# Patient Record
Sex: Female | Born: 1935 | Race: White | Hispanic: No | Smoking: Never smoker
Health system: Southern US, Community
[De-identification: ages and names within clinical notes are randomized; demographics above are authoritative.]

## PROBLEM LIST (undated history)

## (undated) DIAGNOSIS — F0390 Unspecified dementia without behavioral disturbance: Secondary | ICD-10-CM

## (undated) DIAGNOSIS — H353231 Exudative age-related macular degeneration, bilateral, with active choroidal neovascularization: Secondary | ICD-10-CM

## (undated) DIAGNOSIS — I471 Supraventricular tachycardia, unspecified: Secondary | ICD-10-CM

## (undated) DIAGNOSIS — U071 COVID-19: Secondary | ICD-10-CM

## (undated) DIAGNOSIS — I639 Cerebral infarction, unspecified: Secondary | ICD-10-CM

## (undated) DIAGNOSIS — I10 Essential (primary) hypertension: Secondary | ICD-10-CM

## (undated) DIAGNOSIS — I5189 Other ill-defined heart diseases: Secondary | ICD-10-CM

## (undated) DIAGNOSIS — E785 Hyperlipidemia, unspecified: Secondary | ICD-10-CM

## (undated) DIAGNOSIS — I4892 Unspecified atrial flutter: Secondary | ICD-10-CM

## (undated) HISTORY — DX: Unspecified atrial flutter: I48.92

## (undated) HISTORY — DX: Other ill-defined heart diseases: I51.89

## (undated) HISTORY — DX: Essential (primary) hypertension: I10

## (undated) HISTORY — DX: Hyperlipidemia, unspecified: E78.5

## (undated) HISTORY — DX: Unspecified dementia, unspecified severity, without behavioral disturbance, psychotic disturbance, mood disturbance, and anxiety: F03.90

## (undated) HISTORY — DX: Cerebral infarction, unspecified: I63.9

## (undated) HISTORY — DX: Supraventricular tachycardia: I47.1

## (undated) HISTORY — DX: Supraventricular tachycardia, unspecified: I47.10

---

## 1998-07-13 ENCOUNTER — Other Ambulatory Visit: Admission: RE | Admit: 1998-07-13 | Discharge: 1998-07-13 | Payer: Self-pay | Admitting: Obstetrics and Gynecology

## 1998-09-27 ENCOUNTER — Encounter (INDEPENDENT_AMBULATORY_CARE_PROVIDER_SITE_OTHER): Payer: Self-pay | Admitting: Specialist

## 1998-09-27 ENCOUNTER — Ambulatory Visit (HOSPITAL_COMMUNITY): Admission: RE | Admit: 1998-09-27 | Discharge: 1998-09-27 | Payer: Self-pay | Admitting: Internal Medicine

## 1999-07-25 ENCOUNTER — Encounter: Payer: Self-pay | Admitting: Obstetrics and Gynecology

## 1999-07-25 ENCOUNTER — Encounter: Admission: RE | Admit: 1999-07-25 | Discharge: 1999-07-25 | Payer: Self-pay | Admitting: Obstetrics and Gynecology

## 1999-08-14 ENCOUNTER — Other Ambulatory Visit: Admission: RE | Admit: 1999-08-14 | Discharge: 1999-08-14 | Payer: Self-pay | Admitting: Obstetrics and Gynecology

## 2000-06-13 ENCOUNTER — Ambulatory Visit (HOSPITAL_COMMUNITY): Admission: RE | Admit: 2000-06-13 | Discharge: 2000-06-13 | Payer: Self-pay | Admitting: *Deleted

## 2000-08-09 ENCOUNTER — Encounter: Payer: Self-pay | Admitting: Obstetrics and Gynecology

## 2000-08-09 ENCOUNTER — Encounter: Admission: RE | Admit: 2000-08-09 | Discharge: 2000-08-09 | Payer: Self-pay | Admitting: Obstetrics and Gynecology

## 2000-08-16 ENCOUNTER — Encounter: Admission: RE | Admit: 2000-08-16 | Discharge: 2000-08-16 | Payer: Self-pay | Admitting: Obstetrics and Gynecology

## 2000-08-16 ENCOUNTER — Encounter: Payer: Self-pay | Admitting: Obstetrics and Gynecology

## 2000-09-20 ENCOUNTER — Other Ambulatory Visit: Admission: RE | Admit: 2000-09-20 | Discharge: 2000-09-20 | Payer: Self-pay | Admitting: Obstetrics and Gynecology

## 2001-02-11 ENCOUNTER — Encounter (INDEPENDENT_AMBULATORY_CARE_PROVIDER_SITE_OTHER): Payer: Self-pay | Admitting: Specialist

## 2001-02-11 ENCOUNTER — Ambulatory Visit (HOSPITAL_COMMUNITY): Admission: RE | Admit: 2001-02-11 | Discharge: 2001-02-11 | Payer: Self-pay | Admitting: Obstetrics and Gynecology

## 2001-09-08 ENCOUNTER — Encounter: Admission: RE | Admit: 2001-09-08 | Discharge: 2001-09-08 | Payer: Self-pay | Admitting: Gynecology

## 2001-09-08 ENCOUNTER — Encounter: Payer: Self-pay | Admitting: Gynecology

## 2001-10-13 ENCOUNTER — Other Ambulatory Visit: Admission: RE | Admit: 2001-10-13 | Discharge: 2001-10-13 | Payer: Self-pay | Admitting: Gynecology

## 2002-04-17 ENCOUNTER — Encounter (INDEPENDENT_AMBULATORY_CARE_PROVIDER_SITE_OTHER): Payer: Self-pay | Admitting: Specialist

## 2002-04-17 ENCOUNTER — Ambulatory Visit (HOSPITAL_COMMUNITY): Admission: RE | Admit: 2002-04-17 | Discharge: 2002-04-17 | Payer: Self-pay | Admitting: *Deleted

## 2002-09-28 ENCOUNTER — Encounter: Payer: Self-pay | Admitting: Gynecology

## 2002-09-28 ENCOUNTER — Encounter: Admission: RE | Admit: 2002-09-28 | Discharge: 2002-09-28 | Payer: Self-pay | Admitting: Gynecology

## 2002-10-13 ENCOUNTER — Encounter: Admission: RE | Admit: 2002-10-13 | Discharge: 2002-10-13 | Payer: Self-pay | Admitting: Gynecology

## 2002-10-13 ENCOUNTER — Encounter: Payer: Self-pay | Admitting: Gynecology

## 2002-11-12 ENCOUNTER — Encounter: Admission: RE | Admit: 2002-11-12 | Discharge: 2002-12-03 | Payer: Self-pay | Admitting: Internal Medicine

## 2003-11-12 ENCOUNTER — Encounter: Admission: RE | Admit: 2003-11-12 | Discharge: 2003-11-12 | Payer: Self-pay | Admitting: Gynecology

## 2003-11-29 ENCOUNTER — Other Ambulatory Visit: Admission: RE | Admit: 2003-11-29 | Discharge: 2003-11-29 | Payer: Self-pay | Admitting: Gynecology

## 2005-01-03 ENCOUNTER — Encounter: Admission: RE | Admit: 2005-01-03 | Discharge: 2005-01-03 | Payer: Self-pay | Admitting: Internal Medicine

## 2005-01-16 ENCOUNTER — Encounter: Admission: RE | Admit: 2005-01-16 | Discharge: 2005-01-16 | Payer: Self-pay | Admitting: Internal Medicine

## 2005-08-07 ENCOUNTER — Encounter: Admission: RE | Admit: 2005-08-07 | Discharge: 2005-08-07 | Payer: Self-pay | Admitting: Internal Medicine

## 2007-08-27 ENCOUNTER — Encounter: Admission: RE | Admit: 2007-08-27 | Discharge: 2007-10-15 | Payer: Self-pay | Admitting: Internal Medicine

## 2009-09-18 ENCOUNTER — Emergency Department (HOSPITAL_COMMUNITY): Admission: EM | Admit: 2009-09-18 | Discharge: 2009-09-18 | Payer: Self-pay | Admitting: Emergency Medicine

## 2010-04-29 ENCOUNTER — Encounter: Payer: Self-pay | Admitting: Internal Medicine

## 2010-06-26 LAB — CBC
HCT: 45.3 % (ref 36.0–46.0)
Hemoglobin: 15.4 g/dL — ABNORMAL HIGH (ref 12.0–15.0)
RBC: 4.66 MIL/uL (ref 3.87–5.11)
RDW: 13.1 % (ref 11.5–15.5)
WBC: 6.8 10*3/uL (ref 4.0–10.5)

## 2010-06-26 LAB — BASIC METABOLIC PANEL
CO2: 26 mEq/L (ref 19–32)
Calcium: 8.6 mg/dL (ref 8.4–10.5)
Chloride: 106 mEq/L (ref 96–112)
GFR calc non Af Amer: >60 mL/min (ref >60)
Sodium: 139 mEq/L (ref 135–145)

## 2010-06-26 LAB — POCT CARDIAC MARKERS
CKMB, poc: 1.5 ng/mL (ref 1.0–8.0)
CKMB, poc: 1.6 ng/mL (ref 1.0–8.0)
Troponin i, poc: <0.05 ng/mL (ref 0.00–0.09)

## 2010-06-26 LAB — URINALYSIS, ROUTINE W REFLEX MICROSCOPIC
Bilirubin Urine: NEGATIVE
Glucose, UA: NEGATIVE mg/dL
Ketones, ur: 40 mg/dL — AB
Urobilinogen, UA: 0.2 mg/dL (ref 0.0–1.0)

## 2010-06-26 LAB — DIFFERENTIAL
Eosinophils Relative: 0 % (ref 0–5)
Monocytes Relative: 3 % (ref 3–12)
Neutro Abs: 5.4 10*3/uL (ref 1.7–7.7)
Neutrophils Relative %: 79 % — ABNORMAL HIGH (ref 43–77)

## 2010-08-25 NOTE — H&P (Signed)
[facility]  Patient:    Bianca [name], Bianca [name] Visit Number: [id number] MRN: [id number]          Service Type: Attending:  Harrold Donath, M.D. Dictated by:   Harrold Donath, M.D.                           History and Physical  HISTORY OF PRESENT ILLNESS:   This is a 77 year old female, para 0, who is admitted to the hospital for hysteroscopy and D&C for continuous spotting on hormones.  The patient was seen here September 20, 2000, at which time she said she had some spotting after she took Provera which had been irregular.  She had an ultrasound done on January 07, 2001 when she continued to spot.  Uterus is 5.7 x 4.3 x 5.3 and endometrial stripe was 3 mm.  She has an anterior fibroid which is calcified which measured 2.9 x 2.5.  Her right ovary is not visualized; the left ovary is not visualized.  She had had bleeding on October 02, 2000 through October 07, 2000.  She had spotting on October 13, 2000 through October 14, 2000.  She had spotting on October 16, 2000.  She had spotting on October 18, 2000 through October 25, 2000; spotting October 31, 2000 to November 02, 2000; spotting on November 21, 2000 to November 28, 2000; spotting on December 21, 2000 through December 27, 2000.  She is on Provera 1 through 10 and she was taking a half a tablet of estropipate.  She is now taking estropipate and Prometrium every day.  We attempted to sample the endometrium in the office but we were unable to pass a sound and to get a Pipelle in the endometrial cavity.  She take Tums EX and she takes vitamin E and Centrum Silver.  MEDICATIONS:                  Her drug history is as above.  SOCIAL HISTORY:               She does not smoke, she rarely uses alcohol and nutrition is good.  PHYSICAL EXAMINATION:  VITAL SIGNS:                  Her blood pressure is 130/73.  Her weight is 146 pounds.  Her height is 5 foot 1-/2 inch.  GENERAL:                      She is well-developed,  well-nourished, oriented and alert.  NECK:                         Thyroid is not palpable.  LUNGS:                        Clear to percussion and auscultation.  HEART:                        Normal sinus rhythm.  BREASTS:                      Type 4 without masses.  ABDOMEN:                      The liver is not palpated.  Spleen is not palpated.  PELVIC:  External genitalia within normal limits. Vagina:  She has an estrogen effect.  The cervix is epithelialized.  The uterus is felt to be slightly enlarged and no masses are felt in the adnexa.  IMPRESSION:                   1. Irregular postmenstrual spotting.                               2. Hormone replacement therapy.                               3. Fibroid uterus.                               4. Slight osteopenia.  DISPOSITION:                  Admit for hysteroscopy and D&C. Dictated by:   Harrold Donath, M.D. Attending:  Harrold Donath, M.D. DD:  02/10/01 TD:  02/10/01 Job: [id number]

## 2010-08-25 NOTE — Procedures (Signed)
[facility]  Patient:    Bianca [name], Bianca [name]                     MRN: [id number] Proc. Date: 06/13/00 Adm. Date:  [id number] Attending:  Jim Desanctis                           Procedure Report  PROCEDURE:  Colonoscopy.  GASTROENTEROLOGIST:  Jim Desanctis, M.D.  INDICATIONS:  Hemoccult positivity.  ANESTHESIA:  Demerol 60 mg, Versed 6 mg.  PROCEDURE IN DETAIL:  With the patient mildly sedated and in the left lateral decubitus position, the Olympus pediatric video colonoscope was inserted in the rectum and then passed under direct vision to the cecum.  The cecum was identified by ileocecal valve and appendiceal orifice, both of which were photographed.  From this point, the colonoscope was slowly withdrawn, taking circumferential views of the entire colonic mucosa, stopping first in the cecal area where a small polyp was seen at the appendiceal orifice.  It was photographed as mentioned above, and it was removed using hot biopsy forceps technique.  We eradicated all of the tissue that appeared to be adenomatous. We next stopped in the rectum where, on retroflexed view, we saw a small polyp just above the anal verge.  It, too, was photographed, and it, too, was removed using hot biopsy forceps technique at setting of 20/20 blended current.  A third and last polyp was seen in the rectum on direct view, and it, too, was removed using hot biopsy forceps technique at setting 20/20 blended current.  The endoscope was withdrawn.  The patients vital signs and pulse oximetry remained stable.  The patient tolerated the procedure well with no apparent complications.  FINDINGS: Polyps scattered throughout the colon, one in the cecum, two in the rectum.  PLAN:  Await biopsy report.  The patient will call me for results and follow up with me as an outpatient. DD:  06/13/00 TD:  06/13/00 Job: [id number]

## 2010-08-25 NOTE — Op Note (Signed)
   NAME:  Bianca [name], Bianca [name]                        ACCOUNT NO.:  [id number]   MEDICAL RECORD NO.:  [id number]                   PATIENT TYPE:  AMB   LOCATION:  ENDO                                 FACILITY:  [facility]   PHYSICIAN:  Waverly Ferrari, M.D.                 DATE OF BIRTH:  December 06, 1922   DATE OF PROCEDURE:  04/17/2002  DATE OF DISCHARGE:                                 OPERATIVE REPORT   PROCEDURE:  Upper endoscopy with biopsy.   INDICATIONS:  Hemoccult positivity.   ANESTHESIA:  Demerol 50, Versed 5 mg.   DESCRIPTION OF PROCEDURE:  With the patient mildly sedated in the left  lateral decubitus position, the Olympus videoscopic endoscope was inserted  in the mouth and passed under direct vision through the esophagus, which  appeared normal, into the stomach through a hiatal hernia sac.  Fundus, body  appeared normal.  Antrum showed changes of erosions which were photographed  and biopsied.  Second portion of the duodenum was visualized after passing  through the duodenal bulb, both of which appeared normal.  From this point,  the endoscope was slowly withdrawn, taking circumferential views of the  entire duodenal mucosa until the endoscope then pulled back into the  stomach, placed in retroflexion to view the stomach from below, and once  again the hiatal hernia was seen.  The endoscope was straightened and  withdrawn, taking circumferential views of the remaining gastric and  esophageal mucosa.  The patient's vital signs and pulse oximeter remained  stable.  The patient tolerated the procedure well without apparent  complications.   FINDINGS:  Hiatal hernia with erosions in the antrum.  Await biopsy report.  The patient will call me for results and follow up with me as an outpatient,  and this may be the cause of the patient's blood loss.                                               Waverly Ferrari, M.D.    GMO/MEDQ  D:  04/17/2002  T:  04/17/2002  Job:  [id number]

## 2010-08-25 NOTE — Op Note (Signed)
[facility]  Patient:    Bianca [name], Bianca [name] Visit Number: [id number] MRN: [id number]          Service Type: DSU Location: [facility] Attending Physician:  Perrin Smack Dictated by:   Harrold Donath, M.D. Proc. Date: 02/11/01 Admit Date:  02/11/2001                             Operative Report  SURGEON:                      Harrold Donath, M.D.  ANESTHESIA:                   General.  PACKS:                        None.  CATHETERS:                    None.  MEDIUM:                       Sorbitol.  DEFICIT:                      50 cc.  FINDINGS:                     She had am irregular endometrium.  The uterus sounded to 8 cm.  Both tubal ostia were seen.  No polyps or fibroids were seen.  It should be noted that we had a new camera, and the visualization was outstanding.  DESCRIPTION OF PROCEDURE:     The patient was carried to the operating room. After satisfactory anesthesia, she was prepped and draped as a sterile field. The bladder was emptied with catheterization.  The buttock sheet was in place and she was prepped and draped as a sterile field.  The legs were elevated and we put the plastic bag over the perineum. After things were in order, a weighted speculum was placed in the posterior vagina.  The cervix was grasped with a tenaculum and pulled forward.  The os was opened with hemostats.  The uterus then sounded easily to 8 cm.  We then dilated her to a #25 and inserted the scope.  After cleaning the uterus of blood, we were able to visualize it.  She had a lot of irregular tissue.  It was not thick and no fibroids were seen.  We did not see any polyps.  We gently curetted the endometrium and we still had some.  We looked again and no abnormalities.  The uterus was then curetted with a sharp and a serrated curet and the tissue was sent to pathology.  We looked again and still no abnormalities.  The procedure was  terminated. She was carried to the recovery room in good condition.  She will continue taking hormones, as they make her feel better, just decrease the dose. Dictated by:   Harrold Donath, M.D. Attending Physician:  Perrin Smack DD:  02/11/01 TD:  02/12/01 Job: [id number]

## 2010-12-27 ENCOUNTER — Encounter (INDEPENDENT_AMBULATORY_CARE_PROVIDER_SITE_OTHER): Payer: Medicare Other | Admitting: Ophthalmology

## 2010-12-27 DIAGNOSIS — H3530 Unspecified macular degeneration: Secondary | ICD-10-CM

## 2010-12-27 DIAGNOSIS — H353290 Exudative age-related macular degeneration, unspecified eye, stage unspecified: Secondary | ICD-10-CM

## 2010-12-27 DIAGNOSIS — H43819 Vitreous degeneration, unspecified eye: Secondary | ICD-10-CM

## 2011-02-21 ENCOUNTER — Encounter (INDEPENDENT_AMBULATORY_CARE_PROVIDER_SITE_OTHER): Payer: Medicare Other | Admitting: Ophthalmology

## 2011-02-21 DIAGNOSIS — H353290 Exudative age-related macular degeneration, unspecified eye, stage unspecified: Secondary | ICD-10-CM

## 2011-02-21 DIAGNOSIS — H43819 Vitreous degeneration, unspecified eye: Secondary | ICD-10-CM

## 2011-02-21 DIAGNOSIS — H3530 Unspecified macular degeneration: Secondary | ICD-10-CM

## 2011-04-25 ENCOUNTER — Encounter (INDEPENDENT_AMBULATORY_CARE_PROVIDER_SITE_OTHER): Payer: Medicare Other | Admitting: Ophthalmology

## 2011-04-25 DIAGNOSIS — H43819 Vitreous degeneration, unspecified eye: Secondary | ICD-10-CM | POA: Diagnosis not present

## 2011-04-25 DIAGNOSIS — H3530 Unspecified macular degeneration: Secondary | ICD-10-CM

## 2011-04-25 DIAGNOSIS — H353290 Exudative age-related macular degeneration, unspecified eye, stage unspecified: Secondary | ICD-10-CM | POA: Diagnosis not present

## 2011-06-27 ENCOUNTER — Encounter (INDEPENDENT_AMBULATORY_CARE_PROVIDER_SITE_OTHER): Payer: Medicare Other | Admitting: Ophthalmology

## 2011-06-27 DIAGNOSIS — H353290 Exudative age-related macular degeneration, unspecified eye, stage unspecified: Secondary | ICD-10-CM | POA: Diagnosis not present

## 2011-06-27 DIAGNOSIS — H43819 Vitreous degeneration, unspecified eye: Secondary | ICD-10-CM | POA: Diagnosis not present

## 2011-06-27 DIAGNOSIS — H3530 Unspecified macular degeneration: Secondary | ICD-10-CM

## 2011-06-28 DIAGNOSIS — H409 Unspecified glaucoma: Secondary | ICD-10-CM | POA: Diagnosis not present

## 2011-06-28 DIAGNOSIS — H43819 Vitreous degeneration, unspecified eye: Secondary | ICD-10-CM | POA: Diagnosis not present

## 2011-06-28 DIAGNOSIS — H26499 Other secondary cataract, unspecified eye: Secondary | ICD-10-CM | POA: Diagnosis not present

## 2011-06-28 DIAGNOSIS — H4011X0 Primary open-angle glaucoma, stage unspecified: Secondary | ICD-10-CM | POA: Diagnosis not present

## 2011-08-29 ENCOUNTER — Encounter (INDEPENDENT_AMBULATORY_CARE_PROVIDER_SITE_OTHER): Payer: Medicare Other | Admitting: Ophthalmology

## 2011-08-29 DIAGNOSIS — H43819 Vitreous degeneration, unspecified eye: Secondary | ICD-10-CM | POA: Diagnosis not present

## 2011-08-29 DIAGNOSIS — H353290 Exudative age-related macular degeneration, unspecified eye, stage unspecified: Secondary | ICD-10-CM | POA: Diagnosis not present

## 2011-08-29 DIAGNOSIS — H3530 Unspecified macular degeneration: Secondary | ICD-10-CM | POA: Diagnosis not present

## 2011-09-06 DIAGNOSIS — R5381 Other malaise: Secondary | ICD-10-CM | POA: Diagnosis not present

## 2011-09-06 DIAGNOSIS — R5383 Other fatigue: Secondary | ICD-10-CM | POA: Diagnosis not present

## 2011-09-06 DIAGNOSIS — G603 Idiopathic progressive neuropathy: Secondary | ICD-10-CM | POA: Diagnosis not present

## 2011-09-06 DIAGNOSIS — E7800 Pure hypercholesterolemia, unspecified: Secondary | ICD-10-CM | POA: Diagnosis not present

## 2011-09-06 DIAGNOSIS — M899 Disorder of bone, unspecified: Secondary | ICD-10-CM | POA: Diagnosis not present

## 2011-09-20 DIAGNOSIS — E7800 Pure hypercholesterolemia, unspecified: Secondary | ICD-10-CM | POA: Diagnosis not present

## 2011-09-20 DIAGNOSIS — R5383 Other fatigue: Secondary | ICD-10-CM | POA: Diagnosis not present

## 2011-09-20 DIAGNOSIS — M899 Disorder of bone, unspecified: Secondary | ICD-10-CM | POA: Diagnosis not present

## 2011-09-20 DIAGNOSIS — R5381 Other malaise: Secondary | ICD-10-CM | POA: Diagnosis not present

## 2011-09-20 DIAGNOSIS — M949 Disorder of cartilage, unspecified: Secondary | ICD-10-CM | POA: Diagnosis not present

## 2011-11-07 ENCOUNTER — Encounter (INDEPENDENT_AMBULATORY_CARE_PROVIDER_SITE_OTHER): Payer: Medicare Other | Admitting: Ophthalmology

## 2011-11-07 DIAGNOSIS — H3530 Unspecified macular degeneration: Secondary | ICD-10-CM

## 2011-11-07 DIAGNOSIS — H43819 Vitreous degeneration, unspecified eye: Secondary | ICD-10-CM | POA: Diagnosis not present

## 2011-11-07 DIAGNOSIS — H353290 Exudative age-related macular degeneration, unspecified eye, stage unspecified: Secondary | ICD-10-CM

## 2011-12-26 DIAGNOSIS — Z23 Encounter for immunization: Secondary | ICD-10-CM | POA: Diagnosis not present

## 2012-01-14 DIAGNOSIS — H43819 Vitreous degeneration, unspecified eye: Secondary | ICD-10-CM | POA: Diagnosis not present

## 2012-01-14 DIAGNOSIS — H4010X0 Unspecified open-angle glaucoma, stage unspecified: Secondary | ICD-10-CM | POA: Diagnosis not present

## 2012-01-14 DIAGNOSIS — H3530 Unspecified macular degeneration: Secondary | ICD-10-CM | POA: Diagnosis not present

## 2012-01-16 ENCOUNTER — Encounter (INDEPENDENT_AMBULATORY_CARE_PROVIDER_SITE_OTHER): Payer: Medicare Other | Admitting: Ophthalmology

## 2012-01-16 DIAGNOSIS — H353290 Exudative age-related macular degeneration, unspecified eye, stage unspecified: Secondary | ICD-10-CM

## 2012-01-16 DIAGNOSIS — H3530 Unspecified macular degeneration: Secondary | ICD-10-CM

## 2012-01-16 DIAGNOSIS — H43819 Vitreous degeneration, unspecified eye: Secondary | ICD-10-CM

## 2012-03-26 ENCOUNTER — Encounter (INDEPENDENT_AMBULATORY_CARE_PROVIDER_SITE_OTHER): Payer: Medicare Other | Admitting: Ophthalmology

## 2012-03-26 DIAGNOSIS — H3530 Unspecified macular degeneration: Secondary | ICD-10-CM | POA: Diagnosis not present

## 2012-03-26 DIAGNOSIS — H43819 Vitreous degeneration, unspecified eye: Secondary | ICD-10-CM

## 2012-03-26 DIAGNOSIS — H353290 Exudative age-related macular degeneration, unspecified eye, stage unspecified: Secondary | ICD-10-CM

## 2012-06-18 ENCOUNTER — Encounter (INDEPENDENT_AMBULATORY_CARE_PROVIDER_SITE_OTHER): Payer: Medicare Other | Admitting: Ophthalmology

## 2012-06-18 DIAGNOSIS — H3530 Unspecified macular degeneration: Secondary | ICD-10-CM

## 2012-06-18 DIAGNOSIS — H43819 Vitreous degeneration, unspecified eye: Secondary | ICD-10-CM

## 2012-07-09 DIAGNOSIS — H4010X0 Unspecified open-angle glaucoma, stage unspecified: Secondary | ICD-10-CM | POA: Diagnosis not present

## 2012-07-09 DIAGNOSIS — H4011X0 Primary open-angle glaucoma, stage unspecified: Secondary | ICD-10-CM | POA: Diagnosis not present

## 2012-07-09 DIAGNOSIS — H43819 Vitreous degeneration, unspecified eye: Secondary | ICD-10-CM | POA: Diagnosis not present

## 2012-07-09 DIAGNOSIS — H3530 Unspecified macular degeneration: Secondary | ICD-10-CM | POA: Diagnosis not present

## 2012-08-13 ENCOUNTER — Encounter (INDEPENDENT_AMBULATORY_CARE_PROVIDER_SITE_OTHER): Payer: Medicare Other | Admitting: Ophthalmology

## 2012-08-13 DIAGNOSIS — H3530 Unspecified macular degeneration: Secondary | ICD-10-CM

## 2012-08-13 DIAGNOSIS — H43819 Vitreous degeneration, unspecified eye: Secondary | ICD-10-CM

## 2012-09-08 DIAGNOSIS — M899 Disorder of bone, unspecified: Secondary | ICD-10-CM | POA: Diagnosis not present

## 2012-09-08 DIAGNOSIS — R5383 Other fatigue: Secondary | ICD-10-CM | POA: Diagnosis not present

## 2012-09-08 DIAGNOSIS — E7800 Pure hypercholesterolemia, unspecified: Secondary | ICD-10-CM | POA: Diagnosis not present

## 2012-09-08 DIAGNOSIS — R5381 Other malaise: Secondary | ICD-10-CM | POA: Diagnosis not present

## 2012-09-08 DIAGNOSIS — M949 Disorder of cartilage, unspecified: Secondary | ICD-10-CM | POA: Diagnosis not present

## 2012-09-11 DIAGNOSIS — Z0000 Encounter for general adult medical examination without abnormal findings: Secondary | ICD-10-CM | POA: Diagnosis not present

## 2012-10-22 ENCOUNTER — Encounter (INDEPENDENT_AMBULATORY_CARE_PROVIDER_SITE_OTHER): Payer: Medicare Other | Admitting: Ophthalmology

## 2012-10-22 DIAGNOSIS — H43819 Vitreous degeneration, unspecified eye: Secondary | ICD-10-CM

## 2012-10-22 DIAGNOSIS — H3530 Unspecified macular degeneration: Secondary | ICD-10-CM | POA: Diagnosis not present

## 2012-12-04 DIAGNOSIS — R197 Diarrhea, unspecified: Secondary | ICD-10-CM | POA: Diagnosis not present

## 2012-12-04 DIAGNOSIS — N390 Urinary tract infection, site not specified: Secondary | ICD-10-CM | POA: Diagnosis not present

## 2012-12-09 DIAGNOSIS — R197 Diarrhea, unspecified: Secondary | ICD-10-CM | POA: Diagnosis not present

## 2012-12-16 DIAGNOSIS — Z23 Encounter for immunization: Secondary | ICD-10-CM | POA: Diagnosis not present

## 2012-12-31 ENCOUNTER — Encounter (INDEPENDENT_AMBULATORY_CARE_PROVIDER_SITE_OTHER): Payer: Medicare Other | Admitting: Ophthalmology

## 2012-12-31 DIAGNOSIS — H3530 Unspecified macular degeneration: Secondary | ICD-10-CM

## 2012-12-31 DIAGNOSIS — H43819 Vitreous degeneration, unspecified eye: Secondary | ICD-10-CM

## 2013-01-08 DIAGNOSIS — H26499 Other secondary cataract, unspecified eye: Secondary | ICD-10-CM | POA: Diagnosis not present

## 2013-01-08 DIAGNOSIS — H4011X0 Primary open-angle glaucoma, stage unspecified: Secondary | ICD-10-CM | POA: Diagnosis not present

## 2013-01-08 DIAGNOSIS — H43399 Other vitreous opacities, unspecified eye: Secondary | ICD-10-CM | POA: Diagnosis not present

## 2013-01-08 DIAGNOSIS — H3530 Unspecified macular degeneration: Secondary | ICD-10-CM | POA: Diagnosis not present

## 2013-03-25 ENCOUNTER — Encounter (INDEPENDENT_AMBULATORY_CARE_PROVIDER_SITE_OTHER): Payer: Medicare Other | Admitting: Ophthalmology

## 2013-03-25 DIAGNOSIS — H3530 Unspecified macular degeneration: Secondary | ICD-10-CM

## 2013-03-25 DIAGNOSIS — H43819 Vitreous degeneration, unspecified eye: Secondary | ICD-10-CM

## 2013-07-14 DIAGNOSIS — H4011X0 Primary open-angle glaucoma, stage unspecified: Secondary | ICD-10-CM | POA: Diagnosis not present

## 2013-07-14 DIAGNOSIS — H26499 Other secondary cataract, unspecified eye: Secondary | ICD-10-CM | POA: Diagnosis not present

## 2013-07-14 DIAGNOSIS — H3530 Unspecified macular degeneration: Secondary | ICD-10-CM | POA: Diagnosis not present

## 2013-07-14 DIAGNOSIS — H43399 Other vitreous opacities, unspecified eye: Secondary | ICD-10-CM | POA: Diagnosis not present

## 2013-07-22 ENCOUNTER — Encounter (INDEPENDENT_AMBULATORY_CARE_PROVIDER_SITE_OTHER): Payer: Medicare Other | Admitting: Ophthalmology

## 2013-07-22 DIAGNOSIS — H3530 Unspecified macular degeneration: Secondary | ICD-10-CM | POA: Diagnosis not present

## 2013-07-22 DIAGNOSIS — H26499 Other secondary cataract, unspecified eye: Secondary | ICD-10-CM | POA: Diagnosis not present

## 2013-07-22 DIAGNOSIS — H43819 Vitreous degeneration, unspecified eye: Secondary | ICD-10-CM

## 2013-09-14 DIAGNOSIS — E7800 Pure hypercholesterolemia, unspecified: Secondary | ICD-10-CM | POA: Diagnosis not present

## 2013-09-14 DIAGNOSIS — G603 Idiopathic progressive neuropathy: Secondary | ICD-10-CM | POA: Diagnosis not present

## 2013-09-14 DIAGNOSIS — Z0000 Encounter for general adult medical examination without abnormal findings: Secondary | ICD-10-CM | POA: Diagnosis not present

## 2013-09-14 DIAGNOSIS — E559 Vitamin D deficiency, unspecified: Secondary | ICD-10-CM | POA: Diagnosis not present

## 2013-09-17 DIAGNOSIS — R197 Diarrhea, unspecified: Secondary | ICD-10-CM | POA: Diagnosis not present

## 2013-10-15 DIAGNOSIS — Z1331 Encounter for screening for depression: Secondary | ICD-10-CM | POA: Diagnosis not present

## 2013-10-15 DIAGNOSIS — Z0000 Encounter for general adult medical examination without abnormal findings: Secondary | ICD-10-CM | POA: Diagnosis not present

## 2013-10-15 DIAGNOSIS — E7800 Pure hypercholesterolemia, unspecified: Secondary | ICD-10-CM | POA: Diagnosis not present

## 2013-10-15 DIAGNOSIS — R197 Diarrhea, unspecified: Secondary | ICD-10-CM | POA: Diagnosis not present

## 2013-12-03 DIAGNOSIS — Z23 Encounter for immunization: Secondary | ICD-10-CM | POA: Diagnosis not present

## 2013-12-09 ENCOUNTER — Encounter (INDEPENDENT_AMBULATORY_CARE_PROVIDER_SITE_OTHER): Payer: Medicare Other | Admitting: Ophthalmology

## 2013-12-09 DIAGNOSIS — H43819 Vitreous degeneration, unspecified eye: Secondary | ICD-10-CM

## 2013-12-09 DIAGNOSIS — H3530 Unspecified macular degeneration: Secondary | ICD-10-CM

## 2014-01-06 DIAGNOSIS — R197 Diarrhea, unspecified: Secondary | ICD-10-CM | POA: Diagnosis not present

## 2014-01-06 DIAGNOSIS — Z1331 Encounter for screening for depression: Secondary | ICD-10-CM | POA: Diagnosis not present

## 2014-01-06 DIAGNOSIS — Z0000 Encounter for general adult medical examination without abnormal findings: Secondary | ICD-10-CM | POA: Diagnosis not present

## 2014-01-13 DIAGNOSIS — H4011X2 Primary open-angle glaucoma, moderate stage: Secondary | ICD-10-CM | POA: Diagnosis not present

## 2014-04-19 DIAGNOSIS — E780 Pure hypercholesterolemia: Secondary | ICD-10-CM | POA: Diagnosis not present

## 2014-04-19 DIAGNOSIS — R197 Diarrhea, unspecified: Secondary | ICD-10-CM | POA: Diagnosis not present

## 2014-04-22 DIAGNOSIS — M949 Disorder of cartilage, unspecified: Secondary | ICD-10-CM | POA: Diagnosis not present

## 2014-04-22 DIAGNOSIS — K529 Noninfective gastroenteritis and colitis, unspecified: Secondary | ICD-10-CM | POA: Diagnosis not present

## 2014-04-22 DIAGNOSIS — Z0001 Encounter for general adult medical examination with abnormal findings: Secondary | ICD-10-CM | POA: Diagnosis not present

## 2014-04-22 DIAGNOSIS — E780 Pure hypercholesterolemia: Secondary | ICD-10-CM | POA: Diagnosis not present

## 2014-05-12 ENCOUNTER — Encounter (INDEPENDENT_AMBULATORY_CARE_PROVIDER_SITE_OTHER): Payer: Medicare Other | Admitting: Ophthalmology

## 2014-05-12 DIAGNOSIS — H43813 Vitreous degeneration, bilateral: Secondary | ICD-10-CM | POA: Diagnosis not present

## 2014-05-12 DIAGNOSIS — H3532 Exudative age-related macular degeneration: Secondary | ICD-10-CM

## 2014-07-14 DIAGNOSIS — H4011X2 Primary open-angle glaucoma, moderate stage: Secondary | ICD-10-CM | POA: Diagnosis not present

## 2014-08-19 DIAGNOSIS — K529 Noninfective gastroenteritis and colitis, unspecified: Secondary | ICD-10-CM | POA: Diagnosis not present

## 2014-08-19 DIAGNOSIS — Z9181 History of falling: Secondary | ICD-10-CM | POA: Diagnosis not present

## 2014-08-19 DIAGNOSIS — Z1389 Encounter for screening for other disorder: Secondary | ICD-10-CM | POA: Diagnosis not present

## 2014-08-26 DIAGNOSIS — R2681 Unsteadiness on feet: Secondary | ICD-10-CM | POA: Diagnosis not present

## 2014-08-26 DIAGNOSIS — W010XXD Fall on same level from slipping, tripping and stumbling without subsequent striking against object, subsequent encounter: Secondary | ICD-10-CM | POA: Diagnosis not present

## 2014-08-26 DIAGNOSIS — M6281 Muscle weakness (generalized): Secondary | ICD-10-CM | POA: Diagnosis not present

## 2014-08-30 DIAGNOSIS — R2681 Unsteadiness on feet: Secondary | ICD-10-CM | POA: Diagnosis not present

## 2014-08-30 DIAGNOSIS — W010XXD Fall on same level from slipping, tripping and stumbling without subsequent striking against object, subsequent encounter: Secondary | ICD-10-CM | POA: Diagnosis not present

## 2014-08-30 DIAGNOSIS — M6281 Muscle weakness (generalized): Secondary | ICD-10-CM | POA: Diagnosis not present

## 2014-09-01 DIAGNOSIS — M6281 Muscle weakness (generalized): Secondary | ICD-10-CM | POA: Diagnosis not present

## 2014-09-01 DIAGNOSIS — W010XXD Fall on same level from slipping, tripping and stumbling without subsequent striking against object, subsequent encounter: Secondary | ICD-10-CM | POA: Diagnosis not present

## 2014-09-01 DIAGNOSIS — R2681 Unsteadiness on feet: Secondary | ICD-10-CM | POA: Diagnosis not present

## 2014-09-03 DIAGNOSIS — W010XXD Fall on same level from slipping, tripping and stumbling without subsequent striking against object, subsequent encounter: Secondary | ICD-10-CM | POA: Diagnosis not present

## 2014-09-03 DIAGNOSIS — M6281 Muscle weakness (generalized): Secondary | ICD-10-CM | POA: Diagnosis not present

## 2014-09-03 DIAGNOSIS — R2681 Unsteadiness on feet: Secondary | ICD-10-CM | POA: Diagnosis not present

## 2014-09-06 DIAGNOSIS — R2681 Unsteadiness on feet: Secondary | ICD-10-CM | POA: Diagnosis not present

## 2014-09-06 DIAGNOSIS — W010XXD Fall on same level from slipping, tripping and stumbling without subsequent striking against object, subsequent encounter: Secondary | ICD-10-CM | POA: Diagnosis not present

## 2014-09-06 DIAGNOSIS — M6281 Muscle weakness (generalized): Secondary | ICD-10-CM | POA: Diagnosis not present

## 2014-09-08 DIAGNOSIS — W010XXD Fall on same level from slipping, tripping and stumbling without subsequent striking against object, subsequent encounter: Secondary | ICD-10-CM | POA: Diagnosis not present

## 2014-09-08 DIAGNOSIS — M6281 Muscle weakness (generalized): Secondary | ICD-10-CM | POA: Diagnosis not present

## 2014-09-08 DIAGNOSIS — R2681 Unsteadiness on feet: Secondary | ICD-10-CM | POA: Diagnosis not present

## 2014-09-10 DIAGNOSIS — M6281 Muscle weakness (generalized): Secondary | ICD-10-CM | POA: Diagnosis not present

## 2014-09-10 DIAGNOSIS — R2681 Unsteadiness on feet: Secondary | ICD-10-CM | POA: Diagnosis not present

## 2014-09-10 DIAGNOSIS — W010XXD Fall on same level from slipping, tripping and stumbling without subsequent striking against object, subsequent encounter: Secondary | ICD-10-CM | POA: Diagnosis not present

## 2014-09-13 DIAGNOSIS — M6281 Muscle weakness (generalized): Secondary | ICD-10-CM | POA: Diagnosis not present

## 2014-09-13 DIAGNOSIS — R2681 Unsteadiness on feet: Secondary | ICD-10-CM | POA: Diagnosis not present

## 2014-09-13 DIAGNOSIS — W010XXD Fall on same level from slipping, tripping and stumbling without subsequent striking against object, subsequent encounter: Secondary | ICD-10-CM | POA: Diagnosis not present

## 2014-09-15 DIAGNOSIS — R2681 Unsteadiness on feet: Secondary | ICD-10-CM | POA: Diagnosis not present

## 2014-09-15 DIAGNOSIS — W010XXD Fall on same level from slipping, tripping and stumbling without subsequent striking against object, subsequent encounter: Secondary | ICD-10-CM | POA: Diagnosis not present

## 2014-09-15 DIAGNOSIS — M6281 Muscle weakness (generalized): Secondary | ICD-10-CM | POA: Diagnosis not present

## 2014-09-17 DIAGNOSIS — M6281 Muscle weakness (generalized): Secondary | ICD-10-CM | POA: Diagnosis not present

## 2014-09-17 DIAGNOSIS — W010XXD Fall on same level from slipping, tripping and stumbling without subsequent striking against object, subsequent encounter: Secondary | ICD-10-CM | POA: Diagnosis not present

## 2014-09-17 DIAGNOSIS — R2681 Unsteadiness on feet: Secondary | ICD-10-CM | POA: Diagnosis not present

## 2014-09-20 DIAGNOSIS — M6281 Muscle weakness (generalized): Secondary | ICD-10-CM | POA: Diagnosis not present

## 2014-09-20 DIAGNOSIS — W010XXD Fall on same level from slipping, tripping and stumbling without subsequent striking against object, subsequent encounter: Secondary | ICD-10-CM | POA: Diagnosis not present

## 2014-09-20 DIAGNOSIS — R2681 Unsteadiness on feet: Secondary | ICD-10-CM | POA: Diagnosis not present

## 2014-09-22 DIAGNOSIS — M6281 Muscle weakness (generalized): Secondary | ICD-10-CM | POA: Diagnosis not present

## 2014-09-22 DIAGNOSIS — R2681 Unsteadiness on feet: Secondary | ICD-10-CM | POA: Diagnosis not present

## 2014-09-22 DIAGNOSIS — W010XXD Fall on same level from slipping, tripping and stumbling without subsequent striking against object, subsequent encounter: Secondary | ICD-10-CM | POA: Diagnosis not present

## 2014-09-24 DIAGNOSIS — W010XXD Fall on same level from slipping, tripping and stumbling without subsequent striking against object, subsequent encounter: Secondary | ICD-10-CM | POA: Diagnosis not present

## 2014-09-24 DIAGNOSIS — M6281 Muscle weakness (generalized): Secondary | ICD-10-CM | POA: Diagnosis not present

## 2014-09-24 DIAGNOSIS — R2681 Unsteadiness on feet: Secondary | ICD-10-CM | POA: Diagnosis not present

## 2014-09-27 DIAGNOSIS — R2681 Unsteadiness on feet: Secondary | ICD-10-CM | POA: Diagnosis not present

## 2014-09-27 DIAGNOSIS — M6281 Muscle weakness (generalized): Secondary | ICD-10-CM | POA: Diagnosis not present

## 2014-09-27 DIAGNOSIS — W010XXD Fall on same level from slipping, tripping and stumbling without subsequent striking against object, subsequent encounter: Secondary | ICD-10-CM | POA: Diagnosis not present

## 2014-09-29 DIAGNOSIS — M6281 Muscle weakness (generalized): Secondary | ICD-10-CM | POA: Diagnosis not present

## 2014-09-29 DIAGNOSIS — R2681 Unsteadiness on feet: Secondary | ICD-10-CM | POA: Diagnosis not present

## 2014-09-29 DIAGNOSIS — W010XXD Fall on same level from slipping, tripping and stumbling without subsequent striking against object, subsequent encounter: Secondary | ICD-10-CM | POA: Diagnosis not present

## 2014-10-01 DIAGNOSIS — R2681 Unsteadiness on feet: Secondary | ICD-10-CM | POA: Diagnosis not present

## 2014-10-01 DIAGNOSIS — W010XXD Fall on same level from slipping, tripping and stumbling without subsequent striking against object, subsequent encounter: Secondary | ICD-10-CM | POA: Diagnosis not present

## 2014-10-01 DIAGNOSIS — M6281 Muscle weakness (generalized): Secondary | ICD-10-CM | POA: Diagnosis not present

## 2014-10-04 DIAGNOSIS — R2681 Unsteadiness on feet: Secondary | ICD-10-CM | POA: Diagnosis not present

## 2014-10-04 DIAGNOSIS — M6281 Muscle weakness (generalized): Secondary | ICD-10-CM | POA: Diagnosis not present

## 2014-10-04 DIAGNOSIS — W010XXD Fall on same level from slipping, tripping and stumbling without subsequent striking against object, subsequent encounter: Secondary | ICD-10-CM | POA: Diagnosis not present

## 2014-10-06 DIAGNOSIS — W010XXD Fall on same level from slipping, tripping and stumbling without subsequent striking against object, subsequent encounter: Secondary | ICD-10-CM | POA: Diagnosis not present

## 2014-10-06 DIAGNOSIS — R2681 Unsteadiness on feet: Secondary | ICD-10-CM | POA: Diagnosis not present

## 2014-10-06 DIAGNOSIS — M6281 Muscle weakness (generalized): Secondary | ICD-10-CM | POA: Diagnosis not present

## 2014-10-07 DIAGNOSIS — R2681 Unsteadiness on feet: Secondary | ICD-10-CM | POA: Diagnosis not present

## 2014-10-07 DIAGNOSIS — M6281 Muscle weakness (generalized): Secondary | ICD-10-CM | POA: Diagnosis not present

## 2014-10-07 DIAGNOSIS — W010XXD Fall on same level from slipping, tripping and stumbling without subsequent striking against object, subsequent encounter: Secondary | ICD-10-CM | POA: Diagnosis not present

## 2014-10-11 DIAGNOSIS — R2681 Unsteadiness on feet: Secondary | ICD-10-CM | POA: Diagnosis not present

## 2014-10-11 DIAGNOSIS — W010XXD Fall on same level from slipping, tripping and stumbling without subsequent striking against object, subsequent encounter: Secondary | ICD-10-CM | POA: Diagnosis not present

## 2014-10-11 DIAGNOSIS — M6281 Muscle weakness (generalized): Secondary | ICD-10-CM | POA: Diagnosis not present

## 2014-10-20 DIAGNOSIS — R2681 Unsteadiness on feet: Secondary | ICD-10-CM | POA: Diagnosis not present

## 2014-10-20 DIAGNOSIS — M6281 Muscle weakness (generalized): Secondary | ICD-10-CM | POA: Diagnosis not present

## 2014-10-20 DIAGNOSIS — W010XXD Fall on same level from slipping, tripping and stumbling without subsequent striking against object, subsequent encounter: Secondary | ICD-10-CM | POA: Diagnosis not present

## 2014-10-27 DIAGNOSIS — R2681 Unsteadiness on feet: Secondary | ICD-10-CM | POA: Diagnosis not present

## 2014-10-27 DIAGNOSIS — W010XXD Fall on same level from slipping, tripping and stumbling without subsequent striking against object, subsequent encounter: Secondary | ICD-10-CM | POA: Diagnosis not present

## 2014-10-27 DIAGNOSIS — M6281 Muscle weakness (generalized): Secondary | ICD-10-CM | POA: Diagnosis not present

## 2014-11-03 DIAGNOSIS — R2681 Unsteadiness on feet: Secondary | ICD-10-CM | POA: Diagnosis not present

## 2014-11-03 DIAGNOSIS — M6281 Muscle weakness (generalized): Secondary | ICD-10-CM | POA: Diagnosis not present

## 2014-11-03 DIAGNOSIS — W010XXD Fall on same level from slipping, tripping and stumbling without subsequent striking against object, subsequent encounter: Secondary | ICD-10-CM | POA: Diagnosis not present

## 2014-11-24 ENCOUNTER — Encounter (INDEPENDENT_AMBULATORY_CARE_PROVIDER_SITE_OTHER): Payer: Medicare Other | Admitting: Ophthalmology

## 2014-11-24 DIAGNOSIS — H3531 Nonexudative age-related macular degeneration: Secondary | ICD-10-CM

## 2014-11-24 DIAGNOSIS — H43813 Vitreous degeneration, bilateral: Secondary | ICD-10-CM | POA: Diagnosis not present

## 2014-11-24 DIAGNOSIS — H26491 Other secondary cataract, right eye: Secondary | ICD-10-CM | POA: Diagnosis not present

## 2015-01-04 DIAGNOSIS — Z23 Encounter for immunization: Secondary | ICD-10-CM | POA: Diagnosis not present

## 2015-01-12 DIAGNOSIS — H401132 Primary open-angle glaucoma, bilateral, moderate stage: Secondary | ICD-10-CM | POA: Diagnosis not present

## 2015-02-16 DIAGNOSIS — M8580 Other specified disorders of bone density and structure, unspecified site: Secondary | ICD-10-CM | POA: Diagnosis not present

## 2015-02-16 DIAGNOSIS — E7800 Pure hypercholesterolemia, unspecified: Secondary | ICD-10-CM | POA: Diagnosis not present

## 2015-02-16 DIAGNOSIS — Z0000 Encounter for general adult medical examination without abnormal findings: Secondary | ICD-10-CM | POA: Diagnosis not present

## 2015-02-16 DIAGNOSIS — Z79899 Other long term (current) drug therapy: Secondary | ICD-10-CM | POA: Diagnosis not present

## 2015-02-16 DIAGNOSIS — E559 Vitamin D deficiency, unspecified: Secondary | ICD-10-CM | POA: Diagnosis not present

## 2015-02-16 DIAGNOSIS — G603 Idiopathic progressive neuropathy: Secondary | ICD-10-CM | POA: Diagnosis not present

## 2015-02-16 DIAGNOSIS — R197 Diarrhea, unspecified: Secondary | ICD-10-CM | POA: Diagnosis not present

## 2015-02-16 DIAGNOSIS — R2681 Unsteadiness on feet: Secondary | ICD-10-CM | POA: Diagnosis not present

## 2015-02-21 DIAGNOSIS — R197 Diarrhea, unspecified: Secondary | ICD-10-CM | POA: Diagnosis not present

## 2015-03-07 DIAGNOSIS — R197 Diarrhea, unspecified: Secondary | ICD-10-CM | POA: Diagnosis not present

## 2015-03-30 DIAGNOSIS — E7800 Pure hypercholesterolemia, unspecified: Secondary | ICD-10-CM | POA: Diagnosis not present

## 2015-03-30 DIAGNOSIS — M8580 Other specified disorders of bone density and structure, unspecified site: Secondary | ICD-10-CM | POA: Diagnosis not present

## 2015-03-30 DIAGNOSIS — R197 Diarrhea, unspecified: Secondary | ICD-10-CM | POA: Diagnosis not present

## 2015-03-30 DIAGNOSIS — Z0000 Encounter for general adult medical examination without abnormal findings: Secondary | ICD-10-CM | POA: Diagnosis not present

## 2015-06-01 ENCOUNTER — Ambulatory Visit (INDEPENDENT_AMBULATORY_CARE_PROVIDER_SITE_OTHER): Payer: Medicare Other | Admitting: Ophthalmology

## 2015-06-01 DIAGNOSIS — H353221 Exudative age-related macular degeneration, left eye, with active choroidal neovascularization: Secondary | ICD-10-CM | POA: Diagnosis not present

## 2015-06-01 DIAGNOSIS — H26491 Other secondary cataract, right eye: Secondary | ICD-10-CM | POA: Diagnosis not present

## 2015-06-01 DIAGNOSIS — H43813 Vitreous degeneration, bilateral: Secondary | ICD-10-CM

## 2015-06-01 DIAGNOSIS — H353112 Nonexudative age-related macular degeneration, right eye, intermediate dry stage: Secondary | ICD-10-CM

## 2015-06-22 ENCOUNTER — Ambulatory Visit (INDEPENDENT_AMBULATORY_CARE_PROVIDER_SITE_OTHER): Payer: Medicare Other | Admitting: Ophthalmology

## 2015-06-22 DIAGNOSIS — H2701 Aphakia, right eye: Secondary | ICD-10-CM

## 2015-07-14 DIAGNOSIS — H401132 Primary open-angle glaucoma, bilateral, moderate stage: Secondary | ICD-10-CM | POA: Diagnosis not present

## 2015-10-05 DIAGNOSIS — R197 Diarrhea, unspecified: Secondary | ICD-10-CM | POA: Diagnosis not present

## 2015-10-05 DIAGNOSIS — M8580 Other specified disorders of bone density and structure, unspecified site: Secondary | ICD-10-CM | POA: Diagnosis not present

## 2015-10-05 DIAGNOSIS — Z0000 Encounter for general adult medical examination without abnormal findings: Secondary | ICD-10-CM | POA: Diagnosis not present

## 2015-10-05 DIAGNOSIS — E7800 Pure hypercholesterolemia, unspecified: Secondary | ICD-10-CM | POA: Diagnosis not present

## 2015-10-05 DIAGNOSIS — L570 Actinic keratosis: Secondary | ICD-10-CM | POA: Diagnosis not present

## 2016-01-04 ENCOUNTER — Ambulatory Visit (INDEPENDENT_AMBULATORY_CARE_PROVIDER_SITE_OTHER): Payer: Medicare Other | Admitting: Ophthalmology

## 2016-01-04 DIAGNOSIS — H353231 Exudative age-related macular degeneration, bilateral, with active choroidal neovascularization: Secondary | ICD-10-CM

## 2016-01-04 DIAGNOSIS — H43813 Vitreous degeneration, bilateral: Secondary | ICD-10-CM | POA: Diagnosis not present

## 2016-01-09 DIAGNOSIS — Z23 Encounter for immunization: Secondary | ICD-10-CM | POA: Diagnosis not present

## 2016-01-19 DIAGNOSIS — H401131 Primary open-angle glaucoma, bilateral, mild stage: Secondary | ICD-10-CM | POA: Diagnosis not present

## 2016-02-01 ENCOUNTER — Encounter (INDEPENDENT_AMBULATORY_CARE_PROVIDER_SITE_OTHER): Payer: Medicare Other | Admitting: Ophthalmology

## 2016-02-01 DIAGNOSIS — H353231 Exudative age-related macular degeneration, bilateral, with active choroidal neovascularization: Secondary | ICD-10-CM

## 2016-02-01 DIAGNOSIS — H43813 Vitreous degeneration, bilateral: Secondary | ICD-10-CM | POA: Diagnosis not present

## 2016-03-05 ENCOUNTER — Encounter (INDEPENDENT_AMBULATORY_CARE_PROVIDER_SITE_OTHER): Payer: Medicare Other | Admitting: Ophthalmology

## 2016-03-05 DIAGNOSIS — H353231 Exudative age-related macular degeneration, bilateral, with active choroidal neovascularization: Secondary | ICD-10-CM | POA: Diagnosis not present

## 2016-03-05 DIAGNOSIS — H43813 Vitreous degeneration, bilateral: Secondary | ICD-10-CM

## 2016-03-29 ENCOUNTER — Encounter (INDEPENDENT_AMBULATORY_CARE_PROVIDER_SITE_OTHER): Payer: Medicare Other | Admitting: Ophthalmology

## 2016-03-29 DIAGNOSIS — H353231 Exudative age-related macular degeneration, bilateral, with active choroidal neovascularization: Secondary | ICD-10-CM | POA: Diagnosis not present

## 2016-03-29 DIAGNOSIS — H43813 Vitreous degeneration, bilateral: Secondary | ICD-10-CM

## 2016-04-23 ENCOUNTER — Encounter (INDEPENDENT_AMBULATORY_CARE_PROVIDER_SITE_OTHER): Payer: Medicare Other | Admitting: Ophthalmology

## 2016-04-27 ENCOUNTER — Encounter (INDEPENDENT_AMBULATORY_CARE_PROVIDER_SITE_OTHER): Payer: Medicare Other | Admitting: Ophthalmology

## 2016-05-16 ENCOUNTER — Encounter (INDEPENDENT_AMBULATORY_CARE_PROVIDER_SITE_OTHER): Payer: Medicare Other | Admitting: Ophthalmology

## 2016-05-16 DIAGNOSIS — H353231 Exudative age-related macular degeneration, bilateral, with active choroidal neovascularization: Secondary | ICD-10-CM | POA: Diagnosis not present

## 2016-05-16 DIAGNOSIS — H43813 Vitreous degeneration, bilateral: Secondary | ICD-10-CM

## 2016-06-11 ENCOUNTER — Encounter (INDEPENDENT_AMBULATORY_CARE_PROVIDER_SITE_OTHER): Payer: Medicare Other | Admitting: Ophthalmology

## 2016-06-11 DIAGNOSIS — H353231 Exudative age-related macular degeneration, bilateral, with active choroidal neovascularization: Secondary | ICD-10-CM

## 2016-06-11 DIAGNOSIS — H43813 Vitreous degeneration, bilateral: Secondary | ICD-10-CM | POA: Diagnosis not present

## 2016-07-06 ENCOUNTER — Encounter (INDEPENDENT_AMBULATORY_CARE_PROVIDER_SITE_OTHER): Payer: Medicare Other | Admitting: Ophthalmology

## 2016-07-06 DIAGNOSIS — H43813 Vitreous degeneration, bilateral: Secondary | ICD-10-CM | POA: Diagnosis not present

## 2016-07-06 DIAGNOSIS — H353211 Exudative age-related macular degeneration, right eye, with active choroidal neovascularization: Secondary | ICD-10-CM | POA: Diagnosis not present

## 2016-07-06 DIAGNOSIS — H353122 Nonexudative age-related macular degeneration, left eye, intermediate dry stage: Secondary | ICD-10-CM | POA: Diagnosis not present

## 2016-07-19 DIAGNOSIS — H401131 Primary open-angle glaucoma, bilateral, mild stage: Secondary | ICD-10-CM | POA: Diagnosis not present

## 2016-07-19 DIAGNOSIS — H353230 Exudative age-related macular degeneration, bilateral, stage unspecified: Secondary | ICD-10-CM | POA: Diagnosis not present

## 2016-08-02 ENCOUNTER — Encounter (INDEPENDENT_AMBULATORY_CARE_PROVIDER_SITE_OTHER): Payer: Medicare Other | Admitting: Ophthalmology

## 2016-08-02 DIAGNOSIS — H43813 Vitreous degeneration, bilateral: Secondary | ICD-10-CM

## 2016-08-02 DIAGNOSIS — H353211 Exudative age-related macular degeneration, right eye, with active choroidal neovascularization: Secondary | ICD-10-CM | POA: Diagnosis not present

## 2016-08-02 DIAGNOSIS — H353122 Nonexudative age-related macular degeneration, left eye, intermediate dry stage: Secondary | ICD-10-CM | POA: Diagnosis not present

## 2016-08-30 ENCOUNTER — Encounter (INDEPENDENT_AMBULATORY_CARE_PROVIDER_SITE_OTHER): Payer: Medicare Other | Admitting: Ophthalmology

## 2016-08-30 DIAGNOSIS — H43813 Vitreous degeneration, bilateral: Secondary | ICD-10-CM | POA: Diagnosis not present

## 2016-08-30 DIAGNOSIS — H353122 Nonexudative age-related macular degeneration, left eye, intermediate dry stage: Secondary | ICD-10-CM

## 2016-08-30 DIAGNOSIS — H353211 Exudative age-related macular degeneration, right eye, with active choroidal neovascularization: Secondary | ICD-10-CM | POA: Diagnosis not present

## 2016-09-27 ENCOUNTER — Encounter (INDEPENDENT_AMBULATORY_CARE_PROVIDER_SITE_OTHER): Payer: Medicare Other | Admitting: Ophthalmology

## 2016-09-27 DIAGNOSIS — H43813 Vitreous degeneration, bilateral: Secondary | ICD-10-CM

## 2016-09-27 DIAGNOSIS — H353211 Exudative age-related macular degeneration, right eye, with active choroidal neovascularization: Secondary | ICD-10-CM

## 2016-09-27 DIAGNOSIS — H353122 Nonexudative age-related macular degeneration, left eye, intermediate dry stage: Secondary | ICD-10-CM | POA: Diagnosis not present

## 2016-10-05 DIAGNOSIS — Z0000 Encounter for general adult medical examination without abnormal findings: Secondary | ICD-10-CM | POA: Diagnosis not present

## 2016-10-05 DIAGNOSIS — R197 Diarrhea, unspecified: Secondary | ICD-10-CM | POA: Diagnosis not present

## 2016-10-05 DIAGNOSIS — E7800 Pure hypercholesterolemia, unspecified: Secondary | ICD-10-CM | POA: Diagnosis not present

## 2016-10-25 ENCOUNTER — Encounter (INDEPENDENT_AMBULATORY_CARE_PROVIDER_SITE_OTHER): Payer: Medicare Other | Admitting: Ophthalmology

## 2016-10-25 DIAGNOSIS — H353122 Nonexudative age-related macular degeneration, left eye, intermediate dry stage: Secondary | ICD-10-CM | POA: Diagnosis not present

## 2016-10-25 DIAGNOSIS — H353211 Exudative age-related macular degeneration, right eye, with active choroidal neovascularization: Secondary | ICD-10-CM

## 2016-10-25 DIAGNOSIS — H43813 Vitreous degeneration, bilateral: Secondary | ICD-10-CM | POA: Diagnosis not present

## 2016-11-22 ENCOUNTER — Encounter (INDEPENDENT_AMBULATORY_CARE_PROVIDER_SITE_OTHER): Payer: Medicare Other | Admitting: Ophthalmology

## 2016-11-22 DIAGNOSIS — H353231 Exudative age-related macular degeneration, bilateral, with active choroidal neovascularization: Secondary | ICD-10-CM

## 2016-11-22 DIAGNOSIS — H43813 Vitreous degeneration, bilateral: Secondary | ICD-10-CM | POA: Diagnosis not present

## 2016-12-20 ENCOUNTER — Encounter (INDEPENDENT_AMBULATORY_CARE_PROVIDER_SITE_OTHER): Payer: Medicare Other | Admitting: Ophthalmology

## 2016-12-20 DIAGNOSIS — H43813 Vitreous degeneration, bilateral: Secondary | ICD-10-CM

## 2016-12-20 DIAGNOSIS — H353211 Exudative age-related macular degeneration, right eye, with active choroidal neovascularization: Secondary | ICD-10-CM | POA: Diagnosis not present

## 2016-12-20 DIAGNOSIS — H353122 Nonexudative age-related macular degeneration, left eye, intermediate dry stage: Secondary | ICD-10-CM | POA: Diagnosis not present

## 2017-01-11 DIAGNOSIS — Z23 Encounter for immunization: Secondary | ICD-10-CM | POA: Diagnosis not present

## 2017-01-17 ENCOUNTER — Encounter (INDEPENDENT_AMBULATORY_CARE_PROVIDER_SITE_OTHER): Payer: Medicare Other | Admitting: Ophthalmology

## 2017-01-17 DIAGNOSIS — H353122 Nonexudative age-related macular degeneration, left eye, intermediate dry stage: Secondary | ICD-10-CM | POA: Diagnosis not present

## 2017-01-17 DIAGNOSIS — H353211 Exudative age-related macular degeneration, right eye, with active choroidal neovascularization: Secondary | ICD-10-CM

## 2017-01-17 DIAGNOSIS — H43813 Vitreous degeneration, bilateral: Secondary | ICD-10-CM | POA: Diagnosis not present

## 2017-02-08 ENCOUNTER — Encounter (INDEPENDENT_AMBULATORY_CARE_PROVIDER_SITE_OTHER): Payer: Medicare Other | Admitting: Ophthalmology

## 2017-02-08 DIAGNOSIS — H43813 Vitreous degeneration, bilateral: Secondary | ICD-10-CM

## 2017-02-08 DIAGNOSIS — H353231 Exudative age-related macular degeneration, bilateral, with active choroidal neovascularization: Secondary | ICD-10-CM | POA: Diagnosis not present

## 2017-02-14 DIAGNOSIS — H401131 Primary open-angle glaucoma, bilateral, mild stage: Secondary | ICD-10-CM | POA: Diagnosis not present

## 2017-02-14 DIAGNOSIS — H353230 Exudative age-related macular degeneration, bilateral, stage unspecified: Secondary | ICD-10-CM | POA: Diagnosis not present

## 2017-03-07 ENCOUNTER — Encounter (INDEPENDENT_AMBULATORY_CARE_PROVIDER_SITE_OTHER): Payer: Medicare Other | Admitting: Ophthalmology

## 2017-03-07 DIAGNOSIS — H353231 Exudative age-related macular degeneration, bilateral, with active choroidal neovascularization: Secondary | ICD-10-CM

## 2017-03-07 DIAGNOSIS — H43813 Vitreous degeneration, bilateral: Secondary | ICD-10-CM | POA: Diagnosis not present

## 2017-04-11 ENCOUNTER — Encounter (INDEPENDENT_AMBULATORY_CARE_PROVIDER_SITE_OTHER): Payer: Medicare Other | Admitting: Ophthalmology

## 2017-04-11 DIAGNOSIS — H43813 Vitreous degeneration, bilateral: Secondary | ICD-10-CM

## 2017-04-11 DIAGNOSIS — H353231 Exudative age-related macular degeneration, bilateral, with active choroidal neovascularization: Secondary | ICD-10-CM | POA: Diagnosis not present

## 2017-05-09 ENCOUNTER — Encounter (INDEPENDENT_AMBULATORY_CARE_PROVIDER_SITE_OTHER): Payer: Medicare Other | Admitting: Ophthalmology

## 2017-05-09 DIAGNOSIS — H43813 Vitreous degeneration, bilateral: Secondary | ICD-10-CM | POA: Diagnosis not present

## 2017-05-09 DIAGNOSIS — H353231 Exudative age-related macular degeneration, bilateral, with active choroidal neovascularization: Secondary | ICD-10-CM | POA: Diagnosis not present

## 2017-06-06 ENCOUNTER — Encounter (INDEPENDENT_AMBULATORY_CARE_PROVIDER_SITE_OTHER): Payer: Medicare Other | Admitting: Ophthalmology

## 2017-06-06 DIAGNOSIS — H4313 Vitreous hemorrhage, bilateral: Secondary | ICD-10-CM | POA: Diagnosis not present

## 2017-06-06 DIAGNOSIS — H353231 Exudative age-related macular degeneration, bilateral, with active choroidal neovascularization: Secondary | ICD-10-CM

## 2017-07-11 ENCOUNTER — Encounter (INDEPENDENT_AMBULATORY_CARE_PROVIDER_SITE_OTHER): Payer: Medicare Other | Admitting: Ophthalmology

## 2017-07-11 DIAGNOSIS — H43813 Vitreous degeneration, bilateral: Secondary | ICD-10-CM | POA: Diagnosis not present

## 2017-07-11 DIAGNOSIS — H353231 Exudative age-related macular degeneration, bilateral, with active choroidal neovascularization: Secondary | ICD-10-CM | POA: Diagnosis not present

## 2017-08-15 DIAGNOSIS — H353230 Exudative age-related macular degeneration, bilateral, stage unspecified: Secondary | ICD-10-CM | POA: Diagnosis not present

## 2017-08-15 DIAGNOSIS — H401131 Primary open-angle glaucoma, bilateral, mild stage: Secondary | ICD-10-CM | POA: Diagnosis not present

## 2017-08-16 ENCOUNTER — Encounter (INDEPENDENT_AMBULATORY_CARE_PROVIDER_SITE_OTHER): Payer: Medicare Other | Admitting: Ophthalmology

## 2017-08-16 DIAGNOSIS — H43813 Vitreous degeneration, bilateral: Secondary | ICD-10-CM

## 2017-08-16 DIAGNOSIS — H353231 Exudative age-related macular degeneration, bilateral, with active choroidal neovascularization: Secondary | ICD-10-CM

## 2017-09-19 ENCOUNTER — Encounter (INDEPENDENT_AMBULATORY_CARE_PROVIDER_SITE_OTHER): Payer: Medicare Other | Admitting: Ophthalmology

## 2017-09-19 DIAGNOSIS — H353231 Exudative age-related macular degeneration, bilateral, with active choroidal neovascularization: Secondary | ICD-10-CM

## 2017-09-19 DIAGNOSIS — H43813 Vitreous degeneration, bilateral: Secondary | ICD-10-CM | POA: Diagnosis not present

## 2017-10-07 DIAGNOSIS — Z0000 Encounter for general adult medical examination without abnormal findings: Secondary | ICD-10-CM | POA: Diagnosis not present

## 2017-10-07 DIAGNOSIS — R197 Diarrhea, unspecified: Secondary | ICD-10-CM | POA: Diagnosis not present

## 2017-10-07 DIAGNOSIS — H3530 Unspecified macular degeneration: Secondary | ICD-10-CM | POA: Diagnosis not present

## 2017-10-07 DIAGNOSIS — E7800 Pure hypercholesterolemia, unspecified: Secondary | ICD-10-CM | POA: Diagnosis not present

## 2017-10-24 ENCOUNTER — Encounter (INDEPENDENT_AMBULATORY_CARE_PROVIDER_SITE_OTHER): Payer: Medicare Other | Admitting: Ophthalmology

## 2017-10-24 DIAGNOSIS — H353231 Exudative age-related macular degeneration, bilateral, with active choroidal neovascularization: Secondary | ICD-10-CM | POA: Diagnosis not present

## 2017-10-24 DIAGNOSIS — H43813 Vitreous degeneration, bilateral: Secondary | ICD-10-CM | POA: Diagnosis not present

## 2017-11-28 ENCOUNTER — Encounter (INDEPENDENT_AMBULATORY_CARE_PROVIDER_SITE_OTHER): Payer: Medicare Other | Admitting: Ophthalmology

## 2017-11-28 DIAGNOSIS — H353231 Exudative age-related macular degeneration, bilateral, with active choroidal neovascularization: Secondary | ICD-10-CM | POA: Diagnosis not present

## 2017-11-28 DIAGNOSIS — H43813 Vitreous degeneration, bilateral: Secondary | ICD-10-CM

## 2018-01-02 ENCOUNTER — Encounter (INDEPENDENT_AMBULATORY_CARE_PROVIDER_SITE_OTHER): Payer: Medicare Other | Admitting: Ophthalmology

## 2018-01-02 DIAGNOSIS — H353231 Exudative age-related macular degeneration, bilateral, with active choroidal neovascularization: Secondary | ICD-10-CM | POA: Diagnosis not present

## 2018-01-02 DIAGNOSIS — H43813 Vitreous degeneration, bilateral: Secondary | ICD-10-CM

## 2018-02-06 ENCOUNTER — Encounter (INDEPENDENT_AMBULATORY_CARE_PROVIDER_SITE_OTHER): Payer: Medicare Other | Admitting: Ophthalmology

## 2018-02-06 DIAGNOSIS — H43813 Vitreous degeneration, bilateral: Secondary | ICD-10-CM

## 2018-02-06 DIAGNOSIS — H353231 Exudative age-related macular degeneration, bilateral, with active choroidal neovascularization: Secondary | ICD-10-CM

## 2018-03-13 ENCOUNTER — Encounter (INDEPENDENT_AMBULATORY_CARE_PROVIDER_SITE_OTHER): Payer: Medicare Other | Admitting: Ophthalmology

## 2018-03-13 DIAGNOSIS — H43813 Vitreous degeneration, bilateral: Secondary | ICD-10-CM

## 2018-03-13 DIAGNOSIS — H353231 Exudative age-related macular degeneration, bilateral, with active choroidal neovascularization: Secondary | ICD-10-CM | POA: Diagnosis not present

## 2018-04-18 ENCOUNTER — Encounter (INDEPENDENT_AMBULATORY_CARE_PROVIDER_SITE_OTHER): Payer: Medicare Other | Admitting: Ophthalmology

## 2018-04-18 DIAGNOSIS — H353231 Exudative age-related macular degeneration, bilateral, with active choroidal neovascularization: Secondary | ICD-10-CM

## 2018-04-18 DIAGNOSIS — H43813 Vitreous degeneration, bilateral: Secondary | ICD-10-CM

## 2018-05-02 DIAGNOSIS — H353230 Exudative age-related macular degeneration, bilateral, stage unspecified: Secondary | ICD-10-CM | POA: Diagnosis not present

## 2018-05-02 DIAGNOSIS — H401131 Primary open-angle glaucoma, bilateral, mild stage: Secondary | ICD-10-CM | POA: Diagnosis not present

## 2018-05-22 ENCOUNTER — Encounter (INDEPENDENT_AMBULATORY_CARE_PROVIDER_SITE_OTHER): Payer: Medicare Other | Admitting: Ophthalmology

## 2018-05-22 DIAGNOSIS — H353231 Exudative age-related macular degeneration, bilateral, with active choroidal neovascularization: Secondary | ICD-10-CM | POA: Diagnosis not present

## 2018-05-22 DIAGNOSIS — H43813 Vitreous degeneration, bilateral: Secondary | ICD-10-CM | POA: Diagnosis not present

## 2018-06-25 ENCOUNTER — Encounter (INDEPENDENT_AMBULATORY_CARE_PROVIDER_SITE_OTHER): Payer: Medicare Other | Admitting: Ophthalmology

## 2018-06-25 ENCOUNTER — Other Ambulatory Visit: Payer: Self-pay

## 2018-06-25 DIAGNOSIS — H353231 Exudative age-related macular degeneration, bilateral, with active choroidal neovascularization: Secondary | ICD-10-CM

## 2018-06-25 DIAGNOSIS — H43813 Vitreous degeneration, bilateral: Secondary | ICD-10-CM

## 2018-07-31 ENCOUNTER — Encounter (INDEPENDENT_AMBULATORY_CARE_PROVIDER_SITE_OTHER): Payer: Medicare Other | Admitting: Ophthalmology

## 2018-08-04 ENCOUNTER — Other Ambulatory Visit: Payer: Self-pay

## 2018-08-04 ENCOUNTER — Encounter (INDEPENDENT_AMBULATORY_CARE_PROVIDER_SITE_OTHER): Payer: Medicare Other | Admitting: Ophthalmology

## 2018-08-04 DIAGNOSIS — H43813 Vitreous degeneration, bilateral: Secondary | ICD-10-CM

## 2018-08-04 DIAGNOSIS — H353231 Exudative age-related macular degeneration, bilateral, with active choroidal neovascularization: Secondary | ICD-10-CM | POA: Diagnosis not present

## 2018-09-02 ENCOUNTER — Other Ambulatory Visit: Payer: Self-pay

## 2018-09-02 ENCOUNTER — Encounter (INDEPENDENT_AMBULATORY_CARE_PROVIDER_SITE_OTHER): Payer: Medicare Other | Admitting: Ophthalmology

## 2018-09-02 DIAGNOSIS — H43813 Vitreous degeneration, bilateral: Secondary | ICD-10-CM | POA: Diagnosis not present

## 2018-09-02 DIAGNOSIS — H353231 Exudative age-related macular degeneration, bilateral, with active choroidal neovascularization: Secondary | ICD-10-CM | POA: Diagnosis not present

## 2018-09-30 ENCOUNTER — Encounter (INDEPENDENT_AMBULATORY_CARE_PROVIDER_SITE_OTHER): Payer: Medicare Other | Admitting: Ophthalmology

## 2018-09-30 ENCOUNTER — Other Ambulatory Visit: Payer: Self-pay

## 2018-09-30 DIAGNOSIS — H43813 Vitreous degeneration, bilateral: Secondary | ICD-10-CM

## 2018-09-30 DIAGNOSIS — H353231 Exudative age-related macular degeneration, bilateral, with active choroidal neovascularization: Secondary | ICD-10-CM

## 2018-10-16 DIAGNOSIS — Z0000 Encounter for general adult medical examination without abnormal findings: Secondary | ICD-10-CM | POA: Diagnosis not present

## 2018-10-16 DIAGNOSIS — H3530 Unspecified macular degeneration: Secondary | ICD-10-CM | POA: Diagnosis not present

## 2018-10-16 DIAGNOSIS — G603 Idiopathic progressive neuropathy: Secondary | ICD-10-CM | POA: Diagnosis not present

## 2018-10-16 DIAGNOSIS — E7800 Pure hypercholesterolemia, unspecified: Secondary | ICD-10-CM | POA: Diagnosis not present

## 2018-10-16 DIAGNOSIS — R197 Diarrhea, unspecified: Secondary | ICD-10-CM | POA: Diagnosis not present

## 2018-10-28 ENCOUNTER — Encounter (INDEPENDENT_AMBULATORY_CARE_PROVIDER_SITE_OTHER): Payer: Medicare Other | Admitting: Ophthalmology

## 2018-10-28 ENCOUNTER — Other Ambulatory Visit: Payer: Self-pay

## 2018-10-28 DIAGNOSIS — H43813 Vitreous degeneration, bilateral: Secondary | ICD-10-CM | POA: Diagnosis not present

## 2018-10-28 DIAGNOSIS — H353231 Exudative age-related macular degeneration, bilateral, with active choroidal neovascularization: Secondary | ICD-10-CM | POA: Diagnosis not present

## 2018-11-25 ENCOUNTER — Encounter (INDEPENDENT_AMBULATORY_CARE_PROVIDER_SITE_OTHER): Payer: Medicare Other | Admitting: Ophthalmology

## 2018-12-01 ENCOUNTER — Encounter (INDEPENDENT_AMBULATORY_CARE_PROVIDER_SITE_OTHER): Payer: Medicare Other | Admitting: Ophthalmology

## 2018-12-01 ENCOUNTER — Other Ambulatory Visit: Payer: Self-pay

## 2018-12-01 DIAGNOSIS — H43813 Vitreous degeneration, bilateral: Secondary | ICD-10-CM

## 2018-12-01 DIAGNOSIS — H353231 Exudative age-related macular degeneration, bilateral, with active choroidal neovascularization: Secondary | ICD-10-CM | POA: Diagnosis not present

## 2019-01-01 ENCOUNTER — Other Ambulatory Visit: Payer: Self-pay

## 2019-01-01 ENCOUNTER — Encounter (INDEPENDENT_AMBULATORY_CARE_PROVIDER_SITE_OTHER): Payer: Medicare Other | Admitting: Ophthalmology

## 2019-01-01 DIAGNOSIS — H353231 Exudative age-related macular degeneration, bilateral, with active choroidal neovascularization: Secondary | ICD-10-CM | POA: Diagnosis not present

## 2019-01-01 DIAGNOSIS — H43813 Vitreous degeneration, bilateral: Secondary | ICD-10-CM | POA: Diagnosis not present

## 2019-02-03 ENCOUNTER — Other Ambulatory Visit: Payer: Self-pay

## 2019-02-03 ENCOUNTER — Encounter (INDEPENDENT_AMBULATORY_CARE_PROVIDER_SITE_OTHER): Payer: Medicare Other | Admitting: Ophthalmology

## 2019-02-03 DIAGNOSIS — H43813 Vitreous degeneration, bilateral: Secondary | ICD-10-CM | POA: Diagnosis not present

## 2019-02-03 DIAGNOSIS — H353231 Exudative age-related macular degeneration, bilateral, with active choroidal neovascularization: Secondary | ICD-10-CM

## 2019-02-24 DIAGNOSIS — H353230 Exudative age-related macular degeneration, bilateral, stage unspecified: Secondary | ICD-10-CM | POA: Diagnosis not present

## 2019-02-24 DIAGNOSIS — H401131 Primary open-angle glaucoma, bilateral, mild stage: Secondary | ICD-10-CM | POA: Diagnosis not present

## 2019-03-03 ENCOUNTER — Encounter (INDEPENDENT_AMBULATORY_CARE_PROVIDER_SITE_OTHER): Payer: Medicare Other | Admitting: Ophthalmology

## 2019-03-03 DIAGNOSIS — H43813 Vitreous degeneration, bilateral: Secondary | ICD-10-CM

## 2019-03-03 DIAGNOSIS — H353231 Exudative age-related macular degeneration, bilateral, with active choroidal neovascularization: Secondary | ICD-10-CM | POA: Diagnosis not present

## 2019-03-17 DIAGNOSIS — R2689 Other abnormalities of gait and mobility: Secondary | ICD-10-CM | POA: Diagnosis not present

## 2019-03-17 DIAGNOSIS — W010XXD Fall on same level from slipping, tripping and stumbling without subsequent striking against object, subsequent encounter: Secondary | ICD-10-CM | POA: Diagnosis not present

## 2019-03-17 DIAGNOSIS — Z7389 Other problems related to life management difficulty: Secondary | ICD-10-CM | POA: Diagnosis not present

## 2019-03-17 DIAGNOSIS — R32 Unspecified urinary incontinence: Secondary | ICD-10-CM | POA: Diagnosis not present

## 2019-03-17 DIAGNOSIS — H25013 Cortical age-related cataract, bilateral: Secondary | ICD-10-CM | POA: Diagnosis not present

## 2019-03-17 DIAGNOSIS — B029 Zoster without complications: Secondary | ICD-10-CM | POA: Diagnosis not present

## 2019-03-17 DIAGNOSIS — M6281 Muscle weakness (generalized): Secondary | ICD-10-CM | POA: Diagnosis not present

## 2019-03-17 DIAGNOSIS — H409 Unspecified glaucoma: Secondary | ICD-10-CM | POA: Diagnosis not present

## 2019-03-17 DIAGNOSIS — N951 Menopausal and female climacteric states: Secondary | ICD-10-CM | POA: Diagnosis not present

## 2019-03-17 DIAGNOSIS — H353230 Exudative age-related macular degeneration, bilateral, stage unspecified: Secondary | ICD-10-CM | POA: Diagnosis not present

## 2019-03-17 DIAGNOSIS — R55 Syncope and collapse: Secondary | ICD-10-CM | POA: Diagnosis not present

## 2019-03-18 DIAGNOSIS — M6281 Muscle weakness (generalized): Secondary | ICD-10-CM | POA: Diagnosis not present

## 2019-03-18 DIAGNOSIS — H353230 Exudative age-related macular degeneration, bilateral, stage unspecified: Secondary | ICD-10-CM | POA: Diagnosis not present

## 2019-03-18 DIAGNOSIS — R2689 Other abnormalities of gait and mobility: Secondary | ICD-10-CM | POA: Diagnosis not present

## 2019-03-18 DIAGNOSIS — Z7389 Other problems related to life management difficulty: Secondary | ICD-10-CM | POA: Diagnosis not present

## 2019-03-18 DIAGNOSIS — R32 Unspecified urinary incontinence: Secondary | ICD-10-CM | POA: Diagnosis not present

## 2019-03-18 DIAGNOSIS — H409 Unspecified glaucoma: Secondary | ICD-10-CM | POA: Diagnosis not present

## 2019-03-20 DIAGNOSIS — Z7389 Other problems related to life management difficulty: Secondary | ICD-10-CM | POA: Diagnosis not present

## 2019-03-20 DIAGNOSIS — R32 Unspecified urinary incontinence: Secondary | ICD-10-CM | POA: Diagnosis not present

## 2019-03-20 DIAGNOSIS — H409 Unspecified glaucoma: Secondary | ICD-10-CM | POA: Diagnosis not present

## 2019-03-20 DIAGNOSIS — H353230 Exudative age-related macular degeneration, bilateral, stage unspecified: Secondary | ICD-10-CM | POA: Diagnosis not present

## 2019-03-20 DIAGNOSIS — R2689 Other abnormalities of gait and mobility: Secondary | ICD-10-CM | POA: Diagnosis not present

## 2019-03-20 DIAGNOSIS — M6281 Muscle weakness (generalized): Secondary | ICD-10-CM | POA: Diagnosis not present

## 2019-03-23 DIAGNOSIS — H409 Unspecified glaucoma: Secondary | ICD-10-CM | POA: Diagnosis not present

## 2019-03-23 DIAGNOSIS — R2689 Other abnormalities of gait and mobility: Secondary | ICD-10-CM | POA: Diagnosis not present

## 2019-03-23 DIAGNOSIS — R32 Unspecified urinary incontinence: Secondary | ICD-10-CM | POA: Diagnosis not present

## 2019-03-23 DIAGNOSIS — H353230 Exudative age-related macular degeneration, bilateral, stage unspecified: Secondary | ICD-10-CM | POA: Diagnosis not present

## 2019-03-23 DIAGNOSIS — Z7389 Other problems related to life management difficulty: Secondary | ICD-10-CM | POA: Diagnosis not present

## 2019-03-23 DIAGNOSIS — M6281 Muscle weakness (generalized): Secondary | ICD-10-CM | POA: Diagnosis not present

## 2019-03-24 DIAGNOSIS — R32 Unspecified urinary incontinence: Secondary | ICD-10-CM | POA: Diagnosis not present

## 2019-03-24 DIAGNOSIS — H409 Unspecified glaucoma: Secondary | ICD-10-CM | POA: Diagnosis not present

## 2019-03-24 DIAGNOSIS — H353230 Exudative age-related macular degeneration, bilateral, stage unspecified: Secondary | ICD-10-CM | POA: Diagnosis not present

## 2019-03-24 DIAGNOSIS — Z7389 Other problems related to life management difficulty: Secondary | ICD-10-CM | POA: Diagnosis not present

## 2019-03-24 DIAGNOSIS — R2689 Other abnormalities of gait and mobility: Secondary | ICD-10-CM | POA: Diagnosis not present

## 2019-03-24 DIAGNOSIS — M6281 Muscle weakness (generalized): Secondary | ICD-10-CM | POA: Diagnosis not present

## 2019-03-25 DIAGNOSIS — M6281 Muscle weakness (generalized): Secondary | ICD-10-CM | POA: Diagnosis not present

## 2019-03-25 DIAGNOSIS — H353230 Exudative age-related macular degeneration, bilateral, stage unspecified: Secondary | ICD-10-CM | POA: Diagnosis not present

## 2019-03-25 DIAGNOSIS — R32 Unspecified urinary incontinence: Secondary | ICD-10-CM | POA: Diagnosis not present

## 2019-03-25 DIAGNOSIS — H409 Unspecified glaucoma: Secondary | ICD-10-CM | POA: Diagnosis not present

## 2019-03-25 DIAGNOSIS — R2689 Other abnormalities of gait and mobility: Secondary | ICD-10-CM | POA: Diagnosis not present

## 2019-03-25 DIAGNOSIS — Z7389 Other problems related to life management difficulty: Secondary | ICD-10-CM | POA: Diagnosis not present

## 2019-03-26 DIAGNOSIS — R2689 Other abnormalities of gait and mobility: Secondary | ICD-10-CM | POA: Diagnosis not present

## 2019-03-26 DIAGNOSIS — R32 Unspecified urinary incontinence: Secondary | ICD-10-CM | POA: Diagnosis not present

## 2019-03-26 DIAGNOSIS — H409 Unspecified glaucoma: Secondary | ICD-10-CM | POA: Diagnosis not present

## 2019-03-26 DIAGNOSIS — Z7389 Other problems related to life management difficulty: Secondary | ICD-10-CM | POA: Diagnosis not present

## 2019-03-26 DIAGNOSIS — M6281 Muscle weakness (generalized): Secondary | ICD-10-CM | POA: Diagnosis not present

## 2019-03-26 DIAGNOSIS — H353230 Exudative age-related macular degeneration, bilateral, stage unspecified: Secondary | ICD-10-CM | POA: Diagnosis not present

## 2019-03-27 DIAGNOSIS — H353230 Exudative age-related macular degeneration, bilateral, stage unspecified: Secondary | ICD-10-CM | POA: Diagnosis not present

## 2019-03-27 DIAGNOSIS — R2689 Other abnormalities of gait and mobility: Secondary | ICD-10-CM | POA: Diagnosis not present

## 2019-03-27 DIAGNOSIS — M6281 Muscle weakness (generalized): Secondary | ICD-10-CM | POA: Diagnosis not present

## 2019-03-27 DIAGNOSIS — Z7389 Other problems related to life management difficulty: Secondary | ICD-10-CM | POA: Diagnosis not present

## 2019-03-27 DIAGNOSIS — R32 Unspecified urinary incontinence: Secondary | ICD-10-CM | POA: Diagnosis not present

## 2019-03-27 DIAGNOSIS — H409 Unspecified glaucoma: Secondary | ICD-10-CM | POA: Diagnosis not present

## 2019-03-30 DIAGNOSIS — H353230 Exudative age-related macular degeneration, bilateral, stage unspecified: Secondary | ICD-10-CM | POA: Diagnosis not present

## 2019-03-30 DIAGNOSIS — R2689 Other abnormalities of gait and mobility: Secondary | ICD-10-CM | POA: Diagnosis not present

## 2019-03-30 DIAGNOSIS — Z7389 Other problems related to life management difficulty: Secondary | ICD-10-CM | POA: Diagnosis not present

## 2019-03-30 DIAGNOSIS — M6281 Muscle weakness (generalized): Secondary | ICD-10-CM | POA: Diagnosis not present

## 2019-03-30 DIAGNOSIS — R32 Unspecified urinary incontinence: Secondary | ICD-10-CM | POA: Diagnosis not present

## 2019-03-30 DIAGNOSIS — H409 Unspecified glaucoma: Secondary | ICD-10-CM | POA: Diagnosis not present

## 2019-03-31 ENCOUNTER — Encounter (INDEPENDENT_AMBULATORY_CARE_PROVIDER_SITE_OTHER): Payer: Medicare Other | Admitting: Ophthalmology

## 2019-03-31 DIAGNOSIS — H353231 Exudative age-related macular degeneration, bilateral, with active choroidal neovascularization: Secondary | ICD-10-CM

## 2019-03-31 DIAGNOSIS — H43813 Vitreous degeneration, bilateral: Secondary | ICD-10-CM | POA: Diagnosis not present

## 2019-04-01 DIAGNOSIS — H409 Unspecified glaucoma: Secondary | ICD-10-CM | POA: Diagnosis not present

## 2019-04-01 DIAGNOSIS — M6281 Muscle weakness (generalized): Secondary | ICD-10-CM | POA: Diagnosis not present

## 2019-04-01 DIAGNOSIS — R2689 Other abnormalities of gait and mobility: Secondary | ICD-10-CM | POA: Diagnosis not present

## 2019-04-01 DIAGNOSIS — R32 Unspecified urinary incontinence: Secondary | ICD-10-CM | POA: Diagnosis not present

## 2019-04-01 DIAGNOSIS — Z7389 Other problems related to life management difficulty: Secondary | ICD-10-CM | POA: Diagnosis not present

## 2019-04-01 DIAGNOSIS — H353230 Exudative age-related macular degeneration, bilateral, stage unspecified: Secondary | ICD-10-CM | POA: Diagnosis not present

## 2019-04-20 ENCOUNTER — Emergency Department (HOSPITAL_COMMUNITY)
Admission: EM | Admit: 2019-04-20 | Discharge: 2019-04-20 | Disposition: A | Discharge status: Home / Self Care | Payer: Medicare Other | Attending: Emergency Medicine | Admitting: Emergency Medicine

## 2019-04-20 ENCOUNTER — Encounter (HOSPITAL_COMMUNITY): Payer: Self-pay | Admitting: Emergency Medicine

## 2019-04-20 ENCOUNTER — Emergency Department (HOSPITAL_COMMUNITY): Payer: Medicare Other

## 2019-04-20 ENCOUNTER — Other Ambulatory Visit: Payer: Self-pay

## 2019-04-20 DIAGNOSIS — R2681 Unsteadiness on feet: Secondary | ICD-10-CM | POA: Diagnosis not present

## 2019-04-20 DIAGNOSIS — U071 COVID-19: Secondary | ICD-10-CM | POA: Diagnosis not present

## 2019-04-20 DIAGNOSIS — J1282 Pneumonia due to coronavirus disease 2019: Secondary | ICD-10-CM | POA: Diagnosis not present

## 2019-04-20 DIAGNOSIS — H40009 Preglaucoma, unspecified, unspecified eye: Secondary | ICD-10-CM | POA: Diagnosis not present

## 2019-04-20 DIAGNOSIS — R279 Unspecified lack of coordination: Secondary | ICD-10-CM | POA: Diagnosis not present

## 2019-04-20 DIAGNOSIS — H353220 Exudative age-related macular degeneration, left eye, stage unspecified: Secondary | ICD-10-CM | POA: Diagnosis not present

## 2019-04-20 DIAGNOSIS — R0789 Other chest pain: Secondary | ICD-10-CM | POA: Diagnosis not present

## 2019-04-20 DIAGNOSIS — K529 Noninfective gastroenteritis and colitis, unspecified: Secondary | ICD-10-CM | POA: Diagnosis not present

## 2019-04-20 DIAGNOSIS — R0902 Hypoxemia: Secondary | ICD-10-CM | POA: Diagnosis not present

## 2019-04-20 DIAGNOSIS — R000 Tachycardia, unspecified: Secondary | ICD-10-CM | POA: Diagnosis not present

## 2019-04-20 DIAGNOSIS — I471 Supraventricular tachycardia: Secondary | ICD-10-CM | POA: Diagnosis not present

## 2019-04-20 DIAGNOSIS — R32 Unspecified urinary incontinence: Secondary | ICD-10-CM | POA: Diagnosis not present

## 2019-04-20 DIAGNOSIS — Z7401 Bed confinement status: Secondary | ICD-10-CM | POA: Diagnosis not present

## 2019-04-20 DIAGNOSIS — N951 Menopausal and female climacteric states: Secondary | ICD-10-CM | POA: Diagnosis not present

## 2019-04-20 DIAGNOSIS — Z743 Need for continuous supervision: Secondary | ICD-10-CM | POA: Diagnosis not present

## 2019-04-20 DIAGNOSIS — R079 Chest pain, unspecified: Secondary | ICD-10-CM | POA: Diagnosis not present

## 2019-04-20 DIAGNOSIS — W010XXD Fall on same level from slipping, tripping and stumbling without subsequent striking against object, subsequent encounter: Secondary | ICD-10-CM | POA: Diagnosis not present

## 2019-04-20 DIAGNOSIS — M6281 Muscle weakness (generalized): Secondary | ICD-10-CM | POA: Diagnosis not present

## 2019-04-20 DIAGNOSIS — R29818 Other symptoms and signs involving the nervous system: Secondary | ICD-10-CM | POA: Diagnosis not present

## 2019-04-20 DIAGNOSIS — R55 Syncope and collapse: Secondary | ICD-10-CM | POA: Diagnosis not present

## 2019-04-20 DIAGNOSIS — H25013 Cortical age-related cataract, bilateral: Secondary | ICD-10-CM | POA: Diagnosis not present

## 2019-04-20 DIAGNOSIS — M2550 Pain in unspecified joint: Secondary | ICD-10-CM | POA: Diagnosis not present

## 2019-04-20 DIAGNOSIS — Z7982 Long term (current) use of aspirin: Secondary | ICD-10-CM | POA: Diagnosis not present

## 2019-04-20 DIAGNOSIS — R42 Dizziness and giddiness: Secondary | ICD-10-CM | POA: Diagnosis not present

## 2019-04-20 DIAGNOSIS — B029 Zoster without complications: Secondary | ICD-10-CM | POA: Diagnosis not present

## 2019-04-20 DIAGNOSIS — Z20822 Contact with and (suspected) exposure to covid-19: Secondary | ICD-10-CM | POA: Diagnosis not present

## 2019-04-20 HISTORY — DX: Exudative age-related macular degeneration, bilateral, with active choroidal neovascularization: H35.3231

## 2019-04-20 HISTORY — DX: COVID-19: U07.1

## 2019-04-20 LAB — BASIC METABOLIC PANEL
Anion gap: 11 (ref 5–15)
BUN: 15 mg/dL (ref 8–23)
CO2: 27 mmol/L (ref 22–32)
Calcium: 8 mg/dL — ABNORMAL LOW (ref 8.9–10.3)
Chloride: 105 mmol/L (ref 98–111)
Creatinine, Ser: 0.96 mg/dL (ref 0.44–1.00)
GFR calc Af Amer: 59 mL/min — ABNORMAL LOW (ref >60)
GFR calc non Af Amer: 51 mL/min — ABNORMAL LOW (ref >60)
Glucose, Bld: 91 mg/dL (ref 70–99)
Potassium: 4 mmol/L (ref 3.5–5.1)
Sodium: 143 mmol/L (ref 135–145)

## 2019-04-20 LAB — CBC WITH DIFFERENTIAL/PLATELET
Abs Immature Granulocytes: 0.06 10*3/uL (ref 0.00–0.07)
Basophils Absolute: 0 10*3/uL (ref 0.0–0.1)
Basophils Relative: 0 %
Eosinophils Absolute: 0 10*3/uL (ref 0.0–0.5)
Eosinophils Relative: 0 %
HCT: 43.5 % (ref 36.0–46.0)
Hemoglobin: 14 g/dL (ref 12.0–15.0)
Immature Granulocytes: 1 %
Lymphocytes Relative: 8 %
Lymphs Abs: 0.7 10*3/uL (ref 0.7–4.0)
MCH: 31.6 pg (ref 26.0–34.0)
MCHC: 32.2 g/dL (ref 30.0–36.0)
MCV: 98.2 fL (ref 80.0–100.0)
Monocytes Absolute: 0.3 10*3/uL (ref 0.1–1.0)
Monocytes Relative: 3 %
Neutro Abs: 7.1 10*3/uL (ref 1.7–7.7)
Neutrophils Relative %: 88 %
Platelets: 322 10*3/uL (ref 150–400)
RBC: 4.43 MIL/uL (ref 3.87–5.11)
RDW: 12.9 % (ref 11.5–15.5)
WBC: 8.1 10*3/uL (ref 4.0–10.5)
nRBC: 0 % (ref 0.0–0.2)

## 2019-04-20 MED ORDER — SODIUM CHLORIDE 0.9 % IV BOLUS
500.0000 mL | Freq: Once | INTRAVENOUS | Status: AC
  Administered 2019-04-20: 500 mL via INTRAVENOUS

## 2019-04-20 NOTE — ED Triage Notes (Signed)
To ED via [facility] from [facility] positive 2 weeks ago-- woke up with chest pain, rapid heart beat this am.  Pt is alert/oriented x 4, normally ambulates without difficulty with a walker.

## 2019-04-20 NOTE — ED Notes (Signed)
Pt given sandwich 

## 2019-04-20 NOTE — Discharge Instructions (Addendum)
It is important you stay hydrated, drink plenty of fluids daily. Continue treating your symptoms with over-the-counter medications such as tylenol for headache, body aches, fever. Please don't hesitate to return to the ER if you develop shortness of breath/difficulty breathing, or new or worsening symptoms.

## 2019-04-20 NOTE — ED Notes (Signed)
Patient verbalizes understanding of discharge instructions. Opportunity for questioning and answers were provided. Armband removed by staff, pt discharged from ED by [facility] to [facility].

## 2019-04-20 NOTE — ED Provider Notes (Signed)
[place] EMERGENCY DEPARTMENT Provider Note   CSN: [id number] Arrival date & time: 04/20/19  0912     History Chief Complaint  Patient presents with  . Chest Pain  . SVT  . covid symptoms    Bianca [name] is a 86 y.o. female [facility] with complaint of some chest pain this morning.  Patient was Covid +2 weeks ago and has been doing well treating her symptoms at home.  She states this morning she woke up with some mild chest pain that she is unable to describe, however it lasted in brief and is resolved.  EMS noted heart rate in the 150s, given 500 cc of normal saline with improvement to the 120s.  Patient states she probably has not been eating and drinking as much as she should because then she will have to get up to use the restroom and has been trying to ambulate as few times as possible.  She denies any shortness of breath or abdominal complaints.  Denies any cardiac history.  The history is provided by the patient and the EMS personnel.       Past Medical History:  Diagnosis Date  . COVID-19   . 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   History reviewed. No pertinent surgical history.   OB History   No obstetric history on file.     No family history on file.  Social History   Tobacco Use  . Smoking status: Never Smoker  . Smokeless tobacco: Never Used  Substance Use Topics  . Alcohol use: Never  . Drug use: Never    Home Medications Prior to Admission medications   Not on File    Allergies    Patient has no allergy information on record.  Review of Systems   Review of Systems  Constitutional: Negative for fever.  Respiratory: Negative for shortness of breath.   Cardiovascular: Positive for chest pain.  Gastrointestinal: Negative for  abdominal pain.  All other systems reviewed and are negative.   Physical Exam Updated Vital Signs BP (!) 139/51   Pulse (!) 56   Temp 98.1 F (36.7 C) (Oral)   Resp 17   Ht 5' 0.5" (1.537 m)   Wt 41.7 kg   SpO2 98%   BMI 17.67 kg/m   Physical Exam Vitals and nursing note reviewed.  Constitutional:      General: She is not in acute distress.    Appearance: She is well-developed.  HENT:     Head: Normocephalic and atraumatic.  Eyes:     Conjunctiva/sclera: Conjunctivae normal.  Cardiovascular:     Comments: Patient initially tachycardic in the 120s, however rate then suddenly slowed to high 40s - low 50s sinus rhythm Pulmonary:     Effort: Pulmonary effort is normal. No respiratory distress.     Breath sounds: Normal breath sounds.  Abdominal:     General: Bowel sounds are normal.     Palpations: Abdomen is soft.     Tenderness: There is no abdominal tenderness.  Musculoskeletal:     Right lower leg: No edema.     Left lower leg: No edema.  Skin:    General: Skin is warm.  Neurological:     Mental Status: She is alert.  Psychiatric:        Behavior: Behavior  normal.     ED Results / Procedures / Treatments   Labs (all labs ordered are listed, but only abnormal results are displayed) Labs Reviewed  BASIC METABOLIC PANEL - Abnormal; Notable for the following components:      Result Value   Calcium 8.0 (*)    GFR calc non Af Amer 51 (*)    GFR calc Af Amer 59 (*)    All other components within normal limits  CBC WITH DIFFERENTIAL/PLATELET    EKG EKG Interpretation  Date/Time:  Monday April 20 2019 09:19:33 EST Ventricular Rate:  121 PR Interval:    QRS Duration: 68 QT Interval:  267 QTC Calculation: 379 R Axis:   -31 Text Interpretation: Sinus or ectopic atrial tachycardia Prolonged PR interval Left axis deviation Low voltage, precordial leads Repolarization abnormality, prob rate related Confirmed by Nat Christen [phone]) on 04/20/2019 9:38:43 AM Also  confirmed by Nat Christen [phone])  on 04/20/2019 11:47:55 AM Also confirmed by Nat Christen [phone])  on 04/20/2019 12:40:08 PM   Radiology DG Chest Port 1 View  Result Date: 04/20/2019 CLINICAL DATA:  COVID-19 infection. Patient tested positive 2 weeks ago. Chest pain and tachycardia today. EXAM: PORTABLE CHEST 1 VIEW COMPARISON:  None. FINDINGS: The heart is mildly enlarged. There is no edema or effusion. Atherosclerotic changes are present at the aortic arch. Lung volumes scratched at the lungs are hyperexpanded. Ill-defined airspace opacities are present in the right upper lobe and right lower lobe. The left lung is clear. IMPRESSION: 1. Ill-defined airspace disease in the right upper lobe and right lower lobe compatible with pneumonia. 2. Emphysema. 3. Mild cardiomegaly without failure. 4. Aortic atherosclerosis. Electronically Signed   By: San Morelle M.D.   On: 04/20/2019 13:17    Procedures Procedures (including critical care time)  Medications Ordered in ED Medications  sodium chloride 0.9 % bolus 500 mL (0 mLs Intravenous Stopped 04/20/19 1533)    ED Course  I have reviewed the triage vital signs and the nursing notes.  Pertinent labs & imaging results that were available during my care of the patient were reviewed by me and considered in my medical decision making (see chart for details).  Clinical Course as of Apr 20 1611  Mon Apr 20, 2019  1203 Pt cardiac monitoring remains sinus rhythm with rate in mid 50s.    [JR]    Clinical Course User Index [JR] Zyaire Dumas, [place] N, PA-C   MDM Rules/Calculators/A&P                      Pt with known COVID-19 infection, [facility] with some mild brief chest discomfort and evaluation for her Covid symptoms.  She states she has been having some generalized body aches that worsened over the last 2 days.  Chest pain was brief this morning and resolved prior to evaluation.  No shortness of  breath or abdominal complaints.  She was noted to have sinus tachycardia on arrival with rates in the 120s though rate improved spontaneously on evaluation remained stable sinus rhythm throughout ED stay.  Labs are unremarkable.  Chest x-ray with ill-defined airspace disease in the right upper and lower lobes, likely secondary to COVID-19 virus.  Chest x-ray is negative.  She is overall well-appearing with stable vital signs, normal respiratory rate and effort and O2 saturation 98 to 100% on room air.  Patient discussed with and evaluated by Dr. Lacinda Axon.  Believe she is stable for discharge  at this time though instructed return to the ED should any symptoms worsen.  Discussed results, findings, treatment and follow up. Patient advised of return precautions. Patient verbalized understanding and agreed with plan.  Final Clinical Impression(s) / ED Diagnoses Final diagnoses:  COVID-19  Sinus tachycardia    Rx / DC Orders ED Discharge Orders    None       Keelon Zurn, [place] N, PA-C 04/20/19 1620    Nat Christen, MD 04/21/19 [phone]

## 2019-04-21 ENCOUNTER — Non-Acute Institutional Stay (SKILLED_NURSING_FACILITY): Payer: Medicare Other | Admitting: Internal Medicine

## 2019-04-21 ENCOUNTER — Encounter: Payer: Self-pay | Admitting: Internal Medicine

## 2019-04-21 DIAGNOSIS — J1282 Pneumonia due to coronavirus disease 2019: Secondary | ICD-10-CM

## 2019-04-21 DIAGNOSIS — U071 COVID-19: Secondary | ICD-10-CM | POA: Diagnosis not present

## 2019-04-21 DIAGNOSIS — K529 Noninfective gastroenteritis and colitis, unspecified: Secondary | ICD-10-CM | POA: Diagnosis not present

## 2019-04-21 NOTE — Progress Notes (Signed)
Provider:  Veleta Miners MD Location:   [place] Room Number: 681 Place of Service:  SNF (31)  PCP: Patient, No Pcp Per Patient Care Team: Patient, No Pcp Per as PCP - General (General Practice)  Extended Emergency Contact Information Primary Emergency Contact: Mincy,TROY Address: [address],  [place] Home Phone: [phone] Relation: None Secondary Emergency Contact: [name] Mobile Phone: [phone] Relation: Friend  Code Status: DNR Goals of Care: Advanced Directive information No flowsheet data found.    Chief Complaint  Patient presents with  . New Admit To SNF    Admission    HPI: Patient is a 86 y.o. female seen today for admission to SNF for Observation  Patient with no significant past medical history was diagnosed with Covid 2 weeks ago and was in quarantine in her apartment.   She was sent to the emergency room when she was evaluated by the nurse for tachycardia and hypoxia.  In the EMS she was noted to have a heart rate of 150 . Responded to normal saline with improvement 120 and eventually in the ED her heart rate came down to 80.  Her Chest x-ray did show right upper and lower lobe infiltrate was likely due to Covid.  She was discharged back to the facility without any further treatment as she had stabilized  3 p we will follow atient denies any shortness of breath coughing chest pain tachycardia.  Her only complaint is weakness she states she feels that she cannot get up without any help.Marland Kitchen  Her appetite is good she denies any dizziness. Patient lives by herself in her apartment does not have any children.  Has macular degeneration does not drive but is independent in her ADLs  Past Medical History:  Diagnosis Date  . COVID-19   . 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 No pertinent surgical history.  reports that  she has never smoked. She has never used smokeless tobacco. She reports that she does not drink alcohol or use drugs. Social History   Socioeconomic History  . Marital status: Married    Spouse name: Not on file  . Number of children: Not on file  . Years of education: Not on file  . Highest education level: Not on file  Occupational History  . Not on file  Tobacco Use  . Smoking status: Never Smoker  . Smokeless tobacco: Never Used  Substance and Sexual Activity  . Alcohol use: Never  . Drug use: Never  . Sexual activity: Not on file  Other Topics Concern  . Not on file  Social History Narrative  . Not on file   Social Determinants of Health   Financial Resource Strain:   . Difficulty of Paying Living Expenses: Not on file  Food Insecurity:   . Worried About Charity fundraiser in the Last Year: Not on file  . Ran Out of Food in the Last Year: Not on file  Transportation Needs:   . Lack of Transportation (Medical): Not on file  . Lack of Transportation (Non-Medical): Not on file  Physical Activity:   . Days of Exercise per Week: Not on file  . Minutes of Exercise per Session: Not on file  Stress:   . Feeling of Stress : Not on file  Social Connections:   . Frequency of Communication with Friends and Family:  Not on file  . Frequency of Social Gatherings with Friends and Family: Not on file  . Attends Religious Services: Not on file  . Active Member of Clubs or Organizations: Not on file  . Attends Archivist Meetings: Not on file  . Marital Status: Not on file  Intimate Partner Violence:   . Fear of Current or Ex-Partner: Not on file  . Emotionally Abused: Not on file  . Physically Abused: Not on file  . Sexually Abused: Not on file    Functional Status Survey:    History reviewed. No pertinent family history.  Health Maintenance  Topic Date Due  . TETANUS/TDAP  07/11/1941  . PNA vac Low Risk Adult (1 of 2 - PCV13) 07/10/1989  . INFLUENZA VACCINE   11/08/2018  . DEXA SCAN  Completed    Not on File  Allergies as of 04/21/2019   Not on File     Medication List       Accurate as of April 21, 2019 11:30 AM. If you have any questions, ask your nurse or doctor.        aspirin EC 81 MG tablet Take 81 mg by mouth daily.   b complex vitamins tablet Take 1 tablet by mouth daily.   Caltrate 600+D Plus Minerals 600-800 MG-UNIT Tabs Take by mouth daily. Take with food.   CENTRUM SILVER PO Take 1 tablet by mouth daily.   diphenoxylate-atropine 2.5-0.025 MG tablet Commonly known as: LOMOTIL Take by mouth 3 (three) times daily as needed for diarrhea or loose stools.   timolol 0.5 % ophthalmic solution Commonly known as: BETIMOL Place 1 drop into both eyes daily.   Tubersol 5 UNIT/0.1ML injection Generic drug: tuberculin Inject into the skin once. Start taking on: May 04, 2019       Review of Systems  Review of Systems  Constitutional: Negative for activity change, appetite change, chills, diaphoresis, fatigue and fever.  HENT: Negative for mouth sores, postnasal drip, rhinorrhea, sinus pain and sore throat.   Respiratory: Negative for apnea, cough, chest tightness, shortness of breath and wheezing.   Cardiovascular: Negative for chest pain, palpitations and leg swelling.  Gastrointestinal: Negative for abdominal distention, abdominal pain, constipation, diarrhea, nausea and vomiting.  Genitourinary: Negative for dysuria and frequency.  Musculoskeletal: Negative for arthralgias, joint swelling and myalgias.  Skin: Negative for rash.  Neurological: Positive for weakness and Fatigue.  Psychiatric/Behavioral: Negative for behavioral problems, confusion and sleep disturbance.     Vitals:   04/21/19 1125  BP: (!) 96/56  Pulse: 80  Resp: 18  Temp: (!) 97.4 F (36.3 C)  SpO2: 97%  Weight: 84 lb (38.1 kg)  Height: 5' (1.524 m)   Body mass index is 16.41 kg/m. Physical Exam  Constitutional: Oriented to  person, place, and time. Well-developed but Frail HENT:  Head: Normocephalic.  Mouth/Throat: Oropharynx is clear and moist.  Eyes: Pupils are equal, round, and reactive to light.  Neck: Neck supple.  Cardiovascular: Normal rate and normal heart sounds.  No murmur heard. Pulmonary/Chest: Effort normal and breath sounds normal. No respiratory distress. No wheezes. She had rales In Right Lower Lobe. Abdominal: Soft. Bowel sounds are normal. No distension. There is no tenderness. There is no rebound.  Musculoskeletal: No edema.  Lymphadenopathy: none Neurological: Alert and oriented to person, place, and time. Can walk with walker Skin: Skin is warm and dry.  Psychiatric: Normal mood and affect. Behavior is normal. Thought content normal.   Labs reviewed: Basic Metabolic  Panel: Recent Labs    04/20/19 1122  NA 143  K 4.0  CL 105  CO2 27  GLUCOSE 91  BUN 15  CREATININE 0.96  CALCIUM 8.0*   Liver Function Tests: No results for input(s): AST, ALT, ALKPHOS, BILITOT, PROT, ALBUMIN in the last 8760 hours. No results for input(s): LIPASE, AMYLASE in the last 8760 hours. No results for input(s): AMMONIA in the last 8760 hours. CBC: Recent Labs    04/20/19 1122  WBC 8.1  NEUTROABS 7.1  HGB 14.0  HCT 43.5  MCV 98.2  PLT 322   Cardiac Enzymes: No results for input(s): CKTOTAL, CKMB, CKMBINDEX, TROPONINI in the last 8760 hours. BNP: Invalid input(s): POCBNP No results found for: HGBA1C No results found for: TSH No results found for: VITAMINB12 No results found for: FOLATE No results found for: IRON, TIBC, FERRITIN  Imaging and Procedures obtained prior to SNF admission: DG Chest Port 1 View  Result Date: 04/20/2019 CLINICAL DATA:  COVID-19 infection. Patient tested positive 2 weeks ago. Chest pain and tachycardia today. EXAM: PORTABLE CHEST 1 VIEW COMPARISON:  None. FINDINGS: The heart is mildly enlarged. There is no edema or effusion. Atherosclerotic changes are present at  the aortic arch. Lung volumes scratched at the lungs are hyperexpanded. Ill-defined airspace opacities are present in the right upper lobe and right lower lobe. The left lung is clear. IMPRESSION: 1. Ill-defined airspace disease in the right upper lobe and right lower lobe compatible with pneumonia. 2. Emphysema. 3. Mild cardiomegaly without failure. 4. Aortic atherosclerosis. Electronically Signed   By: San Morelle M.D.   On: 04/20/2019 13:17    Assessment/Plan Covid Pneumonia Patient completely Asymptomatic. Would not start her on Antibiotics She is at end of her 2 weeks Continue Supportive care Plan is to discharge her to [facility] as it is hard to be by herself right now. Repeat BMP and CBC in few days  H/o Chronic Diarrhea Continue Lomotil PRn   Family/ staff Communication:   Labs/tests ordered:

## 2019-04-23 LAB — CBC AND DIFFERENTIAL
HCT: 39 (ref 36–46)
Hemoglobin: 13.4 (ref 12.0–16.0)
Neutrophils Absolute: 2569
Platelets: 281 (ref 150–399)
WBC: 3.8

## 2019-04-23 LAB — CBC: RBC: 4.17 (ref 3.87–5.11)

## 2019-04-23 LAB — COMPREHENSIVE METABOLIC PANEL
Albumin: 3.1 — AB (ref 3.5–5.0)
Calcium: 8.3 — AB (ref 8.7–10.7)
Globulin: 1.7

## 2019-04-23 LAB — BASIC METABOLIC PANEL
BUN: 19 (ref 4–21)
CO2: 27 — AB (ref 13–22)
Chloride: 108 (ref 99–108)
Creatinine: 0.7 (ref 0.5–1.1)
Glucose: 98
Potassium: 4.1 (ref 3.4–5.3)
Sodium: 142 (ref 137–147)

## 2019-04-23 LAB — HEPATIC FUNCTION PANEL
ALT: 15 (ref 7–35)
AST: 21 (ref 13–35)
Alkaline Phosphatase: 67 (ref 25–125)
Bilirubin, Total: 0.5

## 2019-04-26 ENCOUNTER — Other Ambulatory Visit: Payer: Self-pay

## 2019-04-26 ENCOUNTER — Encounter (HOSPITAL_COMMUNITY): Payer: Self-pay | Admitting: Emergency Medicine

## 2019-04-26 ENCOUNTER — Emergency Department (HOSPITAL_COMMUNITY)
Admission: EM | Admit: 2019-04-26 | Discharge: 2019-04-26 | Disposition: A | Discharge status: Home / Self Care | Payer: Medicare Other | Attending: Emergency Medicine | Admitting: Emergency Medicine

## 2019-04-26 DIAGNOSIS — I471 Supraventricular tachycardia, unspecified: Secondary | ICD-10-CM

## 2019-04-26 DIAGNOSIS — U071 COVID-19: Secondary | ICD-10-CM | POA: Diagnosis not present

## 2019-04-26 DIAGNOSIS — R079 Chest pain, unspecified: Secondary | ICD-10-CM | POA: Diagnosis not present

## 2019-04-26 DIAGNOSIS — Z7982 Long term (current) use of aspirin: Secondary | ICD-10-CM | POA: Diagnosis not present

## 2019-04-26 DIAGNOSIS — R0789 Other chest pain: Secondary | ICD-10-CM | POA: Diagnosis not present

## 2019-04-26 DIAGNOSIS — R000 Tachycardia, unspecified: Secondary | ICD-10-CM | POA: Diagnosis not present

## 2019-04-26 LAB — CBC WITH DIFFERENTIAL/PLATELET
Abs Immature Granulocytes: 0.03 10*3/uL (ref 0.00–0.07)
Basophils Absolute: 0 10*3/uL (ref 0.0–0.1)
Basophils Relative: 1 %
Eosinophils Absolute: 0.1 10*3/uL (ref 0.0–0.5)
Eosinophils Relative: 1 %
HCT: 47.5 % — ABNORMAL HIGH (ref 36.0–46.0)
Hemoglobin: 15.3 g/dL — ABNORMAL HIGH (ref 12.0–15.0)
Immature Granulocytes: 1 %
Lymphocytes Relative: 15 %
Lymphs Abs: 0.8 10*3/uL (ref 0.7–4.0)
MCH: 32.1 pg (ref 26.0–34.0)
MCHC: 32.2 g/dL (ref 30.0–36.0)
MCV: 99.8 fL (ref 80.0–100.0)
Monocytes Absolute: 0.5 10*3/uL (ref 0.1–1.0)
Monocytes Relative: 9 %
Neutro Abs: 4.1 10*3/uL (ref 1.7–7.7)
Neutrophils Relative %: 73 %
Platelets: 226 10*3/uL (ref 150–400)
RBC: 4.76 MIL/uL (ref 3.87–5.11)
RDW: 13.9 % (ref 11.5–15.5)
WBC: 5.5 10*3/uL (ref 4.0–10.5)
nRBC: 0 % (ref 0.0–0.2)

## 2019-04-26 LAB — COMPREHENSIVE METABOLIC PANEL
ALT: 23 U/L (ref 0–44)
AST: 33 U/L (ref 15–41)
Albumin: 2.6 g/dL — ABNORMAL LOW (ref 3.5–5.0)
Alkaline Phosphatase: 59 U/L (ref 38–126)
Anion gap: 9 (ref 5–15)
BUN: 12 mg/dL (ref 8–23)
CO2: 26 mmol/L (ref 22–32)
Calcium: 7.8 mg/dL — ABNORMAL LOW (ref 8.9–10.3)
Chloride: 106 mmol/L (ref 98–111)
Creatinine, Ser: 0.8 mg/dL (ref 0.44–1.00)
GFR calc Af Amer: >60 mL/min (ref >60)
GFR calc non Af Amer: >60 mL/min (ref >60)
Glucose, Bld: 109 mg/dL — ABNORMAL HIGH (ref 70–99)
Potassium: 4.1 mmol/L (ref 3.5–5.1)
Sodium: 141 mmol/L (ref 135–145)
Total Bilirubin: 0.6 mg/dL (ref 0.3–1.2)
Total Protein: 4.5 g/dL — ABNORMAL LOW (ref 6.5–8.1)

## 2019-04-26 MED ORDER — SODIUM CHLORIDE 0.9 % IV BOLUS (SEPSIS)
1000.0000 mL | Freq: Once | INTRAVENOUS | Status: AC
  Administered 2019-04-26: 04:00:00 1000 mL via INTRAVENOUS

## 2019-04-26 MED ORDER — SODIUM CHLORIDE 0.9 % IV SOLN
1000.0000 mL | INTRAVENOUS | Status: DC
  Administered 2019-04-26: 05:00:00 1000 mL via INTRAVENOUS

## 2019-04-26 NOTE — ED Provider Notes (Signed)
[facility] EMERGENCY DEPARTMENT Provider Note  CSN: [id number] Arrival date & time: 04/26/19 0881  Chief Complaint(s) Chest Pain To ED via [facility] from [facility] @ [place]. Covid + 2 weeks prior per report. C/o abnormal chest sensation. tackycardia begain approx 6 hours ago with rate in 150s-160s. Alert and oriented x 4.  HPI Bianca [name] is a 86 y.o. female known Covid positive patient recently seen on January 11 for SVT presents with tachycardia and chest discomfort and sensation of "not feeling right."  Apparently patient has had heart rate in the 150s for about 6 hours.  She began complaining of chest discomfort and facility called EMS.  Patient was provided with a small IV fluid bolus by EMS and heart rate improved to the 120s.  She reports that the chest discomfort has now resolved.  She denies any recent nausea or vomiting.  No diarrhea.  She does report decreased oral intake and appetite.  No abdominal pain.  Reports feeling current palpitations and rapid heart rate but denies any other physical complaints at this time.  HPI  Past Medical History Past Medical History:  Diagnosis Date  . COVID-19   . 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  Home Medication(s) Prior to Admission medications   Medication Sig Start Date End Date Taking? Authorizing Provider  aspirin EC 81 MG tablet Take 81 mg by mouth daily.    [provider]  b complex vitamins tablet Take 1 tablet by mouth daily.    [provider]  Calcium Carbonate-Vit D-Min (CALTRATE 600+D PLUS MINERALS) 600-800 MG-UNIT TABS Take by mouth daily. Take with food.    [provider]  diphenoxylate-atropine (LOMOTIL) 2.5-0.025 MG tablet Take by mouth 3 (three) times daily as needed for diarrhea or loose stools.    [provider]  Multiple Vitamins-Minerals  (CENTRUM SILVER PO) Take 1 tablet by mouth daily.    [provider]  timolol (BETIMOL) 0.5 % ophthalmic solution Place 1 drop into both eyes daily.    [provider]  tuberculin (TUBERSOL) 5 UNIT/0.1ML injection Inject into the skin once. 05/04/19 05/04/19  [provider]                                                                                                                                    Past Surgical History History reviewed. No pertinent surgical history. Family History No family history on file.  Social History Social History   Tobacco Use  . Smoking status: Never Smoker  . Smokeless tobacco: Never Used  Substance Use Topics  . Alcohol use: Never  . Drug use: Never   Allergies Patient has no known allergies.  Review of Systems Review of Systems All other systems are reviewed and are negative for acute change except as noted in the  HPI  Physical Exam Vital Signs   Vitals:   04/26/19 0445 04/26/19 0500 04/26/19 0600 04/26/19 0700  BP: (!) 118/57 (!) 114/59 120/62 139/65  Pulse: 64 60 (!) 58 (!) 53  Temp:      Resp: 14 14 17 15   SpO2: 98% 97% 98% 100%  TempSrc:        I have reviewed the triage vital signs BP (!) 118/57   Pulse 64   Temp 99.3 F (37.4 C) (Oral)   Resp 14   SpO2 98%   Physical Exam Vitals reviewed.  Constitutional:      General: She is not in acute distress.    Appearance: She is well-developed. She is not diaphoretic.     Comments: Frail and thin  HENT:     Head: Normocephalic and atraumatic.     Nose: Nose normal.  Eyes:     General: No scleral icterus.       Right eye: No discharge.        Left eye: No discharge.     Conjunctiva/sclera: Conjunctivae normal.     Pupils: Pupils are equal, round, and reactive to light.  Cardiovascular:     Rate and Rhythm: Regular rhythm. Tachycardia present.     Heart sounds: No murmur. No friction rub. No gallop.   Pulmonary:     Effort: Pulmonary effort is  normal. No respiratory distress.     Breath sounds: Normal breath sounds. No stridor. No rales.  Abdominal:     General: There is no distension.     Palpations: Abdomen is soft.     Tenderness: There is no abdominal tenderness.  Musculoskeletal:        General: No tenderness.     Cervical back: Normal range of motion and neck supple.  Skin:    General: Skin is warm and dry.     Findings: No erythema or rash.  Neurological:     Mental Status: She is alert and oriented to person, place, and time.     ED Results and Treatments Labs (all labs ordered are listed, but only abnormal results are displayed) Labs Reviewed  CBC WITH DIFFERENTIAL/PLATELET - Abnormal; Notable for the following components:      Result Value   Hemoglobin 15.3 (*)    HCT 47.5 (*)    All other components within normal limits  COMPREHENSIVE METABOLIC PANEL - Abnormal; Notable for the following components:   Glucose, Bld 109 (*)    Calcium 7.8 (*)    Total Protein 4.5 (*)    Albumin 2.6 (*)    All other components within normal limits                                                                                                                         EKG  EKG Interpretation  Date/Time:  Sunday April 26 2019 03:29:28 EST Ventricular Rate:  148 PR Interval:    QRS Duration: 63 QT Interval:  278  QTC Calculation: 437 R Axis:   -20 Text Interpretation: Supraventricular tachycardia Borderline left axis deviation Anteroseptal infarct, age indeterminate Confirmed by Addison Lank [phone]) on 04/26/2019 3:51:44 AM       EKG Interpretation  Date/Time:  Sunday April 26 2019 05:59:50 EST Ventricular Rate:  69 PR Interval:    QRS Duration: 119 QT Interval:  423 QTC Calculation: 409 R Axis:   5 Text Interpretation: Sinus rhythm Ventricular premature complex Prolonged PR interval Nonspecific intraventricular conduction delay Anteroseptal infarct, age indeterminate resolved SVT Confirmed by Addison Lank  [phone]) on 04/26/2019 6:14:37 AM       Radiology No results found.  Pertinent labs & imaging results that were available during my care of the patient were reviewed by me and considered in my medical decision making (see chart for details).  Medications Ordered in ED Medications  sodium chloride 0.9 % bolus 1,000 mL (0 mLs Intravenous Stopped 04/26/19 0450)    Followed by  0.9 %  sodium chloride infusion (1,000 mLs Intravenous New Bag/Given 04/26/19 0515)                                                                                                                                    Procedures .Critical Care Performed by: Fatima Blank, MD Authorized by: Fatima Blank, MD    CRITICAL CARE Performed by: Grayce Sessions Maurizio Geno Total critical care time: 30 minutes Critical care time was exclusive of separately billable procedures and treating other patients. Critical care was necessary to treat or prevent imminent or life-threatening deterioration. Critical care was time spent personally by me on the following activities: development of treatment plan with patient and/or surrogate as well as nursing, discussions with consultants, evaluation of patient's response to treatment, examination of patient, obtaining history from patient or surrogate, ordering and performing treatments and interventions, ordering and review of laboratory studies, ordering and review of radiographic studies, pulse oximetry and re-evaluation of patient's condition.   (including critical care time)  Medical Decision Making / ED Course I have reviewed the nursing notes for this encounter and the patient's prior records (if available in EHR or on provided paperwork).   SYA NESTLER was evaluated in Emergency Department on 04/26/2019 for the symptoms described in the history of present illness. She was evaluated in the context of the global COVID-19 pandemic, which necessitated consideration that  the patient might be at risk for infection with the SARS-CoV-2 virus that causes COVID-19. Institutional protocols and algorithms that pertain to the evaluation of patients at risk for COVID-19 are in a state of rapid change based on information released by regulatory bodies including the CDC and federal and state organizations. These policies and algorithms were followed during the patient's care in the ED.    Clinical Course as of Apr 25 738  Nancy Fetter Apr 26, 2019  0330 Known Covid patient presents in SVT.  Seen here for the same  last week.  Initially heart rates were in the 140s by EMS.  Improved with a small IV fluid bolus.  Her chest discomfort has resolved.  EKG confirms SVT with rate of 140s. Soft BP. SBP 90.  We will obtain screening labs to assess for any electrolyte derangements.  Provide the patient with IV fluids and reassess.   [PC]  0430 Patient's heart rate now in the 60s.  She is in normal sinus rhythm.  Labs without significant electrolyte derangements.  CBC hemoconcentrated likely from dehydration.  We will continue to monitor.   [PC]  [phone] Patient remained stable without recurrence of SVT.   [PC]    Clinical Course User Index [PC] Ioan Landini, Grayce Sessions, MD   The patient is safe for discharge with strict return precautions.   Final Clinical Impression(s) / ED Diagnoses Final diagnoses:  SVT (supraventricular tachycardia) ([place])     The patient appears reasonably screened and/or stabilized for discharge and I doubt any other medical condition or other [facility] requiring further screening, evaluation, or treatment in the ED at this time prior to discharge.  Disposition: Discharge  Condition: Good  I have discussed the results, Dx and Tx plan with the patient who expressed understanding and agree(s) with the plan. Discharge instructions discussed at great length. The patient was given strict return precautions who verbalized understanding of the instructions. No  further questions at time of discharge.    ED Discharge Orders    None        Follow Up: Primary care provider  Schedule an appointment as soon as possible for a visit  in 3-5 days    This chart was dictated using voice recognition software.  Despite best efforts to proofread,  errors can occur which can change the documentation meaning.   Fatima Blank, MD 04/26/19 [phone]

## 2019-04-26 NOTE — ED Triage Notes (Signed)
To ED via [facility] from [facility] home @ [place]. Covid + 2 weeks prior per report. C/o abnormal chest sensation. tackycardia begain approx 6 hours ago with rate in 150s-160s. Alert and oriented x 4.

## 2019-04-27 ENCOUNTER — Encounter: Payer: Self-pay | Admitting: Nurse Practitioner

## 2019-04-27 ENCOUNTER — Non-Acute Institutional Stay (SKILLED_NURSING_FACILITY): Payer: Medicare Other | Admitting: Nurse Practitioner

## 2019-04-27 DIAGNOSIS — E441 Mild protein-calorie malnutrition: Secondary | ICD-10-CM | POA: Diagnosis not present

## 2019-04-27 DIAGNOSIS — K529 Noninfective gastroenteritis and colitis, unspecified: Secondary | ICD-10-CM

## 2019-04-27 DIAGNOSIS — E46 Unspecified protein-calorie malnutrition: Secondary | ICD-10-CM | POA: Insufficient documentation

## 2019-04-27 DIAGNOSIS — J439 Emphysema, unspecified: Secondary | ICD-10-CM | POA: Insufficient documentation

## 2019-04-27 DIAGNOSIS — R350 Frequency of micturition: Secondary | ICD-10-CM | POA: Diagnosis not present

## 2019-04-27 DIAGNOSIS — U071 COVID-19: Secondary | ICD-10-CM | POA: Diagnosis not present

## 2019-04-27 DIAGNOSIS — I471 Supraventricular tachycardia, unspecified: Secondary | ICD-10-CM | POA: Insufficient documentation

## 2019-04-27 DIAGNOSIS — J1282 Pneumonia due to coronavirus disease 2019: Secondary | ICD-10-CM

## 2019-04-27 NOTE — Assessment & Plan Note (Signed)
Albumin 2.6 04/26/19, supportive care.

## 2019-04-27 NOTE — Assessment & Plan Note (Signed)
Per CXR 04/20/19, asymptomatic clinically.

## 2019-04-27 NOTE — Assessment & Plan Note (Signed)
Supportive care, the patient is asymptomatic except fatigue.

## 2019-04-27 NOTE — Progress Notes (Signed)
Location:   [facility] Room Number: 253 Place of Service:  SNF (31) Provider:  Ric Rosenberg NP  Patient, No Pcp Per  Patient Care Team: Patient, No Pcp Per as PCP - General (General Practice)  Extended Emergency Contact Information Primary Emergency Contact: Parfait,TROY Address: [place],  [place] Home Phone: [phone] Relation: None Secondary Emergency Contact: [name] Mobile Phone: [phone] Relation: Friend  Code Status:  DNR Goals of care: Advanced Directive information Advanced Directives 04/26/2019  Does Patient Have a Medical Advance Directive? Yes  Type of Advance Directive Out of facility DNR (pink MOST or yellow form)  Does patient want to make changes to medical advance directive? No - Patient declined     Chief Complaint  Patient presents with  . Acute Visit    Rapid heart beats    HPI:  [name] seen today for an acute visit  for ED eval 04/26/19 for tachycardia, hear rate is normalized today. ED EKG resolved SVT, SR, HR 69bpm. CBC/CMP unremarkable. 04/20/19 ED eval for chest discomfort, sinus tachycardia. The patient has baseline urinary frequency, loose stools, taking prn Lomotil. COVID PNA, supportive care, she is asymptomatic except fatigue.    Past Medical History:  Diagnosis Date  . COVID-19   . 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 No pertinent surgical history.  No Known Allergies  Allergies as of 04/27/2019   No Known Allergies     Medication List       Accurate as of April 27, 2019  2:55 PM. If you have any questions, ask your nurse or doctor.        aspirin EC 81 MG tablet Take 81 mg by mouth daily.   b complex vitamins tablet Take 1 tablet by mouth daily.   Caltrate 600+D Plus Minerals 600-800 MG-UNIT Tabs Take by mouth daily. Take with food.   CENTRUM SILVER PO Take  1 tablet by mouth daily.   diphenoxylate-atropine 2.5-0.025 MG tablet Commonly known as: LOMOTIL Take by mouth 3 (three) times daily as needed for diarrhea or loose stools.   timolol 0.5 % ophthalmic solution Commonly known as: BETIMOL Place 1 drop into both eyes daily.   Tubersol 5 UNIT/0.1ML injection Generic drug: tuberculin Inject into the skin once. Start taking on: May 04, 2019       Review of Systems  Constitutional: Positive for fatigue. Negative for activity change, appetite change, chills, diaphoresis and fever.  HENT: Positive for hearing loss. Negative for congestion and voice change.   Respiratory: Negative for cough, shortness of breath and wheezing.   Cardiovascular: Negative for chest pain, palpitations and leg swelling.  Gastrointestinal: Positive for diarrhea. Negative for abdominal distention, abdominal pain, constipation, nausea and vomiting.  Genitourinary: Positive for frequency. Negative for difficulty urinating, dysuria and urgency.  Musculoskeletal: Positive for gait problem.  Skin: Positive for color change.       Brownish skin discoloration BLE  Neurological: Negative for dizziness, speech difficulty, weakness and headaches.  Hematological:       Usually sleep from 6pm to 3-4am, interrupted by bathroom trips, not new.   Psychiatric/Behavioral: Positive for sleep disturbance. Negative for agitation, behavioral problems and hallucinations. The patient is not nervous/anxious.     Immunization History  Administered Date(s) Administered  . Influenza, High Dose Seasonal PF 11/08/2018  . Influenza-Unspecified 11/07/2013  Pertinent  Health Maintenance Due  Topic Date Due  . PNA vac Low Risk Adult (1 of 2 - PCV13) 07/10/1989  . INFLUENZA VACCINE  Completed  . DEXA SCAN  Completed   No flowsheet data found. Functional Status Survey:    Vitals:   04/27/19 1124  BP: 120/64  Pulse: 60  Resp: 18  Temp: (!) 97.5 F (36.4 C)  SpO2: 95%  Weight:  84 lb (38.1 kg)  Height: 5' (1.524 m)   Body mass index is 16.41 kg/m. Physical Exam Vitals and nursing note reviewed.  Constitutional:      General: She is not in acute distress.    Appearance: Normal appearance. She is not ill-appearing, toxic-appearing or diaphoretic.     Comments: Under weight.   HENT:     Head: Normocephalic and atraumatic.     Nose: Nose normal.     Mouth/Throat:     Mouth: Mucous membranes are moist.  Eyes:     Extraocular Movements: Extraocular movements intact.     Conjunctiva/sclera: Conjunctivae normal.     Pupils: Pupils are equal, round, and reactive to light.  Cardiovascular:     Rate and Rhythm: Normal rate and regular rhythm.     Heart sounds: No murmur.  Pulmonary:     Effort: Pulmonary effort is normal.     Breath sounds: No wheezing, rhonchi or rales.  Abdominal:     General: Bowel sounds are normal. There is no distension.     Palpations: Abdomen is soft.     Tenderness: There is no abdominal tenderness. There is no right CVA tenderness, left CVA tenderness, guarding or rebound.  Musculoskeletal:     Cervical back: Normal range of motion and neck supple.     Right lower leg: No edema.     Left lower leg: No edema.  Skin:    General: Skin is warm and dry.     Comments: Brownish skin discoloration R+L shins.   Neurological:     General: No focal deficit present.     Mental Status: She is alert and oriented to person, place, and time. Mental status is at baseline.     Cranial Nerves: No cranial nerve deficit.     Motor: No weakness.     Coordination: Coordination normal.     Gait: Gait abnormal.  Psychiatric:        Mood and Affect: Mood normal.        Behavior: Behavior normal.        Thought Content: Thought content normal.        Judgment: Judgment normal.     Labs reviewed: Recent Labs    04/20/19 1122 04/23/19 0000 04/26/19 0414  NA 143 142 141  K 4.0 4.1 4.1  CL 105 108 106  CO2 27 27* 26  GLUCOSE 91  --  109*  BUN  15 19 12   CREATININE 0.96 0.7 0.80  CALCIUM 8.0* 8.3* 7.8*   Recent Labs    04/23/19 0000 04/26/19 0414  AST 21 33  ALT 15 23  ALKPHOS 67 59  BILITOT  --  0.6  PROT  --  4.5*  ALBUMIN 3.1* 2.6*   Recent Labs    04/20/19 1122 04/23/19 0000 04/26/19 0414  WBC 8.1 3.8 5.5  NEUTROABS 7.1 2,569 4.1  HGB 14.0 13.4 15.3*  HCT 43.5 39 47.5*  MCV 98.2  --  99.8  PLT 322 281 226   No results found for: TSH No results  found for: HGBA1C No results found for: CHOL, HDL, LDLCALC, LDLDIRECT, TRIG, CHOLHDL  Significant Diagnostic Results in last 30 days:  DG Chest Port 1 View  Result Date: 04/20/2019 CLINICAL DATA:  COVID-19 infection. Patient tested positive 2 weeks ago. Chest pain and tachycardia today. EXAM: PORTABLE CHEST 1 VIEW COMPARISON:  None. FINDINGS: The heart is mildly enlarged. There is no edema or effusion. Atherosclerotic changes are present at the aortic arch. Lung volumes scratched at the lungs are hyperexpanded. Ill-defined airspace opacities are present in the right upper lobe and right lower lobe. The left lung is clear. IMPRESSION: 1. Ill-defined airspace disease in the right upper lobe and right lower lobe compatible with pneumonia. 2. Emphysema. 3. Mild cardiomegaly without failure. 4. Aortic atherosclerosis. Electronically Signed   By: San Morelle M.D.   On: 04/20/2019 13:17    Assessment/Plan Paroxysmal SVT (supraventricular tachycardia) ([facility]) Occurrence 2x in the past 10 days, 04/20/19, 04/26/19. Resolved with small amount IVF, CBC/CMP unremarkable, recommended Cardiology consultation as outpatient if it recurs. Observe.   Pneumonia due to COVID-19 virus Supportive care, the patient is asymptomatic except fatigue.   Chronic diarrhea Prn Lomotil.   Urinary frequency Not new, no dysuria or urinary urgency, continue supportive care.   Protein-calorie malnutrition (HCC) Albumin 2.6 04/26/19, supportive care.   Emphysema of lung ([facility]) Per CXR 04/20/19,  asymptomatic clinically.      Family/ staff Communication: plan of care reviewed with the patient and charge nurse.   Labs/tests ordered:  none  Time spend 25 minutes.

## 2019-04-27 NOTE — Assessment & Plan Note (Signed)
Not new, no dysuria or urinary urgency, continue supportive care.

## 2019-04-27 NOTE — Assessment & Plan Note (Signed)
Prn Lomotil.

## 2019-04-27 NOTE — Assessment & Plan Note (Signed)
Occurrence 2x in the past 10 days, 04/20/19, 04/26/19. Resolved with small amount IVF, CBC/CMP unremarkable, recommended Cardiology consultation as outpatient if it recurs. Observe.

## 2019-04-29 ENCOUNTER — Non-Acute Institutional Stay (SKILLED_NURSING_FACILITY): Payer: Medicare Other | Admitting: Nurse Practitioner

## 2019-04-29 ENCOUNTER — Encounter: Payer: Self-pay | Admitting: Nurse Practitioner

## 2019-04-29 DIAGNOSIS — U071 COVID-19: Secondary | ICD-10-CM

## 2019-04-29 DIAGNOSIS — R350 Frequency of micturition: Secondary | ICD-10-CM | POA: Diagnosis not present

## 2019-04-29 DIAGNOSIS — I471 Supraventricular tachycardia: Secondary | ICD-10-CM

## 2019-04-29 DIAGNOSIS — J1282 Pneumonia due to coronavirus disease 2019: Secondary | ICD-10-CM | POA: Diagnosis not present

## 2019-04-29 DIAGNOSIS — K529 Noninfective gastroenteritis and colitis, unspecified: Secondary | ICD-10-CM

## 2019-04-29 NOTE — Progress Notes (Signed)
Location:   [facility] Room Number: 270 Place of Service:  SNF (31) Provider:  Jashae Wiggs, NP  Patient, No Pcp Per  Patient Care Team: Patient, No Pcp Per as PCP - General (General Practice)  Extended Emergency Contact Information Primary Emergency Contact: Vi,TROY Address: [place],  [place] Home Phone: [phone] Relation: None Secondary Emergency Contact: [name] Mobile Phone: [phone] Relation: Friend  Code Status:   Goals of care: Advanced Directive information Advanced Directives 04/29/2019  Does Patient Have a Medical Advance Directive? Yes  Type of Paramedic of [place];Living will  Does patient want to make changes to medical advance directive? No - Patient declined  Copy of [place] in Chart? Yes - validated most recent copy scanned in chart (See row information)     Chief Complaint  Patient presents with  . Acute Visit    Discharge To [place]    HPI:  [name] seen today for an acute visit for discharge [facility] stay for observation following 04/20/19 ED visit for tachycardia/hypoxia, resolved after IVF NS, PNA on CXR with right upper/lower lobs infiltrates w/o treatment. The patient was diagnosed for COVID positive about 2 weeks ago and was quarantined in her apartment prior to the tachycardia event, her main complaint is generalized weakness, which has been improved with a set back when she underwent ED eval again 04/26/19 for tachycardia, responded to IVF NS. Hx of chronic loose stools, prn Imodium. She has regained physical strength, but able to return to [place], will discharge to [place] for transitional period.    Past Medical History:  Diagnosis Date  . COVID-19   . 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 No pertinent surgical history.  Allergies   Allergen Reactions  . Azopt [Brinzolamide]   . Brimonidine   . Lumigan [Bimatoprost]     Allergies as of 04/29/2019      Reactions   Azopt [brinzolamide]    Brimonidine    Lumigan [bimatoprost]       Medication List       Accurate as of April 29, 2019 11:59 PM. If you have any questions, ask your nurse or doctor.        aspirin EC 81 MG tablet Take 81 mg by mouth daily. 7am-10am.   b complex vitamins tablet Take 1 tablet by mouth daily. 7am-10am.   Caltrate 600+D Plus Minerals 600-800 MG-UNIT Tabs Take 1 tablet by mouth daily. (1,500mg ) Take with food at 7am-10am.   CENTRUM SILVER PO Take 1 tablet by mouth daily. 7am-10am.   diphenoxylate-atropine 2.5-0.025 MG tablet Commonly known as: LOMOTIL Take by mouth 3 (three) times daily as needed for diarrhea or loose stools.   timolol 0.5 % ophthalmic solution Commonly known as: BETIMOL Place 1 drop into both eyes daily. 7am-10am.   Tubersol 5 UNIT/0.1ML injection Generic drug: tuberculin Inject into the skin once. 3pm-11pm. Start taking on: May 04, 2019       Review of Systems  Constitutional: Positive for fatigue. Negative for activity change, appetite change, chills, diaphoresis and fever.  HENT: Positive for hearing loss. Negative for voice change.   Eyes: Negative for visual disturbance.  Respiratory: Negative for cough, shortness of breath and wheezing.   Cardiovascular: Negative for chest pain, palpitations and leg swelling.  Gastrointestinal: Positive for  diarrhea. Negative for abdominal distention, abdominal pain, constipation, nausea and vomiting.  Genitourinary: Positive for frequency. Negative for difficulty urinating, dysuria and urgency.  Musculoskeletal: Positive for gait problem.  Skin: Positive for color change.       Some brownish skin changes BLE  Neurological: Negative for dizziness, speech difficulty, weakness and headaches.  Psychiatric/Behavioral: Negative for agitation, behavioral  problems, confusion, hallucinations and sleep disturbance. The patient is not nervous/anxious.     Immunization History  Administered Date(s) Administered  . Influenza, High Dose Seasonal PF 11/08/2018  . Influenza-Unspecified 11/07/2013   Pertinent  Health Maintenance Due  Topic Date Due  . PNA vac Low Risk Adult (1 of 2 - PCV13) 07/10/1989  . INFLUENZA VACCINE  Completed  . DEXA SCAN  Completed   No flowsheet data found. Functional Status Survey:    Vitals:   04/29/19 1603  BP: (!) 98/52  Pulse: (!) 56  Resp: 18  Temp: (!) 97.2 F (36.2 C)  SpO2: 97%  Weight: 84 lb 9.6 oz (38.4 kg)  Height: 5' (1.524 m)   Body mass index is 16.52 kg/m. Physical Exam Vitals and nursing note reviewed.  Constitutional:      Appearance: Normal appearance.     Comments: Under weight.   HENT:     Head: Normocephalic and atraumatic.     Nose: Nose normal.     Mouth/Throat:     Mouth: Mucous membranes are moist.  Eyes:     Extraocular Movements: Extraocular movements intact.     Conjunctiva/sclera: Conjunctivae normal.     Pupils: Pupils are equal, round, and reactive to light.  Cardiovascular:     Rate and Rhythm: Normal rate and regular rhythm.     Heart sounds: No murmur.  Pulmonary:     Effort: Pulmonary effort is normal.     Breath sounds: No wheezing, rhonchi or rales.  Abdominal:     General: Bowel sounds are normal. There is no distension.     Palpations: Abdomen is soft.     Tenderness: There is no abdominal tenderness. There is no right CVA tenderness, left CVA tenderness, guarding or rebound.  Musculoskeletal:     Cervical back: Normal range of motion and neck supple.     Right lower leg: No edema.     Left lower leg: No edema.     Comments: Ambulates with walker.   Skin:    General: Skin is warm and dry.     Comments: Some brownish skin changes anterior lower leg R+L  Neurological:     General: No focal deficit present.     Mental Status: She is alert and  oriented to person, place, and time. Mental status is at baseline.     Cranial Nerves: No cranial nerve deficit.     Motor: No weakness.     Coordination: Coordination normal.     Gait: Gait abnormal.  Psychiatric:        Mood and Affect: Mood normal.        Behavior: Behavior normal.        Thought Content: Thought content normal.        Judgment: Judgment normal.     Labs reviewed: Recent Labs    04/20/19 1122 04/23/19 0000 04/26/19 0414  NA 143 142 141  K 4.0 4.1 4.1  CL 105 108 106  CO2 27 27* 26  GLUCOSE 91  --  109*  BUN 15 19 12   CREATININE 0.96 0.7 0.80  CALCIUM 8.0*  8.3* 7.8*   Recent Labs    04/23/19 0000 04/26/19 0414  AST 21 33  ALT 15 23  ALKPHOS 67 59  BILITOT  --  0.6  PROT  --  4.5*  ALBUMIN 3.1* 2.6*   Recent Labs    04/20/19 1122 04/23/19 0000 04/26/19 0414  WBC 8.1 3.8 5.5  NEUTROABS 7.1 2,569 4.1  HGB 14.0 13.4 15.3*  HCT 43.5 39 47.5*  MCV 98.2  --  99.8  PLT 322 281 226   No results found for: TSH No results found for: HGBA1C No results found for: CHOL, HDL, LDLCALC, LDLDIRECT, TRIG, CHOLHDL  Significant Diagnostic Results in last 30 days:  DG Chest Port 1 View  Result Date: 04/20/2019 CLINICAL DATA:  COVID-19 infection. Patient tested positive 2 weeks ago. Chest pain and tachycardia today. EXAM: PORTABLE CHEST 1 VIEW COMPARISON:  None. FINDINGS: The heart is mildly enlarged. There is no edema or effusion. Atherosclerotic changes are present at the aortic arch. Lung volumes scratched at the lungs are hyperexpanded. Ill-defined airspace opacities are present in the right upper lobe and right lower lobe. The left lung is clear. IMPRESSION: 1. Ill-defined airspace disease in the right upper lobe and right lower lobe compatible with pneumonia. 2. Emphysema. 3. Mild cardiomegaly without failure. 4. Aortic atherosclerosis. Electronically Signed   By: San Morelle M.D.   On: 04/20/2019 13:17    Assessment/Plan Paroxysmal SVT  (supraventricular tachycardia) (HCC) 2x in the past 10 days, likely related to COVID PNA, will continue to observe, may consider Cardiology eval as out patient if recurs.   Pneumonia due to COVID-19 virus Asymptomatic except fatigue, no wheezes, cough, chest pain, SOB, or O2 desaturation. Was not treated.   Chronic diarrhea Chronic, prn Imodium.   Urinary frequency Not new, observe.      Family/ staff Communication: plan of care reviewed with the patient and charge nurse.   Labs/tests ordered:  none  Time spend 25 minutes.

## 2019-04-30 ENCOUNTER — Encounter (INDEPENDENT_AMBULATORY_CARE_PROVIDER_SITE_OTHER): Payer: Medicare Other | Admitting: Ophthalmology

## 2019-04-30 ENCOUNTER — Encounter: Payer: Self-pay | Admitting: Nurse Practitioner

## 2019-04-30 DIAGNOSIS — N951 Menopausal and female climacteric states: Secondary | ICD-10-CM | POA: Diagnosis not present

## 2019-04-30 DIAGNOSIS — B029 Zoster without complications: Secondary | ICD-10-CM | POA: Diagnosis not present

## 2019-04-30 DIAGNOSIS — H40009 Preglaucoma, unspecified, unspecified eye: Secondary | ICD-10-CM | POA: Diagnosis not present

## 2019-04-30 DIAGNOSIS — H25013 Cortical age-related cataract, bilateral: Secondary | ICD-10-CM | POA: Diagnosis not present

## 2019-04-30 DIAGNOSIS — U071 COVID-19: Secondary | ICD-10-CM | POA: Diagnosis not present

## 2019-04-30 DIAGNOSIS — R41841 Cognitive communication deficit: Secondary | ICD-10-CM | POA: Diagnosis not present

## 2019-04-30 DIAGNOSIS — R55 Syncope and collapse: Secondary | ICD-10-CM | POA: Diagnosis not present

## 2019-04-30 DIAGNOSIS — R2681 Unsteadiness on feet: Secondary | ICD-10-CM | POA: Diagnosis not present

## 2019-04-30 DIAGNOSIS — M6281 Muscle weakness (generalized): Secondary | ICD-10-CM | POA: Diagnosis not present

## 2019-04-30 DIAGNOSIS — W010XXD Fall on same level from slipping, tripping and stumbling without subsequent striking against object, subsequent encounter: Secondary | ICD-10-CM | POA: Diagnosis not present

## 2019-04-30 DIAGNOSIS — R29818 Other symptoms and signs involving the nervous system: Secondary | ICD-10-CM | POA: Diagnosis not present

## 2019-04-30 DIAGNOSIS — R32 Unspecified urinary incontinence: Secondary | ICD-10-CM | POA: Diagnosis not present

## 2019-04-30 DIAGNOSIS — Z7389 Other problems related to life management difficulty: Secondary | ICD-10-CM | POA: Diagnosis not present

## 2019-04-30 NOTE — Assessment & Plan Note (Signed)
Chronic, prn Imodium.

## 2019-04-30 NOTE — Assessment & Plan Note (Signed)
2x in the past 10 days, likely related to COVID PNA, will continue to observe, may consider Cardiology eval as out patient if recurs.

## 2019-04-30 NOTE — Assessment & Plan Note (Signed)
Not new, observe.

## 2019-04-30 NOTE — Assessment & Plan Note (Signed)
Asymptomatic except fatigue, no wheezes, cough, chest pain, SOB, or O2 desaturation. Was not treated.

## 2019-05-01 DIAGNOSIS — Z7389 Other problems related to life management difficulty: Secondary | ICD-10-CM | POA: Diagnosis not present

## 2019-05-01 DIAGNOSIS — M6281 Muscle weakness (generalized): Secondary | ICD-10-CM | POA: Diagnosis not present

## 2019-05-01 DIAGNOSIS — R41841 Cognitive communication deficit: Secondary | ICD-10-CM | POA: Diagnosis not present

## 2019-05-01 DIAGNOSIS — U071 COVID-19: Secondary | ICD-10-CM | POA: Diagnosis not present

## 2019-05-01 DIAGNOSIS — R2681 Unsteadiness on feet: Secondary | ICD-10-CM | POA: Diagnosis not present

## 2019-05-01 DIAGNOSIS — R55 Syncope and collapse: Secondary | ICD-10-CM | POA: Diagnosis not present

## 2019-05-04 DIAGNOSIS — R2681 Unsteadiness on feet: Secondary | ICD-10-CM | POA: Diagnosis not present

## 2019-05-04 DIAGNOSIS — M6281 Muscle weakness (generalized): Secondary | ICD-10-CM | POA: Diagnosis not present

## 2019-05-04 DIAGNOSIS — R55 Syncope and collapse: Secondary | ICD-10-CM | POA: Diagnosis not present

## 2019-05-04 DIAGNOSIS — U071 COVID-19: Secondary | ICD-10-CM | POA: Diagnosis not present

## 2019-05-04 DIAGNOSIS — R41841 Cognitive communication deficit: Secondary | ICD-10-CM | POA: Diagnosis not present

## 2019-05-04 DIAGNOSIS — Z7389 Other problems related to life management difficulty: Secondary | ICD-10-CM | POA: Diagnosis not present

## 2019-05-05 DIAGNOSIS — U071 COVID-19: Secondary | ICD-10-CM | POA: Diagnosis not present

## 2019-05-05 DIAGNOSIS — R55 Syncope and collapse: Secondary | ICD-10-CM | POA: Diagnosis not present

## 2019-05-05 DIAGNOSIS — R2681 Unsteadiness on feet: Secondary | ICD-10-CM | POA: Diagnosis not present

## 2019-05-05 DIAGNOSIS — R41841 Cognitive communication deficit: Secondary | ICD-10-CM | POA: Diagnosis not present

## 2019-05-05 DIAGNOSIS — M6281 Muscle weakness (generalized): Secondary | ICD-10-CM | POA: Diagnosis not present

## 2019-05-05 DIAGNOSIS — Z7389 Other problems related to life management difficulty: Secondary | ICD-10-CM | POA: Diagnosis not present

## 2019-05-06 DIAGNOSIS — R2681 Unsteadiness on feet: Secondary | ICD-10-CM | POA: Diagnosis not present

## 2019-05-06 DIAGNOSIS — R55 Syncope and collapse: Secondary | ICD-10-CM | POA: Diagnosis not present

## 2019-05-06 DIAGNOSIS — R41841 Cognitive communication deficit: Secondary | ICD-10-CM | POA: Diagnosis not present

## 2019-05-06 DIAGNOSIS — Z7389 Other problems related to life management difficulty: Secondary | ICD-10-CM | POA: Diagnosis not present

## 2019-05-06 DIAGNOSIS — M6281 Muscle weakness (generalized): Secondary | ICD-10-CM | POA: Diagnosis not present

## 2019-05-06 DIAGNOSIS — U071 COVID-19: Secondary | ICD-10-CM | POA: Diagnosis not present

## 2019-05-07 DIAGNOSIS — M6281 Muscle weakness (generalized): Secondary | ICD-10-CM | POA: Diagnosis not present

## 2019-05-07 DIAGNOSIS — R55 Syncope and collapse: Secondary | ICD-10-CM | POA: Diagnosis not present

## 2019-05-07 DIAGNOSIS — R2681 Unsteadiness on feet: Secondary | ICD-10-CM | POA: Diagnosis not present

## 2019-05-07 DIAGNOSIS — U071 COVID-19: Secondary | ICD-10-CM | POA: Diagnosis not present

## 2019-05-07 DIAGNOSIS — R41841 Cognitive communication deficit: Secondary | ICD-10-CM | POA: Diagnosis not present

## 2019-05-07 DIAGNOSIS — Z7389 Other problems related to life management difficulty: Secondary | ICD-10-CM | POA: Diagnosis not present

## 2019-05-08 DIAGNOSIS — R2681 Unsteadiness on feet: Secondary | ICD-10-CM | POA: Diagnosis not present

## 2019-05-08 DIAGNOSIS — U071 COVID-19: Secondary | ICD-10-CM | POA: Diagnosis not present

## 2019-05-08 DIAGNOSIS — M6281 Muscle weakness (generalized): Secondary | ICD-10-CM | POA: Diagnosis not present

## 2019-05-08 DIAGNOSIS — Z7389 Other problems related to life management difficulty: Secondary | ICD-10-CM | POA: Diagnosis not present

## 2019-05-08 DIAGNOSIS — R55 Syncope and collapse: Secondary | ICD-10-CM | POA: Diagnosis not present

## 2019-05-08 DIAGNOSIS — R41841 Cognitive communication deficit: Secondary | ICD-10-CM | POA: Diagnosis not present

## 2019-05-11 DIAGNOSIS — R55 Syncope and collapse: Secondary | ICD-10-CM | POA: Diagnosis not present

## 2019-05-11 DIAGNOSIS — R2681 Unsteadiness on feet: Secondary | ICD-10-CM | POA: Diagnosis not present

## 2019-05-11 DIAGNOSIS — H40009 Preglaucoma, unspecified, unspecified eye: Secondary | ICD-10-CM | POA: Diagnosis not present

## 2019-05-11 DIAGNOSIS — N951 Menopausal and female climacteric states: Secondary | ICD-10-CM | POA: Diagnosis not present

## 2019-05-11 DIAGNOSIS — B029 Zoster without complications: Secondary | ICD-10-CM | POA: Diagnosis not present

## 2019-05-11 DIAGNOSIS — M6281 Muscle weakness (generalized): Secondary | ICD-10-CM | POA: Diagnosis not present

## 2019-05-11 DIAGNOSIS — R29818 Other symptoms and signs involving the nervous system: Secondary | ICD-10-CM | POA: Diagnosis not present

## 2019-05-11 DIAGNOSIS — H25013 Cortical age-related cataract, bilateral: Secondary | ICD-10-CM | POA: Diagnosis not present

## 2019-05-11 DIAGNOSIS — U071 COVID-19: Secondary | ICD-10-CM | POA: Diagnosis not present

## 2019-05-11 DIAGNOSIS — Z7389 Other problems related to life management difficulty: Secondary | ICD-10-CM | POA: Diagnosis not present

## 2019-05-11 DIAGNOSIS — R41841 Cognitive communication deficit: Secondary | ICD-10-CM | POA: Diagnosis not present

## 2019-05-11 DIAGNOSIS — W010XXD Fall on same level from slipping, tripping and stumbling without subsequent striking against object, subsequent encounter: Secondary | ICD-10-CM | POA: Diagnosis not present

## 2019-05-12 ENCOUNTER — Encounter (INDEPENDENT_AMBULATORY_CARE_PROVIDER_SITE_OTHER): Payer: Medicare Other | Admitting: Ophthalmology

## 2019-05-12 DIAGNOSIS — H43813 Vitreous degeneration, bilateral: Secondary | ICD-10-CM | POA: Diagnosis not present

## 2019-05-12 DIAGNOSIS — R29818 Other symptoms and signs involving the nervous system: Secondary | ICD-10-CM | POA: Diagnosis not present

## 2019-05-12 DIAGNOSIS — Z7389 Other problems related to life management difficulty: Secondary | ICD-10-CM | POA: Diagnosis not present

## 2019-05-12 DIAGNOSIS — R2681 Unsteadiness on feet: Secondary | ICD-10-CM | POA: Diagnosis not present

## 2019-05-12 DIAGNOSIS — H353231 Exudative age-related macular degeneration, bilateral, with active choroidal neovascularization: Secondary | ICD-10-CM

## 2019-05-12 DIAGNOSIS — M6281 Muscle weakness (generalized): Secondary | ICD-10-CM | POA: Diagnosis not present

## 2019-05-12 DIAGNOSIS — R55 Syncope and collapse: Secondary | ICD-10-CM | POA: Diagnosis not present

## 2019-05-12 DIAGNOSIS — U071 COVID-19: Secondary | ICD-10-CM | POA: Diagnosis not present

## 2019-05-13 ENCOUNTER — Encounter: Payer: Self-pay | Admitting: Nurse Practitioner

## 2019-05-13 ENCOUNTER — Non-Acute Institutional Stay: Payer: Medicare Other | Admitting: Nurse Practitioner

## 2019-05-13 DIAGNOSIS — K529 Noninfective gastroenteritis and colitis, unspecified: Secondary | ICD-10-CM

## 2019-05-13 DIAGNOSIS — I471 Supraventricular tachycardia: Secondary | ICD-10-CM | POA: Diagnosis not present

## 2019-05-13 DIAGNOSIS — R41841 Cognitive communication deficit: Secondary | ICD-10-CM | POA: Diagnosis not present

## 2019-05-13 DIAGNOSIS — S81811D Laceration without foreign body, right lower leg, subsequent encounter: Secondary | ICD-10-CM

## 2019-05-13 DIAGNOSIS — W010XXD Fall on same level from slipping, tripping and stumbling without subsequent striking against object, subsequent encounter: Secondary | ICD-10-CM | POA: Diagnosis not present

## 2019-05-13 DIAGNOSIS — R55 Syncope and collapse: Secondary | ICD-10-CM | POA: Diagnosis not present

## 2019-05-13 DIAGNOSIS — U071 COVID-19: Secondary | ICD-10-CM | POA: Diagnosis not present

## 2019-05-13 DIAGNOSIS — R2681 Unsteadiness on feet: Secondary | ICD-10-CM | POA: Diagnosis not present

## 2019-05-13 DIAGNOSIS — R29898 Other symptoms and signs involving the musculoskeletal system: Secondary | ICD-10-CM | POA: Diagnosis not present

## 2019-05-13 DIAGNOSIS — M6281 Muscle weakness (generalized): Secondary | ICD-10-CM | POA: Diagnosis not present

## 2019-05-13 DIAGNOSIS — N951 Menopausal and female climacteric states: Secondary | ICD-10-CM | POA: Diagnosis not present

## 2019-05-13 DIAGNOSIS — J1282 Pneumonia due to coronavirus disease 2019: Secondary | ICD-10-CM | POA: Diagnosis not present

## 2019-05-13 DIAGNOSIS — B029 Zoster without complications: Secondary | ICD-10-CM | POA: Diagnosis not present

## 2019-05-13 DIAGNOSIS — H25013 Cortical age-related cataract, bilateral: Secondary | ICD-10-CM | POA: Diagnosis not present

## 2019-05-13 DIAGNOSIS — N3946 Mixed incontinence: Secondary | ICD-10-CM | POA: Diagnosis not present

## 2019-05-13 DIAGNOSIS — R1313 Dysphagia, pharyngeal phase: Secondary | ICD-10-CM | POA: Diagnosis not present

## 2019-05-13 NOTE — Progress Notes (Signed)
This encounter was created in error - please disregard.

## 2019-05-13 NOTE — Progress Notes (Signed)
Location:   [place] Room Number: 174 YCXKG of Service:  ALF (13) Provider: Marlana Latus NP  Patient, No Pcp Per  Patient Care Team: Patient, No Pcp Per as PCP - General (General Practice)  Extended Emergency Contact Information Primary Emergency Contact: Egolf,TROY Address: [place],  [place] Home Phone: [phone] Relation: None Secondary Emergency Contact: [name] Mobile Phone: [phone] Relation: Friend  Code Status: DNR Goals of care: Advanced Directive information Advanced Directives 05/13/2019  Does Patient Have a Medical Advance Directive? Yes  Type of Paramedic of [place];Living will;Out of facility DNR (pink MOST or yellow form)  Does patient want to make changes to medical advance directive? No - Patient declined  Copy of [place] in Chart? Yes - validated most recent copy scanned in chart (See row information)  Pre-existing out of facility DNR order (yellow form or pink MOST form) Yellow form placed in chart (order not valid for inpatient use)     Chief Complaint  Patient presents with  . Acute Visit    right lower leg skin tear, return to [place] [facility]    HPI:  [name] seen today for an acute visit for evaluation of the right lower leg skin tear sustained from the transport bus to her eye appointment. The wound is approximated with steri strips, covered with non adhesive dressing, no active bleeding or s/s of infection.   The patient was admitted to SNF acute COVID unit [facility] following ED eval 04/20/19 for tachycardia, responded to IVF, heart rate down to 80s in ED from 150bpm, she was found to have COVI PNA R upper/lower lung infiltrates. Her fatigue and generalized weakness are the main complaints. The patient has regained her physical strength and ADL function gradually with a set back ED eval for tachycardia 04/26/19, responded to small amount of IVF. The  patient continues to regain her strength, transitioned to [place] since her goal is to return to her [place] at [facility]. She is independent of ADLs presently, safe to to discharged to her apartment at [facility]  Hx of chronic diarrhea, prn Imodium is efficient.    Past Medical History:  Diagnosis Date  . COVID-19   . 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 No pertinent surgical history.  Allergies  Allergen Reactions  . Azopt [Brinzolamide]   . Brimonidine   . Lumigan [Bimatoprost]     Allergies as of 05/13/2019      Reactions   Azopt [brinzolamide]    Brimonidine    Lumigan [bimatoprost]       Medication List       Accurate as of May 13, 2019 11:59 PM. If you have any questions, ask your nurse or doctor.        STOP taking these medications   Caltrate 600+D Plus Minerals 600-800 MG-UNIT Tabs Stopped by: Skylynne Schlechter X Cinch Ormond, NP     TAKE these medications   aspirin EC 81 MG tablet Take 81 mg by mouth daily. 7am-10am.   b complex vitamins tablet Take 1 tablet by mouth daily. 7am-10am.   Calcium Carbonate-Vitamin D 600-400 MG-UNIT tablet Take 1 tablet by mouth daily.   CENTRUM SILVER PO Take 1 tablet by mouth daily. 7am-10am.   diphenoxylate-atropine 2.5-0.025 MG tablet Commonly known as: LOMOTIL Take by mouth 3 (three) times daily as  needed for diarrhea or loose stools.   NON FORMULARY Moisturizing Cream to extremities with morning and evening care Every Shift   NON FORMULARY Regular Special Instructions: Regular Diet, Thin Liquids   timolol 0.5 % ophthalmic solution Commonly known as: BETIMOL Place 1 drop into both eyes daily. 7am-10am.   trimethoprim-polymyxin b ophthalmic solution Commonly known as: POLYTRIM Place 1 drop into both eyes in the morning, at noon, in the evening, and at bedtime.       Review of Systems  Constitutional: Negative for activity change, appetite change,  diaphoresis, fatigue and fever.  HENT: Positive for hearing loss. Negative for congestion and voice change.   Eyes: Negative for visual disturbance.  Respiratory: Negative for cough, shortness of breath and wheezing.   Cardiovascular: Positive for leg swelling. Negative for chest pain and palpitations.  Gastrointestinal: Positive for diarrhea. Negative for abdominal distention, anal bleeding, constipation, nausea and vomiting.       Occasional diarrhea-not new.   Genitourinary: Positive for frequency. Negative for difficulty urinating, dysuria and urgency.       Chronic urinary frequency.   Musculoskeletal: Positive for gait problem.  Skin: Positive for wound. Negative for color change and pallor.       Anterior right lower leg skin tear above the right ankle.   Neurological: Negative for dizziness, speech difficulty, weakness and headaches.  Psychiatric/Behavioral: Negative for agitation, behavioral problems, hallucinations and sleep disturbance. The patient is not nervous/anxious.     Immunization History  Administered Date(s) Administered  . Influenza, High Dose Seasonal PF 11/08/2018  . Influenza-Unspecified 11/07/2013   Pertinent  Health Maintenance Due  Topic Date Due  . PNA vac Low Risk Adult (1 of 2 - PCV13) 07/10/1989  . INFLUENZA VACCINE  Completed  . DEXA SCAN  Completed   Fall Risk  05/14/2019  Falls in the past year? 0  Number falls in past yr: 0   Functional Status Survey:    There were no vitals filed for this visit. There is no height or weight on file to calculate BMI. Physical Exam Vitals and nursing note reviewed.  Constitutional:      General: She is not in acute distress.    Appearance: Normal appearance. She is not ill-appearing, toxic-appearing or diaphoretic.  HENT:     Head: Normocephalic and atraumatic.     Nose: Nose normal.     Mouth/Throat:     Mouth: Mucous membranes are moist.  Eyes:     Extraocular Movements: Extraocular movements intact.       Conjunctiva/sclera: Conjunctivae normal.     Pupils: Pupils are equal, round, and reactive to light.  Cardiovascular:     Rate and Rhythm: Normal rate and regular rhythm.     Heart sounds: No murmur.  Pulmonary:     Effort: Pulmonary effort is normal. No respiratory distress.     Breath sounds: No wheezing, rhonchi or rales.  Chest:     Chest wall: No tenderness.  Abdominal:     General: Bowel sounds are normal. There is no distension.     Palpations: Abdomen is soft.     Tenderness: There is no abdominal tenderness. There is no right CVA tenderness, left CVA tenderness, guarding or rebound.  Musculoskeletal:     Cervical back: Normal range of motion and neck supple.     Right lower leg: Edema present.     Left lower leg: No edema.     Comments: Trace edema RLE.   Skin:  General: Skin is warm and dry.     Findings: Bruising present.     Comments: Skin tear is well approximated with steri strips anterior right lower leg above the ankle, no s/s infection of active bleeding.   Neurological:     General: No focal deficit present.     Mental Status: She is alert and oriented to person, place, and time. Mental status is at baseline.     Motor: No weakness.     Coordination: Coordination normal.     Gait: Gait abnormal.  Psychiatric:        Mood and Affect: Mood normal.        Behavior: Behavior normal.        Thought Content: Thought content normal.        Judgment: Judgment normal.     Labs reviewed: Recent Labs    04/20/19 1122 04/23/19 0000 04/26/19 0414  NA 143 142 141  K 4.0 4.1 4.1  CL 105 108 106  CO2 27 27* 26  GLUCOSE 91  --  109*  BUN 15 19 12   CREATININE 0.96 0.7 0.80  CALCIUM 8.0* 8.3* 7.8*   Recent Labs    04/23/19 0000 04/26/19 0414  AST 21 33  ALT 15 23  ALKPHOS 67 59  BILITOT  --  0.6  PROT  --  4.5*  ALBUMIN 3.1* 2.6*   Recent Labs    04/20/19 1122 04/23/19 0000 04/26/19 0414  WBC 8.1 3.8 5.5  NEUTROABS 7.1 2,569 4.1  HGB 14.0  13.4 15.3*  HCT 43.5 39 47.5*  MCV 98.2  --  99.8  PLT 322 281 226   No results found for: TSH No results found for: HGBA1C No results found for: CHOL, HDL, LDLCALC, LDLDIRECT, TRIG, CHOLHDL  Significant Diagnostic Results in last 30 days:  DG Chest Port 1 View  Result Date: 04/20/2019 CLINICAL DATA:  COVID-19 infection. Patient tested positive 2 weeks ago. Chest pain and tachycardia today. EXAM: PORTABLE CHEST 1 VIEW COMPARISON:  None. FINDINGS: The heart is mildly enlarged. There is no edema or effusion. Atherosclerotic changes are present at the aortic arch. Lung volumes scratched at the lungs are hyperexpanded. Ill-defined airspace opacities are present in the right upper lobe and right lower lobe. The left lung is clear. IMPRESSION: 1. Ill-defined airspace disease in the right upper lobe and right lower lobe compatible with pneumonia. 2. Emphysema. 3. Mild cardiomegaly without failure. 4. Aortic atherosclerosis. Electronically Signed   By: San Morelle M.D.   On: 04/20/2019 13:17    Assessment/Plan: Pneumonia due to COVID-19 virus Asymptomatic, regained physical strength, ADL function. She is safety to return her apartment at [facility]. F/u PCP in 2-4 weeks.   Paroxysmal SVT (supraventricular tachycardia) (HCC) Heart rate is wnl, no further recurrence of tachycardia.   Chronic diarrhea Continue prn Imodium.   Skin tear of right lower leg without complication Anterior right lower leg above the ankle, steri strips closure is intact, no active bleeding or s/s of infection, continue f/u clinic nurse for wound care.     Family/ staff Communication: plan of care reviewed with the patient and charge nurse. F/u in clinic [facility] 2-4 weeks.   Labs/tests ordered:  None

## 2019-05-13 NOTE — Progress Notes (Signed)
A user error has taken place: encounter opened in error, closed for administrative reasons.

## 2019-05-14 ENCOUNTER — Other Ambulatory Visit: Payer: Self-pay

## 2019-05-14 ENCOUNTER — Encounter: Payer: Self-pay | Admitting: Nurse Practitioner

## 2019-05-14 ENCOUNTER — Non-Acute Institutional Stay: Payer: Medicare Other | Admitting: Nurse Practitioner

## 2019-05-14 VITALS — BP 126/58 | HR 58 | Temp 97.1°F | Ht 60.0 in | Wt 86.4 lb

## 2019-05-14 DIAGNOSIS — R1313 Dysphagia, pharyngeal phase: Secondary | ICD-10-CM | POA: Diagnosis not present

## 2019-05-14 DIAGNOSIS — S81811D Laceration without foreign body, right lower leg, subsequent encounter: Secondary | ICD-10-CM | POA: Diagnosis not present

## 2019-05-14 DIAGNOSIS — K219 Gastro-esophageal reflux disease without esophagitis: Secondary | ICD-10-CM

## 2019-05-14 DIAGNOSIS — N3946 Mixed incontinence: Secondary | ICD-10-CM | POA: Diagnosis not present

## 2019-05-14 DIAGNOSIS — M6281 Muscle weakness (generalized): Secondary | ICD-10-CM | POA: Diagnosis not present

## 2019-05-14 DIAGNOSIS — R2681 Unsteadiness on feet: Secondary | ICD-10-CM | POA: Diagnosis not present

## 2019-05-14 DIAGNOSIS — R41841 Cognitive communication deficit: Secondary | ICD-10-CM | POA: Diagnosis not present

## 2019-05-14 DIAGNOSIS — R29898 Other symptoms and signs involving the musculoskeletal system: Secondary | ICD-10-CM | POA: Diagnosis not present

## 2019-05-14 DIAGNOSIS — S81811A Laceration without foreign body, right lower leg, initial encounter: Secondary | ICD-10-CM | POA: Insufficient documentation

## 2019-05-14 MED ORDER — OMEPRAZOLE 20 MG PO CPDR: 20.0000 mg | DELAYED_RELEASE_CAPSULE | Freq: Every day | ORAL | 3 refills | Status: DC

## 2019-05-14 NOTE — Progress Notes (Signed)
Location:   clinic [place]   Place of Service:  Clinic (12) Provider: Marlana Latus NP  Code Status: DNR Goals of Care: [place] Advanced Directives 05/13/2019  Does Patient Have a Medical Advance Directive? Yes  Type of Paramedic of [place];Living will;Out of facility DNR (pink MOST or yellow form)  Does patient want to make changes to medical advance directive? No - Patient declined  Copy of [place] in Chart? Yes - validated most recent copy scanned in chart (See row information)  Pre-existing out of facility DNR order (yellow form or pink MOST form) Yellow form placed in chart (order not valid for inpatient use)     Chief Complaint  Patient presents with  . Acute Visit    Dysphagia    HPI: Patient is a 86 y.o. female seen today for   c/o painful swallowing, no redness or lesion oral/throat, bedside swallowing test of water w/o pain or cough. The patient denied chest pain/pressure, palpitation, nausea, vomiting, or abd pain. She denied sore throat, cough, or SOB.    Past Medical History:  Diagnosis Date  . COVID-19   . 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 No pertinent surgical history.  Allergies  Allergen Reactions  . Azopt [Brinzolamide]   . Brimonidine   . Lumigan [Bimatoprost]     Allergies as of 05/14/2019      Reactions   Azopt [brinzolamide]    Brimonidine    Lumigan [bimatoprost]       Medication List       Accurate as of May 14, 2019  5:11 PM. If you have any questions, ask your nurse or doctor.        aspirin EC 81 MG tablet Take 81 mg by mouth daily. 7am-10am.   b complex vitamins tablet Take 1 tablet by mouth daily. 7am-10am.   Calcium Carbonate-Vitamin D 600-400 MG-UNIT tablet Take 1 tablet by mouth daily.   CENTRUM SILVER PO Take 1 tablet by mouth daily. 7am-10am.   diphenoxylate-atropine 2.5-0.025 MG  tablet Commonly known as: LOMOTIL Take by mouth 3 (three) times daily as needed for diarrhea or loose stools.   NON FORMULARY Moisturizing Cream to extremities with morning and evening care Every Shift   NON FORMULARY Regular Special Instructions: Regular Diet, Thin Liquids   omeprazole 20 MG capsule Commonly known as: PRILOSEC Take 1 capsule (20 mg total) by mouth daily. Started by: Quang Thorpe X Alynn Ellithorpe, NP   timolol 0.5 % ophthalmic solution Commonly known as: BETIMOL Place 1 drop into both eyes daily. 7am-10am.   trimethoprim-polymyxin b ophthalmic solution Commonly known as: POLYTRIM Place 1 drop into both eyes in the morning, at noon, in the evening, and at bedtime.       Review of Systems:  Review of Systems  Constitutional: Negative for activity change, appetite change, chills, diaphoresis, fatigue and fever.  HENT: Positive for hearing loss and trouble swallowing. Negative for congestion and voice change.   Eyes: Negative for visual disturbance.  Respiratory: Negative for cough, shortness of breath and wheezing.   Cardiovascular: Positive for leg swelling. Negative for chest pain and palpitations.  Gastrointestinal: Negative for abdominal distention, abdominal pain, constipation, diarrhea, nausea and vomiting.  Genitourinary: Positive for frequency. Negative for difficulty urinating, dysuria and urgency.  Musculoskeletal: Positive for gait problem.  Skin: Positive for wound.       Right lowe leg.   Neurological: Negative for  dizziness, speech difficulty, weakness and headaches.  Psychiatric/Behavioral: Negative for agitation, behavioral problems, hallucinations and sleep disturbance. The patient is not nervous/anxious.     Health Maintenance  Topic Date Due  . TETANUS/TDAP  07/11/1941  . PNA vac Low Risk Adult (1 of 2 - PCV13) 07/10/1989  . INFLUENZA VACCINE  Completed  . DEXA SCAN  Completed    Physical Exam: Vitals:   05/14/19 1506  BP: (!) 126/58  Pulse: (!)  58  Temp: (!) 97.1 F (36.2 C)  SpO2: 94%  Weight: 86 lb 6.4 oz (39.2 kg)  Height: 5' (1.524 m)   Body mass index is 16.87 kg/m. Physical Exam Vitals and nursing note reviewed.  Constitutional:      General: She is not in acute distress.    Appearance: Normal appearance. She is not ill-appearing, toxic-appearing or diaphoretic.  HENT:     Head: Normocephalic and atraumatic.     Nose: Nose normal.     Mouth/Throat:     Mouth: Mucous membranes are moist.  Eyes:     Extraocular Movements: Extraocular movements intact.     Conjunctiva/sclera: Conjunctivae normal.     Pupils: Pupils are equal, round, and reactive to light.  Cardiovascular:     Rate and Rhythm: Normal rate and regular rhythm.     Heart sounds: No murmur.  Pulmonary:     Breath sounds: No wheezing, rhonchi or rales.  Abdominal:     General: Bowel sounds are normal. There is no distension.     Palpations: Abdomen is soft.     Tenderness: There is no abdominal tenderness. There is no right CVA tenderness, left CVA tenderness, guarding or rebound.  Musculoskeletal:     Cervical back: Normal range of motion and neck supple.     Right lower leg: Edema present.     Left lower leg: No edema.     Comments: Trace edema RLE  Skin:    General: Skin is warm and dry.     Findings: Erythema present.     Comments: Anterior right lower leg skin tear is approximated with steri strips, mild erythematous peri wound, no s/s of infection.   Neurological:     General: No focal deficit present.     Mental Status: She is alert and oriented to person, place, and time. Mental status is at baseline.     Motor: No weakness.     Coordination: Coordination normal.     Gait: Gait abnormal.  Psychiatric:        Mood and Affect: Mood normal.        Behavior: Behavior normal.        Thought Content: Thought content normal.     Labs reviewed: Basic Metabolic Panel: Recent Labs    04/20/19 1122 04/23/19 0000 04/26/19 0414  NA 143  142 141  K 4.0 4.1 4.1  CL 105 108 106  CO2 27 27* 26  GLUCOSE 91  --  109*  BUN 15 19 12   CREATININE 0.96 0.7 0.80  CALCIUM 8.0* 8.3* 7.8*   Liver Function Tests: Recent Labs    04/23/19 0000 04/26/19 0414  AST 21 33  ALT 15 23  ALKPHOS 67 59  BILITOT  --  0.6  PROT  --  4.5*  ALBUMIN 3.1* 2.6*   No results for input(s): LIPASE, AMYLASE in the last 8760 hours. No results for input(s): AMMONIA in the last 8760 hours. CBC: Recent Labs    04/20/19 1122 04/23/19 0000 04/26/19  0414  WBC 8.1 3.8 5.5  NEUTROABS 7.1 2,569 4.1  HGB 14.0 13.4 15.3*  HCT 43.5 39 47.5*  MCV 98.2  --  99.8  PLT 322 281 226   Lipid Panel: No results for input(s): CHOL, HDL, LDLCALC, TRIG, CHOLHDL, LDLDIRECT in the last 8760 hours. No results found for: HGBA1C  Procedures since last visit: DG Chest Port 1 View  Result Date: 04/20/2019 CLINICAL DATA:  COVID-19 infection. Patient tested positive 2 weeks ago. Chest pain and tachycardia today. EXAM: PORTABLE CHEST 1 VIEW COMPARISON:  None. FINDINGS: The heart is mildly enlarged. There is no edema or effusion. Atherosclerotic changes are present at the aortic arch. Lung volumes scratched at the lungs are hyperexpanded. Ill-defined airspace opacities are present in the right upper lobe and right lower lobe. The left lung is clear. IMPRESSION: 1. Ill-defined airspace disease in the right upper lobe and right lower lobe compatible with pneumonia. 2. Emphysema. 3. Mild cardiomegaly without failure. 4. Aortic atherosclerosis. Electronically Signed   By: San Morelle M.D.   On: 04/20/2019 13:17    Assessment/Plan  GERD (gastroesophageal reflux disease) The patient c/o painful swallowing, no redness or lesion oral/throat, bedside swallowing test of water w/o pain or cough. The patient was instructed to eat soft food. Pending ST evaluation report. May consider GI referral is no better. Trial of Omeprazole 20mg  qd for now.   Skin tear of right lower  leg without complication Anterior above the right ankle, mild swelling in the area, no s/s infection, steri strips closure is intact. Observe.    Labs/tests ordered:  None  Next appt:  4 weeks.

## 2019-05-14 NOTE — Patient Instructions (Addendum)
The patient is recommended to eat soft food, taking Omeprazole by mouth daily, observe for "painful swallowing". May need GI consultation if no better. F/u in clinic [place] in 4 weeks.

## 2019-05-14 NOTE — Assessment & Plan Note (Signed)
The patient c/o painful swallowing, no redness or lesion oral/throat, bedside swallowing test of water w/o pain or cough. The patient was instructed to eat soft food. Pending ST evaluation report. May consider GI referral is no better. Trial of Omeprazole 20mg  qd for now.

## 2019-05-14 NOTE — Assessment & Plan Note (Signed)
Anterior above the right ankle, mild swelling in the area, no s/s infection, steri strips closure is intact. Observe.

## 2019-05-15 ENCOUNTER — Encounter: Payer: Self-pay | Admitting: Nurse Practitioner

## 2019-05-15 DIAGNOSIS — R41841 Cognitive communication deficit: Secondary | ICD-10-CM | POA: Diagnosis not present

## 2019-05-15 DIAGNOSIS — N3946 Mixed incontinence: Secondary | ICD-10-CM | POA: Diagnosis not present

## 2019-05-15 DIAGNOSIS — R2681 Unsteadiness on feet: Secondary | ICD-10-CM | POA: Diagnosis not present

## 2019-05-15 DIAGNOSIS — R1313 Dysphagia, pharyngeal phase: Secondary | ICD-10-CM | POA: Diagnosis not present

## 2019-05-15 DIAGNOSIS — M6281 Muscle weakness (generalized): Secondary | ICD-10-CM | POA: Diagnosis not present

## 2019-05-15 DIAGNOSIS — R29898 Other symptoms and signs involving the musculoskeletal system: Secondary | ICD-10-CM | POA: Diagnosis not present

## 2019-05-15 NOTE — Assessment & Plan Note (Signed)
Continue prn Imodium.

## 2019-05-15 NOTE — Assessment & Plan Note (Signed)
Heart rate is wnl, no further recurrence of tachycardia.

## 2019-05-15 NOTE — Assessment & Plan Note (Signed)
Anterior right lower leg above the ankle, steri strips closure is intact, no active bleeding or s/s of infection, continue f/u clinic nurse for wound care.

## 2019-05-15 NOTE — Assessment & Plan Note (Signed)
Asymptomatic, regained physical strength, ADL function. She is safety to return her apartment at [facility]. F/u PCP in 2-4 weeks.

## 2019-05-18 DIAGNOSIS — M6281 Muscle weakness (generalized): Secondary | ICD-10-CM | POA: Diagnosis not present

## 2019-05-18 DIAGNOSIS — R29898 Other symptoms and signs involving the musculoskeletal system: Secondary | ICD-10-CM | POA: Diagnosis not present

## 2019-05-18 DIAGNOSIS — R41841 Cognitive communication deficit: Secondary | ICD-10-CM | POA: Diagnosis not present

## 2019-05-18 DIAGNOSIS — R2681 Unsteadiness on feet: Secondary | ICD-10-CM | POA: Diagnosis not present

## 2019-05-18 DIAGNOSIS — R1313 Dysphagia, pharyngeal phase: Secondary | ICD-10-CM | POA: Diagnosis not present

## 2019-05-18 DIAGNOSIS — N3946 Mixed incontinence: Secondary | ICD-10-CM | POA: Diagnosis not present

## 2019-05-19 ENCOUNTER — Telehealth (INDEPENDENT_AMBULATORY_CARE_PROVIDER_SITE_OTHER): Payer: Medicare Other | Admitting: Family

## 2019-05-19 ENCOUNTER — Encounter: Payer: Self-pay | Admitting: Family

## 2019-05-19 ENCOUNTER — Other Ambulatory Visit: Payer: Self-pay

## 2019-05-19 DIAGNOSIS — L03115 Cellulitis of right lower limb: Secondary | ICD-10-CM

## 2019-05-19 DIAGNOSIS — S81811D Laceration without foreign body, right lower leg, subsequent encounter: Secondary | ICD-10-CM | POA: Diagnosis not present

## 2019-05-19 MED ORDER — DOXYCYCLINE HYCLATE 100 MG PO TABS: 100.0000 mg | ORAL_TABLET | Freq: Two times a day (BID) | ORAL | 0 refills | Status: AC

## 2019-05-19 MED ORDER — SACCHAROMYCES BOULARDII 250 MG PO CAPS: 250.0000 mg | ORAL_CAPSULE | Freq: Two times a day (BID) | ORAL | 0 refills | Status: AC

## 2019-05-19 NOTE — Progress Notes (Signed)
This service is provided via telemedicine  No vital signs collected/recorded due to the encounter was a telemedicine visit.   Location of patient (ex: home, work):  Home, [place]   Patient consents to a telephone visit:  Yes   Location of the provider (ex: office, home):  [employer], Office   Name of any referring provider:  [name], [name], NP  Names of all persons participating in the telemedicine service and their role in the encounter:  Marlowe Sax, NP, Edwin Dada, CMA, Scientist, research (medical) for [place]),and Patient  Time spent on call:  7 min with medical assistant    Provider: Webb Silversmith Annalise Mcdiarmid FNP-C  [name], [name], NP  Patient Care Team: [name], [name], NP as PCP - General (Internal Medicine)  Extended Emergency Contact Information Primary Emergency Contact: [facility] Address: [place],  [place] Home Phone: [phone] Relation: None Secondary Emergency Contact: [name] Mobile Phone: [phone] Relation: Friend  Code Status:  DNR Goals of care: Advanced Directive information Advanced Directives 05/13/2019  Does Patient Have a Medical Advance Directive? Yes  Type of Paramedic of [place];Living will;Out of facility DNR (pink MOST or yellow form)  Does patient want to make changes to medical advance directive? No - Patient declined  Copy of [place] in Chart? Yes - validated most recent copy scanned in chart (See row information)  Pre-existing out of facility DNR order (yellow form or pink MOST form) Yellow form placed in chart (order not valid for inpatient use)     Chief Complaint  Patient presents with  . Acute Visit    Possible cellulitis on left lower leg. Telehealth/video visit    HPI:  [name] seen today for an acute visit for evaluation of right leg cellulitis.Facility Independent Living Nurse  [place] Present during video  call.she states was getting onto a bus on Thursday 05/14/2019 when she missed a step and scraped her right leg.she sustained a skin tear.Steri-strips was applied.she complains of soreness and redness on the leg.she denies any fever,chills or drainage from the leg.    Past Medical History:  Diagnosis Date  . COVID-19   . 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 No pertinent surgical history.  Allergies  Allergen Reactions  . Azopt [Brinzolamide]   . Brimonidine   . Lumigan [Bimatoprost]     Outpatient Encounter Medications as of 05/19/2019  Medication Sig  . aspirin 325 MG EC tablet Take 325 mg by mouth as needed for pain.  Marland Kitchen aspirin EC 81 MG tablet Take 81 mg by mouth as needed. 7am-10am.  . b complex vitamins tablet Take 1 tablet by mouth as needed.   . Calcium Carbonate-Vitamin D 600-400 MG-UNIT tablet Take 1 tablet by mouth daily.  . diphenoxylate-atropine (LOMOTIL) 2.5-0.025 MG tablet Take by mouth 3 (three) times daily as needed for diarrhea or loose stools.  . Multiple Vitamins-Minerals (CENTRUM SILVER PO) Take 1 tablet by mouth daily. 7am-10am.  . NON FORMULARY Moisturizing Cream to extremities with morning and evening care Every Shift  . NON FORMULARY Regular Special Instructions: Regular Diet, Thin Liquids  . omeprazole (PRILOSEC) 20 MG capsule Take 1 capsule (20 mg total) by mouth daily.  . timolol (BETIMOL) 0.5 % ophthalmic solution Place 1 drop into both eyes daily. 7am-10am.  . trimethoprim-polymyxin b (POLYTRIM)  ophthalmic solution Place 1 drop into both eyes in the morning, at noon, in the evening, and at bedtime.   No facility-administered encounter medications on file as of 05/19/2019.    Review of Systems  Constitutional: Negative for chills, fatigue and fever.  Respiratory: Negative for cough, chest tightness, shortness of breath and wheezing.   Cardiovascular: Positive for leg swelling.  Negative for chest pain and palpitations.  Skin: Positive for wound. Negative for pallor and rash.       Skin tear to right shin.  Neurological: Negative for weakness and numbness.    Immunization History  Administered Date(s) Administered  . Influenza, High Dose Seasonal PF 11/08/2018  . Influenza-Unspecified 11/07/2013   Pertinent  Health Maintenance Due  Topic Date Due  . PNA vac Low Risk Adult (1 of 2 - PCV13) 07/10/1989  . INFLUENZA VACCINE  Completed  . DEXA SCAN  Completed   Fall Risk  05/19/2019 05/14/2019  Falls in the past year? 0 0  Number falls in past yr: 0 0  Injury with Fall? 0 -   Functional Status Survey:    There were no vitals filed for this visit. There is no height or weight on file to calculate BMI. Physical Exam Constitutional:      General: She is not in acute distress.    Appearance: She is not ill-appearing.  HENT:     Ears:     Comments: HOH Pulmonary:     Effort: Pulmonary effort is normal.  Musculoskeletal:     Comments: Right lower leg pitting edema 1-2 + and tender when pressured by Nurse.   Skin:    Coloration: Skin is not pale.     Comments: Right leg shin area skin tear with steri-strips clean and intact.lower leg erythema and swelling.Nurse pressure leg during visit tender to touch.   Neurological:     Mental Status: She is alert and oriented to person, place, and time.     Motor: No weakness.  Psychiatric:        Mood and Affect: Mood normal.        Thought Content: Thought content normal.        Judgment: Judgment normal.     Labs reviewed: Recent Labs    04/20/19 1122 04/23/19 0000 04/26/19 0414  NA 143 142 141  K 4.0 4.1 4.1  CL 105 108 106  CO2 27 27* 26  GLUCOSE 91  --  109*  BUN 15 19 12   CREATININE 0.96 0.7 0.80  CALCIUM 8.0* 8.3* 7.8*   Recent Labs    04/23/19 0000 04/26/19 0414  AST 21 33  ALT 15 23  ALKPHOS 67 59  BILITOT  --  0.6  PROT  --  4.5*  ALBUMIN 3.1* 2.6*   Recent Labs    04/20/19 1122  04/23/19 0000 04/26/19 0414  WBC 8.1 3.8 5.5  NEUTROABS 7.1 2,569 4.1  HGB 14.0 13.4 15.3*  HCT 43.5 39 47.5*  MCV 98.2  --  99.8  PLT 322 281 226   No results found for: TSH No results found for: HGBA1C No results found for: CHOL, HDL, LDLCALC, LDLDIRECT, TRIG, CHOLHDL  Significant Diagnostic Results in last 30 days:  DG Chest Port 1 View  Result Date: 04/20/2019 CLINICAL DATA:  COVID-19 infection. Patient tested positive 2 weeks ago. Chest pain and tachycardia today. EXAM: PORTABLE CHEST 1 VIEW COMPARISON:  None. FINDINGS: The heart is mildly enlarged. There is no edema or effusion. Atherosclerotic changes are  present at the aortic arch. Lung volumes scratched at the lungs are hyperexpanded. Ill-defined airspace opacities are present in the right upper lobe and right lower lobe. The left lung is clear. IMPRESSION: 1. Ill-defined airspace disease in the right upper lobe and right lower lobe compatible with pneumonia. 2. Emphysema. 3. Mild cardiomegaly without failure. 4. Aortic atherosclerosis. Electronically Signed   By: San Morelle M.D.   On: 04/20/2019 13:17    Assessment/Plan 1. Cellulitis of right leg Erythema,swelling and tender with touched noted when pressured by facility Nurse. - will start on doxycycline 100 mg tablet twice daily advised to take along with Florastor 250 mg capsule twice daily to prevent antibiotic associated diarrhea.verbalized understanding. Antibiotic education information provided on AVS to be mailed to patient.  - doxycycline (VIBRA-TABS) 100 MG tablet; Take 1 tablet (100 mg total) by mouth 2 (two) times daily for 7 days.  Dispense: 14 tablet; Refill: 0 - saccharomyces boulardii (FLORASTOR) 250 MG capsule; Take 1 capsule (250 mg total) by mouth 2 (two) times daily for 10 days.  Dispense: 20 capsule; Refill: 0 - Notify provider if symptoms worsen,running any fever > 100.0 or symptoms fail to improve   2. Skin tear of right lower leg without  complication, subsequent encounter Steri-strips intact no drainage noted.continue to monitor for any signs of infection.lower leg redness as above.antibiotics as above.  Family/ staff Communication: Reviewed plan of care with patient  Labs/tests ordered: None   Next Appointment: PRN if symptoms worsen or fail to improve  Spent 15 minutes of face to face with patient    Sandrea Hughs, NP

## 2019-05-19 NOTE — Patient Instructions (Addendum)
- Take Doxycycline 100 mg tablet one by mouth twice daily x 7 days for right leg cellulitis  - Take Florastor 250 mg capsule one by mouth twice daily x 10 days to prevent antibiotic associated diarrhea. - Notify provider if symptoms worsen,running any fever > 100.0 or symptoms fail to improve  Doxycycline delayed-release tablets What is this medicine? DOXYCYCLINE (dox i SYE kleen) is a tetracycline antibiotic. It kills certain bacteria or stops their growth. It is used to treat many kinds of infections, like dental, skin, respiratory, and urinary tract infections. It also treats acne, Lyme disease, malaria, and certain sexually transmitted infections. This medicine may be used for other purposes; ask your health care provider or pharmacist if you have questions. COMMON BRAND NAME(S): Doryx What should I tell my health care provider before I take this medicine? They need to know if you have any of these conditions:  bowel disease like colitis  liver disease  long exposure to sunlight like working outdoors  an unusual or allergic reaction to doxycycline, tetracycline antibiotics, other medicines, foods, dyes, or preservatives  pregnant or trying to get pregnant  breast-feeding How should I use this medicine? Take this medicine by mouth with a full glass of water. Follow the directions on the prescription label. Do not crush or chew. It is best to take this medicine without food, but if it upsets your stomach take it with food. Take your medicine at regular intervals. Do not take your medicine more often than directed. Take all of your medicine as directed even if you think your are better. Do not skip doses or stop your medicine early. Talk to your pediatrician regarding the use of this medicine in children. While this drug may be prescribed for selected conditions, precautions do apply. Overdosage: If you think you have taken too much of this medicine contact a poison control center or  emergency room at once. NOTE: This medicine is only for you. Do not share this medicine with others. What if I miss a dose? If you miss a dose, take it as soon as you can. If it is almost time for your next dose, take only that dose. Do not take double or extra doses. What may interact with this medicine?  antacids  barbiturates  birth control pills  bismuth subsalicylate  carbamazepine  methoxyflurane  other antibiotics  phenytoin  vitamins that contain iron  warfarin This list may not describe all possible interactions. Give your health care provider a list of all the medicines, herbs, non-prescription drugs, or dietary supplements you use. Also tell them if you smoke, drink alcohol, or use illegal drugs. Some items may interact with your medicine. What should I watch for while using this medicine? Tell your doctor or health care professional if your symptoms do not improve. Do not treat diarrhea with over the counter products. Contact your doctor if you have diarrhea that lasts more than 2 days or if it is severe and watery. Do not take this medicine just before going to bed. It may not dissolve properly when you lay down and can cause pain in your throat. Drink plenty of fluids while taking this medicine to also help reduce irritation in your throat. This medicine can make you more sensitive to the sun. Keep out of the sun. If you cannot avoid being in the sun, wear protective clothing and use sunscreen. Do not use sun lamps or tanning beds/booths. Birth control pills may not work properly while you are taking  this medicine. Talk to your doctor about using an extra method of birth control. If you are being treated for a sexually transmitted infection, avoid sexual contact until you have finished your treatment. Your sexual partner may also need treatment. Avoid antacids, aluminum, calcium, magnesium, and iron products for 4 hours before and 2 hours after taking a dose of this  medicine. If you are using this medicine to prevent malaria, you should still protect yourself from contact with mosquitos. Stay in screened-in areas, use mosquito nets, keep your body covered, and use an insect repellent. What side effects may I notice from receiving this medicine? Side effects that you should report to your doctor or health care professional as soon as possible:  allergic reactions like skin rash, itching or hives, swelling of the face, lips, or tongue  difficulty breathing  fever  itching in the rectal or genital area  pain on swallowing  rash, fever, and swollen lymph nodes  redness, blistering, peeling or loosening of the skin, including inside the mouth  severe stomach pain or cramps  unusual bleeding or bruising  unusually weak or tired  yellowing of the eyes or skin Side effects that usually do not require medical attention (report to your doctor or health care professional if they continue or are bothersome):  diarrhea  loss of appetite  nausea, vomiting This list may not describe all possible side effects. Call your doctor for medical advice about side effects. You may report side effects to FDA at 1-800-FDA-1088. Where should I keep my medicine? Keep out of the reach of children. Store at room temperature between 15 and 30 degrees C (59 and 86 degrees F). Protect from light. Keep container tightly closed. Throw away any unused medicine after the expiration date. Taking this medicine after the expiration date can make you seriously ill. NOTE: This sheet is a summary. It may not cover all possible information. If you have questions about this medicine, talk to your doctor, pharmacist, or health care provider.  2020 Elsevier/Gold Standard (2018-06-26 13:26:47)

## 2019-05-20 DIAGNOSIS — N3946 Mixed incontinence: Secondary | ICD-10-CM | POA: Diagnosis not present

## 2019-05-20 DIAGNOSIS — M6281 Muscle weakness (generalized): Secondary | ICD-10-CM | POA: Diagnosis not present

## 2019-05-20 DIAGNOSIS — R2681 Unsteadiness on feet: Secondary | ICD-10-CM | POA: Diagnosis not present

## 2019-05-20 DIAGNOSIS — R41841 Cognitive communication deficit: Secondary | ICD-10-CM | POA: Diagnosis not present

## 2019-05-20 DIAGNOSIS — R1313 Dysphagia, pharyngeal phase: Secondary | ICD-10-CM | POA: Diagnosis not present

## 2019-05-20 DIAGNOSIS — R29898 Other symptoms and signs involving the musculoskeletal system: Secondary | ICD-10-CM | POA: Diagnosis not present

## 2019-05-21 DIAGNOSIS — R1313 Dysphagia, pharyngeal phase: Secondary | ICD-10-CM | POA: Diagnosis not present

## 2019-05-21 DIAGNOSIS — M6281 Muscle weakness (generalized): Secondary | ICD-10-CM | POA: Diagnosis not present

## 2019-05-21 DIAGNOSIS — R41841 Cognitive communication deficit: Secondary | ICD-10-CM | POA: Diagnosis not present

## 2019-05-21 DIAGNOSIS — N3946 Mixed incontinence: Secondary | ICD-10-CM | POA: Diagnosis not present

## 2019-05-21 DIAGNOSIS — R29898 Other symptoms and signs involving the musculoskeletal system: Secondary | ICD-10-CM | POA: Diagnosis not present

## 2019-05-21 DIAGNOSIS — R2681 Unsteadiness on feet: Secondary | ICD-10-CM | POA: Diagnosis not present

## 2019-05-22 DIAGNOSIS — R2681 Unsteadiness on feet: Secondary | ICD-10-CM | POA: Diagnosis not present

## 2019-05-22 DIAGNOSIS — M6281 Muscle weakness (generalized): Secondary | ICD-10-CM | POA: Diagnosis not present

## 2019-05-22 DIAGNOSIS — R41841 Cognitive communication deficit: Secondary | ICD-10-CM | POA: Diagnosis not present

## 2019-05-22 DIAGNOSIS — N3946 Mixed incontinence: Secondary | ICD-10-CM | POA: Diagnosis not present

## 2019-05-22 DIAGNOSIS — R29898 Other symptoms and signs involving the musculoskeletal system: Secondary | ICD-10-CM | POA: Diagnosis not present

## 2019-05-22 DIAGNOSIS — R1313 Dysphagia, pharyngeal phase: Secondary | ICD-10-CM | POA: Diagnosis not present

## 2019-05-25 DIAGNOSIS — N3946 Mixed incontinence: Secondary | ICD-10-CM | POA: Diagnosis not present

## 2019-05-25 DIAGNOSIS — M6281 Muscle weakness (generalized): Secondary | ICD-10-CM | POA: Diagnosis not present

## 2019-05-25 DIAGNOSIS — R29898 Other symptoms and signs involving the musculoskeletal system: Secondary | ICD-10-CM | POA: Diagnosis not present

## 2019-05-25 DIAGNOSIS — R2681 Unsteadiness on feet: Secondary | ICD-10-CM | POA: Diagnosis not present

## 2019-05-25 DIAGNOSIS — R41841 Cognitive communication deficit: Secondary | ICD-10-CM | POA: Diagnosis not present

## 2019-05-25 DIAGNOSIS — R1313 Dysphagia, pharyngeal phase: Secondary | ICD-10-CM | POA: Diagnosis not present

## 2019-05-27 DIAGNOSIS — M6281 Muscle weakness (generalized): Secondary | ICD-10-CM | POA: Diagnosis not present

## 2019-05-27 DIAGNOSIS — R41841 Cognitive communication deficit: Secondary | ICD-10-CM | POA: Diagnosis not present

## 2019-05-27 DIAGNOSIS — N3946 Mixed incontinence: Secondary | ICD-10-CM | POA: Diagnosis not present

## 2019-05-27 DIAGNOSIS — R2681 Unsteadiness on feet: Secondary | ICD-10-CM | POA: Diagnosis not present

## 2019-05-27 DIAGNOSIS — R1313 Dysphagia, pharyngeal phase: Secondary | ICD-10-CM | POA: Diagnosis not present

## 2019-05-27 DIAGNOSIS — R29898 Other symptoms and signs involving the musculoskeletal system: Secondary | ICD-10-CM | POA: Diagnosis not present

## 2019-05-28 DIAGNOSIS — R1313 Dysphagia, pharyngeal phase: Secondary | ICD-10-CM | POA: Diagnosis not present

## 2019-05-28 DIAGNOSIS — N3946 Mixed incontinence: Secondary | ICD-10-CM | POA: Diagnosis not present

## 2019-05-28 DIAGNOSIS — R29898 Other symptoms and signs involving the musculoskeletal system: Secondary | ICD-10-CM | POA: Diagnosis not present

## 2019-05-28 DIAGNOSIS — R2681 Unsteadiness on feet: Secondary | ICD-10-CM | POA: Diagnosis not present

## 2019-05-28 DIAGNOSIS — M6281 Muscle weakness (generalized): Secondary | ICD-10-CM | POA: Diagnosis not present

## 2019-05-28 DIAGNOSIS — R41841 Cognitive communication deficit: Secondary | ICD-10-CM | POA: Diagnosis not present

## 2019-05-29 DIAGNOSIS — R41841 Cognitive communication deficit: Secondary | ICD-10-CM | POA: Diagnosis not present

## 2019-05-29 DIAGNOSIS — R29898 Other symptoms and signs involving the musculoskeletal system: Secondary | ICD-10-CM | POA: Diagnosis not present

## 2019-05-29 DIAGNOSIS — R2681 Unsteadiness on feet: Secondary | ICD-10-CM | POA: Diagnosis not present

## 2019-05-29 DIAGNOSIS — M6281 Muscle weakness (generalized): Secondary | ICD-10-CM | POA: Diagnosis not present

## 2019-05-29 DIAGNOSIS — N3946 Mixed incontinence: Secondary | ICD-10-CM | POA: Diagnosis not present

## 2019-05-29 DIAGNOSIS — R1313 Dysphagia, pharyngeal phase: Secondary | ICD-10-CM | POA: Diagnosis not present

## 2019-05-30 ENCOUNTER — Other Ambulatory Visit: Payer: Self-pay

## 2019-05-30 ENCOUNTER — Emergency Department (HOSPITAL_COMMUNITY)
Admission: EM | Admit: 2019-05-30 | Discharge: 2019-05-30 | Disposition: A | Discharge status: Home / Self Care | Payer: Medicare Other | Attending: Emergency Medicine | Admitting: Emergency Medicine

## 2019-05-30 ENCOUNTER — Emergency Department (HOSPITAL_COMMUNITY): Payer: Medicare Other

## 2019-05-30 DIAGNOSIS — Z79899 Other long term (current) drug therapy: Secondary | ICD-10-CM | POA: Diagnosis not present

## 2019-05-30 DIAGNOSIS — S299XXA Unspecified injury of thorax, initial encounter: Secondary | ICD-10-CM | POA: Diagnosis not present

## 2019-05-30 DIAGNOSIS — I471 Supraventricular tachycardia: Secondary | ICD-10-CM | POA: Diagnosis not present

## 2019-05-30 DIAGNOSIS — I959 Hypotension, unspecified: Secondary | ICD-10-CM | POA: Diagnosis not present

## 2019-05-30 DIAGNOSIS — Z7982 Long term (current) use of aspirin: Secondary | ICD-10-CM | POA: Diagnosis not present

## 2019-05-30 DIAGNOSIS — R279 Unspecified lack of coordination: Secondary | ICD-10-CM | POA: Diagnosis not present

## 2019-05-30 DIAGNOSIS — W19XXXA Unspecified fall, initial encounter: Secondary | ICD-10-CM | POA: Diagnosis not present

## 2019-05-30 DIAGNOSIS — R262 Difficulty in walking, not elsewhere classified: Secondary | ICD-10-CM | POA: Diagnosis not present

## 2019-05-30 DIAGNOSIS — R000 Tachycardia, unspecified: Secondary | ICD-10-CM | POA: Diagnosis not present

## 2019-05-30 DIAGNOSIS — R296 Repeated falls: Secondary | ICD-10-CM | POA: Diagnosis not present

## 2019-05-30 DIAGNOSIS — U071 COVID-19: Secondary | ICD-10-CM | POA: Diagnosis not present

## 2019-05-30 DIAGNOSIS — Z743 Need for continuous supervision: Secondary | ICD-10-CM | POA: Diagnosis not present

## 2019-05-30 DIAGNOSIS — R531 Weakness: Secondary | ICD-10-CM | POA: Diagnosis present

## 2019-05-30 DIAGNOSIS — Z8616 Personal history of COVID-19: Secondary | ICD-10-CM | POA: Insufficient documentation

## 2019-05-30 LAB — I-STAT CHEM 8, ED
BUN: 18 mg/dL (ref 8–23)
Calcium, Ion: 1 mmol/L — ABNORMAL LOW (ref 1.15–1.40)
Chloride: 109 mmol/L (ref 98–111)
Creatinine, Ser: 0.6 mg/dL (ref 0.44–1.00)
Glucose, Bld: 114 mg/dL — ABNORMAL HIGH (ref 70–99)
HCT: 42 % (ref 36.0–46.0)
Hemoglobin: 14.3 g/dL (ref 12.0–15.0)
Potassium: 3.3 mmol/L — ABNORMAL LOW (ref 3.5–5.1)
Sodium: 142 mmol/L (ref 135–145)
TCO2: 27 mmol/L (ref 22–32)

## 2019-05-30 NOTE — ED Triage Notes (Signed)
Pt here from [place]. Used call bell upon getting up this morning d/t feeling like she was going to fall. EMS called, HR 170, SVT. 250 cc NS bolus given with no change. 6 mg Adenosine given, converted to NSR. Pt denies chest pain or shob. After EMS started fluid and gave medication, facility produced a MOST form which states patient should not have any IV fluids.

## 2019-05-30 NOTE — ED Provider Notes (Addendum)
[facility] EMERGENCY DEPARTMENT Provider Note   CSN: [id number] Arrival date & time: 05/30/19  1117     History No chief complaint on file.   Bianca [name] is a 86 y.o. female.  HPI    \ 86 year old [facility] with report of SVT.  EMS was called out for generalized weakness.  EKG done at scene showed regular rhythm with rate of 150 with diffuse ST changes.  Patient received adenosine 6 mg prehospital and converted to a normal sinus rhythm.  Patient denies any chest pain or dyspnea.  She was Covid positive in early January.  She has recovered and denies any symptoms at this time.  She states that she felt generally weak earlier today when this occurred.  She feels somewhat improved at this time.  She received a fluid bolus via EMS.  They then noted that she had a MOST form that did not want IV fluids.  However, patient is awake and alert and appears able to make decisions.  Past Medical History:  Diagnosis Date  . COVID-19   . 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 GERD (gastroesophageal reflux disease) 05/14/2019  . Skin tear of right lower leg without complication 09/47/0962  . Paroxysmal SVT (supraventricular tachycardia) ([place]) 04/27/2019  . Pneumonia due to COVID-19 virus 04/27/2019  . Chronic diarrhea 04/27/2019  . Urinary frequency 04/27/2019  . Protein-calorie malnutrition ([place]) 04/27/2019  . Emphysema of lung ([place]) 04/27/2019    No past surgical history on file.   OB History   No obstetric history on file.     No family history on file.  Social History   Tobacco Use  . Smoking status: Never Smoker  . Smokeless tobacco: Never Used  Substance Use Topics  . Alcohol use: Never  . Drug use: Never    Home Medications Prior to Admission medications   Medication Sig Start Date End Date Taking? Authorizing  Provider  aspirin 325 MG EC tablet Take 325 mg by mouth as needed for pain.    [provider]  aspirin EC 81 MG tablet Take 81 mg by mouth as needed. 7am-10am.    [provider]  b complex vitamins tablet Take 1 tablet by mouth as needed.     [provider]  Calcium Carbonate-Vitamin D 600-400 MG-UNIT tablet Take 1 tablet by mouth daily.    [provider]  diphenoxylate-atropine (LOMOTIL) 2.5-0.025 MG tablet Take by mouth 3 (three) times daily as needed for diarrhea or loose stools.    [provider]  Multiple Vitamins-Minerals (CENTRUM SILVER PO) Take 1 tablet by mouth daily. 7am-10am.    [provider]  NON FORMULARY Moisturizing Cream to extremities with morning and evening care Every Shift    [provider]  NON FORMULARY Regular Special Instructions: Regular Diet, Thin Liquids    [provider]  omeprazole (PRILOSEC) 20 MG capsule Take 1 capsule (20 mg total) by mouth daily. 05/14/19   [name], [name], NP  timolol (BETIMOL) 0.5 % ophthalmic solution Place 1 drop into both eyes daily. 7am-10am.    [provider]  trimethoprim-polymyxin b (POLYTRIM) ophthalmic solution Place 1 drop into both eyes in the morning, at noon, in the evening, and at bedtime.    [provider]    Allergies    Azopt [brinzolamide], Brimonidine, and Lumigan [bimatoprost]  Review of Systems   Review of Systems  All other systems reviewed and are negative.   Physical Exam Updated Vital Signs BP 120/73 (BP Location: Right Arm)   Pulse 78   Temp (!) 97 F (36.1 C) (Oral)   Resp 16   SpO2 99%   Physical Exam Vitals reviewed.  Constitutional:      Appearance: Normal appearance.  HENT:     Head: Normocephalic.     Right Ear: External ear normal.     Left Ear: External ear normal.     Nose: Nose normal.     Mouth/Throat:     Mouth: Mucous membranes are moist.  Eyes:     Pupils: Pupils are equal, round, and  reactive to light.  Cardiovascular:     Rate and Rhythm: Normal rate and regular rhythm.     Pulses: Normal pulses.     Heart sounds: Normal heart sounds.  Pulmonary:     Effort: Pulmonary effort is normal.     Breath sounds: Normal breath sounds.  Abdominal:     General: Abdomen is flat.     Palpations: Abdomen is soft.  Musculoskeletal:        General: Normal range of motion.     Cervical back: Normal range of motion.     Comments: Mild contusion lue and rle  Skin:    General: Skin is warm and dry.     Capillary Refill: Capillary refill takes less than 2 seconds.  Neurological:     General: No focal deficit present.     Mental Status: She is alert and oriented to person, place, and time.     Cranial Nerves: No cranial nerve deficit.     Motor: No weakness.  Psychiatric:        Mood and Affect: Mood normal.        Behavior: Behavior normal.     ED Results / Procedures / Treatments   Labs (all labs ordered are listed, but only abnormal results are displayed) Labs Reviewed  I-STAT CHEM 8, ED - Abnormal; Notable for the following components:      Result Value   Potassium 3.3 (*)    Glucose, Bld 114 (*)    Calcium, Ion 1.00 (*)    All other components within normal limits    EKG EKG Interpretation  Date/Time:  Saturday May 30 2019 11:27:03 EST Ventricular Rate:  83 PR Interval:    QRS Duration: 79 QT Interval:  378 QTC Calculation: 439 R Axis:   -30 Text Interpretation: Sinus rhythm Ventricular premature complex Prolonged PR interval Left axis deviation Premature ventricular complexes Confirmed by Pattricia Boss [phone]) on 05/30/2019 11:34:55 AM   Radiology No results found.  Procedures Procedures (including critical care time)  Medications Ordered in ED Medications - No data to display  ED Course  I have reviewed the triage vital signs and the nursing notes.  Pertinent labs & imaging results that were available during my care of the patient were  reviewed by me and considered in my medical decision making (see chart for details).    MDM Rules/Calculators/A&P                      Patietn with svt treated prehospital.  EKG here with prolonged pr, nsr, pvc ow normal. Labs normal. VSS Patient to be discharged back to facility Discussed with patient and with her nephew Liliane Channel Final Clinical Impression(s) / ED Diagnoses Final diagnoses:  SVT (supraventricular  tachycardia) [facility])    Rx / DC Orders ED Discharge Orders    None       Pattricia Boss, MD 05/30/19 1222    Pattricia Boss, MD 05/30/19 [phone]

## 2019-06-01 DIAGNOSIS — M6281 Muscle weakness (generalized): Secondary | ICD-10-CM | POA: Diagnosis not present

## 2019-06-01 DIAGNOSIS — R41841 Cognitive communication deficit: Secondary | ICD-10-CM | POA: Diagnosis not present

## 2019-06-01 DIAGNOSIS — N3946 Mixed incontinence: Secondary | ICD-10-CM | POA: Diagnosis not present

## 2019-06-01 DIAGNOSIS — R29898 Other symptoms and signs involving the musculoskeletal system: Secondary | ICD-10-CM | POA: Diagnosis not present

## 2019-06-01 DIAGNOSIS — R1313 Dysphagia, pharyngeal phase: Secondary | ICD-10-CM | POA: Diagnosis not present

## 2019-06-01 DIAGNOSIS — R2681 Unsteadiness on feet: Secondary | ICD-10-CM | POA: Diagnosis not present

## 2019-06-02 ENCOUNTER — Encounter: Payer: Self-pay | Admitting: Internal Medicine

## 2019-06-02 ENCOUNTER — Non-Acute Institutional Stay: Payer: Medicare Other | Admitting: Internal Medicine

## 2019-06-02 DIAGNOSIS — W19XXXS Unspecified fall, sequela: Secondary | ICD-10-CM | POA: Diagnosis not present

## 2019-06-02 DIAGNOSIS — K529 Noninfective gastroenteritis and colitis, unspecified: Secondary | ICD-10-CM | POA: Diagnosis not present

## 2019-06-02 DIAGNOSIS — I471 Supraventricular tachycardia: Secondary | ICD-10-CM

## 2019-06-02 DIAGNOSIS — K219 Gastro-esophageal reflux disease without esophagitis: Secondary | ICD-10-CM | POA: Diagnosis not present

## 2019-06-02 NOTE — Progress Notes (Signed)
Location:   [facility] Room Number: 250 Place of Service:  ALF [id number]) Provider:  Veleta Miners MD  [name], [name], NP  Patient Care Team: [name], [name], NP as PCP - General (Internal Medicine)  Extended Emergency Contact Information Primary Emergency Contact: Luger,TROY Address: [place],  [place] Home Phone: [phone] Relation: None Secondary Emergency Contact: [name] Mobile Phone: [phone] Relation: Friend  Code Status:  DNR Goals of care: Advanced Directive information Advanced Directives 05/30/2019  Does Patient Have a Medical Advance Directive? Yes  Type of Paramedic of [place];Out of facility DNR (pink MOST or yellow form)  Does patient want to make changes to medical advance directive? -  Copy of [place] in Chart? Yes - validated most recent copy scanned in chart (See row information)  Pre-existing out of facility DNR order (yellow form or pink MOST form) -     Chief Complaint  Patient presents with  . Readmit To SNF    Readmit to [place]    HPI:  [name] seen today for an acute visit for Readmit to [facility]  Patient was admitted in [facility] after she was diagnosed with COvid in 04/21/2019 She stayed in the Acute unit for 2 weeks then was transferred to [facility] unit She eventually was discharged to her Apartment But per her and Apartment nurse she has been falling in her [place]. She seems to be forgetting her Walker. Also had issues with Palpitations Diagnosed with PSVT usually responds to Hydration After this visit to ED it was decided that she needs to stay in AL She denies any Palpitations or Dizziness. No Cough or SOB. Just feels anxious about walking  She is independet in her ADLS and IADLS Does not have children.  Past Medical History:  Diagnosis Date  . COVID-19   . 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 No pertinent surgical history.  Allergies  Allergen Reactions  . Azopt [Brinzolamide]   . Brimonidine   . Lumigan [Bimatoprost]     Allergies as of 06/02/2019      Reactions   Azopt [brinzolamide]    Brimonidine    Lumigan [bimatoprost]       Medication List       Accurate as of June 02, 2019 10:30 AM. If you have any questions, ask your nurse or doctor.        STOP taking these medications   diphenoxylate-atropine 2.5-0.025 MG tablet Commonly known as: LOMOTIL Stopped by: Virgie Dad, MD     TAKE these medications   aspirin EC 81 MG tablet Take 81 mg by mouth as needed. 7am-10am.   aspirin 325 MG EC tablet Take 325 mg by mouth as needed for pain.   b complex vitamins tablet Take 1 tablet by mouth as needed.   Calcium Carbonate-Vitamin D 600-400 MG-UNIT tablet Take 1 tablet by mouth daily.   CENTRUM SILVER PO Take 1 tablet by mouth daily. 7am-10am.   NON FORMULARY Moisturizing Cream to extremities with morning and evening care Every Shift   NON FORMULARY Regular Special Instructions: Regular Diet, Thin Liquids   omeprazole 20 MG capsule Commonly known as: PRILOSEC Take 1 capsule (20 mg total) by mouth daily.   timolol 0.5 % ophthalmic solution Commonly known as: BETIMOL Place 1 drop into both eyes  daily. 7am-10am.   trimethoprim-polymyxin b ophthalmic solution Commonly known as: POLYTRIM Place 1 drop into both eyes in the morning, at noon, in the evening, and at bedtime.       Review of Systems  Review of Systems  Constitutional: Negative for activity change, appetite change, chills, diaphoresis, fatigue and fever.  HENT: Negative for mouth sores, postnasal drip, rhinorrhea, sinus pain and sore throat.   Respiratory: Negative for apnea, cough, chest tightness, shortness of breath and wheezing.   Cardiovascular: Negative for chest pain, palpitations and leg swelling.  Gastrointestinal:  Negative for abdominal distention, abdominal pain, constipation, diarrhea, nausea and vomiting.  Genitourinary: Negative for dysuria and frequency.  Musculoskeletal: Negative for arthralgias, joint swelling and myalgias.  Skin: Negative for rash.  Neurological: Negative for dizziness, syncope, weakness, light-headedness and numbness.  Psychiatric/Behavioral: Negative for behavioral problems, confusion and sleep disturbance.     Immunization History  Administered Date(s) Administered  . Influenza, High Dose Seasonal PF 11/08/2018  . Influenza-Unspecified 11/07/2013   Pertinent  Health Maintenance Due  Topic Date Due  . PNA vac Low Risk Adult (1 of 2 - PCV13) 07/10/1989  . INFLUENZA VACCINE  Completed  . DEXA SCAN  Completed   Fall Risk  05/19/2019 05/14/2019  Falls in the past year? 0 0  Number falls in past yr: 0 0  Injury with Fall? 0 -   Functional Status Survey:    Vitals:   06/02/19 1025  BP: 102/60  Pulse: 64  Resp: 20  Temp: 98.4 F (36.9 C)  SpO2: 97%  Weight: 87 lb 11.2 oz (39.8 kg)  Height: 5' (1.524 m)   Body mass index is 17.13 kg/m. Physical Exam Constitutional: Oriented to person, place, and time. Very Frail  HENT:  Head: Normocephalic.  Mouth/Throat: Oropharynx is clear and moist.  Eyes: Pupils are equal, round, and reactive to light.  Neck: Neck supple.  Cardiovascular: Normal rate and normal heart sounds. Mildily Tachycardic No murmur heard. Pulmonary/Chest: Effort normal and breath sounds normal. No respiratory distress. No wheezes. She has no rales.  Abdominal: Soft. Bowel sounds are normal. No distension. There is no tenderness. There is no rebound.  Musculoskeletal: No edema.  Lymphadenopathy: none Neurological: Alert and oriented to person, place, and time.  Skin: Skin is warm and dry.  Psychiatric: Normal mood and affect. Behavior is normal. Thought content normal.   Labs reviewed: Recent Labs    04/20/19 1122 04/20/19 1122  04/23/19 0000 04/26/19 0414 05/30/19 1158  NA 143  --  142 141 142  K 4.0   < > 4.1 4.1 3.3*  CL 105   < > 108 106 109  CO2 27  --  27* 26  --   GLUCOSE 91  --   --  109* 114*  BUN 15  --  19 12 18   CREATININE 0.96  --  0.7 0.80 0.60  CALCIUM 8.0*  --  8.3* 7.8*  --    < > = values in this interval not displayed.   Recent Labs    04/23/19 0000 04/26/19 0414  AST 21 33  ALT 15 23  ALKPHOS 67 59  BILITOT  --  0.6  PROT  --  4.5*  ALBUMIN 3.1* 2.6*   Recent Labs    04/20/19 1122 04/20/19 1122 04/23/19 0000 04/26/19 0414 05/30/19 1158  WBC 8.1  --  3.8 5.5  --   NEUTROABS 7.1  --  2,569 4.1  --   HGB 14.0   < >  13.4 15.3* 14.3  HCT 43.5   < > 39 47.5* 42.0  MCV 98.2  --   --  99.8  --   PLT 322  --  281 226  --    < > = values in this interval not displayed.   No results found for: TSH No results found for: HGBA1C No results found for: CHOL, HDL, LDLCALC, LDLDIRECT, TRIG, CHOLHDL  Significant Diagnostic Results in last 30 days:  DG Chest Port 1 View  Result Date: 05/30/2019 CLINICAL DATA:  Multiple falls over the past 2 weeks. EXAM: PORTABLE CHEST 1 VIEW COMPARISON:  04/20/2019 FINDINGS: Lungs are adequately inflated and otherwise clear. Cardiomediastinal silhouette is within normal. No acute fracture. IMPRESSION: No acute findings. Electronically Signed   By: Marin Olp M.D.   On: 05/30/2019 12:16    Assessment/Plan  PSVT Usually responds to hydration in ED Will start her in low dose of Lopressor to see if it helps other wise ? Cardiology Consult for Ablation H/O Covid Pneumonia Has recovered completely Stays frail H/O Chronic Diarrhea Lomotil PRN Weakness and Falls To be evaluated by Therapy and treated by therapy GERD Continue Prilosec  Family/ staff Communication:   Labs/tests ordered:  CMP,CBC in 1 week  Total time spent in this patient care encounter was  45_  minutes; greater than 50% of the visit spent counseling patient and staff, reviewing  records , Labs and coordinating care for problems addressed at this encounter.

## 2019-06-03 DIAGNOSIS — R296 Repeated falls: Secondary | ICD-10-CM | POA: Diagnosis not present

## 2019-06-03 DIAGNOSIS — W010XXD Fall on same level from slipping, tripping and stumbling without subsequent striking against object, subsequent encounter: Secondary | ICD-10-CM | POA: Diagnosis not present

## 2019-06-03 DIAGNOSIS — R29818 Other symptoms and signs involving the nervous system: Secondary | ICD-10-CM | POA: Diagnosis not present

## 2019-06-03 DIAGNOSIS — R2681 Unsteadiness on feet: Secondary | ICD-10-CM | POA: Diagnosis not present

## 2019-06-03 DIAGNOSIS — R55 Syncope and collapse: Secondary | ICD-10-CM | POA: Diagnosis not present

## 2019-06-03 DIAGNOSIS — M6281 Muscle weakness (generalized): Secondary | ICD-10-CM | POA: Diagnosis not present

## 2019-06-03 DIAGNOSIS — N3946 Mixed incontinence: Secondary | ICD-10-CM | POA: Diagnosis not present

## 2019-06-03 DIAGNOSIS — N951 Menopausal and female climacteric states: Secondary | ICD-10-CM | POA: Diagnosis not present

## 2019-06-03 DIAGNOSIS — H25013 Cortical age-related cataract, bilateral: Secondary | ICD-10-CM | POA: Diagnosis not present

## 2019-06-03 DIAGNOSIS — H353220 Exudative age-related macular degeneration, left eye, stage unspecified: Secondary | ICD-10-CM | POA: Diagnosis not present

## 2019-06-03 DIAGNOSIS — B029 Zoster without complications: Secondary | ICD-10-CM | POA: Diagnosis not present

## 2019-06-03 DIAGNOSIS — H40009 Preglaucoma, unspecified, unspecified eye: Secondary | ICD-10-CM | POA: Diagnosis not present

## 2019-06-03 DIAGNOSIS — Z7389 Other problems related to life management difficulty: Secondary | ICD-10-CM | POA: Diagnosis not present

## 2019-06-03 DIAGNOSIS — R32 Unspecified urinary incontinence: Secondary | ICD-10-CM | POA: Diagnosis not present

## 2019-06-04 DIAGNOSIS — R2681 Unsteadiness on feet: Secondary | ICD-10-CM | POA: Diagnosis not present

## 2019-06-04 DIAGNOSIS — I10 Essential (primary) hypertension: Secondary | ICD-10-CM | POA: Diagnosis not present

## 2019-06-04 DIAGNOSIS — R296 Repeated falls: Secondary | ICD-10-CM | POA: Diagnosis not present

## 2019-06-04 DIAGNOSIS — Z7389 Other problems related to life management difficulty: Secondary | ICD-10-CM | POA: Diagnosis not present

## 2019-06-04 DIAGNOSIS — N3946 Mixed incontinence: Secondary | ICD-10-CM | POA: Diagnosis not present

## 2019-06-04 DIAGNOSIS — M6281 Muscle weakness (generalized): Secondary | ICD-10-CM | POA: Diagnosis not present

## 2019-06-04 DIAGNOSIS — H353220 Exudative age-related macular degeneration, left eye, stage unspecified: Secondary | ICD-10-CM | POA: Diagnosis not present

## 2019-06-05 DIAGNOSIS — N3946 Mixed incontinence: Secondary | ICD-10-CM | POA: Diagnosis not present

## 2019-06-05 DIAGNOSIS — R296 Repeated falls: Secondary | ICD-10-CM | POA: Diagnosis not present

## 2019-06-05 DIAGNOSIS — R2681 Unsteadiness on feet: Secondary | ICD-10-CM | POA: Diagnosis not present

## 2019-06-05 DIAGNOSIS — M6281 Muscle weakness (generalized): Secondary | ICD-10-CM | POA: Diagnosis not present

## 2019-06-05 DIAGNOSIS — H353220 Exudative age-related macular degeneration, left eye, stage unspecified: Secondary | ICD-10-CM | POA: Diagnosis not present

## 2019-06-05 DIAGNOSIS — Z7389 Other problems related to life management difficulty: Secondary | ICD-10-CM | POA: Diagnosis not present

## 2019-06-08 DIAGNOSIS — H40009 Preglaucoma, unspecified, unspecified eye: Secondary | ICD-10-CM | POA: Diagnosis not present

## 2019-06-08 DIAGNOSIS — H353220 Exudative age-related macular degeneration, left eye, stage unspecified: Secondary | ICD-10-CM | POA: Diagnosis not present

## 2019-06-08 DIAGNOSIS — N3946 Mixed incontinence: Secondary | ICD-10-CM | POA: Diagnosis not present

## 2019-06-08 DIAGNOSIS — B029 Zoster without complications: Secondary | ICD-10-CM | POA: Diagnosis not present

## 2019-06-08 DIAGNOSIS — M6281 Muscle weakness (generalized): Secondary | ICD-10-CM | POA: Diagnosis not present

## 2019-06-08 DIAGNOSIS — H25013 Cortical age-related cataract, bilateral: Secondary | ICD-10-CM | POA: Diagnosis not present

## 2019-06-08 DIAGNOSIS — N951 Menopausal and female climacteric states: Secondary | ICD-10-CM | POA: Diagnosis not present

## 2019-06-08 DIAGNOSIS — R269 Unspecified abnormalities of gait and mobility: Secondary | ICD-10-CM | POA: Diagnosis not present

## 2019-06-08 DIAGNOSIS — W010XXD Fall on same level from slipping, tripping and stumbling without subsequent striking against object, subsequent encounter: Secondary | ICD-10-CM | POA: Diagnosis not present

## 2019-06-08 DIAGNOSIS — R2681 Unsteadiness on feet: Secondary | ICD-10-CM | POA: Diagnosis not present

## 2019-06-08 DIAGNOSIS — R1314 Dysphagia, pharyngoesophageal phase: Secondary | ICD-10-CM | POA: Diagnosis not present

## 2019-06-08 DIAGNOSIS — R41841 Cognitive communication deficit: Secondary | ICD-10-CM | POA: Diagnosis not present

## 2019-06-08 DIAGNOSIS — R296 Repeated falls: Secondary | ICD-10-CM | POA: Diagnosis not present

## 2019-06-08 DIAGNOSIS — Z7389 Other problems related to life management difficulty: Secondary | ICD-10-CM | POA: Diagnosis not present

## 2019-06-09 DIAGNOSIS — R269 Unspecified abnormalities of gait and mobility: Secondary | ICD-10-CM | POA: Diagnosis not present

## 2019-06-09 DIAGNOSIS — R1314 Dysphagia, pharyngoesophageal phase: Secondary | ICD-10-CM | POA: Diagnosis not present

## 2019-06-09 DIAGNOSIS — R2681 Unsteadiness on feet: Secondary | ICD-10-CM | POA: Diagnosis not present

## 2019-06-09 DIAGNOSIS — N3946 Mixed incontinence: Secondary | ICD-10-CM | POA: Diagnosis not present

## 2019-06-09 DIAGNOSIS — R41841 Cognitive communication deficit: Secondary | ICD-10-CM | POA: Diagnosis not present

## 2019-06-09 DIAGNOSIS — M6281 Muscle weakness (generalized): Secondary | ICD-10-CM | POA: Diagnosis not present

## 2019-06-10 DIAGNOSIS — R41841 Cognitive communication deficit: Secondary | ICD-10-CM | POA: Diagnosis not present

## 2019-06-10 DIAGNOSIS — R1314 Dysphagia, pharyngoesophageal phase: Secondary | ICD-10-CM | POA: Diagnosis not present

## 2019-06-10 DIAGNOSIS — M6281 Muscle weakness (generalized): Secondary | ICD-10-CM | POA: Diagnosis not present

## 2019-06-10 DIAGNOSIS — R2681 Unsteadiness on feet: Secondary | ICD-10-CM | POA: Diagnosis not present

## 2019-06-10 DIAGNOSIS — R269 Unspecified abnormalities of gait and mobility: Secondary | ICD-10-CM | POA: Diagnosis not present

## 2019-06-10 DIAGNOSIS — N3946 Mixed incontinence: Secondary | ICD-10-CM | POA: Diagnosis not present

## 2019-06-11 ENCOUNTER — Encounter: Payer: Self-pay | Admitting: Nurse Practitioner

## 2019-06-11 ENCOUNTER — Non-Acute Institutional Stay: Payer: Medicare Other | Admitting: Nurse Practitioner

## 2019-06-11 ENCOUNTER — Other Ambulatory Visit: Payer: Self-pay

## 2019-06-11 DIAGNOSIS — R269 Unspecified abnormalities of gait and mobility: Secondary | ICD-10-CM | POA: Diagnosis not present

## 2019-06-11 DIAGNOSIS — R609 Edema, unspecified: Secondary | ICD-10-CM | POA: Diagnosis not present

## 2019-06-11 DIAGNOSIS — M7741 Metatarsalgia, right foot: Secondary | ICD-10-CM

## 2019-06-11 DIAGNOSIS — K219 Gastro-esophageal reflux disease without esophagitis: Secondary | ICD-10-CM

## 2019-06-11 DIAGNOSIS — I471 Supraventricular tachycardia: Secondary | ICD-10-CM | POA: Diagnosis not present

## 2019-06-11 DIAGNOSIS — N3946 Mixed incontinence: Secondary | ICD-10-CM | POA: Diagnosis not present

## 2019-06-11 DIAGNOSIS — S81811D Laceration without foreign body, right lower leg, subsequent encounter: Secondary | ICD-10-CM | POA: Diagnosis not present

## 2019-06-11 DIAGNOSIS — R41841 Cognitive communication deficit: Secondary | ICD-10-CM | POA: Diagnosis not present

## 2019-06-11 DIAGNOSIS — R2681 Unsteadiness on feet: Secondary | ICD-10-CM | POA: Diagnosis not present

## 2019-06-11 DIAGNOSIS — M6281 Muscle weakness (generalized): Secondary | ICD-10-CM | POA: Diagnosis not present

## 2019-06-11 DIAGNOSIS — M7740 Metatarsalgia, unspecified foot: Secondary | ICD-10-CM | POA: Insufficient documentation

## 2019-06-11 DIAGNOSIS — R1314 Dysphagia, pharyngoesophageal phase: Secondary | ICD-10-CM | POA: Diagnosis not present

## 2019-06-11 NOTE — Progress Notes (Addendum)
Location:   [facility] Room Number: 229 Place of Service:  ALF (13)clinic  Provider: Marlana Latus NP  Code Status: DNR Goals of Care: [place] Advanced Directives 05/30/2019  Does Patient Have a Medical Advance Directive? Yes  Type of Paramedic of [place];Out of facility DNR (pink MOST or yellow form)  Does patient want to make changes to medical advance directive? -  Copy of [place] in Chart? Yes - validated most recent copy scanned in chart (See row information)  Pre-existing out of facility DNR order (yellow form or pink MOST form) -     Chief Complaint  Patient presents with  . Medical Management of Chronic Issues    HPI: Patient is a 86 y.o. female seen today for medical management of chronic diseases.   The patient resides in [facility] for higher level of care, skin tear on R lower leg, healing, but small amount of swelling present.  SVT,  heart rate is not in control, on Metoprolol 12.'5mg'$  bid. Hx of GERD, stable, on Omeprazole '20mg'$  qd.   Reported worsened swelling in legs, R>L, the patient denied SOB, cough, chest pain/pressure, palpitation.    Past Medical History:  Diagnosis Date  . COVID-19   . 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 ([place])     History reviewed. No pertinent surgical history.  Allergies  Allergen Reactions  . Azopt [Brinzolamide]   . Brimonidine   . Lumigan [Bimatoprost]     Allergies as of 06/11/2019      Reactions   Azopt [brinzolamide]    Brimonidine    Lumigan [bimatoprost]       Medication List       Accurate as of June 11, 2019 11:59 PM. If you have any questions, ask your nurse or doctor.        aspirin EC 81 MG tablet Take 81 mg by mouth as needed. 7am-10am.   b complex vitamins tablet Take 1 tablet by mouth as needed.   Calcium Carbonate-Vitamin D 600-400 MG-UNIT tablet Take 1 tablet by mouth daily.   CENTRUM SILVER PO  Take 1 tablet by mouth daily. 7am-10am.   diphenoxylate-atropine 2.5-0.025 MG/5ML liquid Commonly known as: LOMOTIL Take by mouth 3 (three) times daily as needed for diarrhea or loose stools.   metoprolol tartrate 25 MG tablet Commonly known as: LOPRESSOR Take 25 mg by mouth 2 (two) times daily.   NON FORMULARY Moisturizing Cream to extremities with morning and evening care Every Shift   NON FORMULARY Regular Special Instructions: Regular Diet, Thin Liquids   omeprazole 20 MG capsule Commonly known as: PRILOSEC Take 1 capsule (20 mg total) by mouth daily.   timolol 0.5 % ophthalmic solution Commonly known as: BETIMOL Place 1 drop into both eyes daily. 7am-10am.       Review of Systems:  Review of Systems  Constitutional: Negative for activity change, appetite change, chills, diaphoresis, fatigue and fever.  HENT: Positive for hearing loss and trouble swallowing. Negative for congestion and voice change.   Eyes: Negative for visual disturbance.  Respiratory: Negative for cough, shortness of breath and wheezing.   Cardiovascular: Positive for leg swelling. Negative for chest pain and palpitations.  Gastrointestinal: Negative for abdominal distention, abdominal pain, constipation, diarrhea, nausea and vomiting.  Genitourinary: Positive for frequency. Negative for difficulty urinating, dysuria and urgency.  Musculoskeletal: Positive for gait problem.  Skin: Positive for wound.       Right  lowe leg.   Neurological: Negative for dizziness, speech difficulty, weakness and headaches.  Psychiatric/Behavioral: Negative for agitation, behavioral problems, hallucinations and sleep disturbance. The patient is not nervous/anxious.     Health Maintenance  Topic Date Due  . TETANUS/TDAP  07/11/1941  . PNA vac Low Risk Adult (1 of 2 - PCV13) 07/10/1989  . INFLUENZA VACCINE  Completed  . DEXA SCAN  Completed    Physical Exam: Vitals:   06/11/19 1442  BP: 104/62  Pulse: 66   Resp: 18  Temp: 98.5 F (36.9 C)  SpO2: 97%  Weight: 90 lb (40.8 kg)  Height: 4' 5" (1.346 m)   Body mass index is 22.53 kg/m. Physical Exam Vitals and nursing note reviewed.  Constitutional:      General: She is not in acute distress.    Appearance: Normal appearance. She is not ill-appearing, toxic-appearing or diaphoretic.  HENT:     Head: Normocephalic and atraumatic.     Nose: Nose normal.     Mouth/Throat:     Mouth: Mucous membranes are moist.  Eyes:     Extraocular Movements: Extraocular movements intact.     Conjunctiva/sclera: Conjunctivae normal.     Pupils: Pupils are equal, round, and reactive to light.  Cardiovascular:     Rate and Rhythm: Normal rate and regular rhythm.     Heart sounds: No murmur.  Pulmonary:     Breath sounds: No wheezing, rhonchi or rales.  Abdominal:     General: Bowel sounds are normal. There is no distension.     Palpations: Abdomen is soft.     Tenderness: There is no abdominal tenderness. There is no right CVA tenderness, left CVA tenderness, guarding or rebound.  Musculoskeletal:     Cervical back: Normal range of motion and neck supple.     Right lower leg: Edema present.     Left lower leg: No edema.     Comments: Trace edema RLE  Skin:    General: Skin is warm and dry.     Findings: Erythema present.     Comments: Anterior right lower leg skin tear is approximated with steri strips, mild erythematous peri wound, no s/s of infection.   Neurological:     General: No focal deficit present.     Mental Status: She is alert and oriented to person, place, and time. Mental status is at baseline.     Motor: No weakness.     Coordination: Coordination normal.     Gait: Gait abnormal.  Psychiatric:        Mood and Affect: Mood normal.        Behavior: Behavior normal.        Thought Content: Thought content normal.     Labs reviewed: Basic Metabolic Panel: Recent Labs    04/20/19 1122 04/20/19 1122 04/23/19 0000 04/26/19  0414 05/30/19 1158  NA 143  --  142 141 142  K 4.0   < > 4.1 4.1 3.3*  CL 105   < > 108 106 109  CO2 27  --  27* 26  --   GLUCOSE 91  --   --  109* 114*  BUN 15  --  _0 CREATININE 0.96  --  0.7 0.80 0.60  CALCIUM 8.0*  --  8.3* 7.8*  --    < > = values in this interval not displayed.   Liver Function Tests: Recent Labs    04/23/19 0000 04/26/19 0414  AST 21  33  ALT 15 23  ALKPHOS 67 59  BILITOT  --  0.6  PROT  --  4.5*  ALBUMIN 3.1* 2.6*   No results for input(s): LIPASE, AMYLASE in the last 8760 hours. No results for input(s): AMMONIA in the last 8760 hours. CBC: Recent Labs    04/20/19 1122 04/20/19 1122 04/23/19 0000 04/26/19 0414 05/30/19 1158  WBC 8.1  --  3.8 5.5  --   NEUTROABS 7.1  --  2,569 4.1  --   HGB 14.0   < > 13.4 15.3* 14.3  HCT 43.5   < > 39 47.5* 42.0  MCV 98.2  --   --  99.8  --   PLT 322  --  281 226  --    < > = values in this interval not displayed.   Lipid Panel: No results for input(s): CHOL, HDL, LDLCALC, TRIG, CHOLHDL, LDLDIRECT in the last 8760 hours. No results found for: HGBA1C  Procedures since last visit: DG Chest Port 1 View  Result Date: 05/30/2019 CLINICAL DATA:  Multiple falls over the past 2 weeks. EXAM: PORTABLE CHEST 1 VIEW COMPARISON:  04/20/2019 FINDINGS: Lungs are adequately inflated and otherwise clear. Cardiomediastinal silhouette is within normal. No acute fracture. IMPRESSION: No acute findings. Electronically Signed   By: Marin Olp M.D.   On: 05/30/2019 12:16    Assessment/Plan  GERD (gastroesophageal reflux disease) Stable, continue Omeprazole.   Skin tear of right lower leg without complication Slow healing.   Paroxysmal SVT (supraventricular tachycardia) (HCC) HR 130-140 bpm, she denied dizziness, SOB, chest pain/pressure, palpitation, or generalized weakness, increase  Metoprolol 23m bid. EKG. VS q shift. Echocardiogram.   Edema BLE, RLE 1+, LLE trace, new, developing CHF in setting of rapid  heart beats or peripheral venous insufficiency, will Torsemide 133mKcl 1071mqd in setting of slow healing traumatic wound in the right shin, no s/s of infection. CBC/diff, CMP/eGFR next lab  Metatarsalgia The right, uses OTC gel   Labs/tests ordered: CBC/diff, CMP/eGFR, EKG, Echocardiogram.   Plan of care reviewed with the patient and charge nurse  Time spend 40 minutes.

## 2019-06-11 NOTE — Assessment & Plan Note (Addendum)
HR 130-140 bpm, she denied dizziness, SOB, chest pain/pressure, palpitation, or generalized weakness, increase  Metoprolol 25mg  bid. EKG. VS q shift. Echocardiogram.

## 2019-06-11 NOTE — Assessment & Plan Note (Signed)
BLE, RLE 1+, LLE trace, new, developing CHF in setting of rapid heart beats or peripheral venous insufficiency, will Torsemide 14m Kcl 143m qd in setting of slow healing traumatic wound in the right shin, no s/s of infection. CBC/diff, CMP/eGFR next lab

## 2019-06-11 NOTE — Assessment & Plan Note (Signed)
Slow healing.

## 2019-06-11 NOTE — Assessment & Plan Note (Signed)
The right, uses OTC gel

## 2019-06-11 NOTE — Assessment & Plan Note (Signed)
Stable, continue Omeprazole.  

## 2019-06-11 NOTE — Patient Instructions (Signed)
F/u Dr. Lyndel Safe in 3 months.

## 2019-06-12 ENCOUNTER — Encounter (INDEPENDENT_AMBULATORY_CARE_PROVIDER_SITE_OTHER): Payer: Medicare Other | Admitting: Ophthalmology

## 2019-06-12 ENCOUNTER — Encounter: Payer: Self-pay | Admitting: Nurse Practitioner

## 2019-06-12 DIAGNOSIS — N3946 Mixed incontinence: Secondary | ICD-10-CM | POA: Diagnosis not present

## 2019-06-12 DIAGNOSIS — R41841 Cognitive communication deficit: Secondary | ICD-10-CM | POA: Diagnosis not present

## 2019-06-12 DIAGNOSIS — R269 Unspecified abnormalities of gait and mobility: Secondary | ICD-10-CM | POA: Diagnosis not present

## 2019-06-12 DIAGNOSIS — M6281 Muscle weakness (generalized): Secondary | ICD-10-CM | POA: Diagnosis not present

## 2019-06-12 DIAGNOSIS — R1314 Dysphagia, pharyngoesophageal phase: Secondary | ICD-10-CM | POA: Diagnosis not present

## 2019-06-12 DIAGNOSIS — R2681 Unsteadiness on feet: Secondary | ICD-10-CM | POA: Diagnosis not present

## 2019-06-12 DIAGNOSIS — I10 Essential (primary) hypertension: Secondary | ICD-10-CM | POA: Diagnosis not present

## 2019-06-12 NOTE — Addendum Note (Signed)
Addended by: Adriana Quinby X on: 06/12/2019 11:48 AM   Modules accepted: Level of Service

## 2019-06-14 DIAGNOSIS — R269 Unspecified abnormalities of gait and mobility: Secondary | ICD-10-CM | POA: Diagnosis not present

## 2019-06-14 DIAGNOSIS — M6281 Muscle weakness (generalized): Secondary | ICD-10-CM | POA: Diagnosis not present

## 2019-06-14 DIAGNOSIS — R1314 Dysphagia, pharyngoesophageal phase: Secondary | ICD-10-CM | POA: Diagnosis not present

## 2019-06-14 DIAGNOSIS — R41841 Cognitive communication deficit: Secondary | ICD-10-CM | POA: Diagnosis not present

## 2019-06-14 DIAGNOSIS — N3946 Mixed incontinence: Secondary | ICD-10-CM | POA: Diagnosis not present

## 2019-06-14 DIAGNOSIS — R2681 Unsteadiness on feet: Secondary | ICD-10-CM | POA: Diagnosis not present

## 2019-06-15 ENCOUNTER — Encounter (INDEPENDENT_AMBULATORY_CARE_PROVIDER_SITE_OTHER): Payer: Medicare Other | Admitting: Ophthalmology

## 2019-06-15 DIAGNOSIS — N3946 Mixed incontinence: Secondary | ICD-10-CM | POA: Diagnosis not present

## 2019-06-15 DIAGNOSIS — H43813 Vitreous degeneration, bilateral: Secondary | ICD-10-CM | POA: Diagnosis not present

## 2019-06-15 DIAGNOSIS — H353231 Exudative age-related macular degeneration, bilateral, with active choroidal neovascularization: Secondary | ICD-10-CM

## 2019-06-15 DIAGNOSIS — R41841 Cognitive communication deficit: Secondary | ICD-10-CM | POA: Diagnosis not present

## 2019-06-15 DIAGNOSIS — R2681 Unsteadiness on feet: Secondary | ICD-10-CM | POA: Diagnosis not present

## 2019-06-15 DIAGNOSIS — M6281 Muscle weakness (generalized): Secondary | ICD-10-CM | POA: Diagnosis not present

## 2019-06-15 DIAGNOSIS — R269 Unspecified abnormalities of gait and mobility: Secondary | ICD-10-CM | POA: Diagnosis not present

## 2019-06-15 DIAGNOSIS — R1314 Dysphagia, pharyngoesophageal phase: Secondary | ICD-10-CM | POA: Diagnosis not present

## 2019-06-16 DIAGNOSIS — R2681 Unsteadiness on feet: Secondary | ICD-10-CM | POA: Diagnosis not present

## 2019-06-16 DIAGNOSIS — R41841 Cognitive communication deficit: Secondary | ICD-10-CM | POA: Diagnosis not present

## 2019-06-16 DIAGNOSIS — R1314 Dysphagia, pharyngoesophageal phase: Secondary | ICD-10-CM | POA: Diagnosis not present

## 2019-06-16 DIAGNOSIS — I10 Essential (primary) hypertension: Secondary | ICD-10-CM | POA: Diagnosis not present

## 2019-06-16 DIAGNOSIS — M6281 Muscle weakness (generalized): Secondary | ICD-10-CM | POA: Diagnosis not present

## 2019-06-16 DIAGNOSIS — N3946 Mixed incontinence: Secondary | ICD-10-CM | POA: Diagnosis not present

## 2019-06-16 DIAGNOSIS — R269 Unspecified abnormalities of gait and mobility: Secondary | ICD-10-CM | POA: Diagnosis not present

## 2019-06-16 LAB — CBC AND DIFFERENTIAL
HCT: 39 (ref 36–46)
Hemoglobin: 12.7 (ref 12.0–16.0)
Neutrophils Absolute: 3892
Platelets: 257 (ref 150–399)
WBC: 6.1

## 2019-06-16 LAB — COMPREHENSIVE METABOLIC PANEL
Albumin: 3.6 (ref 3.5–5.0)
Calcium: 8.7 (ref 8.7–10.7)
Globulin: 2.4

## 2019-06-16 LAB — BASIC METABOLIC PANEL
BUN: 13 (ref 4–21)
CO2: 29 — AB (ref 13–22)
Chloride: 103 (ref 99–108)
Creatinine: 0.7 (ref 0.5–1.1)
Glucose: 92
Potassium: 4.2 (ref 3.4–5.3)
Sodium: 142 (ref 137–147)

## 2019-06-16 LAB — CBC: RBC: 3.97 (ref 3.87–5.11)

## 2019-06-16 LAB — HEPATIC FUNCTION PANEL
ALT: 25 (ref 7–35)
AST: 24 (ref 13–35)
Alkaline Phosphatase: 89 (ref 25–125)
Bilirubin, Total: 0.6

## 2019-06-17 DIAGNOSIS — R269 Unspecified abnormalities of gait and mobility: Secondary | ICD-10-CM | POA: Diagnosis not present

## 2019-06-17 DIAGNOSIS — M6281 Muscle weakness (generalized): Secondary | ICD-10-CM | POA: Diagnosis not present

## 2019-06-17 DIAGNOSIS — R41841 Cognitive communication deficit: Secondary | ICD-10-CM | POA: Diagnosis not present

## 2019-06-17 DIAGNOSIS — R2681 Unsteadiness on feet: Secondary | ICD-10-CM | POA: Diagnosis not present

## 2019-06-17 DIAGNOSIS — N3946 Mixed incontinence: Secondary | ICD-10-CM | POA: Diagnosis not present

## 2019-06-17 DIAGNOSIS — R1314 Dysphagia, pharyngoesophageal phase: Secondary | ICD-10-CM | POA: Diagnosis not present

## 2019-06-18 DIAGNOSIS — R1314 Dysphagia, pharyngoesophageal phase: Secondary | ICD-10-CM | POA: Diagnosis not present

## 2019-06-18 DIAGNOSIS — R269 Unspecified abnormalities of gait and mobility: Secondary | ICD-10-CM | POA: Diagnosis not present

## 2019-06-18 DIAGNOSIS — N3946 Mixed incontinence: Secondary | ICD-10-CM | POA: Diagnosis not present

## 2019-06-18 DIAGNOSIS — R2681 Unsteadiness on feet: Secondary | ICD-10-CM | POA: Diagnosis not present

## 2019-06-18 DIAGNOSIS — R41841 Cognitive communication deficit: Secondary | ICD-10-CM | POA: Diagnosis not present

## 2019-06-18 DIAGNOSIS — M6281 Muscle weakness (generalized): Secondary | ICD-10-CM | POA: Diagnosis not present

## 2019-06-19 DIAGNOSIS — R1314 Dysphagia, pharyngoesophageal phase: Secondary | ICD-10-CM | POA: Diagnosis not present

## 2019-06-19 DIAGNOSIS — N3946 Mixed incontinence: Secondary | ICD-10-CM | POA: Diagnosis not present

## 2019-06-19 DIAGNOSIS — R41841 Cognitive communication deficit: Secondary | ICD-10-CM | POA: Diagnosis not present

## 2019-06-19 DIAGNOSIS — R2681 Unsteadiness on feet: Secondary | ICD-10-CM | POA: Diagnosis not present

## 2019-06-19 DIAGNOSIS — R269 Unspecified abnormalities of gait and mobility: Secondary | ICD-10-CM | POA: Diagnosis not present

## 2019-06-19 DIAGNOSIS — M6281 Muscle weakness (generalized): Secondary | ICD-10-CM | POA: Diagnosis not present

## 2019-06-22 DIAGNOSIS — R2681 Unsteadiness on feet: Secondary | ICD-10-CM | POA: Diagnosis not present

## 2019-06-22 DIAGNOSIS — R41841 Cognitive communication deficit: Secondary | ICD-10-CM | POA: Diagnosis not present

## 2019-06-22 DIAGNOSIS — R1314 Dysphagia, pharyngoesophageal phase: Secondary | ICD-10-CM | POA: Diagnosis not present

## 2019-06-22 DIAGNOSIS — R269 Unspecified abnormalities of gait and mobility: Secondary | ICD-10-CM | POA: Diagnosis not present

## 2019-06-22 DIAGNOSIS — N3946 Mixed incontinence: Secondary | ICD-10-CM | POA: Diagnosis not present

## 2019-06-22 DIAGNOSIS — M6281 Muscle weakness (generalized): Secondary | ICD-10-CM | POA: Diagnosis not present

## 2019-06-23 DIAGNOSIS — R269 Unspecified abnormalities of gait and mobility: Secondary | ICD-10-CM | POA: Diagnosis not present

## 2019-06-23 DIAGNOSIS — N3946 Mixed incontinence: Secondary | ICD-10-CM | POA: Diagnosis not present

## 2019-06-23 DIAGNOSIS — R41841 Cognitive communication deficit: Secondary | ICD-10-CM | POA: Diagnosis not present

## 2019-06-23 DIAGNOSIS — R2681 Unsteadiness on feet: Secondary | ICD-10-CM | POA: Diagnosis not present

## 2019-06-23 DIAGNOSIS — M6281 Muscle weakness (generalized): Secondary | ICD-10-CM | POA: Diagnosis not present

## 2019-06-23 DIAGNOSIS — R1314 Dysphagia, pharyngoesophageal phase: Secondary | ICD-10-CM | POA: Diagnosis not present

## 2019-06-24 DIAGNOSIS — R1314 Dysphagia, pharyngoesophageal phase: Secondary | ICD-10-CM | POA: Diagnosis not present

## 2019-06-24 DIAGNOSIS — R41841 Cognitive communication deficit: Secondary | ICD-10-CM | POA: Diagnosis not present

## 2019-06-24 DIAGNOSIS — R2681 Unsteadiness on feet: Secondary | ICD-10-CM | POA: Diagnosis not present

## 2019-06-24 DIAGNOSIS — R269 Unspecified abnormalities of gait and mobility: Secondary | ICD-10-CM | POA: Diagnosis not present

## 2019-06-24 DIAGNOSIS — M6281 Muscle weakness (generalized): Secondary | ICD-10-CM | POA: Diagnosis not present

## 2019-06-24 DIAGNOSIS — N3946 Mixed incontinence: Secondary | ICD-10-CM | POA: Diagnosis not present

## 2019-06-25 DIAGNOSIS — R269 Unspecified abnormalities of gait and mobility: Secondary | ICD-10-CM | POA: Diagnosis not present

## 2019-06-25 DIAGNOSIS — N3946 Mixed incontinence: Secondary | ICD-10-CM | POA: Diagnosis not present

## 2019-06-25 DIAGNOSIS — R41841 Cognitive communication deficit: Secondary | ICD-10-CM | POA: Diagnosis not present

## 2019-06-25 DIAGNOSIS — M6281 Muscle weakness (generalized): Secondary | ICD-10-CM | POA: Diagnosis not present

## 2019-06-25 DIAGNOSIS — R1314 Dysphagia, pharyngoesophageal phase: Secondary | ICD-10-CM | POA: Diagnosis not present

## 2019-06-25 DIAGNOSIS — R2681 Unsteadiness on feet: Secondary | ICD-10-CM | POA: Diagnosis not present

## 2019-06-29 DIAGNOSIS — R269 Unspecified abnormalities of gait and mobility: Secondary | ICD-10-CM | POA: Diagnosis not present

## 2019-06-29 DIAGNOSIS — R2681 Unsteadiness on feet: Secondary | ICD-10-CM | POA: Diagnosis not present

## 2019-06-29 DIAGNOSIS — N3946 Mixed incontinence: Secondary | ICD-10-CM | POA: Diagnosis not present

## 2019-06-29 DIAGNOSIS — R41841 Cognitive communication deficit: Secondary | ICD-10-CM | POA: Diagnosis not present

## 2019-06-29 DIAGNOSIS — R1314 Dysphagia, pharyngoesophageal phase: Secondary | ICD-10-CM | POA: Diagnosis not present

## 2019-06-29 DIAGNOSIS — M6281 Muscle weakness (generalized): Secondary | ICD-10-CM | POA: Diagnosis not present

## 2019-06-30 DIAGNOSIS — R2681 Unsteadiness on feet: Secondary | ICD-10-CM | POA: Diagnosis not present

## 2019-06-30 DIAGNOSIS — M6281 Muscle weakness (generalized): Secondary | ICD-10-CM | POA: Diagnosis not present

## 2019-06-30 DIAGNOSIS — N3946 Mixed incontinence: Secondary | ICD-10-CM | POA: Diagnosis not present

## 2019-06-30 DIAGNOSIS — R269 Unspecified abnormalities of gait and mobility: Secondary | ICD-10-CM | POA: Diagnosis not present

## 2019-06-30 DIAGNOSIS — R1314 Dysphagia, pharyngoesophageal phase: Secondary | ICD-10-CM | POA: Diagnosis not present

## 2019-06-30 DIAGNOSIS — R41841 Cognitive communication deficit: Secondary | ICD-10-CM | POA: Diagnosis not present

## 2019-07-01 DIAGNOSIS — R269 Unspecified abnormalities of gait and mobility: Secondary | ICD-10-CM | POA: Diagnosis not present

## 2019-07-01 DIAGNOSIS — R1314 Dysphagia, pharyngoesophageal phase: Secondary | ICD-10-CM | POA: Diagnosis not present

## 2019-07-01 DIAGNOSIS — R41841 Cognitive communication deficit: Secondary | ICD-10-CM | POA: Diagnosis not present

## 2019-07-01 DIAGNOSIS — M6281 Muscle weakness (generalized): Secondary | ICD-10-CM | POA: Diagnosis not present

## 2019-07-01 DIAGNOSIS — R2681 Unsteadiness on feet: Secondary | ICD-10-CM | POA: Diagnosis not present

## 2019-07-01 DIAGNOSIS — N3946 Mixed incontinence: Secondary | ICD-10-CM | POA: Diagnosis not present

## 2019-07-02 DIAGNOSIS — M6281 Muscle weakness (generalized): Secondary | ICD-10-CM | POA: Diagnosis not present

## 2019-07-02 DIAGNOSIS — R41841 Cognitive communication deficit: Secondary | ICD-10-CM | POA: Diagnosis not present

## 2019-07-02 DIAGNOSIS — N3946 Mixed incontinence: Secondary | ICD-10-CM | POA: Diagnosis not present

## 2019-07-02 DIAGNOSIS — R269 Unspecified abnormalities of gait and mobility: Secondary | ICD-10-CM | POA: Diagnosis not present

## 2019-07-02 DIAGNOSIS — R2681 Unsteadiness on feet: Secondary | ICD-10-CM | POA: Diagnosis not present

## 2019-07-02 DIAGNOSIS — R1314 Dysphagia, pharyngoesophageal phase: Secondary | ICD-10-CM | POA: Diagnosis not present

## 2019-07-02 DIAGNOSIS — R9431 Abnormal electrocardiogram [ECG] [EKG]: Secondary | ICD-10-CM | POA: Diagnosis not present

## 2019-07-03 DIAGNOSIS — N3946 Mixed incontinence: Secondary | ICD-10-CM | POA: Diagnosis not present

## 2019-07-03 DIAGNOSIS — M6281 Muscle weakness (generalized): Secondary | ICD-10-CM | POA: Diagnosis not present

## 2019-07-03 DIAGNOSIS — R1314 Dysphagia, pharyngoesophageal phase: Secondary | ICD-10-CM | POA: Diagnosis not present

## 2019-07-03 DIAGNOSIS — R269 Unspecified abnormalities of gait and mobility: Secondary | ICD-10-CM | POA: Diagnosis not present

## 2019-07-03 DIAGNOSIS — R2681 Unsteadiness on feet: Secondary | ICD-10-CM | POA: Diagnosis not present

## 2019-07-03 DIAGNOSIS — R41841 Cognitive communication deficit: Secondary | ICD-10-CM | POA: Diagnosis not present

## 2019-07-06 DIAGNOSIS — R41841 Cognitive communication deficit: Secondary | ICD-10-CM | POA: Diagnosis not present

## 2019-07-06 DIAGNOSIS — R1314 Dysphagia, pharyngoesophageal phase: Secondary | ICD-10-CM | POA: Diagnosis not present

## 2019-07-06 DIAGNOSIS — M6281 Muscle weakness (generalized): Secondary | ICD-10-CM | POA: Diagnosis not present

## 2019-07-06 DIAGNOSIS — N3946 Mixed incontinence: Secondary | ICD-10-CM | POA: Diagnosis not present

## 2019-07-06 DIAGNOSIS — R2681 Unsteadiness on feet: Secondary | ICD-10-CM | POA: Diagnosis not present

## 2019-07-06 DIAGNOSIS — R269 Unspecified abnormalities of gait and mobility: Secondary | ICD-10-CM | POA: Diagnosis not present

## 2019-07-07 DIAGNOSIS — R41841 Cognitive communication deficit: Secondary | ICD-10-CM | POA: Diagnosis not present

## 2019-07-07 DIAGNOSIS — N3946 Mixed incontinence: Secondary | ICD-10-CM | POA: Diagnosis not present

## 2019-07-07 DIAGNOSIS — R269 Unspecified abnormalities of gait and mobility: Secondary | ICD-10-CM | POA: Diagnosis not present

## 2019-07-07 DIAGNOSIS — R2681 Unsteadiness on feet: Secondary | ICD-10-CM | POA: Diagnosis not present

## 2019-07-07 DIAGNOSIS — M6281 Muscle weakness (generalized): Secondary | ICD-10-CM | POA: Diagnosis not present

## 2019-07-07 DIAGNOSIS — R1314 Dysphagia, pharyngoesophageal phase: Secondary | ICD-10-CM | POA: Diagnosis not present

## 2019-07-08 DIAGNOSIS — R41841 Cognitive communication deficit: Secondary | ICD-10-CM | POA: Diagnosis not present

## 2019-07-08 DIAGNOSIS — M6281 Muscle weakness (generalized): Secondary | ICD-10-CM | POA: Diagnosis not present

## 2019-07-08 DIAGNOSIS — R1314 Dysphagia, pharyngoesophageal phase: Secondary | ICD-10-CM | POA: Diagnosis not present

## 2019-07-08 DIAGNOSIS — R2681 Unsteadiness on feet: Secondary | ICD-10-CM | POA: Diagnosis not present

## 2019-07-08 DIAGNOSIS — N3946 Mixed incontinence: Secondary | ICD-10-CM | POA: Diagnosis not present

## 2019-07-08 DIAGNOSIS — R269 Unspecified abnormalities of gait and mobility: Secondary | ICD-10-CM | POA: Diagnosis not present

## 2019-07-09 DIAGNOSIS — N3946 Mixed incontinence: Secondary | ICD-10-CM | POA: Diagnosis not present

## 2019-07-09 DIAGNOSIS — M6281 Muscle weakness (generalized): Secondary | ICD-10-CM | POA: Diagnosis not present

## 2019-07-09 DIAGNOSIS — R41841 Cognitive communication deficit: Secondary | ICD-10-CM | POA: Diagnosis not present

## 2019-07-09 DIAGNOSIS — R1314 Dysphagia, pharyngoesophageal phase: Secondary | ICD-10-CM | POA: Diagnosis not present

## 2019-07-09 DIAGNOSIS — Z7389 Other problems related to life management difficulty: Secondary | ICD-10-CM | POA: Diagnosis not present

## 2019-07-09 DIAGNOSIS — H40009 Preglaucoma, unspecified, unspecified eye: Secondary | ICD-10-CM | POA: Diagnosis not present

## 2019-07-09 DIAGNOSIS — R32 Unspecified urinary incontinence: Secondary | ICD-10-CM | POA: Diagnosis not present

## 2019-07-09 DIAGNOSIS — W010XXD Fall on same level from slipping, tripping and stumbling without subsequent striking against object, subsequent encounter: Secondary | ICD-10-CM | POA: Diagnosis not present

## 2019-07-09 DIAGNOSIS — B029 Zoster without complications: Secondary | ICD-10-CM | POA: Diagnosis not present

## 2019-07-09 DIAGNOSIS — H353220 Exudative age-related macular degeneration, left eye, stage unspecified: Secondary | ICD-10-CM | POA: Diagnosis not present

## 2019-07-09 DIAGNOSIS — R296 Repeated falls: Secondary | ICD-10-CM | POA: Diagnosis not present

## 2019-07-09 DIAGNOSIS — H25013 Cortical age-related cataract, bilateral: Secondary | ICD-10-CM | POA: Diagnosis not present

## 2019-07-09 DIAGNOSIS — N951 Menopausal and female climacteric states: Secondary | ICD-10-CM | POA: Diagnosis not present

## 2019-07-13 ENCOUNTER — Encounter (INDEPENDENT_AMBULATORY_CARE_PROVIDER_SITE_OTHER): Payer: Medicare Other | Admitting: Ophthalmology

## 2019-07-13 DIAGNOSIS — H353231 Exudative age-related macular degeneration, bilateral, with active choroidal neovascularization: Secondary | ICD-10-CM

## 2019-07-13 DIAGNOSIS — H43813 Vitreous degeneration, bilateral: Secondary | ICD-10-CM

## 2019-07-14 DIAGNOSIS — R296 Repeated falls: Secondary | ICD-10-CM | POA: Diagnosis not present

## 2019-07-14 DIAGNOSIS — Z7389 Other problems related to life management difficulty: Secondary | ICD-10-CM | POA: Diagnosis not present

## 2019-07-14 DIAGNOSIS — M6281 Muscle weakness (generalized): Secondary | ICD-10-CM | POA: Diagnosis not present

## 2019-07-14 DIAGNOSIS — H353220 Exudative age-related macular degeneration, left eye, stage unspecified: Secondary | ICD-10-CM | POA: Diagnosis not present

## 2019-07-14 DIAGNOSIS — R1314 Dysphagia, pharyngoesophageal phase: Secondary | ICD-10-CM | POA: Diagnosis not present

## 2019-07-14 DIAGNOSIS — N3946 Mixed incontinence: Secondary | ICD-10-CM | POA: Diagnosis not present

## 2019-07-15 DIAGNOSIS — R1314 Dysphagia, pharyngoesophageal phase: Secondary | ICD-10-CM | POA: Diagnosis not present

## 2019-07-15 DIAGNOSIS — H353220 Exudative age-related macular degeneration, left eye, stage unspecified: Secondary | ICD-10-CM | POA: Diagnosis not present

## 2019-07-15 DIAGNOSIS — N3946 Mixed incontinence: Secondary | ICD-10-CM | POA: Diagnosis not present

## 2019-07-15 DIAGNOSIS — M6281 Muscle weakness (generalized): Secondary | ICD-10-CM | POA: Diagnosis not present

## 2019-07-15 DIAGNOSIS — R296 Repeated falls: Secondary | ICD-10-CM | POA: Diagnosis not present

## 2019-07-15 DIAGNOSIS — Z7389 Other problems related to life management difficulty: Secondary | ICD-10-CM | POA: Diagnosis not present

## 2019-07-16 DIAGNOSIS — Z7389 Other problems related to life management difficulty: Secondary | ICD-10-CM | POA: Diagnosis not present

## 2019-07-16 DIAGNOSIS — H353220 Exudative age-related macular degeneration, left eye, stage unspecified: Secondary | ICD-10-CM | POA: Diagnosis not present

## 2019-07-16 DIAGNOSIS — N3946 Mixed incontinence: Secondary | ICD-10-CM | POA: Diagnosis not present

## 2019-07-16 DIAGNOSIS — R296 Repeated falls: Secondary | ICD-10-CM | POA: Diagnosis not present

## 2019-07-16 DIAGNOSIS — M6281 Muscle weakness (generalized): Secondary | ICD-10-CM | POA: Diagnosis not present

## 2019-07-16 DIAGNOSIS — R1314 Dysphagia, pharyngoesophageal phase: Secondary | ICD-10-CM | POA: Diagnosis not present

## 2019-07-17 ENCOUNTER — Encounter: Payer: Self-pay | Admitting: Nurse Practitioner

## 2019-07-17 ENCOUNTER — Telehealth: Payer: Self-pay | Admitting: Family

## 2019-07-17 ENCOUNTER — Non-Acute Institutional Stay: Payer: Medicare Other | Admitting: Nurse Practitioner

## 2019-07-17 DIAGNOSIS — I471 Supraventricular tachycardia: Secondary | ICD-10-CM

## 2019-07-17 DIAGNOSIS — S81811D Laceration without foreign body, right lower leg, subsequent encounter: Secondary | ICD-10-CM

## 2019-07-17 DIAGNOSIS — R609 Edema, unspecified: Secondary | ICD-10-CM

## 2019-07-17 DIAGNOSIS — K219 Gastro-esophageal reflux disease without esophagitis: Secondary | ICD-10-CM | POA: Diagnosis not present

## 2019-07-17 NOTE — Assessment & Plan Note (Signed)
Right lower leg, approximated with steri strips, no active bleeding or s/s of infection.

## 2019-07-17 NOTE — Assessment & Plan Note (Signed)
Stable, continue Omeprazole 20mg qd.  

## 2019-07-17 NOTE — Assessment & Plan Note (Signed)
BLE edema, R>L, continue Furosemide

## 2019-07-17 NOTE — Assessment & Plan Note (Signed)
Heart rate is controlled upon my examination, continue Metoprolol,

## 2019-07-17 NOTE — Progress Notes (Signed)
Location:   [facility] Room Number: 782 Place of Service:  ALF (13) Provider:  Brandn Mcgath X, NP  Iyannah Blake X, NP  Patient Care Team: Crixus Mcaulay X, NP as PCP - General (Internal Medicine)  Extended Emergency Contact Information Primary Emergency Contact: Hogg,TROY Address: [place],  [place] Home Phone: [phone] Relation: None Secondary Emergency Contact: [name] Mobile Phone: [phone] Relation: Friend  Code Status:  DNR Goals of care: Advanced Directive information Advanced Directives 05/30/2019  Does Patient Have a Medical Advance Directive? Yes  Type of Paramedic of [place];Out of facility DNR (pink MOST or yellow form)  Does patient want to make changes to medical advance directive? -  Copy of [place] in Chart? Yes - validated most recent copy scanned in chart (See row information)  Pre-existing out of facility DNR order (yellow form or pink MOST form) -     Chief Complaint  Patient presents with  . Acute Visit    Skin tear, rapid heartbeats    HPI:  [name] seen today for an acute visit for rapid heart beats early today in 120-130s, asymptomatic, declined ED eval, on, HR in 60s now, on Metoprolol 25mg  bid. Edema BLE, trace on Furosemide 10mg  qd. GERD, stable, on Omeprazole 20mg  qd. Skin cut right lower leg, the patient stated she bumped her leg on metal frame on her bed, declined ED, then approximated with steri strips, covered with abd pad.    Past Medical History:  Diagnosis Date  . COVID-19   . 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 No pertinent surgical history.  Allergies  Allergen Reactions  . Azopt [Brinzolamide]   . Brimonidine   . Lumigan [Bimatoprost]     Allergies as of 07/17/2019      Reactions   Azopt [brinzolamide]    Brimonidine    Lumigan [bimatoprost]       Medication List       Accurate as of July 17, 2019  3:01 PM. If you have any questions, ask your nurse or doctor.        STOP taking these medications   aspirin EC 81 MG tablet Stopped by: Arryana Tolleson X Sharnay Cashion, NP     TAKE these medications   b complex vitamins tablet Take 1 tablet by mouth as needed.   Calcium Carbonate-Vitamin D 600-400 MG-UNIT tablet Take 1 tablet by mouth daily.   CENTRUM SILVER PO Take 1 tablet by mouth daily. 7am-10am.   diphenoxylate-atropine 2.5-0.025 MG/5ML liquid Commonly known as: LOMOTIL Take by mouth 3 (three) times daily as needed for diarrhea or loose stools.   furosemide 20 MG tablet Commonly known as: LASIX Take 10 mg by mouth daily.   melatonin 3 MG Tabs tablet Take 3 mg by mouth at bedtime as needed.   metoprolol tartrate 25 MG tablet Commonly known as: LOPRESSOR Take 25 mg by mouth 2 (two) times daily.   NON FORMULARY Moisturizing Cream to extremities with morning and evening care Every Shift   NON FORMULARY Regular Special Instructions: Regular Diet, Thin Liquids   omeprazole 20 MG capsule Commonly known as: PRILOSEC Take 1 capsule (20 mg total) by mouth daily.   potassium chloride 10 MEQ tablet Commonly known as: KLOR-CON Take 10 mEq by mouth daily.   timolol 0.5 % ophthalmic solution  Commonly known as: BETIMOL Place 1 drop into both eyes daily. 7am-10am.       Review of Systems  Constitutional: Negative for activity change, appetite change, fatigue and fever.  HENT: Positive for hearing loss and trouble swallowing. Negative for congestion and voice change.   Eyes: Negative for visual disturbance.  Respiratory: Negative for cough and shortness of breath.   Cardiovascular: Positive for leg swelling. Negative for chest pain and palpitations.  Gastrointestinal: Negative for abdominal distention, abdominal pain and constipation.  Genitourinary: Positive for frequency. Negative for difficulty  urinating, dysuria and urgency.  Musculoskeletal: Positive for gait problem.  Skin: Positive for wound.       Right lowe leg.   Neurological: Negative for speech difficulty, weakness, light-headedness and headaches.  Psychiatric/Behavioral: Negative for agitation, behavioral problems and sleep disturbance.    Immunization History  Administered Date(s) Administered  . Influenza, High Dose Seasonal PF 11/08/2018  . Influenza-Unspecified 11/07/2013  . Moderna SARS-COVID-2 Vaccination 06/06/2019   Pertinent  Health Maintenance Due  Topic Date Due  . PNA vac Low Risk Adult (1 of 2 - PCV13) Never done  . INFLUENZA VACCINE  11/08/2019  . DEXA SCAN  Completed   Fall Risk  05/19/2019 05/14/2019  Falls in the past year? 0 0  Number falls in past yr: 0 0  Injury with Fall? 0 -   Functional Status Survey:    Vitals:   07/17/19 0952  BP: 108/80  Pulse: (!) 128  Resp: (!) 22  Temp: 97.6 F (36.4 C)  SpO2: 97%  Weight: 86 lb 6.4 oz (39.2 kg)  Height: 4\' 5"  (1.346 m)   Body mass index is 21.63 kg/m. Physical Exam Vitals and nursing note reviewed.  Constitutional:      General: She is not in acute distress.    Appearance: Normal appearance. She is not ill-appearing.  HENT:     Head: Normocephalic and atraumatic.     Mouth/Throat:     Mouth: Mucous membranes are moist.  Eyes:     Extraocular Movements: Extraocular movements intact.     Conjunctiva/sclera: Conjunctivae normal.     Pupils: Pupils are equal, round, and reactive to light.  Cardiovascular:     Rate and Rhythm: Normal rate. Rhythm irregular.     Heart sounds: No murmur.  Pulmonary:     Breath sounds: No wheezing, rhonchi or rales.  Abdominal:     General: Bowel sounds are normal. There is no distension.     Palpations: Abdomen is soft.     Tenderness: There is no abdominal tenderness.  Musculoskeletal:     Cervical back: Normal range of motion and neck supple.     Right lower leg: Edema present.     Left lower  leg: Edema present.     Comments: Trace-1+ edema RLE, minimal edema LLE  Skin:    General: Skin is warm and dry.     Findings: Erythema present.     Comments: Anterior right lower leg skin tear is approximated with steri strips, no s/s of infection or active bleeding.   Neurological:     General: No focal deficit present.     Mental Status: She is alert and oriented to person, place, and time. Mental status is at baseline.     Motor: No weakness.     Coordination: Coordination normal.     Gait: Gait abnormal.  Psychiatric:        Mood and Affect: Mood normal.  Behavior: Behavior normal.        Thought Content: Thought content normal.     Labs reviewed: Recent Labs    04/20/19 1122 04/20/19 1122 04/23/19 0000 04/26/19 0414 05/30/19 1158 06/16/19 0000  NA 143   < > 142 141 142 142  K 4.0   < > 4.1 4.1 3.3* 4.2  CL 105   < > 108 106 109 103  CO2 27   < > 27* 26  --  29*  GLUCOSE 91  --   --  109* 114*  --   BUN 15   < > 19 12 18 13   CREATININE 0.96   < > 0.7 0.80 0.60 0.7  CALCIUM 8.0*   < > 8.3* 7.8*  --  8.7   < > = values in this interval not displayed.   Recent Labs    04/23/19 0000 04/26/19 0414 06/16/19 0000  AST 21 33 24  ALT 15 23 25   ALKPHOS 67 59 89  BILITOT  --  0.6  --   PROT  --  4.5*  --   ALBUMIN 3.1* 2.6* 3.6   Recent Labs    04/20/19 1122 04/20/19 1122 04/23/19 0000 04/23/19 0000 04/26/19 0414 05/30/19 1158 06/16/19 0000  WBC 8.1  --  3.8  --  5.5  --  6.1  NEUTROABS 7.1  --  2,569  --  4.1  --  3,892  HGB 14.0   < > 13.4   < > 15.3* 14.3 12.7  HCT 43.5   < > 39   < > 47.5* 42.0 39  MCV 98.2  --   --   --  99.8  --   --   PLT 322   < > 281  --  226  --  257   < > = values in this interval not displayed.   No results found for: TSH No results found for: HGBA1C No results found for: CHOL, HDL, LDLCALC, LDLDIRECT, TRIG, CHOLHDL  Significant Diagnostic Results in last 30 days:  No results found.  Assessment/Plan Paroxysmal SVT  (supraventricular tachycardia) (HCC) Heart rate is controlled upon my examination, continue Metoprolol,  Skin tear of right lower leg without complication Right lower leg, approximated with steri strips, no active bleeding or s/s of infection.   GERD (gastroesophageal reflux disease) Stable, continue Omeprazole 20mg  qd.   Edema BLE edema, R>L, continue Furosemide     Family/ staff Communication: plan of care reviewed with the patient and charge nurse.   Labs/tests ordered:  none  Time spend 40 minutes.

## 2019-07-17 NOTE — Telephone Encounter (Signed)
Call received at 7: 55 Am from facility Nurse states patient sustained a deep laceration bleeding stopped patient declined to be send to ED for evaluation.Steri-strips applied and covered with gauze.patient's Heart rate was 135 b/min,B/p stable.patient has declined to go to ED.advised Nurse to administered schedule dose of Metoprolol tartrate 25 mg tablet then recheck HR.Provider to evaluate at the facility.

## 2019-07-20 DIAGNOSIS — N3946 Mixed incontinence: Secondary | ICD-10-CM | POA: Diagnosis not present

## 2019-07-20 DIAGNOSIS — M6281 Muscle weakness (generalized): Secondary | ICD-10-CM | POA: Diagnosis not present

## 2019-07-20 DIAGNOSIS — R1314 Dysphagia, pharyngoesophageal phase: Secondary | ICD-10-CM | POA: Diagnosis not present

## 2019-07-20 DIAGNOSIS — R296 Repeated falls: Secondary | ICD-10-CM | POA: Diagnosis not present

## 2019-07-20 DIAGNOSIS — H353220 Exudative age-related macular degeneration, left eye, stage unspecified: Secondary | ICD-10-CM | POA: Diagnosis not present

## 2019-07-20 DIAGNOSIS — Z7389 Other problems related to life management difficulty: Secondary | ICD-10-CM | POA: Diagnosis not present

## 2019-07-21 DIAGNOSIS — R1314 Dysphagia, pharyngoesophageal phase: Secondary | ICD-10-CM | POA: Diagnosis not present

## 2019-07-21 DIAGNOSIS — R296 Repeated falls: Secondary | ICD-10-CM | POA: Diagnosis not present

## 2019-07-21 DIAGNOSIS — M6281 Muscle weakness (generalized): Secondary | ICD-10-CM | POA: Diagnosis not present

## 2019-07-21 DIAGNOSIS — H353220 Exudative age-related macular degeneration, left eye, stage unspecified: Secondary | ICD-10-CM | POA: Diagnosis not present

## 2019-07-21 DIAGNOSIS — Z7389 Other problems related to life management difficulty: Secondary | ICD-10-CM | POA: Diagnosis not present

## 2019-07-21 DIAGNOSIS — N3946 Mixed incontinence: Secondary | ICD-10-CM | POA: Diagnosis not present

## 2019-07-23 DIAGNOSIS — M6281 Muscle weakness (generalized): Secondary | ICD-10-CM | POA: Diagnosis not present

## 2019-07-23 DIAGNOSIS — N3946 Mixed incontinence: Secondary | ICD-10-CM | POA: Diagnosis not present

## 2019-07-23 DIAGNOSIS — R296 Repeated falls: Secondary | ICD-10-CM | POA: Diagnosis not present

## 2019-07-23 DIAGNOSIS — R1314 Dysphagia, pharyngoesophageal phase: Secondary | ICD-10-CM | POA: Diagnosis not present

## 2019-07-23 DIAGNOSIS — H353220 Exudative age-related macular degeneration, left eye, stage unspecified: Secondary | ICD-10-CM | POA: Diagnosis not present

## 2019-07-23 DIAGNOSIS — Z7389 Other problems related to life management difficulty: Secondary | ICD-10-CM | POA: Diagnosis not present

## 2019-07-24 ENCOUNTER — Non-Acute Institutional Stay: Payer: Medicare Other | Admitting: Internal Medicine

## 2019-07-24 ENCOUNTER — Encounter: Payer: Self-pay | Admitting: Internal Medicine

## 2019-07-24 DIAGNOSIS — R109 Unspecified abdominal pain: Secondary | ICD-10-CM | POA: Diagnosis not present

## 2019-07-24 DIAGNOSIS — K529 Noninfective gastroenteritis and colitis, unspecified: Secondary | ICD-10-CM

## 2019-07-24 DIAGNOSIS — I10 Essential (primary) hypertension: Secondary | ICD-10-CM | POA: Diagnosis not present

## 2019-07-24 DIAGNOSIS — I471 Supraventricular tachycardia: Secondary | ICD-10-CM

## 2019-07-24 DIAGNOSIS — K219 Gastro-esophageal reflux disease without esophagitis: Secondary | ICD-10-CM

## 2019-07-24 DIAGNOSIS — R609 Edema, unspecified: Secondary | ICD-10-CM | POA: Diagnosis not present

## 2019-07-24 LAB — CBC: RBC: 3.97 (ref 3.87–5.11)

## 2019-07-24 LAB — HEPATIC FUNCTION PANEL
ALT: 24 (ref 7–35)
AST: 22 (ref 13–35)
Alkaline Phosphatase: 84 (ref 25–125)
Bilirubin, Total: 0.5

## 2019-07-24 LAB — CBC AND DIFFERENTIAL
HCT: 39 (ref 36–46)
Hemoglobin: 12.8 (ref 12.0–16.0)
Neutrophils Absolute: 7334
Platelets: 232 (ref 150–399)
WBC: 9.5

## 2019-07-24 LAB — BASIC METABOLIC PANEL
BUN: 26 — AB (ref 4–21)
CO2: 27 — AB (ref 13–22)
Chloride: 105 (ref 99–108)
Creatinine: 1 (ref 0.5–1.1)
Glucose: 106
Potassium: 3.6 (ref 3.4–5.3)
Sodium: 138 (ref 137–147)

## 2019-07-24 LAB — COMPREHENSIVE METABOLIC PANEL
Albumin: 3.9 (ref 3.5–5.0)
Calcium: 8.4 — AB (ref 8.7–10.7)
Globulin: 2.3

## 2019-07-24 NOTE — Progress Notes (Signed)
Location:   [facility] Room Number: 790 Place of Service:  ALF [phone]) Provider:  Veleta Miners MD  [name], [name], NP  Patient Care Team: [name], [name], NP as PCP - General (Internal Medicine)  Extended Emergency Contact Information Primary Emergency Contact: Hodgkins,TROY Address: [place],  [place] Home Phone: [phone] Relation: None Secondary Emergency Contact: [name] Mobile Phone: [phone] Relation: Friend  Code Status:  DNR Goals of care: Advanced Directive information Advanced Directives 05/30/2019  Does Patient Have a Medical Advance Directive? Yes  Type of Paramedic of [place];Out of facility DNR (pink MOST or yellow form)  Does patient want to make changes to medical advance directive? -  Copy of [place] in Chart? Yes - validated most recent copy scanned in chart (See row information)  Pre-existing out of facility DNR order (yellow form or pink MOST form) -     Chief Complaint  Patient presents with  . Acute Visit    Abdominal pain    HPI:  [name] seen today for an acute visit for abdominal pain and possibly low-grade temp  [place] lives [place] Has history of PSVT , Chronic Diarrhea,GERD and Cognitive Impairment  She c/o Pain in her Epigastric are yesterday night to nurse and asked for Pepto bismol But Nurse could not get her at night. She also had Low grade Temp of 100.3 She denies any Nausea or vomiting. No dysuria Her only complain today was Diarrhea. She says she is going 5-6 times and also taking Lomotil. Her pain is now in Lower abdomen Ate her Breakfast and Lunch with no issues    Past Medical History:  Diagnosis Date  . COVID-19   . 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 No pertinent surgical history.  Allergies  Allergen Reactions  .  Azopt [Brinzolamide]   . Brimonidine   . Lumigan [Bimatoprost]     Allergies as of 07/24/2019      Reactions   Azopt [brinzolamide]    Brimonidine    Lumigan [bimatoprost]       Medication List       Accurate as of July 24, 2019  3:19 PM. If you have any questions, ask your nurse or doctor.        acetaminophen 325 MG tablet Commonly known as: TYLENOL Take 650 mg by mouth every 4 (four) hours as needed.   b complex vitamins tablet Take 1 tablet by mouth as needed.   Calcium Carbonate-Vitamin D 600-400 MG-UNIT tablet Take 1 tablet by mouth daily.   CENTRUM SILVER PO Take 1 tablet by mouth daily. 7am-10am.   diphenoxylate-atropine 2.5-0.025 MG/5ML liquid Commonly known as: LOMOTIL Take by mouth 3 (three) times daily as needed for diarrhea or loose stools.   furosemide 20 MG tablet Commonly known as: LASIX Take 10 mg by mouth daily.   melatonin 3 MG Tabs tablet Take 3 mg by mouth at bedtime as needed.   metoprolol tartrate 25 MG tablet Commonly known as: LOPRESSOR Take 25 mg by mouth 2 (two) times daily.   NON FORMULARY Moisturizing Cream to extremities with morning and evening care Every Shift   NON FORMULARY Regular Special Instructions: Regular Diet, Thin Liquids   omeprazole 20 MG capsule Commonly known as: PRILOSEC Take 1 capsule (20 mg total) by mouth  daily.   potassium chloride 10 MEQ tablet Commonly known as: KLOR-CON Take 10 mEq by mouth daily.   timolol 0.5 % ophthalmic solution Commonly known as: BETIMOL Place 1 drop into both eyes daily. 7am-10am.       Review of Systems  Constitutional: Negative.   HENT: Negative.   Respiratory: Negative.   Cardiovascular: Positive for leg swelling.  Gastrointestinal: Positive for abdominal distention, abdominal pain and diarrhea.  Genitourinary: Negative.   Musculoskeletal: Negative.   Neurological: Negative.   Psychiatric/Behavioral: Positive for confusion.    Immunization History    Administered Date(s) Administered  . Influenza, High Dose Seasonal PF 11/08/2018  . Influenza-Unspecified 11/07/2013  . Moderna SARS-COVID-2 Vaccination 06/06/2019   Pertinent  Health Maintenance Due  Topic Date Due  . PNA vac Low Risk Adult (1 of 2 - PCV13) Never done  . INFLUENZA VACCINE  11/08/2019  . DEXA SCAN  Completed   Fall Risk  05/19/2019 05/14/2019  Falls in the past year? 0 0  Number falls in past yr: 0 0  Injury with Fall? 0 -   Functional Status Survey:    Vitals:   07/24/19 1514  BP: 120/60  Pulse: 66  Resp: 20  Temp: 98.1 F (36.7 C)  SpO2: 99%  Weight: 86 lb 6.4 oz (39.2 kg)  Height: 4\' 5"  (1.346 m)   Body mass index is 21.63 kg/m. Physical Exam  Constitutional: Oriented to person, place, and time. Frail and [place]  HENT:  Head: Normocephalic.  Mouth/Throat: Oropharynx is clear and moist.  Eyes: Pupils are equal, round, and reactive to light.  Neck: Neck supple.  Cardiovascular: Normal rate and normal heart sounds.  No murmur heard. Pulmonary/Chest: Effort normal and breath sounds normal. No respiratory distress. No wheezes. She has no rales.  Abdominal: Soft.  C/O Lower abdomen Discomfort No Distension. BS present. No Rebound Tenderness  Musculoskeletal: Mild edema.  Lymphadenopathy: none Neurological: Alert and oriented to person, place, and time.  Skin: Skin is warm and dry.  Psychiatric: Normal mood and affect. Behavior is normal. Thought content normal.    Labs reviewed: Recent Labs    04/20/19 1122 04/20/19 1122 04/23/19 0000 04/26/19 0414 05/30/19 1158 06/16/19 0000  NA 143   < > 142 141 142 142  K 4.0   < > 4.1 4.1 3.3* 4.2  CL 105   < > 108 106 109 103  CO2 27   < > 27* 26  --  29*  GLUCOSE 91  --   --  109* 114*  --   BUN 15   < > 19 12 18 13   CREATININE 0.96   < > 0.7 0.80 0.60 0.7  CALCIUM 8.0*   < > 8.3* 7.8*  --  8.7   < > = values in this interval not displayed.   Recent Labs    04/23/19 0000 04/26/19 0414  06/16/19 0000  AST 21 33 24  ALT 15 23 25   ALKPHOS 67 59 89  BILITOT  --  0.6  --   PROT  --  4.5*  --   ALBUMIN 3.1* 2.6* 3.6   Recent Labs    04/20/19 1122 04/20/19 1122 04/23/19 0000 04/23/19 0000 04/26/19 0414 05/30/19 1158 06/16/19 0000  WBC 8.1  --  3.8  --  5.5  --  6.1  NEUTROABS 7.1  --  2,569  --  4.1  --  3,892  HGB 14.0   < > 13.4   < > 15.3*  14.3 12.7  HCT 43.5   < > 39   < > 47.5* 42.0 39  MCV 98.2  --   --   --  99.8  --   --   PLT 322   < > 281  --  226  --  257   < > = values in this interval not displayed.   No results found for: TSH No results found for: HGBA1C No results found for: CHOL, HDL, LDLCALC, LDLDIRECT, TRIG, CHOLHDL  Significant Diagnostic Results in last 30 days:  No results found.  Assessment/Plan Abdominal pain, unspecified abdominal location Not sure of Etiology Will get few Labs CBC and CMP and Lipase Also Get Abdominal Xray to rule out Constipation as she uses her Lomotil PRN  Chronic diarrhea Has been on Lomotil for many years Says it is not that bad as it was before but does bother her Check Stool for WBC and Stool for C Diff ? GI consult  Paroxysmal SVT (supraventricular tachycardia) ( Has been more stable on Lopressor Still get breakthrough incidents But refuses to go to ED  Gastroesophageal reflux disease, t Due to her pain increase her Omeprazole to BID for few weeks Can also have Peptobismal if she needs  Edema, unspecified type On Low dose of Lasix     Family/ staff Communication:   Labs/tests ordered:   Total time spent in this patient care encounter was  45_  minutes; greater than 50% of the visit spent counseling patient and staff, reviewing records , Labs and coordinating care for problems addressed at this encounter.

## 2019-07-25 DIAGNOSIS — R109 Unspecified abdominal pain: Secondary | ICD-10-CM | POA: Diagnosis not present

## 2019-07-26 DIAGNOSIS — I10 Essential (primary) hypertension: Secondary | ICD-10-CM | POA: Diagnosis not present

## 2019-07-27 ENCOUNTER — Non-Acute Institutional Stay: Payer: Medicare Other | Admitting: Nurse Practitioner

## 2019-07-27 DIAGNOSIS — R609 Edema, unspecified: Secondary | ICD-10-CM | POA: Diagnosis not present

## 2019-07-27 DIAGNOSIS — K529 Noninfective gastroenteritis and colitis, unspecified: Secondary | ICD-10-CM

## 2019-07-27 DIAGNOSIS — K219 Gastro-esophageal reflux disease without esophagitis: Secondary | ICD-10-CM

## 2019-07-27 DIAGNOSIS — I471 Supraventricular tachycardia: Secondary | ICD-10-CM

## 2019-07-27 DIAGNOSIS — L089 Local infection of the skin and subcutaneous tissue, unspecified: Secondary | ICD-10-CM | POA: Insufficient documentation

## 2019-07-27 DIAGNOSIS — T148XXA Other injury of unspecified body region, initial encounter: Secondary | ICD-10-CM | POA: Diagnosis not present

## 2019-07-27 NOTE — Assessment & Plan Note (Signed)
No c/o abd pain today, 07/24/19 Na 138, K 3.6, Bun 26, creat 0.96, eGFR 50, Lipase 19, wbc 9.5, Hgb 12.8, plt 232, neutrophils 77.2 07/25/19 X-ray abd no bowel obstruction, moderate stool in colon compatible with constipation. Continue prn Imodium, Omeprazole bid

## 2019-07-27 NOTE — Progress Notes (Addendum)
Location:   [facility] Room Number: 767 Place of Service:  ALF (13) Provider: Marlana Latus NP  Creasie Lacosse X, NP  Patient Care Team: Jaythen Hamme X, NP as PCP - General (Internal Medicine)  Extended Emergency Contact Information Primary Emergency Contact: Kinzler,TROY Address: [place],  [place] Home Phone: [phone] Relation: None Secondary Emergency Contact: [name] Mobile Phone: [phone] Relation: Friend  Code Status: DNR Goals of care: Advanced Directive information Advanced Directives 07/28/2019  Does Patient Have a Medical Advance Directive? Yes  Type of Advance Directive Living will;Healthcare Power of [place];Out of facility DNR (pink MOST or yellow form)  Does patient want to make changes to medical advance directive? No - Patient declined  Copy of [place] in Chart? Yes - validated most recent copy scanned in chart (See row information)  Pre-existing out of facility DNR order (yellow form or pink MOST form) Yellow form placed in chart (order not valid for inpatient use)     Chief Complaint  Patient presents with  . Acute Visit    Skin tear    HPI:  [name] seen today for an acute visit for reported non healing skin tear RLE since 07/17/19, slightly erythema peri wound. Hx of SVT, HR in 130s, asymptomatic, on Metoprolol 61m bid, Bp runs low. BLE edema, trace on Furosemide 177mqd. abd pain/diarrhea/GERD, no complaints today, on Omeprazole bid, prn Imodium, CBC/diff, X-ray abd unremarkable.    Past Medical History:  Diagnosis Date  . COVID-19   . 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 No pertinent surgical history.  Allergies  Allergen Reactions  . Azopt [Brinzolamide]   . Brimonidine   . Lumigan [Bimatoprost]     Allergies as of 07/27/2019      Reactions   Azopt [brinzolamide]     Brimonidine    Lumigan [bimatoprost]       Medication List       Accurate as of July 27, 2019 11:59 PM. If you have any questions, ask your nurse or doctor.        b complex vitamins tablet Take 1 tablet by mouth as needed.   bismuth subsalicylate 26650GPT/46FKuspension Commonly known as: PEPTO BISMOL Take 60 mLs by mouth daily as needed.   Calcium Carbonate-Vitamin D 600-400 MG-UNIT tablet Take 1 tablet by mouth daily.   CENTRUM SILVER PO Take 1 tablet by mouth daily. 7am-10am.   diphenoxylate-atropine 2.5-0.025 MG/5ML liquid Commonly known as: LOMOTIL Take by mouth 3 (three) times daily as needed for diarrhea or loose stools.   furosemide 20 MG tablet Commonly known as: LASIX Take 10 mg by mouth daily.   melatonin 3 MG Tabs tablet Take 3 mg by mouth at bedtime as needed.   metoprolol tartrate 25 MG tablet Commonly known as: LOPRESSOR Take 25 mg by mouth 2 (two) times daily.   NON FORMULARY Moisturizing Cream to extremities with morning and evening care Every Shift   NON FORMULARY Regular Special Instructions: Regular Diet, Thin Liquids   omeprazole 20 MG capsule Commonly known as: PRILOSEC Take 1 capsule (20 mg total) by mouth daily.   potassium chloride 10 MEQ tablet Commonly known as: KLOR-CON Take 10 mEq by mouth daily.   silver sulfADIAZINE 1 % cream Commonly known as: SILVADENE Apply 1 application topically 2 (two) times daily.  timolol 0.5 % ophthalmic solution Commonly known as: BETIMOL Place 1 drop into both eyes daily. 7am-10am.       Review of Systems  Constitutional: Negative for activity change, appetite change, fatigue and fever.  HENT: Positive for hearing loss and trouble swallowing. Negative for congestion and voice change.   Eyes: Negative for visual disturbance.  Respiratory: Negative for cough and shortness of breath.   Cardiovascular: Positive for leg swelling. Negative for chest pain and palpitations.       RLE    Gastrointestinal: Negative for abdominal distention, abdominal pain and constipation.  Genitourinary: Positive for frequency. Negative for difficulty urinating, dysuria and urgency.  Musculoskeletal: Positive for gait problem.  Skin: Positive for wound.       Right lowe leg non healing skin tear  Neurological: Negative for dizziness, speech difficulty and weakness.  Psychiatric/Behavioral: Negative for agitation, behavioral problems and sleep disturbance.    Immunization History  Administered Date(s) Administered  . Influenza, High Dose Seasonal PF 11/08/2018  . Influenza-Unspecified 11/07/2013  . Moderna SARS-COVID-2 Vaccination 06/06/2019   Pertinent  Health Maintenance Due  Topic Date Due  . PNA vac Low Risk Adult (1 of 2 - PCV13) Never done  . INFLUENZA VACCINE  11/08/2019  . DEXA SCAN  Completed   Fall Risk  05/19/2019 05/14/2019  Falls in the past year? 0 0  Number falls in past yr: 0 0  Injury with Fall? 0 -   Functional Status Survey:    Vitals:   07/28/19 1510  BP: 100/60  Pulse: 90  Resp: 20  Temp: 98.1 F (36.7 C)  SpO2: 98%  Weight: 86 lb 6.4 oz (39.2 kg)  Height: '4\' 5"'  (1.346 m)   Body mass index is 21.63 kg/m. Physical Exam Vitals and nursing note reviewed.  Constitutional:      General: She is not in acute distress.    Appearance: Normal appearance. She is not ill-appearing.  HENT:     Head: Normocephalic and atraumatic.     Mouth/Throat:     Mouth: Mucous membranes are moist.  Eyes:     Extraocular Movements: Extraocular movements intact.     Conjunctiva/sclera: Conjunctivae normal.     Pupils: Pupils are equal, round, and reactive to light.  Cardiovascular:     Rate and Rhythm: Tachycardia present. Rhythm irregular.     Heart sounds: No murmur.  Pulmonary:     Breath sounds: No wheezing or rales.  Abdominal:     General: Bowel sounds are normal. There is no distension.     Palpations: Abdomen is soft.     Tenderness: There is no abdominal  tenderness.  Musculoskeletal:     Cervical back: Normal range of motion and neck supple.     Right lower leg: Edema present.     Left lower leg: No edema.     Comments: 1+ edema RLE  Skin:    General: Skin is warm and dry.     Findings: Erythema present.     Comments: Anterior right lower leg skin tear, non healing, mild peri wound erythema, no odor or purulent drainage, wound bed is granulated, no heat in the area.   Neurological:     General: No focal deficit present.     Mental Status: She is alert and oriented to person, place, and time. Mental status is at baseline.     Motor: No weakness.     Coordination: Coordination normal.     Gait: Gait abnormal.  Psychiatric:  Mood and Affect: Mood normal.        Behavior: Behavior normal.        Thought Content: Thought content normal.     Labs reviewed: Recent Labs    04/20/19 1122 04/20/19 1122 04/23/19 0000 04/26/19 0414 05/30/19 1158 06/16/19 0000  NA 143   < > 142 141 142 142  K 4.0   < > 4.1 4.1 3.3* 4.2  CL 105   < > 108 106 109 103  CO2 27   < > 27* 26  --  29*  GLUCOSE 91  --   --  109* 114*  --   BUN 15   < > '19 12 18 13  ' CREATININE 0.96   < > 0.7 0.80 0.60 0.7  CALCIUM 8.0*   < > 8.3* 7.8*  --  8.7   < > = values in this interval not displayed.   Recent Labs    04/23/19 0000 04/26/19 0414 06/16/19 0000  AST 21 33 24  ALT '15 23 25  ' ALKPHOS 67 59 89  BILITOT  --  0.6  --   PROT  --  4.5*  --   ALBUMIN 3.1* 2.6* 3.6   Recent Labs    04/20/19 1122 04/20/19 1122 04/23/19 0000 04/23/19 0000 04/26/19 0414 05/30/19 1158 06/16/19 0000  WBC 8.1  --  3.8  --  5.5  --  6.1  NEUTROABS 7.1  --  2,569  --  4.1  --  3,892  HGB 14.0   < > 13.4   < > 15.3* 14.3 12.7  HCT 43.5   < > 39   < > 47.5* 42.0 39  MCV 98.2  --   --   --  99.8  --   --   PLT 322   < > 281  --  226  --  257   < > = values in this interval not displayed.   No results found for: TSH No results found for: HGBA1C No results found  for: CHOL, HDL, LDLCALC, LDLDIRECT, TRIG, CHOLHDL  Significant Diagnostic Results in last 30 days:  No results found.  Assessment/Plan: Infected skin tear Reported non healing skin tear RLE since 07/17/19, slightly erythema peri wound, no odor or purulent drainage, wound bed is granulated. Will apply Silvadene cream bid x 10 days. Observe.  07/31/19 dc Silvadene, little efficacy, Bactroban oint bid x 10 days, Doxycycline 123m bid x 10 days, FloraStor bid x 10 days.   Paroxysmal SVT (supraventricular tachycardia) (HCC) HR in 130s at times, asymptomatic, continue Metoprolol 244mbid.   Edema Trace edema RLE, continue Furosemide 1031md.   GERD (gastroesophageal reflux disease) Stable, continue Omeprazole bid.   Chronic diarrhea No c/o abd pain today, 07/24/19 Na 138, K 3.6, Bun 26, creat 0.96, eGFR 50, Lipase 19, wbc 9.5, Hgb 12.8, plt 232, neutrophils 77.2 07/25/19 X-ray abd no bowel obstruction, moderate stool in colon compatible with constipation. Continue prn Imodium, Omeprazole bid    Family/ staff Communication: plan of care reviewed with the patient and charge nurse.   Labs/tests ordered:  none  Time spend 40 minutes.

## 2019-07-27 NOTE — Assessment & Plan Note (Signed)
HR in 130s at times, asymptomatic, continue Metoprolol 25mg  bid.

## 2019-07-27 NOTE — Assessment & Plan Note (Signed)
Stable, continue Omeprazole bid.

## 2019-07-27 NOTE — Assessment & Plan Note (Signed)
Trace edema RLE, continue Furosemide 10mg  qd.

## 2019-07-27 NOTE — Assessment & Plan Note (Addendum)
Reported non healing skin tear RLE since 07/17/19, slightly erythema peri wound, no odor or purulent drainage, wound bed is granulated. Will apply Silvadene cream bid x 10 days. Observe.  07/31/19 dc Silvadene, little efficacy, Bactroban oint bid x 10 days, Doxycycline 100mg  bid x 10 days, FloraStor bid x 10 days.

## 2019-07-28 ENCOUNTER — Encounter: Payer: Self-pay | Admitting: Nurse Practitioner

## 2019-07-28 DIAGNOSIS — M6281 Muscle weakness (generalized): Secondary | ICD-10-CM | POA: Diagnosis not present

## 2019-07-28 DIAGNOSIS — H353220 Exudative age-related macular degeneration, left eye, stage unspecified: Secondary | ICD-10-CM | POA: Diagnosis not present

## 2019-07-28 DIAGNOSIS — R1314 Dysphagia, pharyngoesophageal phase: Secondary | ICD-10-CM | POA: Diagnosis not present

## 2019-07-28 DIAGNOSIS — Z7389 Other problems related to life management difficulty: Secondary | ICD-10-CM | POA: Diagnosis not present

## 2019-07-28 DIAGNOSIS — R296 Repeated falls: Secondary | ICD-10-CM | POA: Diagnosis not present

## 2019-07-28 DIAGNOSIS — N3946 Mixed incontinence: Secondary | ICD-10-CM | POA: Diagnosis not present

## 2019-08-10 ENCOUNTER — Encounter (INDEPENDENT_AMBULATORY_CARE_PROVIDER_SITE_OTHER): Payer: Medicare Other | Admitting: Ophthalmology

## 2019-08-10 DIAGNOSIS — H353231 Exudative age-related macular degeneration, bilateral, with active choroidal neovascularization: Secondary | ICD-10-CM

## 2019-08-10 DIAGNOSIS — H43813 Vitreous degeneration, bilateral: Secondary | ICD-10-CM

## 2019-08-25 DIAGNOSIS — H401131 Primary open-angle glaucoma, bilateral, mild stage: Secondary | ICD-10-CM | POA: Diagnosis not present

## 2019-09-01 DIAGNOSIS — L84 Corns and callosities: Secondary | ICD-10-CM | POA: Diagnosis not present

## 2019-09-01 DIAGNOSIS — M216X1 Other acquired deformities of right foot: Secondary | ICD-10-CM | POA: Diagnosis not present

## 2019-09-01 DIAGNOSIS — B351 Tinea unguium: Secondary | ICD-10-CM | POA: Diagnosis not present

## 2019-09-08 ENCOUNTER — Encounter (INDEPENDENT_AMBULATORY_CARE_PROVIDER_SITE_OTHER): Payer: Medicare Other | Admitting: Ophthalmology

## 2019-09-08 DIAGNOSIS — H43813 Vitreous degeneration, bilateral: Secondary | ICD-10-CM | POA: Diagnosis not present

## 2019-09-08 DIAGNOSIS — H353231 Exudative age-related macular degeneration, bilateral, with active choroidal neovascularization: Secondary | ICD-10-CM

## 2019-09-22 ENCOUNTER — Non-Acute Institutional Stay: Payer: Medicare Other | Admitting: Internal Medicine

## 2019-09-22 ENCOUNTER — Encounter: Payer: Self-pay | Admitting: Internal Medicine

## 2019-09-22 DIAGNOSIS — S81811S Laceration without foreign body, right lower leg, sequela: Secondary | ICD-10-CM

## 2019-09-22 DIAGNOSIS — I471 Supraventricular tachycardia: Secondary | ICD-10-CM

## 2019-09-22 DIAGNOSIS — R609 Edema, unspecified: Secondary | ICD-10-CM

## 2019-09-22 DIAGNOSIS — K219 Gastro-esophageal reflux disease without esophagitis: Secondary | ICD-10-CM

## 2019-09-22 DIAGNOSIS — K529 Noninfective gastroenteritis and colitis, unspecified: Secondary | ICD-10-CM

## 2019-09-22 NOTE — Progress Notes (Signed)
Location: [facility] Room Number: [id number] Place of Service:  ALF [phone])  Provider: Veleta Miners MD   Code Status: DNR Goals of Care:  Advanced Directives 09/22/2019  Does Patient Have a Medical Advance Directive? Yes  Type of Advance Directive Living will;Out of facility DNR (pink MOST or yellow form)  Does patient want to make changes to medical advance directive? No - Patient declined  Copy of [place] in Chart? -  Pre-existing out of facility DNR order (yellow form or pink MOST form) Yellow form placed in chart (order not valid for inpatient use)     Chief Complaint  Patient presents with  . Medical Management of Chronic Issues  . Acute Visit    LE swelling  . Health Maintenance    PNA, TDAP, 2nd covid vacc    HPI: Patient is a 86 y.o. female seen today for an acute visit for lower extremity swelling  Patient lives in [place] Has history of PSVT , Chronic Diarrhea,GERD and Cognitive Impairment  Her active problems Lower extremity edema Patient has gained almost 10 pounds since my last visit continues to have swelling in both her legs A draining skin tear in her right leg Was treated with antibiotics before.  That was 2 months ago but she still has that small opening from where she is draining discharge PSVT Was started on Lopressor.  But still goes in and out of increased heart rate.  States completely asymptomatic.  Today her heart rate was 125. Denies any dizziness has not had any falls refuses to go to ER or  see a specialist Diarrhea This seems to be her chronic problem for many years.  She uses Lomotil as needed.  She has been C. difficile negative and denies any abdominal pain  Walks with a walker is independent in her ADLs.  Past Medical History:  Diagnosis Date  . COVID-19   . 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 No pertinent surgical  history.  Allergies  Allergen Reactions  . Azopt [Brinzolamide]   . Brimonidine   . Lumigan [Bimatoprost]     Outpatient Encounter Medications as of 09/22/2019  Medication Sig  . b complex vitamins tablet Take 1 tablet by mouth as needed.   . bismuth subsalicylate (PEPTO BISMOL) 262 MG/15ML suspension Take 60 mLs by mouth daily as needed.  . Calcium Carbonate-Vitamin D 600-400 MG-UNIT tablet Take 1 tablet by mouth daily.  . diphenoxylate-atropine (LOMOTIL) 2.5-0.025 MG/5ML liquid Take by mouth 3 (three) times daily as needed for diarrhea or loose stools.  . furosemide (LASIX) 20 MG tablet Take 10 mg by mouth daily.  . melatonin 3 MG TABS tablet Take 3 mg by mouth at bedtime as needed.  . metoprolol tartrate (LOPRESSOR) 25 MG tablet Take 25 mg by mouth 2 (two) times daily.  . metoprolol tartrate (LOPRESSOR) 25 MG tablet Take 12.5 mg by mouth once.  . Multiple Vitamins-Minerals (CENTRUM SILVER PO) Take 1 tablet by mouth daily. 7am-10am.  . NON FORMULARY Moisturizing Cream to extremities with morning and evening care Every Shift  . NON FORMULARY Regular Special Instructions: Regular Diet, Thin Liquids  . omeprazole (PRILOSEC) 20 MG capsule Take 20 mg by mouth in the morning and at bedtime.  . potassium chloride (KLOR-CON) 10 MEQ tablet Take 10 mEq by mouth daily.  Marland Kitchen senna (SENOKOT) 8.6 MG TABS tablet Take 1 tablet by mouth daily as  needed for mild constipation.  . timolol (BETIMOL) 0.5 % ophthalmic solution Place 1 drop into both eyes daily. 7am-10am.  . [DISCONTINUED] omeprazole (PRILOSEC) 20 MG capsule Take 1 capsule (20 mg total) by mouth daily.   No facility-administered encounter medications on file as of 09/22/2019.    Review of Systems:  Review of Systems  Review of Systems  Constitutional: Negative for activity change, appetite change, chills, diaphoresis, fatigue and fever.  HENT: Negative for mouth sores, postnasal drip, rhinorrhea, sinus pain and sore throat.     Respiratory: Negative for apnea, cough, chest tightness, shortness of breath and wheezing.   Cardiovascular: Negative for chest pain, palpitations  Gastrointestinal: Negative for abdominal distention, abdominal pain, constipation,nausea and vomiting.  Genitourinary: Negative for dysuria and frequency.  Musculoskeletal: Negative for arthralgias, joint swelling and myalgias.  Skin: Negative for rash.  Neurological: Negative for dizziness, syncope, weakness, light-headedness and numbness.  Psychiatric/Behavioral: Negative for behavioral problems, confusion and sleep disturbance.     Health Maintenance  Topic Date Due  . TETANUS/TDAP  Never done  . PNA vac Low Risk Adult (1 of 2 - PCV13) Never done  . COVID-19 Vaccine (2 - Moderna 2-dose series) 07/04/2019  . INFLUENZA VACCINE  11/08/2019  . DEXA SCAN  Completed    Physical Exam: Vitals:   09/22/19 1551  BP: 116/66  Pulse: 65  Resp: 18  Temp: 98.2 F (36.8 C)  SpO2: 94%  Weight: 96 lb 12.8 oz (43.9 kg)  Height: 4\' 5"  (1.346 m)   Body mass index is 24.23 kg/m. Physical Exam Constitutional: Oriented to person, place, and time. Well-developed and well-nourished.  HENT:  Head: Normocephalic.  Mouth/Throat: Oropharynx is clear and moist.  Eyes: Pupils are equal, round, and reactive to light.  Neck: Neck supple.  Cardiovascular: Tachycardia and normal heart sounds.  No murmur heard. Pulmonary/Chest: Effort normal and breath sounds normal. No respiratory distress. No wheezes. She has no rales.  Abdominal: Soft. Bowel sounds are normal. No distension. There is no tenderness. There is no rebound.  Musculoskeletal: Bilateral Moderate Edema With Draining from the Skin Tear in right LE Lymphadenopathy: none Neurological: Alert and oriented to person, place, and time.  Walks with the walker Skin: Skin is warm and dry.  Psychiatric: Normal mood and affect. Behavior is normal. Thought content normal.   Labs reviewed: Basic  Metabolic Panel: Recent Labs    04/20/19 1122 04/23/19 0000 04/26/19 0414 04/26/19 0414 05/30/19 1158 06/16/19 0000 07/24/19 0000  NA 143   < > 141   < > 142 142 138  K 4.0   < > 4.1   < > 3.3* 4.2 3.6  CL 105   < > 106   < > 109 103 105  CO2 27   < > 26  --   --  29* 27*  GLUCOSE 91  --  109*  --  114*  --   --   BUN 15   < > 12   < > 18 13 26*  CREATININE 0.96   < > 0.80   < > 0.60 0.7 1.0  CALCIUM 8.0*   < > 7.8*  --   --  8.7 8.4*   < > = values in this interval not displayed.   Liver Function Tests: Recent Labs    04/26/19 0414 06/16/19 0000 07/24/19 0000  AST 33 24 22  ALT 23 25 24   ALKPHOS 59 89 84  BILITOT 0.6  --   --  PROT 4.5*  --   --   ALBUMIN 2.6* 3.6 3.9   No results for input(s): LIPASE, AMYLASE in the last 8760 hours. No results for input(s): AMMONIA in the last 8760 hours. CBC: Recent Labs    04/20/19 1122 04/23/19 0000 04/26/19 0414 04/26/19 0414 05/30/19 1158 06/16/19 0000 07/24/19 0000  WBC 8.1   < > 5.5  --   --  6.1 9.5  NEUTROABS 7.1   < > 4.1  --   --  3,892 7,334  HGB 14.0   < > 15.3*   < > 14.3 12.7 12.8  HCT 43.5   < > 47.5*   < > 42.0 39 39  MCV 98.2  --  99.8  --   --   --   --   PLT 322   < > 226  --   --  257 232   < > = values in this interval not displayed.   Lipid Panel: No results for input(s): CHOL, HDL, LDLCALC, TRIG, CHOLHDL, LDLDIRECT in the last 8760 hours. No results found for: HGBA1C  Procedures since last visit: No results found.  Assessment/Plan Lower Extremity Edema  Has gained almost 10 lbs Leaking from the wound in her Right leg Will discontinue Lasix and start her on Demadex 20 mg QD with Potassium 20 meq QD Repeat BMP in 1 week Sking tear in right leg It is draining  Continue the Hydrogel Dressing Does not need Antibiiotics Diuresis and decreasing the swelling witll help PSVT Continues to have Episodes of Rapid HR Refuses to go to ED On Lopressor  Gave her Extra dose today which did not  help Will Make Cardiology Consult if patient agrees GERD Will decrase the Prilosec to QD Diarrhea Stool has been negative for C Diff She uses Lomotil PRN has been using this for many years  Labs/tests ordered:  * No order type specified * Next appt:  Visit date not found  Total time spent in this patient care encounter was  40_  minutes; greater than 50% of the visit spent counseling patient and staff, reviewing records , Labs and coordinating care for problems addressed at this encounter.

## 2019-09-29 LAB — BASIC METABOLIC PANEL
BUN: 32 — AB (ref 4–21)
CO2: 32 — AB (ref 13–22)
Chloride: 101 (ref 99–108)
Creatinine: 1 (ref 0.5–1.1)
Glucose: 82
Potassium: 3.8 (ref 3.4–5.3)
Sodium: 144 (ref 137–147)

## 2019-09-29 LAB — COMPREHENSIVE METABOLIC PANEL: Calcium: 8.9 (ref 8.7–10.7)

## 2019-09-30 ENCOUNTER — Encounter: Payer: Self-pay | Admitting: Nurse Practitioner

## 2019-09-30 DIAGNOSIS — N183 Chronic kidney disease, stage 3 unspecified: Secondary | ICD-10-CM | POA: Insufficient documentation

## 2019-10-05 ENCOUNTER — Encounter (INDEPENDENT_AMBULATORY_CARE_PROVIDER_SITE_OTHER): Payer: Medicare Other | Admitting: Ophthalmology

## 2019-10-07 ENCOUNTER — Other Ambulatory Visit: Payer: Self-pay

## 2019-10-07 ENCOUNTER — Encounter (INDEPENDENT_AMBULATORY_CARE_PROVIDER_SITE_OTHER): Payer: Medicare Other | Admitting: Ophthalmology

## 2019-10-07 DIAGNOSIS — H353231 Exudative age-related macular degeneration, bilateral, with active choroidal neovascularization: Secondary | ICD-10-CM | POA: Diagnosis not present

## 2019-10-07 DIAGNOSIS — H43813 Vitreous degeneration, bilateral: Secondary | ICD-10-CM

## 2019-10-20 DIAGNOSIS — L570 Actinic keratosis: Secondary | ICD-10-CM | POA: Diagnosis not present

## 2019-10-20 DIAGNOSIS — L821 Other seborrheic keratosis: Secondary | ICD-10-CM | POA: Diagnosis not present

## 2019-10-20 DIAGNOSIS — L814 Other melanin hyperpigmentation: Secondary | ICD-10-CM | POA: Diagnosis not present

## 2019-10-20 DIAGNOSIS — L84 Corns and callosities: Secondary | ICD-10-CM | POA: Diagnosis not present

## 2019-10-20 DIAGNOSIS — D225 Melanocytic nevi of trunk: Secondary | ICD-10-CM | POA: Diagnosis not present

## 2019-10-25 DIAGNOSIS — R309 Painful micturition, unspecified: Secondary | ICD-10-CM | POA: Diagnosis not present

## 2019-10-25 DIAGNOSIS — R509 Fever, unspecified: Secondary | ICD-10-CM | POA: Diagnosis not present

## 2019-10-25 DIAGNOSIS — I10 Essential (primary) hypertension: Secondary | ICD-10-CM | POA: Diagnosis not present

## 2019-10-26 ENCOUNTER — Encounter: Payer: Self-pay | Admitting: Nurse Practitioner

## 2019-10-26 ENCOUNTER — Non-Acute Institutional Stay: Payer: Medicare Other | Admitting: Nurse Practitioner

## 2019-10-26 DIAGNOSIS — F0390 Unspecified dementia without behavioral disturbance: Secondary | ICD-10-CM | POA: Insufficient documentation

## 2019-10-26 DIAGNOSIS — F0150 Vascular dementia without behavioral disturbance: Secondary | ICD-10-CM | POA: Insufficient documentation

## 2019-10-26 DIAGNOSIS — R413 Other amnesia: Secondary | ICD-10-CM

## 2019-10-26 DIAGNOSIS — I471 Supraventricular tachycardia: Secondary | ICD-10-CM | POA: Diagnosis not present

## 2019-10-26 DIAGNOSIS — K219 Gastro-esophageal reflux disease without esophagitis: Secondary | ICD-10-CM | POA: Diagnosis not present

## 2019-10-26 DIAGNOSIS — R609 Edema, unspecified: Secondary | ICD-10-CM

## 2019-10-26 DIAGNOSIS — I10 Essential (primary) hypertension: Secondary | ICD-10-CM | POA: Diagnosis not present

## 2019-10-26 LAB — CBC: RBC: 4.52 (ref 3.87–5.11)

## 2019-10-26 LAB — HEPATIC FUNCTION PANEL
ALT: 15 (ref 7–35)
AST: 31 (ref 13–35)
Alkaline Phosphatase: 77 (ref 25–125)
Bilirubin, Total: 0.6

## 2019-10-26 LAB — CBC AND DIFFERENTIAL
HCT: 42 (ref 36–46)
Hemoglobin: 13.9 (ref 12.0–16.0)
Neutrophils Absolute: 5936
Platelets: 251 (ref 150–399)
WBC: 7.8

## 2019-10-26 LAB — BASIC METABOLIC PANEL
BUN: 24 — AB (ref 4–21)
CO2: 27 — AB (ref 13–22)
Chloride: 103 (ref 99–108)
Creatinine: 0.9 (ref 0.5–1.1)
Glucose: 90
Potassium: 4.8 (ref 3.4–5.3)
Sodium: 139 (ref 137–147)

## 2019-10-26 LAB — COMPREHENSIVE METABOLIC PANEL
Albumin: 3.8 (ref 3.5–5.0)
Calcium: 8.9 (ref 8.7–10.7)
Globulin: 2.3

## 2019-10-26 NOTE — Assessment & Plan Note (Addendum)
The patient stated I lost my memory, but no focal weakness, pending UA C/S, will CBC/diff, CMP/eGFR stat, will update MMSE, last MMSE 27/30 05/2019 10/26/19 wbc 7.8, Hgb 13.9, plt 251, neutrophils 76.1, Na 139, K 4.8, Bun 24, creat 0.85, eGFR 58 10/28/19 MMSE 20/30, no clock drawl, start Memantine 25m qd. HPOA agreed with neurology consultation, ED eval if HPOA desires

## 2019-10-26 NOTE — Progress Notes (Addendum)
Location:  [facility] Room Number: 945 Place of Service:  ALF (13) Provider: Marlana Latus NP  Kalin Kyler X, NP  Patient Care Team: Karas Pickerill X, NP as PCP - General (Internal Medicine)  Extended Emergency Contact Information Primary Emergency Contact: Bachar,TROY Address: [place],  [place] Home Phone: [phone] Relation: None Secondary Emergency Contact: [name] Mobile Phone: [phone] Relation: Friend  Code Status: DNR Goals of care: Advanced Directive information Advanced Directives 09/22/2019  Does Patient Have a Medical Advance Directive? Yes  Type of Advance Directive Living will;Out of facility DNR (pink MOST or yellow form)  Does patient want to make changes to medical advance directive? No - Patient declined  Copy of [place] in Chart? -  Pre-existing out of facility DNR order (yellow form or pink MOST form) Yellow form placed in chart (order not valid for inpatient use)     Chief Complaint  Patient presents with  . Acute Visit    confusion    HPI:  [name] seen today for an acute visit for the patient stated I lost my memory, but no focal weakness, pending UA C/S, will CBC/diff, CMP/eGFR stat, will update MMSE, last MMSE 27/30 05/2019  Hx of SVT,  asymptomatic, on Metoprolol 105m bid.   BLE edema, minimal, takes Torsemide 21mqd.   GERD, no complaints today, on Omeprazole bid, prn Imodium    Past Medical History:  Diagnosis Date  . COVID-19   . 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 No pertinent surgical history.  Allergies  Allergen Reactions  . Azopt [Brinzolamide]   . Brimonidine   . Lumigan [Bimatoprost]     Allergies as of 10/26/2019      Reactions   Azopt [brinzolamide]    Brimonidine    Lumigan [bimatoprost]       Medication List       Accurate as of October 26, 2019 11:59 PM. If you have any questions, ask your nurse or doctor.        STOP taking these medications   furosemide 20 MG tablet Commonly known as: LASIX Stopped by: Hughey Rittenberry X Ausha Sieh, NP   senna 8.6 MG Tabs tablet Commonly known as: SENOKOT Stopped by: Curtez Brallier X Taia Bramlett, NP     TAKE these medications   b complex vitamins tablet Take 1 tablet by mouth as needed.   bismuth subsalicylate [id number] Commonly known as: PEPTO BISMOL Take 60 mLs by mouth daily as needed.   Calcium Carbonate-Vitamin D 600-400 MG-UNIT tablet Take 1 tablet by mouth daily.   CENTRUM SILVER PO Take 1 tablet by mouth daily. 7am-10am.   diphenoxylate-atropine 2.5-0.025 MG/5ML liquid Commonly known as: LOMOTIL Take by mouth 3 (three) times daily as needed for diarrhea or loose stools.   melatonin 3 MG Tabs tablet Take 3 mg by mouth at bedtime as needed.   metoprolol tartrate 25 MG tablet Commonly known as: LOPRESSOR Take 25 mg by mouth 2 (two) times daily.   metoprolol tartrate 25 MG tablet Commonly known as: LOPRESSOR Take 12.5 mg by mouth once.   NON FORMULARY Moisturizing Cream to extremities with morning and evening care Every Shift   NON FORMULARY Regular Special Instructions: Regular Diet, Thin Liquids   omeprazole 20 MG capsule Commonly known as: PRILOSEC Take 20 mg by mouth daily.  potassium chloride SA 20 MEQ tablet Commonly known as: KLOR-CON Take 20 mEq by mouth daily.   timolol 0.5 % ophthalmic solution Commonly known as: BETIMOL Place 1 drop into both eyes daily. 7am-10am.   torsemide 20 MG tablet Commonly known as: DEMADEX Take 20 mg by mouth daily.       Review of Systems  Constitutional: Negative for appetite change, fatigue and fever.  HENT: Positive for hearing loss and trouble swallowing. Negative for congestion and voice change.   Eyes: Negative for visual disturbance.  Respiratory: Negative for cough and shortness of breath.   Cardiovascular:  Positive for leg swelling. Negative for palpitations.       RLE  Gastrointestinal: Negative for abdominal pain and constipation.  Genitourinary: Positive for frequency. Negative for difficulty urinating, dysuria and urgency.  Musculoskeletal: Positive for gait problem.  Skin: Negative for color change.  Neurological: Negative for speech difficulty, weakness, light-headedness and headaches.  Psychiatric/Behavioral: Positive for confusion. Negative for behavioral problems and sleep disturbance.    Immunization History  Administered Date(s) Administered  . Influenza, High Dose Seasonal PF 11/08/2018  . Influenza-Unspecified 11/07/2013  . Moderna SARS-COVID-2 Vaccination 06/06/2019   Pertinent  Health Maintenance Due  Topic Date Due  . PNA vac Low Risk Adult (1 of 2 - PCV13) Never done  . INFLUENZA VACCINE  11/08/2019  . DEXA SCAN  Completed   Fall Risk  05/19/2019 05/14/2019  Falls in the past year? 0 0  Number falls in past yr: 0 0  Injury with Fall? 0 -   Functional Status Survey:    Vitals:   10/26/19 1104  BP: (!) 142/68  Pulse: (!) 58  Resp: 19  Temp: (!) 97.5 F (36.4 C)  SpO2: 98%  Weight: 88 lb (39.9 kg)  Height: _0  (1.346 m)   Body mass index is 22.03 kg/m. Physical Exam Vitals and nursing note reviewed.  Constitutional:      General: She is not in acute distress.    Appearance: Normal appearance. She is not ill-appearing.  HENT:     Head: Normocephalic and atraumatic.     Mouth/Throat:     Mouth: Mucous membranes are moist.  Eyes:     Extraocular Movements: Extraocular movements intact.     Conjunctiva/sclera: Conjunctivae normal.     Pupils: Pupils are equal, round, and reactive to light.  Cardiovascular:     Rate and Rhythm: Normal rate and regular rhythm.     Heart sounds: No murmur heard.   Pulmonary:     Breath sounds: No wheezing or rales.  Abdominal:     General: Bowel sounds are normal.     Palpations: Abdomen is soft.     Tenderness:  There is no abdominal tenderness. There is no right CVA tenderness, left CVA tenderness, guarding or rebound.  Musculoskeletal:     Cervical back: Normal range of motion and neck supple.     Right lower leg: No edema.     Left lower leg: No edema.  Skin:    General: Skin is warm and dry.  Neurological:     General: No focal deficit present.     Mental Status: She is alert and oriented to person, place, and time. Mental status is at baseline.     Motor: No weakness.     Coordination: Coordination normal.     Gait: Gait abnormal.  Psychiatric:     Comments: Keeps saying I loss my memory.      Labs reviewed:  Recent Labs    04/20/19 1122 04/23/19 0000 04/26/19 0414 04/26/19 0414 05/30/19 1158 06/16/19 0000 07/24/19 0000 09/29/19 0000 10/26/19 0000  NA 143   < > 141   < > 142   < > 138 144 139  K 4.0   < > 4.1   < > 3.3*   < > 3.6 3.8 4.8  CL 105   < > 106   < > 109   < > 105 101 103  CO2 27   < > 26  --   --    < > 27* 32* 27*  GLUCOSE 91  --  109*  --  114*  --   --   --   --   BUN 15   < > 12   < > 18   < > 26* 32* 24*  CREATININE 0.96   < > 0.80   < > 0.60   < > 1.0 1.0 0.9  CALCIUM 8.0*   < > 7.8*  --   --    < > 8.4* 8.9 8.9   < > = values in this interval not displayed.   Recent Labs    04/26/19 0414 04/26/19 0414 06/16/19 0000 07/24/19 0000 10/26/19 0000  AST 33   < > _0 ALT 23   < > _1 ALKPHOS 59   < > 89 84 77  BILITOT 0.6  --   --   --   --   PROT 4.5*  --   --   --   --   ALBUMIN 2.6*   < > 3.6 3.9 3.8   < > = values in this interval not displayed.   Recent Labs    04/20/19 1122 04/23/19 0000 04/26/19 0414 05/30/19 1158 06/16/19 0000 07/24/19 0000 10/26/19 0000  WBC 8.1   < > 5.5  --  6.1 9.5 7.8  NEUTROABS 7.1   < > 4.1  --  3,892 7,334 5,936  HGB 14.0   < > 15.3*   < > 12.7 12.8 13.9  HCT 43.5   < > 47.5*   < > 39 39 42  MCV 98.2  --  99.8  --   --   --   --   PLT 322   < > 226  --  257 232 251   < > = values in this  interval not displayed.   No results found for: TSH No results found for: HGBA1C No results found for: CHOL, HDL, LDLCALC, LDLDIRECT, TRIG, CHOLHDL  Significant Diagnostic Results in last 30 days:  No results found.  Assessment/Plan: Memory deficit The patient stated I lost my memory, but no focal weakness, pending UA C/S, will CBC/diff, CMP/eGFR stat, will update MMSE, last MMSE 27/30 05/2019 10/26/19 wbc 7.8, Hgb 13.9, plt 251, neutrophils 76.1, Na 139, K 4.8, Bun 24, creat 0.85, eGFR 58 10/28/19 MMSE 20/30, no clock drawl, start Memantine 56m qd. HPOA agreed with neurology consultation, ED eval if HPOA desires    Paroxysmal SVT (supraventricular tachycardia) (HCC) Heart rate is in control, continue Metoprolol.   GERD (gastroesophageal reflux disease) Stable, continue Omeprazole.   Edema Minimal, continue Torsemide.     Family/ staff Communication: plan of care reviewed with the patient and charge nurse.   Labs/tests ordered:  CBC/diff, CMP/eGFR stat, pending UA C/S ordered per on call  Time spend 40 minutes.

## 2019-10-27 ENCOUNTER — Encounter: Payer: Self-pay | Admitting: Nurse Practitioner

## 2019-10-27 NOTE — Assessment & Plan Note (Signed)
Stable, continue Omeprazole.  

## 2019-10-27 NOTE — Assessment & Plan Note (Signed)
Heart rate is in control, continue Metoprolol  

## 2019-10-27 NOTE — Assessment & Plan Note (Signed)
Minimal, continue Torsemide.

## 2019-11-02 ENCOUNTER — Encounter (HOSPITAL_COMMUNITY): Payer: Self-pay | Admitting: Emergency Medicine

## 2019-11-02 ENCOUNTER — Emergency Department (HOSPITAL_COMMUNITY): Payer: Medicare Other

## 2019-11-02 ENCOUNTER — Inpatient Hospital Stay (HOSPITAL_COMMUNITY)
Admission: EM | Admit: 2019-11-02 | Discharge: 2019-11-12 | DRG: 689 | Disposition: A | Discharge status: Skilled Nursing Facility | Payer: Medicare Other | Source: Skilled Nursing Facility | Attending: Family Medicine | Admitting: Family Medicine

## 2019-11-02 ENCOUNTER — Other Ambulatory Visit: Payer: Self-pay

## 2019-11-02 DIAGNOSIS — R000 Tachycardia, unspecified: Secondary | ICD-10-CM | POA: Diagnosis not present

## 2019-11-02 DIAGNOSIS — M79671 Pain in right foot: Secondary | ICD-10-CM | POA: Diagnosis present

## 2019-11-02 DIAGNOSIS — K529 Noninfective gastroenteritis and colitis, unspecified: Secondary | ICD-10-CM | POA: Diagnosis present

## 2019-11-02 DIAGNOSIS — E860 Dehydration: Secondary | ICD-10-CM | POA: Diagnosis present

## 2019-11-02 DIAGNOSIS — N1831 Chronic kidney disease, stage 3a: Secondary | ICD-10-CM | POA: Diagnosis present

## 2019-11-02 DIAGNOSIS — F419 Anxiety disorder, unspecified: Secondary | ICD-10-CM | POA: Diagnosis present

## 2019-11-02 DIAGNOSIS — N390 Urinary tract infection, site not specified: Secondary | ICD-10-CM

## 2019-11-02 DIAGNOSIS — E1122 Type 2 diabetes mellitus with diabetic chronic kidney disease: Secondary | ICD-10-CM | POA: Diagnosis present

## 2019-11-02 DIAGNOSIS — R001 Bradycardia, unspecified: Secondary | ICD-10-CM | POA: Diagnosis not present

## 2019-11-02 DIAGNOSIS — I639 Cerebral infarction, unspecified: Secondary | ICD-10-CM | POA: Diagnosis not present

## 2019-11-02 DIAGNOSIS — E1151 Type 2 diabetes mellitus with diabetic peripheral angiopathy without gangrene: Secondary | ICD-10-CM | POA: Diagnosis present

## 2019-11-02 DIAGNOSIS — J90 Pleural effusion, not elsewhere classified: Secondary | ICD-10-CM | POA: Diagnosis not present

## 2019-11-02 DIAGNOSIS — N179 Acute kidney failure, unspecified: Secondary | ICD-10-CM | POA: Diagnosis not present

## 2019-11-02 DIAGNOSIS — I5032 Chronic diastolic (congestive) heart failure: Secondary | ICD-10-CM | POA: Diagnosis not present

## 2019-11-02 DIAGNOSIS — R410 Disorientation, unspecified: Secondary | ICD-10-CM | POA: Diagnosis not present

## 2019-11-02 DIAGNOSIS — Z79899 Other long term (current) drug therapy: Secondary | ICD-10-CM

## 2019-11-02 DIAGNOSIS — G9341 Metabolic encephalopathy: Secondary | ICD-10-CM | POA: Diagnosis not present

## 2019-11-02 DIAGNOSIS — Z20822 Contact with and (suspected) exposure to covid-19: Secondary | ICD-10-CM | POA: Diagnosis present

## 2019-11-02 DIAGNOSIS — I959 Hypotension, unspecified: Secondary | ICD-10-CM | POA: Diagnosis not present

## 2019-11-02 DIAGNOSIS — I9589 Other hypotension: Secondary | ICD-10-CM | POA: Diagnosis not present

## 2019-11-02 DIAGNOSIS — K219 Gastro-esophageal reflux disease without esophagitis: Secondary | ICD-10-CM | POA: Diagnosis present

## 2019-11-02 DIAGNOSIS — I471 Supraventricular tachycardia, unspecified: Secondary | ICD-10-CM | POA: Diagnosis present

## 2019-11-02 DIAGNOSIS — I6381 Other cerebral infarction due to occlusion or stenosis of small artery: Secondary | ICD-10-CM | POA: Diagnosis not present

## 2019-11-02 DIAGNOSIS — N183 Chronic kidney disease, stage 3 unspecified: Secondary | ICD-10-CM | POA: Diagnosis not present

## 2019-11-02 DIAGNOSIS — Z23 Encounter for immunization: Secondary | ICD-10-CM | POA: Diagnosis not present

## 2019-11-02 DIAGNOSIS — F329 Major depressive disorder, single episode, unspecified: Secondary | ICD-10-CM | POA: Diagnosis present

## 2019-11-02 DIAGNOSIS — R32 Unspecified urinary incontinence: Secondary | ICD-10-CM | POA: Diagnosis not present

## 2019-11-02 DIAGNOSIS — F4323 Adjustment disorder with mixed anxiety and depressed mood: Secondary | ICD-10-CM | POA: Diagnosis present

## 2019-11-02 DIAGNOSIS — M2550 Pain in unspecified joint: Secondary | ICD-10-CM | POA: Diagnosis not present

## 2019-11-02 DIAGNOSIS — F339 Major depressive disorder, recurrent, unspecified: Secondary | ICD-10-CM | POA: Diagnosis not present

## 2019-11-02 DIAGNOSIS — R55 Syncope and collapse: Secondary | ICD-10-CM | POA: Diagnosis not present

## 2019-11-02 DIAGNOSIS — W010XXD Fall on same level from slipping, tripping and stumbling without subsequent striking against object, subsequent encounter: Secondary | ICD-10-CM | POA: Diagnosis not present

## 2019-11-02 DIAGNOSIS — I6789 Other cerebrovascular disease: Secondary | ICD-10-CM | POA: Diagnosis not present

## 2019-11-02 DIAGNOSIS — H353220 Exudative age-related macular degeneration, left eye, stage unspecified: Secondary | ICD-10-CM | POA: Diagnosis not present

## 2019-11-02 DIAGNOSIS — R4182 Altered mental status, unspecified: Secondary | ICD-10-CM | POA: Diagnosis present

## 2019-11-02 DIAGNOSIS — I129 Hypertensive chronic kidney disease with stage 1 through stage 4 chronic kidney disease, or unspecified chronic kidney disease: Secondary | ICD-10-CM | POA: Diagnosis not present

## 2019-11-02 DIAGNOSIS — Z66 Do not resuscitate: Secondary | ICD-10-CM | POA: Diagnosis present

## 2019-11-02 DIAGNOSIS — Z599 Problem related to housing and economic circumstances, unspecified: Secondary | ICD-10-CM

## 2019-11-02 DIAGNOSIS — F0390 Unspecified dementia without behavioral disturbance: Secondary | ICD-10-CM | POA: Diagnosis present

## 2019-11-02 DIAGNOSIS — I484 Atypical atrial flutter: Secondary | ICD-10-CM | POA: Diagnosis present

## 2019-11-02 DIAGNOSIS — U071 COVID-19: Secondary | ICD-10-CM | POA: Diagnosis not present

## 2019-11-02 DIAGNOSIS — M79673 Pain in unspecified foot: Secondary | ICD-10-CM

## 2019-11-02 DIAGNOSIS — E861 Hypovolemia: Secondary | ICD-10-CM | POA: Diagnosis not present

## 2019-11-02 DIAGNOSIS — R45851 Suicidal ideations: Secondary | ICD-10-CM | POA: Diagnosis present

## 2019-11-02 DIAGNOSIS — T447X5A Adverse effect of beta-adrenoreceptor antagonists, initial encounter: Secondary | ICD-10-CM | POA: Diagnosis not present

## 2019-11-02 DIAGNOSIS — Z515 Encounter for palliative care: Secondary | ICD-10-CM | POA: Diagnosis not present

## 2019-11-02 DIAGNOSIS — R29818 Other symptoms and signs involving the nervous system: Secondary | ICD-10-CM | POA: Diagnosis not present

## 2019-11-02 DIAGNOSIS — I6389 Other cerebral infarction: Secondary | ICD-10-CM | POA: Diagnosis not present

## 2019-11-02 DIAGNOSIS — H40009 Preglaucoma, unspecified, unspecified eye: Secondary | ICD-10-CM | POA: Diagnosis not present

## 2019-11-02 DIAGNOSIS — Z7189 Other specified counseling: Secondary | ICD-10-CM | POA: Diagnosis not present

## 2019-11-02 DIAGNOSIS — R404 Transient alteration of awareness: Secondary | ICD-10-CM | POA: Diagnosis not present

## 2019-11-02 DIAGNOSIS — Z7401 Bed confinement status: Secondary | ICD-10-CM | POA: Diagnosis not present

## 2019-11-02 DIAGNOSIS — G319 Degenerative disease of nervous system, unspecified: Secondary | ICD-10-CM | POA: Diagnosis not present

## 2019-11-02 DIAGNOSIS — F29 Unspecified psychosis not due to a substance or known physiological condition: Secondary | ICD-10-CM | POA: Diagnosis not present

## 2019-11-02 DIAGNOSIS — Z888 Allergy status to other drugs, medicaments and biological substances status: Secondary | ICD-10-CM

## 2019-11-02 DIAGNOSIS — B9620 Unspecified Escherichia coli [E. coli] as the cause of diseases classified elsewhere: Secondary | ICD-10-CM | POA: Diagnosis present

## 2019-11-02 DIAGNOSIS — G9389 Other specified disorders of brain: Secondary | ICD-10-CM | POA: Diagnosis not present

## 2019-11-02 DIAGNOSIS — I130 Hypertensive heart and chronic kidney disease with heart failure and stage 1 through stage 4 chronic kidney disease, or unspecified chronic kidney disease: Secondary | ICD-10-CM | POA: Diagnosis not present

## 2019-11-02 DIAGNOSIS — N3000 Acute cystitis without hematuria: Secondary | ICD-10-CM | POA: Diagnosis not present

## 2019-11-02 DIAGNOSIS — J1282 Pneumonia due to coronavirus disease 2019: Secondary | ICD-10-CM | POA: Diagnosis not present

## 2019-11-02 DIAGNOSIS — Z1611 Resistance to penicillins: Secondary | ICD-10-CM | POA: Diagnosis present

## 2019-11-02 DIAGNOSIS — R9431 Abnormal electrocardiogram [ECG] [EKG]: Secondary | ICD-10-CM | POA: Diagnosis not present

## 2019-11-02 DIAGNOSIS — H3530 Unspecified macular degeneration: Secondary | ICD-10-CM | POA: Diagnosis present

## 2019-11-02 DIAGNOSIS — R29898 Other symptoms and signs involving the musculoskeletal system: Secondary | ICD-10-CM | POA: Diagnosis not present

## 2019-11-02 DIAGNOSIS — I63432 Cerebral infarction due to embolism of left posterior cerebral artery: Secondary | ICD-10-CM

## 2019-11-02 DIAGNOSIS — R609 Edema, unspecified: Secondary | ICD-10-CM | POA: Diagnosis not present

## 2019-11-02 DIAGNOSIS — R2681 Unsteadiness on feet: Secondary | ICD-10-CM | POA: Diagnosis not present

## 2019-11-02 DIAGNOSIS — I69318 Other symptoms and signs involving cognitive functions following cerebral infarction: Secondary | ICD-10-CM | POA: Diagnosis not present

## 2019-11-02 DIAGNOSIS — M19071 Primary osteoarthritis, right ankle and foot: Secondary | ICD-10-CM | POA: Diagnosis present

## 2019-11-02 DIAGNOSIS — M6281 Muscle weakness (generalized): Secondary | ICD-10-CM | POA: Diagnosis not present

## 2019-11-02 DIAGNOSIS — J439 Emphysema, unspecified: Secondary | ICD-10-CM | POA: Diagnosis present

## 2019-11-02 DIAGNOSIS — R9082 White matter disease, unspecified: Secondary | ICD-10-CM | POA: Diagnosis not present

## 2019-11-02 LAB — URINALYSIS, ROUTINE W REFLEX MICROSCOPIC
Bilirubin Urine: NEGATIVE
Glucose, UA: NEGATIVE mg/dL
Hgb urine dipstick: NEGATIVE
Ketones, ur: NEGATIVE mg/dL
Nitrite: NEGATIVE
Protein, ur: NEGATIVE mg/dL
Specific Gravity, Urine: 1.008 (ref 1.005–1.030)
pH: 5 (ref 5.0–8.0)

## 2019-11-02 LAB — CBC
HCT: 48.9 % — ABNORMAL HIGH (ref 36.0–46.0)
Hemoglobin: 15.6 g/dL — ABNORMAL HIGH (ref 12.0–15.0)
MCH: 30.2 pg (ref 26.0–34.0)
MCHC: 31.9 g/dL (ref 30.0–36.0)
MCV: 94.6 fL (ref 80.0–100.0)
Platelets: 250 10*3/uL (ref 150–400)
RBC: 5.17 MIL/uL — ABNORMAL HIGH (ref 3.87–5.11)
RDW: 15.1 % (ref 11.5–15.5)
WBC: 8.6 10*3/uL (ref 4.0–10.5)
nRBC: 0 % (ref 0.0–0.2)

## 2019-11-02 LAB — HEPATIC FUNCTION PANEL
ALT: 19 U/L (ref 0–44)
AST: 34 U/L (ref 15–41)
Albumin: 4 g/dL (ref 3.5–5.0)
Alkaline Phosphatase: 64 U/L (ref 38–126)
Bilirubin, Direct: 0.1 mg/dL (ref 0.0–0.2)
Indirect Bilirubin: 0.7 mg/dL (ref 0.3–0.9)
Total Bilirubin: 0.8 mg/dL (ref 0.3–1.2)
Total Protein: 6.9 g/dL (ref 6.5–8.1)

## 2019-11-02 LAB — BASIC METABOLIC PANEL
Anion gap: 15 (ref 5–15)
BUN: 33 mg/dL — ABNORMAL HIGH (ref 8–23)
CO2: 28 mmol/L (ref 22–32)
Calcium: 9.4 mg/dL (ref 8.9–10.3)
Chloride: 96 mmol/L — ABNORMAL LOW (ref 98–111)
Creatinine, Ser: 1.14 mg/dL — ABNORMAL HIGH (ref 0.44–1.00)
GFR calc Af Amer: 47 mL/min — ABNORMAL LOW (ref >60)
GFR calc non Af Amer: 41 mL/min — ABNORMAL LOW (ref >60)
Glucose, Bld: 99 mg/dL (ref 70–99)
Potassium: 4.5 mmol/L (ref 3.5–5.1)
Sodium: 139 mmol/L (ref 135–145)

## 2019-11-02 LAB — TSH: TSH: 1.629 u[IU]/mL (ref 0.350–4.500)

## 2019-11-02 LAB — AMMONIA: Ammonia: 11 umol/L (ref 9–35)

## 2019-11-02 LAB — SARS CORONAVIRUS 2 BY RT PCR (HOSPITAL ORDER, PERFORMED IN ~~LOC~~ HOSPITAL LAB): SARS Coronavirus 2: NEGATIVE

## 2019-11-02 MED ORDER — MELATONIN 3 MG PO TABS
3.0000 mg | ORAL_TABLET | Freq: Every day | ORAL | Status: DC
  Administered 2019-11-02 – 2019-11-11 (×10): 3 mg via ORAL
  Filled 2019-11-02 (×10): qty 1

## 2019-11-02 MED ORDER — HYDRALAZINE HCL 20 MG/ML IJ SOLN: 5.0000 mg | Freq: Four times a day (QID) | INTRAMUSCULAR | Status: DC | PRN

## 2019-11-02 MED ORDER — METOPROLOL TARTRATE 5 MG/5ML IV SOLN
5.0000 mg | INTRAVENOUS | Status: DC | PRN
  Administered 2019-11-03 – 2019-11-06 (×5): 5 mg via INTRAVENOUS
  Filled 2019-11-02 (×5): qty 5

## 2019-11-02 MED ORDER — HEPARIN SODIUM (PORCINE) 5000 UNIT/ML IJ SOLN
5000.0000 [IU] | Freq: Three times a day (TID) | INTRAMUSCULAR | Status: DC
  Administered 2019-11-02 – 2019-11-06 (×11): 5000 [IU] via SUBCUTANEOUS
  Filled 2019-11-02 (×11): qty 1

## 2019-11-02 MED ORDER — ACETAMINOPHEN 325 MG PO TABS
650.0000 mg | ORAL_TABLET | Freq: Four times a day (QID) | ORAL | Status: DC | PRN
  Administered 2019-11-03 – 2019-11-11 (×8): 650 mg via ORAL
  Filled 2019-11-02 (×8): qty 2

## 2019-11-02 MED ORDER — PANTOPRAZOLE SODIUM 40 MG PO TBEC
40.0000 mg | DELAYED_RELEASE_TABLET | Freq: Every day | ORAL | Status: DC
  Administered 2019-11-02 – 2019-11-12 (×11): 40 mg via ORAL
  Filled 2019-11-02 (×11): qty 1

## 2019-11-02 MED ORDER — DIPHENOXYLATE-ATROPINE 2.5-0.025 MG/5ML PO LIQD
5.0000 mL | Freq: Three times a day (TID) | ORAL | Status: DC | PRN
  Administered 2019-11-04 – 2019-11-12 (×3): 5 mL via ORAL
  Filled 2019-11-02 (×5): qty 5

## 2019-11-02 MED ORDER — SODIUM CHLORIDE 0.9% FLUSH
3.0000 mL | Freq: Once | INTRAVENOUS | Status: AC
  Administered 2019-11-02: 3 mL via INTRAVENOUS

## 2019-11-02 MED ORDER — SODIUM CHLORIDE 0.9 % IV SOLN: INTRAVENOUS | Status: AC

## 2019-11-02 MED ORDER — TIMOLOL MALEATE 0.5 % OP SOLN
1.0000 [drp] | Freq: Every day | OPHTHALMIC | Status: DC
  Administered 2019-11-03 – 2019-11-12 (×10): 1 [drp] via OPHTHALMIC
  Filled 2019-11-02: qty 5

## 2019-11-02 MED ORDER — ACETAMINOPHEN 650 MG RE SUPP: 650.0000 mg | Freq: Four times a day (QID) | RECTAL | Status: DC | PRN

## 2019-11-02 MED ORDER — SODIUM CHLORIDE 0.9 % IV SOLN
1.0000 g | INTRAVENOUS | Status: AC
  Administered 2019-11-03 – 2019-11-04 (×2): 1 g via INTRAVENOUS
  Filled 2019-11-02 (×2): qty 1

## 2019-11-02 MED ORDER — SODIUM CHLORIDE 0.9 % IV SOLN
1.0000 g | Freq: Once | INTRAVENOUS | Status: AC
  Administered 2019-11-02: 1 g via INTRAVENOUS
  Filled 2019-11-02: qty 10

## 2019-11-02 MED ORDER — BISMUTH SUBSALICYLATE 262 MG/15ML PO SUSP
60.0000 mL | Freq: Every day | ORAL | Status: DC | PRN
  Filled 2019-11-02: qty 236

## 2019-11-02 NOTE — H&P (Signed)
History and Physical        Hospital Admission Note Date: 11/02/2019  Patient name: Bianca [name] Medical record number: [id number] Date of birth: May 06, 1922 Age: 86 y.o. Gender: female  PCP: [name], [name], NP  Patient coming from: [place]    Chief Complaint    Chief Complaint  Patient presents with  . Weakness      HPI:   Patient's history obtained form ED physician's note as she is a poor historian given her altered mental status    Female with past medical history of emphysema, CKD, paroxysmal SVT who was recently started on Namenda by her nursing facility.  Staff at the nursing facility for the patient to be more fatigued and tired and less active than normal.  She is apparently normally alert and oriented x4 and is very independent.  Patient reported to the ED physician that she had chronic diarrhea has a history and has been having some urinary urgency and hesitancy without dysuria.  She was oriented x3 on the ED physicians exam.  According to previous note, the facility was concerned the patient may be having another cause of her altered mental status but reported she recently started the [place].   Upon my interview with patient, she denies any other symptoms of chest pain, nausea, vomiting.  She only stated that she had urinary frequency multiple times.  ED Course: Bradycardic 40s, labile BPs increased to 170/56, UA with leukocytes, WBC and rare bacteria.  She was started on ceftriaxone. Cultures were obtained.  CT head showed progression of atrophy and chronic microvascular ischemic changes but no acute changes.  Initially cardiology consult was requested however upon further evaluation this was canceled as her heart rate had improved at the bedside  Vitals:   11/02/19 1457 11/02/19 1710  BP: (!) 156/59 (!) 170/56  Pulse: 54 54  Resp: (!) 29 15  Temp:     SpO2: 100% 100%     Review of Systems:  Review of Systems  Unable to perform ROS: Mental acuity (Only reports her urinary symptoms and is not provide much other history)  Genitourinary: Positive for frequency and urgency.    Medical/Social/Family History   Past Medical History: Past Medical History:  Diagnosis Date  . COVID-19   . 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 No pertinent surgical history.  Medications: Prior to Admission medications   Medication Sig Start Date End Date Taking? Authorizing Provider  acetaminophen (TYLENOL) 325 MG tablet Take 650 mg by mouth every 4 (four) hours as needed for mild pain or headache.   Yes [provider]  b complex vitamins tablet Take 1 tablet by mouth daily.    Yes [provider]  bismuth subsalicylate (PEPTO BISMOL) 262 MG/15ML suspension Take 60 mLs by mouth daily as needed for indigestion or diarrhea or loose stools.    Yes [provider]  Calcium Carbonate-Vitamin D 600-400 MG-UNIT tablet Take 1 tablet by mouth daily.   Yes [provider]  diphenoxylate-atropine (LOMOTIL) 2.5-0.025 MG/5ML liquid Take by mouth 3 (three) times daily as needed for diarrhea or loose stools.   Yes [provider]  melatonin 3 MG TABS tablet Take 3 mg by mouth at bedtime.    Yes [provider]  memantine (NAMENDA) 5 MG tablet Take 5 mg by mouth daily.   Yes [provider]  metoprolol tartrate (LOPRESSOR) 25 MG tablet Take 25 mg by mouth 2 (two) times daily.   Yes [provider]  Multiple Vitamins-Minerals (CENTRUM SILVER PO) Take 1 tablet by mouth daily. 7am-10am.   Yes [provider]  omeprazole (PRILOSEC) 20 MG capsule Take 20 mg by mouth daily.    Yes [provider]  potassium chloride SA (KLOR-CON) 20 MEQ tablet Take 20 mEq by mouth daily.    Yes [provider]    timolol (BETIMOL) 0.5 % ophthalmic solution Place 1 drop into both eyes daily. 7am-10am.   Yes [provider]  torsemide (DEMADEX) 20 MG tablet Take 20 mg by mouth daily.   Yes [provider]  metoprolol tartrate (LOPRESSOR) 25 MG tablet Take 12.5 mg by mouth once. 09/22/19 09/22/19  [provider]  NON FORMULARY Moisturizing Cream to extremities with morning and evening care Every Shift    [provider]  NON FORMULARY Regular Special Instructions: Regular Diet, Thin Liquids    [provider]    Allergies:   Allergies  Allergen Reactions  . Azopt [Brinzolamide] Other (See Comments)    UNK  . Brimonidine Other (See Comments)    UNK  . Lumigan [Bimatoprost] Other (See Comments)    UNK    Social History:  reports that she has never smoked. She has never used smokeless tobacco. She reports that she does not drink alcohol and does not use drugs.  Family History: No family history on file.   Objective   Physical Exam: Blood pressure (!) 170/56, pulse 54, temperature 97.6 F (36.4 C), temperature source Oral, resp. rate 15, height 4\' 5"  (1.346 m), weight (!) 39.9 kg, SpO2 100 %.  Physical Exam Vitals and nursing note reviewed. Exam conducted with a chaperone present.  Constitutional:      General: She is not in acute distress. HENT:     Head: Normocephalic.  Eyes:     Conjunctiva/sclera: Conjunctivae normal.  Cardiovascular:     Rate and Rhythm: Regular rhythm. Bradycardia present.     Heart sounds: No murmur heard.   Pulmonary:     Effort: Pulmonary effort is normal. No respiratory distress.  Abdominal:     General: Abdomen is flat. There is no distension.  Musculoskeletal:     Right lower leg: No edema.     Left lower leg: No edema.  Neurological:     Mental Status: She is alert. She is disoriented.     Comments: Could not say where she was  Psychiatric:        Mood and Affect: Mood normal.     LABS on  Admission: I have personally reviewed all the labs and imaging below    Basic Metabolic Panel: Recent Labs  Lab 11/02/19 1045  NA 139  K 4.5  CL 96*  CO2 28  GLUCOSE 99  BUN 33*  CREATININE 1.14*  CALCIUM 9.4   Liver Function Tests: Recent Labs  Lab 11/02/19 1045  AST 34  ALT 19  ALKPHOS 64  BILITOT 0.8  PROT 6.9  ALBUMIN 4.0   No results for input(s): LIPASE, AMYLASE in the last 168 hours. Recent Labs  Lab 11/02/19 1045  AMMONIA 11   CBC: Recent Labs  Lab  11/02/19 1045  WBC 8.6  HGB 15.6*  HCT 48.9*  MCV 94.6  PLT 250   Cardiac Enzymes: No results for input(s): CKTOTAL, CKMB, CKMBINDEX, TROPONINI in the last 168 hours. BNP: Invalid input(s): POCBNP CBG: No results for input(s): GLUCAP in the last 168 hours.  Radiological Exams on Admission:  DG Chest 2 View  Result Date: 11/02/2019 CLINICAL DATA:  Altered mental status EXAM: CHEST - 2 VIEW COMPARISON:  05/30/2019 FINDINGS: The heart size and mediastinal contours are stable. Mildly hyperexpanded lungs without focal airspace consolidation, pleural effusion, or pneumothorax. The visualized skeletal structures are unremarkable. IMPRESSION: No active cardiopulmonary disease. Electronically Signed   By: Davina Poke D.O.   On: 11/02/2019 11:42   CT Head Wo Contrast  Result Date: 11/02/2019 CLINICAL DATA:  Mental status change EXAM: CT HEAD WITHOUT CONTRAST TECHNIQUE: Contiguous axial images were obtained from the base of the skull through the vertex without intravenous contrast. COMPARISON:  CT head 08/07/2005 FINDINGS: Brain: Moderate atrophy with progression since the prior study. Atrophy most prominent in the frontal lobes. There is ventricular enlargement due to atrophy which has progressed. Periventricular white matter hypodensity bilaterally also has progressed. Negative for acute infarct, hemorrhage, mass. Vascular: Negative for hyperdense vessel Skull: Negative Sinuses/Orbits: Paranasal sinuses clear.  Bilateral cataract extraction. Other: None IMPRESSION: No acute abnormality Progression of atrophy and chronic microvascular ischemic change in the white matter compared with 2007. Electronically Signed   By: Franchot Gallo M.D.   On: 11/02/2019 11:32      EKG: Independently reviewed. Sinus bradycardia   A & P   Principal Problem:   Altered mental status Active Problems:   Paroxysmal SVT (supraventricular tachycardia) (HCC)   CKD (chronic kidney disease) stage 3, GFR 30-59 ml/min   Sinus bradycardia   Acute lower UTI   1. Altered mental status, likely multifactorial: Reduced renal function, recently started Namenda, cystitis a. Also initial concern for symptomatic bradycardia possibly leading to your decreased brain perfusion however heart rate have improved nighttime 50s compared to 40s this a.m.  Cardiology consult was held b. CT head unremarkable for acute issue c. Hold Namenda d. Continue ceftriaxone e. IV fluids f. Delirium precautions g. Hold melatonin  2. Sinus bradycardia a. takes beta-blockers for her paroxysmal SVT b. Will hold beta-blocker for now but add back if needed c. tsh unremarkable d. If she continues to have bradycardia then consider cardiology consult  3. Elevated Cr in setting of CKD 3a a. Continue IV fluids   DVT prophylaxis: heparin   Code Status: Full Code  Diet: npo pending SLP Family Communication: Admission, patients condition and plan of care including tests being ordered have been discussed with the patient who indicates understanding and agrees with the plan and Code Status.  Disposition Plan: The appropriate patient status for this patient is INPATIENT. Inpatient status is judged to be reasonable and necessary in order to provide the required intensity of service to ensure the patient's safety. The patient's presenting symptoms, physical exam findings, and initial radiographic and laboratory data in the context of their chronic comorbidities  is felt to place them at high risk for further clinical deterioration. Furthermore, it is not anticipated that the patient will be medically stable for discharge from the hospital within 2 midnights of admission. The following factors support the patient status of inpatient.   " The patient's presenting symptoms include altered mental status. " The worrisome physical exam findings include confused, frequency. " The initial radiographic and laboratory data are  worrisome because of positive UA. " The chronic co-morbidities include paroxysmal SVT.   * I certify that at the point of admission it is my clinical judgment that the patient will require inpatient hospital care spanning beyond 2 midnights from the point of admission due to high intensity of service, high risk for further deterioration and high frequency of surveillance required.*   Status is: Inpatient  Remains inpatient appropriate because:Altered mental status, Unsafe d/c plan and IV treatments appropriate due to intensity of illness or inability to take PO   Dispo: The patient is from: ALF              Anticipated d/c is to: ALF              Anticipated d/c date is: 2 days              Patient currently is not medically stable to d/c.    Consultants  . none  Procedures  . none  Time Spent on Admission: 65 minutes    Harold Hedge, DO [facility] Pager [phone] 11/02/2019, 5:36 PM

## 2019-11-02 NOTE — ED Notes (Signed)
Pt expresses need to urinate but cant. Purewick in place. Neysa Bonito Md at bedside and aware. No input since in and out cath.

## 2019-11-02 NOTE — ED Provider Notes (Signed)
[facility] DEPT Provider Note   CSN: [id number] Arrival date & time: 11/02/19  1004     History Chief Complaint  Patient presents with  . Weakness    Bianca [name] is a 86 y.o. female.  The history is provided by the patient, medical records and the nursing home. No language interpreter was used.  Altered Mental Status Presenting symptoms: confusion and disorientation   Severity:  Moderate Most recent episode:  More than 2 days ago Episode history:  Single Duration:  3 days Timing:  Constant Progression:  Waxing and waning Chronicity:  New Context: nursing home resident and recent change in medication   Context: not head injury and not recent illness   Associated symptoms: bladder incontinence and weakness (generalized)   Associated symptoms: no abdominal pain, no agitation, no difficulty breathing, no fever, no hallucinations, no headaches, no light-headedness, no nausea, no palpitations, no rash, no seizures, no visual change and no vomiting        Past Medical History:  Diagnosis Date  . COVID-19   . 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 Memory deficit 10/26/2019  . CKD (chronic kidney disease) stage 3, GFR 30-59 ml/min 09/30/2019  . Edema 06/11/2019  . Metatarsalgia 06/11/2019  . GERD (gastroesophageal reflux disease) 05/14/2019  . Paroxysmal SVT (supraventricular tachycardia) ([place]) 04/27/2019  . Pneumonia due to COVID-19 virus 04/27/2019  . Chronic diarrhea 04/27/2019  . Urinary frequency 04/27/2019  . Protein-calorie malnutrition ([place]) 04/27/2019  . Emphysema of lung ([place]) 04/27/2019    History reviewed. No pertinent surgical history.   OB History   No obstetric history on file.     No family history on file.  Social History   Tobacco Use  . Smoking status: Never Smoker  . Smokeless tobacco:  Never Used  Vaping Use  . Vaping Use: Never used  Substance Use Topics  . Alcohol use: Never  . Drug use: Never    Home Medications Prior to Admission medications   Medication Sig Start Date End Date Taking? Authorizing Provider  b complex vitamins tablet Take 1 tablet by mouth as needed.     [provider]  bismuth subsalicylate (PEPTO BISMOL) 262 MG/15ML suspension Take 60 mLs by mouth daily as needed.    [provider]  Calcium Carbonate-Vitamin D 600-400 MG-UNIT tablet Take 1 tablet by mouth daily.    [provider]  diphenoxylate-atropine (LOMOTIL) 2.5-0.025 MG/5ML liquid Take by mouth 3 (three) times daily as needed for diarrhea or loose stools.    [provider]  melatonin 3 MG TABS tablet Take 3 mg by mouth at bedtime as needed.    [provider]  metoprolol tartrate (LOPRESSOR) 25 MG tablet Take 25 mg by mouth 2 (two) times daily.    [provider]  metoprolol tartrate (LOPRESSOR) 25 MG tablet Take 12.5 mg by mouth once. 09/22/19 09/22/19  [provider]  Multiple Vitamins-Minerals (CENTRUM SILVER PO) Take 1 tablet by mouth daily. 7am-10am.    [provider]  NON FORMULARY Moisturizing Cream to extremities with morning and evening care Every Shift    [provider]  NON FORMULARY Regular Special Instructions: Regular Diet, Thin Liquids    [provider]  omeprazole (PRILOSEC) 20 MG capsule Take 20 mg by mouth daily.     [provider]  potassium chloride SA (KLOR-CON)  20 MEQ tablet Take 20 mEq by mouth daily.     [provider]  timolol (BETIMOL) 0.5 % ophthalmic solution Place 1 drop into both eyes daily. 7am-10am.    [provider]  torsemide (DEMADEX) 20 MG tablet Take 20 mg by mouth daily.    [provider]    Allergies    Azopt [brinzolamide], Brimonidine, and Lumigan [bimatoprost]  Review of Systems   Review of Systems    Constitutional: Positive for fatigue. Negative for chills, diaphoresis and fever.  HENT: Negative for congestion.   Eyes: Negative for visual disturbance.  Respiratory: Negative for cough, chest tightness and shortness of breath.   Cardiovascular: Negative for chest pain and palpitations.  Gastrointestinal: Positive for diarrhea. Negative for abdominal pain, constipation, nausea and vomiting.  Genitourinary: Positive for bladder incontinence and urgency. Negative for decreased urine volume.  Musculoskeletal: Negative for back pain, neck pain and neck stiffness.  Skin: Negative for rash and wound.  Neurological: Positive for weakness (generalized). Negative for dizziness, seizures, syncope, facial asymmetry, speech difficulty, light-headedness, numbness and headaches.  Psychiatric/Behavioral: Positive for confusion. Negative for agitation and hallucinations.  All other systems reviewed and are negative.   Physical Exam Updated Vital Signs BP (!) 133/57   Pulse (!) 44 Comment: on beta blocker  Temp 97.6 F (36.4 C) (Oral)   Resp 17   Ht '4\' 5"'  (1.346 m)   Wt (!) 39.9 kg   SpO2 98%   BMI 22.03 kg/m   Physical Exam Vitals and nursing note reviewed.  Constitutional:      General: She is not in acute distress.    Appearance: She is well-developed. She is not ill-appearing, toxic-appearing or diaphoretic.  HENT:     Head: Normocephalic and atraumatic.     Nose: Nose normal. No congestion or rhinorrhea.     Mouth/Throat:     Mouth: Mucous membranes are dry.     Pharynx: No oropharyngeal exudate or posterior oropharyngeal erythema.  Eyes:     Extraocular Movements: Extraocular movements intact.     Conjunctiva/sclera: Conjunctivae normal.     Pupils: Pupils are equal, round, and reactive to light.  Cardiovascular:     Rate and Rhythm: Regular rhythm. Bradycardia present.     Pulses: Normal pulses.     Heart sounds: No murmur heard.   Pulmonary:     Effort: Pulmonary effort is  normal. No respiratory distress.     Breath sounds: Normal breath sounds. No wheezing, rhonchi or rales.  Chest:     Chest wall: No tenderness.  Abdominal:     General: Abdomen is flat.     Palpations: Abdomen is soft.     Tenderness: There is no abdominal tenderness. There is no right CVA tenderness, left CVA tenderness, guarding or rebound.  Musculoskeletal:        General: No tenderness.     Cervical back: Neck supple. No tenderness.     Right lower leg: No edema.     Left lower leg: No edema.  Skin:    General: Skin is warm and dry.     Capillary Refill: Capillary refill takes less than 2 seconds.     Coloration: Skin is pale.     Findings: No erythema or rash.  Neurological:     General: No focal deficit present.     Mental Status: She is alert and oriented to person, place, and time.     GCS: GCS eye subscore is 4. GCS verbal subscore  is 5. GCS motor subscore is 6.     Cranial Nerves: No cranial nerve deficit, dysarthria or facial asymmetry.     Sensory: No sensory deficit.     Motor: No weakness, tremor, atrophy or abnormal muscle tone.     Coordination: Finger-Nose-Finger Test normal.  Psychiatric:        Mood and Affect: Mood normal.     ED Results / Procedures / Treatments   Labs (all labs ordered are listed, but only abnormal results are displayed) Labs Reviewed  BASIC METABOLIC PANEL - Abnormal; Notable for the following components:      Result Value   Chloride 96 (*)    BUN 33 (*)    Creatinine, Ser 1.14 (*)    GFR calc non Af Amer 41 (*)    GFR calc Af Amer 47 (*)    All other components within normal limits  CBC - Abnormal; Notable for the following components:   RBC 5.17 (*)    Hemoglobin 15.6 (*)    HCT 48.9 (*)    All other components within normal limits  URINALYSIS, ROUTINE W REFLEX MICROSCOPIC - Abnormal; Notable for the following components:   Leukocytes,Ua LARGE (*)    Bacteria, UA RARE (*)    All other components within normal limits  URINE  CULTURE  CULTURE, BLOOD (ROUTINE X 2)  CULTURE, BLOOD (ROUTINE X 2)  SARS CORONAVIRUS 2 BY RT PCR (HOSPITAL ORDER, [place] [facility])  HEPATIC FUNCTION PANEL  AMMONIA  TSH  MAGNESIUM    EKG EKG Interpretation  Date/Time:  Monday November 02 2019 10:18:38 EDT Ventricular Rate:  43 PR Interval:    QRS Duration: 83 QT Interval:  464 QTC Calculation: 393 R Axis:   -22 Text Interpretation: Sinus bradycardia Borderline left axis deviation When compared to prior, slower rate. No sTEMI Confirmed by Antony Blackbird [phone]) on 11/02/2019 10:55:45 AM   Radiology DG Chest 2 View  Result Date: 11/02/2019 CLINICAL DATA:  Altered mental status EXAM: CHEST - 2 VIEW COMPARISON:  05/30/2019 FINDINGS: The heart size and mediastinal contours are stable. Mildly hyperexpanded lungs without focal airspace consolidation, pleural effusion, or pneumothorax. The visualized skeletal structures are unremarkable. IMPRESSION: No active cardiopulmonary disease. Electronically Signed   By: Davina Poke D.O.   On: 11/02/2019 11:42   CT Head Wo Contrast  Result Date: 11/02/2019 CLINICAL DATA:  Mental status change EXAM: CT HEAD WITHOUT CONTRAST TECHNIQUE: Contiguous axial images were obtained from the base of the skull through the vertex without intravenous contrast. COMPARISON:  CT head 08/07/2005 FINDINGS: Brain: Moderate atrophy with progression since the prior study. Atrophy most prominent in the frontal lobes. There is ventricular enlargement due to atrophy which has progressed. Periventricular white matter hypodensity bilaterally also has progressed. Negative for acute infarct, hemorrhage, mass. Vascular: Negative for hyperdense vessel Skull: Negative Sinuses/Orbits: Paranasal sinuses clear. Bilateral cataract extraction. Other: None IMPRESSION: No acute abnormality Progression of atrophy and chronic microvascular ischemic change in the white matter compared with 2007. Electronically Signed   By:  Franchot Gallo M.D.   On: 11/02/2019 11:32    Procedures Procedures (including critical care time)  CRITICAL CARE Performed by: Gwenyth Allegra Harlene Petralia Total critical care time: 30 minutes Critical care time was exclusive of separately billable procedures and treating other patients. Critical care was necessary to treat or prevent imminent or life-threatening deterioration. Critical care was time spent personally by me on the following activities: development of treatment plan with patient and/or  surrogate as well as nursing, discussions with consultants, evaluation of patient's response to treatment, examination of patient, obtaining history from patient or surrogate, ordering and performing treatments and interventions, ordering and review of laboratory studies, ordering and review of radiographic studies, pulse oximetry and re-evaluation of patient's condition.   Medications Ordered in ED Medications  cefTRIAXone (ROCEPHIN) 1 g in sodium chloride 0.9 % 100 mL IVPB (1 g Intravenous New Bag/Given 11/02/19 1510)  sodium chloride flush (NS) 0.9 % injection 3 mL (3 mLs Intravenous Given 11/02/19 1125)    ED Course  I have reviewed the triage vital signs and the nursing notes.  Pertinent labs & imaging results that were available during my care of the patient were reviewed by me and considered in my medical decision making (see chart for details).    MDM Rules/Calculators/A&P                          Bianca [name] is a 86 y.o. female with a past medical history significant for emphysema, CKD, paroxysmal SVT, and was recently started on Namenda for memory loss who was sent in by her nursing facility for altered mental status.  [facility] to [facility] whom I called, the patient has not been her normal self for the last few days.  They say that she has been more fatigued and tired and is less active.  She is not getting up and getting around like she normally does.  She is normally  alert and oriented x4 and is very independent.  Patient does report she has had chronic diarrhea and has been having some urinary urgency and hesitancy but denies dysuria.  She denies any pain in her head, neck, chest/abdomen/pelvis.  She reports he has chronic right foot pain that is unchanged for years.  She denies any trauma or falls.  She does report she is been picking at her nose causing an abrasion.  She denies any other complaints.  She is alert and oriented x3 on our exam.  On exam, lungs are clear and chest is nontender.  Abdomen is nontender.  Patient moving all extremities normal sensation and strength.  Normal finger-nose-finger testing bilaterally.  Pupils are symmetric reactive normal extraocular movements.  Clear speech and symmetric smile.  She is alert and oriented x3.  Patient has no evidence of trauma on external exam.  She does have an abrasion on her nose which she reports was from picking at a scab.  She had tenderness and pain in her right foot but there is no leg tenderness or swelling otherwise.  Good pulses.  Normal sensation and strength.  The facility was concerned the patient may have some other cause of altered mental status as it has not normal baseline however they do report that she was recently started on a cognition enhancing medication Namenda.  Unclear if this is just a side effect from the new medication or something else.  We will get screening labs as well as a CT head to make sure there was not some changes going on.  We will get work-up to look for occult infection with a chest x-ray and urinalysis.  We will rule out  2:38 PM Patient continues to have urgency and hesitancy. Her work-up returned showing likely UTI given the leukocytes, whites, and bacteria in the setting of urinary symptoms. Also suspect this is contributing to the altered mental status. Patient now does feel that she is slightly more  confused than her normal self and she is concerned about going home  not feeling normal as she is very independent normally. Her CT had did not show any new acute changes and her chest x-ray did not show pneumonia. Will add on a coronavirus swab and give her Rocephin. We will get blood cultures as well. We will met her for mental status changes that are transient in the setting of UTI in a 86 year old..  2:50 PM Just spoke with admitting hospitalist team they agreed for admission but due to her heart rate being in the 40s, they are requesting a cardiology consultation.  Patient reported to me that she did not think she took any medications when including her beta-blocker so she is having mild bradycardia.  We will discussed this with them.  We will add on a magnesium to see if this is abnormal as well.   3:12 PM Cardiology was called and they will come see the patient.  They agreed with holding on troponin given the lack of chest pain or palpitations or shortness of breath.  They agreed with a magnesium level which will be ordered.  Patient will still be admitted to medicine service.   Final Clinical Impression(s) / ED Diagnoses Final diagnoses:  Altered awareness, transient  Acute cystitis without hematuria  Bradycardia    Clinical Impression: 1. Altered awareness, transient   2. Acute cystitis without hematuria   3. Bradycardia     Disposition: Admit  This note was prepared with assistance of Dragon voice recognition software. Occasional wrong-word or sound-a-like substitutions may have occurred due to the inherent limitations of voice recognition software.     Marcelle Bebout, Gwenyth Allegra, MD 11/02/19 [phone]

## 2019-11-02 NOTE — ED Triage Notes (Signed)
Per facility, [facility], pt weakness/not self over past few days. Usually up at breakfast and hasn't been. Pt alert to self, situation, president. Pt does not know date.   Pt complaint of right foot pain; has been seen for same by doctor.

## 2019-11-02 NOTE — ED Notes (Signed)
Only able to obtain 1 set of BC due to diff stick

## 2019-11-02 NOTE — ED Notes (Signed)
In and out Cath successful 329ml output

## 2019-11-02 NOTE — ED Notes (Signed)
Purewick has been placed.  

## 2019-11-02 NOTE — ED Notes (Signed)
Pt had 1 BM, brief and linens changed.

## 2019-11-02 NOTE — ED Notes (Signed)
Pt provided with soup and water.

## 2019-11-03 DIAGNOSIS — E861 Hypovolemia: Secondary | ICD-10-CM

## 2019-11-03 DIAGNOSIS — R404 Transient alteration of awareness: Secondary | ICD-10-CM

## 2019-11-03 DIAGNOSIS — N1831 Chronic kidney disease, stage 3a: Secondary | ICD-10-CM

## 2019-11-03 DIAGNOSIS — R000 Tachycardia, unspecified: Secondary | ICD-10-CM

## 2019-11-03 DIAGNOSIS — I9589 Other hypotension: Secondary | ICD-10-CM

## 2019-11-03 DIAGNOSIS — I959 Hypotension, unspecified: Secondary | ICD-10-CM

## 2019-11-03 DIAGNOSIS — N3000 Acute cystitis without hematuria: Principal | ICD-10-CM

## 2019-11-03 LAB — CBC
HCT: 47.4 % — ABNORMAL HIGH (ref 36.0–46.0)
Hemoglobin: 14.9 g/dL (ref 12.0–15.0)
MCH: 30.2 pg (ref 26.0–34.0)
MCHC: 31.4 g/dL (ref 30.0–36.0)
MCV: 96 fL (ref 80.0–100.0)
Platelets: 230 10*3/uL (ref 150–400)
RBC: 4.94 MIL/uL (ref 3.87–5.11)
RDW: 15 % (ref 11.5–15.5)
WBC: 6.8 10*3/uL (ref 4.0–10.5)
nRBC: 0 % (ref 0.0–0.2)

## 2019-11-03 LAB — COMPREHENSIVE METABOLIC PANEL
ALT: 18 U/L (ref 0–44)
AST: 32 U/L (ref 15–41)
Albumin: 3.5 g/dL (ref 3.5–5.0)
Alkaline Phosphatase: 57 U/L (ref 38–126)
Anion gap: 18 — ABNORMAL HIGH (ref 5–15)
BUN: 32 mg/dL — ABNORMAL HIGH (ref 8–23)
CO2: 22 mmol/L (ref 22–32)
Calcium: 8.5 mg/dL — ABNORMAL LOW (ref 8.9–10.3)
Chloride: 103 mmol/L (ref 98–111)
Creatinine, Ser: 1.02 mg/dL — ABNORMAL HIGH (ref 0.44–1.00)
GFR calc Af Amer: 54 mL/min — ABNORMAL LOW (ref >60)
GFR calc non Af Amer: 47 mL/min — ABNORMAL LOW (ref >60)
Glucose, Bld: 76 mg/dL (ref 70–99)
Potassium: 4.1 mmol/L (ref 3.5–5.1)
Sodium: 143 mmol/L (ref 135–145)
Total Bilirubin: 0.9 mg/dL (ref 0.3–1.2)
Total Protein: 6.1 g/dL — ABNORMAL LOW (ref 6.5–8.1)

## 2019-11-03 LAB — VITAMIN B12: Vitamin B-12: 1009 pg/mL — ABNORMAL HIGH (ref 180–914)

## 2019-11-03 LAB — MAGNESIUM: Magnesium: 2.3 mg/dL (ref 1.7–2.4)

## 2019-11-03 MED ORDER — SODIUM CHLORIDE 0.9 % IV BOLUS
500.0000 mL | Freq: Once | INTRAVENOUS | Status: AC
  Administered 2019-11-03: 500 mL via INTRAVENOUS

## 2019-11-03 MED ORDER — SODIUM CHLORIDE 0.9 % IV SOLN: INTRAVENOUS | Status: AC

## 2019-11-03 NOTE — Progress Notes (Addendum)
Dr. Olevia Bowens notified of patient receiving two doses of 5mg  Metoprolol for HR >110. Patient remains in 130's. Patient MEWS 4, MEWS red initiated.  See new orders.

## 2019-11-03 NOTE — Progress Notes (Signed)
TRH night shift.  The staff notified me earlier that the patient continues to be tachycardic in the 120s and 130s despite receiving metoprolol 5 mg IVP x2 in the past 2 hours.She is also hypotensive with a BP of 84/69 mmHg.  RR is 20 bpm and O2 sat 96%.  A 500 mL bolus over 2 hours was ordered.  Discontinued hydralazine to avoid reflex tachycardia.  If bolus does not improve her heart rate, I will use low-dose digoxin x1.  Tennis Must, MD.

## 2019-11-03 NOTE — TOC Initial Note (Signed)
Transition of Care [facility]) - Initial/Assessment Note    Patient Details  Name: Bianca [name] MRN: [id number] Date of Birth: 1923/04/09  [facility] CM/SW Contact:    Dessa Phi, RN Phone Number: 11/03/2019, 12:16 PM  Clinical Narrative:d/c plans discusses w/nephew-From ALF-[facility]-per nephew-limited asst,w/adl's,has rw-d/c plan return. Left vm w/[facility] liason-await call back. PT cons placed.                   Expected Discharge Plan: Assisted Living Barriers to Discharge: Continued Medical Work up   Patient Goals and CMS Choice Patient states their goals for this hospitalization and ongoing recovery are:: return back to [place] CMS Medicare.gov Compare Post Acute Care list provided to:: Patient Represenative (must comment) Trellis Paganini) Choice offered to / list presented to : Adult Children Barrister's clerk)  Expected Discharge Plan and Services Expected Discharge Plan: Assisted Living   Discharge Planning Services: CM Consult Post Acute Care Choice:  (ALF) Living arrangements for the past 2 months: [place]                                      Prior Living Arrangements/Services Living arrangements for the past 2 months: [place] Lives with:: Facility Resident Patient language and need for interpreter reviewed:: Yes Do you feel safe going back to the place where you live?: Yes      Need for Family Participation in Patient Care: No (Comment) Care giver support system in place?: Yes (comment) Current home services: DME (rw) Criminal Activity/Legal Involvement Pertinent to Current Situation/Hospitalization: No - Comment as needed  Activities of Daily Living Home Assistive Devices/Equipment: Walker (specify type) ADL Screening (condition at time of admission) Patient's cognitive ability adequate to safely complete daily activities?: No (new altered mental status) Is the patient deaf or have  difficulty hearing?: No Does the patient have difficulty seeing, even when wearing glasses/contacts?: Yes (bilateral wet macular degeneration) Does the patient have difficulty concentrating, remembering, or making decisions?: Yes Patient able to express need for assistance with ADLs?: Yes Does the patient have difficulty dressing or bathing?: Yes Independently performs ADLs?: No Communication: Independent Dressing (OT): Needs assistance Is this a change from baseline?: Change from baseline, expected to last <3days Grooming: Needs assistance Is this a change from baseline?: Change from baseline, expected to last <3 days Feeding: Needs assistance Is this a change from baseline?: Change from baseline, expected to last <3 days Bathing: Needs assistance Is this a change from baseline?: Change from baseline, expected to last <3 days Toileting: Needs assistance Is this a change from baseline?: Change from baseline, expected to last <3 days In/Out Bed: Needs assistance Is this a change from baseline?: Change from baseline, expected to last <3 days Walks in Home: Needs assistance Is this a change from baseline?: Change from baseline, expected to last <3 days Does the patient have difficulty walking or climbing stairs?: Yes Weakness of Legs: Both Weakness of Arms/Hands: Both  Permission Sought/Granted Permission sought to share information with : Case Manager Permission granted to share information with : Yes, Verbal Permission Granted  Share Information with NAME: Case manager           Emotional Assessment Appearance:: Appears stated age Attitude/Demeanor/Rapport: Gracious Affect (typically observed): Accepting Orientation: : Oriented to Self Alcohol / Substance Use: Not Applicable Psych Involvement: No (comment)  Admission diagnosis:  Bradycardia [R00.1]  Altered mental status [R41.82] Acute cystitis without hematuria [N30.00] Altered awareness, transient [R40.4] Patient Active  Problem List   Diagnosis Date Noted  . Altered mental status 11/02/2019  . Sinus bradycardia 11/02/2019  . Acute lower UTI 11/02/2019  . Memory deficit 10/26/2019  . CKD (chronic kidney disease) stage 3, GFR 30-59 ml/min 09/30/2019  . Edema 06/11/2019  . Metatarsalgia 06/11/2019  . GERD (gastroesophageal reflux disease) 05/14/2019  . Paroxysmal SVT (supraventricular tachycardia) ([place]) 04/27/2019  . Pneumonia due to COVID-19 virus 04/27/2019  . Chronic diarrhea 04/27/2019  . Urinary frequency 04/27/2019  . Protein-calorie malnutrition ([place]) 04/27/2019  . Emphysema of lung ([place]) 04/27/2019   PCP:  [name], [name], NP Pharmacy:   [facility] [address] [place] - [address] [place] [address] Phone: [phone] Fax: [phone]     Social Determinants of Health (SDOH) Interventions    Readmission Risk Interventions No flowsheet data found.

## 2019-11-03 NOTE — Progress Notes (Addendum)
[facility]  PROGRESS NOTE  Bianca [name] WCB:[id number] DOB: 1922/12/09 DOA: 11/02/2019 PCP: [name], [name], NP Admit date - 11/02/2019   Admitting Physician Harold Hedge, MD  Outpatient Primary MD for the patient is [name], [name], NP  LOS - 1 Brief Narrative   Bianca [name] is a 86 y.o. year old female with medical history significant for history of SVT on metoprolol, GERD who presented on 11/02/2019 from her ALF with reports of altered mental status including being more fatigue, less active after recently being started on Namenda, as well as increased urinary urgency/hesitancy without dysuria.  Patient was found to be bradycardic to the 40s in the ED with pyuria.  Patient was started on ceftriaxone for presumed UTI.  Initially cardiology was consulted for bradycardia however due to improvement of heart rate at bedside consult was discontinued in the ED.  Hospital course complicated by transient tachycardia overnight in the setting of holding home beta-blockade for her paroxysmal SVT  Subjective  Overnight patient was tachycardic to the 120s to 130s with blood pressure of 84/69 requiring Lopressor 5 mg x 2.  Patient is given 500 C bolus.  Hydralazine was discontinued to avoid reflex tachycardia. This morning patient reports chronic right foot pain (ongoing for several months) A & P   Altered mental status presumed secondary to infection.  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  No other obvious polypharmacy seems like she is close to her baseline she is alert, oriented to place, person, context.  Has no focal deficits. Lab work-up.  B12, ammonia, TSH unremarkable nonacute. -Continue treatment for UTI -Hold home Namenda -Delirium precautions  Hypotension.  SBP in the 80s to 90s.  Could be related to ongoing infection, or probable decreased oral intake related to infection as patient is noticeably dry on exam. -Hold home torsemide -IV fluids  x24 hours and reassess volume status -Encourage oral intake -follow blood cultures  Pyuria with reported urinary symptoms, concerning for UTI.  UA with large leukocytes and rare bacteria, consistent with pyuria.  On my exam patient not endorsing any urinary symptoms, reported dysuria prior to admission. -Continue IV ceftriaxone -Monitor urine/blood culture  Paroxysmal SVT, transient tachycardia occurred overnight to the 120s-130s.  No recurrent episodes so far.  Status post IV metoprolol times 5 mg x 2 overnight.  Currently heart rate in the 50s -Holding off on further beta-blockade given low blood pressure and bradycardia initially on admission, if recurs will consult cardiology -Monitor on telemetry  History of Bradycardia, stable.  Initial concern this could be a potential driver altered mental status with initial heart rate of 44 on admission with plan for cardiology consult in ED which was canceled due to improvement in heart rate.  Had elevated heart rates in the 120s to 130s overnight and received IV metoprolol 5 mg x 2 and since then has had heart rate in the 50s.  TSH unremarkable, EKG showed sinus bradycardia. -Would expect improvement in heart rate over time while awaiting washout of beta-blockade -Continue to hold home metoprolol  -Monitor on telemetry  GERD, stable -Continue Protonix     Family Communication  : We will update nephew  Code Status : Full, discussed on day of admission  Disposition Plan  :  Patient is from ALF, [facility] home. Anticipated d/c date: 2 to 3 days. Barriers to d/c or necessity for inpatient status:  IV ceftriaxone for presumed UTI, IV fluids for hypotension Consults  :  None Procedures  : None  DVT Prophylaxis  : Heparin  Lab Results  Component Value Date   PLT 230 11/03/2019    Diet :  Diet Order            DIET SOFT Room service appropriate? Yes; Fluid consistency: Thin  Diet effective now                  Inpatient  Medications Scheduled Meds: . heparin  5,000 Units Subcutaneous Q8H  . melatonin  3 mg Oral QHS  . pantoprazole  40 mg Oral Daily  . timolol  1 drop Both Eyes Daily   Continuous Infusions: . sodium chloride 75 mL/hr at 11/03/19 1134  . cefTRIAXone (ROCEPHIN)  IV 1 g (11/03/19 1425)   PRN Meds:.acetaminophen **OR** acetaminophen, bismuth subsalicylate, diphenoxylate-atropine, metoprolol tartrate  Antibiotics  :   Anti-infectives (From admission, onward)   Start     Dose/Rate Route Frequency Ordered Stop   11/03/19 1500  cefTRIAXone (ROCEPHIN) 1 g in sodium chloride 0.9 % 100 mL IVPB     Discontinue     1 g 200 mL/hr over 30 Minutes Intravenous Every 24 hours 11/02/19 1739 11/05/19 1459   11/02/19 1445  cefTRIAXone (ROCEPHIN) 1 g in sodium chloride 0.9 % 100 mL IVPB        1 g 200 mL/hr over 30 Minutes Intravenous  Once 11/02/19 1437 11/02/19 1548       Objective   Vitals:   11/03/19 0539 11/03/19 0642 11/03/19 1023 11/03/19 1404  BP: (!) 87/52 (!) 99/52 (!) 98/45 (!) 96/51  Pulse: 51 50 52 55  Resp: 16 14 20 22   Temp: 98 F (36.7 C) 97.8 F (36.6 C) (!) 97.4 F (36.3 C) 98 F (36.7 C)  TempSrc: Oral Oral Oral Oral  SpO2: 98% 100% 99% 97%  Weight:      Height:        SpO2: 97 %  Wt Readings from Last 3 Encounters:  11/02/19 (!) 36.4 kg  10/26/19 39.9 kg  09/22/19 43.9 kg     Intake/Output Summary (Last 24 hours) at 11/03/2019 1558 Last data filed at 11/03/2019 1016 Gross per 24 hour  Intake 411.52 ml  Output 152 ml  Net 259.52 ml    Physical Exam:     Awake Alert, oriented to person, place, time, context, normal affect Dry oral mucosa Following all commands, no focal deficits [place].AT, Normal respiratory effort on room air, CTAB Slow heart rate, regular rhythm, no Gallops,Rubs or new Murmurs, no edema +ve B.Sounds, Abd Soft, No tenderness, No rebound, guarding or rigidity. No Cyanosis, No new Rash or bruise     I have personally reviewed the  following:   Data Reviewed:  CBC Recent Labs  Lab 11/02/19 1045 11/03/19 0454  WBC 8.6 6.8  HGB 15.6* 14.9  HCT 48.9* 47.4*  PLT 250 230  MCV 94.6 96.0  MCH 30.2 30.2  MCHC 31.9 31.4  RDW 15.1 15.0    Chemistries  Recent Labs  Lab 11/02/19 1045 11/03/19 0454  NA 139 143  K 4.5 4.1  CL 96* 103  CO2 28 22  GLUCOSE 99 76  BUN 33* 32*  CREATININE 1.14* 1.02*  CALCIUM 9.4 8.5*  MG  --  2.3  AST 34 32  ALT 19 18  ALKPHOS 64 57  BILITOT 0.8 0.9   ------------------------------------------------------------------------------------------------------------------ No results for input(s): CHOL, HDL, LDLCALC, TRIG, CHOLHDL, LDLDIRECT in the last 72 hours.  No results found  for: HGBA1C ------------------------------------------------------------------------------------------------------------------ Recent Labs    11/02/19 1040  TSH 1.629   ------------------------------------------------------------------------------------------------------------------ Recent Labs    11/03/19 0936  VITAMINB12 1,009*    Coagulation profile No results for input(s): INR, PROTIME in the last 168 hours.  No results for input(s): DDIMER in the last 72 hours.  Cardiac Enzymes No results for input(s): CKMB, TROPONINI, MYOGLOBIN in the last 168 hours.  Invalid input(s): CK ------------------------------------------------------------------------------------------------------------------ No results found for: BNP  Micro Results Recent Results (from the past 240 hour(s))  Urine culture     Status: None (Preliminary result)   Collection Time: 11/02/19 10:47 AM   Specimen: Urine, Random  Result Value Ref Range Status   Specimen Description   Final    URINE, RANDOM Performed at [facility] [address] [place], [place] [address]    Special Requests   Final    NONE Performed at [facility], [place] [address] [place], [place] [address]    Culture PENDING   Incomplete   Report Status PENDING  Incomplete  Blood culture (routine x 2)     Status: None (Preliminary result)   Collection Time: 11/02/19  2:38 PM   Specimen: BLOOD  Result Value Ref Range Status   Specimen Description   Final    BLOOD RIGHT ANTECUBITAL Performed at [facility], [place] [address] [place], [place] [address]    Special Requests   Final    BOTTLES DRAWN AEROBIC ONLY Blood Culture adequate volume Performed at [facility] [address], [place], [place] [address]    Culture   Final    NO GROWTH < 24 HOURS Performed at [facility] [address], [place], [place] [address]    Report Status PENDING  Incomplete  SARS Coronavirus 2 by RT PCR (hospital order, performed in [facility] hospital lab) Nasopharyngeal Nasopharyngeal Swab     Status: None   Collection Time: 11/02/19  2:40 PM   Specimen: Nasopharyngeal Swab  Result Value Ref Range Status   SARS Coronavirus 2 NEGATIVE NEGATIVE Final    Comment: (NOTE) SARS-CoV-2 target nucleic acids are NOT DETECTED.  The SARS-CoV-2 RNA is generally detectable in upper and lower respiratory specimens during the acute phase of infection. The lowest concentration of SARS-CoV-2 viral copies this assay can detect is 250 copies / mL. A negative result does not preclude SARS-CoV-2 infection and should not be used as the sole basis for treatment or other patient management decisions.  A negative result may occur with improper specimen collection / handling, submission of specimen other than nasopharyngeal swab, presence of viral mutation(s) within the areas targeted by this assay, and inadequate number of viral copies (<250 copies / mL). A negative result must be combined with clinical observations, patient history, and epidemiological information.  Fact Sheet for Patients:   StrictlyIdeas.no  Fact Sheet for Healthcare  Providers: BankingDealers.co.za  This test is not yet approved or  cleared by the [place] FDA and has been authorized for detection and/or diagnosis of SARS-CoV-2 by FDA under an Emergency Use Authorization (EUA).  This EUA will remain in effect (meaning this test can be used) for the duration of the COVID-19 declaration under Section 564(b)(1) of the Act, 21 U.S.C. section 360bbb-3(b)(1), unless the authorization is terminated or revoked sooner.  Performed at [facility], [place] [address] [place], [place] [address]     Radiology Reports DG Chest 2 View  Result Date: 11/02/2019 CLINICAL  DATA:  Altered mental status EXAM: CHEST - 2 VIEW COMPARISON:  05/30/2019 FINDINGS: The heart size and mediastinal contours are stable. Mildly hyperexpanded lungs without focal airspace consolidation, pleural effusion, or pneumothorax. The visualized skeletal structures are unremarkable. IMPRESSION: No active cardiopulmonary disease. Electronically Signed   By: Davina Poke D.O.   On: 11/02/2019 11:42   CT Head Wo Contrast  Result Date: 11/02/2019 CLINICAL DATA:  Mental status change EXAM: CT HEAD WITHOUT CONTRAST TECHNIQUE: Contiguous axial images were obtained from the base of the skull through the vertex without intravenous contrast. COMPARISON:  CT head 08/07/2005 FINDINGS: Brain: Moderate atrophy with progression since the prior study. Atrophy most prominent in the frontal lobes. There is ventricular enlargement due to atrophy which has progressed. Periventricular white matter hypodensity bilaterally also has progressed. Negative for acute infarct, hemorrhage, mass. Vascular: Negative for hyperdense vessel Skull: Negative Sinuses/Orbits: Paranasal sinuses clear. Bilateral cataract extraction. Other: None IMPRESSION: No acute abnormality Progression of atrophy and chronic microvascular ischemic change in the white matter compared with 2007. Electronically  Signed   By: Franchot Gallo M.D.   On: 11/02/2019 11:32     Time Spent in minutes  30     Desiree Hane M.D on 11/03/2019 at 3:58 PM  To page go to [email or web address] - password [facility]

## 2019-11-04 ENCOUNTER — Inpatient Hospital Stay (HOSPITAL_COMMUNITY): Payer: Medicare Other

## 2019-11-04 ENCOUNTER — Encounter (INDEPENDENT_AMBULATORY_CARE_PROVIDER_SITE_OTHER): Payer: Medicare Other | Admitting: Ophthalmology

## 2019-11-04 DIAGNOSIS — R4182 Altered mental status, unspecified: Secondary | ICD-10-CM

## 2019-11-04 DIAGNOSIS — I959 Hypotension, unspecified: Secondary | ICD-10-CM

## 2019-11-04 LAB — BASIC METABOLIC PANEL
Anion gap: 10 (ref 5–15)
BUN: 25 mg/dL — ABNORMAL HIGH (ref 8–23)
CO2: 24 mmol/L (ref 22–32)
Calcium: 8.2 mg/dL — ABNORMAL LOW (ref 8.9–10.3)
Chloride: 107 mmol/L (ref 98–111)
Creatinine, Ser: 0.86 mg/dL (ref 0.44–1.00)
GFR calc Af Amer: >60 mL/min (ref >60)
GFR calc non Af Amer: 57 mL/min — ABNORMAL LOW (ref >60)
Glucose, Bld: 92 mg/dL (ref 70–99)
Potassium: 3.6 mmol/L (ref 3.5–5.1)
Sodium: 141 mmol/L (ref 135–145)

## 2019-11-04 LAB — MRSA PCR SCREENING: MRSA by PCR: NEGATIVE

## 2019-11-04 MED ORDER — SODIUM CHLORIDE 0.9 % IV SOLN
1.0000 g | INTRAVENOUS | Status: DC
  Filled 2019-11-04: qty 10

## 2019-11-04 NOTE — Progress Notes (Signed)
PROGRESS NOTE    Bianca [name]  CWC:[id number] DOB: [birth year] DOA: 11/02/2019 PCP: [name], [name], NP    Chief Complaint  Patient presents with  . Weakness    Brief Narrative:   86 year old lady with prior history of SVT on metoprolol, GERD presents from ALF with altered mental status.  On arrival to ED she was found to be bradycardic.  EKG shows sinus bradycardia.  Hospital course was complicated by transient tachycardia in the setting of holding home beta-blockers for paroxysmal SVT.  Assessment & Plan:   Principal Problem:   Altered mental status Active Problems:   Paroxysmal SVT (supraventricular tachycardia) (HCC)   CKD (chronic kidney disease) stage 3, GFR 30-59 ml/min   Sinus bradycardia   Acute lower UTI   Tachycardia   Hypotension   Acute encephalopathy probably secondary to urinary tract infection and medications.  TSH within normal limits Vitamin B12 and ammonia are within normal limits Patient was started on IV Rocephin for urinary tract infection. Urine cultures grew 60,000 E. coli.  Hold home Elias-Fela Solis.      Hypotension: Probably secondary to decreased oral intake versus dehydration from infection versus baseline borderline blood pressures. Blood pressure parameters continue to be borderline but she remains asymptomatic at this time.    Paroxysmal SVT Currently heart rate is in low 50s continue to hold beta-blocker as blood pressure parameters have been borderline.  Continue to monitor on telemetry.    Bradycardia Asymptomatic. Continue to hold beta-blocker.   GERD Stable.      DVT prophylaxis: Heparin Code Status: DNR  family Communication: None at bedside Disposition:   Status is: Inpatient  Remains inpatient appropriate because:IV treatments appropriate due to intensity of illness or inability to take PO   Dispo: The patient is from: ALF              Anticipated d/c is to: ALF              Anticipated d/c date is: 1 day               Patient currently is not medically stable to d/c.       Consultants:   None  Procedures: None Antimicrobials: IV Rocephin  Subjective: No new complaints  Objective: Vitals:   11/03/19 2133 11/04/19 0034 11/04/19 0555 11/04/19 1306  BP: (!) 116/50 (!) 99/42 (!) 109/50 (!) 94/48  Pulse: 50 49 (!) 42 52  Resp: 20 18 20 18   Temp: (!) 97.5 F (36.4 C) 98 F (36.7 C) 97.6 F (36.4 C) 98 F (36.7 C)  TempSrc: Oral Oral Oral Oral  SpO2: 99% 97% 100% 96%  Weight:      Height:        Intake/Output Summary (Last 24 hours) at 11/04/2019 1611 Last data filed at 11/04/2019 1308 Gross per 24 hour  Intake 680.64 ml  Output 5 ml  Net 675.64 ml   Filed Weights   11/02/19 1014 11/02/19 2355  Weight: (!) 39.9 kg (!) 36.4 kg    Examination:  General exam: Appears calm and comfortable  Respiratory system: Clear to auscultation. Respiratory effort normal. Cardiovascular system: S1 & S2 heard, RRR. No JVD.  No pedal edema. Gastrointestinal system: Abdomen is nondistended, soft and nontender.  Normal bowel sounds heard. Central nervous system: Alert and oriented to person only  extremities: No pedal edema Skin: No rashes, lesions or ulcers Psychiatry: Mood & affect appropriate.     Data Reviewed: I have personally reviewed following labs  and imaging studies  CBC: Recent Labs  Lab 11/02/19 1045 11/03/19 0454  WBC 8.6 6.8  HGB 15.6* 14.9  HCT 48.9* 47.4*  MCV 94.6 96.0  PLT 250 132    Basic Metabolic Panel: Recent Labs  Lab 11/02/19 1045 11/03/19 0454 11/04/19 0535  NA 139 143 141  K 4.5 4.1 3.6  CL 96* 103 107  CO2 28 22 24   GLUCOSE 99 76 92  BUN 33* 32* 25*  CREATININE 1.14* 1.02* 0.86  CALCIUM 9.4 8.5* 8.2*  MG  --  2.3  --     GFR: Estimated Creatinine Clearance: 22.5 mL/min (by C-G formula based on SCr of 0.86 mg/dL).  Liver Function Tests: Recent Labs  Lab 11/02/19 1045 11/03/19 0454  AST 34 32  ALT 19 18  ALKPHOS 64 57  BILITOT 0.8 0.9    PROT 6.9 6.1*  ALBUMIN 4.0 3.5    CBG: No results for input(s): GLUCAP in the last 168 hours.   Recent Results (from the past 240 hour(s))  Urine culture     Status: Abnormal (Preliminary result)   Collection Time: 11/02/19 10:47 AM   Specimen: Urine, Random  Result Value Ref Range Status   Specimen Description   Final    URINE, RANDOM Performed at [facility] [address] [place], [place] [address]    Special Requests   Final    NONE Performed at [facility], [place] [address], [place], [place] [address]    Culture (A)  Final    60,000 COLONIES/mL ESCHERICHIA COLI SUSCEPTIBILITIES TO FOLLOW Performed at [facility], [place] [address], [place], [place] [address]    Report Status PENDING  Incomplete  Blood culture (routine x 2)     Status: None (Preliminary result)   Collection Time: 11/02/19  2:38 PM   Specimen: BLOOD  Result Value Ref Range Status   Specimen Description   Final    BLOOD RIGHT ANTECUBITAL Performed at [facility], [place] [address] [place], [place] [address]    Special Requests   Final    BOTTLES DRAWN AEROBIC ONLY Blood Culture adequate volume Performed at [facility] [address], [place], [place] [address]    Culture   Final    NO GROWTH < 24 HOURS Performed at [facility] [address] [place], [place] [address]    Report Status PENDING  Incomplete  SARS Coronavirus 2 by RT PCR (hospital order, performed in [facility] hospital lab) Nasopharyngeal Nasopharyngeal Swab     Status: None   Collection Time: 11/02/19  2:40 PM   Specimen: Nasopharyngeal Swab  Result Value Ref Range Status   SARS Coronavirus 2 NEGATIVE NEGATIVE Final    Comment: (NOTE) SARS-CoV-2 target nucleic acids are NOT DETECTED.  The SARS-CoV-2 RNA is generally detectable in upper and lower respiratory specimens during the acute phase of infection. The lowest concentration of SARS-CoV-2 viral copies this  assay can detect is 250 copies / mL. A negative result does not preclude SARS-CoV-2 infection and should not be used as the sole basis for treatment or other patient management decisions.  A negative result may occur with improper specimen collection / handling, submission of specimen other than nasopharyngeal swab, presence of viral mutation(s) within the areas targeted by this assay, and inadequate number of viral copies (<250 copies / mL). A negative result must be combined with clinical observations, patient history, and epidemiological information.  Fact Sheet  for Patients:   StrictlyIdeas.no  Fact Sheet for Healthcare Providers: BankingDealers.co.za  This test is not yet approved or  cleared by the [place] FDA and has been authorized for detection and/or diagnosis of SARS-CoV-2 by FDA under an Emergency Use Authorization (EUA).  This EUA will remain in effect (meaning this test can be used) for the duration of the COVID-19 declaration under Section 564(b)(1) of the Act, 21 U.S.C. section 360bbb-3(b)(1), unless the authorization is terminated or revoked sooner.  Performed at [facility], [place] [address], [place], [place] [address]   MRSA PCR Screening     Status: None   Collection Time: 11/04/19  2:02 PM   Specimen: Nasal Mucosa; Nasopharyngeal  Result Value Ref Range Status   MRSA by PCR NEGATIVE NEGATIVE Final    Comment:        The GeneXpert MRSA Assay (FDA approved for NASAL specimens only), is one component of a comprehensive MRSA colonization surveillance program. It is not intended to diagnose MRSA infection nor to guide or monitor treatment for MRSA infections. Performed at [facility], [place] [address] [place], [place] [address]          Radiology Studies: No results found.      Scheduled Meds: . heparin  5,000 Units Subcutaneous Q8H  . melatonin  3 mg  Oral QHS  . pantoprazole  40 mg Oral Daily  . timolol  1 drop Both Eyes Daily   Continuous Infusions:   LOS: 2 days       Hosie Poisson, MD [facility]   To contact the attending provider between 7A-7P or the covering provider during after hours 7P-7A, please log into the web site [email or web address] and access using universal [place] password for that web site. If you do not have the password, please call the hospital operator.  11/04/2019, 4:11 PM

## 2019-11-04 NOTE — Progress Notes (Signed)
Spoke with Dr. Karleen Hampshire about orders to obtain stool sample for CDiff testing - patient having mushy stools with some some solid pieces in it.  Patient without fever or increased WBCs and no complaints of abdominal pain.  Will discontinue order for testing at this time.

## 2019-11-04 NOTE — Evaluation (Signed)
Clinical/Bedside Swallow Evaluation Patient Details  Name: MELANI BRISBANE MRN: [id number] Date of Birth: 04/11/1922  Today's Date: 11/04/2019 Time: SLP Start Time (ACUTE ONLY): 8242 SLP Stop Time (ACUTE ONLY): 1626 SLP Time Calculation (min) (ACUTE ONLY): 20 min  Past Medical History:  Past Medical History:  Diagnosis Date  . COVID-19   . 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 No pertinent surgical history. HPI:  86yo female admitted 11/02/19 with weakness, AMS. PMH: emphysema, CKD, GERD, paroxysmal SVT. CTHead = progression of atrophy and chronic microvascular ischemic changes but no acute changes. CXR - negative   Assessment / Plan / Recommendation Clinical Impression  Pt seen for bedside swallow evaluation. Pt reports no history of swallowing difficulty, but that she takes her time so she doesn't get choked. She is missing dentition, and reports tolerating soft solids prior to admit. CN exam unremarkable. Pt accepted trials of thin liquid, puree, and solid textures. No obvious oral issues or overt s/s aspiration observed on any consistency given. Recommend continuing Dys3 (soft) diet and thin liquids. No further ST intervention recommended at this time. Please reconsult if needs arise.   SLP Visit Diagnosis: Dysphagia, unspecified (R13.10)    Aspiration Risk  Mild aspiration risk    Diet Recommendation Dysphagia 3 (Mech soft);Thin liquid   Liquid Administration via: Straw;Cup Medication Administration: Whole meds with liquid Supervision: Patient able to self feed Compensations: Minimize environmental distractions;Slow rate;Small sips/bites Postural Changes: Seated upright at 90 degrees    Other  Recommendations Oral Care Recommendations: Oral care BID   Follow up Recommendations None          Prognosis Prognosis for Safe Diet Advancement: Fair (poor dentition)       Swallow Study   General Date of Onset: 11/02/19 HPI: 86yo female admitted 11/02/19 with weakness, AMS. PMH: emphysema, CKD, GERD, paroxysmal SVT. CTHead = progression of atrophy and chronic microvascular ischemic changes but no acute changes. CXR - negative Type of Study: Bedside Swallow Evaluation Previous Swallow Assessment: none Diet Prior to this Study: Dysphagia 3 (soft);Thin liquids Temperature Spikes Noted: No Respiratory Status: Room air History of Recent Intubation: No Behavior/Cognition: Cooperative;Pleasant mood;Alert Oral Cavity Assessment: Within Functional Limits Oral Care Completed by SLP: No Oral Cavity - Dentition: Missing dentition Vision: Functional for self-feeding Patient Positioning: Upright in bed Baseline Vocal Quality: Normal Volitional Cough: Strong Volitional Swallow: Able to elicit    Oral/Motor/Sensory Function Overall Oral Motor/Sensory Function: Within functional limits   Ice Chips Ice chips: Not tested   Thin Liquid Thin Liquid: Within functional limits Presentation: Straw    Nectar Thick Nectar Thick Liquid: Not tested   Honey Thick Honey Thick Liquid: Not tested   Puree Puree: Within functional limits Presentation: Self Fed;Spoon   Solid     Solid: Within functional limits Presentation: [place] [facility], [facility], [place] Speech Language Pathologist Office: [phone]  Shonna Chock 11/04/2019,4:26 PM

## 2019-11-04 NOTE — Evaluation (Signed)
Occupational Therapy Evaluation Patient Details Name: Bianca [name] MRN: [id number] DOB: 1923/03/13 Today's Date: 11/04/2019    History of Present Illness 86 yo female admitted with AMS possibly due to UTI, hypotension, bradycardia, R foot pain. Hx of dementia, SVT, chronic pain, COVID, CKD, chronic diarrhea.   Clinical Impression   Mrs. Bianca [name] is a pleasant 86 year old woman who presents with dementia, complaints of right foot pain, fear of falling and decreased balance and activity tolerance. These factors and deficits result in decreased independence with ADLs and mobility and make the patient a high fall risk. Patient min guard to ambulate to bathroom with RW and perform toileting. Patient required verbal cues for sequencing of task but other wise could manage toileting. Patient returned to edge of bed and reports feeling fatigued after short activity. Patient reports persistent right foot pain but also reports her foot didn't hurt with shoes on. Patient will benefit from skilled OT services while in hospital in order to improve deficits in order to return to PLOF.     Follow Up Recommendations  No OT follow up    Equipment Recommendations  None recommended by OT    Recommendations for Other Services       Precautions / Restrictions Precautions Precautions: Fall Restrictions Weight Bearing Restrictions: No      Mobility Bed Mobility Overal bed mobility: Needs Assistance Bed Mobility: Supine to Sit     Supine to sit: Supervision;HOB elevated        Transfers Overall transfer level: Needs assistance Equipment used: Rolling walker (2 wheeled) Transfers: Sit to/from [facility] Sit to Stand: Min guard Stand pivot transfers: Min guard       General transfer comment: Min guard to ambulate with RW. Patient fearful of falling but does well with encouragement and words of affirmation with the use of the walker.    Balance Overall balance  assessment: Mild deficits observed, not formally tested         Standing balance support: Bilateral upper extremity supported Standing balance-Leahy Scale: Poor                             ADL either performed or assessed with clinical judgement   ADL Overall ADL's : Needs assistance/impaired Eating/Feeding: Independent   Grooming: Wash/dry hands;Standing;Min guard Grooming Details (indicate cue type and reason): wash hands at sink Upper Body Bathing: Set up;Sitting   Lower Body Bathing: Set up;Sit to/from stand;Min guard   Upper Body Dressing : Set up;Sitting   Lower Body Dressing: Set up;Sit to/from stand;Min guard Lower Body Dressing Details (indicate cue type and reason): Patient able to donn socks and shoes Toilet Transfer: Min guard;RW;Ambulation;Grab bars;Cueing for safety   Toileting- Clothing Manipulation and Hygiene: Min guard;Cueing for sequencing;Sit to/from stand       Functional mobility during ADLs: Min guard;Rolling walker       Vision   Vision Assessment?: No apparent visual deficits     Perception     Praxis      Pertinent Vitals/Pain Pain Assessment: Faces (Reports right foot has been paining her but didn't complain with shoes on) Pain Score: 5  Faces Pain Scale: Hurts a little bit Pain Location: R foot Pain Descriptors / Indicators: Discomfort;Sore Pain Intervention(s): Monitored during session     Hand Dominance     Extremity/Trunk Assessment Upper Extremity Assessment Upper Extremity Assessment: Overall WFL for tasks assessed   Lower Extremity Assessment Lower  Extremity Assessment: Defer to PT evaluation   Cervical / Trunk Assessment Cervical / Trunk Assessment: Kyphotic   Communication Communication Communication: No difficulties   Cognition Arousal/Alertness: Awake/alert Behavior During Therapy: WFL for tasks assessed/performed Overall Cognitive Status: History of cognitive impairments - at baseline                                  General Comments: hx of dementia,   General Comments       Exercises     Shoulder Instructions      Home Living Family/patient expects to be discharged to:: Assisted living     Type of Home: Assisted living Home Access: Level entry     Home Layout: One level                   Additional Comments: Unknown. Patient a questionable historian. Does report not using a device.      Prior Functioning/Environment Level of Independence: Independent                 OT Problem List: Decreased activity tolerance;Decreased safety awareness;Decreased cognition;Impaired balance (sitting and/or standing);Pain;Decreased knowledge of use of DME or AE      OT Treatment/Interventions: Self-care/ADL training;DME and/or AE instruction;Therapeutic activities;Balance training;Cognitive remediation/compensation;Patient/family education    OT Goals(Current goals can be found in the care plan section) Acute Rehab OT Goals Patient Stated Goal: I don't want to fall again OT Goal Formulation: With patient Time For Goal Achievement: 11/18/19 Potential to Achieve Goals: Fair  OT Frequency: Min 2X/week   Barriers to D/C:            Co-evaluation              AM-PAC OT "6 Clicks" Daily Activity     Outcome Measure Help from another person eating meals?: None Help from another person taking care of personal grooming?: A Little Help from another person toileting, which includes using toliet, bedpan, or urinal?: A Little Help from another person bathing (including washing, rinsing, drying)?: A Little Help from another person to put on and taking off regular upper body clothing?: A Little Help from another person to put on and taking off regular lower body clothing?: A Little 6 Click Score: 19   End of Session Equipment Utilized During Treatment: Gait belt;Rolling walker Nurse Communication:  (okay to see per RN)  Activity Tolerance: Patient  tolerated treatment well Patient left: in bed;with call bell/phone within reach;with bed alarm set  OT Visit Diagnosis: Unsteadiness on feet (R26.81);Pain Pain - Right/Left: Right Pain - part of body: Ankle and joints of foot                Time: 1520-1540 OT Time Calculation (min): 20 min Charges:  OT General Charges $OT Visit: 1 Visit OT Evaluation $OT Eval Low Complexity: 1 Low  Shontay Wallner, OTR/L [place]  Office [phone] Pager: [place] 11/04/2019, 4:53 PM

## 2019-11-04 NOTE — Evaluation (Signed)
Physical Therapy Evaluation Patient Details Name: Bianca [name] MRN: [id number] DOB: [birth year] Today's Date: 11/04/2019   History of Present Illness  86 yo female admitted with AMS possibly due to UTI, hypotension, bradycardia, R foot pain. Hx of dementia, SVT, chronic pain, COVID, CKD, chronic diarrhea.  Clinical Impression  On eval, pt required Min assist for mobility. She walked ~125 feet with a RW. Pt tolerated activity well. Minimal R foot pain with activity. Pt is fearful of falling and she has anxiety when mobilizing. No family present during session. Will follow during hospital stay.     Follow Up Recommendations Home health PT;Supervision/Assistance - 24 hour (at ALF as long as staff can provide current level of assistance)    Equipment Recommendations  Rolling walker with 5" wheels    Recommendations for Other Services       Precautions / Restrictions Precautions Precautions: Fall Restrictions Weight Bearing Restrictions: No      Mobility  Bed Mobility Overal bed mobility: Needs Assistance Bed Mobility: Supine to Sit     Supine to sit: Supervision;HOB elevated        Transfers Overall transfer level: Needs assistance Equipment used: Rolling walker (2 wheeled) Transfers: Sit to/from [facility] Sit to Stand: Min assist Stand pivot transfers: Min assist       General transfer comment: Assist to steady. Cues for safety, hand placement.  Ambulation/Gait Ambulation/Gait assistance: Min assist Gait Distance (Feet): 125 Feet Assistive device: Rolling walker (2 wheeled) Gait Pattern/deviations: Step-through pattern;Decreased stride length     General Gait Details: Assist to stabilize intermittently. Pt is fearful of falling-has anxiety about this.  Stairs            Wheelchair Mobility    Modified Rankin (Stroke Patients Only)       Balance Overall balance assessment: Needs assistance         Standing balance support:  Bilateral upper extremity supported Standing balance-Leahy Scale: Poor                               Pertinent Vitals/Pain Pain Assessment: Faces Faces Pain Scale: Hurts little more Pain Location: R foot Pain Descriptors / Indicators: Discomfort;Sore Pain Intervention(s): Monitored during session    Home Living Family/patient expects to be discharged to:: Unsure     Type of Home: Assisted living Home Access: Level entry     Home Layout: One level        Prior Function Level of Independence: Independent               Hand Dominance        Extremity/Trunk Assessment   Upper Extremity Assessment Upper Extremity Assessment: Defer to OT evaluation    Lower Extremity Assessment Lower Extremity Assessment: Generalized weakness    Cervical / Trunk Assessment Cervical / Trunk Assessment: Kyphotic  Communication   Communication: No difficulties  Cognition Arousal/Alertness: Awake/alert Behavior During Therapy: WFL for tasks assessed/performed Overall Cognitive Status: History of cognitive impairments - at baseline                                        General Comments      Exercises     Assessment/Plan    PT Assessment Patient needs continued PT services  PT Problem List Decreased mobility;Decreased strength;Decreased activity tolerance;Decreased balance;Decreased cognition;Pain  PT Treatment Interventions DME instruction;Gait training;Therapeutic activities;Therapeutic exercise;Patient/family education;Balance training;Functional mobility training    PT Goals (Current goals can be found in the Care Plan section)  Acute Rehab PT Goals Patient Stated Goal: to avoid falling PT Goal Formulation: With patient Time For Goal Achievement: 11/18/19 Potential to Achieve Goals: Good    Frequency Min 3X/week   Barriers to discharge        Co-evaluation               AM-PAC PT "6 Clicks" Mobility  Outcome  Measure Help needed turning from your back to your side while in a flat bed without using bedrails?: None Help needed moving from lying on your back to sitting on the side of a flat bed without using bedrails?: None Help needed moving to and from a bed to a chair (including a wheelchair)?: A Little Help needed standing up from a chair using your arms (e.g., wheelchair or bedside chair)?: A Little Help needed to walk in hospital room?: A Little Help needed climbing 3-5 steps with a railing? : A Little 6 Click Score: 20    End of Session Equipment Utilized During Treatment: Gait belt Activity Tolerance: Patient tolerated treatment well Patient left: in chair;with call bell/phone within reach;with chair alarm set   PT Visit Diagnosis: Muscle weakness (generalized) (M62.81);Unsteadiness on feet (R26.81)    Time: 0626-9485 PT Time Calculation (min) (ACUTE ONLY): 17 min   Charges:   PT Evaluation $PT Eval Low Complexity: [place], PT Acute Rehabilitation  Office: [phone] Pager: [phone]

## 2019-11-05 ENCOUNTER — Inpatient Hospital Stay (HOSPITAL_COMMUNITY): Payer: Medicare Other

## 2019-11-05 DIAGNOSIS — I6389 Other cerebral infarction: Secondary | ICD-10-CM | POA: Diagnosis not present

## 2019-11-05 DIAGNOSIS — I639 Cerebral infarction, unspecified: Secondary | ICD-10-CM

## 2019-11-05 LAB — URINE CULTURE: Culture: 60000 — AB

## 2019-11-05 LAB — ECHOCARDIOGRAM COMPLETE
Area-P 1/2: 3.21 cm2
Height: 60 in
P 1/2 time: 558 msec
S' Lateral: 2.01 cm
Weight: 1283.96 oz

## 2019-11-05 LAB — HEMOGLOBIN A1C
Hgb A1c MFr Bld: 5.9 % — ABNORMAL HIGH (ref 4.8–5.6)
Mean Plasma Glucose: 122.63 mg/dL

## 2019-11-05 MED ORDER — ASPIRIN 81 MG PO CHEW
81.0000 mg | CHEWABLE_TABLET | Freq: Every day | ORAL | Status: DC
  Administered 2019-11-05 – 2019-11-06 (×2): 81 mg via ORAL
  Filled 2019-11-05 (×2): qty 1

## 2019-11-05 MED ORDER — CEPHALEXIN 250 MG PO CAPS
250.0000 mg | ORAL_CAPSULE | Freq: Two times a day (BID) | ORAL | Status: AC
  Administered 2019-11-05 – 2019-11-06 (×4): 250 mg via ORAL
  Filled 2019-11-05 (×4): qty 1

## 2019-11-05 NOTE — Consult Note (Signed)
Neurology Consultation  Reason for Consult: Stroke  Referring Physician: Dr. Karleen Hampshire, [facility] hospitalist  CC: Altered mental status, stroke on MRI  History is obtained from: Chart review, patient  HPI: Bianca [name] is a 86 y.o. female past medical history of SVT on metoprolol, progressive cognitive impairment, brought in to [facility] hospital for evaluation of altered mental status from the facility.  She was found to be bradycardic with EKG showing sinus bradycardia followed by transient tachycardia in the setting of holding home beta-blockers.  She had a CT head done which was negative for acute process.  She did not have any focal neurological deficits on initial exam.  She was evaluated by therapies and their assessment is provided.  Found to have a UTI which was treated with improvement in mentation. An MRI of the brain was completed to evaluate for any other causes of acute altered mental status and it revealed evidence of acute infarction for which neurological consultation was obtained. Patient denies any tingling numbness.  She denies any focal weakness but reports her right leg has always been weak due to pain in her hip and legs.  Denies any visual changes.  Denies headaches.  Denies chest pain or shortness of breath at this time.  Denies abdominal pain, nausea vomiting.  Denies fevers chills or sick contacts. Work-up revealed a UTI. In the hospital, she was treated for a UTI.   She has shown improvement in her mentation and is aware of herself but not to place or time.  Follows all commands.  Otherwise exam nonfocal as below.  LKW: Sometime prior to presentation on 11/02/2019 tpa given?: no, outside the window Premorbid modified Rankin scale (mRS): Difficult to ascertain but somewhere around 1-2.   ROS: Performed and negative except as noted in HPI.  Past Medical History:  Diagnosis Date  . COVID-19   . 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 No family strokes  Social History:   reports that she has never smoked. She has never used smokeless tobacco. She reports that she does not drink alcohol and does not use drugs.  Medications  Current Facility-Administered Medications:  .  acetaminophen (TYLENOL) tablet 650 mg, 650 mg, Oral, Q6H PRN, 650 mg at 11/05/19 1210 **OR** acetaminophen (TYLENOL) suppository 650 mg, 650 mg, Rectal, Q6H PRN, Harold Hedge, MD .  aspirin chewable tablet 81 mg, 81 mg, Oral, Daily, Hosie Poisson, MD, 81 mg at 11/05/19 1048 .  bismuth subsalicylate (PEPTO BISMOL) 262 MG/15ML suspension 60 mL, 60 mL, Oral, Daily PRN, Harold Hedge, MD .  cephALEXin (KEFLEX) capsule 250 mg, 250 mg, Oral, Q12H, Hosie Poisson, MD, 250 mg at 11/05/19 1048 .  diphenoxylate-atropine (LOMOTIL) 2.5-0.025 MG/5ML liquid 5 mL, 5 mL, Oral, TID PRN, Harold Hedge, MD, 5 mL at 11/04/19 2125 .  heparin injection 5,000 Units, 5,000 Units, Subcutaneous, Q8H, Harold Hedge, MD, 5,000 Units at 11/05/19 2126 .  melatonin tablet 3 mg, 3 mg, Oral, QHS, Harold Hedge, MD, 3 mg at 11/05/19 2126 .  metoprolol tartrate (LOPRESSOR) injection 5 mg, 5 mg, Intravenous, Q5 min PRN, Harold Hedge, MD, 5 mg at 11/03/19 0103 .  pantoprazole (PROTONIX) EC tablet 40 mg, 40 mg, Oral, Daily, Harold Hedge, MD, 40 mg at 11/05/19 1048 .  timolol (TIMOPTIC) 0.5 % ophthalmic solution 1 drop, 1 drop, Both Eyes, Daily, Harold Hedge, MD, 1 drop at  11/05/19 1047   Exam: Current vital signs: BP (!) 117/55 (BP Location: Left Arm)   Pulse 56   Temp 98 F (36.7 C) (Oral)   Resp 18   Ht 5' (1.524 m)   Wt (!) 36.4 kg   SpO2 96%   BMI 15.67 kg/m  Vital signs in last 24 hours: Temp:  [97.8 F (36.6 C)-98 F (36.7 C)] 98 F (36.7 C) (07/29 2104) Pulse Rate:  [56-58] 56 (07/29 2104) Resp:  [18] 18 (07/29 2104) BP: (117)/(54-55) 117/55 (07/29 2104) SpO2:  [96 %-97 %] 96 % (07/29 2104) GENERAL:  Cachectic looking woman, awake, alert in NAD HEENT: - Normocephalic and atraumatic, dry mm, no LN++, no Thyromegally LUNGS - Clear to auscultation bilaterally with no wheezes CV - S1S2 RRR, no m/r/g, equal pulses bilaterally. ABDOMEN - Soft, nontender, nondistended with normoactive BS Ext: warm, well perfused, intact peripheral pulses, no edema  NEURO:  Mental Status: Awake alert oriented to self.  Could not tell me the date month or place. Language: speech is nondysarthric naming, repetition, fluency, and comprehension intact although she has poor attention concentration. Cranial Nerves: PERRL. EOMI, visual fields full, no facial asymmetry facial sensation intact, hearing mildly reduced bilaterally, tongue/uvula/soft palate midline, normal sternocleidomastoid and trapezius muscle strength. No evidence of tongue atrophy or fibrillations Motor: Antigravity in all fours with the exception of some painful hesitancy in the right hip flexion but it is at least a 4+/5 even with the pain.  No vertical drift. Tone: is normal and bulk is normal Sensation- Intact to light touch bilaterally without any extinction Coordination: No dysmetria Gait- deferred  NIHSS-2-for LOC questions.   Labs I have reviewed labs in epic and the results pertinent to this consultation are:  CBC    Component Value Date/Time   WBC 6.8 11/03/2019 0454   RBC 4.94 11/03/2019 0454   HGB 14.9 11/03/2019 0454   HCT 47.4 (H) 11/03/2019 0454   PLT 230 11/03/2019 0454   MCV 96.0 11/03/2019 0454   MCH 30.2 11/03/2019 0454   MCHC 31.4 11/03/2019 0454   RDW 15.0 11/03/2019 0454   LYMPHSABS 0.8 04/26/2019 0414   MONOABS 0.5 04/26/2019 0414   EOSABS 0.1 04/26/2019 0414   BASOSABS 0.0 04/26/2019 0414    CMP     Component Value Date/Time   NA 141 11/04/2019 0535   NA 139 10/26/2019 0000   K 3.6 11/04/2019 0535   CL 107 11/04/2019 0535   CO2 24 11/04/2019 0535   GLUCOSE 92 11/04/2019 0535   BUN 25 (H) 11/04/2019 0535    BUN 24 (A) 10/26/2019 0000   CREATININE 0.86 11/04/2019 0535   CALCIUM 8.2 (L) 11/04/2019 0535   PROT 6.1 (L) 11/03/2019 0454   ALBUMIN 3.5 11/03/2019 0454   AST 32 11/03/2019 0454   ALT 18 11/03/2019 0454   ALKPHOS 57 11/03/2019 0454   BILITOT 0.9 11/03/2019 0454   GFRNONAA 57 (L) 11/04/2019 0535   GFRAA >60 11/04/2019 0535   A1c-5.9 LDL-lipid panel pending  2D echocardiogram-11/05/2019 Normal EF Normal LA size No interatrial shunt  Full report impression pasted below IMPRESSIONS  1. Left ventricular ejection fraction, by estimation, is 60 to 65%. The  left ventricle has normal function. The left ventricle has no regional  wall motion abnormalities. There is mild left ventricular hypertrophy.  Left ventricular diastolic parameters  are consistent with Grade II diastolic dysfunction (pseudonormalization).  Elevated left atrial pressure.  2. Right ventricular systolic function is normal. The right ventricular  size is normal. There is mildly elevated pulmonary artery systolic  pressure. The estimated right ventricular systolic pressure is 56.2 mmHg.  3. Right atrial size was mildly dilated.  4. The mitral valve is normal in structure. Trivial mitral valve  regurgitation.  5. Tricuspid valve regurgitation is moderate to severe.  6. The aortic valve is tricuspid. Aortic valve regurgitation is mild.  Mild aortic valve sclerosis is present, with no evidence of aortic valve  stenosis.  7. The inferior vena cava is normal in size with greater than 50%  respiratory variability, suggesting right atrial pressure of 3 mmHg.   Carotid Dopplers pending   Imaging I have reviewed the images obtained:  CT-scan of the brain-no acute changes  MRI examination of the brain-several scattered small acute infarcts in the posterior left hemisphere in the left PCA and left PCA/MCA watershed area.  No associated hemorrhage or mass-effect.  Underlying chronic white matter changes  likely due to small vessel disease. MRA head with intact cerebral vasculature without stenosis.  Assessment: 86 year old with above past medical history presenting for evaluation of altered mental status, noted to have a UTI.  A MRI done to complete the work-up for altered mental status revealed infarctions in the left PCA and left PCA MCA watershed territory. MRA head with no intracranial stenosis. 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 fib thus far. Cervical vasculature imaging pending.  Recommendations: Stroke work-up underway.  Cervical vasculature imaging-carotid Dopplers pending, lipid panel pending Stroke prophylaxis-on aspirin.  Continue as outpatient. Pending lipid panel-goal LDL less than 70. Therapy evaluations completed. Unless there is significant stenosis noted in the left cervical carotid, this remains an embolic stroke of unknown source. She will need outpatient 30-day cardiac monitoring.  Neurology will follow with you. -- Amie Portland, MD [facility] Pager: [phone] If 7pm to 7am, please call on call as listed on AMION.

## 2019-11-05 NOTE — Progress Notes (Signed)
PROGRESS NOTE    Bianca [name]  YQM:[id number] DOB: 12/18/1922 DOA: 11/02/2019 PCP: [name], [name], NP    Chief Complaint  Patient presents with  . Weakness    Brief Narrative:   86 year old lady with prior history of SVT on metoprolol, GERD presents from ALF with altered mental status.  On arrival to ED she was found to be bradycardic.  EKG shows sinus bradycardia.  Hospital course was complicated by transient tachycardia in the setting of holding home beta-blockers for paroxysmal SVT. Currently she remains bradycardic and asymptomatic from the bradycardia.  She also underwent CT head without contrast initially, which was negative for stroke. She did not have any focal decifits.  PT evaluation recommending Home health PT. SLP evaluation recommending dysphagia 3 diet with thin liquid. In view of her acute confusion , MRI of the brain was done, showed multiple acute strokes in the left hemisphere.  Neurology consulted, requested transfer the patient to [facility]. Discussed with the patient's nephew who agreed for the transfer. Stroke work up ordered and is pending.    Assessment & Plan:   Principal Problem:   Altered mental status Active Problems:   Paroxysmal SVT (supraventricular tachycardia) (HCC)   CKD (chronic kidney disease) stage 3, GFR 30-59 ml/min   Sinus bradycardia   Acute lower UTI   Tachycardia   Hypotension   Left hemisphere stroke: -  MRI brain showed Several scattered small acute infarcts in the posterior left hemisphere, The Left PCA and Left MCA/PCA watershed area. No associated hemorrhage or mass effect.  Underlying chronic white matter signal changes most likely due to small vessel disease. No focal deficits.  Stroke work up ordered. MRA head ordered. Carotid duplex , echocardiogram ordered.  A1c is  Lipid panel. PT evaluation recommending home health PT with 24 hours supervision.  SLP eval recommending dysphagia 3 diet  Neurology consulted and plan to transfer the  patient to [facility] for further evaluation by neurology.  Dr Cheral Marker will see the patient when she arrives at [facility].   Acute encephalopathy probably secondary to urinary tract infection and medications.  TSH within normal limits Vitamin B12 and ammonia are within normal limits Patient was started on IV Rocephin for urinary tract infection. Urine cultures grew 60,000 E. Coli sensitive to keflex. Transitioned to keflex to complete the course.   Hypotension: Probably secondary to decreased oral intake versus dehydration from infection .  Avoid hypotension.    Paroxysmal SVT Currently heart rate is in low 50s continue to hold beta-blocker as blood pressure parameters have been borderline.  Continue to monitor on telemetry.    Bradycardia Asymptomatic. Continue to hold beta-blocker.   GERD Stable.     DVT prophylaxis: Heparin Code Status: DNR  family Communication: None at bedside discussed with nephew over the phone.  Disposition:   Status is: Inpatient  Remains inpatient appropriate because:IV treatments appropriate due to intensity of illness or inability to take PO   Dispo: The patient is from: ALF              Anticipated d/c is to: ALF              Anticipated d/c date is: 1 day              Patient currently is not medically stable to d/c.       Consultants:   Neurology.   Procedures: None Antimicrobials:  Anti-infectives (From admission, onward)   Start     Dose/Rate Route Frequency  Ordered Stop   11/05/19 1400  cefTRIAXone (ROCEPHIN) 1 g in sodium chloride 0.9 % 100 mL IVPB  Status:  Discontinued        1 g 200 mL/hr over 30 Minutes Intravenous Every 24 hours 11/04/19 1611 11/05/19 0915   11/05/19 1015  cephALEXin (KEFLEX) capsule 250 mg     Discontinue     250 mg Oral Every 12 hours 11/05/19 0916 11/07/19 0959   11/03/19 1500  cefTRIAXone (ROCEPHIN) 1 g in sodium chloride 0.9 % 100 mL IVPB        1 g 200 mL/hr over 30 Minutes Intravenous Every 24 hours  11/02/19 1739 11/04/19 2225   11/02/19 1445  cefTRIAXone (ROCEPHIN) 1 g in sodium chloride 0.9 % 100 mL IVPB        1 g 200 mL/hr over 30 Minutes Intravenous  Once 11/02/19 1437 11/02/19 1548       Subjective: [place] and answering questions appropriately.   Objective: Vitals:   11/04/19 0555 11/04/19 1306 11/04/19 2119 11/05/19 1302  BP: (!) 109/50 (!) 94/48 (!) 110/58 (!) 117/54  Pulse: (!) 42 52 56 58  Resp: 20 18 18 18   Temp: 97.6 F (36.4 C) 98 F (36.7 C) 98.3 F (36.8 C) 97.8 F (36.6 C)  TempSrc: Oral Oral Oral Oral  SpO2: 100% 96% 95% 97%  Weight:      Height:        Intake/Output Summary (Last 24 hours) at 11/05/2019 1323 Last data filed at 11/05/2019 1312 Gross per 24 hour  Intake 480 ml  Output 2 ml  Net 478 ml   Filed Weights   11/02/19 1014 11/02/19 2355  Weight: (!) 39.9 kg (!) 36.4 kg    Examination:  General exam: alert and comfortable.  Respiratory system: clear to auscultation , no wheezing or rhonchi,  Cardiovascular system: S1S2 heard, bradycardic, no JVD, no pedal edema.  Gastrointestinal system: Abdomen is soft, non tender non distended bowel sounds wnl.  Central nervous system: alert and oriented to person and place only. No focal deficits.   extremities: No pedal edema.  Skin: No rashes seen.  Psychiatry: Mood is appropriate.    Data Reviewed: I have personally reviewed following labs and imaging studies  CBC: Recent Labs  Lab 11/02/19 1045 11/03/19 0454  WBC 8.6 6.8  HGB 15.6* 14.9  HCT 48.9* 47.4*  MCV 94.6 96.0  PLT 250 258    Basic Metabolic Panel: Recent Labs  Lab 11/02/19 1045 11/03/19 0454 11/04/19 0535  NA 139 143 141  K 4.5 4.1 3.6  CL 96* 103 107  CO2 28 22 24   GLUCOSE 99 76 92  BUN 33* 32* 25*  CREATININE 1.14* 1.02* 0.86  CALCIUM 9.4 8.5* 8.2*  MG  --  2.3  --     GFR: Estimated Creatinine Clearance: 22.5 mL/min (by C-G formula based on SCr of 0.86 mg/dL).  Liver Function Tests: Recent Labs    Lab 11/02/19 1045 11/03/19 0454  AST 34 32  ALT 19 18  ALKPHOS 64 57  BILITOT 0.8 0.9  PROT 6.9 6.1*  ALBUMIN 4.0 3.5    CBG: No results for input(s): GLUCAP in the last 168 hours.   Recent Results (from the past 240 hour(s))  Urine culture     Status: Abnormal   Collection Time: 11/02/19 10:47 AM   Specimen: Urine, Random  Result Value Ref Range Status   Specimen Description   Final    URINE, RANDOM Performed  at [facility], [place] [address] [place], [place] [address]    Special Requests   Final    NONE Performed at [facility], [place] [address], [place], [place] [address]    Culture 60,000 COLONIES/mL ESCHERICHIA COLI (A)  Final   Report Status 11/05/2019 FINAL  Final   Organism ID, Bacteria ESCHERICHIA COLI (A)  Final      Susceptibility   Escherichia coli - MIC*    AMPICILLIN >=32 RESISTANT Resistant     CEFAZOLIN 16 SENSITIVE Sensitive     CEFTRIAXONE <=0.25 SENSITIVE Sensitive     CIPROFLOXACIN <=0.25 SENSITIVE Sensitive     GENTAMICIN <=1 SENSITIVE Sensitive     IMIPENEM <=0.25 SENSITIVE Sensitive     NITROFURANTOIN 32 SENSITIVE Sensitive     TRIMETH/SULFA >=320 RESISTANT Resistant     AMPICILLIN/SULBACTAM >=32 RESISTANT Resistant     PIP/TAZO <=4 SENSITIVE Sensitive     * 60,000 COLONIES/mL ESCHERICHIA COLI  Blood culture (routine x 2)     Status: None (Preliminary result)   Collection Time: 11/02/19  2:38 PM   Specimen: BLOOD  Result Value Ref Range Status   Specimen Description   Final    BLOOD RIGHT ANTECUBITAL Performed at [facility], [place] [address], [place], [place] [address]    Special Requests   Final    BOTTLES DRAWN AEROBIC ONLY Blood Culture adequate volume Performed at [place] [address] [place], [place] [address]    Culture   Final    NO GROWTH 3 DAYS Performed at [facility], [place] [address], [place], [place] [address]    Report Status PENDING  Incomplete  SARS  Coronavirus 2 by RT PCR (hospital order, performed in [facility] hospital lab) Nasopharyngeal Nasopharyngeal Swab     Status: None   Collection Time: 11/02/19  2:40 PM   Specimen: Nasopharyngeal Swab  Result Value Ref Range Status   SARS Coronavirus 2 NEGATIVE NEGATIVE Final    Comment: (NOTE) SARS-CoV-2 target nucleic acids are NOT DETECTED.  The SARS-CoV-2 RNA is generally detectable in upper and lower respiratory specimens during the acute phase of infection. The lowest concentration of SARS-CoV-2 viral copies this assay can detect is 250 copies / mL. A negative result does not preclude SARS-CoV-2 infection and should not be used as the sole basis for treatment or other patient management decisions.  A negative result may occur with improper specimen collection / handling, submission of specimen other than nasopharyngeal swab, presence of viral mutation(s) within the areas targeted by this assay, and inadequate number of viral copies (<250 copies / mL). A negative result must be combined with clinical observations, patient history, and epidemiological information.  Fact Sheet for Patients:   StrictlyIdeas.no  Fact Sheet for Healthcare Providers: BankingDealers.co.za  This test is not yet approved or  cleared by the [place] FDA and has been authorized for detection and/or diagnosis of SARS-CoV-2 by FDA under an Emergency Use Authorization (EUA).  This EUA will remain in effect (meaning this test can be used) for the duration of the COVID-19 declaration under Section 564(b)(1) of the Act, 21 U.S.C. section 360bbb-3(b)(1), unless the authorization is terminated or revoked sooner.  Performed at [facility], [place] [address], [place], [place] [address]   MRSA PCR Screening     Status: None   Collection Time: 11/04/19  2:02 PM   Specimen: Nasal Mucosa; Nasopharyngeal  Result Value Ref Range Status  MRSA by  PCR NEGATIVE NEGATIVE Final    Comment:        The GeneXpert MRSA Assay (FDA approved for NASAL specimens only), is one component of a comprehensive MRSA colonization surveillance program. It is not intended to diagnose MRSA infection nor to guide or monitor treatment for MRSA infections. Performed at [facility], [place] [address] [place], [place] [address]          Radiology Studies: MR BRAIN WO CONTRAST  Result Date: 11/04/2019 CLINICAL DATA:  86 year old female with altered mental status for 2 days. EXAM: MRI HEAD WITHOUT CONTRAST TECHNIQUE: Multiplanar, multiecho pulse sequences of the brain and surrounding structures were obtained without intravenous contrast. COMPARISON:  Head CT 11/02/2019. FINDINGS: Brain: Patchy, scattered restricted diffusion in the posterior left hemisphere seen in the inferior left parietal lobe cortex (series 5, image 81), left periatrial white matter approaching the splenium of the corpus callosum (image 77), and occipital lobe white matter (image 70). Associated mild T2 and FLAIR hyperintensity in these areas with no evidence of hemorrhage. No mass effect. No other restricted diffusion. Patchy bilateral periventricular white matter T2 and FLAIR hyperintensity with some areas most resembling chronic white matter lacunar infarcts. But no chronic cortical encephalomalacia identified. No chronic cerebral blood products. No midline shift, mass effect, evidence of mass lesion, ventriculomegaly, extra-axial collection or acute intracranial hemorrhage. Cervicomedullary junction and pituitary are within normal limits. Vascular: Major intracranial vascular flow voids are preserved. Skull and upper cervical spine: Partially visible cervical spine degeneration including ligamentous hypertrophy about the odontoid. Visualized bone marrow signal is within normal limits. Sinuses/Orbits: Postoperative changes to both globes. No acute findings. Other: Mild left  mastoid effusion.  Visible pharynx appears normal. IMPRESSION: 1. 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 No associated hemorrhage or mass effect. 2. Underlying chronic white matter signal changes most likely due to small vessel disease. 3. Mild left mastoid effusion, likely postinflammatory. Electronically Signed   By: Genevie Ann M.D.   On: 11/04/2019 20:53        Scheduled Meds: . aspirin  81 mg Oral Daily  . cephALEXin  250 mg Oral Q12H  . heparin  5,000 Units Subcutaneous Q8H  . melatonin  3 mg Oral QHS  . pantoprazole  40 mg Oral Daily  . timolol  1 drop Both Eyes Daily   Continuous Infusions:   LOS: 3 days       Hosie Poisson, MD [facility]   To contact the attending provider between 7A-7P or the covering provider during after hours 7P-7A, please log into the web site [email or web address] and access using universal [place] password for that web site. If you do not have the password, please call the hospital operator.  11/05/2019, 1:23 PM

## 2019-11-05 NOTE — Progress Notes (Signed)
  Echocardiogram 2D Echocardiogram has been performed.  Shae Augello G Joelynn Dust 11/05/2019, 2:09 PM

## 2019-11-05 NOTE — TOC Progression Note (Signed)
Transition of Care [facility]) - Progression Note    Patient Details  Name: Bianca [name] MRN: [id number] Date of Birth: Aug 14, 1922  Transition of Care [facility]) CM/SW Contact  Eleaner Dibartolo, Juliann Pulse, RN Phone Number: 11/05/2019, 12:13 PM  Clinical Narrative: Received call from [facility] confirmed ALF level per [facility] [place] rep-they have concerns of patient returning back. Noted patient for transfer to [facility].      Expected Discharge Plan: Assisted Living Barriers to Discharge: Continued Medical Work up  Expected Discharge Plan and Services Expected Discharge Plan: Assisted Living   Discharge Planning Services: CM Consult Post Acute Care Choice:  (ALF) Living arrangements for the past 2 months: Assisted Living Facility                                       Social Determinants of Health (SDOH) Interventions    Readmission Risk Interventions No flowsheet data found.

## 2019-11-05 NOTE — Care Management Important Message (Signed)
Important Message  Patient Details IM Letter given to the Patient Name: Bianca [name] MRN: [id number] Date of Birth: 1923/01/07   Medicare Important Message Given:  Yes     Kerin Salen 11/05/2019, 10:14 AM

## 2019-11-05 NOTE — NC FL2 (Signed)
[place] LEVEL OF CARE SCREENING TOOL     IDENTIFICATION  Patient Name: Bianca [name] Birthdate: 04-12-1922 Sex: female Admission Date (Current Location): 11/02/2019  [facility] and [place] Number:  Herbalist and Address:  [facility],  [place] [address] [place]      Provider Number: [id number]  Attending Physician Name and Address:  Hosie Poisson, MD  Relative Name and Phone Number:  Liliane Channel nephew [phone]    Current Level of Care: Hospital Recommended Level of Care: [place] Prior Approval Number:    Date Approved/Denied:   PASRR Number:    Discharge Plan: Other (Comment) (ALF)    Current Diagnoses: Patient Active Problem List   Diagnosis Date Noted  . Tachycardia 11/03/2019  . Hypotension 11/03/2019  . Altered mental status 11/02/2019  . Sinus bradycardia 11/02/2019  . Acute lower UTI 11/02/2019  . Memory deficit 10/26/2019  . CKD (chronic kidney disease) stage 3, GFR 30-59 ml/min 09/30/2019  . Edema 06/11/2019  . Metatarsalgia 06/11/2019  . GERD (gastroesophageal reflux disease) 05/14/2019  . Paroxysmal SVT (supraventricular tachycardia) ([place]) 04/27/2019  . Pneumonia due to COVID-19 virus 04/27/2019  . Chronic diarrhea 04/27/2019  . Urinary frequency 04/27/2019  . Protein-calorie malnutrition ([place]) 04/27/2019  . Emphysema of lung ([place]) 04/27/2019    Orientation RESPIRATION BLADDER Height & Weight     Self  Normal Continent Weight: (!) 36.4 kg Height:  5' (152.4 cm)  BEHAVIORAL SYMPTOMS/MOOD NEUROLOGICAL BOWEL NUTRITION STATUS      Continent Diet (Soft)  AMBULATORY STATUS COMMUNICATION OF NEEDS Skin   Limited Assist Verbally Normal                       Personal Care Assistance Level of Assistance  Bathing, Feeding, Dressing Bathing Assistance: Limited assistance Feeding assistance: Limited assistance Dressing Assistance: Limited assistance     Functional Limitations Info   Sight, Speech, Hearing Sight Info: Impaired Hearing Info: Adequate Speech Info: Adequate    SPECIAL CARE FACTORS FREQUENCY  PT (By licensed PT), OT (By licensed OT)     PT Frequency: 3x week OT Frequency: 3x week            Contractures Contractures Info: Not present    Additional Factors Info  Code Status, Allergies Code Status Info: DNR Allergies Info: Azopt, Brimonidine, Lumigan           Current Medications (11/05/2019):  This is the current hospital active medication list Current Facility-Administered Medications  Medication Dose Route Frequency Provider Last Rate Last Admin  . acetaminophen (TYLENOL) tablet 650 mg  650 mg Oral Q6H PRN Harold Hedge, MD   650 mg at 11/03/19 5093   Or  . acetaminophen (TYLENOL) suppository 650 mg  650 mg Rectal Q6H PRN Harold Hedge, MD      . aspirin chewable tablet 81 mg  81 mg Oral Daily Hosie Poisson, MD   81 mg at 11/05/19 1048  . bismuth subsalicylate (PEPTO BISMOL) 262 MG/15ML suspension 60 mL  60 mL Oral Daily PRN Harold Hedge, MD      . cephALEXin [facility]) capsule 250 mg  250 mg Oral Q12H Hosie Poisson, MD   250 mg at 11/05/19 1048  . diphenoxylate-atropine (LOMOTIL) 2.5-0.025 MG/5ML liquid 5 mL  5 mL Oral TID PRN Harold Hedge, MD   5 mL at 11/04/19 2125  . heparin injection 5,000 Units  5,000 Units Subcutaneous Q8H Marva Panda  E, MD   5,000 Units at 11/05/19 0455  . melatonin tablet 3 mg  3 mg Oral QHS Harold Hedge, MD   3 mg at 11/04/19 2121  . metoprolol tartrate (LOPRESSOR) injection 5 mg  5 mg Intravenous Q5 min PRN Harold Hedge, MD   5 mg at 11/03/19 0103  . pantoprazole (PROTONIX) EC tablet 40 mg  40 mg Oral Daily Harold Hedge, MD   40 mg at 11/05/19 1048  . timolol (TIMOPTIC) 0.5 % ophthalmic solution 1 drop  1 drop Both Eyes Daily Harold Hedge, MD   1 drop at 11/05/19 1047     Discharge Medications: Please see discharge summary for a list of discharge medications.  Relevant Imaging  Results:  Relevant Lab Results:   Additional Information SS#[phone]  Tully Burgo, Juliann Pulse, RN

## 2019-11-05 NOTE — Progress Notes (Signed)
Physical Therapy Treatment Patient Details Name: Bianca [name] MRN: [id number] DOB: January 19, 1923 Today's Date: 11/05/2019    History of Present Illness 86 yo female admitted with AMS possibly due to UTI, hypotension, bradycardia, R foot pain. Hx of dementia, SVT, chronic pain, COVID, CKD, chronic diarrhea.  MRI: Several scattered small acute infarcts in the posterior left hemisphere, The Left PCA and Left MCA/PCA watershed area    PT Comments    Pt reports right foot pain.  Socks removed and pt has calluses over first metatarsal heads (worse on right).  Assisted pt with donning shoes for more comfort.  Pt very fearful of falling however reassured with min/guard assist and gait belt.  Dr. Karleen Hampshire into room to tell pt about several small left infarcts and transfer to [facility] end of session.   Follow Up Recommendations  Home health PT;Supervision/Assistance - 24 hour     Equipment Recommendations  Rolling walker with 5" wheels    Recommendations for Other Services       Precautions / Restrictions Precautions Precautions: Fall Precaution Comments: pt very fearful of falling Restrictions Weight Bearing Restrictions: No Other Position/Activity Restrictions: pt wil bil 1st metatarsal head calluses and pain with right so wear shoes    Mobility  Bed Mobility Overal bed mobility: Needs Assistance Bed Mobility: Supine to Sit     Supine to sit: Supervision;HOB elevated        Transfers Overall transfer level: Needs assistance Equipment used: Rolling walker (2 wheeled) Transfers: Sit to/from Stand Sit to Stand: Min guard         General transfer comment: min/guard for safety, cues for hand placement  Ambulation/Gait Ambulation/Gait assistance: Min guard Gait Distance (Feet): 160 Feet Assistive device: Rolling walker (2 wheeled) Gait Pattern/deviations: Step-through pattern;Decreased stride length;Narrow base of support     General Gait Details: min/guard for safety, pt  occasionally catching shoe on shoe due to narrow BOS, very fearful of falling however more at ease with occasional reassurance of gait belt   Stairs             Wheelchair Mobility    Modified Rankin (Stroke Patients Only) Modified Rankin (Stroke Patients Only) Pre-Morbid Rankin Score: Moderate disability Modified Rankin: Moderately severe disability     Balance                                            Cognition Arousal/Alertness: Awake/alert Behavior During Therapy: WFL for tasks assessed/performed Overall Cognitive Status: History of cognitive impairments - at baseline                                 General Comments: hx of dementia      Exercises      General Comments        Pertinent Vitals/Pain Pain Assessment: 0-10 Pain Score: 5  Pain Location: R 1st metatarsal head (callus) with pressure Pain Descriptors / Indicators: Discomfort;Sore;Tender Pain Intervention(s): Repositioned;Monitored during session;Other (comment) (notified RN)    Home Living                      Prior Function            PT Goals (current goals can now be found in the care plan section) Progress towards PT goals: Progressing toward goals  Frequency    Min 3X/week      PT Plan Current plan remains appropriate    Co-evaluation              AM-PAC PT "6 Clicks" Mobility   Outcome Measure  Help needed turning from your back to your side while in a flat bed without using bedrails?: None Help needed moving from lying on your back to sitting on the side of a flat bed without using bedrails?: None Help needed moving to and from a bed to a chair (including a wheelchair)?: A Little Help needed standing up from a chair using your arms (e.g., wheelchair or bedside chair)?: A Little Help needed to walk in hospital room?: A Little Help needed climbing 3-5 steps with a railing? : A Little 6 Click Score: 20    End of Session  Equipment Utilized During Treatment: Gait belt Activity Tolerance: Patient tolerated treatment well Patient left: in chair;with call bell/phone within reach;with chair alarm set Nurse Communication: Mobility status PT Visit Diagnosis: Muscle weakness (generalized) (M62.81);Unsteadiness on feet (R26.81)     Time: 7471-5953 PT Time Calculation (min) (ACUTE ONLY): 16 min  Charges:  $Gait Training: 8-22 mins                     Arlyce Dice, DPT Acute Rehabilitation Services Pager: [phone] Office: [phone]  York Ram E 11/05/2019, 10:58 AM

## 2019-11-06 ENCOUNTER — Encounter (HOSPITAL_COMMUNITY): Payer: PRIVATE HEALTH INSURANCE

## 2019-11-06 ENCOUNTER — Inpatient Hospital Stay (HOSPITAL_COMMUNITY): Payer: Medicare Other

## 2019-11-06 DIAGNOSIS — I639 Cerebral infarction, unspecified: Secondary | ICD-10-CM

## 2019-11-06 LAB — CBC
HCT: 41.1 % (ref 36.0–46.0)
Hemoglobin: 12.8 g/dL (ref 12.0–15.0)
MCH: 30.3 pg (ref 26.0–34.0)
MCHC: 31.1 g/dL (ref 30.0–36.0)
MCV: 97.4 fL (ref 80.0–100.0)
Platelets: 206 10*3/uL (ref 150–400)
RBC: 4.22 MIL/uL (ref 3.87–5.11)
RDW: 15.1 % (ref 11.5–15.5)
WBC: 4.3 10*3/uL (ref 4.0–10.5)
nRBC: 0 % (ref 0.0–0.2)

## 2019-11-06 LAB — COMPREHENSIVE METABOLIC PANEL
ALT: 18 U/L (ref 0–44)
AST: 34 U/L (ref 15–41)
Albumin: 2.9 g/dL — ABNORMAL LOW (ref 3.5–5.0)
Alkaline Phosphatase: 45 U/L (ref 38–126)
Anion gap: 8 (ref 5–15)
BUN: 9 mg/dL (ref 8–23)
CO2: 23 mmol/L (ref 22–32)
Calcium: 8.1 mg/dL — ABNORMAL LOW (ref 8.9–10.3)
Chloride: 107 mmol/L (ref 98–111)
Creatinine, Ser: 0.72 mg/dL (ref 0.44–1.00)
GFR calc Af Amer: >60 mL/min (ref >60)
GFR calc non Af Amer: >60 mL/min (ref >60)
Glucose, Bld: 93 mg/dL (ref 70–99)
Potassium: 3.9 mmol/L (ref 3.5–5.1)
Sodium: 138 mmol/L (ref 135–145)
Total Bilirubin: 0.3 mg/dL (ref 0.3–1.2)
Total Protein: 5.1 g/dL — ABNORMAL LOW (ref 6.5–8.1)

## 2019-11-06 LAB — LIPID PANEL
Cholesterol: 138 mg/dL (ref 0–200)
HDL: 49 mg/dL (ref >40)
LDL Cholesterol: 72 mg/dL (ref 0–99)
Total CHOL/HDL Ratio: 2.8 RATIO
Triglycerides: 86 mg/dL (ref <150)
VLDL: 17 mg/dL (ref 0–40)

## 2019-11-06 MED ORDER — ASPIRIN 81 MG PO CHEW: 81.0000 mg | CHEWABLE_TABLET | Freq: Every day | ORAL | Status: DC

## 2019-11-06 MED ORDER — SODIUM CHLORIDE 0.9 % IV BOLUS
250.0000 mL | Freq: Once | INTRAVENOUS | Status: AC
  Administered 2019-11-06: 250 mL via INTRAVENOUS

## 2019-11-06 MED ORDER — APIXABAN 2.5 MG PO TABS
2.5000 mg | ORAL_TABLET | Freq: Two times a day (BID) | ORAL | Status: DC
  Administered 2019-11-06 – 2019-11-12 (×13): 2.5 mg via ORAL
  Filled 2019-11-06 (×13): qty 1

## 2019-11-06 MED ORDER — DIGOXIN 0.25 MG/ML IJ SOLN
0.2500 mg | Freq: Once | INTRAMUSCULAR | Status: AC
  Administered 2019-11-06: 0.25 mg via INTRAVENOUS
  Filled 2019-11-06: qty 2

## 2019-11-06 MED ORDER — ATORVASTATIN CALCIUM 40 MG PO TABS: 40.0000 mg | ORAL_TABLET | Freq: Every day | ORAL | Status: DC

## 2019-11-06 MED ORDER — ATORVASTATIN CALCIUM 20 MG PO TABS
20.0000 mg | ORAL_TABLET | Freq: Every day | ORAL | Status: DC
  Administered 2019-11-06 – 2019-11-11 (×6): 20 mg via ORAL
  Filled 2019-11-06 (×6): qty 1

## 2019-11-06 MED ORDER — AMIODARONE HCL 200 MG PO TABS
200.0000 mg | ORAL_TABLET | Freq: Every day | ORAL | Status: DC
  Administered 2019-11-06 – 2019-11-09 (×4): 200 mg via ORAL
  Filled 2019-11-06 (×4): qty 1

## 2019-11-06 MED ORDER — CLOPIDOGREL BISULFATE 75 MG PO TABS
75.0000 mg | ORAL_TABLET | Freq: Every day | ORAL | Status: DC
  Administered 2019-11-06: 75 mg via ORAL
  Filled 2019-11-06: qty 1

## 2019-11-06 MED ORDER — POTASSIUM CHLORIDE 20 MEQ PO PACK
40.0000 meq | PACK | Freq: Once | ORAL | Status: AC
  Administered 2019-11-06: 40 meq via ORAL
  Filled 2019-11-06: qty 2

## 2019-11-06 MED ORDER — ASPIRIN 325 MG PO TABS: 325.0000 mg | ORAL_TABLET | Freq: Every day | ORAL | Status: DC

## 2019-11-06 MED ORDER — DILTIAZEM HCL 25 MG/5ML IV SOLN
5.0000 mg | Freq: Once | INTRAVENOUS | Status: AC
  Administered 2019-11-06: 5 mg via INTRAVENOUS
  Filled 2019-11-06: qty 5

## 2019-11-06 NOTE — Plan of Care (Signed)
Discussed with Dr. Karleen Hampshire over the phone.  Bianca [name] was seen by Dr. Malen Gauze last night at [facility].  86 year old female with history of SVT on beta-blocker, dementia presented for altered mental status, UTI, and bradycardia tachycardia syndrome.  CT no acute abnormality.  MRI showed left PCA patchy infarcts.  MRA negative, EF 60 to 65%.  Carotid Doppler unremarkable.  LDL 72 and A1c 5.9.  Had cardiology consult with Dr. 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 fib RVR episodes.  Patient stroke consistent with embolic stroke, given her A. fib/a flutter, agree with Dr. Einar Gip to start anticoagulation with Eliquis.  Aspirin can be discontinued.  Continue Lipitor 20.  No high-dose statin due to her advanced age and relatively low LDL level.  Patient will follow up with Dr. Leonie Man at [facility] in 4 weeks.    As per Dr. Karleen Hampshire, patient currently neuro intact.  Patient does not need to be transferred to [facility].  Neurology will sign off. Please call with questions. Thanks for the consult.  Bianca Hawking, MD PhD Stroke Neurology 11/06/2019 6:46 PM

## 2019-11-06 NOTE — Discharge Instructions (Signed)

## 2019-11-06 NOTE — Progress Notes (Signed)
Carotid duplex has been completed.   Preliminary results in CV Proc.   Abram Sander 11/06/2019 3:03 PM

## 2019-11-06 NOTE — TOC Progression Note (Addendum)
Transition of Care [facility]) - Progression Note    Patient Details  Name: Bianca [name] MRN: [id number] Date of Birth: 01/21/1923  Transition of Care [facility]) CM/SW Contact  Rhapsody Wolven, Juliann Pulse, RN Phone Number: 11/06/2019, 11:09 AM  Clinical Narrative:11:34a-[facility] rep Joellen Jersey not comfortable with patient returning per PT recc-HHPT;gait belt;24hr supv-facility does not have the staff to provide services they recc SNF. Informed MD/nsg.Please have PT to see patient.   TC [facility]-awaiting response for return-noted PT recc HHPT. Spoke to son [name]-d/c plan for return back to [facility]. Await covid results.      Expected Discharge Plan: Assisted Living Barriers to Discharge: Other (comment) (awaiting confirmation for return back to ALF w/[facility].)  Expected Discharge Plan and Services Expected Discharge Plan: Assisted Living   Discharge Planning Services: CM Consult Post Acute Care Choice:  (ALF) Living arrangements for the past 2 months: Assisted Living Facility                                       Social Determinants of Health (SDOH) Interventions    Readmission Risk Interventions No flowsheet data found.

## 2019-11-06 NOTE — Progress Notes (Signed)
PROGRESS NOTE    Bianca [name]  EGB:[id number] DOB: November 05, 1922 DOA: 11/02/2019 PCP: [name], [name], NP    Chief Complaint  Patient presents with  . Weakness    Brief Narrative:   86 year old lady with prior history of SVT on metoprolol, GERD presents from ALF with altered mental status.  On arrival to ED she was found to be bradycardic.  EKG shows sinus bradycardia.  Hospital course was complicated by transient tachycardia in the setting of holding home beta-blockers for paroxysmal SVT. Currently she remains bradycardic and asymptomatic from the bradycardia.  She also underwent CT head without contrast initially, which was negative for stroke. She did not have any focal decifits.  PT evaluation recommending Home health PT. SLP evaluation recommending dysphagia 3 diet with thin liquid. In view of her acute confusion , MRI of the brain was done, showed multiple acute strokes in the left hemisphere.  Neurology consulted, requested transfer the patient to [facility]. Discussed with the patient's nephew who agreed for the transfer. Stroke work up ordered and is pending. Overnight pt had an episode of atrial tachycardia requiring dig and diltiazem. Currently she is bradycardic and asymptomatic.    Assessment & Plan:   Principal Problem:   Altered mental status Active Problems:   Paroxysmal SVT (supraventricular tachycardia) (HCC)   CKD (chronic kidney disease) stage 3, GFR 30-59 ml/min   Sinus bradycardia   Acute lower UTI   Tachycardia   Hypotension   Stroke (HCC)   Left hemisphere stroke: -  MRI brain showed Several scattered small acute infarcts in the posterior left hemisphere, The Left PCA and Left MCA/PCA watershed area. No associated hemorrhage or mass effect.  Underlying chronic white matter signal changes most likely due to small vessel disease. Possibly an embolic stroke.  Cardiology consulted for evaluation of atrial fibrillation. No focal deficits.  Stroke work up ordered. MRA head  ordered and appears to wnl Carotid duplex is pending at this time.   echocardiogram ordered.  showed Left ventricular ejection fraction, by estimation, is 60 to 65%. The left ventricle has normal function. The left ventricle has no regional wall motion abnormalities. There is mild left ventricular hypertrophy. Left ventricular diastolic parameters are consistent with Grade II diastolic dysfunction (pseudonormalization). Elevated left atrial pressure. A1c is 5.9% lipid panel shows LDL of 72, started her on Lipitor 20 mg daily.  Patient was started on aspirin 81 mg for stroke prophylaxis. PT evaluation recommending home health PT with 24 hours supervision, repeat PT evaluation to see if she will qualify for SNF SLP eval recommending dysphagia 3 diet  Neurology consulted and recommendations given.  Acute encephalopathy probably secondary to urinary tract infection and medications and stroke.  TSH within normal limits Vitamin B12 and ammonia are within normal limits Patient was started on IV Rocephin for urinary tract infection. Urine cultures grew 60,000 E. Coli sensitive to keflex. Transitioned to keflex to complete the course.   Hypotension: Probably secondary to decreased oral intake versus dehydration from infection .  Resolved.   Paroxysmal SVT Currently heart rate is in low 50s continue to hold beta-blocker as blood pressure parameters have been borderline.  Continue to monitor on telemetry.    Tachybradycardia syndrome/atrial tachycardia Asymptomatic. Overnight patient had a heart rate of 130/min and she was given a dose of dig and diltiazem and currently she is bradycardic.  Cardiology consulted recommended low-dose anticoagulation with Eliquis 2.5 mg twice daily.   GERD Stable.   Mild AKI Probably secondary to dehydration  Patient came in with a creatinine of 1.1 improved to 0.7 with IV fluids.     DVT prophylaxis: Eliquis Code Status: DNR  family Communication: None at  bedside discussed with nephew over the phone.  Disposition:   Status is: Inpatient  Remains inpatient appropriate because:IV treatments appropriate due to intensity of illness or inability to take PO   Dispo: The patient is from: ALF              Anticipated d/c is to: ALF              Anticipated d/c date is: 1 day              Patient currently is not medically stable to d/c.       Consultants:   Neurology.   Cardiology Dr. Einar Gip  Procedures: None Antimicrobials:  Anti-infectives (From admission, onward)   Start     Dose/Rate Route Frequency Ordered Stop   11/05/19 1400  cefTRIAXone (ROCEPHIN) 1 g in sodium chloride 0.9 % 100 mL IVPB  Status:  Discontinued        1 g 200 mL/hr over 30 Minutes Intravenous Every 24 hours 11/04/19 1611 11/05/19 0915   11/05/19 1015  cephALEXin (KEFLEX) capsule 250 mg     Discontinue     250 mg Oral Every 12 hours 11/05/19 0916 11/07/19 0959   11/03/19 1500  cefTRIAXone (ROCEPHIN) 1 g in sodium chloride 0.9 % 100 mL IVPB        1 g 200 mL/hr over 30 Minutes Intravenous Every 24 hours 11/02/19 1739 11/04/19 2225   11/02/19 1445  cefTRIAXone (ROCEPHIN) 1 g in sodium chloride 0.9 % 100 mL IVPB        1 g 200 mL/hr over 30 Minutes Intravenous  Once 11/02/19 1437 11/02/19 1548       Subjective: Patient is alert following simple commands and appears comfortable.  Objective: Vitals:   11/06/19 0254 11/06/19 0537 11/06/19 0858 11/06/19 1216  BP: (!) 100/53 (!) 123/59 (!) 111/57 (!) 116/63  Pulse: 45 45 47 45  Resp: 16 18 14 16   Temp: 97.9 F (36.6 C) 97.6 F (36.4 C) 98 F (36.7 C) 97.9 F (36.6 C)  TempSrc: Oral Oral Oral Oral  SpO2: 97% 98% 97% 97%  Weight:      Height:        Intake/Output Summary (Last 24 hours) at 11/06/2019 1314 Last data filed at 11/06/2019 1225 Gross per 24 hour  Intake 561.39 ml  Output --  Net 561.39 ml   Filed Weights   11/02/19 1014 11/02/19 2355  Weight: (!) 39.9 kg (!) 36.4 kg     Examination:  General exam: Alert and comfortable, not in any kind of distress. Respiratory system: Clear to auscultation bilaterally, no wheezing or rhonchi Cardiovascular system: S1-S2 heard, bradycardic, no JVD, no pedal edema Gastrointestinal system: Abdomen is soft, nontender, nondistended, bowel sounds normal Central nervous system: Alert and oriented to person only, grossly normal  extremities: No pedal edema Skin: No rashes seen.  Psychiatry: Patient is pleasantly confused and mood is appropriate.   Data Reviewed: I have personally reviewed following labs and imaging studies  CBC: Recent Labs  Lab 11/02/19 1045 11/03/19 0454 11/06/19 0544  WBC 8.6 6.8 4.3  HGB 15.6* 14.9 12.8  HCT 48.9* 47.4* 41.1  MCV 94.6 96.0 97.4  PLT 250 230 774    Basic Metabolic Panel: Recent Labs  Lab 11/02/19 1045 11/03/19 0454 11/04/19 0535 11/06/19 0544  NA 139 143 141 138  K 4.5 4.1 3.6 3.9  CL 96* 103 107 107  CO2 28 22 24 23   GLUCOSE 99 76 92 93  BUN 33* 32* 25* 9  CREATININE 1.14* 1.02* 0.86 0.72  CALCIUM 9.4 8.5* 8.2* 8.1*  MG  --  2.3  --   --     GFR: Estimated Creatinine Clearance: 24.2 mL/min (by C-G formula based on SCr of 0.72 mg/dL).  Liver Function Tests: Recent Labs  Lab 11/02/19 1045 11/03/19 0454 11/06/19 0544  AST 34 32 34  ALT 19 18 18   ALKPHOS 64 57 45  BILITOT 0.8 0.9 0.3  PROT 6.9 6.1* 5.1*  ALBUMIN 4.0 3.5 2.9*    CBG: No results for input(s): GLUCAP in the last 168 hours.   Recent Results (from the past 240 hour(s))  Urine culture     Status: Abnormal   Collection Time: 11/02/19 10:47 AM   Specimen: Urine, Random  Result Value Ref Range Status   Specimen Description   Final    URINE, RANDOM Performed at [facility], [address], [place], [place] [address]    Special Requests   Final    NONE Performed at [facility], [place] [address] [place], [place] [address]    Culture 60,000 COLONIES/mL  ESCHERICHIA COLI (A)  Final   Report Status 11/05/2019 FINAL  Final   Organism ID, Bacteria ESCHERICHIA COLI (A)  Final      Susceptibility   Escherichia coli - MIC*    AMPICILLIN >=32 RESISTANT Resistant     CEFAZOLIN 16 SENSITIVE Sensitive     CEFTRIAXONE <=0.25 SENSITIVE Sensitive     CIPROFLOXACIN <=0.25 SENSITIVE Sensitive     GENTAMICIN <=1 SENSITIVE Sensitive     IMIPENEM <=0.25 SENSITIVE Sensitive     NITROFURANTOIN 32 SENSITIVE Sensitive     TRIMETH/SULFA >=320 RESISTANT Resistant     AMPICILLIN/SULBACTAM >=32 RESISTANT Resistant     PIP/TAZO <=4 SENSITIVE Sensitive     * 60,000 COLONIES/mL ESCHERICHIA COLI  Blood culture (routine x 2)     Status: None (Preliminary result)   Collection Time: 11/02/19  2:38 PM   Specimen: BLOOD  Result Value Ref Range Status   Specimen Description   Final    BLOOD RIGHT ANTECUBITAL Performed at [facility], [place] [address], [place], [place] [address]    Special Requests   Final    BOTTLES DRAWN AEROBIC ONLY Blood Culture adequate volume Performed at [place] [address] [place], [place] [address]    Culture   Final    NO GROWTH 3 DAYS Performed at [facility], [place] [address], [place],  [address]    Report Status PENDING  Incomplete  SARS Coronavirus 2 by RT PCR (hospital order, performed in [facility] hospital lab) Nasopharyngeal Nasopharyngeal Swab     Status: None   Collection Time: 11/02/19  2:40 PM   Specimen: Nasopharyngeal Swab  Result Value Ref Range Status   SARS Coronavirus 2 NEGATIVE NEGATIVE Final    Comment: (NOTE) SARS-CoV-2 target nucleic acids are NOT DETECTED.  The SARS-CoV-2 RNA is generally detectable in upper and lower respiratory specimens during the acute phase of infection. The lowest concentration of SARS-CoV-2 viral copies this assay can detect is 250 copies / mL. A negative result does not preclude SARS-CoV-2 infection and should not be used as the sole  basis for treatment or other patient management decisions.  A negative result may occur with improper specimen collection / handling, submission of specimen other than nasopharyngeal swab, presence of viral mutation(s) within the areas targeted by this assay, and inadequate number of viral copies (<250 copies / mL). A negative result must be combined with clinical observations, patient history, and epidemiological information.  Fact Sheet for Patients:   StrictlyIdeas.no  Fact Sheet for Healthcare Providers: BankingDealers.co.za  This test is not yet approved or  cleared by the [place] FDA and has been authorized for detection and/or diagnosis of SARS-CoV-2 by FDA under an Emergency Use Authorization (EUA).  This EUA will remain in effect (meaning this test can be used) for the duration of the COVID-19 declaration under Section 564(b)(1) of the Act, 21 U.S.C. section 360bbb-3(b)(1), unless the authorization is terminated or revoked sooner.  Performed at [facility], [place] [address], [place], [place] [address]   MRSA PCR Screening     Status: None   Collection Time: 11/04/19  2:02 PM   Specimen: Nasal Mucosa; Nasopharyngeal  Result Value Ref Range Status   MRSA by PCR NEGATIVE NEGATIVE Final    Comment:        The GeneXpert MRSA Assay (FDA approved for NASAL specimens only), is one component of a comprehensive MRSA colonization surveillance program. It is not intended to diagnose MRSA infection nor to guide or monitor treatment for MRSA infections. Performed at [facility], [place] [address], [place], [place] [address]          Radiology Studies: MR ANGIO HEAD WO CONTRAST  Result Date: 11/05/2019 CLINICAL DATA:  Initial evaluation for acute stroke. EXAM: MRA HEAD WITHOUT CONTRAST TECHNIQUE: Angiographic images of the Circle of Willis were obtained using MRA technique without  intravenous contrast. COMPARISON:  Prior brain MRI from 11/04/2019. FINDINGS: ANTERIOR CIRCULATION: Visualized distal cervical segments of the internal carotid arteries are widely patent with symmetric antegrade flow. Petrous, cavernous, and supraclinoid ICAs widely patent without stenosis or other abnormality. Origin of the ophthalmic arteries patent. ICA termini well perfused. A1 segments patent bilaterally. Normal anterior communicating artery complex. Anterior cerebral arteries widely patent to their distal aspects without stenosis. No M1 stenosis or occlusion. Normal MCA bifurcations. Distal MCA branches well perfused and symmetric. POSTERIOR CIRCULATION: Vertebral arteries widely patent to the vertebrobasilar junction without stenosis. Left vertebral artery dominant. Partially visualized posterior inferior cerebral arteries patent bilaterally. Basilar widely patent to its distal aspect without stenosis. Superior cerebral arteries patent bilaterally. Both PCAs primarily supplied via the basilar well perfused to their distal aspects. No intracranial aneurysm or other vascular abnormality. IMPRESSION: Normal intracranial MRA. No large vessel occlusion, hemodynamically significant stenosis, or other vascular abnormality. No aneurysm. Electronically Signed   By: Jeannine Boga M.D.   On: 11/05/2019 19:49   MR BRAIN WO CONTRAST  Result Date: 11/04/2019 CLINICAL DATA:  86 year old female with altered mental status for 2 days. EXAM: MRI HEAD WITHOUT CONTRAST TECHNIQUE: Multiplanar, multiecho pulse sequences of the brain and surrounding structures were obtained without intravenous contrast. COMPARISON:  Head CT 11/02/2019. FINDINGS: Brain: Patchy, scattered restricted diffusion in the posterior left hemisphere seen in the inferior left parietal lobe cortex (series 5, image 81), left periatrial white matter approaching the splenium of the corpus callosum (image 77), and occipital lobe white matter (image 70).  Associated mild T2 and FLAIR hyperintensity in these areas with no evidence of hemorrhage. No mass effect. No other restricted diffusion. Patchy bilateral periventricular white matter T2 and FLAIR hyperintensity with some areas  most resembling chronic white matter lacunar infarcts. But no chronic cortical encephalomalacia identified. No chronic cerebral blood products. No midline shift, mass effect, evidence of mass lesion, ventriculomegaly, extra-axial collection or acute intracranial hemorrhage. Cervicomedullary junction and pituitary are within normal limits. Vascular: Major intracranial vascular flow voids are preserved. Skull and upper cervical spine: Partially visible cervical spine degeneration including ligamentous hypertrophy about the odontoid. Visualized bone marrow signal is within normal limits. Sinuses/Orbits: Postoperative changes to both globes. No acute findings. Other: Mild left mastoid effusion.  Visible pharynx appears normal. IMPRESSION: 1. 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 No associated hemorrhage or mass effect. 2. Underlying chronic white matter signal changes most likely due to small vessel disease. 3. Mild left mastoid effusion, likely postinflammatory. Electronically Signed   By: Genevie Ann M.D.   On: 11/04/2019 20:53   ECHOCARDIOGRAM COMPLETE  Result Date: 11/05/2019    ECHOCARDIOGRAM REPORT   Patient Name:   Bianca [name] Date of Exam: 11/05/2019 Medical Rec #:  [id number]        Height:       60.0 in Accession #:    [id number]       Weight:       80.2 lb Date of Birth:  1922-08-26         BSA:          1.266 m Patient Age:    89 years         BP:           117/54 mmHg Patient Gender: F                HR:           52 bpm. Exam Location:  Inpatient Procedure: 2D Echo, Cardiac Doppler and Color Doppler Indications:    COVID-19  History:        Patient has no prior history of Echocardiogram examinations.   Sonographer:    Jonelle Sidle Dance Referring Phys: Lewie Chamber Ayannah Faddis IMPRESSIONS  1. Left ventricular ejection fraction, by estimation, is 60 to 65%. The left ventricle has normal function. The left ventricle has no regional wall motion abnormalities. There is mild left ventricular hypertrophy. Left ventricular diastolic parameters are consistent with Grade II diastolic dysfunction (pseudonormalization). Elevated left atrial pressure.  2. Right ventricular systolic function is normal. The right ventricular size is normal. There is mildly elevated pulmonary artery systolic pressure. The estimated right ventricular systolic pressure is 85.4 mmHg.  3. Right atrial size was mildly dilated.  4. The mitral valve is normal in structure. Trivial mitral valve regurgitation.  5. Tricuspid valve regurgitation is moderate to severe.  6. The aortic valve is tricuspid. Aortic valve regurgitation is mild. Mild aortic valve sclerosis is present, with no evidence of aortic valve stenosis.  7. The inferior vena cava is normal in size with greater than 50% respiratory variability, suggesting right atrial pressure of 3 mmHg. FINDINGS  Left Ventricle: Left ventricular ejection fraction, by estimation, is 60 to 65%. The left ventricle has normal function. The left ventricle has no regional wall motion abnormalities. The left ventricular internal cavity size was small. There is mild left ventricular hypertrophy. Left ventricular diastolic parameters are consistent with Grade II diastolic dysfunction (pseudonormalization). Elevated left atrial pressure. Right Ventricle: The right ventricular size is normal. Right vetricular wall thickness was not assessed. Right ventricular systolic function is normal. There is mildly elevated pulmonary artery systolic pressure.  The tricuspid regurgitant velocity is 2.97 m/s, and with an assumed right atrial pressure of 3 mmHg, the estimated right ventricular systolic pressure is 40.9 mmHg. Left Atrium: Left  atrial size was normal in size. Right Atrium: Right atrial size was mildly dilated. Pericardium: Trivial pericardial effusion is present. Mitral Valve: The mitral valve is normal in structure. Trivial mitral valve regurgitation. Tricuspid Valve: The tricuspid valve is normal in structure. Tricuspid valve regurgitation is moderate to severe. Aortic Valve: The aortic valve is tricuspid. Aortic valve regurgitation is mild. Aortic regurgitation PHT measures 558 msec. Mild aortic valve sclerosis is present, with no evidence of aortic valve stenosis. Pulmonic Valve: The pulmonic valve was grossly normal. Pulmonic valve regurgitation is trivial. Aorta: The aortic root and ascending aorta are structurally normal, with no evidence of dilitation. Venous: The inferior vena cava is normal in size with greater than 50% respiratory variability, suggesting right atrial pressure of 3 mmHg. IAS/Shunts: The interatrial septum was not well visualized.  LEFT VENTRICLE PLAX 2D LVIDd:         3.21 cm LVIDs:         2.01 cm LV PW:         1.15 cm LV IVS:        0.90 cm LVOT diam:     1.70 cm LV SV:         47 LV SV Index:   37 LVOT Area:     2.27 cm  RIGHT VENTRICLE          IVC RV Basal diam:  2.88 cm  IVC diam: 1.49 cm RV Mid diam:    1.94 cm TAPSE (M-mode): 1.8 cm LEFT ATRIUM             Index       RIGHT ATRIUM           Index LA diam:        3.20 cm 2.53 cm/m  RA Area:     13.90 cm LA Vol (A2C):   44.4 ml 35.07 ml/m RA Volume:   34.40 ml  27.17 ml/m LA Vol (A4C):   35.0 ml 27.64 ml/m LA Biplane Vol: 40.9 ml 32.30 ml/m  AORTIC VALVE LVOT Vmax:   94.80 cm/s LVOT Vmean:  63.000 cm/s LVOT VTI:    0.205 m AI PHT:      558 msec  AORTA Ao Root diam: 2.80 cm Ao Asc diam:  2.70 cm MITRAL VALVE                TRICUSPID VALVE MV Area (PHT): 3.21 cm     TR Peak grad:   35.3 mmHg MV Decel Time: 236 msec     TR Vmax:        297.00 cm/s MV E velocity: 102.00 cm/s MV A velocity: 68.30 cm/s   SHUNTS MV E/A ratio:  1.49         Systemic VTI:   0.20 m                             Systemic Diam: 1.70 cm Oswaldo Milian MD Electronically signed by Oswaldo Milian MD Signature Date/Time: 11/05/2019/5:59:08 PM    Final         Scheduled Meds: . apixaban  2.5 mg Oral BID  . [START ON 11/07/2019] aspirin  81 mg Oral Daily  . atorvastatin  20 mg Oral q1800  . cephALEXin  250 mg Oral Q12H  .  melatonin  3 mg Oral QHS  . pantoprazole  40 mg Oral Daily  . timolol  1 drop Both Eyes Daily   Continuous Infusions:   LOS: 4 days       Hosie Poisson, MD [facility]   To contact the attending provider between 7A-7P or the covering provider during after hours 7P-7A, please log into the web site [email or web address] and access using universal  password for that web site. If you do not have the password, please call the hospital operator.  11/06/2019, 1:14 PM

## 2019-11-06 NOTE — Consult Note (Signed)
CARDIOLOGY CONSULT NOTE  Patient ID: Bianca [name] MRN: [id number] DOB/AGE: 84-Oct-1924 84 y.o.  Admit date: 11/02/2019 Referring Physician  Hosie Poisson, MD Primary Physician:  [name], [name], NP Reason for Consultation  SVT, Bradycardia  Patient ID: Bianca [name], female    DOB: 02-27-1923, 86 y.o.   MRN: [id number]  Chief Complaint  Patient presents with  . Weakness   HPI:    Bianca [name]  is a 86 y.o. Caucasian female with paroxysmal SVT, mild dementia, lives in assisted living place, admitted with altered mental status and labile heart rate.  She was found to have episodes of tachycardia while holding the beta-blockers, is also being treated for UTI, MRI of the brain revealing acute infarction involving several vascular distribution suggestive of cardioembolic phenomena.  I was consulted to manage her tachycardia-bradycardia syndrome.  Patient presently is resting in bed, is oriented and is appropriately speaking to me.  States that she has not had any chest pain or shortness of breath, denies any symptoms of palpitations but states that she has been told to have elevated heart rate at times.  She denies frequent falls, denies bloody stools.  She has 2 grand nephews and one sister but states that she does not contact them often.  She is worried about her disposition after she gets out of the hospital as she thinks her place in the assisted living facility has been taken over by somebody else as she left the place.  Past Medical History:  Diagnosis Date  . COVID-19   . 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 No pertinent surgical history. Social History   Socioeconomic History  . Marital status: Widowed    Spouse name: Not on file  . Number of children: Not on file  . Years of education: Not on file  . Highest education level: Not on file  Occupational History  . Not on file  Tobacco Use  .  Smoking status: Never Smoker  . Smokeless tobacco: Never Used  Vaping Use  . Vaping Use: Never used  Substance and Sexual Activity  . Alcohol use: Never  . Drug use: Never  . Sexual activity: Not on file  Other Topics Concern  . Not on file  Social History Narrative  . Not on file   Social Determinants of Health   Financial Resource Strain:   . Difficulty of Paying Living Expenses:   Food Insecurity:   . Worried About Charity fundraiser in the Last Year:   . Arboriculturist in the Last Year:   Transportation Needs:   . Film/video editor (Medical):   Marland Kitchen Lack of Transportation (Non-Medical):   Physical Activity:   . Days of Exercise per Week:   . Minutes of Exercise per Session:   Stress:   . Feeling of Stress :   Social Connections:   . Frequency of Communication with Friends and Family:   . Frequency of Social Gatherings with Friends and Family:   . Attends Religious Services:   . Active Member of Clubs or Organizations:   . Attends Archivist Meetings:   Marland Kitchen Marital Status:   Intimate Partner Violence:   . Fear of Current or Ex-Partner:   . Emotionally Abused:   Marland Kitchen Physically Abused:   . Sexually Abused:    ROS  Review of Systems  Cardiovascular: Negative for chest pain, dyspnea on exertion and leg swelling.  Gastrointestinal: Negative for melena.  All other systems reviewed and are negative.  Objective   Vitals with BMI 11/06/2019 11/06/2019 11/06/2019  Height - - -  Weight - - -  BMI - - -  Systolic 329 924 268  Diastolic 63 57 59  Pulse 45 47 45    Blood pressure (!) 116/63, pulse 45, temperature 97.9 F (36.6 C), temperature source Oral, resp. rate 16, height 5' (1.524 m), weight (!) 36.4 kg, SpO2 97 %.    Physical Exam Constitutional:      Comments: Elderly frail woman in no acute distress.  HENT:     Head: Atraumatic.  Cardiovascular:     Rate and Rhythm: Normal rate and regular rhythm.     Pulses: Intact distal pulses.     Heart  sounds: Normal heart sounds. No murmur heard.  No gallop.      Comments: No leg edema, no JVD. Pulmonary:     Effort: Pulmonary effort is normal.     Breath sounds: Normal breath sounds.  Abdominal:     General: Bowel sounds are normal.     Palpations: Abdomen is soft.  Musculoskeletal:     Cervical back: Neck supple.  Skin:    General: Skin is warm and dry.  Neurological:     Mental Status: She is oriented to person, place, and time.  Psychiatric:        Mood and Affect: Mood normal.    Laboratory examination:   Recent Labs    11/03/19 0454 11/04/19 0535 11/06/19 0544  NA 143 141 138  K 4.1 3.6 3.9  CL 103 107 107  CO2 22 24 23   GLUCOSE 76 92 93  BUN 32* 25* 9  CREATININE 1.02* 0.86 0.72  CALCIUM 8.5* 8.2* 8.1*  GFRNONAA 47* 57* >60  GFRAA 54* >60 >60   estimated creatinine clearance is 24.2 mL/min (by C-G formula based on SCr of 0.72 mg/dL).  CMP Latest Ref Rng & Units 11/06/2019 11/04/2019 11/03/2019  Glucose 70 - 99 mg/dL 93 92 76  BUN 8 - 23 mg/dL 9 25(H) 32(H)  Creatinine 0.44 - 1.00 mg/dL 0.72 0.86 1.02(H)  Sodium 135 - 145 mmol/L 138 141 143  Potassium 3.5 - 5.1 mmol/L 3.9 3.6 4.1  Chloride 98 - 111 mmol/L 107 107 103  CO2 22 - 32 mmol/L 23 24 22   Calcium 8.9 - 10.3 mg/dL 8.1(L) 8.2(L) 8.5(L)  Total Protein 6.5 - 8.1 g/dL 5.1(L) - 6.1(L)  Total Bilirubin 0.3 - 1.2 mg/dL 0.3 - 0.9  Alkaline Phos 38 - 126 U/L 45 - 57  AST 15 - 41 U/L 34 - 32  ALT 0 - 44 U/L 18 - 18   CBC Latest Ref Rng & Units 11/06/2019 11/03/2019 11/02/2019  WBC 4.0 - 10.5 K/uL 4.3 6.8 8.6  Hemoglobin 12.0 - 15.0 g/dL 12.8 14.9 15.6(H)  Hematocrit 36 - 46 % 41.1 47.4(H) 48.9(H)  Platelets 150 - 400 K/uL 206 230 250   Lipid Panel Recent Labs    11/06/19 0544  CHOL 138  TRIG 86  LDLCALC 72  VLDL 17  HDL 49  CHOLHDL 2.8    HEMOGLOBIN A1C Lab Results  Component Value Date   HGBA1C 5.9 (H) 11/04/2019   MPG 122.63 11/04/2019   TSH Recent Labs    11/02/19 1040  TSH 1.629    BNP (last 3 results) No results for input(s): BNP in the last 8760 hours.  Medications and allergies   Allergies  Allergen  Reactions  . Azopt [Brinzolamide] Other (See Comments)    UNK  . Brimonidine Other (See Comments)    UNK  . Lumigan [Bimatoprost] Other (See Comments)    UNK     Current Outpatient Medications  Medication Instructions  . acetaminophen (TYLENOL) 650 mg, Oral, Every 4 hours PRN  . b complex vitamins tablet 1 tablet, Oral, Daily  . bismuth subsalicylate (PEPTO BISMOL) 262 MG/15ML suspension 60 mLs, Oral, Daily PRN  . Calcium Carbonate-Vitamin D 600-400 MG-UNIT tablet 1 tablet, Oral, Daily  . diphenoxylate-atropine (LOMOTIL) 2.5-0.025 MG/5ML liquid Oral, 3 times daily PRN  . melatonin 3 mg, Oral, Daily at bedtime  . memantine (NAMENDA) 5 mg, Oral, Daily  . metoprolol tartrate (LOPRESSOR) 25 mg, Oral, 2 times daily  . metoprolol tartrate (LOPRESSOR) 12.5 mg, Oral,  Once  . Multiple Vitamins-Minerals (CENTRUM SILVER PO) 1 tablet, Oral, Daily, 7am-10am.  . NON FORMULARY Moisturizing Cream to extremities with morning and evening careEvery Shift  . NON FORMULARY RegularSpecial Instructions: Regular Diet, Thin Liquids  . omeprazole (PRILOSEC) 20 mg, Oral, Daily  . potassium chloride SA (KLOR-CON) 20 MEQ tablet 20 mEq, Oral, Daily  . timolol (BETIMOL) 0.5 % ophthalmic solution 1 drop, Both Eyes, Daily, 7am-10am.  . torsemide (DEMADEX) 20 mg, Oral, Daily    Scheduled Meds: . amiodarone  200 mg Oral Daily  . apixaban  2.5 mg Oral BID  . atorvastatin  20 mg Oral q1800  . cephALEXin  250 mg Oral Q12H  . melatonin  3 mg Oral QHS  . pantoprazole  40 mg Oral Daily  . timolol  1 drop Both Eyes Daily   Continuous Infusions: PRN Meds:.acetaminophen **OR** acetaminophen, bismuth subsalicylate, diphenoxylate-atropine, metoprolol tartrate   I/O last 3 completed shifts: In: 801.4 [P.O.:480; IV Piggyback:321.4] Out: 2 [Urine:1; Stool:1] Total I/O In: 240  [P.O.:240] Out: -     Radiology:   MR of the brain and MR angio of the head without contrast 11/05/2019: IMPRESSION: 1. 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 No associated hemorrhage or mass effect. 2. Underlying chronic white matter signal changes most likely due to small vessel disease. 3. Mild left mastoid effusion, likely postinflammatory. Electronically Signed   By: Genevie Ann M.D.   On: 11/04/2019 20:53   IMPRESSION: Normal intracranial MRA. No large vessel occlusion, hemodynamically significant stenosis, or other vascular abnormality. No aneurysm. Electronically Signed   By: Jeannine Boga M.D.   On: 11/05/2019 19:49   Cardiac Studies:   Carotid artery duplex  11/06/2019: Right Carotid: Velocities in the right ICA are consistent with a 1-39%  stenosis.   Left Carotid: Velocities in the left ICA are consistent with a 1-39%  stenosis.   Vertebrals: Bilateral vertebral arteries demonstrate antegrade flow.  Echocardiogram 10/27/2019:  1. Left ventricular ejection fraction, by estimation, is 60 to 65%. The  left ventricle has normal function. The left ventricle has no regional  wall motion abnormalities. There is mild left ventricular hypertrophy.  Left ventricular diastolic parameters  are consistent with Grade II diastolic dysfunction (pseudonormalization).  Elevated left atrial pressure.   2. Right ventricular systolic function is normal. The right ventricular  size is normal. There is mildly elevated pulmonary artery systolic  pressure. The estimated right ventricular systolic pressure is 65.4 mmHg.   3. Right atrial size was mildly dilated.   4. The mitral valve is normal in structure. Trivial mitral valve  regurgitation.   5. Tricuspid  valve regurgitation is moderate to severe.   6. The aortic valve is tricuspid. Aortic valve regurgitation is mild.  Mild aortic valve sclerosis is present, with no  evidence of aortic valve  stenosis.   7. The inferior vena cava is normal in size with greater than 50%  respiratory variability, suggesting right atrial pressure of 3 mmHg.   EKG:  EKG 11/03/2019: Marked sinus bradycardia at rate of 43 bpm, otherwise normal EKG.  EKG 11/06/2019: Atypical atrial flutter with 2: 1 conduction, ventricular rate 128 bpm.  Left axis deviation, left anterior fascicular block.  Poor R wave progression, cannot exclude anterolateral infarct old.  Tachycardia associated NSVT, suggests lateral ischemia.  Assessment   1.  Atypical atrial flutter with 2: 1 conduction CHA2DS2-VASc Score is 6.  Yearly risk of stroke: 9.8% (A, F, HTN, CVA).  Score of 1=0.6; 2=2.2; 3=3.2; 4=4.8; 5=7.2; 6=9.8; 7=>9.8) -(CHF; HTN; vasc disease DM,  Female = 1; Age <65 =0; 65-74 = 1,  >75 =2; stroke/embolism= 2).   2.  Abnormal EKG, demand lateral ischemia with a flutter with RVR 3.  Hypertension 4.  Embolic CVA  Recommendations:   Very pleasant 86 year old Caucasian female who is presently in bed, is alert and oriented, converses appropriately and who is with a sitter for fear of self inflicted injury/suicidal ideation.    I reviewed her noninvasive work-up, she has underlying sinus bradycardia related to beta-blocker use and episodes of a flutter with RVR.  Would probably recommend starting her on amiodarone to maintain sinus rhythm which will improve with chronic diastolic heart failure and grade 2 diastolic dysfunction and can be discharged home on 100 mg daily.  Discontinue metoprolol tartrate.  Can manage her hypertension with nondihydropyridine calcium channel blockers and hydralazine if necessary.  In view of high cardioembolic risk, would recommend starting her on Eliquis 2.5 mg p.o. twice daily.  She does have an abnormal EKG suggestive of lateral wall ischemia during A. fib with RVR however would not recommend ischemic work-up.  Discontinue aspirin to reduce the risk of bleed as she  will be on Eliquis.  I will be happy to see her back in the office if necessary.  I would watch her over the weekend with amiodarone on board to exclude significant bradycardic episodes.  I do not see that in her, patient in fact is extremely cautious and states that she tries to be careful not to fall and uses her walker frequently and does not walk if there is nobody around.  She does not seem to want to harm herself, she is concerned about returning back to assisted living facility as she thinks somebody may have already taken her spot.  I do not think she is at risk of suicide.   Adrian Prows, MD, [facility] 11/06/2019, 6:30 PM Office: [phone]

## 2019-11-06 NOTE — Progress Notes (Signed)
Occupational Therapy Treatment Patient Details Name: LAHELA WOODIN MRN: [id number] DOB: [birth year] Today's Date: 11/06/2019    History of present illness 86 yo female admitted with AMS possibly due to UTI, hypotension, bradycardia, R foot pain. Hx of dementia, SVT, chronic pain, COVID, CKD, chronic diarrhea.  MRI: Several scattered small acute infarcts in the posterior left hemisphere, The Left PCA and Left MCA/PCA watershed area   OT comments  Patient continues to need min guard today with ambulation and toileting but less fearful about falling. Patient required verbal cues to sequence task.    Follow Up Recommendations  No OT follow up    Equipment Recommendations  None recommended by OT    Recommendations for Other Services      Precautions / Restrictions Precautions Precautions: Fall Restrictions Other Position/Activity Restrictions: pt wil bil 1st metatarsal head calluses and pain with right so wear shoes       Mobility Bed Mobility               General bed mobility comments: Patient seated in recliner.  Transfers Overall transfer level: Needs assistance Equipment used: Rolling walker (2 wheeled) Transfers: Sit to/from Stand Sit to Stand: Min guard Stand pivot transfers: Min guard       General transfer comment: min/guard for safety, cues for hand placement    Balance   Sitting-balance support: No upper extremity supported;Feet supported Sitting balance-Leahy Scale: Good     Standing balance support: Bilateral upper extremity supported Standing balance-Leahy Scale: Poor Standing balance comment: needs RW to stabilize                           ADL either performed or assessed with clinical judgement   ADL       Grooming: Wash/dry hands;Standing;Min guard;Supervision/safety Grooming Details (indicate cue type and reason): [place] washed hands at sink with min guard. Verbal cues for where hand soap and paper towels are.              Lower Body Dressing: Set up Lower Body Dressing Details (indicate cue type and reason): Patient donned shoes seated in recliner. Demonstrated ability to tie laces - though some difficulty becase of o2 sat probe Toilet Transfer: Min guard;RW;Ambulation;Grab bars;Cueing for safety Toilet Transfer Details (indicate cue type and reason): Cueing to close toilet lid and use grab bar. Toileting- Water quality scientist and Hygiene: Min guard;Cueing for sequencing;Sit to/from stand Toileting - Clothing Manipulation Details (indicate cue type and reason): Able to manage clothing and wipe.     Functional mobility during ADLs: Passenger transport manager     Praxis      Cognition Arousal/Alertness: Awake/alert Behavior During Therapy: WFL for tasks assessed/performed Overall Cognitive Status: History of cognitive impairments - at baseline                                 General Comments: hx of dementia        Exercises     Shoulder Instructions       General Comments      Pertinent Vitals/ Pain       Pain Assessment: No/denies pain  Home Living  Prior Functioning/Environment              Frequency  Min 2X/week        Progress Toward Goals  OT Goals(current goals can now be found in the care plan section)  Progress towards OT goals: Progressing toward goals  Acute Rehab OT Goals Patient Stated Goal: I don't want to fall again OT Goal Formulation: With patient Time For Goal Achievement: 11/18/19 Potential to Achieve Goals: [place] Discharge plan remains appropriate    Co-evaluation                 AM-PAC OT "6 Clicks" Daily Activity     Outcome Measure   Help from another person eating meals?: None Help from another person taking care of personal grooming?: A Little Help from another person toileting, which includes using toliet, bedpan, or  urinal?: A Little Help from another person bathing (including washing, rinsing, drying)?: A Little Help from another person to put on and taking off regular upper body clothing?: A Little Help from another person to put on and taking off regular lower body clothing?: A Little 6 Click Score: 19    End of Session Equipment Utilized During Treatment: Gait belt;Rolling walker  OT Visit Diagnosis: Unsteadiness on feet (R26.81);Pain   Activity Tolerance Patient tolerated treatment well   Patient Left in chair;with call bell/phone within reach;with chair alarm set   Nurse Communication Mobility status        Time: 1916-6060 OT Time Calculation (min): 16 min  Charges: OT General Charges $OT Visit: 1 Visit OT Treatments $Self Care/Home Management : 8-22 mins  Derl Barrow, OTR/L [place]  Office [phone] Pager: [place] 11/06/2019, 2:19 PM

## 2019-11-07 DIAGNOSIS — Z515 Encounter for palliative care: Secondary | ICD-10-CM

## 2019-11-07 DIAGNOSIS — Z7189 Other specified counseling: Secondary | ICD-10-CM

## 2019-11-07 DIAGNOSIS — F339 Major depressive disorder, recurrent, unspecified: Secondary | ICD-10-CM

## 2019-11-07 LAB — CULTURE, BLOOD (ROUTINE X 2)
Culture: NO GROWTH
Special Requests: ADEQUATE

## 2019-11-07 MED ORDER — TRAMADOL HCL 50 MG PO TABS
50.0000 mg | ORAL_TABLET | Freq: Two times a day (BID) | ORAL | Status: DC | PRN
  Administered 2019-11-07 – 2019-11-11 (×7): 50 mg via ORAL
  Filled 2019-11-07 (×7): qty 1

## 2019-11-07 NOTE — Progress Notes (Addendum)
Transition of Care (TOC) -30 day Note       Patient Details  Name: Bianca [name]  IFX:[id number] Date of Birth: 1922/12/14  Attending Physician: Hosie Poisson MD     MUST ID:    To Whom it May Concern:   Please be advised that the above patient will require a short-term nursing home stay, anticipated 30 days or less rehabilitation and strengthening. The plan is for return home.

## 2019-11-07 NOTE — NC FL2 (Signed)
[place] LEVEL OF CARE SCREENING TOOL     IDENTIFICATION  Patient Name: Bianca [name] Birthdate: [birth year] Sex: female Admission Date (Current Location): 11/02/2019  [facility] and [place] Number:  Herbalist and Address:  [facility],  [place] [address], [place]      Provider Number: [id number]  Attending Physician Name and Address:  Hosie Poisson, MD  Relative Name and Phone Number:  Liliane Channel nephew [phone]    Current Level of Care: Hospital Recommended Level of Care: [place] Prior Approval Number:    Date Approved/Denied:   PASRR Number:    Discharge Plan: SNF    Current Diagnoses: Patient Active Problem List   Diagnosis Date Noted  . Adjustment disorder with mixed anxiety and depressed mood 11/07/2019  . Stroke ([place]) 11/05/2019  . Tachycardia 11/03/2019  . Hypotension 11/03/2019  . Altered mental status 11/02/2019  . Sinus bradycardia 11/02/2019  . Acute lower UTI 11/02/2019  . Memory deficit 10/26/2019  . CKD (chronic kidney disease) stage 3, GFR 30-59 ml/min 09/30/2019  . Edema 06/11/2019  . Metatarsalgia 06/11/2019  . GERD (gastroesophageal reflux disease) 05/14/2019  . Paroxysmal SVT (supraventricular tachycardia) ([place]) 04/27/2019  . Pneumonia due to COVID-19 virus 04/27/2019  . Chronic diarrhea 04/27/2019  . Urinary frequency 04/27/2019  . Protein-calorie malnutrition ([place]) 04/27/2019  . Emphysema of lung ([place]) 04/27/2019    Orientation RESPIRATION BLADDER Height & Weight     Self, Place  Normal Continent Weight: (!) 80 lb 4 oz (36.4 kg) Height:  5' (152.4 cm)  BEHAVIORAL SYMPTOMS/MOOD NEUROLOGICAL BOWEL NUTRITION STATUS      Continent Diet (Soft)  AMBULATORY STATUS COMMUNICATION OF NEEDS Skin   Extensive Assist Verbally Skin abrasions                       Personal Care Assistance Level of Assistance  Bathing, Feeding, Dressing Bathing Assistance: Maximum assistance Feeding  assistance: Limited assistance Dressing Assistance: Maximum assistance     Functional Limitations Info  Sight, Speech, Hearing Sight Info: Impaired Hearing Info: Adequate Speech Info: Adequate    SPECIAL CARE FACTORS FREQUENCY  PT (By licensed PT), OT (By licensed OT)     PT Frequency: 5x/week OT Frequency: 5x/week            Contractures Contractures Info: Not present    Additional Factors Info  Code Status, Allergies Code Status Info: DNR Allergies Info: Allergies: Azopt Brinzolamide, Brimonidine, Lumigan Bimatoprost           Current Medications (11/07/2019):  This is the current hospital active medication list Current Facility-Administered Medications  Medication Dose Route Frequency Provider Last Rate Last Admin  . acetaminophen (TYLENOL) tablet 650 mg  650 mg Oral Q6H PRN Harold Hedge, MD   650 mg at 11/07/19 1134   Or  . acetaminophen (TYLENOL) suppository 650 mg  650 mg Rectal Q6H PRN Harold Hedge, MD      . amiodarone (PACERONE) tablet 200 mg  200 mg Oral Daily Adrian Prows, MD   200 mg at 11/07/19 2409  . apixaban (ELIQUIS) tablet 2.5 mg  2.5 mg Oral BID Hosie Poisson, MD   2.5 mg at 11/07/19 0952  . atorvastatin (LIPITOR) tablet 20 mg  20 mg Oral q1800 Hosie Poisson, MD   20 mg at 11/06/19 1813  . bismuth subsalicylate (PEPTO BISMOL) 262 MG/15ML suspension 60 mL  60 mL Oral Daily PRN Harold Hedge, MD      .  diphenoxylate-atropine (LOMOTIL) 2.5-0.025 MG/5ML liquid 5 mL  5 mL Oral TID PRN Harold Hedge, MD   5 mL at 11/04/19 2125  . melatonin tablet 3 mg  3 mg Oral QHS Harold Hedge, MD   3 mg at 11/06/19 2136  . pantoprazole (PROTONIX) EC tablet 40 mg  40 mg Oral Daily Harold Hedge, MD   40 mg at 11/07/19 0953  . timolol (TIMOPTIC) 0.5 % ophthalmic solution 1 drop  1 drop Both Eyes Daily Harold Hedge, MD   1 drop at 11/07/19 0954  . traMADol (ULTRAM) tablet 50 mg  50 mg Oral Q12H PRN Hosie Poisson, MD   50 mg at 11/07/19 1306     Discharge  Medications: Please see discharge summary for a list of discharge medications.  Relevant Imaging Results:  Relevant Lab Results:   Additional Information SS[id number] [place] Lendy Dittrich, [place]

## 2019-11-07 NOTE — Consult Note (Signed)
[facility] Face-to-Face Psychiatry Consult   Reason for Consult: ''voiced that she wants to die, suicidal ideation. Hx of mild Dementia, concerned about depression.'' Referring Physician:  Hosie Poisson, MD Patient Identification: Bianca [name] MRN:  [id number] Principal Diagnosis: Altered mental status Diagnosis:  Principal Problem:   Altered mental status Active Problems:   Paroxysmal SVT (supraventricular tachycardia) (HCC)   CKD (chronic kidney disease) stage 3, GFR 30-59 ml/min   Sinus bradycardia   Acute lower UTI   Tachycardia   Hypotension   Stroke ([place])   Adjustment disorder with mixed anxiety and depressed mood   Total Time spent with patient: 1 hour  Subjective:   Bianca [name] is a 86 y.o. female patient admitted with weakness. Altered mental status.  HPI:   Patient with paroxysmal SVT, mild dementia, lives in assisted living place denies prior history of mental illness but was admitted with altered mental status. Patient is alert but only oriented to person and place. She denies suicidal thoughts; '' I have lived a good life and I will wait until the lord wants me.'' However, she reports excessive worries, anxiety, apprehensions and sadness about her medical conditions and some of her family members who are going through financial difficulties. Patient also verbalizes that she does not want to be homeless by the she is discharged from the hospital and would like a proper arrangement made for her to go back to her AFL facility or somewhere safe for her to live. She denies psychosis, delusions or mood swings.  Past Psychiatric History: none reported  Risk to Self:  denies Risk to Others:   denies Prior Inpatient Therapy:  N/A Prior Outpatient Therapy:  None in the past  Past Medical History:  Past Medical History:  Diagnosis Date  . COVID-19   . 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 No  pertinent surgical history. Family History: No family history on file. Family Psychiatric  History:  Social History:  Social History   Substance and Sexual Activity  Alcohol Use Never     Social History   Substance and Sexual Activity  Drug Use Never    Social History   Socioeconomic History  . Marital status: Widowed    Spouse name: Not on file  . Number of children: Not on file  . Years of education: Not on file  . Highest education level: Not on file  Occupational History  . Not on file  Tobacco Use  . Smoking status: Never Smoker  . Smokeless tobacco: Never Used  Vaping Use  . Vaping Use: Never used  Substance and Sexual Activity  . Alcohol use: Never  . Drug use: Never  . Sexual activity: Not on file  Other Topics Concern  . Not on file  Social History Narrative  . Not on file   Social Determinants of Health   Financial Resource Strain:   . Difficulty of Paying Living Expenses:   Food Insecurity:   . Worried About Charity fundraiser in the Last Year:   . Arboriculturist in the Last Year:   Transportation Needs:   . Film/video editor (Medical):   Marland Kitchen Lack of Transportation (Non-Medical):   Physical Activity:   . Days of Exercise per Week:   . Minutes of Exercise per Session:   Stress:   . Feeling of Stress :   Social Connections:   . Frequency of Communication with Friends and  Family:   . Frequency of Social Gatherings with Friends and Family:   . Attends Religious Services:   . Active Member of Clubs or Organizations:   . Attends Archivist Meetings:   Marland Kitchen Marital Status:    Additional Social History:    Allergies:   Allergies  Allergen Reactions  . Azopt [Brinzolamide] Other (See Comments)    UNK  . Brimonidine Other (See Comments)    UNK  . Lumigan [Bimatoprost] Other (See Comments)    UNK    Labs:  Results for orders placed or performed during the hospital encounter of 11/02/19 (from the past 48 hour(s))  Lipid panel      Status: None   Collection Time: 11/06/19  5:44 AM  Result Value Ref Range   Cholesterol 138 0 - 200 mg/dL   Triglycerides 86 <150 mg/dL   HDL 49 >40 mg/dL   Total CHOL/HDL Ratio 2.8 RATIO   VLDL 17 0 - 40 mg/dL   LDL Cholesterol 72 0 - 99 mg/dL    Comment:        Total Cholesterol/HDL:CHD Risk Coronary Heart Disease Risk Table                     Men   Women  1/2 Average Risk   3.4   3.3  Average Risk       5.0   4.4  2 X Average Risk   9.6   7.1  3 X Average Risk  23.4   11.0        Use the calculated Patient Ratio above and the CHD Risk Table to determine the patient's CHD Risk.        ATP III CLASSIFICATION (LDL):  <100     mg/dL   Optimal  100-129  mg/dL   Near or Above                    Optimal  130-159  mg/dL   Borderline  160-189  mg/dL   High  >190     mg/dL   Very High Performed at [facility] [address], [place], [place] [address]   CBC     Status: None   Collection Time: 11/06/19  5:44 AM  Result Value Ref Range   WBC 4.3 4.0 - 10.5 K/uL   RBC 4.22 3.87 - 5.11 MIL/uL   Hemoglobin 12.8 12.0 - 15.0 g/dL   HCT 41.1 36 - 46 %   MCV 97.4 80.0 - 100.0 fL   MCH 30.3 26.0 - 34.0 pg   MCHC 31.1 30.0 - 36.0 g/dL   RDW 15.1 11.5 - 15.5 %   Platelets 206 150 - 400 K/uL   nRBC 0.0 0.0 - 0.2 %    Comment: Performed at [facility], [place] [address] [place], [place] [address]  Comprehensive metabolic panel     Status: Abnormal   Collection Time: 11/06/19  5:44 AM  Result Value Ref Range   Sodium 138 135 - 145 mmol/L   Potassium 3.9 3.5 - 5.1 mmol/L   Chloride 107 98 - 111 mmol/L   CO2 23 22 - 32 mmol/L   Glucose, Bld 93 70 - 99 mg/dL    Comment: Glucose reference range applies only to samples taken after fasting for at least 8 hours.   BUN 9 8 - 23 mg/dL   Creatinine, Ser 0.72 0.44 - 1.00 mg/dL   Calcium 8.1 (L)  8.9 - 10.3 mg/dL   Total Protein 5.1 (L) 6.5 - 8.1 g/dL   Albumin 2.9 (L) 3.5 - 5.0 g/dL   AST 34 15 - 41  U/L   ALT 18 0 - 44 U/L   Alkaline Phosphatase 45 38 - 126 U/L   Total Bilirubin 0.3 0.3 - 1.2 mg/dL   GFR calc non Af Amer >60 >60 mL/min   GFR calc Af Amer >60 >60 mL/min   Anion gap 8 5 - 15    Comment: Performed at [facility], [place] [address] [place],  [address]    Current Facility-Administered Medications  Medication Dose Route Frequency Provider Last Rate Last Admin  . acetaminophen (TYLENOL) tablet 650 mg  650 mg Oral Q6H PRN Harold Hedge, MD   650 mg at 11/07/19 1134   Or  . acetaminophen (TYLENOL) suppository 650 mg  650 mg Rectal Q6H PRN Harold Hedge, MD      . amiodarone (PACERONE) tablet 200 mg  200 mg Oral Daily Adrian Prows, MD   200 mg at 11/07/19 1950  . apixaban (ELIQUIS) tablet 2.5 mg  2.5 mg Oral BID Hosie Poisson, MD   2.5 mg at 11/07/19 0952  . atorvastatin (LIPITOR) tablet 20 mg  20 mg Oral q1800 Hosie Poisson, MD   20 mg at 11/06/19 1813  . bismuth subsalicylate (PEPTO BISMOL) 262 MG/15ML suspension 60 mL  60 mL Oral Daily PRN Harold Hedge, MD      . diphenoxylate-atropine (LOMOTIL) 2.5-0.025 MG/5ML liquid 5 mL  5 mL Oral TID PRN Harold Hedge, MD   5 mL at 11/04/19 2125  . melatonin tablet 3 mg  3 mg Oral QHS Harold Hedge, MD   3 mg at 11/06/19 2136  . pantoprazole (PROTONIX) EC tablet 40 mg  40 mg Oral Daily Harold Hedge, MD   40 mg at 11/07/19 0953  . timolol (TIMOPTIC) 0.5 % ophthalmic solution 1 drop  1 drop Both Eyes Daily Harold Hedge, MD   1 drop at 11/07/19 9326    Musculoskeletal: Strength & Muscle Tone: not tested Gait & Station: not tested Patient leans: N/A  Psychiatric Specialty Exam: Physical Exam Psychiatric:        Attention and Perception: Attention normal.        Mood and Affect: Mood is anxious.        Speech: Speech normal.        Behavior: Behavior normal. Behavior is cooperative.        Thought Content: Thought content normal.        Cognition and Memory: Memory is impaired.        Judgment:  Judgment normal.     Review of Systems  Constitutional: Negative.   Psychiatric/Behavioral: The patient is nervous/anxious.     Blood pressure (!) 116/53, pulse 50, temperature 97.9 F (36.6 C), resp. rate 17, height 5' (1.524 m), weight (!) 36.4 kg, SpO2 98 %.Body mass index is 15.67 kg/m.  General Appearance: Casual  Eye Contact:  Good  Speech:  Clear and Coherent  Volume:  Normal  Mood:  Anxious  Affect:  Congruent  Thought Process:  Coherent  Orientation:  Other:  only to place and person  Thought Content:  Logical  Suicidal Thoughts:  No  Homicidal Thoughts:  No  Memory:  Immediate;   Good Recent;   Poor Remote;   Fair  Judgement:  Fair  Insight:  Fair  Psychomotor Activity:  Normal  Concentration:  Concentration: Fair and Attention Span: Fair  Recall:  [employer] of Knowledge:  Fair  Language:  Good  Akathisia:  No  Handed:  Right  AIMS (if indicated):     Assets:  Communication Skills Desire for Improvement Social Support  ADL's: marginal  Cognition:  Impaired,  Mild  Sleep:        Treatment Plan Summary: 86 year old female who denies prior history of mental illness, she was admitted with altered mental status. Apparently, patient reports being anxiety, worries, sadness about medical condition, family financial problem, her disposition upon discharge but denies self harming thoughts, delusions or psychosis.  Diagnosis: Adjustment disorder with mixed anxiety and depression  Recommendations: -Consider Low dose Zoloft 12.5 mg daily after the resolution of paroxysmal SVT. -Consider social worker consult to facilitate patient transfer to AFL or SNF upon discharge    Disposition: No evidence of imminent risk to self or others at present.   Patient does not meet criteria for psychiatric inpatient admission. Supportive therapy provided about ongoing stressors. Psychiatric service signing out. Re-consult as neede  Corena Pilgrim, MD 11/07/2019 11:39 AM

## 2019-11-07 NOTE — Progress Notes (Signed)
PROGRESS NOTE    Bianca [name]  WUJ:[id number] DOB: 1922/10/02 DOA: 11/02/2019 PCP: [name], [name], NP    Chief Complaint  Patient presents with  . Weakness    Brief Narrative:   86 year old lady with prior history of SVT on metoprolol, GERD presents from ALF with altered mental status.  On arrival to ED she was found to be bradycardic.  EKG shows sinus bradycardia.  Hospital course was complicated by transient tachycardia in the setting of holding home beta-blockers for paroxysmal SVT. Currently she remains bradycardic and asymptomatic from the bradycardia.  She also underwent CT head without contrast initially, which was negative for stroke. She did not have any focal decifits.  PT evaluation recommending Home health PT. SLP evaluation recommending dysphagia 3 diet with thin liquid. In view of her acute confusion , MRI of the brain was done, showed multiple acute strokes in the left hemisphere.  Neurology consulted, requested transfer the patient to [facility]. Discussed with the patient's nephew who agreed for the transfer. Stroke work up ordered and is pending. Overnight pt had an episode of atrial tachycardia requiring dig and diltiazem. Currently she is bradycardic and asymptomatic.    Assessment & Plan:   Principal Problem:   Altered mental status Active Problems:   Paroxysmal SVT (supraventricular tachycardia) (HCC)   CKD (chronic kidney disease) stage 3, GFR 30-59 ml/min   Sinus bradycardia   Acute lower UTI   Tachycardia   Hypotension   Stroke (HCC)   Left hemisphere stroke: -  MRI brain showed Several scattered small acute infarcts in the posterior left hemisphere, The Left PCA and Left MCA/PCA watershed area. No associated hemorrhage or mass effect.  Underlying chronic white matter signal changes most likely due to small vessel disease. Possibly an embolic stroke.  Cardiology consulted for evaluation of atrial fibrillation. No focal deficits.  Stroke work up ordered. MRA head  ordered and appears to wnl Carotid duplex Right Carotid: Velocities in the right ICA are consistent with a 1-39% stenosis. Left Carotid: Velocities in the left ICA are consistent with a 1-39%  stenosis.  Echocardiogram showed Left ventricular ejection fraction, by estimation, is 60 to 65%. The left ventricle has normal function. The left ventricle has no regional wall motion abnormalities. There is mild left ventricular hypertrophy. Left ventricular diastolic parameters are consistent with Grade II diastolic dysfunction (pseudonormalization). Elevated left atrial pressure. A1c is 5.9% lipid panel shows LDL of 72, started her on Lipitor 20 mg daily.   Discussed with cardiology and started her on eliquis 2.5 mg BID.  PT evaluation recommending home health PT with 24 hours supervision, repeat PT evaluation to see if she will qualify for SNF SLP eval recommending dysphagia 3 diet  Neurology consulted and recommendations given.   Acute encephalopathy probably secondary to urinary tract infection and medications and stroke.  TSH within normal limits Vitamin B12 and ammonia are within normal limits Patient was started on IV Rocephin for urinary tract infection. Urine cultures grew 60,000 E. Coli sensitive to keflex. Transitioned to keflex to complete the course.   Hypotension: Probably secondary to decreased oral intake versus dehydration from infection .  Resolved.   Tachybradycardia syndrome/atrial tachycardia/ atrial flutter Asymptomatic. Cardiology consulted, started her on amiodarone 200 mg , plan to transition to 100 mg daily on Monday.  Started her on low dose eliquis 2.5 mg BID.    GERD Stable.   Mild AKI Probably secondary to dehydration Patient came in with a creatinine of 1.1 improved to  0.7 with IV fluids.    DVT prophylaxis: Eliquis Code Status: DNR  family Communication: None at bedside discussed with nephew over the phone.  Disposition:   Status is:  Inpatient  Remains inpatient appropriate because:IV treatments appropriate due to intensity of illness or inability to take PO   Dispo: The patient is from: ALF              Anticipated d/c is to: SNF              Anticipated d/c date is: 1 day              Patient currently is not medically stable to d/c.       Consultants:   Neurology.   Cardiology Dr. Einar Gip  Procedures: None Antimicrobials:  Anti-infectives (From admission, onward)   Start     Dose/Rate Route Frequency Ordered Stop   11/05/19 1400  cefTRIAXone (ROCEPHIN) 1 g in sodium chloride 0.9 % 100 mL IVPB  Status:  Discontinued        1 g 200 mL/hr over 30 Minutes Intravenous Every 24 hours 11/04/19 1611 11/05/19 0915   11/05/19 1015  cephALEXin (KEFLEX) capsule 250 mg        250 mg Oral Every 12 hours 11/05/19 0916 11/06/19 2136   11/03/19 1500  cefTRIAXone (ROCEPHIN) 1 g in sodium chloride 0.9 % 100 mL IVPB        1 g 200 mL/hr over 30 Minutes Intravenous Every 24 hours 11/02/19 1739 11/04/19 2225   11/02/19 1445  cefTRIAXone (ROCEPHIN) 1 g in sodium chloride 0.9 % 100 mL IVPB        1 g 200 mL/hr over 30 Minutes Intravenous  Once 11/02/19 1437 11/02/19 1548       Subjective: Alert , following commands, no new complaints.   Objective: Vitals:   11/06/19 1216 11/06/19 1936 11/06/19 2148 11/07/19 0628  BP: (!) 116/63 (!) 119/54 (!) 120/48 (!) 116/53  Pulse: 45 50 50 50  Resp: 16 16 14 17   Temp: 97.9 F (36.6 C) 98.3 F (36.8 C) (!) 97.5 F (36.4 C) 97.9 F (36.6 C)  TempSrc: Oral  Oral   SpO2: 97% 97% 98% 98%  Weight:      Height:        Intake/Output Summary (Last 24 hours) at 11/07/2019 1045 Last data filed at 11/07/2019 1017 Gross per 24 hour  Intake 360 ml  Output --  Net 360 ml   Filed Weights   11/02/19 1014 11/02/19 2355  Weight: (!) 39.9 kg (!) 36.4 kg    Examination:  General exam: alert and comfortable, not in distress.  Respiratory system: Clear to auscultation bilaterally,  no wheezing or rhonchi Cardiovascular system: S1-S2 heard, no JVD regular rate rhythm, no pedal edema Gastrointestinal system: Abdomen is soft, nontender, nondistended, bowel sounds are normal Central nervous system: Alert and oriented to person only, grossly nonfocal Extremities: No pedal edema Skin: No rashes seen Psychiatry: Mood is appropriate  Data Reviewed: I have personally reviewed following labs and imaging studies  CBC: Recent Labs  Lab 11/02/19 1045 11/03/19 0454 11/06/19 0544  WBC 8.6 6.8 4.3  HGB 15.6* 14.9 12.8  HCT 48.9* 47.4* 41.1  MCV 94.6 96.0 97.4  PLT 250 230 829    Basic Metabolic Panel: Recent Labs  Lab 11/02/19 1045 11/03/19 0454 11/04/19 0535 11/06/19 0544  NA 139 143 141 138  K 4.5 4.1 3.6 3.9  CL 96* 103 107 107  CO2  28 22 24 23   GLUCOSE 99 76 92 93  BUN 33* 32* 25* 9  CREATININE 1.14* 1.02* 0.86 0.72  CALCIUM 9.4 8.5* 8.2* 8.1*  MG  --  2.3  --   --     GFR: Estimated Creatinine Clearance: 24.2 mL/min (by C-G formula based on SCr of 0.72 mg/dL).  Liver Function Tests: Recent Labs  Lab 11/02/19 1045 11/03/19 0454 11/06/19 0544  AST 34 32 34  ALT 19 18 18   ALKPHOS 64 57 45  BILITOT 0.8 0.9 0.3  PROT 6.9 6.1* 5.1*  ALBUMIN 4.0 3.5 2.9*    CBG: No results for input(s): GLUCAP in the last 168 hours.   Recent Results (from the past 240 hour(s))  Urine culture     Status: Abnormal   Collection Time: 11/02/19 10:47 AM   Specimen: Urine, Random  Result Value Ref Range Status   Specimen Description   Final    URINE, RANDOM Performed at [facility], [address], [place], [place] [address]    Special Requests   Final    NONE Performed at [facility], [place] [address] [place], [place] [address]    Culture 60,000 COLONIES/mL ESCHERICHIA COLI (A)  Final   Report Status 11/05/2019 FINAL  Final   Organism ID, Bacteria ESCHERICHIA COLI (A)  Final      Susceptibility   Escherichia coli - MIC*     AMPICILLIN >=32 RESISTANT Resistant     CEFAZOLIN 16 SENSITIVE Sensitive     CEFTRIAXONE <=0.25 SENSITIVE Sensitive     CIPROFLOXACIN <=0.25 SENSITIVE Sensitive     GENTAMICIN <=1 SENSITIVE Sensitive     IMIPENEM <=0.25 SENSITIVE Sensitive     NITROFURANTOIN 32 SENSITIVE Sensitive     TRIMETH/SULFA >=320 RESISTANT Resistant     AMPICILLIN/SULBACTAM >=32 RESISTANT Resistant     PIP/TAZO <=4 SENSITIVE Sensitive     * 60,000 COLONIES/mL ESCHERICHIA COLI  Blood culture (routine x 2)     Status: None   Collection Time: 11/02/19  2:38 PM   Specimen: BLOOD  Result Value Ref Range Status   Specimen Description   Final    BLOOD RIGHT ANTECUBITAL Performed at [facility], [place] [address] [place], [place] [address]    Special Requests   Final    BOTTLES DRAWN AEROBIC ONLY Blood Culture adequate volume Performed at [place] [address] [place], [place] [address]    Culture   Final    NO GROWTH 5 DAYS Performed at [facility], [place] [address], [place], [place] [address]    Report Status 11/07/2019 FINAL  Final  SARS Coronavirus 2 by RT PCR (hospital order, performed in [facility] hospital lab) Nasopharyngeal Nasopharyngeal Swab     Status: None   Collection Time: 11/02/19  2:40 PM   Specimen: Nasopharyngeal Swab  Result Value Ref Range Status   SARS Coronavirus 2 NEGATIVE NEGATIVE Final    Comment: (NOTE) SARS-CoV-2 target nucleic acids are NOT DETECTED.  The SARS-CoV-2 RNA is generally detectable in upper and lower respiratory specimens during the acute phase of infection. The lowest concentration of SARS-CoV-2 viral copies this assay can detect is 250 copies / mL. A negative result does not preclude SARS-CoV-2 infection and should not be used as the sole basis for treatment or other patient management decisions.  A negative result may occur with improper specimen collection / handling, submission of specimen other than nasopharyngeal swab,  presence of  viral mutation(s) within the areas targeted by this assay, and inadequate number of viral copies (<250 copies / mL). A negative result must be combined with clinical observations, patient history, and epidemiological information.  Fact Sheet for Patients:   StrictlyIdeas.no  Fact Sheet for Healthcare Providers: BankingDealers.co.za  This test is not yet approved or  cleared by the [place] FDA and has been authorized for detection and/or diagnosis of SARS-CoV-2 by FDA under an Emergency Use Authorization (EUA).  This EUA will remain in effect (meaning this test can be used) for the duration of the COVID-19 declaration under Section 564(b)(1) of the Act, 21 U.S.C. section 360bbb-3(b)(1), unless the authorization is terminated or revoked sooner.  Performed at [facility], [place] [address], [place], [place] [address]   MRSA PCR Screening     Status: None   Collection Time: 11/04/19  2:02 PM   Specimen: Nasal Mucosa; Nasopharyngeal  Result Value Ref Range Status   MRSA by PCR NEGATIVE NEGATIVE Final    Comment:        The GeneXpert MRSA Assay (FDA approved for NASAL specimens only), is one component of a comprehensive MRSA colonization surveillance program. It is not intended to diagnose MRSA infection nor to guide or monitor treatment for MRSA infections. Performed at [facility], [place] [address] [place], [place] [address]          Radiology Studies: MR ANGIO HEAD WO CONTRAST  Result Date: 11/05/2019 CLINICAL DATA:  Initial evaluation for acute stroke. EXAM: MRA HEAD WITHOUT CONTRAST TECHNIQUE: Angiographic images of the Circle of Willis were obtained using MRA technique without intravenous contrast. COMPARISON:  Prior brain MRI from 11/04/2019. FINDINGS: ANTERIOR CIRCULATION: Visualized distal cervical segments of the internal carotid arteries are widely patent with  symmetric antegrade flow. Petrous, cavernous, and supraclinoid ICAs widely patent without stenosis or other abnormality. Origin of the ophthalmic arteries patent. ICA termini well perfused. A1 segments patent bilaterally. Normal anterior communicating artery complex. Anterior cerebral arteries widely patent to their distal aspects without stenosis. No M1 stenosis or occlusion. Normal MCA bifurcations. Distal MCA branches well perfused and symmetric. POSTERIOR CIRCULATION: Vertebral arteries widely patent to the vertebrobasilar junction without stenosis. Left vertebral artery dominant. Partially visualized posterior inferior cerebral arteries patent bilaterally. Basilar widely patent to its distal aspect without stenosis. Superior cerebral arteries patent bilaterally. Both PCAs primarily supplied via the basilar well perfused to their distal aspects. No intracranial aneurysm or other vascular abnormality. IMPRESSION: Normal intracranial MRA. No large vessel occlusion, hemodynamically significant stenosis, or other vascular abnormality. No aneurysm. Electronically Signed   By: Jeannine Boga M.D.   On: 11/05/2019 19:49   ECHOCARDIOGRAM COMPLETE  Result Date: 11/05/2019    ECHOCARDIOGRAM REPORT   Patient Name:   KEYONTA MADRID Date of Exam: 11/05/2019 Medical Rec #:  [id number]        Height:       60.0 in Accession #:    [id number]       Weight:       80.2 lb Date of Birth:  1922-10-06         BSA:          1.266 m Patient Age:    89 years         BP:           117/54 mmHg Patient Gender: F                HR:  52 bpm. Exam Location:  Inpatient Procedure: 2D Echo, Cardiac Doppler and Color Doppler Indications:    COVID-19  History:        Patient has no prior history of Echocardiogram examinations.  Sonographer:    Jonelle Sidle Dance Referring Phys: Lewie Chamber Treonna Klee IMPRESSIONS  1. Left ventricular ejection fraction, by estimation, is 60 to 65%. The left ventricle has normal function. The left ventricle  has no regional wall motion abnormalities. There is mild left ventricular hypertrophy. Left ventricular diastolic parameters are consistent with Grade II diastolic dysfunction (pseudonormalization). Elevated left atrial pressure.  2. Right ventricular systolic function is normal. The right ventricular size is normal. There is mildly elevated pulmonary artery systolic pressure. The estimated right ventricular systolic pressure is 30.1 mmHg.  3. Right atrial size was mildly dilated.  4. The mitral valve is normal in structure. Trivial mitral valve regurgitation.  5. Tricuspid valve regurgitation is moderate to severe.  6. The aortic valve is tricuspid. Aortic valve regurgitation is mild. Mild aortic valve sclerosis is present, with no evidence of aortic valve stenosis.  7. The inferior vena cava is normal in size with greater than 50% respiratory variability, suggesting right atrial pressure of 3 mmHg. FINDINGS  Left Ventricle: Left ventricular ejection fraction, by estimation, is 60 to 65%. The left ventricle has normal function. The left ventricle has no regional wall motion abnormalities. The left ventricular internal cavity size was small. There is mild left ventricular hypertrophy. Left ventricular diastolic parameters are consistent with Grade II diastolic dysfunction (pseudonormalization). Elevated left atrial pressure. Right Ventricle: The right ventricular size is normal. Right vetricular wall thickness was not assessed. Right ventricular systolic function is normal. There is mildly elevated pulmonary artery systolic pressure. The tricuspid regurgitant velocity is 2.97 m/s, and with an assumed right atrial pressure of 3 mmHg, the estimated right ventricular systolic pressure is 60.1 mmHg. Left Atrium: Left atrial size was normal in size. Right Atrium: Right atrial size was mildly dilated. Pericardium: Trivial pericardial effusion is present. Mitral Valve: The mitral valve is normal in structure. Trivial  mitral valve regurgitation. Tricuspid Valve: The tricuspid valve is normal in structure. Tricuspid valve regurgitation is moderate to severe. Aortic Valve: The aortic valve is tricuspid. Aortic valve regurgitation is mild. Aortic regurgitation PHT measures 558 msec. Mild aortic valve sclerosis is present, with no evidence of aortic valve stenosis. Pulmonic Valve: The pulmonic valve was grossly normal. Pulmonic valve regurgitation is trivial. Aorta: The aortic root and ascending aorta are structurally normal, with no evidence of dilitation. Venous: The inferior vena cava is normal in size with greater than 50% respiratory variability, suggesting right atrial pressure of 3 mmHg. IAS/Shunts: The interatrial septum was not well visualized.  LEFT VENTRICLE PLAX 2D LVIDd:         3.21 cm LVIDs:         2.01 cm LV PW:         1.15 cm LV IVS:        0.90 cm LVOT diam:     1.70 cm LV SV:         47 LV SV Index:   37 LVOT Area:     2.27 cm  RIGHT VENTRICLE          IVC RV Basal diam:  2.88 cm  IVC diam: 1.49 cm RV Mid diam:    1.94 cm TAPSE (M-mode): 1.8 cm LEFT ATRIUM             Index  RIGHT ATRIUM           Index LA diam:        3.20 cm 2.53 cm/m  RA Area:     13.90 cm LA Vol (A2C):   44.4 ml 35.07 ml/m RA Volume:   34.40 ml  27.17 ml/m LA Vol (A4C):   35.0 ml 27.64 ml/m LA Biplane Vol: 40.9 ml 32.30 ml/m  AORTIC VALVE LVOT Vmax:   94.80 cm/s LVOT Vmean:  63.000 cm/s LVOT VTI:    0.205 m AI PHT:      558 msec  AORTA Ao Root diam: 2.80 cm Ao Asc diam:  2.70 cm MITRAL VALVE                TRICUSPID VALVE MV Area (PHT): 3.21 cm     TR Peak grad:   35.3 mmHg MV Decel Time: 236 msec     TR Vmax:        297.00 cm/s MV E velocity: 102.00 cm/s MV A velocity: 68.30 cm/s   SHUNTS MV E/A ratio:  1.49         Systemic VTI:  0.20 m                             Systemic Diam: 1.70 cm Oswaldo Milian MD Electronically signed by Oswaldo Milian MD Signature Date/Time: 11/05/2019/5:59:08 PM    Final    VAS US  CAROTID  Result Date: 11/06/2019 Carotid Arterial Duplex Study Indications:       CVA. Comparison Study:  no prior Performing Technologist: Abram Sander RVS  Examination Guidelines: A complete evaluation includes B-mode imaging, spectral Doppler, color Doppler, and power Doppler as needed of all accessible portions of each vessel. Bilateral testing is considered an integral part of a complete examination. Limited examinations for reoccurring indications may be performed as noted.  Right Carotid Findings: +----------+--------+--------+--------+------------------+--------+           PSV cm/sEDV cm/sStenosisPlaque DescriptionComments +----------+--------+--------+--------+------------------+--------+ CCA Prox  59      12              heterogenous               +----------+--------+--------+--------+------------------+--------+ CCA Distal52      7               heterogenous               +----------+--------+--------+--------+------------------+--------+ ICA Prox  34      10      1-39%   heterogenous               +----------+--------+--------+--------+------------------+--------+ ICA Distal47      11                                         +----------+--------+--------+--------+------------------+--------+ ECA       55                                                 +----------+--------+--------+--------+------------------+--------+ +----------+--------+-------+--------+-------------------+           PSV cm/sEDV cmsDescribeArm Pressure (mmHG) +----------+--------+-------+--------+-------------------+ [id number]                                         +----------+--------+-------+--------+-------------------+ +---------+--------+--+--------+--+---------+  VertebralPSV cm/s52EDV cm/s14Antegrade +---------+--------+--+--------+--+---------+  Left Carotid Findings: +----------+--------+--------+--------+------------------+--------+           PSV cm/sEDV  cm/sStenosisPlaque DescriptionComments +----------+--------+--------+--------+------------------+--------+ CCA Prox  57      14              heterogenous               +----------+--------+--------+--------+------------------+--------+ CCA Distal63      12              heterogenous               +----------+--------+--------+--------+------------------+--------+ ICA Prox  59      13      1-39%   heterogenous      tortuous +----------+--------+--------+--------+------------------+--------+ ICA Distal86      14                                         +----------+--------+--------+--------+------------------+--------+ ECA       77                                                 +----------+--------+--------+--------+------------------+--------+ +----------+--------+--------+--------+-------------------+           PSV cm/sEDV cm/sDescribeArm Pressure (mmHG) +----------+--------+--------+--------+-------------------+ [id number]                                          +----------+--------+--------+--------+-------------------+ +---------+--------+--+--------+-+---------+ VertebralPSV cm/s86EDV cm/s9Antegrade +---------+--------+--+--------+-+---------+   Summary: Right Carotid: Velocities in the right ICA are consistent with a 1-39% stenosis. Left Carotid: Velocities in the left ICA are consistent with a 1-39% stenosis. Vertebrals: Bilateral vertebral arteries demonstrate antegrade flow. *See table(s) above for measurements and observations.     Preliminary         Scheduled Meds: . amiodarone  200 mg Oral Daily  . apixaban  2.5 mg Oral BID  . atorvastatin  20 mg Oral q1800  . melatonin  3 mg Oral QHS  . pantoprazole  40 mg Oral Daily  . timolol  1 drop Both Eyes Daily   Continuous Infusions:   LOS: 5 days       Hosie Poisson, MD [facility]   To contact the attending provider between 7A-7P or the covering provider during after  hours 7P-7A, please log into the web site [email or web address] and access using universal [place] password for that web site. If you do not have the password, please call the hospital operator.  11/07/2019, 10:45 AM

## 2019-11-07 NOTE — Progress Notes (Signed)
Physical Therapy Treatment Patient Details Name: Bianca [name] MRN: [id number] DOB: 1922/09/03 Today's Date: 11/07/2019    History of Present Illness 86 yo female admitted with AMS possibly due to UTI, hypotension, bradycardia, R foot pain. Hx of dementia, SVT, chronic pain, COVID, CKD, chronic diarrhea.  MRI: Several scattered small acute infarcts in the posterior left hemisphere, The Left PCA and Left MCA/PCA watershed area    PT Comments    The patient is pleasant, able to follow directions for mobility. Patient's gait is unsteady this visit and requires support for balance while using RW. The patient is not safe to ambulate at anytime  without assistance. [facility]. Continue PT for increased mobility..     Follow Up Recommendations  SNF;Supervision/Assistance - 24 hour     Equipment Recommendations  Rolling walker with 5" wheels    Recommendations for Other Services       Precautions / Restrictions Precautions Precautions: Fall Precaution Comments: pt very fearful of falling Restrictions Other Position/Activity Restrictions: pt wil bil 1st metatarsal head calluses and pain with right so wear shoes    Mobility  Bed Mobility   Bed Mobility: Supine to Sit;Sit to Supine     Supine to sit: Supervision Sit to supine: Supervision   General bed mobility comments: patient mobilizes with cues  Transfers Overall transfer level: Needs assistance Equipment used: Rolling walker (2 wheeled) Transfers: Sit to/from Stand Sit to Stand: Min assist         General transfer comment: steady assistance, shakey and slightly ataxic, feet move in place attempting to balance  Ambulation/Gait Ambulation/Gait assistance: Min assist Gait Distance (Feet): 60 Feet Assistive device: Rolling walker (2 wheeled) Gait Pattern/deviations: Step-to pattern;Step-through pattern;Festinating Gait velocity: decr   General Gait Details: at times steps laterally when  balance lost, steady assist for safety and balance.   Stairs             Wheelchair Mobility    Modified Rankin (Stroke Patients Only)       Balance Overall balance assessment: Needs assistance Sitting-balance support: No upper extremity supported;Feet supported Sitting balance-Leahy Scale: Good     Standing balance support: Bilateral upper extremity supported Standing balance-Leahy Scale: Poor Standing balance comment: needs RW to stabilize                            Cognition Arousal/Alertness: Awake/alert Behavior During Therapy: WFL for tasks assessed/performed Overall Cognitive Status: History of cognitive impairments - at baseline                                 General Comments: hx of dementia      Exercises      General Comments        Pertinent Vitals/Pain Faces Pain Scale: Hurts a little bit Pain Location: R 1st metatarsal head (callus) with pressure Pain Descriptors / Indicators: Discomfort;Sore;Tender Pain Intervention(s): Monitored during session    Home Living                      Prior Function            PT Goals (current goals can now be found in the care plan section) Progress towards PT goals: Progressing toward goals    Frequency    Min 2X/week      PT Plan Discharge plan needs  to be updated;Frequency needs to be updated    Co-evaluation              AM-PAC PT "6 Clicks" Mobility   Outcome Measure  Help needed turning from your back to your side while in a flat bed without using bedrails?: None Help needed moving from lying on your back to sitting on the side of a flat bed without using bedrails?: None Help needed moving to and from a bed to a chair (including a wheelchair)?: A Lot Help needed standing up from a chair using your arms (e.g., wheelchair or bedside chair)?: A Lot Help needed to walk in hospital room?: A Lot Help needed climbing 3-5 steps with a railing? : A Lot 6  Click Score: 16    End of Session   Activity Tolerance: Patient tolerated treatment well Patient left: in bed;with call bell/phone within reach;with nursing/sitter in room Nurse Communication: Mobility status PT Visit Diagnosis: Muscle weakness (generalized) (M62.81);Unsteadiness on feet (R26.81);Difficulty in walking, not elsewhere classified (R26.2);Other symptoms and signs involving the nervous system (O32.919)     Time: 1660-6004 PT Time Calculation (min) (ACUTE ONLY): 16 min  Charges:  $Gait Training: 8-22 mins                     [place] Pager [phone] Office [phone]    Claretha Cooper 11/07/2019, 8:48 AM

## 2019-11-07 NOTE — Plan of Care (Signed)
Pt VS WNL this am. Complaining of pain in right foot, unrelieved by tylenol.  New PRN med administered per [facility].    Problem: Education: Goal: Knowledge of General Education information will improve Description: Including pain rating scale, medication(s)/side effects and non-pharmacologic comfort measures Outcome: Progressing   Problem: Clinical Measurements: Goal: Ability to maintain clinical measurements within normal limits will improve Outcome: Progressing Goal: Will remain free from infection Outcome: Progressing Goal: Diagnostic test results will improve Outcome: Progressing Goal: Respiratory complications will improve Outcome: Progressing Goal: Cardiovascular complication will be avoided Outcome: Progressing   Problem: Nutrition: Goal: Adequate nutrition will be maintained Outcome: Progressing   Problem: Pain Managment: Goal: General experience of comfort will improve Outcome: Progressing   Problem: Safety: Goal: Ability to remain free from injury will improve Outcome: Progressing   Problem: Skin Integrity: Goal: Risk for impaired skin integrity will decrease Outcome: Progressing

## 2019-11-07 NOTE — TOC Progression Note (Signed)
Transition of Care [facility]) - Progression Note    Patient Details  Name: Bianca [name] MRN: [id number] Date of Birth: 1922/07/11  Transition of Care [facility]) CM/SW [place], [place] Phone Number: 11/07/2019, 1:22 PM  Clinical Narrative:    CSW met with the patient at bedside. Patient concern about going back to her ALF, "I need help, I know I need help. I cannot walk by myself, I have trouble eating my food." CSW listened to patient concerns and offered support. CSW reached out to the patient nephew Liliane Channel and explain PT recommendations and the patient concerns. He is agrees the patient will need rehab before going back to her ALF.  CSW confirm with RN  Andee Poles the [place] will have a bed for the patient.  Patient was seen by psychiatrist. Change in condition form submitted to [facility].  Patient will need PASRR approval before d/c to SNF.  FL2 completed.    Expected Discharge Plan: [place] Barriers to Discharge: Continued Medical Work up, [place] Forensic scientist)  Expected Discharge Plan and Services Expected Discharge Plan: [place]   Discharge Planning Services: CM Consult Post Acute Care Choice:  (ALF) Living arrangements for the past 2 months: Assisted Living Facility                                       Social Determinants of Health (SDOH) Interventions    Readmission Risk Interventions No flowsheet data found.

## 2019-11-08 ENCOUNTER — Inpatient Hospital Stay (HOSPITAL_COMMUNITY): Payer: Medicare Other

## 2019-11-08 MED ORDER — SERTRALINE HCL 25 MG PO TABS
12.5000 mg | ORAL_TABLET | Freq: Every day | ORAL | Status: DC
  Administered 2019-11-08 – 2019-11-11 (×4): 12.5 mg via ORAL
  Filled 2019-11-08 (×4): qty 1

## 2019-11-08 NOTE — Consult Note (Signed)
Palliative care consult note  Reason for consult: Goals of care in light of advanced age and multiple comorbid conditions including left hemispheric stroke  Height of care consult received.  Chart reviewed including personal review of pertinent labs and imaging.  Discussed case with bedside care team.  I saw and examined [name] this afternoon.  I introduced palliative care as specialized medical care for people living with serious illness. It focuses on providing relief from the symptoms and stress of a serious illness. The goal is to improve quality of life for both the patient and the family.  Briefly, [name] is a 86 year old female with past medical history of SVT on metoprolol, GERD who presented from assisted living facility with altered mental status.  She is found to be bradycardic on arrival to ED but has remained asymptomatic.  She underwent head CT followed by MRI which showed acute strokes in the left hemisphere.  Neurology and cardiology evaluated her with recommendation for starting Eliquis.  She has also been evaluated by psychiatry due to statements that were interpreted as questionable for suicidal ideation.  Psychiatry has evaluated her and determined this is largely related to anxiety and sadness about her medical condition and financial problems with her family.  She was determined not to present evidence of imminent risk to herself or others and recommendation for starting SSRI following resolution of SVT.  Palliative consulted for goals of care.  I met today with [name].  She is a very sweet 86 year old female who is sitting in bedside chair in no acute distress.  She was working on [employer] and was very proud of how much she was able to eat.  She tells me that she has, "live the best life I possibly could."  She states that she married the right man, traveled as much as she wanted to, continues to have the faith, and is ready to go to heaven when it is her  time.  She also tells me that she wishes that God would take her as she is tired.  She expressed that she finds the most joy from spending time with family, however, she does not get to see her nephews nor sister as much as she would like.  She does endorse this is been worse since Covid started as it has limited the amount of time that they come to visit her.  She reports being anxious about next steps and fears that she will be homeless when she leaves the hospital.  I talked with her about plan to transition back to skilled facility at [facility] and that our social worker was working in conjunction with them to arrange this.  She seemed to be relieved by this, but also continued to state that she wants to ensure that she has a safe place to go when she leaves.  She does not feel she can function independently at this point in time.  She tells me that she is "not sure" if she continues to benefit from coming to the hospital and I talked with her about having an outpatient palliative care provider come and see her in a couple of weeks to see how she is doing at facility.  Overall, [name] appears to be someone who feels as though she completed all the goal she had in life and is accepting of death when the time comes.  She has also been feeling more lonely since the Covid pandemic started as it is limited interactions with  other people, including her family.  She may be well served by consideration for election of hospice benefits at some point in the near future, however, I am not sure that she currently has a specific diagnosis that would make her hospice eligible.  My recommendation, therefore, is to have palliative care follow-up with her at skilled facility to continue to see how she progresses over the next several weeks.  If she regain strength and thrives, likely plan will be to continue with outpatient palliative care services.  If, however, she continues to decline her nutrition, cognition,  and functional status, she may be better served to consider more of a comfort based approach to her care moving forward.  -DNR/DNI -Discussed with LCSW.  Agree with plan to transition back to [facility] at higher level care at skilled facility.  -Recommend palliative care follow-up at skilled facility.  [name] tells me that she is ready to die, however, I am not sure that she would be hospice eligible at this point in time.  I think this will largely depend upon her continued clinical course of the next several weeks at rehab.  Would therefore recommend palliative care follow-up to continue conversation with her and her family regarding potential hospice involvement in her care if she does appear to be appropriate to consider hospice services. -No other palliative specific recommendations at this point.  Please call if there are other areas with which we can be of assistance in the care of [name].  Total time: 50 minutes Greater than 50%  of this time was spent counseling and coordinating care related to the above assessment and plan.  Micheline Rough, MD [place] Team [phone]

## 2019-11-08 NOTE — Progress Notes (Signed)
PROGRESS NOTE    ALOHA [name]  KYH:[id number] DOB: February 15, 1923 DOA: 11/02/2019 PCP: [name], [name], NP    Chief Complaint  Patient presents with  . Weakness    Brief Narrative:   86 year old lady with prior history of SVT on metoprolol, GERD presents from ALF with altered mental status.  On arrival to ED she was found to be bradycardic.  EKG shows sinus bradycardia.  Hospital course was complicated by transient tachycardia in the setting of holding home beta-blockers for paroxysmal SVT. Currently she remains bradycardic and asymptomatic from the bradycardia.  She also underwent CT head without contrast initially, which was negative for stroke. She did not have any focal decifits.  PT evaluation recommending Home health PT. SLP evaluation recommending dysphagia 3 diet with thin liquid. In view of her acute confusion , MRI of the brain was done, showed multiple acute strokes in the left hemisphere.  Neurology consulted, requested transfer the patient to [facility]. She couldn't be transferred due to bed unavailability. Neurology consulted and underwent stroke work up at [facility].  Her hospital course was complicated by tachybradycardia syndrome and atrial tachycardia at which point cardiology was consulted.  She was started on low-dose amiodarone 200 mg daily.  Her rates been controlled so far.  Overnight patient complained of right foot pain.  X-rays of the foot ordered for further evaluation. Physical therapy evaluation recommending SNF.  TOC on board and plan for discharge to [facility] on Monday  Assessment & Plan:   Principal Problem:   Altered mental status Active Problems:   Paroxysmal SVT (supraventricular tachycardia) (HCC)   CKD (chronic kidney disease) stage 3, GFR 30-59 ml/min   Sinus bradycardia   Acute lower UTI   Tachycardia   Hypotension   Stroke (HCC)   Adjustment disorder with mixed anxiety and depressed mood   Left hemisphere stroke: -  MRI brain showed Several scattered  small acute infarcts in the posterior left hemisphere, The Left PCA and Left MCA/PCA watershed area. No associated hemorrhage or mass effect.  Underlying chronic white matter signal changes most likely due to small vessel disease. Possibly an embolic stroke.  Cardiology consulted for evaluation of atrial fibrillation. No focal deficits.  Stroke work up ordered. MRA head ordered and appears to wnl Carotid duplex Right Carotid: Velocities in the right ICA are consistent with a 1-39% stenosis. Left Carotid: Velocities in the left ICA are consistent with a 1-39%  stenosis.  Echocardiogram showed Left ventricular ejection fraction, by estimation, is 60 to 65%. The left ventricle has normal function. The left ventricle has no regional wall motion abnormalities. There is mild left ventricular hypertrophy. Left ventricular diastolic parameters are consistent with Grade II diastolic dysfunction (pseudonormalization). Elevated left atrial pressure. A1c is 5.9% lipid panel shows LDL of 72, started her on Lipitor 20 mg daily.   Discussed with cardiology and started her on eliquis 2.5 mg BID.  PT evaluation recommending snf with 24 hours supervision.  SLP eval recommending dysphagia 3 diet  Neurology consulted and recommendations given.   Acute encephalopathy probably secondary to urinary tract infection and medications and stroke.  TSH within normal limits Vitamin B12 and ammonia are within normal limits Patient was started on IV Rocephin for urinary tract infection. Urine cultures grew 60,000 E. Coli sensitive to keflex. Transitioned to keflex to complete the course.   Hypotension: Probably secondary to decreased oral intake versus dehydration from infection .  Resolved.    Tachybradycardia syndrome/atrial tachycardia/ atrial flutter Asymptomatic. Cardiology  consulted, started her on amiodarone 200 mg , plan to transition to 100 mg daily on Monday.  Started her on low dose eliquis 2.5 mg BID.     GERD Stable.    Mild AKI Probably secondary to dehydration Patient came in with a creatinine of 1.1 improved to 0.7 with IV fluids.  Nutrition:  On soft diet.  Dietary consulted.   Pt reported that she is ready to die. She denies any suicidal or homicidal ideations.  Palliative care on board, and recommended outpatient palliative follow up .  Psychiatry consulted, added zologt for mood improvement.    Right foot pain.  Pain control with tramadol and x rays of the foot ordered.   DVT prophylaxis: Eliquis Code Status: DNR  family Communication: None at bedside discussed with nephew over the phone.  Disposition:   Status is: Inpatient  Remains inpatient appropriate because:Unsafe d/c plan   Dispo: The patient is from: ALF              Anticipated d/c is to: SNF              Anticipated d/c date is: 1 day              Patient currently is not medically stable to d/c.       Consultants:   Neurology.   Cardiology Dr. Einar Gip  Procedures: None Antimicrobials:  Anti-infectives (From admission, onward)   Start     Dose/Rate Route Frequency Ordered Stop   11/05/19 1400  cefTRIAXone (ROCEPHIN) 1 g in sodium chloride 0.9 % 100 mL IVPB  Status:  Discontinued        1 g 200 mL/hr over 30 Minutes Intravenous Every 24 hours 11/04/19 1611 11/05/19 0915   11/05/19 1015  cephALEXin (KEFLEX) capsule 250 mg        250 mg Oral Every 12 hours 11/05/19 0916 11/06/19 2136   11/03/19 1500  cefTRIAXone (ROCEPHIN) 1 g in sodium chloride 0.9 % 100 mL IVPB        1 g 200 mL/hr over 30 Minutes Intravenous Every 24 hours 11/02/19 1739 11/04/19 2225   11/02/19 1445  cefTRIAXone (ROCEPHIN) 1 g in sodium chloride 0.9 % 100 mL IVPB        1 g 200 mL/hr over 30 Minutes Intravenous  Once 11/02/19 1437 11/02/19 1548       Subjective: Right foot pain, unable to bear weight on the foot today.  Objective: Vitals:   11/07/19 2036 11/08/19 0550 11/08/19 1000 11/08/19 1252  BP: (!)  107/64 (!) 122/60 (!) 109/52 (!) 136/66  Pulse: 47 46 52 53  Resp: 16 16 16 20   Temp: 97.8 F (36.6 C) (!) 97.5 F (36.4 C)  97.6 F (36.4 C)  TempSrc: Oral Oral  Oral  SpO2: 99% 98%  99%  Weight:      Height:        Intake/Output Summary (Last 24 hours) at 11/08/2019 1310 Last data filed at 11/08/2019 1300 Gross per 24 hour  Intake 800 ml  Output --  Net 800 ml   Filed Weights   11/02/19 1014 11/02/19 2355  Weight: (!) 39.9 kg (!) 36.4 kg    Examination:  General exam: alert and comfortable, not in distress.  Respiratory system: Clear to auscultation bilaterally, no wheezing or rhonchi Cardiovascular system: S1-S2 heard, no JVD, regular rate and rhythm, no pedal Gastrointestinal system: Abdomen is soft, nontender, nondistended, bowel sounds normal  Central nervous system: Alert and oriented to person and place only, grossly nonfocal Extremities: Some tenderness in the right foot on the plantar aspect Skin: No rashes seen Psychiatry: Mood is appropriate  Data Reviewed: I have personally reviewed following labs and imaging studies  CBC: Recent Labs  Lab 11/02/19 1045 11/03/19 0454 11/06/19 0544  WBC 8.6 6.8 4.3  HGB 15.6* 14.9 12.8  HCT 48.9* 47.4* 41.1  MCV 94.6 96.0 97.4  PLT 250 230 976    Basic Metabolic Panel: Recent Labs  Lab 11/02/19 1045 11/03/19 0454 11/04/19 0535 11/06/19 0544  NA 139 143 141 138  K 4.5 4.1 3.6 3.9  CL 96* 103 107 107  CO2 28 22 24 23   GLUCOSE 99 76 92 93  BUN 33* 32* 25* 9  CREATININE 1.14* 1.02* 0.86 0.72  CALCIUM 9.4 8.5* 8.2* 8.1*  MG  --  2.3  --   --     GFR: Estimated Creatinine Clearance: 24.2 mL/min (by C-G formula based on SCr of 0.72 mg/dL).  Liver Function Tests: Recent Labs  Lab 11/02/19 1045 11/03/19 0454 11/06/19 0544  AST 34 32 34  ALT 19 18 18   ALKPHOS  64 57 45  BILITOT 0.8 0.9 0.3  PROT 6.9 6.1* 5.1*  ALBUMIN 4.0 3.5 2.9*    CBG: No results for input(s): GLUCAP in the last 168 hours.   Recent Results (from the past 240 hour(s))  Urine culture     Status: Abnormal   Collection Time: 11/02/19 10:47 AM   Specimen: Urine, Random  Result Value Ref Range Status   Specimen Description   Final    URINE, RANDOM Performed at [facility], [address] [place], [place] [address]    Special Requests   Final    NONE Performed at [facility], [place] [address], [place], [place] [address]    Culture 60,000 COLONIES/mL ESCHERICHIA COLI (A)  Final   Report Status 11/05/2019 FINAL  Final   Organism ID, Bacteria ESCHERICHIA COLI (A)  Final      Susceptibility   Escherichia coli - MIC*    AMPICILLIN >=32 RESISTANT Resistant     CEFAZOLIN 16 SENSITIVE Sensitive     CEFTRIAXONE <=0.25 SENSITIVE Sensitive     CIPROFLOXACIN <=0.25 SENSITIVE Sensitive     GENTAMICIN <=1 SENSITIVE Sensitive     IMIPENEM <=0.25 SENSITIVE Sensitive     NITROFURANTOIN 32 SENSITIVE Sensitive     TRIMETH/SULFA >=320 RESISTANT Resistant     AMPICILLIN/SULBACTAM >=32 RESISTANT Resistant     PIP/TAZO <=4 SENSITIVE Sensitive     * 60,000 COLONIES/mL ESCHERICHIA COLI  Blood culture (routine x 2)     Status: None   Collection Time: 11/02/19  2:38 PM   Specimen: BLOOD  Result Value Ref Range Status   Specimen Description   Final    BLOOD RIGHT ANTECUBITAL Performed at [facility], [place] [address], [place], [place] [address]    Special Requests   Final    BOTTLES DRAWN AEROBIC ONLY Blood Culture adequate volume Performed at [facility] [address] [place], [place] [address]    Culture   Final    NO GROWTH 5 DAYS Performed at [facility], [place] [address] [place], [place] [address]    Report Status 11/07/2019 FINAL  Final  SARS Coronavirus 2 by RT PCR (hospital order, performed in [facility] hospital  lab) Nasopharyngeal Nasopharyngeal Swab     Status: None  Collection Time: 11/02/19  2:40 PM   Specimen: Nasopharyngeal Swab  Result Value Ref Range Status   SARS Coronavirus 2 NEGATIVE NEGATIVE Final    Comment: (NOTE) SARS-CoV-2 target nucleic acids are NOT DETECTED.  The SARS-CoV-2 RNA is generally detectable in upper and lower respiratory specimens during the acute phase of infection. The lowest concentration of SARS-CoV-2 viral copies this assay can detect is 250 copies / mL. A negative result does not preclude SARS-CoV-2 infection and should not be used as the sole basis for treatment or other patient management decisions.  A negative result may occur with improper specimen collection / handling, submission of specimen other than nasopharyngeal swab, presence of viral mutation(s) within the areas targeted by this assay, and inadequate number of viral copies (<250 copies / mL). A negative result must be combined with clinical observations, patient history, and epidemiological information.  Fact Sheet for Patients:   StrictlyIdeas.no  Fact Sheet for Healthcare Providers: BankingDealers.co.za  This test is not yet approved or  cleared by the [place] FDA and has been authorized for detection and/or diagnosis of SARS-CoV-2 by FDA under an Emergency Use Authorization (EUA).  This EUA will remain in effect (meaning this test can be used) for the duration of the COVID-19 declaration under Section 564(b)(1) of the Act, 21 U.S.C. section 360bbb-3(b)(1), unless the authorization is terminated or revoked sooner.  Performed at [facility], [place] [address] [place], [place] [address]   MRSA PCR Screening     Status: None   Collection Time: 11/04/19  2:02 PM   Specimen: Nasal Mucosa; Nasopharyngeal  Result Value Ref Range Status   MRSA by PCR NEGATIVE NEGATIVE Final    Comment:        The GeneXpert MRSA Assay  (FDA approved for NASAL specimens only), is one component of a comprehensive MRSA colonization surveillance program. It is not intended to diagnose MRSA infection nor to guide or monitor treatment for MRSA infections. Performed at [facility], [place] [address], [place], [place] [address]          Radiology Studies: VAS US CAROTID  Result Date: 11/06/2019 Carotid Arterial Duplex Study Indications:       CVA. Comparison Study:  no prior Performing Technologist: Abram Sander RVS  Examination Guidelines: A complete evaluation includes B-mode imaging, spectral Doppler, color Doppler, and power Doppler as needed of all accessible portions of each vessel. Bilateral testing is considered an integral part of a complete examination. Limited examinations for reoccurring indications may be performed as noted.  Right Carotid Findings: +----------+--------+--------+--------+------------------+--------+           PSV cm/sEDV cm/sStenosisPlaque DescriptionComments +----------+--------+--------+--------+------------------+--------+ CCA Prox  59      12              heterogenous               +----------+--------+--------+--------+------------------+--------+ CCA Distal52      7               heterogenous               +----------+--------+--------+--------+------------------+--------+ ICA Prox  34      10      1-39%   heterogenous               +----------+--------+--------+--------+------------------+--------+ ICA Distal47      11                                         +----------+--------+--------+--------+------------------+--------+  ECA       55                                                 +----------+--------+--------+--------+------------------+--------+ +----------+--------+-------+--------+-------------------+           PSV cm/sEDV cmsDescribeArm Pressure (mmHG) +----------+--------+-------+--------+-------------------+ [id number]                                          +----------+--------+-------+--------+-------------------+ +---------+--------+--+--------+--+---------+ VertebralPSV cm/s52EDV cm/s14Antegrade +---------+--------+--+--------+--+---------+  Left Carotid Findings: +----------+--------+--------+--------+------------------+--------+           PSV cm/sEDV cm/sStenosisPlaque DescriptionComments +----------+--------+--------+--------+------------------+--------+ CCA Prox  57      14              heterogenous               +----------+--------+--------+--------+------------------+--------+ CCA Distal63      12              heterogenous               +----------+--------+--------+--------+------------------+--------+ ICA Prox  59      13      1-39%   heterogenous      tortuous +----------+--------+--------+--------+------------------+--------+ ICA Distal86      14                                         +----------+--------+--------+--------+------------------+--------+ ECA       77                                                 +----------+--------+--------+--------+------------------+--------+ +----------+--------+--------+--------+-------------------+           PSV cm/sEDV cm/sDescribeArm Pressure (mmHG) +----------+--------+--------+--------+-------------------+ [id number]                                          +----------+--------+--------+--------+-------------------+ +---------+--------+--+--------+-+---------+ VertebralPSV cm/s86EDV cm/s9Antegrade +---------+--------+--+--------+-+---------+   Summary: Right Carotid: Velocities in the right ICA are consistent with a 1-39% stenosis. Left Carotid: Velocities in the left ICA are consistent with a 1-39% stenosis. Vertebrals: Bilateral vertebral arteries demonstrate antegrade flow. *See table(s) above for measurements and observations.     Preliminary         Scheduled Meds: . amiodarone  200 mg  Oral Daily  . apixaban  2.5 mg Oral BID  . atorvastatin  20 mg Oral q1800  . melatonin  3 mg Oral QHS  . pantoprazole  40 mg Oral Daily  . sertraline  12.5 mg Oral QHS  . timolol  1 drop Both Eyes Daily   Continuous Infusions:   LOS: 6 days       Hosie Poisson, MD [facility]   To contact the attending provider between 7A-7P or the covering provider during after hours 7P-7A, please log into the web site [email or web address] and access using universal [place] password for that web site. If you do not have the password, please call the hospital operator.  11/08/2019, 1:10  PM    

## 2019-11-09 LAB — SARS CORONAVIRUS 2 BY RT PCR (HOSPITAL ORDER, PERFORMED IN ~~LOC~~ HOSPITAL LAB): SARS Coronavirus 2: NEGATIVE

## 2019-11-09 MED ORDER — AMIODARONE HCL 200 MG PO TABS
100.0000 mg | ORAL_TABLET | Freq: Every day | ORAL | Status: DC
  Administered 2019-11-10 – 2019-11-12 (×3): 100 mg via ORAL
  Filled 2019-11-09 (×3): qty 1

## 2019-11-09 NOTE — TOC Progression Note (Signed)
Transition of Care [facility]) - Progression Note    Patient Details  Name: Bianca [name] MRN: [id number] Date of Birth: [birth year]  Transition of [facility] CM/SW Contact  Joaquin Courts, RN Phone Number: 11/09/2019, 12:21 PM  Clinical Narrative:    PASSR went to level 2 manual review.  All requested supporting documents have been submitted.  CM will await for PASSR to finalize.  Patient will need an updated covid test for facility admission.   Expected Discharge Plan: Skilled Nursing Facility Barriers to Discharge: [facility])  Expected Discharge Plan and Services Expected Discharge Plan: [place]   Discharge Planning Services: CM Consult Post Acute Care Choice:  (ALF) Living arrangements for the past 2 months: Assisted Living Facility                                       Social Determinants of Health (SDOH) Interventions    Readmission Risk Interventions No flowsheet data found.

## 2019-11-09 NOTE — Consult Note (Signed)
Pt alert and aware sitting in chair. She states that she is doing ok but she can't move around to much. 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 The chaplain offered caring and supportive presence and listening ear. She said that she had not been able to attend church in a while. I sang three hymns to her that were her favorite. She was grateful for the visit and requested further visits.

## 2019-11-09 NOTE — Progress Notes (Signed)
PROGRESS NOTE    Bianca [name]  FYB:[id number] DOB: July 24, 1922 DOA: 11/02/2019 PCP: [name], [name], NP    Chief Complaint  Patient presents with  . Weakness    Brief Narrative:   86 year old lady with prior history of SVT on metoprolol, GERD presents from ALF with altered mental status.  On arrival to ED she was found to be bradycardic.  EKG shows sinus bradycardia.  Hospital course was complicated by transient tachycardia in the setting of holding home beta-blockers for paroxysmal SVT. Currently she remains bradycardic and asymptomatic from the bradycardia.  She also underwent CT head without contrast initially, which was negative for stroke. She did not have any focal decifits.  PT evaluation recommending Home health PT. SLP evaluation recommending dysphagia 3 diet with thin liquid. In view of her acute confusion , MRI of the brain was done, showed multiple acute strokes in the left hemisphere.  Neurology consulted, requested transfer the patient to [facility]. She couldn't be transferred due to bed unavailability. Neurology consulted and underwent stroke work up at [facility].  Her hospital course was complicated by tachybradycardia syndrome and atrial tachycardia at which point cardiology was consulted.  She was started on low-dose amiodarone 200 mg daily.  Her rates been controlled so far. Patient complained of right foot pain.  X-rays of the foot ordered for further evaluation.  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 Physical therapy evaluation recommending SNF.  TOC on board and plan for discharge to [facility] on Monday Patient seen and examined at bedside new complaints at this time wanted to know when she can be discharged.  Assessment & Plan:   Principal Problem:   Altered mental status Active Problems:   Paroxysmal SVT (supraventricular tachycardia) (HCC)   CKD (chronic kidney disease) stage 3, GFR 30-59 ml/min   Sinus bradycardia   Acute lower UTI    Tachycardia   Hypotension   Stroke (HCC)   Adjustment disorder with mixed anxiety and depressed mood   Left hemisphere stroke: -  MRI brain showed Several scattered small acute infarcts in the posterior left hemisphere, The Left PCA and Left MCA/PCA watershed area. No associated hemorrhage or mass effect.  Underlying chronic white matter signal changes most likely due to small vessel disease. Possibly an embolic stroke.  Cardiology consulted for evaluation of atrial fibrillation. No focal deficits.  Stroke work up ordered. MRA head ordered and appears to wnl Carotid duplex Right Carotid: Velocities in the right ICA are consistent with a 1-39% stenosis. Left Carotid: Velocities in the left ICA are consistent with a 1-39%  stenosis.  Echocardiogram showed Left ventricular ejection fraction, by estimation, is 60 to 65%. The left ventricle has normal function. The left ventricle has no regional wall motion abnormalities. There is mild left ventricular hypertrophy. Left ventricular diastolic parameters are consistent with Grade II diastolic dysfunction (pseudonormalization). Elevated left atrial pressure. A1c is 5.9% lipid panel shows LDL of 72, started her on Lipitor 20 mg daily.   Discussed with cardiology and started her on eliquis 2.5 mg BID.  PT evaluation recommending snf with 24 hours supervision.  SLP eval recommending dysphagia 3 diet  Neurology consulted and recommendations given. No new complaints at this time.   Acute encephalopathy probably secondary to urinary tract infection and medications and stroke.  TSH within normal limits Vitamin B12 and ammonia are within normal limits Patient was started on IV Rocephin for urinary tract infection. Urine cultures grew 60,000 E. Coli sensitive to keflex.  Transitioned to keflex to complete the course.  Completed the course of Keflex.  Hypotension: Probably secondary to decreased oral intake versus dehydration from infection .   Resolved.   Tachybradycardia syndrome/atrial tachycardia/ atrial flutter Asymptomatic. Cardiology consulted, started her on amiodarone 200 mg , consideration 200 mg of amiodarone daily today. Started her on low dose eliquis 2.5 mg BID.    GERD Stable.    Mild AKI Probably secondary to dehydration Patient came in with a creatinine of 1.1 improved to 0.7 with IV fluids.  No new labs today.  Nutrition:  On soft diet.  Dietary consulted.   Pt reported that she is ready to die. She denies any suicidal or homicidal ideations.  Palliative care on board, and recommended outpatient palliative follow up .  Psychiatry consulted, added zoloft for mood improvement.    Right foot pain.  Pain control with tramadol and x rays of the foot ordered.  X-rays of the foot showed mild osteoarthritis of the first metatarsophalangeal joint .  DVT prophylaxis: Eliquis Code Status: DNR  family Communication: None at bedside discussed with nephew over the phone.  Disposition:   Status is: Inpatient  Remains inpatient appropriate because:Unsafe d/c plan   Dispo: The patient is from: ALF              Anticipated d/c is to: SNF              Anticipated d/c date is: 1 day              Patient currently is medically stable to d/c.       Consultants:   Neurology.   Cardiology Dr. Einar Gip  Procedures: None Antimicrobials:  Anti-infectives (From admission, onward)   Start     Dose/Rate Route Frequency Ordered Stop   11/05/19 1400  cefTRIAXone (ROCEPHIN) 1 g in sodium chloride 0.9 % 100 mL IVPB  Status:  Discontinued        1 g 200 mL/hr over 30 Minutes Intravenous Every 24 hours 11/04/19 1611 11/05/19 0915   11/05/19 1015  cephALEXin (KEFLEX) capsule 250 mg        250 mg Oral Every 12 hours 11/05/19 0916 11/06/19 2136   11/03/19 1500  cefTRIAXone (ROCEPHIN) 1 g in sodium chloride 0.9 % 100 mL IVPB        1 g 200 mL/hr over 30 Minutes Intravenous Every 24 hours 11/02/19 1739 11/04/19  2225   11/02/19 1445  cefTRIAXone (ROCEPHIN) 1 g in sodium chloride 0.9 % 100 mL IVPB        1 g 200 mL/hr over 30 Minutes Intravenous  Once 11/02/19 1437 11/02/19 1548       Subjective: No new complaints today.   Objective: Vitals:   11/08/19 2027 11/09/19 0612 11/09/19 1108 11/09/19 1239  BP: (!) 114/59 (!) 118/56 (!) 112/61 107/60  Pulse: 52 49 52 (!) 52  Resp: 16 18 16 17   Temp: 99 F (37.2 C) 98.8 F (37.1 C) 97.8 F (36.6 C) 98.2 F (36.8 C)  TempSrc: Oral Oral Oral Oral  SpO2: 96% 96% 99% 97%  Weight:      Height:        Intake/Output Summary (Last 24 hours) at 11/09/2019 1559 Last data filed at 11/09/2019 1300 Gross per 24 hour  Intake 240 ml  Output --  Net 240 ml   Filed Weights   11/02/19 1014 11/02/19 2355  Weight: (!) 39.9 kg (!) 36.4 kg    Examination:  General  exam: Alert and comfortable no distress noted Respiratory system: Clear to auscultation bilaterally, no wheezing or rhonchi Cardiovascular system: S1-S2 heard, no JVD, regular rate rhythm, no pedal edema. Gastrointestinal system: Abdomen is soft, nontender, nondistended, bowel sounds normal                              Central nervous system: Alert and oriented to person only, grossly nonfocal Extremities: Persistent tenderness in the right foot on the plantar aspect with some erythema. Skin: No ulcers or rashes seen Psychiatry: Mood is appropriate Data Reviewed: I have personally reviewed following labs and imaging studies  CBC: Recent Labs  Lab 11/03/19 0454 11/06/19 0544  WBC 6.8 4.3  HGB 14.9 12.8  HCT 47.4* 41.1  MCV 96.0 97.4  PLT 230 127    Basic Metabolic Panel: Recent Labs  Lab 11/03/19 0454 11/04/19 0535 11/06/19 0544  NA 143 141 138  K 4.1 3.6 3.9  CL 103 107 107  CO2 22 24 23   GLUCOSE 76 92 93  BUN 32* 25* 9  CREATININE 1.02* 0.86 0.72  CALCIUM 8.5* 8.2* 8.1*  MG 2.3  --   --     GFR: Estimated Creatinine Clearance: 24.2 mL/min (by C-G formula based on SCr  of 0.72 mg/dL).  Liver Function Tests: Recent Labs  Lab 11/03/19 0454 11/06/19 0544  AST 32 34  ALT 18 18  ALKPHOS 57 45  BILITOT 0.9 0.3  PROT 6.1* 5.1*  ALBUMIN 3.5 2.9*    CBG: No results for input(s): GLUCAP in the last 168 hours.   Recent Results (from the past 240 hour(s))  Urine culture     Status: Abnormal   Collection Time: 11/02/19 10:47 AM   Specimen: Urine, Random  Result Value Ref Range Status   Specimen Description   Final    URINE, RANDOM Performed at [facility], [address], [place], [place] [address]    Special Requests   Final    NONE Performed at [facility], [place] [address] [place], [place] [address]    Culture 60,000 COLONIES/mL ESCHERICHIA COLI (A)  Final   Report Status 11/05/2019 FINAL  Final   Organism ID, Bacteria ESCHERICHIA COLI (A)  Final      Susceptibility   Escherichia coli - MIC*    AMPICILLIN >=32 RESISTANT Resistant     CEFAZOLIN 16 SENSITIVE Sensitive     CEFTRIAXONE <=0.25 SENSITIVE Sensitive     CIPROFLOXACIN <=0.25 SENSITIVE Sensitive     GENTAMICIN <=1 SENSITIVE Sensitive     IMIPENEM <=0.25 SENSITIVE Sensitive     NITROFURANTOIN 32 SENSITIVE Sensitive     TRIMETH/SULFA >=320 RESISTANT Resistant     AMPICILLIN/SULBACTAM >=32 RESISTANT Resistant     PIP/TAZO <=4 SENSITIVE Sensitive     * 60,000 COLONIES/mL ESCHERICHIA COLI  Blood culture (routine x 2)     Status: None   Collection Time: 11/02/19  2:38 PM   Specimen: BLOOD  Result Value Ref Range Status   Specimen Description   Final    BLOOD RIGHT ANTECUBITAL Performed at [facility], [place] [address] [place], [place] [address]    Special Requests   Final    BOTTLES DRAWN AEROBIC ONLY Blood Culture adequate volume Performed at [place] [address] [place], [place] [address]    Culture   Final    NO GROWTH 5 DAYS Performed at [facility], [address], [place], [place] [address]    Report  Status 11/07/2019 FINAL  Final  SARS Coronavirus 2 by RT PCR (hospital order, performed in [facility] hospital lab) Nasopharyngeal Nasopharyngeal Swab     Status: None   Collection Time: 11/02/19  2:40 PM   Specimen: Nasopharyngeal Swab  Result Value Ref Range Status   SARS Coronavirus 2 NEGATIVE NEGATIVE Final    Comment: (NOTE) SARS-CoV-2 target nucleic acids are NOT DETECTED.  The SARS-CoV-2 RNA is generally detectable in upper and lower respiratory specimens during the acute phase of infection. The lowest concentration of SARS-CoV-2 viral copies this assay can detect is 250 copies / mL. A negative result does not preclude SARS-CoV-2 infection and should not be used as the sole basis for treatment or other patient management decisions.  A negative result may occur with improper specimen collection / handling, submission of specimen other than nasopharyngeal swab, presence of viral mutation(s) within the areas targeted by this assay, and inadequate number of viral copies (<250 copies / mL). A negative result must be combined with clinical observations, patient history, and epidemiological information.  Fact Sheet for Patients:   StrictlyIdeas.no  Fact Sheet for Healthcare Providers: BankingDealers.co.za  This test is not yet approved or  cleared by the [place] FDA and has been authorized for detection and/or diagnosis of SARS-CoV-2 by FDA under an Emergency Use Authorization (EUA).  This EUA will remain in effect (meaning this test can be used) for the duration of the COVID-19 declaration under Section 564(b)(1) of the Act, 21 U.S.C. section 360bbb-3(b)(1), unless the authorization is terminated or revoked sooner.  Performed at [facility], [place] [address] [place], [place] [address]   MRSA PCR Screening     Status: None   Collection Time: 11/04/19  2:02 PM   Specimen: Nasal Mucosa; Nasopharyngeal   Result Value Ref Range Status   MRSA by PCR NEGATIVE NEGATIVE Final    Comment:        The GeneXpert MRSA Assay (FDA approved for NASAL specimens only), is one component of a comprehensive MRSA colonization surveillance program. It is not intended to diagnose MRSA infection nor to guide or monitor treatment for MRSA infections. Performed at [facility], [place] [address] [place], [place] [address]   SARS Coronavirus 2 by RT PCR (hospital order, performed in [facility] hospital lab) Nasopharyngeal Nasopharyngeal Swab     Status: None   Collection Time: 11/09/19 12:15 PM   Specimen: Nasopharyngeal Swab  Result Value Ref Range Status   SARS Coronavirus 2 NEGATIVE NEGATIVE Final    Comment: (NOTE) SARS-CoV-2 target nucleic acids are NOT DETECTED.  The SARS-CoV-2 RNA is generally detectable in upper and lower respiratory specimens during the acute phase of infection. The lowest concentration of SARS-CoV-2 viral copies this assay can detect is 250 copies / mL. A negative result does not preclude SARS-CoV-2 infection and should not be used as the sole basis for treatment or other patient management decisions.  A negative result may occur with improper specimen collection / handling, submission of specimen other than nasopharyngeal swab, presence of viral mutation(s) within the areas targeted by this assay, and inadequate number of viral copies (<250 copies / mL). A negative result must be combined with clinical observations, patient history, and epidemiological information.  Fact Sheet for Patients:   StrictlyIdeas.no  Fact Sheet for Healthcare Providers: BankingDealers.co.za  This test is not yet approved or  cleared by the [place]  FDA and has been authorized for detection and/or diagnosis of SARS-CoV-2 by FDA under an Emergency Use Authorization (EUA).  This EUA will remain in effect (meaning this test can  be used) for the duration of the COVID-19 declaration under Section 564(b)(1) of the Act, 21 U.S.C. section 360bbb-3(b)(1), unless the authorization is terminated or revoked sooner.  Performed at [facility], [place] [address] [place], [place] [address]          Radiology Studies: DG Foot 2 Views Right  Result Date: 11/08/2019 CLINICAL DATA:  Plantar foot pain cross the ball of the foot at the first through third metatarsal-phalangeal joint region. EXAM: RIGHT FOOT - 2 VIEW COMPARISON:  None. FINDINGS: Mild pes cavus and hammertoe deformity of the third and fourth phalanges. Trace osteoarthritis of the first metatarsal phalangeal joint. No erosion, periosteal reaction, or bony destruction. No plantar calcaneal spur. No focal soft tissue abnormality. IMPRESSION: 1. Mild osteoarthritis of the first metatarsophalangeal joint. 2. Pes cavus and hammertoe deformity of the third and fourth phalanges. Electronically Signed   By: Keith Rake M.D.   On: 11/08/2019 15:09        Scheduled Meds: . amiodarone  200 mg Oral Daily  . apixaban  2.5 mg Oral BID  . atorvastatin  20 mg Oral q1800  . melatonin  3 mg Oral QHS  . pantoprazole  40 mg Oral Daily  . sertraline  12.5 mg Oral QHS  . timolol  1 drop Both Eyes Daily   Continuous Infusions:   LOS: 7 days       Hosie Poisson, MD [facility]   To contact the attending provider between 7A-7P or the covering provider during after hours 7P-7A, please log into the web site [email or web address] and access using universal  password for that web site. If you do not have the password, please call the hospital operator.  11/09/2019, 3:59 PM

## 2019-11-09 NOTE — Progress Notes (Signed)
Physical Therapy Treatment Patient Details Name: Bianca [name] MRN: [id number] DOB: [birth year] Today's Date: 11/09/2019    History of Present Illness 86 yo female admitted with AMS possibly due to UTI, hypotension, bradycardia, R foot pain. Hx of dementia, SVT, chronic pain, COVID, CKD, chronic diarrhea.  MRI: Several scattered small acute infarcts in the posterior left hemisphere, The Left PCA and Left MCA/PCA watershed area    PT Comments    Pt pleasant and cooperative, agreeable to amb however very fearful of falling. Gait deviations improved with incr distance. Will continue to follow in acute setting   Follow Up Recommendations  SNF;Supervision/Assistance - 24 hour     Equipment Recommendations  Rolling walker with 5" wheels    Recommendations for Other Services       Precautions / Restrictions Precautions Precautions: Fall Precaution Comments: pt very fearful of falling Restrictions Weight Bearing Restrictions: No Other Position/Activity Restrictions: pt wil bil 1st metatarsal head calluses and pain with right so wear shoes    Mobility  Bed Mobility               General bed mobility comments: in chair on PT arrival   Transfers Overall transfer level: Needs assistance Equipment used: Rolling walker (2 wheeled) Transfers: Sit to/from Stand Sit to Stand: Min assist         General transfer comment: light assist to rise, steady and to control descent. cues for hand placement   Ambulation/Gait Ambulation/Gait assistance: Min assist Gait Distance (Feet): 80 Feet Assistive device: Rolling walker (2 wheeled) Gait Pattern/deviations: Step-to pattern;Step-through pattern;Decreased stride length;Narrow base of support Gait velocity: decr   General Gait Details: cues for incr step length, stride improves with distance. pt is very fearful of falling    Stairs             Wheelchair Mobility    Modified Rankin (Stroke Patients Only)        Balance   Sitting-balance support: No upper extremity supported;Feet supported Sitting balance-Leahy Scale: Good     Standing balance support: Bilateral upper extremity supported Standing balance-Leahy Scale: Poor Standing balance comment: reliant on UEs for balance                             Cognition Arousal/Alertness: Awake/alert Behavior During Therapy: WFL for tasks assessed/performed Overall Cognitive Status: History of cognitive impairments - at baseline                                 General Comments: hx of dementia      Exercises      General Comments        Pertinent Vitals/Pain Pain Assessment: Faces Faces Pain Scale: Hurts a little bit Pain Location: R 1st metatarsal head (callus) with pressure Pain Descriptors / Indicators: Discomfort;Sore;Tender Pain Intervention(s): Limited activity within patient's tolerance;Monitored during session    Home Living                      Prior Function            PT Goals (current goals can now be found in the care plan section) Acute Rehab PT Goals Patient Stated Goal: I don't want to fall again PT Goal Formulation: With patient Time For Goal Achievement: 11/18/19 Potential to Achieve Goals: Good Progress towards PT goals: Progressing toward goals    Frequency  Min 2X/week      PT Plan Discharge plan needs to be updated;Frequency needs to be updated    Co-evaluation              AM-PAC PT "6 Clicks" Mobility   Outcome Measure  Help needed turning from your back to your side while in a flat bed without using bedrails?: None Help needed moving from lying on your back to sitting on the side of a flat bed without using bedrails?: None Help needed moving to and from a bed to a chair (including a wheelchair)?: A Little Help needed standing up from a chair using your arms (e.g., wheelchair or bedside chair)?: A Little Help needed to walk in hospital room?: A  Little Help needed climbing 3-5 steps with a railing? : A Lot 6 Click Score: 19    End of Session Equipment Utilized During Treatment: Gait belt Activity Tolerance: Patient tolerated treatment well Patient left: in chair;with call bell/phone within reach;with chair alarm set   PT Visit Diagnosis: Muscle weakness (generalized) (M62.81);Unsteadiness on feet (R26.81);Difficulty in walking, not elsewhere classified (R26.2);Other symptoms and signs involving the nervous system (R29.898)     Time: 1030-1040 PT Time Calculation (min) (ACUTE ONLY): 10 min  Charges:  $Gait Training: 8-22 mins                     Baxter Flattery, PT  Acute Rehab Dept ([place]) [phone] Pager [phone]  11/09/2019    [facility] 11/09/2019, 10:55 AM

## 2019-11-09 NOTE — Care Management Important Message (Signed)
Important Message  Patient Details IM Letter given to the Patient Name: Bianca [name] MRN: [id number] Date of Birth: 15-Sep-1922   Medicare Important Message Given:  Yes     Kerin Salen 11/09/2019, 10:41 AM

## 2019-11-10 DIAGNOSIS — F4323 Adjustment disorder with mixed anxiety and depressed mood: Secondary | ICD-10-CM

## 2019-11-10 MED ORDER — APIXABAN 2.5 MG PO TABS: 2.5000 mg | ORAL_TABLET | Freq: Two times a day (BID) | ORAL | 1 refills | Status: DC

## 2019-11-10 MED ORDER — SERTRALINE HCL 25 MG PO TABS: 12.5000 mg | ORAL_TABLET | Freq: Every day | ORAL | 0 refills | Status: DC

## 2019-11-10 MED ORDER — AMIODARONE HCL 100 MG PO TABS: 100.0000 mg | ORAL_TABLET | Freq: Every day | ORAL | 1 refills | Status: DC

## 2019-11-10 MED ORDER — ATORVASTATIN CALCIUM 20 MG PO TABS: 20.0000 mg | ORAL_TABLET | Freq: Every day | ORAL | 1 refills | Status: DC

## 2019-11-10 NOTE — TOC Progression Note (Signed)
Transition of Care [facility]) - Progression Note    Patient Details  Name: Bianca [name] MRN: [id number] Date of Birth: [birth year]  Transition of [facility]) CM/SW Contact  Joaquin Courts, RN Phone Number: 11/10/2019, 3:08 PM  Clinical Narrative:    PASSR requests additional information.  All requested documents faxed.    Expected Discharge Plan: Skilled Nursing Facility Barriers to Discharge: [place] [facility])  Expected Discharge Plan and Services Expected Discharge Plan: [place]   Discharge Planning Services: CM Consult Post Acute Care Choice:  (ALF) Living arrangements for the past 2 months: Assisted Living Facility                                       Social Determinants of Health (SDOH) Interventions    Readmission Risk Interventions No flowsheet data found.

## 2019-11-10 NOTE — TOC Progression Note (Signed)
Transition of Care [facility]) - Progression Note    Patient Details  Name: Bianca [name] MRN: [id number] Date of Birth: [birth year]  Transition of Care [facility]) CM/SW Contact  Joaquin Courts, RN Phone Number: 11/10/2019, 11:33 AM  Clinical Narrative:    PASSR still pending.    Expected Discharge Plan: Skilled Nursing Facility Barriers to Discharge: [place] [facility])  Expected Discharge Plan and Services Expected Discharge Plan: [place]   Discharge Planning Services: CM Consult Post Acute Care Choice:  (ALF) Living arrangements for the past 2 months: Assisted Living Facility                                       Social Determinants of Health (SDOH) Interventions    Readmission Risk Interventions No flowsheet data found.

## 2019-11-10 NOTE — Discharge Summary (Signed)
Physician Discharge Summary  KATELIN KUTSCH OTL:[id number] DOB: Nov 12, 1922 DOA: 11/02/2019  PCP: [name], [name], NP  Admit date: 11/02/2019 Discharge date: 11/11/2019  Admitted From: alf Disposition:  [facility].   Recommendations for Outpatient Follow-up:  1. Follow up with PCP in 1-2 weeks 2. Please obtain BMP/CBC in one week Please follow up with neurology as recommended.  Please follow up with Dr Einar Gip in 2 to 4 weeks.   Discharge Condition:stable.  CODE STATUS:DNR Diet recommendation: Heart Healthy  Brief/Interim Summary:  86 year old lady with prior history of SVT on metoprolol, GERD presents from ALF with altered mental status.  On arrival to ED she was found to be bradycardic.  EKG shows sinus bradycardia.  Hospital course was complicated by transient tachycardia in the setting of holding home beta-blockers for paroxysmal SVT. Currently she remains bradycardic and asymptomatic from the bradycardia.  She also underwent CT head without contrast initially, which was negative for stroke. She did not have any focal decifits.  PT evaluation recommending Home health PT. SLP evaluation recommending dysphagia 3 diet with thin liquid. In view of her acute confusion , MRI of the brain was done, showed multiple acute strokes in the left hemisphere.  Neurology consulted, requested transfer the patient to [facility]. She couldn't be transferred due to bed unavailability. Neurology consulted and underwent stroke work up at [facility].  Her hospital course was complicated by tachybradycardia syndrome and atrial tachycardia at which point cardiology was consulted.  She was started on low-dose amiodarone 200 mg daily.  Her rates been controlled so far. Patient complained of right foot pain.  X-rays of the foot ordered for further evaluation.  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 Physical therapy evaluation recommending SNF.  TOC on board and [facility]  [facility] Patient seen and examined at bedside new complaints at this time wanted to know when she can be discharged.  Discharge Diagnoses:  Principal Problem:   Altered mental status Active Problems:   Paroxysmal SVT (supraventricular tachycardia) (HCC)   CKD (chronic kidney disease) stage 3, GFR 30-59 ml/min   Sinus bradycardia   Acute lower UTI   Tachycardia   Hypotension   Stroke (HCC)   Adjustment disorder with mixed anxiety and depressed mood  Left hemisphere stroke: -  MRI brain showed Several scattered small acute infarcts in the posterior left hemisphere, The Left PCA and Left MCA/PCA watershed area. No associated hemorrhage or mass effect.  Underlying chronic white matter signal changes most likely due to small vessel disease. Possibly an embolic stroke.  Cardiology consulted for evaluation of atrial fibrillation. No focal deficits.  Stroke work up ordered. MRA head ordered and appears to wnl Carotid duplex Right Carotid: Velocities in the right ICA are consistent with a 1-39% stenosis. Left Carotid: Velocities in the left ICA are consistent with a 1-39%  stenosis.  Echocardiogram showed Left ventricular ejection fraction, by estimation, is 60 to 65%. The left ventricle has normal function. The left ventricle has no regional wall motion abnormalities. There is mild left ventricular hypertrophy. Left ventricular diastolic parameters are consistent with Grade II diastolic dysfunction (pseudonormalization). Elevated left atrial pressure. A1c is 5.9% lipid panel shows LDL of 72, started her on Lipitor 20 mg daily.   Discussed with cardiology and started her on eliquis 2.5 mg BID.  PT evaluation recommending snf with 24 hours supervision.  SLP eval recommending dysphagia 3 diet  Neurology consulted and recommendations given. No new complaints  at this time.   Acute encephalopathy probably secondary to urinary tract infection and medications and stroke.  TSH within normal  limits Vitamin B12 and ammonia are within normal limits Patient was started on IV Rocephin for urinary tract infection. Urine cultures grew 60,000 E. Coli sensitive to keflex. Transitioned to keflex to complete the course.  Completed the course of Keflex.  Hypotension: Probably secondary to decreased oral intake versus dehydration from infection .  Resolved.   Tachybradycardia syndrome/atrial tachycardia/ atrial flutter Asymptomatic. Cardiology consulted, started her on amiodarone 200 mg , consideration 200 mg of amiodarone daily today. Started her on low dose eliquis 2.5 mg BID.    GERD Stable.    Mild AKI Probably secondary to dehydration Patient came in with a creatinine of 1.1 improved to 0.7 with IV fluids.  No new labs today.  Nutrition:  On soft diet.  Dietary consulted.   Pt reported that she is ready to die. She denies any suicidal or homicidal ideations.  Palliative care on board, and recommended outpatient palliative follow up .  Psychiatry consulted, added zoloft for mood improvement.    Right foot pain.  Pain control with tramadol and x rays of the foot ordered.  X-rays of the foot showed mild osteoarthritis of the first metatarsophalangeal joint .   Discharge Instructions  Discharge Instructions    Ambulatory referral to Neurology   Complete by: As directed    Follow up with Dr. Leonie Man at [facility] in 4-6 weeks.  Thanks.     Allergies as of 11/10/2019      Reactions   Azopt [brinzolamide] Other (See Comments)   UNK   Brimonidine Other (See Comments)   UNK   Lumigan [bimatoprost] Other (See Comments)   UNK      Medication List    STOP taking these medications   metoprolol tartrate 25 MG tablet Commonly known as: LOPRESSOR     TAKE these medications   acetaminophen 325 MG tablet Commonly known as: TYLENOL Take 650 mg by mouth every 4 (four) hours as needed for mild pain or headache.   amiodarone 100 MG tablet Commonly known as:  PACERONE Take 1 tablet (100 mg total) by mouth daily. Start taking on: November 11, 2019   apixaban 2.5 MG Tabs tablet Commonly known as: ELIQUIS Take 1 tablet (2.5 mg total) by mouth 2 (two) times daily.   atorvastatin 20 MG tablet Commonly known as: LIPITOR Take 1 tablet (20 mg total) by mouth daily at 6 PM.   b complex vitamins tablet Take 1 tablet by mouth daily.   bismuth subsalicylate 553 ZS/82LM suspension Commonly known as: PEPTO BISMOL Take 60 mLs by mouth daily as needed for indigestion or diarrhea or loose stools.   Calcium Carbonate-Vitamin D 600-400 MG-UNIT tablet Take 1 tablet by mouth daily.   CENTRUM SILVER PO Take 1 tablet by mouth daily. 7am-10am.   diphenoxylate-atropine 2.5-0.025 MG/5ML liquid Commonly known as: LOMOTIL Take by mouth 3 (three) times daily as needed for diarrhea or loose stools.   melatonin 3 MG Tabs tablet Take 3 mg by mouth at bedtime.   memantine 5 MG tablet Commonly known as: NAMENDA Take 5 mg by mouth daily.   NON FORMULARY Moisturizing Cream to extremities with morning and evening care Every Shift   NON FORMULARY Regular Special Instructions: Regular Diet, Thin Liquids   omeprazole 20 MG capsule Commonly known as: PRILOSEC Take 20 mg by mouth daily.   potassium chloride SA 20 MEQ tablet Commonly known  as: KLOR-CON Take 20 mEq by mouth daily.   sertraline 25 MG tablet Commonly known as: ZOLOFT Take 0.5 tablets (12.5 mg total) by mouth at bedtime.   timolol 0.5 % ophthalmic solution Commonly known as: BETIMOL Place 1 drop into both eyes daily. 7am-10am.   torsemide 20 MG tablet Commonly known as: DEMADEX Take 20 mg by mouth daily.       Follow-up Information    Garvin Fila, MD. Schedule an appointment as soon as possible for a visit in 4 week(s).   Specialties: Neurology, Radiology Contact information: [address] [place] [address] [phone]              Allergies  Allergen  Reactions  . Azopt [Brinzolamide] Other (See Comments)    UNK  . Brimonidine Other (See Comments)    UNK  . Lumigan [Bimatoprost] Other (See Comments)    UNK    Consultations:  Cardiology  Neurology  Psychiatry.    Procedures/Studies: DG Chest 2 View  Result Date: 11/02/2019 CLINICAL DATA:  Altered mental status EXAM: CHEST - 2 VIEW COMPARISON:  05/30/2019 FINDINGS: The heart size and mediastinal contours are stable. Mildly hyperexpanded lungs without focal airspace consolidation, pleural effusion, or pneumothorax. The visualized skeletal structures are unremarkable. IMPRESSION: No active cardiopulmonary disease. Electronically Signed   By: Davina Poke D.O.   On: 11/02/2019 11:42   CT Head Wo Contrast  Result Date: 11/02/2019 CLINICAL DATA:  Mental status change EXAM: CT HEAD WITHOUT CONTRAST TECHNIQUE: Contiguous axial images were obtained from the base of the skull through the vertex without intravenous contrast. COMPARISON:  CT head 08/07/2005 FINDINGS: Brain: Moderate atrophy with progression since the prior study. Atrophy most prominent in the frontal lobes. There is ventricular enlargement due to atrophy which has progressed. Periventricular white matter hypodensity bilaterally also has progressed. Negative for acute infarct, hemorrhage, mass. Vascular: Negative for hyperdense vessel Skull: Negative Sinuses/Orbits: Paranasal sinuses clear. Bilateral cataract extraction. Other: None IMPRESSION: No acute abnormality Progression of atrophy and chronic microvascular ischemic change in the white matter compared with 2007. Electronically Signed   By: Franchot Gallo M.D.   On: 11/02/2019 11:32   MR ANGIO HEAD WO CONTRAST  Result Date: 11/05/2019 CLINICAL DATA:  Initial evaluation for acute stroke. EXAM: MRA HEAD WITHOUT CONTRAST TECHNIQUE: Angiographic images of the Circle of Willis were obtained using MRA technique without intravenous contrast. COMPARISON:  Prior brain MRI from  11/04/2019. FINDINGS: ANTERIOR CIRCULATION: Visualized distal cervical segments of the internal carotid arteries are widely patent with symmetric antegrade flow. Petrous, cavernous, and supraclinoid ICAs widely patent without stenosis or other abnormality. Origin of the ophthalmic arteries patent. ICA termini well perfused. A1 segments patent bilaterally. Normal anterior communicating artery complex. Anterior cerebral arteries widely patent to their distal aspects without stenosis. No M1 stenosis or occlusion. Normal MCA bifurcations. Distal MCA branches well perfused and symmetric. POSTERIOR CIRCULATION: Vertebral arteries widely patent to the vertebrobasilar junction without stenosis. Left vertebral artery dominant. Partially visualized posterior inferior cerebral arteries patent bilaterally. Basilar widely patent to its distal aspect without stenosis. Superior cerebral arteries patent bilaterally. Both PCAs primarily supplied via the basilar well perfused to their distal aspects. No intracranial aneurysm or other vascular abnormality. IMPRESSION: Normal intracranial MRA. No large vessel occlusion, hemodynamically significant stenosis, or other vascular abnormality. No aneurysm. Electronically Signed   By: Jeannine Boga M.D.   On: 11/05/2019 19:49   MR BRAIN WO CONTRAST  Result Date: 11/04/2019 CLINICAL DATA:  86 year old female  with altered mental status for 2 days. EXAM: MRI HEAD WITHOUT CONTRAST TECHNIQUE: Multiplanar, multiecho pulse sequences of the brain and surrounding structures were obtained without intravenous contrast. COMPARISON:  Head CT 11/02/2019. FINDINGS: Brain: Patchy, scattered restricted diffusion in the posterior left hemisphere seen in the inferior left parietal lobe cortex (series 5, image 81), left periatrial white matter approaching the splenium of the corpus callosum (image 77), and occipital lobe white matter (image 70). Associated mild T2 and FLAIR hyperintensity in these  areas with no evidence of hemorrhage. No mass effect. No other restricted diffusion. Patchy bilateral periventricular white matter T2 and FLAIR hyperintensity with some areas most resembling chronic white matter lacunar infarcts. But no chronic cortical encephalomalacia identified. No chronic cerebral blood products. No midline shift, mass effect, evidence of mass lesion, ventriculomegaly, extra-axial collection or acute intracranial hemorrhage. Cervicomedullary junction and pituitary are within normal limits. Vascular: Major intracranial vascular flow voids are preserved. Skull and upper cervical spine: Partially visible cervical spine degeneration including ligamentous hypertrophy about the odontoid. Visualized bone marrow signal is within normal limits. Sinuses/Orbits: Postoperative changes to both globes. No acute findings. Other: Mild left mastoid effusion.  Visible pharynx appears normal. IMPRESSION: 1. 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 No associated hemorrhage or mass effect. 2. Underlying chronic white matter signal changes most likely due to small vessel disease. 3. Mild left mastoid effusion, likely postinflammatory. Electronically Signed   By: Genevie Ann M.D.   On: 11/04/2019 20:53   DG Foot 2 Views Right  Result Date: 11/08/2019 CLINICAL DATA:  Plantar foot pain cross the ball of the foot at the first through third metatarsal-phalangeal joint region. EXAM: RIGHT FOOT - 2 VIEW COMPARISON:  None. FINDINGS: Mild pes cavus and hammertoe deformity of the third and fourth phalanges. Trace osteoarthritis of the first metatarsal phalangeal joint. No erosion, periosteal reaction, or bony destruction. No plantar calcaneal spur. No focal soft tissue abnormality. IMPRESSION: 1. Mild osteoarthritis of the first metatarsophalangeal joint. 2. Pes cavus and hammertoe deformity of the third and fourth phalanges. Electronically Signed    By: Keith Rake M.D.   On: 11/08/2019 15:09   ECHOCARDIOGRAM COMPLETE  Result Date: 11/05/2019    ECHOCARDIOGRAM REPORT   Patient Name:   LATANIA BASCOMB Date of Exam: 11/05/2019 Medical Rec #:  [id number]        Height:       60.0 in Accession #:    [id number]       Weight:       80.2 lb Date of Birth:  [birth year]         BSA:          1.266 m Patient Age:    86 years         BP:           117/54 mmHg Patient Gender: F                HR:           52 bpm. Exam Location:  Inpatient Procedure: 2D Echo, Cardiac Doppler and Color Doppler Indications:    COVID-19  History:        Patient has no prior history of Echocardiogram examinations.  Sonographer:    Jonelle Sidle Dance Referring Phys: Lewie Chamber Lamir Racca IMPRESSIONS  1. Left ventricular ejection fraction, by estimation, is 60 to 65%. The left ventricle has normal function. The left ventricle has no regional  wall motion abnormalities. There is mild left ventricular hypertrophy. Left ventricular diastolic parameters are consistent with Grade II diastolic dysfunction (pseudonormalization). Elevated left atrial pressure.  2. Right ventricular systolic function is normal. The right ventricular size is normal. There is mildly elevated pulmonary artery systolic pressure. The estimated right ventricular systolic pressure is 06.3 mmHg.  3. Right atrial size was mildly dilated.  4. The mitral valve is normal in structure. Trivial mitral valve regurgitation.  5. Tricuspid valve regurgitation is moderate to severe.  6. The aortic valve is tricuspid. Aortic valve regurgitation is mild. Mild aortic valve sclerosis is present, with no evidence of aortic valve stenosis.  7. The inferior vena cava is normal in size with greater than 50% respiratory variability, suggesting right atrial pressure of 3 mmHg. FINDINGS  Left Ventricle: Left ventricular ejection fraction, by estimation, is 60 to 65%. The left ventricle has normal function. The left ventricle has no regional wall motion  abnormalities. The left ventricular internal cavity size was small. There is mild left ventricular hypertrophy. Left ventricular diastolic parameters are consistent with Grade II diastolic dysfunction (pseudonormalization). Elevated left atrial pressure. Right Ventricle: The right ventricular size is normal. Right vetricular wall thickness was not assessed. Right ventricular systolic function is normal. There is mildly elevated pulmonary artery systolic pressure. The tricuspid regurgitant velocity is 2.97 m/s, and with an assumed right atrial pressure of 3 mmHg, the estimated right ventricular systolic pressure is 01.6 mmHg. Left Atrium: Left atrial size was normal in size. Right Atrium: Right atrial size was mildly dilated. Pericardium: Trivial pericardial effusion is present. Mitral Valve: The mitral valve is normal in structure. Trivial mitral valve regurgitation. Tricuspid Valve: The tricuspid valve is normal in structure. Tricuspid valve regurgitation is moderate to severe. Aortic Valve: The aortic valve is tricuspid. Aortic valve regurgitation is mild. Aortic regurgitation PHT measures 558 msec. Mild aortic valve sclerosis is present, with no evidence of aortic valve stenosis. Pulmonic Valve: The pulmonic valve was grossly normal. Pulmonic valve regurgitation is trivial. Aorta: The aortic root and ascending aorta are structurally normal, with no evidence of dilitation. Venous: The inferior vena cava is normal in size with greater than 50% respiratory variability, suggesting right atrial pressure of 3 mmHg. IAS/Shunts: The interatrial septum was not well visualized.  LEFT VENTRICLE PLAX 2D LVIDd:         3.21 cm LVIDs:         2.01 cm LV PW:         1.15 cm LV IVS:        0.90 cm LVOT diam:     1.70 cm LV SV:         47 LV SV Index:   37 LVOT Area:     2.27 cm  RIGHT VENTRICLE          IVC RV Basal diam:  2.88 cm  IVC diam: 1.49 cm RV Mid diam:    1.94 cm TAPSE (M-mode): 1.8 cm LEFT ATRIUM             Index        RIGHT ATRIUM           Index LA diam:        3.20 cm 2.53 cm/m  RA Area:     13.90 cm LA Vol (A2C):   44.4 ml 35.07 ml/m RA Volume:   34.40 ml  27.17 ml/m LA Vol (A4C):   35.0 ml 27.64 ml/m LA Biplane Vol: 40.9 ml 32.30 ml/m  AORTIC VALVE LVOT Vmax:   94.80 cm/s LVOT Vmean:  63.000 cm/s LVOT VTI:    0.205 m AI PHT:      558 msec  AORTA Ao Root diam: 2.80 cm Ao Asc diam:  2.70 cm MITRAL VALVE                TRICUSPID VALVE MV Area (PHT): 3.21 cm     TR Peak grad:   35.3 mmHg MV Decel Time: 236 msec     TR Vmax:        297.00 cm/s MV E velocity: 102.00 cm/s MV A velocity: 68.30 cm/s   SHUNTS MV E/A ratio:  1.49         Systemic VTI:  0.20 m                             Systemic Diam: 1.70 cm Oswaldo Milian MD Electronically signed by Oswaldo Milian MD Signature Date/Time: 11/05/2019/5:59:08 PM    Final    VAS US CAROTID  Result Date: 11/09/2019 Carotid Arterial Duplex Study Indications:       CVA. Comparison Study:  no prior Performing Technologist: Abram Sander RVS  Examination Guidelines: A complete evaluation includes B-mode imaging, spectral Doppler, color Doppler, and power Doppler as needed of all accessible portions of each vessel. Bilateral testing is considered an integral part of a complete examination. Limited examinations for reoccurring indications may be performed as noted.  Right Carotid Findings: +----------+--------+--------+--------+------------------+--------+           PSV cm/sEDV cm/sStenosisPlaque DescriptionComments +----------+--------+--------+--------+------------------+--------+ CCA Prox  59      12              heterogenous               +----------+--------+--------+--------+------------------+--------+ CCA Distal52      7               heterogenous               +----------+--------+--------+--------+------------------+--------+ ICA Prox  34      10      1-39%   heterogenous                +----------+--------+--------+--------+------------------+--------+ ICA Distal47      11                                         +----------+--------+--------+--------+------------------+--------+ ECA       55                                                 +----------+--------+--------+--------+------------------+--------+ +----------+--------+-------+--------+-------------------+           PSV cm/sEDV cmsDescribeArm Pressure (mmHG) +----------+--------+-------+--------+-------------------+ [id number]                                         +----------+--------+-------+--------+-------------------+ +---------+--------+--+--------+--+---------+ VertebralPSV cm/s52EDV cm/s14Antegrade +---------+--------+--+--------+--+---------+  Left Carotid Findings: +----------+--------+--------+--------+------------------+--------+           PSV cm/sEDV cm/sStenosisPlaque DescriptionComments +----------+--------+--------+--------+------------------+--------+ CCA Prox  57      14  heterogenous               +----------+--------+--------+--------+------------------+--------+ CCA Distal63      12              heterogenous               +----------+--------+--------+--------+------------------+--------+ ICA Prox  59      13      1-39%   heterogenous      tortuous +----------+--------+--------+--------+------------------+--------+ ICA Distal86      14                                         +----------+--------+--------+--------+------------------+--------+ ECA       77                                                 +----------+--------+--------+--------+------------------+--------+ +----------+--------+--------+--------+-------------------+           PSV cm/sEDV cm/sDescribeArm Pressure (mmHG) +----------+--------+--------+--------+-------------------+ [id number]                                           +----------+--------+--------+--------+-------------------+ +---------+--------+--+--------+-+---------+ VertebralPSV cm/s86EDV cm/s9Antegrade +---------+--------+--+--------+-+---------+   Summary: Right Carotid: Velocities in the right ICA are consistent with a 1-39% stenosis. Left Carotid: Velocities in the left ICA are consistent with a 1-39% stenosis. Vertebrals: Bilateral vertebral arteries demonstrate antegrade flow. *See table(s) above for measurements and observations.  Electronically signed by Antony Contras MD on 11/09/2019 at 1:20:26 PM.    Final        Subjective:  No new complaints.  Discharge Exam: Vitals:   11/10/19 0530 11/10/19 1421  BP: (!) 113/52 126/64  Pulse: (!) 53 61  Resp: 17 17  Temp: 98.5 F (36.9 C) (!) 97.4 F (36.3 C)  SpO2: 96% 97%   Vitals:   11/09/19 1239 11/09/19 2111 11/10/19 0530 11/10/19 1421  BP: 107/60 (!) 125/57 (!) 113/52 126/64  Pulse: (!) 52 (!) 59 (!) 53 61  Resp: 17 20 17 17   Temp: 98.2 F (36.8 C) 98.6 F (37 C) 98.5 F (36.9 C) (!) 97.4 F (36.3 C)  TempSrc: Oral Oral Oral Oral  SpO2: 97% 98% 96% 97%  Weight:      Height:        General: Pt is alert, awake, not in acute distress Cardiovascular: RRR, S1/S2 +, no rubs, no gallops Respiratory: CTA bilaterally, no wheezing, no rhonchi Abdominal: Soft, NT, ND, bowel sounds + Extremities: no edema, no cyanosis    The results of significant diagnostics from this hospitalization (including imaging, microbiology, ancillary and laboratory) are listed below for reference.     Microbiology: Recent Results (from the past 240 hour(s))  Urine culture     Status: Abnormal   Collection Time: 11/02/19 10:47 AM   Specimen: Urine, Random  Result Value Ref Range Status   Specimen Description   Final    URINE, RANDOM Performed at [facility], [address] [place],  [address]    Special Requests   Final    NONE Performed at [facility], [place]  [address], [place], [place] [address]    Culture 60,000 COLONIES/mL ESCHERICHIA COLI (A)  Final   Report Status 11/05/2019 FINAL  Final   Organism ID, Bacteria ESCHERICHIA COLI (A)  Final      Susceptibility   Escherichia coli - MIC*    AMPICILLIN >=32 RESISTANT Resistant     CEFAZOLIN 16 SENSITIVE Sensitive     CEFTRIAXONE <=0.25 SENSITIVE Sensitive     CIPROFLOXACIN <=0.25 SENSITIVE Sensitive     GENTAMICIN <=1 SENSITIVE Sensitive     IMIPENEM <=0.25 SENSITIVE Sensitive     NITROFURANTOIN 32 SENSITIVE Sensitive     TRIMETH/SULFA >=320 RESISTANT Resistant     AMPICILLIN/SULBACTAM >=32 RESISTANT Resistant     PIP/TAZO <=4 SENSITIVE Sensitive     * 60,000 COLONIES/mL ESCHERICHIA COLI  Blood culture (routine x 2)     Status: None   Collection Time: 11/02/19  2:38 PM   Specimen: BLOOD  Result Value Ref Range Status   Specimen Description   Final    BLOOD RIGHT ANTECUBITAL Performed at [facility], [place] [address] [place], [place] [address]    Special Requests   Final    BOTTLES DRAWN AEROBIC ONLY Blood Culture adequate volume Performed at [place] [address] [place], [place] [address]    Culture   Final    NO GROWTH 5 DAYS Performed at [facility], [place] [address] [place], [place] [address]    Report Status 11/07/2019 FINAL  Final  SARS Coronavirus 2 by RT PCR (hospital order, performed in [facility] hospital lab) Nasopharyngeal Nasopharyngeal Swab     Status: None   Collection Time: 11/02/19  2:40 PM   Specimen: Nasopharyngeal Swab  Result Value Ref Range Status   SARS Coronavirus 2 NEGATIVE NEGATIVE Final    Comment: (NOTE) SARS-CoV-2 target nucleic acids are NOT DETECTED.  The SARS-CoV-2 RNA is generally detectable in upper and lower respiratory specimens during the acute phase of infection. The lowest concentration of SARS-CoV-2 viral copies this assay can detect is 250 copies / mL. A negative result does not preclude SARS-CoV-2  infection and should not be used as the sole basis for treatment or other patient management decisions.  A negative result may occur with improper specimen collection / handling, submission of specimen other than nasopharyngeal swab, presence of viral mutation(s) within the areas targeted by this assay, and inadequate number of viral copies (<250 copies / mL). A negative result must be combined with clinical observations, patient history, and epidemiological information.  Fact Sheet for Patients:   StrictlyIdeas.no  Fact Sheet for Healthcare Providers: BankingDealers.co.za  This test is not yet approved or  cleared by the [place] FDA and has been authorized for detection and/or diagnosis of SARS-CoV-2 by FDA under an Emergency Use Authorization (EUA).  This EUA will remain in effect (meaning this test can be used) for the duration of the COVID-19 declaration under Section 564(b)(1) of the Act, 21 U.S.C. section 360bbb-3(b)(1), unless the authorization is terminated or revoked sooner.  Performed at [facility], [place] [address] [place], [place] [address]   MRSA PCR Screening     Status: None   Collection Time: 11/04/19  2:02 PM   Specimen: Nasal Mucosa; Nasopharyngeal  Result Value Ref Range Status   MRSA by PCR NEGATIVE NEGATIVE Final    Comment:        The GeneXpert MRSA Assay (FDA approved for NASAL specimens only), is one component of a comprehensive MRSA colonization surveillance program. It is not intended to diagnose MRSA infection nor to  guide or monitor treatment for MRSA infections. Performed at [facility], [place] [address] [place], [place] [address]   SARS Coronavirus 2 by RT PCR (hospital order, performed in [facility] hospital lab) Nasopharyngeal Nasopharyngeal Swab     Status: None   Collection Time: 11/09/19 12:15 PM   Specimen: Nasopharyngeal Swab  Result Value Ref  Range Status   SARS Coronavirus 2 NEGATIVE NEGATIVE Final    Comment: (NOTE) SARS-CoV-2 target nucleic acids are NOT DETECTED.  The SARS-CoV-2 RNA is generally detectable in upper and lower respiratory specimens during the acute phase of infection. The lowest concentration of SARS-CoV-2 viral copies this assay can detect is 250 copies / mL. A negative result does not preclude SARS-CoV-2 infection and should not be used as the sole basis for treatment or other patient management decisions.  A negative result may occur with improper specimen collection / handling, submission of specimen other than nasopharyngeal swab, presence of viral mutation(s) within the areas targeted by this assay, and inadequate number of viral copies (<250 copies / mL). A negative result must be combined with clinical observations, patient history, and epidemiological information.  Fact Sheet for Patients:   StrictlyIdeas.no  Fact Sheet for Healthcare Providers: BankingDealers.co.za  This test is not yet approved or  cleared by the [place] FDA and has been authorized for detection and/or diagnosis of SARS-CoV-2 by FDA under an Emergency Use Authorization (EUA).  This EUA will remain in effect (meaning this test can be used) for the duration of the COVID-19 declaration under Section 564(b)(1) of the Act, 21 U.S.C. section 360bbb-3(b)(1), unless the authorization is terminated or revoked sooner.  Performed at [facility], [place] [address] [place], [place] [address]      Labs: BNP (last 3 results) No results for input(s): BNP in the last 8760 hours. Basic Metabolic Panel: Recent Labs  Lab 11/04/19 0535 11/06/19 0544  NA 141 138  K 3.6 3.9  CL 107 107  CO2 24 23  GLUCOSE 92 93  BUN 25* 9  CREATININE 0.86 0.72  CALCIUM 8.2* 8.1*   Liver Function Tests: Recent Labs  Lab 11/06/19 0544  AST 34  ALT 18  ALKPHOS 45  BILITOT  0.3  PROT 5.1*  ALBUMIN 2.9*   No results for input(s): LIPASE, AMYLASE in the last 168 hours. No results for input(s): AMMONIA in the last 168 hours. CBC: Recent Labs  Lab 11/06/19 0544  WBC 4.3  HGB 12.8  HCT 41.1  MCV 97.4  PLT 206   Cardiac Enzymes: No results for input(s): CKTOTAL, CKMB, CKMBINDEX, TROPONINI in the last 168 hours. BNP: Invalid input(s): POCBNP CBG: No results for input(s): GLUCAP in the last 168 hours. D-Dimer No results for input(s): DDIMER in the last 72 hours. Hgb A1c No results for input(s): HGBA1C in the last 72 hours. Lipid Profile No results for input(s): CHOL, HDL, LDLCALC, TRIG, CHOLHDL, LDLDIRECT in the last 72 hours. Thyroid function studies No results for input(s): TSH, T4TOTAL, T3FREE, THYROIDAB in the last 72 hours.  Invalid input(s): FREET3 Anemia work up No results for input(s): VITAMINB12, FOLATE, FERRITIN, TIBC, IRON, RETICCTPCT in the last 72 hours. Urinalysis    Component Value Date/Time   COLORURINE YELLOW 11/02/2019 1045   APPEARANCEUR CLEAR 11/02/2019 1045   LABSPEC 1.008 11/02/2019 1045   PHURINE 5.0 11/02/2019 [place] 11/02/2019 [place] 11/02/2019 [place] 11/02/2019 [place] 11/02/2019 1045  PROTEINUR NEGATIVE 11/02/2019 1045   UROBILINOGEN 0.2 09/18/2009 1810   NITRITE NEGATIVE 11/02/2019 1045   LEUKOCYTESUR LARGE (A) 11/02/2019 1045   Sepsis Labs Invalid input(s): PROCALCITONIN,  WBC,  LACTICIDVEN Microbiology Recent Results (from the past 240 hour(s))  Urine culture     Status: Abnormal   Collection Time: 11/02/19 10:47 AM   Specimen: Urine, Random  Result Value Ref Range Status   Specimen Description   Final    URINE, RANDOM Performed at [facility], [place] [address], [place], [place] [address]    Special Requests   Final    NONE Performed at [facility], [place] [address], [place], [place] [address]    Culture  60,000 COLONIES/mL ESCHERICHIA COLI (A)  Final   Report Status 11/05/2019 FINAL  Final   Organism ID, Bacteria ESCHERICHIA COLI (A)  Final      Susceptibility   Escherichia coli - MIC*    AMPICILLIN >=32 RESISTANT Resistant     CEFAZOLIN 16 SENSITIVE Sensitive     CEFTRIAXONE <=0.25 SENSITIVE Sensitive     CIPROFLOXACIN <=0.25 SENSITIVE Sensitive     GENTAMICIN <=1 SENSITIVE Sensitive     IMIPENEM <=0.25 SENSITIVE Sensitive     NITROFURANTOIN 32 SENSITIVE Sensitive     TRIMETH/SULFA >=320 RESISTANT Resistant     AMPICILLIN/SULBACTAM >=32 RESISTANT Resistant     PIP/TAZO <=4 SENSITIVE Sensitive     * 60,000 COLONIES/mL ESCHERICHIA COLI  Blood culture (routine x 2)     Status: None   Collection Time: 11/02/19  2:38 PM   Specimen: BLOOD  Result Value Ref Range Status   Specimen Description   Final    BLOOD RIGHT ANTECUBITAL Performed at [facility], [place] [address], [place], [place] [address]    Special Requests   Final    BOTTLES DRAWN AEROBIC ONLY Blood Culture adequate volume Performed at [place] [address] [place], [place] [address]    Culture   Final    NO GROWTH 5 DAYS Performed at [facility], [place] [address] [place], [place] [address]    Report Status 11/07/2019 FINAL  Final  SARS Coronavirus 2 by RT PCR (hospital order, performed in [facility] hospital lab) Nasopharyngeal Nasopharyngeal Swab     Status: None   Collection Time: 11/02/19  2:40 PM   Specimen: Nasopharyngeal Swab  Result Value Ref Range Status   SARS Coronavirus 2 NEGATIVE NEGATIVE Final    Comment: (NOTE) SARS-CoV-2 target nucleic acids are NOT DETECTED.  The SARS-CoV-2 RNA is generally detectable in upper and lower respiratory specimens during the acute phase of infection. The lowest concentration of SARS-CoV-2 viral copies this assay can detect is 250 copies / mL. A negative result does not preclude SARS-CoV-2 infection and should not be used as the sole  basis for treatment or other patient management decisions.  A negative result may occur with improper specimen collection / handling, submission of specimen other than nasopharyngeal swab, presence of viral mutation(s) within the areas targeted by this assay, and inadequate number of viral copies (<250 copies / mL). A negative result must be combined with clinical observations, patient history, and epidemiological information.  Fact Sheet for Patients:   StrictlyIdeas.no  Fact Sheet for Healthcare Providers: BankingDealers.co.za  This test is not yet approved or  cleared by the [place] FDA and has been authorized for detection and/or diagnosis of SARS-CoV-2 by FDA under an Emergency Use Authorization (EUA).  This  EUA will remain in effect (meaning this test can be used) for the duration of the COVID-19 declaration under Section 564(b)(1) of the Act, 21 U.S.C. section 360bbb-3(b)(1), unless the authorization is terminated or revoked sooner.  Performed at [facility], [place] [address], [place], [place] [address]   MRSA PCR Screening     Status: None   Collection Time: 11/04/19  2:02 PM   Specimen: Nasal Mucosa; Nasopharyngeal  Result Value Ref Range Status   MRSA by PCR NEGATIVE NEGATIVE Final    Comment:        The GeneXpert MRSA Assay (FDA approved for NASAL specimens only), is one component of a comprehensive MRSA colonization surveillance program. It is not intended to diagnose MRSA infection nor to guide or monitor treatment for MRSA infections. Performed at [facility], [place] [address], [place], [place] [address]   SARS Coronavirus 2 by RT PCR (hospital order, performed in [facility] hospital lab) Nasopharyngeal Nasopharyngeal Swab     Status: None   Collection Time: 11/09/19 12:15 PM   Specimen: Nasopharyngeal Swab  Result Value Ref Range Status   SARS Coronavirus 2 NEGATIVE  NEGATIVE Final    Comment: (NOTE) SARS-CoV-2 target nucleic acids are NOT DETECTED.  The SARS-CoV-2 RNA is generally detectable in upper and lower respiratory specimens during the acute phase of infection. The lowest concentration of SARS-CoV-2 viral copies this assay can detect is 250 copies / mL. A negative result does not preclude SARS-CoV-2 infection and should not be used as the sole basis for treatment or other patient management decisions.  A negative result may occur with improper specimen collection / handling, submission of specimen other than nasopharyngeal swab, presence of viral mutation(s) within the areas targeted by this assay, and inadequate number of viral copies (<250 copies / mL). A negative result must be combined with clinical observations, patient history, and epidemiological information.  Fact Sheet for Patients:   StrictlyIdeas.no  Fact Sheet for Healthcare Providers: BankingDealers.co.za  This test is not yet approved or  cleared by the [place] FDA and has been authorized for detection and/or diagnosis of SARS-CoV-2 by FDA under an Emergency Use Authorization (EUA).  This EUA will remain in effect (meaning this test can be used) for the duration of the COVID-19 declaration under Section 564(b)(1) of the Act, 21 U.S.C. section 360bbb-3(b)(1), unless the authorization is terminated or revoked sooner.  Performed at [facility], [place] [address] [place], [place] [address]      Time coordinating discharge: 32 minutes.  SIGNED:   Hosie Poisson, MD  [facility] 11/10/2019, 3:50 PM

## 2019-11-11 NOTE — Progress Notes (Signed)
Occupational Therapy Progress Note  Patient requires min A throughout functional transfers and ambulation to/from sink, then to recliner for safety due to tremulousness as patient is very fearful of falling. Also lets go of walker prematurely before close enough to sink during grooming/hygiene, and again with transfer to recliner requiring cues for safety. At this time patient requiring supervision/assistance for all transfers and self care therefore updated discharge recommendation to venue listed below.    11/11/19 1500  OT Visit Information  Last OT Received On 11/11/19  Assistance Needed +1  History of Present Illness 86 yo female admitted with AMS possibly due to UTI, hypotension, bradycardia, R foot pain. Hx of dementia, SVT, chronic pain, COVID, CKD, chronic diarrhea.  MRI: Several scattered small acute infarcts in the posterior left hemisphere, The Left PCA and Left MCA/PCA watershed area  Precautions  Precautions Fall  Precaution Comments pt very fearful of falling  Pain Assessment  Pain Assessment Faces  Faces Pain Scale 2  Pain Location R 1st metatarsal head (callus) with pressure  Pain Descriptors / Indicators Discomfort;Sore;Tender  Pain Intervention(s) Monitored during session  Cognition  Arousal/Alertness Awake/alert  Behavior During Therapy Anxious  Overall Cognitive Status History of cognitive impairments - at baseline  General Comments fearful of falling, hx of dementia  ADL  Overall ADL's  Needs assistance/impaired  Grooming Oral care;Wash/dry face;Brushing hair;Min guard;Standing;Sitting;Set up  Grooming Details (indicate cue type and reason) min guard in standing for oral care, reports feeling tired transitioned to chair to complete tasks seated in recliner with set up assist  Lower Body Dressing Minimal assistance;Sitting/lateral leans  Lower Body Dressing Details (indicate cue type and reason) patient able to don shoes with increased time, min A due to difficulty  tying L shoe  Toilet Transfer Minimal assistance;Cueing for safety;Cueing for sequencing;Ambulation;RW;BSC  Toilet Transfer Details (indicate cue type and reason) simulated with functional mobility and transfer to chair, patient is tremulous letting go of walker prematurely before aligned with chair needing cues for safety  Functional mobility during ADLs Minimal assistance;Rolling walker  Bed Mobility  Overal bed mobility Needs Assistance  Bed Mobility Supine to Sit  Supine to sit Supervision  Balance  Overall balance assessment Needs assistance  Sitting-balance support No upper extremity supported;Feet supported  Sitting balance-Leahy Scale Good  Standing balance support Bilateral upper extremity supported;During functional activity  Standing balance-Leahy Scale Poor  Standing balance comment reliant on RW for steadying  Transfers  Overall transfer level Needs assistance  Equipment used Rolling walker (2 wheeled)  Transfers Sit to/from Stand  Sit to Stand Min assist  General transfer comment min A for safety due to tremulousness- fearful of falling. patient lets go of walker prematurely before close enough to sink, lined up with recliner  OT - End of Session  Equipment Utilized During Treatment Rolling walker  Activity Tolerance Patient tolerated treatment well  Patient left in chair;with call bell/phone within reach;with chair alarm set  Nurse Communication Mobility status  OT Assessment/Plan  OT Plan Frequency needs to be updated  OT Visit Diagnosis Unsteadiness on feet (R26.81);Pain  Pain - Right/Left Right  Pain - part of body Ankle and joints of foot  OT Frequency (ACUTE ONLY) Min 2X/week  Follow Up Recommendations SNF;Supervision/Assistance - 24 hour  OT Equipment None recommended by OT  AM-PAC OT "6 Clicks" Daily Activity Outcome Measure (Version 2)  Help from another person eating meals? 4  Help from another person taking care of personal grooming? 3  Help from another  person toileting, which includes using toliet, bedpan, or urinal? 3  Help from another person bathing (including washing, rinsing, drying)? 3  Help from another person to put on and taking off regular upper body clothing? 3  Help from another person to put on and taking off regular lower body clothing? 3  6 Click Score 19  OT Goal Progression  Progress towards OT goals Progressing toward goals  Acute Rehab OT Goals  Patient Stated Goal I don't want to fall again  OT Goal Formulation With patient  Time For Goal Achievement 11/18/19  Potential to Achieve Goals Fair  ADL Goals  Pt Will Perform Lower Body Dressing with supervision  Pt Will Transfer to Toilet with supervision;ambulating;regular height toilet  Pt Will Perform Toileting - Clothing Manipulation and hygiene with supervision;sit to/from stand  Additional ADL Goal #1 Patient will stand at sink to perform grooming task without reporting fear of falling.  OT Time Calculation  OT Start Time (ACUTE ONLY) 1325  OT Stop Time (ACUTE ONLY) 1351  OT Time Calculation (min) 26 min  OT General Charges  $OT Visit 1 Visit  OT Treatments  $Self Care/Home Management  23-37 mins   Delbert Phenix OT OT pager: [phone]

## 2019-11-11 NOTE — NC FL2 (Addendum)
[place] LEVEL OF CARE SCREENING TOOL     IDENTIFICATION  Patient Name: Bianca [name] Birthdate: 11-17-1922 Sex: female Admission Date (Current Location): 11/02/2019  [facility] and [place] Number:  Herbalist and Address:  [facility],  [place] [address] [place]      Provider Number: [id number]  Attending Physician Name and Address:  Deatra James, MD  Relative Name and Phone Number:  Liliane Channel nephew [phone]    Current Level of Care: Hospital Recommended Level of Care: [place] Prior Approval Number:    Date Approved/Denied:   PASRR Number: [id number]  Discharge Plan: SNF    Current Diagnoses: Patient Active Problem List   Diagnosis Date Noted  . Adjustment disorder with mixed anxiety and depressed mood 11/07/2019  . Stroke ([place]) 11/05/2019  . Tachycardia 11/03/2019  . Hypotension 11/03/2019  . Altered mental status 11/02/2019  . Sinus bradycardia 11/02/2019  . Acute lower UTI 11/02/2019  . Memory deficit 10/26/2019  . CKD (chronic kidney disease) stage 3, GFR 30-59 ml/min 09/30/2019  . Edema 06/11/2019  . Metatarsalgia 06/11/2019  . GERD (gastroesophageal reflux disease) 05/14/2019  . Paroxysmal SVT (supraventricular tachycardia) ([place]) 04/27/2019  . Pneumonia due to COVID-19 virus 04/27/2019  . Chronic diarrhea 04/27/2019  . Urinary frequency 04/27/2019  . Protein-calorie malnutrition ([place]) 04/27/2019  . Emphysema of lung ([place]) 04/27/2019    Orientation RESPIRATION BLADDER Height & Weight     Self, Place  Normal Continent Weight: (!) 36.4 kg Height:  5' (152.4 cm)  BEHAVIORAL SYMPTOMS/MOOD NEUROLOGICAL BOWEL NUTRITION STATUS      Continent Diet (Soft)  AMBULATORY STATUS COMMUNICATION OF NEEDS Skin   Extensive Assist Verbally Skin abrasions                       Personal Care Assistance Level of Assistance  Bathing, Feeding, Dressing Bathing Assistance: Maximum  assistance Feeding assistance: Limited assistance Dressing Assistance: Maximum assistance     Functional Limitations Info  Sight, Speech, Hearing Sight Info: Impaired Hearing Info: Adequate Speech Info: Adequate    SPECIAL CARE FACTORS FREQUENCY  PT (By licensed PT), OT (By licensed OT)     PT Frequency: 5x/week OT Frequency: 5x/week            Contractures Contractures Info: Not present    Additional Factors Info  Code Status, Allergies Code Status Info: DNR Allergies Info: Allergies: Azopt Brinzolamide, Brimonidine, Lumigan Bimatoprost           Current Medications (11/11/2019):  This is the current hospital active medication list Current Facility-Administered Medications  Medication Dose Route Frequency Provider Last Rate Last Admin  . acetaminophen (TYLENOL) tablet 650 mg  650 mg Oral Q6H PRN Harold Hedge, MD   650 mg at 11/11/19 3016   Or  . acetaminophen (TYLENOL) suppository 650 mg  650 mg Rectal Q6H PRN Harold Hedge, MD      . amiodarone (PACERONE) tablet 100 mg  100 mg Oral Daily Hosie Poisson, MD   100 mg at 11/11/19 0913  . apixaban (ELIQUIS) tablet 2.5 mg  2.5 mg Oral BID Hosie Poisson, MD   2.5 mg at 11/11/19 0913  . atorvastatin (LIPITOR) tablet 20 mg  20 mg Oral q1800 Hosie Poisson, MD   20 mg at 11/10/19 1701  . bismuth subsalicylate (PEPTO BISMOL) 262 MG/15ML suspension 60 mL  60 mL Oral Daily PRN Harold Hedge, MD      .  diphenoxylate-atropine (LOMOTIL) 2.5-0.025 MG/5ML liquid 5 mL  5 mL Oral TID PRN Harold Hedge, MD   5 mL at 11/10/19 0134  . melatonin tablet 3 mg  3 mg Oral QHS Harold Hedge, MD   3 mg at 11/10/19 2120  . pantoprazole (PROTONIX) EC tablet 40 mg  40 mg Oral Daily Harold Hedge, MD   40 mg at 11/11/19 0912  . sertraline (ZOLOFT) tablet 12.5 mg  12.5 mg Oral QHS Hosie Poisson, MD   12.5 mg at 11/10/19 2121  . timolol (TIMOPTIC) 0.5 % ophthalmic solution 1 drop  1 drop Both Eyes Daily Harold Hedge, MD   1 drop at 11/11/19 0912   . traMADol (ULTRAM) tablet 50 mg  50 mg Oral Q12H PRN Hosie Poisson, MD   50 mg at 11/10/19 2123     Discharge Medications: Please see discharge summary for a list of discharge medications.  Relevant Imaging Results:  Relevant Lab Results:   Additional Information SS#[id number] [phone]  Sadiya Durand, Juliann Pulse, RN

## 2019-11-11 NOTE — TOC Progression Note (Addendum)
Transition of Care [facility]) - Progression Note    Patient Details  Name: Bianca [name] MRN: [id number] Date of Birth: [birth year]  [facility]) CM/SW Contact  Kabrina Christiano, Juliann Pulse, RN Phone Number: 11/11/2019, 10:12 AM  Clinical Narrative: 11:02a-clarifying-Received telephone call from [facility] review-she has just started the review for the PASRR-currently awaiting [facility] will accept once PASRR in place-Covid neg 8/2.MD updated.  Received Pasrr-awaiting call back from [place] if able to return back. Covid neg 8/2.      Expected Discharge Plan: Skilled Nursing Facility Barriers to Discharge: [place] [facility])  Expected Discharge Plan and Services Expected Discharge Plan: [place]   Discharge Planning Services: CM Consult Post Acute Care Choice:  (ALF) Living arrangements for the past 2 months: Assisted Living Facility                                       Social Determinants of Health (SDOH) Interventions    Readmission Risk Interventions No flowsheet data found.

## 2019-11-11 NOTE — Care Plan (Signed)
Follow-up visit, pt alert and aware sitting up in bed. She states she is doing okay. I told her that  I'm the one who sang to her the other day,. She then remember me. The chaplain offered caring and supportive presence, sacred music, prayers and blessings. Further visits will be offered.

## 2019-11-11 NOTE — Progress Notes (Signed)
Physical Therapy Treatment Patient Details Name: Bianca [name] MRN: [id number] DOB: 21-Apr-1922 Today's Date: 11/11/2019    History of Present Illness 86 yo female admitted with AMS possibly due to UTI, hypotension, bradycardia, R foot pain. Hx of dementia, SVT, chronic pain, COVID, CKD, chronic diarrhea.  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 Ambulation distance limited per pt request to return to room duet o fear of falling. Pt tolerates standing and seated strengthening exercises with intermittent cues. Pt tolerates remaining up in chair with alarm on and all needs in reach. Patient will benefit from continued physical therapy in hospital and recommendations below to increase strength, balance, endurance for safe ADLs and gait.    Follow Up Recommendations  SNF;Supervision/Assistance - 24 hour     Equipment Recommendations  Rolling walker with 5" wheels    Recommendations for Other Services       Precautions / Restrictions Precautions Precautions: Fall Precaution Comments: pt very fearful of falling Restrictions Other Position/Activity Restrictions: bil 1st metatarsal head calluses and pain with right so wear shoes    Mobility  Bed Mobility  General bed mobility comments: in chair upon arrival  Transfers Overall transfer level: Needs assistance Equipment used: Rolling walker (2 wheeled) Transfers: Sit to/from Stand Sit to Stand: Min assist  General transfer comment: min A to power up, BUE assisting, cues for pushing through bil flat feet and shifting weight forward to decrease posterior weight shift  Ambulation/Gait Ambulation/Gait assistance: Min assist Gait Distance (Feet): 60 Feet Assistive device: Rolling  walker (2 wheeled) Gait Pattern/deviations: Step-to pattern;Decreased stride length;Narrow base of support Gait velocity: decreased   General Gait Details: cues to increase bil step length and foot clearance, very fearful of falling and unable to lessen grip on RW despite cues, no LOB or near falls   Stairs             Wheelchair Mobility    Modified Rankin (Stroke Patients Only)       Balance Overall balance assessment: Needs assistance  Standing balance support: Bilateral upper extremity supported;During functional activity Standing balance-Leahy Scale: Poor Standing balance comment: reliant on RW for steadying         Cognition Arousal/Alertness: Awake/alert Behavior During Therapy: [facility] for tasks assessed/performed;Anxious Overall Cognitive Status: History of cognitive impairments - at baseline  General Comments: hx of dementia; pt very fearful of falling, repeatedly asks therapists "hold onto me" and "don't let go"      Exercises General Exercises - Lower Extremity Straight Leg Raises: Strengthening;Seated;Both;15 reps Hip Flexion/Marching: Strengthening;Standing;Both;15 reps (BUE support on RW) Heel Raises: Strengthening;Standing;Both;15 reps (BUE support on RW)    General Comments        Pertinent Vitals/Pain Pain Assessment: No/denies pain    Home Living                      Prior Function            PT Goals (current goals can now be found in the care plan section) Acute Rehab PT Goals Patient Stated Goal: I don't want to fall again PT Goal Formulation: With patient Time For Goal Achievement: 11/18/19 Potential to Achieve Goals: Good Progress towards PT goals: Progressing toward goals  Frequency    Min 2X/week      PT Plan Current plan remains appropriate    Co-evaluation              AM-PAC PT "6 Clicks" Mobility   Outcome Measure  Help needed turning from your back to your side while in a flat bed without using  bedrails?: None Help needed moving from lying on your back to sitting on the side of a flat bed without using bedrails?: None Help needed moving to and from a bed to a chair (including a wheelchair)?: A Little Help needed standing up from a chair using your arms (e.g., wheelchair or bedside chair)?: A Little Help needed to walk in hospital room?: A Little Help needed climbing 3-5 steps with a railing? : A Lot 6 Click Score: 19    End of Session Equipment Utilized During Treatment: Gait belt Activity Tolerance: Patient tolerated treatment well Patient left: in chair;with call bell/phone within reach;with chair alarm set   PT Visit Diagnosis: Muscle weakness (generalized) (M62.81);Unsteadiness on feet (R26.81);Difficulty in walking, not elsewhere classified (R26.2);Other symptoms and signs involving the nervous system (K99.833)     Time: 1400-1415 PT Time Calculation (min) (ACUTE ONLY): 15 min  Charges:  $Therapeutic Exercise: 8-22 mins                      Tori Kodie Kishi PT, DPT 11/11/19, 2:54 PM

## 2019-11-11 NOTE — Progress Notes (Signed)
Physician Discharge Summary  Bianca [name] JSE:[id number] DOB: 12-15-1922 DOA: 11/02/2019  PCP: [name], [name], NP  Admit date: 11/02/2019 Discharge date: 11/11/2019  Admitted From: alf Disposition:  [facility].   Recommendations for Outpatient Follow-up:  1. Follow up with PCP in 1-2 weeks 2. Please obtain BMP/CBC in one week Please follow up with neurology as recommended.  Please follow up with Dr Einar Gip in 2 to 4 weeks.   Discharge Condition:stable.  CODE STATUS:DNR Diet recommendation: Heart Healthy  The patient was seen and examined this morning.  Awake alert, having breakfast no issues overnight. All vitals, labs, notes were reviewed... No changes discharge summary  Disposition -pending discharge to SNF-due to  PASRR pending   ------------------------------------------------------------------------------------------------------------------------------------ Brief/Interim Summary:  86 year old lady with prior history of SVT on metoprolol, GERD presents from ALF with altered mental status.  On arrival to ED she was found to be bradycardic.  EKG shows sinus bradycardia.  Hospital course was complicated by transient tachycardia in the setting of holding home beta-blockers for paroxysmal SVT. Currently she remains bradycardic and asymptomatic from the bradycardia.  She also underwent CT head without contrast initially, which was negative for stroke. She did not have any focal decifits.  PT evaluation recommending Home health PT. SLP evaluation recommending dysphagia 3 diet with thin liquid. In view of her acute confusion , MRI of the brain was done, showed multiple acute strokes in the left hemisphere.  Neurology consulted, requested transfer the patient to [facility]. She couldn't be transferred due to bed unavailability. Neurology consulted and underwent stroke work up at [facility].  Her hospital course was complicated by tachybradycardia syndrome and atrial tachycardia at which point cardiology  was consulted.  She was started on low-dose amiodarone 200 mg daily.  Her rates been controlled so far. Patient complained of right foot pain.  X-rays of the foot ordered for further evaluation.  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 Physical therapy evaluation recommending SNF.  TOC on board and [facility] Patient seen and examined at bedside new complaints at this time wanted to know when she can be discharged.  Discharge Diagnoses:  Principal Problem:   Altered mental status Active Problems:   Paroxysmal SVT (supraventricular tachycardia) (HCC)   CKD (chronic kidney disease) stage 3, GFR 30-59 ml/min   Sinus bradycardia   Acute lower UTI   Tachycardia   Hypotension   Stroke (HCC)   Adjustment disorder with mixed anxiety and depressed mood  Left hemisphere stroke: -  MRI brain showed Several scattered small acute infarcts in the posterior left hemisphere, The Left PCA and Left MCA/PCA watershed area. No associated hemorrhage or mass effect.  Underlying chronic white matter signal changes most likely due to small vessel disease. Possibly an embolic stroke.  Cardiology consulted for evaluation of atrial fibrillation. No focal deficits.  Stroke work up ordered. MRA head ordered and appears to wnl Carotid duplex Right Carotid: Velocities in the right ICA are consistent with a 1-39% stenosis. Left Carotid: Velocities in the left ICA are consistent with a 1-39%  stenosis.  Echocardiogram showed Left ventricular ejection fraction, by estimation, is 60 to 65%. The left ventricle has normal function. The left ventricle has no regional wall motion abnormalities. There is mild left ventricular hypertrophy. Left ventricular diastolic parameters are consistent with Grade II diastolic dysfunction (pseudonormalization). Elevated left atrial pressure. A1c is 5.9% lipid panel shows LDL of 72, started her on Lipitor 20 mg  daily.    Discussed with cardiology and started her on eliquis 2.5 mg BID.  PT evaluation recommending snf with 24 hours supervision.  SLP eval recommending dysphagia 3 diet  Neurology consulted and recommendations given. No new complaints at this time.   Acute encephalopathy probably secondary to urinary tract infection and medications and stroke.  TSH within normal limits Vitamin B12 and ammonia are within normal limits Patient was started on IV Rocephin for urinary tract infection. Urine cultures grew 60,000 E. Coli sensitive to keflex. Transitioned to keflex to complete the course.  Completed the course of Keflex.  Hypotension: Probably secondary to decreased oral intake versus dehydration from infection .  Resolved.   Tachybradycardia syndrome/atrial tachycardia/ atrial flutter Asymptomatic. Cardiology consulted, started her on amiodarone 200 mg , consideration 200 mg of amiodarone daily today. Started her on low dose eliquis 2.5 mg BID.    GERD Stable.    Mild AKI Probably secondary to dehydration Patient came in with a creatinine of 1.1 improved to 0.7 with IV fluids.  No new labs today.  Nutrition:  On soft diet.  Dietary consulted.   Pt reported that she is ready to die. She denies any suicidal or homicidal ideations.  Palliative care on board, and recommended outpatient palliative follow up .  Psychiatry consulted, added zoloft for mood improvement.    Right foot pain.  Pain control with tramadol and x rays of the foot ordered.  X-rays of the foot showed mild osteoarthritis of the first metatarsophalangeal joint .   Discharge Instructions  Discharge Instructions    Ambulatory referral to Neurology   Complete by: As directed    Follow up with Dr. Leonie Man at [facility] in 4-6 weeks.  Thanks.     Allergies as of 11/11/2019      Reactions   Azopt [brinzolamide] Other (See Comments)   UNK   Brimonidine Other (See Comments)   UNK   Lumigan [bimatoprost]  Other (See Comments)   UNK      Medication List    STOP taking these medications   metoprolol tartrate 25 MG tablet Commonly known as: LOPRESSOR     TAKE these medications   acetaminophen 325 MG tablet Commonly known as: TYLENOL Take 650 mg by mouth every 4 (four) hours as needed for mild pain or headache.   amiodarone 100 MG tablet Commonly known as: PACERONE Take 1 tablet (100 mg total) by mouth daily.   apixaban 2.5 MG Tabs tablet Commonly known as: ELIQUIS Take 1 tablet (2.5 mg total) by mouth 2 (two) times daily.   atorvastatin 20 MG tablet Commonly known as: LIPITOR Take 1 tablet (20 mg total) by mouth daily at 6 PM.   b complex vitamins tablet Take 1 tablet by mouth daily.   bismuth subsalicylate 606 TK/16WF suspension Commonly known as: PEPTO BISMOL Take 60 mLs by mouth daily as needed for indigestion or diarrhea or loose stools.   Calcium Carbonate-Vitamin D 600-400 MG-UNIT tablet Take 1 tablet by mouth daily.   CENTRUM SILVER PO Take 1 tablet by mouth daily. 7am-10am.   diphenoxylate-atropine 2.5-0.025 MG/5ML liquid Commonly known as: LOMOTIL Take by mouth 3 (three) times daily as needed for diarrhea or loose stools.   melatonin 3 MG Tabs tablet Take 3 mg by mouth at bedtime.   memantine 5 MG tablet Commonly known as: NAMENDA Take 5 mg by mouth daily.   NON FORMULARY Moisturizing Cream to extremities with morning and evening care Every Shift   NON FORMULARY  Regular Special Instructions: Regular Diet, Thin Liquids   omeprazole 20 MG capsule Commonly known as: PRILOSEC Take 20 mg by mouth daily.   potassium chloride SA 20 MEQ tablet Commonly known as: KLOR-CON Take 20 mEq by mouth daily.   sertraline 25 MG tablet Commonly known as: ZOLOFT Take 0.5 tablets (12.5 mg total) by mouth at bedtime.   timolol 0.5 % ophthalmic solution Commonly known as: BETIMOL Place 1 drop into both eyes daily. 7am-10am.   torsemide 20 MG tablet Commonly  known as: DEMADEX Take 20 mg by mouth daily.       Follow-up Information    Garvin Fila, MD. Schedule an appointment as soon as possible for a visit in 4 week(s).   Specialties: Neurology, Radiology Contact information: [address]  [place] [address] [phone]              Allergies  Allergen Reactions  . Azopt [Brinzolamide] Other (See Comments)    UNK  . Brimonidine Other (See Comments)    UNK  . Lumigan [Bimatoprost] Other (See Comments)    UNK    Consultations:  Cardiology  Neurology  Psychiatry.    Procedures/Studies: DG Chest 2 View  Result Date: 11/02/2019 CLINICAL DATA:  Altered mental status EXAM: CHEST - 2 VIEW COMPARISON:  05/30/2019 FINDINGS: The heart size and mediastinal contours are stable. Mildly hyperexpanded lungs without focal airspace consolidation, pleural effusion, or pneumothorax. The visualized skeletal structures are unremarkable. IMPRESSION: No active cardiopulmonary disease. Electronically Signed   By: Davina Poke D.O.   On: 11/02/2019 11:42   CT Head Wo Contrast  Result Date: 11/02/2019 CLINICAL DATA:  Mental status change EXAM: CT HEAD WITHOUT CONTRAST TECHNIQUE: Contiguous axial images were obtained from the base of the skull through the vertex without intravenous contrast. COMPARISON:  CT head 08/07/2005 FINDINGS: Brain: Moderate atrophy with progression since the prior study. Atrophy most prominent in the frontal lobes. There is ventricular enlargement due to atrophy which has progressed. Periventricular white matter hypodensity bilaterally also has progressed. Negative for acute infarct, hemorrhage, mass. Vascular: Negative for hyperdense vessel Skull: Negative Sinuses/Orbits: Paranasal sinuses clear. Bilateral cataract extraction. Other: None IMPRESSION: No acute abnormality Progression of atrophy and chronic microvascular ischemic change in the white matter compared with 2007. Electronically Signed   By:  Franchot Gallo M.D.   On: 11/02/2019 11:32   MR ANGIO HEAD WO CONTRAST  Result Date: 11/05/2019 CLINICAL DATA:  Initial evaluation for acute stroke. EXAM: MRA HEAD WITHOUT CONTRAST TECHNIQUE: Angiographic images of the Circle of Willis were obtained using MRA technique without intravenous contrast. COMPARISON:  Prior brain MRI from 11/04/2019. FINDINGS: ANTERIOR CIRCULATION: Visualized distal cervical segments of the internal carotid arteries are widely patent with symmetric antegrade flow. Petrous, cavernous, and supraclinoid ICAs widely patent without stenosis or other abnormality. Origin of the ophthalmic arteries patent. ICA termini well perfused. A1 segments patent bilaterally. Normal anterior communicating artery complex. Anterior cerebral arteries widely patent to their distal aspects without stenosis. No M1 stenosis or occlusion. Normal MCA bifurcations. Distal MCA branches well perfused and symmetric. POSTERIOR CIRCULATION: Vertebral arteries widely patent to the vertebrobasilar junction without stenosis. Left vertebral artery dominant. Partially visualized posterior inferior cerebral arteries patent bilaterally. Basilar widely patent to its distal aspect without stenosis. Superior cerebral arteries patent bilaterally. Both PCAs primarily supplied via the basilar well perfused to their distal aspects. No intracranial aneurysm or other vascular abnormality. IMPRESSION: Normal intracranial MRA. No large vessel occlusion, hemodynamically significant stenosis, or other  vascular abnormality. No aneurysm. Electronically Signed   By: Jeannine Boga M.D.   On: 11/05/2019 19:49   MR BRAIN WO CONTRAST  Result Date: 11/04/2019 CLINICAL DATA:  86 year old female with altered mental status for 2 days. EXAM: MRI HEAD WITHOUT CONTRAST TECHNIQUE: Multiplanar, multiecho pulse sequences of the brain and surrounding structures were obtained without intravenous contrast. COMPARISON:  Head CT 11/02/2019. FINDINGS:  Brain: Patchy, scattered restricted diffusion in the posterior left hemisphere seen in the inferior left parietal lobe cortex (series 5, image 81), left periatrial white matter approaching the splenium of the corpus callosum (image 77), and occipital lobe white matter (image 70). Associated mild T2 and FLAIR hyperintensity in these areas with no evidence of hemorrhage. No mass effect. No other restricted diffusion. Patchy bilateral periventricular white matter T2 and FLAIR hyperintensity with some areas most resembling chronic white matter lacunar infarcts. But no chronic cortical encephalomalacia identified. No chronic cerebral blood products. No midline shift, mass effect, evidence of mass lesion, ventriculomegaly, extra-axial collection or acute intracranial hemorrhage. Cervicomedullary junction and pituitary are within normal limits. Vascular: Major intracranial vascular flow voids are preserved. Skull and upper cervical spine: Partially visible cervical spine degeneration including ligamentous hypertrophy about the odontoid. Visualized bone marrow signal is within normal limits. Sinuses/Orbits: Postoperative changes to both globes. No acute findings. Other: Mild left mastoid effusion.  Visible pharynx appears normal. IMPRESSION: 1. 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 No associated hemorrhage or mass effect. 2. Underlying chronic white matter signal changes most likely due to small vessel disease. 3. Mild left mastoid effusion, likely postinflammatory. Electronically Signed   By: Genevie Ann M.D.   On: 11/04/2019 20:53   DG Foot 2 Views Right  Result Date: 11/08/2019 CLINICAL DATA:  Plantar foot pain cross the ball of the foot at the first through third metatarsal-phalangeal joint region. EXAM: RIGHT FOOT - 2 VIEW COMPARISON:  None. FINDINGS: Mild pes cavus and hammertoe deformity of the third and fourth phalanges. Trace  osteoarthritis of the first metatarsal phalangeal joint. No erosion, periosteal reaction, or bony destruction. No plantar calcaneal spur. No focal soft tissue abnormality. IMPRESSION: 1. Mild osteoarthritis of the first metatarsophalangeal joint. 2. Pes cavus and hammertoe deformity of the third and fourth phalanges. Electronically Signed   By: Keith Rake M.D.   On: 11/08/2019 15:09   ECHOCARDIOGRAM COMPLETE  Result Date: 11/05/2019    ECHOCARDIOGRAM REPORT   Patient Name:   VENNELA JUTTE Date of Exam: 11/05/2019 Medical Rec #:  [id number]        Height:       60.0 in Accession #:    [id number]       Weight:       80.2 lb Date of Birth:  March 26, 1923         BSA:          1.266 m Patient Age:    89 years         BP:           117/54 mmHg Patient Gender: F                HR:           52 bpm. Exam Location:  Inpatient Procedure: 2D Echo, Cardiac Doppler and Color Doppler Indications:    COVID-19  History:        Patient has no prior history of Echocardiogram examinations.  Sonographer:  Tiffany Dance Referring Phys: [id number] VIJAYA AKULA IMPRESSIONS  1. Left ventricular ejection fraction, by estimation, is 60 to 65%. The left ventricle has normal function. The left ventricle has no regional wall motion abnormalities. There is mild left ventricular hypertrophy. Left ventricular diastolic parameters are consistent with Grade II diastolic dysfunction (pseudonormalization). Elevated left atrial pressure.  2. Right ventricular systolic function is normal. The right ventricular size is normal. There is mildly elevated pulmonary artery systolic pressure. The estimated right ventricular systolic pressure is 31.5 mmHg.  3. Right atrial size was mildly dilated.  4. The mitral valve is normal in structure. Trivial mitral valve regurgitation.  5. Tricuspid valve regurgitation is moderate to severe.  6. The aortic valve is tricuspid. Aortic valve regurgitation is mild. Mild aortic valve sclerosis is present, with no  evidence of aortic valve stenosis.  7. The inferior vena cava is normal in size with greater than 50% respiratory variability, suggesting right atrial pressure of 3 mmHg. FINDINGS  Left Ventricle: Left ventricular ejection fraction, by estimation, is 60 to 65%. The left ventricle has normal function. The left ventricle has no regional wall motion abnormalities. The left ventricular internal cavity size was small. There is mild left ventricular hypertrophy. Left ventricular diastolic parameters are consistent with Grade II diastolic dysfunction (pseudonormalization). Elevated left atrial pressure. Right Ventricle: The right ventricular size is normal. Right vetricular wall thickness was not assessed. Right ventricular systolic function is normal. There is mildly elevated pulmonary artery systolic pressure. The tricuspid regurgitant velocity is 2.97 m/s, and with an assumed right atrial pressure of 3 mmHg, the estimated right ventricular systolic pressure is 40.0 mmHg. Left Atrium: Left atrial size was normal in size. Right Atrium: Right atrial size was mildly dilated. Pericardium: Trivial pericardial effusion is present. Mitral Valve: The mitral valve is normal in structure. Trivial mitral valve regurgitation. Tricuspid Valve: The tricuspid valve is normal in structure. Tricuspid valve regurgitation is moderate to severe. Aortic Valve: The aortic valve is tricuspid. Aortic valve regurgitation is mild. Aortic regurgitation PHT measures 558 msec. Mild aortic valve sclerosis is present, with no evidence of aortic valve stenosis. Pulmonic Valve: The pulmonic valve was grossly normal. Pulmonic valve regurgitation is trivial. Aorta: The aortic root and ascending aorta are structurally normal, with no evidence of dilitation. Venous: The inferior vena cava is normal in size with greater than 50% respiratory variability, suggesting right atrial pressure of 3 mmHg. IAS/Shunts: The interatrial septum was not well visualized.   LEFT VENTRICLE PLAX 2D LVIDd:         3.21 cm LVIDs:         2.01 cm LV PW:         1.15 cm LV IVS:        0.90 cm LVOT diam:     1.70 cm LV SV:         47 LV SV Index:   37 LVOT Area:     2.27 cm  RIGHT VENTRICLE          IVC RV Basal diam:  2.88 cm  IVC diam: 1.49 cm RV Mid diam:    1.94 cm TAPSE (M-mode): 1.8 cm LEFT ATRIUM             Index       RIGHT ATRIUM           Index LA diam:        3.20 cm 2.53 cm/m  RA Area:     13.90 cm LA Vol (  A2C):   44.4 ml 35.07 ml/m RA Volume:   34.40 ml  27.17 ml/m LA Vol (A4C):   35.0 ml 27.64 ml/m LA Biplane Vol: 40.9 ml 32.30 ml/m  AORTIC VALVE LVOT Vmax:   94.80 cm/s LVOT Vmean:  63.000 cm/s LVOT VTI:    0.205 m AI PHT:      558 msec  AORTA Ao Root diam: 2.80 cm Ao Asc diam:  2.70 cm MITRAL VALVE                TRICUSPID VALVE MV Area (PHT): 3.21 cm     TR Peak grad:   35.3 mmHg MV Decel Time: 236 msec     TR Vmax:        297.00 cm/s MV E velocity: 102.00 cm/s MV A velocity: 68.30 cm/s   SHUNTS MV E/A ratio:  1.49         Systemic VTI:  0.20 m                             Systemic Diam: 1.70 cm Oswaldo Milian MD Electronically signed by Oswaldo Milian MD Signature Date/Time: 11/05/2019/5:59:08 PM    Final    VAS US CAROTID  Result Date: 11/09/2019 Carotid Arterial Duplex Study Indications:       CVA. Comparison Study:  no prior Performing Technologist: Abram Sander RVS  Examination Guidelines: A complete evaluation includes B-mode imaging, spectral Doppler, color Doppler, and power Doppler as needed of all accessible portions of each vessel. Bilateral testing is considered an integral part of a complete examination. Limited examinations for reoccurring indications may be performed as noted.  Right Carotid Findings: +----------+--------+--------+--------+------------------+--------+           PSV cm/sEDV cm/sStenosisPlaque DescriptionComments +----------+--------+--------+--------+------------------+--------+ CCA Prox  59      12               heterogenous               +----------+--------+--------+--------+------------------+--------+ CCA Distal52      7               heterogenous               +----------+--------+--------+--------+------------------+--------+ ICA Prox  34      10      1-39%   heterogenous               +----------+--------+--------+--------+------------------+--------+ ICA Distal47      11                                         +----------+--------+--------+--------+------------------+--------+ ECA       55                                                 +----------+--------+--------+--------+------------------+--------+ +----------+--------+-------+--------+-------------------+           PSV cm/sEDV cmsDescribeArm Pressure (mmHG) +----------+--------+-------+--------+-------------------+ [id number]                                         +----------+--------+-------+--------+-------------------+ +---------+--------+--+--------+--+---------+ VertebralPSV cm/s52EDV cm/s14Antegrade +---------+--------+--+--------+--+---------+  Left Carotid Findings: +----------+--------+--------+--------+------------------+--------+  PSV cm/sEDV cm/sStenosisPlaque DescriptionComments +----------+--------+--------+--------+------------------+--------+ CCA Prox  57      14              heterogenous               +----------+--------+--------+--------+------------------+--------+ CCA Distal63      12              heterogenous               +----------+--------+--------+--------+------------------+--------+ ICA Prox  59      13      1-39%   heterogenous      tortuous +----------+--------+--------+--------+------------------+--------+ ICA Distal86      14                                         +----------+--------+--------+--------+------------------+--------+ ECA       77                                                  +----------+--------+--------+--------+------------------+--------+ +----------+--------+--------+--------+-------------------+           PSV cm/sEDV cm/sDescribeArm Pressure (mmHG) +----------+--------+--------+--------+-------------------+ [id number]                                          +----------+--------+--------+--------+-------------------+ +---------+--------+--+--------+-+---------+ VertebralPSV cm/s86EDV cm/s9Antegrade +---------+--------+--+--------+-+---------+   Summary: Right Carotid: Velocities in the right ICA are consistent with a 1-39% stenosis. Left Carotid: Velocities in the left ICA are consistent with a 1-39% stenosis. Vertebrals: Bilateral vertebral arteries demonstrate antegrade flow. *See table(s) above for measurements and observations.  Electronically signed by Antony Contras MD on 11/09/2019 at 1:20:26 PM.    Final       Subjective:  No new complaints.  Discharge Exam: Vitals:   11/11/19 0635 11/11/19 0800  BP: 126/79 126/63  Pulse: (!) 55 (!) 59  Resp: 20 17  Temp: 97.8 F (36.6 C) 98.2 F (36.8 C)  SpO2: 97% 97%   Vitals:   11/10/19 1421 11/10/19 2027 11/11/19 0635 11/11/19 0800  BP: 126/64 (!) 151/63 126/79 126/63  Pulse: 61 (!) 59 (!) 55 (!) 59  Resp: 17 16 20 17   Temp: (!) 97.4 F (36.3 C) (!) 97.5 F (36.4 C) 97.8 F (36.6 C) 98.2 F (36.8 C)  TempSrc: Oral Oral Oral Oral  SpO2: 97% 96% 97% 97%  Weight:      Height:        General: Pt is alert, awake, not in acute distress Cardiovascular: RRR, S1/S2 +, no rubs, no gallops Respiratory: CTA bilaterally, no wheezing, no rhonchi Abdominal: Soft, NT, ND, bowel sounds + Extremities: no edema, no cyanosis    The results of significant diagnostics from this hospitalization (including imaging, microbiology, ancillary and laboratory) are listed below for reference.     Microbiology: Recent Results (from the past 240 hour(s))  Urine culture     Status: Abnormal   Collection  Time: 11/02/19 10:47 AM   Specimen: Urine, Random  Result Value Ref Range Status   Specimen Description   Final    URINE, RANDOM Performed at [facility], [address] [place], [place] [address]    Special Requests   Final  NONE Performed at [facility], [place] [address], [place], [place] [address]    Culture 60,000 COLONIES/mL ESCHERICHIA COLI (A)  Final   Report Status 11/05/2019 FINAL  Final   Organism ID, Bacteria ESCHERICHIA COLI (A)  Final      Susceptibility   Escherichia coli - MIC*    AMPICILLIN >=32 RESISTANT Resistant     CEFAZOLIN 16 SENSITIVE Sensitive     CEFTRIAXONE <=0.25 SENSITIVE Sensitive     CIPROFLOXACIN <=0.25 SENSITIVE Sensitive     GENTAMICIN <=1 SENSITIVE Sensitive     IMIPENEM <=0.25 SENSITIVE Sensitive     NITROFURANTOIN 32 SENSITIVE Sensitive     TRIMETH/SULFA >=320 RESISTANT Resistant     AMPICILLIN/SULBACTAM >=32 RESISTANT Resistant     PIP/TAZO <=4 SENSITIVE Sensitive     * 60,000 COLONIES/mL ESCHERICHIA COLI  Blood culture (routine x 2)     Status: None   Collection Time: 11/02/19  2:38 PM   Specimen: BLOOD  Result Value Ref Range Status   Specimen Description   Final    BLOOD RIGHT ANTECUBITAL Performed at [facility], [place] [address] [place], [place] [address]    Special Requests   Final    BOTTLES DRAWN AEROBIC ONLY Blood Culture adequate volume Performed at [place] [facility] [address] [place], [place] [address]    Culture   Final    NO GROWTH 5 DAYS Performed at [facility], [place] [address], [place],  [address]    Report Status 11/07/2019 FINAL  Final  SARS Coronavirus 2 by RT PCR (hospital order, performed in [facility] hospital lab) Nasopharyngeal Nasopharyngeal Swab     Status: None   Collection Time: 11/02/19  2:40 PM   Specimen: Nasopharyngeal Swab  Result Value Ref Range Status   SARS Coronavirus 2 NEGATIVE NEGATIVE Final    Comment:   MRSA PCR Screening      Status: None   Collection Time: 11/04/19  2:02 PM   Specimen: Nasal Mucosa; Nasopharyngeal  Result      MRSA by PCR       Comment:   SARS Coronavirus 2 by RT PCR (hospital order, performed in [place] hospital lab) Nasopharyngeal Nasopharyngeal Swab     Status: None   Collection Time: 11/09/19 12:15 PM   Specimen: Nasopharyngeal Swab  Result      SARS Coronavirus 2       Comment:      Labs: BNP (last 3 results) No results for input(s): BNP in the last 8760 hours. Basic Metabolic Panel: Recent Labs  Lab 11/06/19 0544  NA 138  K 3.9  CL 107  CO2 23  GLUCOSE 93  BUN 9  CREATININE 0.72  CALCIUM 8.1*   Liver Function Tests: Recent Labs  Lab 11/06/19 0544  AST 34  ALT 18  ALKPHOS 45  BILITOT 0.3  PROT 5.1*  ALBUMIN 2.9*   No results for input(s): LIPASE, AMYLASE in the last 168 hours. No results for input(s): AMMONIA in the last 168 hours. CBC: Recent Labs  Lab 11/06/19 0544  WBC 4.3  HGB 12.8  HCT 41.1  MCV 97.4  PLT 206    Anemia work up No results for input(s): VITAMINB12, FOLATE, FERRITIN, TIBC, IRON, RETICCTPCT in the last 72 hours. Urinalysis    Component Value Date/Time   COLORURINE YELLOW 11/02/2019 1045   APPEARANCEUR CLEAR 11/02/2019 1045   LABSPEC 1.008 11/02/2019 1045   PHURINE 5.0 11/02/2019 1045  GLUCOSEU NEGATIVE 11/02/2019 [place] 11/02/2019 [place] 11/02/2019 [place] 11/02/2019 [place] 11/02/2019 1045   UROBILINOGEN 0.2 09/18/2009 1810   NITRITE NEGATIVE 11/02/2019 1045   LEUKOCYTESUR LARGE (A) 11/02/2019 1045   Sepsis Labs Invalid input(s): PROCALCITONIN,  WBC,  LACTICIDVEN Microbiology Recent Results (from the past 240 hour(s))  Urine culture     Status: Abnormal   Collection Time: 11/02/19 10:47 AM   Specimen: Urine, Random  Result Value Ref Range Status   Specimen Description   Final    URINE, RANDOM Performed at [facility], [place]  [address], [place], [place] [address]    Special Requests   Final    NONE Performed at [facility], [place] [address], [place], [place] [address]    Culture 60,000 COLONIES/mL ESCHERICHIA COLI (A)  Final   Report Status 11/05/2019 FINAL  Final   Organism ID, Bacteria ESCHERICHIA COLI (A)  Final      Susceptibility   Escherichia coli - MIC*    AMPICILLIN >=32 RESISTANT Resistant     CEFAZOLIN 16 SENSITIVE Sensitive     CEFTRIAXONE <=0.25 SENSITIVE Sensitive     CIPROFLOXACIN <=0.25 SENSITIVE Sensitive     GENTAMICIN <=1 SENSITIVE Sensitive     IMIPENEM <=0.25 SENSITIVE Sensitive     NITROFURANTOIN 32 SENSITIVE Sensitive     TRIMETH/SULFA >=320 RESISTANT Resistant     AMPICILLIN/SULBACTAM >=32 RESISTANT Resistant     PIP/TAZO <=4 SENSITIVE Sensitive     * 60,000 COLONIES/mL ESCHERICHIA COLI  Blood culture (routine x 2)     Status: None   Collection Time: 11/02/19  2:38 PM   Specimen: BLOOD  Result Value Ref Range Status   Specimen Description   Final    BLOOD RIGHT ANTECUBITAL Performed at [facility], [place] [address], [place], [place] [address]    Special Requests   Final    BOTTLES DRAWN AEROBIC ONLY Blood Culture adequate volume Performed at [place] [address] [place], [place] [address]    Culture   Final    NO GROWTH 5 DAYS Performed at [facility], [place] [address], [place], [place] [address]    Report Status 11/07/2019 FINAL  Final  SARS Coronavirus 2 by RT PCR (hospital order, performed in [facility] hospital lab) Nasopharyngeal Nasopharyngeal Swab     Status: None   Collection Time: 11/02/19  2:40 PM   Specimen: Nasopharyngeal Swab  Result Value Ref Range Status   SARS Coronavirus 2 NEGATIVE NEGATIVE Final    Comment: (NOTE) SARS-CoV-2 target nucleic acids are NOT DETECTED.  The SARS-CoV-2 RNA is generally detectable in upper and loweormation.  Fact Sheet for Patients:    StrictlyIdeas.no  Fact Sheet for Healthcare Providers: https:/Performed at [facility], [place] [address], [place], [place] [address]   MRSA PCR Screening     Status: None   Collection Time: 11/04/19  2:02 PM   Specimen: Nasal Mucosa; Nasopharyngeal  Result Value Ref Range Status   MRSA by PCR NEGATIVE NEGATIVE Final    Comment:        The GeneXpert MRSA Assay (FDA approved for NASAL specimens only), is one component of a comprehensive MRSA colonization surveillance program. It is not intended to diagnose MRSA infection nor to guide or monitor treatment for MRSA infections. Performed at [facility], [place] [address], [place], [place] [address]  SARS Coronavirus 2 by RT PCR (hospital order, performed in [facility] hospital lab) Nasopharyngeal Nasopharyngeal Swab     Status: None   Collection Time: 11/09/19 12:15 PM   Specimen: Nasopharyngeal Swab  Result Value Ref Range Status   SARS Coronavirus 2 NEGATIVE NEGATIVE Final    Comment: (NOTE) SARS-CoV-2 target nucleic acids are NOT DETECTED.  The SARS-CoV-2 RNA is genern collection / handling, submission of specimen other than nasopharyngeal swab, presence of viral mutation(s) within the areas targeted by this assay, and inadequate number of viral copies (<250 copies / mL). A negative result must be combined with clinicalPerformed at [facility], [place] [address], [place], [place] [address]      Time coordinating discharge: 32 minutes.  SIGNED:   Deatra James, MD  [facility] 11/11/2019, 10:53 AM

## 2019-11-12 ENCOUNTER — Encounter: Payer: Self-pay | Admitting: Nurse Practitioner

## 2019-11-12 ENCOUNTER — Other Ambulatory Visit: Payer: Self-pay | Admitting: *Deleted

## 2019-11-12 DIAGNOSIS — B029 Zoster without complications: Secondary | ICD-10-CM | POA: Diagnosis not present

## 2019-11-12 DIAGNOSIS — R32 Unspecified urinary incontinence: Secondary | ICD-10-CM | POA: Diagnosis not present

## 2019-11-12 DIAGNOSIS — N3946 Mixed incontinence: Secondary | ICD-10-CM | POA: Diagnosis not present

## 2019-11-12 DIAGNOSIS — F29 Unspecified psychosis not due to a substance or known physiological condition: Secondary | ICD-10-CM | POA: Diagnosis not present

## 2019-11-12 DIAGNOSIS — I471 Supraventricular tachycardia: Secondary | ICD-10-CM | POA: Diagnosis not present

## 2019-11-12 DIAGNOSIS — U071 COVID-19: Secondary | ICD-10-CM | POA: Diagnosis not present

## 2019-11-12 DIAGNOSIS — H40009 Preglaucoma, unspecified, unspecified eye: Secondary | ICD-10-CM | POA: Diagnosis not present

## 2019-11-12 DIAGNOSIS — H353220 Exudative age-related macular degeneration, left eye, stage unspecified: Secondary | ICD-10-CM | POA: Diagnosis not present

## 2019-11-12 DIAGNOSIS — R1312 Dysphagia, oropharyngeal phase: Secondary | ICD-10-CM | POA: Diagnosis not present

## 2019-11-12 DIAGNOSIS — R41841 Cognitive communication deficit: Secondary | ICD-10-CM | POA: Diagnosis not present

## 2019-11-12 DIAGNOSIS — R404 Transient alteration of awareness: Secondary | ICD-10-CM | POA: Diagnosis not present

## 2019-11-12 DIAGNOSIS — I69318 Other symptoms and signs involving cognitive functions following cerebral infarction: Secondary | ICD-10-CM | POA: Diagnosis not present

## 2019-11-12 DIAGNOSIS — R1310 Dysphagia, unspecified: Secondary | ICD-10-CM | POA: Diagnosis not present

## 2019-11-12 DIAGNOSIS — J1282 Pneumonia due to coronavirus disease 2019: Secondary | ICD-10-CM | POA: Diagnosis not present

## 2019-11-12 DIAGNOSIS — H25013 Cortical age-related cataract, bilateral: Secondary | ICD-10-CM | POA: Diagnosis not present

## 2019-11-12 DIAGNOSIS — I639 Cerebral infarction, unspecified: Secondary | ICD-10-CM | POA: Diagnosis not present

## 2019-11-12 DIAGNOSIS — R29898 Other symptoms and signs involving the musculoskeletal system: Secondary | ICD-10-CM | POA: Diagnosis not present

## 2019-11-12 DIAGNOSIS — I63432 Cerebral infarction due to embolism of left posterior cerebral artery: Secondary | ICD-10-CM | POA: Diagnosis not present

## 2019-11-12 DIAGNOSIS — R55 Syncope and collapse: Secondary | ICD-10-CM | POA: Diagnosis not present

## 2019-11-12 DIAGNOSIS — R609 Edema, unspecified: Secondary | ICD-10-CM | POA: Diagnosis not present

## 2019-11-12 DIAGNOSIS — F4323 Adjustment disorder with mixed anxiety and depressed mood: Secondary | ICD-10-CM | POA: Diagnosis not present

## 2019-11-12 DIAGNOSIS — I959 Hypotension, unspecified: Secondary | ICD-10-CM | POA: Diagnosis not present

## 2019-11-12 DIAGNOSIS — M2550 Pain in unspecified joint: Secondary | ICD-10-CM | POA: Diagnosis not present

## 2019-11-12 DIAGNOSIS — K529 Noninfective gastroenteritis and colitis, unspecified: Secondary | ICD-10-CM | POA: Diagnosis not present

## 2019-11-12 DIAGNOSIS — W010XXD Fall on same level from slipping, tripping and stumbling without subsequent striking against object, subsequent encounter: Secondary | ICD-10-CM | POA: Diagnosis not present

## 2019-11-12 DIAGNOSIS — R350 Frequency of micturition: Secondary | ICD-10-CM | POA: Diagnosis not present

## 2019-11-12 DIAGNOSIS — R4182 Altered mental status, unspecified: Secondary | ICD-10-CM | POA: Diagnosis not present

## 2019-11-12 DIAGNOSIS — R001 Bradycardia, unspecified: Secondary | ICD-10-CM | POA: Diagnosis not present

## 2019-11-12 DIAGNOSIS — N951 Menopausal and female climacteric states: Secondary | ICD-10-CM | POA: Diagnosis not present

## 2019-11-12 DIAGNOSIS — R2681 Unsteadiness on feet: Secondary | ICD-10-CM | POA: Diagnosis not present

## 2019-11-12 DIAGNOSIS — N1831 Chronic kidney disease, stage 3a: Secondary | ICD-10-CM | POA: Diagnosis not present

## 2019-11-12 DIAGNOSIS — R000 Tachycardia, unspecified: Secondary | ICD-10-CM | POA: Diagnosis not present

## 2019-11-12 DIAGNOSIS — R410 Disorientation, unspecified: Secondary | ICD-10-CM | POA: Diagnosis not present

## 2019-11-12 DIAGNOSIS — R197 Diarrhea, unspecified: Secondary | ICD-10-CM | POA: Diagnosis not present

## 2019-11-12 DIAGNOSIS — M6281 Muscle weakness (generalized): Secondary | ICD-10-CM | POA: Diagnosis not present

## 2019-11-12 DIAGNOSIS — K219 Gastro-esophageal reflux disease without esophagitis: Secondary | ICD-10-CM | POA: Diagnosis not present

## 2019-11-12 DIAGNOSIS — N183 Chronic kidney disease, stage 3 unspecified: Secondary | ICD-10-CM | POA: Diagnosis not present

## 2019-11-12 DIAGNOSIS — F339 Major depressive disorder, recurrent, unspecified: Secondary | ICD-10-CM | POA: Diagnosis not present

## 2019-11-12 DIAGNOSIS — Z7401 Bed confinement status: Secondary | ICD-10-CM | POA: Diagnosis not present

## 2019-11-12 DIAGNOSIS — N390 Urinary tract infection, site not specified: Secondary | ICD-10-CM | POA: Diagnosis not present

## 2019-11-12 DIAGNOSIS — R29818 Other symptoms and signs involving the nervous system: Secondary | ICD-10-CM | POA: Diagnosis not present

## 2019-11-12 MED ORDER — METOPROLOL SUCCINATE ER 25 MG PO TB24
25.0000 mg | ORAL_TABLET | Freq: Every day | ORAL | Status: DC
  Administered 2019-11-12: 25 mg via ORAL
  Filled 2019-11-12: qty 1

## 2019-11-12 MED ORDER — METOPROLOL SUCCINATE ER 25 MG PO TB24: 25.0000 mg | ORAL_TABLET | Freq: Every day | ORAL | 0 refills | Status: DC

## 2019-11-12 MED ORDER — DIGOXIN 0.25 MG/ML IJ SOLN
0.1250 mg | Freq: Every day | INTRAMUSCULAR | Status: AC
  Administered 2019-11-12: 0.125 mg via INTRAVENOUS
  Filled 2019-11-12: qty 2

## 2019-11-12 MED ORDER — DIGOXIN 0.25 MG/ML IJ SOLN: 0.1250 mg | Freq: Every day | INTRAMUSCULAR | Status: DC

## 2019-11-12 NOTE — Progress Notes (Signed)
Patient being dc in stable condition. Attempted to call report to [facility] nurse room 44 x3. Attempted to call report x3, left name and number in voicemail.

## 2019-11-12 NOTE — Progress Notes (Signed)
Pt heart rate reaching 140s Made provider aware. Will continue to monitor

## 2019-11-12 NOTE — Progress Notes (Signed)
Location:   [place] Room Number: 44 Place of Service:  SNF (31) Provider:  Marlana Latus NP   [name], [name], NP  Patient Care Team: [name], [name], NP as PCP - General (Internal Medicine)  Extended Emergency Contact Information Primary Emergency Contact: [name] Mobile Phone: [phone] Relation: Friend Secondary Emergency Contact: [name] Mobile Phone: [phone] Relation: Nephew  Code Status:  DNR Goals of care: Advanced Directive information Advanced Directives 11/02/2019  Does Patient Have a Medical Advance Directive? Yes  Type of Advance Directive Living will;Out of facility DNR (pink MOST or yellow form)  Does patient want to make changes to medical advance directive? No - Patient declined  Copy of [place] in Chart? No - copy requested  Pre-existing out of facility DNR order (yellow form or pink MOST form) -     Chief Complaint  Patient presents with   Acute Visit    medication review    HPI:  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 No pertinent surgical history.  Allergies  Allergen Reactions   Azopt [Brinzolamide] Other (See Comments)    UNK   Brimonidine Other (See Comments)    UNK   Lumigan [Bimatoprost] Other (See Comments)    UNK    Allergies as of 11/12/2019      Reactions   Azopt [brinzolamide] Other (See Comments)   UNK   Brimonidine Other (See Comments)   UNK   Lumigan [bimatoprost] Other (See Comments)   UNK      Medication List    Notice   This visit is during an admission. Changes to the med list made in this visit will be reflected in the After Visit Summary of the admission.     Review of Systems  Immunization History  Administered Date(s) Administered   Influenza,  High Dose Seasonal PF 11/08/2018   Influenza-Unspecified 11/07/2013   Moderna SARS-COVID-2 Vaccination 06/06/2019   Pertinent  Health Maintenance Due  Topic Date Due   PNA vac Low Risk Adult (1 of 2 - PCV13) Never done   INFLUENZA VACCINE  11/08/2019   DEXA SCAN  Completed   Fall Risk  05/19/2019 05/14/2019  Falls in the past year? 0 0  Number falls in past yr: 0 0  Injury with Fall? 0 -   Functional Status Survey:    Vitals:   11/12/19 1007  BP: 110/68  Pulse: (!) 58  Resp: 20  Temp: 97.7 F (36.5 C)  SpO2: 97%  Weight: 84 lb 9.6 oz (38.4 kg)  Height: 5\' 5"  (1.651 m)   Body mass index is 14.08 kg/m. Physical Exam  Labs reviewed: Recent Labs    11/03/19 0454 11/04/19 0535 11/06/19 0544  NA 143 141 138  K 4.1 3.6 3.9  CL 103 107 107  CO2 22 24 23   GLUCOSE 76 92 93  BUN 32* 25* 9  CREATININE 1.02* 0.86 0.72  CALCIUM 8.5* 8.2* 8.1*  MG 2.3  --   --    Recent Labs    11/02/19 1045 11/03/19 0454 11/06/19 0544  AST 34 32 34  ALT 19 18 18   ALKPHOS 64 57 45  BILITOT 0.8 0.9 0.3  PROT 6.9 6.1* 5.1*  ALBUMIN 4.0 3.5 2.9*  Recent Labs    04/26/19 0414 06/16/19 0000 06/16/19 0000 07/24/19 0000 07/24/19 0000 10/26/19 0000 11/02/19 1045 11/03/19 0454 11/06/19 0544  WBC   < > 6.1   < > 9.5   < > 7.8 8.6 6.8 4.3  NEUTROABS  --  3,892  --  7,334  --  5,936  --   --   --   HGB   < > 12.7   < > 12.8   < > 13.9 15.6* 14.9 12.8  HCT   < > 39   < > 39   < > 42 48.9* 47.4* 41.1  MCV   < >  --   --   --   --   --  94.6 96.0 97.4  PLT   < > 257   < > 232   < > 251 250 230 206   < > = values in this interval not displayed.   Lab Results  Component Value Date   TSH 1.629 11/02/2019   Lab Results  Component Value Date   HGBA1C 5.9 (H) 11/04/2019   Lab Results  Component Value Date   CHOL 138 11/06/2019   HDL 49 11/06/2019   LDLCALC 72 11/06/2019   TRIG 86 11/06/2019   CHOLHDL 2.8 11/06/2019    Significant Diagnostic Results in last 30 days:  DG  Chest 2 View  Result Date: 11/02/2019 CLINICAL DATA:  Altered mental status EXAM: CHEST - 2 VIEW COMPARISON:  05/30/2019 FINDINGS: The heart size and mediastinal contours are stable. Mildly hyperexpanded lungs without focal airspace consolidation, pleural effusion, or pneumothorax. The visualized skeletal structures are unremarkable. IMPRESSION: No active cardiopulmonary disease. Electronically Signed   By: Davina Poke D.O.   On: 11/02/2019 11:42   CT Head Wo Contrast  Result Date: 11/02/2019 CLINICAL DATA:  Mental status change EXAM: CT HEAD WITHOUT CONTRAST TECHNIQUE: Contiguous axial images were obtained from the base of the skull through the vertex without intravenous contrast. COMPARISON:  CT head 08/07/2005 FINDINGS: Brain: Moderate atrophy with progression since the prior study. Atrophy most prominent in the frontal lobes. There is ventricular enlargement due to atrophy which has progressed. Periventricular white matter hypodensity bilaterally also has progressed. Negative for acute infarct, hemorrhage, mass. Vascular: Negative for hyperdense vessel Skull: Negative Sinuses/Orbits: Paranasal sinuses clear. Bilateral cataract extraction. Other: None IMPRESSION: No acute abnormality Progression of atrophy and chronic microvascular ischemic change in the white matter compared with 2007. Electronically Signed   By: Franchot Gallo M.D.   On: 11/02/2019 11:32   MR ANGIO HEAD WO CONTRAST  Result Date: 11/05/2019 CLINICAL DATA:  Initial evaluation for acute stroke. EXAM: MRA HEAD WITHOUT CONTRAST TECHNIQUE: Angiographic images of the Circle of Willis were obtained using MRA technique without intravenous contrast. COMPARISON:  Prior brain MRI from 11/04/2019. FINDINGS: ANTERIOR CIRCULATION: Visualized distal cervical segments of the internal carotid arteries are widely patent with symmetric antegrade flow. Petrous, cavernous, and supraclinoid ICAs widely patent without stenosis or other abnormality.  Origin of the ophthalmic arteries patent. ICA termini well perfused. A1 segments patent bilaterally. Normal anterior communicating artery complex. Anterior cerebral arteries widely patent to their distal aspects without stenosis. No M1 stenosis or occlusion. Normal MCA bifurcations. Distal MCA branches well perfused and symmetric. POSTERIOR CIRCULATION: Vertebral arteries widely patent to the vertebrobasilar junction without stenosis. Left vertebral artery dominant. Partially visualized posterior inferior cerebral arteries patent bilaterally. Basilar widely patent to its distal aspect without stenosis. Superior cerebral arteries patent bilaterally. Both PCAs primarily supplied  via the basilar well perfused to their distal aspects. No intracranial aneurysm or other vascular abnormality. IMPRESSION: Normal intracranial MRA. No large vessel occlusion, hemodynamically significant stenosis, or other vascular abnormality. No aneurysm. Electronically Signed   By: Jeannine Boga M.D.   On: 11/05/2019 19:49   MR BRAIN WO CONTRAST  Result Date: 11/04/2019 CLINICAL DATA:  86 year old female with altered mental status for 2 days. EXAM: MRI HEAD WITHOUT CONTRAST TECHNIQUE: Multiplanar, multiecho pulse sequences of the brain and surrounding structures were obtained without intravenous contrast. COMPARISON:  Head CT 11/02/2019. FINDINGS: Brain: Patchy, scattered restricted diffusion in the posterior left hemisphere seen in the inferior left parietal lobe cortex (series 5, image 81), left periatrial white matter approaching the splenium of the corpus callosum (image 77), and occipital lobe white matter (image 70). Associated mild T2 and FLAIR hyperintensity in these areas with no evidence of hemorrhage. No mass effect. No other restricted diffusion. Patchy bilateral periventricular white matter T2 and FLAIR hyperintensity with some areas most resembling chronic white matter lacunar infarcts. But no chronic cortical  encephalomalacia identified. No chronic cerebral blood products. No midline shift, mass effect, evidence of mass lesion, ventriculomegaly, extra-axial collection or acute intracranial hemorrhage. Cervicomedullary junction and pituitary are within normal limits. Vascular: Major intracranial vascular flow voids are preserved. Skull and upper cervical spine: Partially visible cervical spine degeneration including ligamentous hypertrophy about the odontoid. Visualized bone marrow signal is within normal limits. Sinuses/Orbits: Postoperative changes to both globes. No acute findings. Other: Mild left mastoid effusion.  Visible pharynx appears normal. IMPRESSION: 1. 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 No associated hemorrhage or mass effect. 2. Underlying chronic white matter signal changes most likely due to small vessel disease. 3. Mild left mastoid effusion, likely postinflammatory. Electronically Signed   By: Genevie Ann M.D.   On: 11/04/2019 20:53   DG Foot 2 Views Right  Result Date: 11/08/2019 CLINICAL DATA:  Plantar foot pain cross the ball of the foot at the first through third metatarsal-phalangeal joint region. EXAM: RIGHT FOOT - 2 VIEW COMPARISON:  None. FINDINGS: Mild pes cavus and hammertoe deformity of the third and fourth phalanges. Trace osteoarthritis of the first metatarsal phalangeal joint. No erosion, periosteal reaction, or bony destruction. No plantar calcaneal spur. No focal soft tissue abnormality. IMPRESSION: 1. Mild osteoarthritis of the first metatarsophalangeal joint. 2. Pes cavus and hammertoe deformity of the third and fourth phalanges. Electronically Signed   By: Keith Rake M.D.   On: 11/08/2019 15:09   ECHOCARDIOGRAM COMPLETE  Result Date: 11/05/2019    ECHOCARDIOGRAM REPORT   Patient Name:   Bianca [name] Date of Exam: 11/05/2019 Medical Rec #:  [id number]        Height:       60.0 in Accession #:     [id number]       Weight:       80.2 lb Date of Birth:  1923/04/05         BSA:          1.266 m Patient Age:    89 years         BP:           117/54 mmHg Patient Gender: F                HR:           52 bpm. Exam Location:  Inpatient Procedure: 2D Echo, Cardiac Doppler  and Color Doppler Indications:    COVID-19  History:        Patient has no prior history of Echocardiogram examinations.  Sonographer:    Jonelle Sidle Dance Referring Phys: Lewie Chamber AKULA IMPRESSIONS  1. Left ventricular ejection fraction, by estimation, is 60 to 65%. The left ventricle has normal function. The left ventricle has no regional wall motion abnormalities. There is mild left ventricular hypertrophy. Left ventricular diastolic parameters are consistent with Grade II diastolic dysfunction (pseudonormalization). Elevated left atrial pressure.  2. Right ventricular systolic function is normal. The right ventricular size is normal. There is mildly elevated pulmonary artery systolic pressure. The estimated right ventricular systolic pressure is 71.6 mmHg.  3. Right atrial size was mildly dilated.  4. The mitral valve is normal in structure. Trivial mitral valve regurgitation.  5. Tricuspid valve regurgitation is moderate to severe.  6. The aortic valve is tricuspid. Aortic valve regurgitation is mild. Mild aortic valve sclerosis is present, with no evidence of aortic valve stenosis.  7. The inferior vena cava is normal in size with greater than 50% respiratory variability, suggesting right atrial pressure of 3 mmHg. FINDINGS  Left Ventricle: Left ventricular ejection fraction, by estimation, is 60 to 65%. The left ventricle has normal function. The left ventricle has no regional wall motion abnormalities. The left ventricular internal cavity size was small. There is mild left ventricular hypertrophy. Left ventricular diastolic parameters are consistent with Grade II diastolic dysfunction (pseudonormalization). Elevated left atrial pressure.  Right Ventricle: The right ventricular size is normal. Right vetricular wall thickness was not assessed. Right ventricular systolic function is normal. There is mildly elevated pulmonary artery systolic pressure. The tricuspid regurgitant velocity is 2.97 m/s, and with an assumed right atrial pressure of 3 mmHg, the estimated right ventricular systolic pressure is 96.7 mmHg. Left Atrium: Left atrial size was normal in size. Right Atrium: Right atrial size was mildly dilated. Pericardium: Trivial pericardial effusion is present. Mitral Valve: The mitral valve is normal in structure. Trivial mitral valve regurgitation. Tricuspid Valve: The tricuspid valve is normal in structure. Tricuspid valve regurgitation is moderate to severe. Aortic Valve: The aortic valve is tricuspid. Aortic valve regurgitation is mild. Aortic regurgitation PHT measures 558 msec. Mild aortic valve sclerosis is present, with no evidence of aortic valve stenosis. Pulmonic Valve: The pulmonic valve was grossly normal. Pulmonic valve regurgitation is trivial. Aorta: The aortic root and ascending aorta are structurally normal, with no evidence of dilitation. Venous: The inferior vena cava is normal in size with greater than 50% respiratory variability, suggesting right atrial pressure of 3 mmHg. IAS/Shunts: The interatrial septum was not well visualized.  LEFT VENTRICLE PLAX 2D LVIDd:         3.21 cm LVIDs:         2.01 cm LV PW:         1.15 cm LV IVS:        0.90 cm LVOT diam:     1.70 cm LV SV:         47 LV SV Index:   37 LVOT Area:     2.27 cm  RIGHT VENTRICLE          IVC RV Basal diam:  2.88 cm  IVC diam: 1.49 cm RV Mid diam:    1.94 cm TAPSE (M-mode): 1.8 cm LEFT ATRIUM             Index       RIGHT ATRIUM  Index LA diam:        3.20 cm 2.53 cm/m  RA Area:     13.90 cm LA Vol (A2C):   44.4 ml 35.07 ml/m RA Volume:   34.40 ml  27.17 ml/m LA Vol (A4C):   35.0 ml 27.64 ml/m LA Biplane Vol: 40.9 ml 32.30 ml/m  AORTIC VALVE LVOT  Vmax:   94.80 cm/s LVOT Vmean:  63.000 cm/s LVOT VTI:    0.205 m AI PHT:      558 msec  AORTA Ao Root diam: 2.80 cm Ao Asc diam:  2.70 cm MITRAL VALVE                TRICUSPID VALVE MV Area (PHT): 3.21 cm     TR Peak grad:   35.3 mmHg MV Decel Time: 236 msec     TR Vmax:        297.00 cm/s MV E velocity: 102.00 cm/s MV A velocity: 68.30 cm/s   SHUNTS MV E/A ratio:  1.49         Systemic VTI:  0.20 m                             Systemic Diam: 1.70 cm Oswaldo Milian MD Electronically signed by Oswaldo Milian MD Signature Date/Time: 11/05/2019/5:59:08 PM    Final    VAS US CAROTID  Result Date: 11/09/2019 Carotid Arterial Duplex Study Indications:       CVA. Comparison Study:  no prior Performing Technologist: Abram Sander RVS  Examination Guidelines: A complete evaluation includes B-mode imaging, spectral Doppler, color Doppler, and power Doppler as needed of all accessible portions of each vessel. Bilateral testing is considered an integral part of a complete examination. Limited examinations for reoccurring indications may be performed as noted.  Right Carotid Findings: +----------+--------+--------+--------+------------------+--------+             PSV cm/s EDV cm/s Stenosis Plaque Description Comments  +----------+--------+--------+--------+------------------+--------+  CCA Prox   59       12                heterogenous                 +----------+--------+--------+--------+------------------+--------+  CCA Distal 52       7                 heterogenous                 +----------+--------+--------+--------+------------------+--------+  ICA Prox   34       10       1-39%    heterogenous                 +----------+--------+--------+--------+------------------+--------+  ICA Distal 47       11                                             +----------+--------+--------+--------+------------------+--------+  ECA        55                                                       +----------+--------+--------+--------+------------------+--------+ +----------+--------+-------+--------+-------------------+  PSV cm/s EDV cms Describe Arm Pressure (mmHG)  +----------+--------+-------+--------+-------------------+  Subclavian 76                                             +----------+--------+-------+--------+-------------------+ +---------+--------+--+--------+--+---------+  Vertebral PSV cm/s 52 EDV cm/s 14 Antegrade  +---------+--------+--+--------+--+---------+  Left Carotid Findings: +----------+--------+--------+--------+------------------+--------+             PSV cm/s EDV cm/s Stenosis Plaque Description Comments  +----------+--------+--------+--------+------------------+--------+  CCA Prox   57       14                heterogenous                 +----------+--------+--------+--------+------------------+--------+  CCA Distal 63       12                heterogenous                 +----------+--------+--------+--------+------------------+--------+  ICA Prox   59       13       1-39%    heterogenous       tortuous  +----------+--------+--------+--------+------------------+--------+  ICA Distal 86       14                                             +----------+--------+--------+--------+------------------+--------+  ECA        77                                                      +----------+--------+--------+--------+------------------+--------+ +----------+--------+--------+--------+-------------------+             PSV cm/s EDV cm/s Describe Arm Pressure (mmHG)  +----------+--------+--------+--------+-------------------+  Subclavian 86                                              +----------+--------+--------+--------+-------------------+ +---------+--------+--+--------+-+---------+  Vertebral PSV cm/s 86 EDV cm/s 9 Antegrade  +---------+--------+--+--------+-+---------+   Summary: Right Carotid: Velocities in the right ICA are consistent with a 1-39% stenosis. Left Carotid:  Velocities in the left ICA are consistent with a 1-39% stenosis. Vertebrals: Bilateral vertebral arteries demonstrate antegrade flow. *See table(s) above for measurements and observations.  Electronically signed by Antony Contras MD on 11/09/2019 at 1:20:26 PM.    Final     Assessment/Plan There are no diagnoses linked to this encounter.   Family/ staff Communication:   Labs/tests ordered:   This encounter was created in error - please disregard.

## 2019-11-12 NOTE — TOC Transition Note (Signed)
Transition of Care [facility]) - CM/SW Discharge Note   Patient Details  Name: Bianca [name] MRN: [id number] Date of Birth: [birth year]  [facility] CM/SW Contact:  Dessa Phi, RN Phone Number: 11/12/2019, 12:11 PM   Clinical Narrative:  PTAR called.      Final next level of care: Skilled Nursing Facility Barriers to Discharge: No Barriers Identified   Patient Goals and CMS Choice Patient states their goals for this hospitalization and ongoing recovery are:: return back to [facility] CMS Medicare.gov Compare Post Acute Care list provided to:: Patient Represenative (must comment) Trellis Paganini) Choice offered to / list presented to : Adult Children (Nephew)  Discharge Placement PASRR number recieved: 11/12/19            Patient chooses bed at: [facility] Patient to be transferred to facility by: [facility] Name of family member notified: Liliane Channel nephew [phone] Patient and family notified of of transfer: 11/12/19  Discharge Plan and Services   Discharge Planning Services: CM Consult Post Acute Care Choice:  (ALF)                               Social Determinants of Health (SDOH) Interventions     Readmission Risk Interventions No flowsheet data found.

## 2019-11-12 NOTE — Plan of Care (Signed)
  Problem: Education: Goal: Knowledge of General Education information will improve Description: Including pain rating scale, medication(s)/side effects and non-pharmacologic comfort measures Outcome: Adequate for Discharge   Problem: Health Behavior/Discharge Planning: Goal: Ability to manage health-related needs will improve Outcome: Adequate for Discharge   Problem: Clinical Measurements: Goal: Ability to maintain clinical measurements within normal limits will improve Outcome: Adequate for Discharge Goal: Will remain free from infection Outcome: Adequate for Discharge Goal: Diagnostic test results will improve Outcome: Adequate for Discharge Goal: Respiratory complications will improve Outcome: Adequate for Discharge Goal: Cardiovascular complication will be avoided Outcome: Adequate for Discharge   Problem: Activity: Goal: Risk for activity intolerance will decrease Outcome: Adequate for Discharge   Problem: Nutrition: Goal: Adequate nutrition will be maintained Outcome: Adequate for Discharge   Problem: Coping: Goal: Level of anxiety will decrease Outcome: Adequate for Discharge   Problem: Pain Managment: Goal: General experience of comfort will improve Outcome: Adequate for Discharge   Problem: Safety: Goal: Ability to remain free from injury will improve Outcome: Adequate for Discharge   Problem: Skin Integrity: Goal: Risk for impaired skin integrity will decrease Outcome: Adequate for Discharge   Problem: Education: Goal: Knowledge of secondary prevention will improve Outcome: Adequate for Discharge Goal: Knowledge of patient specific risk factors addressed and post discharge goals established will improve Outcome: Adequate for Discharge   Problem: Coping: Goal: Will verbalize positive feelings about self Outcome: Adequate for Discharge Goal: Will identify appropriate support needs Outcome: Adequate for Discharge   Problem: Health Behavior/Discharge  Planning: Goal: Ability to manage health-related needs will improve Outcome: Adequate for Discharge   Problem: Self-Care: Goal: Ability to participate in self-care as condition permits will improve Outcome: Adequate for Discharge Goal: Ability to communicate needs accurately will improve Outcome: Adequate for Discharge   Problem: Ischemic Stroke/TIA Tissue Perfusion: Goal: Complications of ischemic stroke/TIA will be minimized Outcome: Adequate for Discharge

## 2019-11-12 NOTE — Discharge Summary (Signed)
Physician Discharge Summary  Bianca [name] WFU:[id number] DOB: May 29, 1922 DOA: 11/02/2019  PCP: [name], [name], NP  Admit date: 11/02/2019 Discharge date: 11/11/2019  Admitted From: alf Disposition:  [facility] -   Recommendations for Outpatient Follow-up:  1. Follow up with PCP in 1-2 weeks 2. Please obtain BMP/CBC in one week Please follow up with neurology as recommended.  Please follow up with Dr Einar Gip in 2 to 4 weeks.   Discharge Condition:stable.  CODE STATUS:DNR Diet recommendation: Heart Healthy  The patient was seen and examined this morning stable no acute distress. All vitals, labs, notes were reviewed... No changes discharge summary  Disposition -pending discharge to SNF-due to  PASRR approved  ------------------------------------------------------------------------------------------------------------------------------------ Brief/Interim Summary:  86 year old lady with prior history of SVT on metoprolol, GERD presents from ALF with altered mental status.  On arrival to ED she was found to be bradycardic.  EKG shows sinus bradycardia.  Hospital course was complicated by transient tachycardia in the setting of holding home beta-blockers for paroxysmal SVT. Currently she remains bradycardic and asymptomatic from the bradycardia.  She also underwent CT head without contrast initially, which was negative for stroke. She did not have any focal decifits.  PT evaluation recommending Home health PT. SLP evaluation recommending dysphagia 3 diet with thin liquid. In view of her acute confusion , MRI of the brain was done, showed multiple acute strokes in the left hemisphere.  Neurology consulted, requested transfer the patient to [facility]. She couldn't be transferred due to bed unavailability. Neurology consulted and underwent stroke work up at [facility].  Her hospital course was complicated by tachybradycardia syndrome and atrial tachycardia at which point cardiology was consulted.  She was  started on low-dose amiodarone 200 mg daily.  Her rates been controlled so far. Patient complained of right foot pain.  X-rays of the foot ordered for further evaluation.  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 Physical therapy evaluation recommending SNF.  TOC on board and [facility] Patient seen and examined at bedside new complaints at this time wanted to know when she can be discharged.  Discharge Diagnoses:  Principal Problem:   Altered mental status Active Problems:   Paroxysmal SVT (supraventricular tachycardia) (HCC)   CKD (chronic kidney disease) stage 3, GFR 30-59 ml/min   Sinus bradycardia   Acute lower UTI   Tachycardia   Hypotension   Stroke (HCC)   Adjustment disorder with mixed anxiety and depressed mood  Left hemisphere stroke: -  MRI brain showed Several scattered small acute infarcts in the posterior left hemisphere, The Left PCA and Left MCA/PCA watershed area. No associated hemorrhage or mass effect.  Underlying chronic white matter signal changes most likely due to small vessel disease. Possibly an embolic stroke.  Cardiology consulted for evaluation of atrial fibrillation. No focal deficits.  Stroke work up ordered. MRA head ordered and appears to wnl Carotid duplex Right Carotid: Velocities in the right ICA are consistent with a 1-39% stenosis. Left Carotid: Velocities in the left ICA are consistent with a 1-39%  stenosis.  Echocardiogram showed Left ventricular ejection fraction, by estimation, is 60 to 65%. The left ventricle has normal function. The left ventricle has no regional wall motion abnormalities. There is mild left ventricular hypertrophy. Left ventricular diastolic parameters are consistent with Grade II diastolic dysfunction (pseudonormalization). Elevated left atrial pressure. A1c is 5.9% lipid panel shows LDL of 72, started her on Lipitor 20 mg daily.   Discussed  with cardiology  and started her on eliquis 2.5 mg BID.  PT evaluation recommending snf with 24 hours supervision.  SLP eval recommending dysphagia 3 diet  Neurology consulted and recommendations given. No new complaints at this time.   Acute encephalopathy probably secondary to urinary tract infection and medications and stroke.  TSH within normal limits Vitamin B12 and ammonia are within normal limits Patient was started on IV Rocephin for urinary tract infection. Urine cultures grew 60,000 E. Coli sensitive to keflex. Transitioned to keflex to complete the course.  Completed the course of Keflex.  Hypotension: Probably secondary to decreased oral intake versus dehydration from infection .  Resolved.   Tachybradycardia syndrome/atrial tachycardia/ atrial flutter Asymptomatic. Cardiology consulted, started her on amiodarone 200 mg , consideration 200 mg of amiodarone daily today. Started her on low dose eliquis 2.5 mg BID.    GERD Stable.    Mild AKI Probably secondary to dehydration Patient came in with a creatinine of 1.1 improved to 0.7 with IV fluids.  No new labs today.  Nutrition:  On soft diet.  Dietary consulted.   Pt reported that she is ready to die. She denies any suicidal or homicidal ideations.  Palliative care on board, and recommended outpatient palliative follow up .  Psychiatry consulted, added zoloft for mood improvement.    Right foot pain.  Pain control with tramadol and x rays of the foot ordered.  X-rays of the foot showed mild osteoarthritis of the first metatarsophalangeal joint .   Discharge Instructions  Discharge Instructions    Ambulatory referral to Neurology   Complete by: As directed    Follow up with Dr. Leonie Man at [facility] in 4-6 weeks.  Thanks.     Allergies as of 11/12/2019      Reactions   Azopt [brinzolamide] Other (See Comments)   UNK   Brimonidine Other (See Comments)   UNK   Lumigan [bimatoprost] Other (See Comments)   UNK       Medication List    STOP taking these medications   metoprolol tartrate 25 MG tablet Commonly known as: LOPRESSOR     TAKE these medications   acetaminophen 325 MG tablet Commonly known as: TYLENOL Take 650 mg by mouth every 4 (four) hours as needed for mild pain or headache.   amiodarone 100 MG tablet Commonly known as: PACERONE Take 1 tablet (100 mg total) by mouth daily.   apixaban 2.5 MG Tabs tablet Commonly known as: ELIQUIS Take 1 tablet (2.5 mg total) by mouth 2 (two) times daily.   atorvastatin 20 MG tablet Commonly known as: LIPITOR Take 1 tablet (20 mg total) by mouth daily at 6 PM.   b complex vitamins tablet Take 1 tablet by mouth daily.   bismuth subsalicylate 102 VO/53GU suspension Commonly known as: PEPTO BISMOL Take 60 mLs by mouth daily as needed for indigestion or diarrhea or loose stools.   Calcium Carbonate-Vitamin D 600-400 MG-UNIT tablet Take 1 tablet by mouth daily.   CENTRUM SILVER PO Take 1 tablet by mouth daily. 7am-10am.   diphenoxylate-atropine 2.5-0.025 MG/5ML liquid Commonly known as: LOMOTIL Take by mouth 3 (three) times daily as needed for diarrhea or loose stools.   melatonin 3 MG Tabs tablet Take 3 mg by mouth at bedtime.   memantine 5 MG tablet Commonly known as: NAMENDA Take 5 mg by mouth daily.   metoprolol succinate 25 MG 24 hr tablet Commonly known as: TOPROL-XL Take 1 tablet (25 mg total) by mouth daily.  NON FORMULARY Moisturizing Cream to extremities with morning and evening care Every Shift   NON FORMULARY Regular Special Instructions: Regular Diet, Thin Liquids   omeprazole 20 MG capsule Commonly known as: PRILOSEC Take 20 mg by mouth daily.   potassium chloride SA 20 MEQ tablet Commonly known as: KLOR-CON Take 20 mEq by mouth daily.   sertraline 25 MG tablet Commonly known as: ZOLOFT Take 0.5 tablets (12.5 mg total) by mouth at bedtime.   timolol 0.5 % ophthalmic solution Commonly known as:  BETIMOL Place 1 drop into both eyes daily. 7am-10am.   torsemide 20 MG tablet Commonly known as: DEMADEX Take 20 mg by mouth daily.       Follow-up Information    Garvin Fila, MD. Schedule an appointment as soon as possible for a visit in 4 week(s).   Specialties: Neurology, Radiology Contact information: [address] [place] [address] [phone]              Allergies  Allergen Reactions  . Azopt [Brinzolamide] Other (See Comments)    UNK  . Brimonidine Other (See Comments)    UNK  . Lumigan [Bimatoprost] Other (See Comments)    UNK    Consultations:  Cardiology  Neurology  Psychiatry.    Procedures/Studies: DG Chest 2 View  Result Date: 11/02/2019 CLINICAL DATA:  Altered mental status EXAM: CHEST - 2 VIEW COMPARISON:  05/30/2019 FINDINGS: The heart size and mediastinal contours are stable. Mildly hyperexpanded lungs without focal airspace consolidation, pleural effusion, or pneumothorax. The visualized skeletal structures are unremarkable. IMPRESSION: No active cardiopulmonary disease. Electronically Signed   By: Davina Poke D.O.   On: 11/02/2019 11:42   CT Head Wo Contrast  Result Date: 11/02/2019 CLINICAL DATA:  Mental status change EXAM: CT HEAD WITHOUT CONTRAST TECHNIQUE: Contiguous axial images were obtained from the base of the skull through the vertex without intravenous contrast. COMPARISON:  CT head 08/07/2005 FINDINGS: Brain: Moderate atrophy with progression since the prior study. Atrophy most prominent in the frontal lobes. There is ventricular enlargement due to atrophy which has progressed. Periventricular white matter hypodensity bilaterally also has progressed. Negative for acute infarct, hemorrhage, mass. Vascular: Negative for hyperdense vessel Skull: Negative Sinuses/Orbits: Paranasal sinuses clear. Bilateral cataract extraction. Other: None IMPRESSION: No acute abnormality Progression of atrophy and chronic  microvascular ischemic change in the white matter compared with 2007. Electronically Signed   By: Franchot Gallo M.D.   On: 11/02/2019 11:32   MR ANGIO HEAD WO CONTRAST  Result Date: 11/05/2019 CLINICAL DATA:  Initial evaluation for acute stroke. EXAM: MRA HEAD WITHOUT CONTRAST TECHNIQUE: Angiographic images of the Circle of Willis were obtained using MRA technique without intravenous contrast. COMPARISON:  Prior brain MRI from 11/04/2019. FINDINGS: ANTERIOR CIRCULATION: Visualized distal cervical segments of the internal carotid arteries are widely patent with symmetric antegrade flow. Petrous, cavernous, and supraclinoid ICAs widely patent without stenosis or other abnormality. Origin of the ophthalmic arteries patent. ICA termini well perfused. A1 segments patent bilaterally. Normal anterior communicating artery complex. Anterior cerebral arteries widely patent to their distal aspects without stenosis. No M1 stenosis or occlusion. Normal MCA bifurcations. Distal MCA branches well perfused and symmetric. POSTERIOR CIRCULATION: Vertebral arteries widely patent to the vertebrobasilar junction without stenosis. Left vertebral artery dominant. Partially visualized posterior inferior cerebral arteries patent bilaterally. Basilar widely patent to its distal aspect without stenosis. Superior cerebral arteries patent bilaterally. Both PCAs primarily supplied via the basilar well perfused to their distal aspects. No intracranial aneurysm  or other vascular abnormality. IMPRESSION: Normal intracranial MRA. No large vessel occlusion, hemodynamically significant stenosis, or other vascular abnormality. No aneurysm. Electronically Signed   By: Jeannine Boga M.D.   On: 11/05/2019 19:49   MR BRAIN WO CONTRAST  Result Date: 11/04/2019 CLINICAL DATA:  86 year old female with altered mental status for 2 days. EXAM: MRI HEAD WITHOUT CONTRAST TECHNIQUE: Multiplanar, multiecho pulse sequences of the brain and surrounding  structures were obtained without intravenous contrast. COMPARISON:  Head CT 11/02/2019. FINDINGS: Brain: Patchy, scattered restricted diffusion in the posterior left hemisphere seen in the inferior left parietal lobe cortex (series 5, image 81), left periatrial white matter approaching the splenium of the corpus callosum (image 77), and occipital lobe white matter (image 70). Associated mild T2 and FLAIR hyperintensity in these areas with no evidence of hemorrhage. No mass effect. No other restricted diffusion. Patchy bilateral periventricular white matter T2 and FLAIR hyperintensity with some areas most resembling chronic white matter lacunar infarcts. But no chronic cortical encephalomalacia identified. No chronic cerebral blood products. No midline shift, mass effect, evidence of mass lesion, ventriculomegaly, extra-axial collection or acute intracranial hemorrhage. Cervicomedullary junction and pituitary are within normal limits. Vascular: Major intracranial vascular flow voids are preserved. Skull and upper cervical spine: Partially visible cervical spine degeneration including ligamentous hypertrophy about the odontoid. Visualized bone marrow signal is within normal limits. Sinuses/Orbits: Postoperative changes to both globes. No acute findings. Other: Mild left mastoid effusion.  Visible pharynx appears normal. IMPRESSION: 1. 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 No associated hemorrhage or mass effect. 2. Underlying chronic white matter signal changes most likely due to small vessel disease. 3. Mild left mastoid effusion, likely postinflammatory. Electronically Signed   By: Genevie Ann M.D.   On: 11/04/2019 20:53   DG Foot 2 Views Right  Result Date: 11/08/2019 CLINICAL DATA:  Plantar foot pain cross the ball of the foot at the first through third metatarsal-phalangeal joint region. EXAM: RIGHT FOOT - 2 VIEW COMPARISON:  None.  FINDINGS: Mild pes cavus and hammertoe deformity of the third and fourth phalanges. Trace osteoarthritis of the first metatarsal phalangeal joint. No erosion, periosteal reaction, or bony destruction. No plantar calcaneal spur. No focal soft tissue abnormality. IMPRESSION: 1. Mild osteoarthritis of the first metatarsophalangeal joint. 2. Pes cavus and hammertoe deformity of the third and fourth phalanges. Electronically Signed   By: Keith Rake M.D.   On: 11/08/2019 15:09   ECHOCARDIOGRAM COMPLETE  Result Date: 11/05/2019    ECHOCARDIOGRAM REPORT   Patient Name:   Bianca [name] Date of Exam: 11/05/2019 Medical Rec #:  [id number]        Height:       60.0 in Accession #:    [id number]       Weight:       80.2 lb Date of Birth:  1922-06-19         BSA:          1.266 m Patient Age:    86 years         BP:           117/54 mmHg Patient Gender: F                HR:           52 bpm. Exam Location:  Inpatient Procedure: 2D Echo, Cardiac Doppler and Color Doppler Indications:    COVID-19  History:  Patient has no prior history of Echocardiogram examinations.  Sonographer:    Jonelle Sidle Dance Referring Phys: Lewie Chamber AKULA IMPRESSIONS  1. Left ventricular ejection fraction, by estimation, is 60 to 65%. The left ventricle has normal function. The left ventricle has no regional wall motion abnormalities. There is mild left ventricular hypertrophy. Left ventricular diastolic parameters are consistent with Grade II diastolic dysfunction (pseudonormalization). Elevated left atrial pressure.  2. Right ventricular systolic function is normal. The right ventricular size is normal. There is mildly elevated pulmonary artery systolic pressure. The estimated right ventricular systolic pressure is 95.6 mmHg.  3. Right atrial size was mildly dilated.  4. The mitral valve is normal in structure. Trivial mitral valve regurgitation.  5. Tricuspid valve regurgitation is moderate to severe.  6. The aortic valve is tricuspid.  Aortic valve regurgitation is mild. Mild aortic valve sclerosis is present, with no evidence of aortic valve stenosis.  7. The inferior vena cava is normal in size with greater than 50% respiratory variability, suggesting right atrial pressure of 3 mmHg. FINDINGS  Left Ventricle: Left ventricular ejection fraction, by estimation, is 60 to 65%. The left ventricle has normal function. The left ventricle has no regional wall motion abnormalities. The left ventricular internal cavity size was small. There is mild left ventricular hypertrophy. Left ventricular diastolic parameters are consistent with Grade II diastolic dysfunction (pseudonormalization). Elevated left atrial pressure. Right Ventricle: The right ventricular size is normal. Right vetricular wall thickness was not assessed. Right ventricular systolic function is normal. There is mildly elevated pulmonary artery systolic pressure. The tricuspid regurgitant velocity is 2.97 m/s, and with an assumed right atrial pressure of 3 mmHg, the estimated right ventricular systolic pressure is 21.3 mmHg. Left Atrium: Left atrial size was normal in size. Right Atrium: Right atrial size was mildly dilated. Pericardium: Trivial pericardial effusion is present. Mitral Valve: The mitral valve is normal in structure. Trivial mitral valve regurgitation. Tricuspid Valve: The tricuspid valve is normal in structure. Tricuspid valve regurgitation is moderate to severe. Aortic Valve: The aortic valve is tricuspid. Aortic valve regurgitation is mild. Aortic regurgitation PHT measures 558 msec. Mild aortic valve sclerosis is present, with no evidence of aortic valve stenosis. Pulmonic Valve: The pulmonic valve was grossly normal. Pulmonic valve regurgitation is trivial. Aorta: The aortic root and ascending aorta are structurally normal, with no evidence of dilitation. Venous: The inferior vena cava is normal in size with greater than 50% respiratory variability, suggesting right  atrial pressure of 3 mmHg. IAS/Shunts: The interatrial septum was not well visualized.  LEFT VENTRICLE PLAX 2D LVIDd:         3.21 cm LVIDs:         2.01 cm LV PW:         1.15 cm LV IVS:        0.90 cm LVOT diam:     1.70 cm LV SV:         47 LV SV Index:   37 LVOT Area:     2.27 cm  RIGHT VENTRICLE          IVC RV Basal diam:  2.88 cm  IVC diam: 1.49 cm RV Mid diam:    1.94 cm TAPSE (M-mode): 1.8 cm LEFT ATRIUM             Index       RIGHT ATRIUM           Index LA diam:        3.20 cm  2.53 cm/m  RA Area:     13.90 cm LA Vol (A2C):   44.4 ml 35.07 ml/m RA Volume:   34.40 ml  27.17 ml/m LA Vol (A4C):   35.0 ml 27.64 ml/m LA Biplane Vol: 40.9 ml 32.30 ml/m  AORTIC VALVE LVOT Vmax:   94.80 cm/s LVOT Vmean:  63.000 cm/s LVOT VTI:    0.205 m AI PHT:      558 msec  AORTA Ao Root diam: 2.80 cm Ao Asc diam:  2.70 cm MITRAL VALVE                TRICUSPID VALVE MV Area (PHT): 3.21 cm     TR Peak grad:   35.3 mmHg MV Decel Time: 236 msec     TR Vmax:        297.00 cm/s MV E velocity: 102.00 cm/s MV A velocity: 68.30 cm/s   SHUNTS MV E/A ratio:  1.49         Systemic VTI:  0.20 m                             Systemic Diam: 1.70 cm Oswaldo Milian MD Electronically signed by Oswaldo Milian MD Signature Date/Time: 11/05/2019/5:59:08 PM    Final    VAS US CAROTID  Result Date: 11/09/2019 Carotid Arterial Duplex Study Indications:       CVA. Comparison Study:  no prior Performing Technologist: Abram Sander RVS  Examination Guidelines: A complete evaluation includes B-mode imaging, spectral Doppler, color Doppler, and power Doppler as needed of all accessible portions of each vessel. Bilateral testing is considered an integral part of a complete examination. Limited examinations for reoccurring indications may be performed as noted.  Right Carotid Findings: +----------+--------+--------+--------+------------------+--------+           PSV cm/sEDV cm/sStenosisPlaque DescriptionComments  +----------+--------+--------+--------+------------------+--------+ CCA Prox  59      12              heterogenous               +----------+--------+--------+--------+------------------+--------+ CCA Distal52      7               heterogenous               +----------+--------+--------+--------+------------------+--------+ ICA Prox  34      10      1-39%   heterogenous               +----------+--------+--------+--------+------------------+--------+ ICA Distal47      11                                         +----------+--------+--------+--------+------------------+--------+ ECA       55                                                 +----------+--------+--------+--------+------------------+--------+ +----------+--------+-------+--------+-------------------+           PSV cm/sEDV cmsDescribeArm Pressure (mmHG) +----------+--------+-------+--------+-------------------+ [id number]                                         +----------+--------+-------+--------+-------------------+ +---------+--------+--+--------+--+---------+ VertebralPSV  cm/s52EDV cm/s14Antegrade +---------+--------+--+--------+--+---------+  Left Carotid Findings: +----------+--------+--------+--------+------------------+--------+           PSV cm/sEDV cm/sStenosisPlaque DescriptionComments +----------+--------+--------+--------+------------------+--------+ CCA Prox  57      14              heterogenous               +----------+--------+--------+--------+------------------+--------+ CCA Distal63      12              heterogenous               +----------+--------+--------+--------+------------------+--------+ ICA Prox  59      13      1-39%   heterogenous      tortuous +----------+--------+--------+--------+------------------+--------+ ICA Distal86      14                                          +----------+--------+--------+--------+------------------+--------+ ECA       77                                                 +----------+--------+--------+--------+------------------+--------+ +----------+--------+--------+--------+-------------------+           PSV cm/sEDV cm/sDescribeArm Pressure (mmHG) +----------+--------+--------+--------+-------------------+ [id number]                                          +----------+--------+--------+--------+-------------------+ +---------+--------+--+--------+-+---------+ VertebralPSV cm/s86EDV cm/s9Antegrade +---------+--------+--+--------+-+---------+   Summary: Right Carotid: Velocities in the right ICA are consistent with a 1-39% stenosis. Left Carotid: Velocities in the left ICA are consistent with a 1-39% stenosis. Vertebrals: Bilateral vertebral arteries demonstrate antegrade flow. *See table(s) above for measurements and observations.  Electronically signed by Antony Contras MD on 11/09/2019 at 1:20:26 PM.    Final       Subjective:  No new complaints.  Discharge Exam: Vitals:   11/12/19 0515 11/12/19 0542  BP:  103/60  Pulse:    Resp: 19 17  Temp:    SpO2:     Vitals:   11/12/19 0423 11/12/19 0424 11/12/19 0515 11/12/19 0542  BP: 97/69 93/68  103/60  Pulse: (!) 134 (!) 134    Resp: 18 18 19 17   Temp: 98 F (36.7 C) 98 F (36.7 C)    TempSrc:      SpO2: 96% 97%    Weight:      Height:        General: Pt is alert, awake, not in acute distress Cardiovascular: RRR, S1/S2 +, no rubs, no gallops Respiratory: CTA bilaterally, no wheezing, no rhonchi Abdominal: Soft, NT, ND, bowel sounds + Extremities: no edema, no cyanosis    The results of significant diagnostics from this hospitalization (including imaging, microbiology, ancillary and laboratory) are listed below for reference.     Microbiology: Recent Results (from the past 240 hour(s))  Urine culture     Status: Abnormal   Collection Time:  11/02/19 10:47 AM   Specimen: Urine, Random  Result Value Ref Range Status   Specimen Description   Final    URINE, RANDOM Performed at [facility], [address] [place], [place] [address]    Special Requests   Final  NONE Performed at [facility], [place] [address] [place], [place] [address]    Culture 60,000 COLONIES/mL ESCHERICHIA COLI (A)  Final   Report Status 11/05/2019 FINAL  Final   Organism ID, Bacteria ESCHERICHIA COLI (A)  Final      Susceptibility   Escherichia coli - MIC*    AMPICILLIN >=32 RESISTANT Resistant     CEFAZOLIN 16 SENSITIVE Sensitive     CEFTRIAXONE <=0.25 SENSITIVE Sensitive     CIPROFLOXACIN <=0.25 SENSITIVE Sensitive     GENTAMICIN <=1 SENSITIVE Sensitive     IMIPENEM <=0.25 SENSITIVE Sensitive     NITROFURANTOIN 32 SENSITIVE Sensitive     TRIMETH/SULFA >=320 RESISTANT Resistant     AMPICILLIN/SULBACTAM >=32 RESISTANT Resistant     PIP/TAZO <=4 SENSITIVE Sensitive     * 60,000 COLONIES/mL ESCHERICHIA COLI  Blood culture (routine x 2)     Status: None   Collection Time: 11/02/19  2:38 PM   Specimen: BLOOD  Result Value Ref Range Status   Specimen Description   Final    BLOOD RIGHT ANTECUBITAL Performed at [facility], [place] [address], [place], [place] [address]    Special Requests   Final    BOTTLES DRAWN AEROBIC ONLY Blood Culture adequate volume Performed at [place] [address], [place], [place] [address]    Culture   Final    NO GROWTH 5 DAYS Performed at [facility], [place] [address] [place], [place] [address]    Report Status 11/07/2019 FINAL  Final  SARS Coronavirus 2 by RT PCR (hospital order, performed in [facility] hospital lab) Nasopharyngeal Nasopharyngeal Swab     Status: None   Collection Time: 11/02/19  2:40 PM   Specimen: Nasopharyngeal Swab  Result Value Ref Range Status   SARS Coronavirus 2 NEGATIVE NEGATIVE Final    Comment:   MRSA PCR Screening      Status: None   Collection Time: 11/04/19  2:02 PM   Specimen: Nasal Mucosa; Nasopharyngeal  Result      MRSA by PCR       Comment:   SARS Coronavirus 2 by RT PCR (hospital order, performed in [facility] hospital lab) Nasopharyngeal Nasopharyngeal Swab     Status: None   Collection Time: 11/09/19 12:15 PM   Specimen: Nasopharyngeal Swab  Result      SARS Coronavirus 2       Comment:      Labs: BNP (last 3 results) No results for input(s): BNP in the last 8760 hours. Basic Metabolic Panel: Recent Labs  Lab 11/06/19 0544  NA 138  K 3.9  CL 107  CO2 23  GLUCOSE 93  BUN 9  CREATININE 0.72  CALCIUM 8.1*   Liver Function Tests: Recent Labs  Lab 11/06/19 0544  AST 34  ALT 18  ALKPHOS 45  BILITOT 0.3  PROT 5.1*  ALBUMIN 2.9*   No results for input(s): LIPASE, AMYLASE in the last 168 hours. No results for input(s): AMMONIA in the last 168 hours. CBC: Recent Labs  Lab 11/06/19 0544  WBC 4.3  HGB 12.8  HCT 41.1  MCV 97.4  PLT 206    Anemia work up No results for input(s): VITAMINB12, FOLATE, FERRITIN, TIBC, IRON, RETICCTPCT in the last 72 hours. Urinalysis    Component Value Date/Time   COLORURINE YELLOW 11/02/2019 1045   APPEARANCEUR CLEAR 11/02/2019 1045   LABSPEC 1.008 11/02/2019 1045   PHURINE 5.0 11/02/2019 1045  GLUCOSEU NEGATIVE 11/02/2019 [place] 11/02/2019 [place] 11/02/2019 [place] 11/02/2019 [place] 11/02/2019 1045   UROBILINOGEN 0.2 09/18/2009 1810   NITRITE NEGATIVE 11/02/2019 1045   LEUKOCYTESUR LARGE (A) 11/02/2019 1045   Sepsis Labs Invalid input(s): PROCALCITONIN,  WBC,  LACTICIDVEN Microbiology Recent Results (from the past 240 hour(s))  Urine culture     Status: Abnormal   Collection Time: 11/02/19 10:47 AM   Specimen: Urine, Random  Result Value Ref Range Status   Specimen Description   Final    URINE, RANDOM Performed at [facility], [place]  [address], [place], [place] [address]    Special Requests   Final    NONE Performed at [facility], [place] [address], [place], [place] [address]    Culture 60,000 COLONIES/mL ESCHERICHIA COLI (A)  Final   Report Status 11/05/2019 FINAL  Final   Organism ID, Bacteria ESCHERICHIA COLI (A)  Final      Susceptibility   Escherichia coli - MIC*    AMPICILLIN >=32 RESISTANT Resistant     CEFAZOLIN 16 SENSITIVE Sensitive     CEFTRIAXONE <=0.25 SENSITIVE Sensitive     CIPROFLOXACIN <=0.25 SENSITIVE Sensitive     GENTAMICIN <=1 SENSITIVE Sensitive     IMIPENEM <=0.25 SENSITIVE Sensitive     NITROFURANTOIN 32 SENSITIVE Sensitive     TRIMETH/SULFA >=320 RESISTANT Resistant     AMPICILLIN/SULBACTAM >=32 RESISTANT Resistant     PIP/TAZO <=4 SENSITIVE Sensitive     * 60,000 COLONIES/mL ESCHERICHIA COLI  Blood culture (routine x 2)     Status: None   Collection Time: 11/02/19  2:38 PM   Specimen: BLOOD  Result Value Ref Range Status   Specimen Description   Final    BLOOD RIGHT ANTECUBITAL Performed at [facility], [place] [address], [place], [place] [address]    Special Requests   Final    BOTTLES DRAWN AEROBIC ONLY Blood Culture adequate volume Performed at [place] [address], [place], [place] [address]    Culture   Final    NO GROWTH 5 DAYS Performed at [facility], [place] [address] [place], [place] [address]    Report Status 11/07/2019 FINAL  Final  SARS Coronavirus 2 by RT PCR (hospital order, performed in [facility] hospital lab) Nasopharyngeal Nasopharyngeal Swab     Status: None   Collection Time: 11/02/19  2:40 PM   Specimen: Nasopharyngeal Swab  Result Value Ref Range Status   SARS Coronavirus 2 NEGATIVE NEGATIVE Final    Comment: (NOTE) SARS-CoV-2 target nucleic acids are NOT DETECTED.  The SARS-CoV-2 RNA is generally detectable in upper and loweormation.  Fact Sheet for Patients:    StrictlyIdeas.no  Fact Sheet for Healthcare Providers: https:/Performed at [facility], [place] [address], [place], [place] [address]   MRSA PCR Screening     Status: None   Collection Time: 11/04/19  2:02 PM   Specimen: Nasal Mucosa; Nasopharyngeal  Result Value Ref Range Status   MRSA by PCR NEGATIVE NEGATIVE Final    Comment:        The GeneXpert MRSA Assay (FDA approved for NASAL specimens only), is one component of a comprehensive MRSA colonization surveillance program. It is not intended to diagnose MRSA infection nor to guide or monitor treatment for MRSA infections. Performed at [facility], [place] [address] [place], [place] [address]  SARS Coronavirus 2 by RT PCR (hospital order, performed in [facility] hospital lab) Nasopharyngeal Nasopharyngeal Swab     Status: None   Collection Time: 11/09/19 12:15 PM   Specimen: Nasopharyngeal Swab  Result Value Ref Range Status   SARS Coronavirus 2 NEGATIVE NEGATIVE Final    Comment: (NOTE) SARS-CoV-2 target nucleic acids are NOT DETECTED.  The SARS-CoV-2 RNA is genern collection / handling, submission of specimen other than nasopharyngeal swab, presence of viral mutation(s) within the areas targeted by this assay, and inadequate number of viral copies (<250 copies / mL). A negative result must be combined with clinicalPerformed at [facility], [place] [address] [place], [place] [address]      Time coordinating discharge: 55 minutes.  SIGNED:   Deatra James, MD  [facility] 11/12/2019, 10:14 AM

## 2019-11-12 NOTE — TOC Transition Note (Signed)
Transition of Care [facility]) - CM/SW Discharge Note   Patient Details  Name: Bianca [name] MRN: [id number] Date of Birth: 1923/02/11  [facility] CM/SW Contact:  Dessa Phi, RN Phone Number: 11/12/2019, 10:06 AM   Clinical Narrative: Received [facility];TC [facility] SN[facility]-rep Katie-aware of d/c today going to rm 44;[facility]nsg to call report to nurse for [facility]-tel#[phone]. [facility] for transport once ready. No further CM needs.      Final next level of care: Skilled Nursing Facility Barriers to Discharge: No Barriers Identified   Patient Goals and CMS Choice Patient states their goals for this hospitalization and ongoing recovery are:: return back to [facility] CMS Medicare.gov Compare Post Acute Care list provided to:: Patient Represenative (must comment) Trellis Paganini) Choice offered to / list presented to : Adult Children (Nephew)  Discharge Placement PASRR number recieved: 11/12/19            Patient chooses bed at: [facility] Patient to be transferred to facility by: [place] Name of family member notified: Liliane Channel nephew [phone] Patient and family notified of of transfer: 11/12/19  Discharge Plan and Services   Discharge Planning Services: CM Consult Post Acute Care Choice:  (ALF)                               Social Determinants of Health (SDOH) Interventions     Readmission Risk Interventions No flowsheet data found.

## 2019-11-13 ENCOUNTER — Non-Acute Institutional Stay (SKILLED_NURSING_FACILITY): Payer: Medicare Other | Admitting: Internal Medicine

## 2019-11-13 ENCOUNTER — Encounter: Payer: Self-pay | Admitting: Internal Medicine

## 2019-11-13 DIAGNOSIS — R404 Transient alteration of awareness: Secondary | ICD-10-CM | POA: Diagnosis not present

## 2019-11-13 DIAGNOSIS — F339 Major depressive disorder, recurrent, unspecified: Secondary | ICD-10-CM

## 2019-11-13 DIAGNOSIS — R609 Edema, unspecified: Secondary | ICD-10-CM | POA: Diagnosis not present

## 2019-11-13 DIAGNOSIS — K529 Noninfective gastroenteritis and colitis, unspecified: Secondary | ICD-10-CM

## 2019-11-13 DIAGNOSIS — I63432 Cerebral infarction due to embolism of left posterior cerebral artery: Secondary | ICD-10-CM | POA: Diagnosis not present

## 2019-11-13 DIAGNOSIS — I471 Supraventricular tachycardia: Secondary | ICD-10-CM

## 2019-11-13 NOTE — Progress Notes (Signed)
Provider:  Veleta Miners MD Location:   [place] Room Number: 52 Place of Service:  SNF (31)  PCP: [name], [name], NP Patient Care Team: [name], [name], NP as PCP - General (Internal Medicine)  Extended Emergency Contact Information Primary Emergency Contact: [name] Mobile Phone: [phone] Relation: Friend Secondary Emergency Contact: [name] Mobile Phone: [phone] Relation: Nephew  Code Status: DNR Goals of Care: Advanced Directive information Advanced Directives 11/13/2019  Does Patient Have a Medical Advance Directive? Yes  Type of Advance Directive Living will  Does patient want to make changes to medical advance directive? No - Patient declined  Copy of [place] in Chart? -  Pre-existing out of facility DNR order (yellow form or pink MOST form) -      Chief Complaint  Patient presents with  . New Admit To SNF    Admission    HPI: Patient is a 86 y.o. female seen today for admission to SNF for therapy   Stayed in the Hospital from 7/26-8/4 for Acute Infarct with Acute Encephalopathy  Patient has a history of her extremity edema, PSVT has refused to see cardiology before, GERD, IBS with diarrhea She has been living in assisted living for past 1 year She was sent to the hospital for acute change in mental status Her initial CT was negative but the MRI of the brain showed cerebral small acute infarcts in the posterior left hemisphere in the left PCA and MCA watershed area. 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 Due to her tachybradycardia syndrome cardiology was consulted which recommended amiodarone and also started her on Eliquis. During that time patient also told the nurses that she was ready to die.  Was seen by psychiatry.  They recommended palliative care. She was started  on SSRI Patient is now in nursing facility.  She is working with therapy.  Doing very well already walking with a walker.  She did not have any complaints today.  She states that she cannot remember much what happened in the hospital.  Wants to go back to assisted living    Past Medical History:  Diagnosis Date  . COVID-19   . 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 No pertinent surgical history.  reports that she has never smoked. She has never used smokeless tobacco. She reports that she does not drink alcohol and does not use drugs. Social History   Socioeconomic History  . Marital status: Widowed    Spouse name: Not on file  . Number of children: Not on file  . Years of education: Not on file  . Highest education level: Not on file  Occupational History  . Not on file  Tobacco Use  . Smoking status: Never Smoker  . Smokeless tobacco: Never Used  Vaping Use  . Vaping Use: Never used  Substance and Sexual Activity  . Alcohol use: Never  . Drug use: Never  . Sexual activity: Not on file  Other Topics Concern  . Not on file  Social History Narrative  . Not on file   Social Determinants of Health   Financial Resource Strain:   . Difficulty of Paying Living Expenses:   Food Insecurity:   . Worried About Charity fundraiser in the Last Year:   . [employer]  of Food in the Last Year:   Transportation Needs:   . Film/video editor (Medical):   [place] Kitchen Lack of Transportation (Non-Medical):   Physical Activity:   . Days of Exercise per Week:   . Minutes of Exercise per Session:   Stress:   . Feeling of Stress :   Social Connections:   . Frequency of Communication with Friends and Family:   . Frequency of Social Gatherings with Friends and Family:   . Attends Religious Services:   . Active Member of Clubs or Organizations:   . Attends Archivist Meetings:   Mar[place] Kitchen Marital Status:   Intimate  Partner Violence:   . Fear of Current or Ex-Partner:   . Emotionally Abused:   [place] Physically Abused:   . Sexually Abused:     Functional Status Survey:    History reviewed. No pertinent family history.  Health Maintenance  Topic Date Due  . TETANUS/TDAP  Never done  . PNA vac Low Risk Adult (1 of 2 - PCV13) Never done  . COVID-19 Vaccine (2 - Moderna 2-dose series) 07/04/2019  . INFLUENZA VACCINE  11/08/2019  . DEXA SCAN  Completed    Allergies  Allergen Reactions  . Azopt [Brinzolamide] Other (See Comments)    UNK  . Brimonidine Other (See Comments)    UNK  . Lumigan [Bimatoprost] Other (See Comments)    UNK    Allergies as of 11/13/2019      Reactions   Azopt [brinzolamide] Other (See Comments)   UNK   Brimonidine Other (See Comments)   UNK   Lumigan [bimatoprost] Other (See Comments)   UNK      Medication List       Accurate as of November 13, 2019  3:08 PM. If you have any questions, ask your nurse or doctor.        STOP taking these medications   diphenoxylate-atropine 2.5-0.025 MG/5ML liquid Commonly known as: LOMOTIL Stopped by: Virgie Dad, MD     TAKE these medications   acetaminophen 325 MG tablet Commonly known as: TYLENOL Take 650 mg by mouth every 4 (four) hours as needed for mild pain or headache.   amiodarone 100 MG tablet Commonly known as: PACERONE Take 1 tablet (100 mg total) by mouth daily.   apixaban 2.5 MG Tabs tablet Commonly known as: ELIQUIS Take 1 tablet (2.5 mg total) by mouth 2 (two) times daily.   atorvastatin 20 MG tablet Commonly known as: LIPITOR Take 1 tablet (20 mg total) by mouth daily at 6 PM.   b complex vitamins tablet Take 1 tablet by mouth daily.   bismuth subsalicylate 062 IR/48NI suspension Commonly known as: PEPTO BISMOL Take 60 mLs by mouth daily as needed for indigestion or diarrhea or loose stools.   Calcium Carbonate-Vitamin D 600-400 MG-UNIT tablet Take 1 tablet by mouth daily.   CENTRUM  SILVER PO Take 1 tablet by mouth daily. 7am-10am.   melatonin 3 MG Tabs tablet Take 3 mg by mouth at bedtime.   memantine 5 MG tablet Commonly known as: NAMENDA Take 5 mg by mouth daily.   metoprolol succinate 25 MG 24 hr tablet Commonly known as: TOPROL-XL Take 1 tablet (25 mg total) by mouth daily.   NON FORMULARY Moisturizing Cream to extremities with morning and evening care Every Shift   NON FORMULARY Regular Special Instructions: Regular Diet, Thin Liquids   omeprazole 20 MG capsule Commonly known as: PRILOSEC Take 20 mg by mouth daily.  potassium chloride SA 20 MEQ tablet Commonly known as: KLOR-CON Take 20 mEq by mouth daily.   sertraline 25 MG tablet Commonly known as: ZOLOFT Take 0.5 tablets (12.5 mg total) by mouth at bedtime.   timolol 0.5 % ophthalmic solution Commonly known as: BETIMOL Place 1 drop into both eyes daily. 7am-10am.   torsemide 20 MG tablet Commonly known as: DEMADEX Take 20 mg by mouth daily.       Review of Systems  Constitutional: Negative.   HENT: Negative.   Respiratory: Negative.   Cardiovascular: Negative.   Gastrointestinal: Negative.   Genitourinary: Negative.   Musculoskeletal: Negative.   Skin: Negative.   Neurological: Positive for weakness.  Psychiatric/Behavioral: Positive for confusion and dysphoric mood.    Vitals:   11/13/19 1450  BP: 112/64  Pulse: 60  Temp: (!) 97.3 F (36.3 C)  SpO2: 97%  Weight: 79 lb 8 oz (36.1 kg)  Height: 5' (1.524 m)   Body mass index is 15.53 kg/m. Physical Exam  Constitutional: Oriented to person, place, and time. Well-developed and well-nourished.  HENT:  Head: Normocephalic.  Mouth/Throat: Oropharynx is clear and moist.  Eyes: Pupils are equal, round, and reactive to light.  Neck: Neck supple.  Cardiovascular: Normal rate and normal heart sounds.  No murmur heard. Pulmonary/Chest: Effort normal and breath sounds normal. No respiratory distress. No wheezes. She has  no rales.  Abdominal: Soft. Bowel sounds are normal. No distension. There is no tenderness. There is no rebound.  Musculoskeletal: No edema.  Lymphadenopathy: none Neurological: Alert and oriented to person, place, and time.  Skin: Skin is warm and dry.  Psychiatric: Normal mood and affect. Behavior is normal. Thought content normal.    Labs reviewed: Basic Metabolic Panel: Recent Labs    11/03/19 0454 11/04/19 0535 11/06/19 0544  NA 143 141 138  K 4.1 3.6 3.9  CL 103 107 107  CO2 22 24 23   GLUCOSE 76 92 93  BUN 32* 25* 9  CREATININE 1.02* 0.86 0.72  CALCIUM 8.5* 8.2* 8.1*  MG 2.3  --   --    Liver Function Tests: Recent Labs    11/02/19 1045 11/03/19 0454 11/06/19 0544  AST 34 32 34  ALT 19 18 18   ALKPHOS 64 57 45  BILITOT 0.8 0.9 0.3  PROT 6.9 6.1* 5.1*  ALBUMIN 4.0 3.5 2.9*   No results for input(s): LIPASE, AMYLASE in the last 8760 hours. Recent Labs    11/02/19 1045  AMMONIA 11   CBC: Recent Labs    04/26/19 0414 06/16/19 0000 06/16/19 0000 07/24/19 0000 07/24/19 0000 10/26/19 0000 11/02/19 1045 11/03/19 0454 11/06/19 0544  WBC   < > 6.1   < > 9.5   < > 7.8 8.6 6.8 4.3  NEUTROABS  --  3,892  --  7,334  --  5,936  --   --   --   HGB   < > 12.7   < > 12.8   < > 13.9 15.6* 14.9 12.8  HCT   < > 39   < > 39   < > 42 48.9* 47.4* 41.1  MCV   < >  --   --   --   --   --  94.6 96.0 97.4  PLT   < > 257   < > 232   < > 251 250 230 206   < > = values in this interval not displayed.   Cardiac Enzymes: No results for input(s): CKTOTAL,  CKMB, CKMBINDEX, TROPONINI in the last 8760 hours. BNP: Invalid input(s): POCBNP Lab Results  Component Value Date   HGBA1C 5.9 (H) 11/04/2019   Lab Results  Component Value Date   TSH 1.629 11/02/2019   Lab Results  Component Value Date   VITAMINB12 1,009 (H) 11/03/2019   No results found for: FOLATE No results found for: IRON, TIBC, FERRITIN  Imaging and Procedures obtained prior to SNF admission: DG Chest 2  View  Result Date: 11/02/2019 CLINICAL DATA:  Altered mental status EXAM: CHEST - 2 VIEW COMPARISON:  05/30/2019 FINDINGS: The heart size and mediastinal contours are stable. Mildly hyperexpanded lungs without focal airspace consolidation, pleural effusion, or pneumothorax. The visualized skeletal structures are unremarkable. IMPRESSION: No active cardiopulmonary disease. Electronically Signed   By: Davina Poke D.O.   On: 11/02/2019 11:42   CT Head Wo Contrast  Result Date: 11/02/2019 CLINICAL DATA:  Mental status change EXAM: CT HEAD WITHOUT CONTRAST TECHNIQUE: Contiguous axial images were obtained from the base of the skull through the vertex without intravenous contrast. COMPARISON:  CT head 08/07/2005 FINDINGS: Brain: Moderate atrophy with progression since the prior study. Atrophy most prominent in the frontal lobes. There is ventricular enlargement due to atrophy which has progressed. Periventricular white matter hypodensity bilaterally also has progressed. Negative for acute infarct, hemorrhage, mass. Vascular: Negative for hyperdense vessel Skull: Negative Sinuses/Orbits: Paranasal sinuses clear. Bilateral cataract extraction. Other: None IMPRESSION: No acute abnormality Progression of atrophy and chronic microvascular ischemic change in the white matter compared with 2007. Electronically Signed   By: Franchot Gallo M.D.   On: 11/02/2019 11:32    Assessment/Plan  Cerebrovascular accident (CVA) due to embolism of left posterior cerebral artery and Left MCA Embolic Most Likely On ELiquis now Also oN Lipitor Doing well with therapy Most likely would be discharged to AL  Paroxysmal SVT (supraventricular tachycardia) (HCC) On Amiodarone And on Eliquis Will follow with Cardiology Metoprolol Discontinued Edema, unspecified type On Low dose of Demadex and Potassium  Chronic diarrhea with IBS On Lomotil PRN Transient alteration of awareness Due to Stroke and UTI Discontinue Namenda  for now Will consider starting if she needs later Depression, recurrent ([place]) Was eval by Psych in the hospital On Zoloft now Palliative care on board   Family/ staff Communication:   Labs/tests ordered: CMP and CBC  Total time spent in this patient care encounter was  45_  minutes; greater than 50% of the visit spent counseling patient and staff, reviewing records , Labs and coordinating care for problems addressed at this encounter.

## 2019-11-17 ENCOUNTER — Non-Acute Institutional Stay (SKILLED_NURSING_FACILITY): Payer: Medicare Other | Admitting: Nurse Practitioner

## 2019-11-17 ENCOUNTER — Encounter: Payer: Self-pay | Admitting: Nurse Practitioner

## 2019-11-17 DIAGNOSIS — K219 Gastro-esophageal reflux disease without esophagitis: Secondary | ICD-10-CM

## 2019-11-17 DIAGNOSIS — R2681 Unsteadiness on feet: Secondary | ICD-10-CM | POA: Diagnosis not present

## 2019-11-17 DIAGNOSIS — N3946 Mixed incontinence: Secondary | ICD-10-CM | POA: Diagnosis not present

## 2019-11-17 DIAGNOSIS — I471 Supraventricular tachycardia: Secondary | ICD-10-CM

## 2019-11-17 DIAGNOSIS — M6281 Muscle weakness (generalized): Secondary | ICD-10-CM | POA: Diagnosis not present

## 2019-11-17 DIAGNOSIS — F4323 Adjustment disorder with mixed anxiety and depressed mood: Secondary | ICD-10-CM | POA: Diagnosis not present

## 2019-11-17 DIAGNOSIS — R1312 Dysphagia, oropharyngeal phase: Secondary | ICD-10-CM | POA: Diagnosis not present

## 2019-11-17 DIAGNOSIS — R41841 Cognitive communication deficit: Secondary | ICD-10-CM | POA: Diagnosis not present

## 2019-11-17 DIAGNOSIS — R609 Edema, unspecified: Secondary | ICD-10-CM

## 2019-11-17 DIAGNOSIS — I639 Cerebral infarction, unspecified: Secondary | ICD-10-CM | POA: Diagnosis not present

## 2019-11-17 DIAGNOSIS — R1310 Dysphagia, unspecified: Secondary | ICD-10-CM | POA: Diagnosis not present

## 2019-11-17 NOTE — Progress Notes (Signed)
Location:   [facility] Room Number: 44 Place of Service:  SNF (31) Provider: Marlana Latus NP  Tobey Schmelzle X, NP  Patient Care Team: Quante Pettry X, NP as PCP - General (Internal Medicine)  Extended Emergency Contact Information Primary Emergency Contact: [name] Mobile Phone: [phone] Relation: Friend Secondary Emergency Contact: [name] Mobile Phone: [phone] Relation: Nephew  Code Status: DNR Goals of care: Advanced Directive information Advanced Directives 11/13/2019  Does Patient Have a Medical Advance Directive? Yes  Type of Advance Directive Living will  Does patient want to make changes to medical advance directive? No - Patient declined  Copy of [place] in Chart? -  Pre-existing out of facility DNR order (yellow form or pink MOST form) -     Chief Complaint  Patient presents with  . Acute Visit    f/u Hospital stay    HPI:  [name] seen today for an acute visit for review medications  Hospital stay 11/02/19-11/11/19 for acute CVA (MRI cevebral small acute infarcts in the posterior left hemisphere in the left PCA and MCA watershed area. No significant carotid stenosis, Echo EF 82-99%), embolic etiology, started on Amiodarone, Eliquis. Her encephalopathy has been resolved, in SNF [facility] for therapy, she has regained her physical strength and ADL function, will return to [place] [facility] where she resides prior to hospitalization  Edema, takes Torsemide.   PSVT takes Amiodarone, Eliquis.   GERD, takes Omeprazole.   Depression, takes Sertraline.    Past Medical History:  Diagnosis Date  . COVID-19   . 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 No pertinent surgical history.  Allergies  Allergen Reactions  . Azopt [Brinzolamide] Other (See Comments)    UNK  . Brimonidine Other (See Comments)    UNK  . Lumigan [Bimatoprost]  Other (See Comments)    UNK    Allergies as of 11/17/2019      Reactions   Azopt [brinzolamide] Other (See Comments)   UNK   Brimonidine Other (See Comments)   UNK   Lumigan [bimatoprost] Other (See Comments)   UNK      Medication List       Accurate as of November 17, 2019 11:59 PM. If you have any questions, ask your nurse or doctor.        acetaminophen 325 MG tablet Commonly known as: TYLENOL Take 650 mg by mouth every 4 (four) hours as needed for mild pain or headache.   amiodarone 100 MG tablet Commonly known as: PACERONE Take 1 tablet (100 mg total) by mouth daily.   apixaban 2.5 MG Tabs tablet Commonly known as: ELIQUIS Take 1 tablet (2.5 mg total) by mouth 2 (two) times daily.   atorvastatin 20 MG tablet Commonly known as: LIPITOR Take 1 tablet (20 mg total) by mouth daily at 6 PM.   b complex vitamins tablet Take 1 tablet by mouth daily.   bismuth subsalicylate 371 IR/67EL suspension Commonly known as: PEPTO BISMOL Take 60 mLs by mouth daily as needed for indigestion or diarrhea or loose stools.   Calcium Carbonate-Vitamin D 600-400 MG-UNIT tablet Take 1 tablet by mouth daily.   CENTRUM SILVER PO Take 1 tablet by mouth daily. 7am-10am.   melatonin 3 MG Tabs tablet Take 3 mg by mouth at bedtime.   NON FORMULARY Moisturizing Cream to extremities with morning and evening care Every Shift   NON  FORMULARY Regular Special Instructions: Regular Diet, Thin Liquids   omeprazole 20 MG capsule Commonly known as: PRILOSEC Take 20 mg by mouth daily.   potassium chloride SA 20 MEQ tablet Commonly known as: KLOR-CON Take 20 mEq by mouth daily.   sertraline 25 MG tablet Commonly known as: ZOLOFT Take 0.5 tablets (12.5 mg total) by mouth at bedtime.   timolol 0.5 % ophthalmic solution Commonly known as: BETIMOL Place 1 drop into both eyes daily. 7am-10am.   torsemide 20 MG tablet Commonly known as: DEMADEX Take 20 mg by mouth daily.        Review of Systems  Constitutional: Negative for appetite change, fatigue and fever.  HENT: Positive for hearing loss and trouble swallowing. Negative for congestion and voice change.   Eyes: Negative for visual disturbance.  Respiratory: Negative for cough and shortness of breath.   Cardiovascular: Positive for leg swelling. Negative for palpitations.       RLE  Gastrointestinal: Negative for abdominal pain and constipation.  Genitourinary: Positive for frequency. Negative for difficulty urinating, dysuria and urgency.  Musculoskeletal: Positive for gait problem.  Skin: Negative for color change.  Neurological: Negative for speech difficulty, weakness, light-headedness and headaches.  Psychiatric/Behavioral: Positive for confusion. Negative for behavioral problems and sleep disturbance.    Immunization History  Administered Date(s) Administered  . Influenza, High Dose Seasonal PF 11/08/2018  . Influenza-Unspecified 11/07/2013  . Moderna SARS-COVID-2 Vaccination 06/06/2019   Pertinent  Health Maintenance Due  Topic Date Due  . PNA vac Low Risk Adult (1 of 2 - PCV13) Never done  . INFLUENZA VACCINE  11/08/2019  . DEXA SCAN  Completed   Fall Risk  05/19/2019 05/14/2019  Falls in the past year? 0 0  Number falls in past yr: 0 0  Injury with Fall? 0 -   Functional Status Survey:    Vitals:   11/17/19 1254  BP: 110/60  Pulse: 82  Resp: 20  Temp: 98 F (36.7 C)  SpO2: 96%  Weight: 79 lb 8 oz (36.1 kg)  Height: 5' (1.524 m)   Body mass index is 15.53 kg/m. Physical Exam Vitals and nursing note reviewed.  Constitutional:      General: She is not in acute distress.    Appearance: Normal appearance. She is not ill-appearing.  HENT:     Head: Normocephalic and atraumatic.     Mouth/Throat:     Mouth: Mucous membranes are moist.  Eyes:     Extraocular Movements: Extraocular movements intact.     Conjunctiva/sclera: Conjunctivae normal.     Pupils: Pupils are equal,  round, and reactive to light.  Cardiovascular:     Rate and Rhythm: Normal rate and regular rhythm.     Heart sounds: No murmur heard.   Pulmonary:     Breath sounds: No wheezing or rales.  Abdominal:     General: Bowel sounds are normal.     Palpations: Abdomen is soft.     Tenderness: There is no abdominal tenderness. There is no right CVA tenderness, left CVA tenderness, guarding or rebound.  Musculoskeletal:     Cervical back: Normal range of motion and neck supple.     Right lower leg: No edema.     Left lower leg: No edema.  Skin:    General: Skin is warm and dry.  Neurological:     General: No focal deficit present.     Mental Status: She is alert and oriented to person, place, and time. Mental status  is at baseline.     Motor: No weakness.     Coordination: Coordination normal.     Gait: Gait abnormal.  Psychiatric:     Comments: Keeps saying I loss my memory.      Labs reviewed: Recent Labs    11/03/19 0454 11/04/19 0535 11/06/19 0544  NA 143 141 138  K 4.1 3.6 3.9  CL 103 107 107  CO2 22 24 23   GLUCOSE 76 92 93  BUN 32* 25* 9  CREATININE 1.02* 0.86 0.72  CALCIUM 8.5* 8.2* 8.1*  MG 2.3  --   --    Recent Labs    11/02/19 1045 11/03/19 0454 11/06/19 0544  AST 34 32 34  ALT 19 18 18   ALKPHOS 64 57 45  BILITOT 0.8 0.9 0.3  PROT 6.9 6.1* 5.1*  ALBUMIN 4.0 3.5 2.9*   Recent Labs    04/26/19 0414 06/16/19 0000 06/16/19 0000 07/24/19 0000 07/24/19 0000 10/26/19 0000 11/02/19 1045 11/03/19 0454 11/06/19 0544  WBC   < > 6.1   < > 9.5   < > 7.8 8.6 6.8 4.3  NEUTROABS  --  6,195  --  7,334  --  5,936  --   --   --   HGB   < > 12.7   < > 12.8   < > 13.9 15.6* 14.9 12.8  HCT   < > 39   < > 39   < > 42 48.9* 47.4* 41.1  MCV   < >  --   --   --   --   --  94.6 96.0 97.4  PLT   < > 257   < > 232   < > 251 250 230 206   < > = values in this interval not displayed.   Lab Results  Component Value Date   TSH 1.629 11/02/2019   Lab Results   Component Value Date   HGBA1C 5.9 (H) 11/04/2019   Lab Results  Component Value Date   CHOL 138 11/06/2019   HDL 49 11/06/2019   LDLCALC 72 11/06/2019   TRIG 86 11/06/2019   CHOLHDL 2.8 11/06/2019    Significant Diagnostic Results in last 30 days:  DG Chest 2 View  Result Date: 11/02/2019 CLINICAL DATA:  Altered mental status EXAM: CHEST - 2 VIEW COMPARISON:  05/30/2019 FINDINGS: The heart size and mediastinal contours are stable. Mildly hyperexpanded lungs without focal airspace consolidation, pleural effusion, or pneumothorax. The visualized skeletal structures are unremarkable. IMPRESSION: No active cardiopulmonary disease. Electronically Signed   By: Davina Poke D.O.   On: 11/02/2019 11:42   CT Head Wo Contrast  Result Date: 11/02/2019 CLINICAL DATA:  Mental status change EXAM: CT HEAD WITHOUT CONTRAST TECHNIQUE: Contiguous axial images were obtained from the base of the skull through the vertex without intravenous contrast. COMPARISON:  CT head 08/07/2005 FINDINGS: Brain: Moderate atrophy with progression since the prior study. Atrophy most prominent in the frontal lobes. There is ventricular enlargement due to atrophy which has progressed. Periventricular white matter hypodensity bilaterally also has progressed. Negative for acute infarct, hemorrhage, mass. Vascular: Negative for hyperdense vessel Skull: Negative Sinuses/Orbits: Paranasal sinuses clear. Bilateral cataract extraction. Other: None IMPRESSION: No acute abnormality Progression of atrophy and chronic microvascular ischemic change in the white matter compared with 2007. Electronically Signed   By: Franchot Gallo M.D.   On: 11/02/2019 11:32   MR ANGIO HEAD WO CONTRAST  Result Date: 11/05/2019 CLINICAL DATA:  Initial evaluation for  acute stroke. EXAM: MRA HEAD WITHOUT CONTRAST TECHNIQUE: Angiographic images of the Circle of Willis were obtained using MRA technique without intravenous contrast. COMPARISON:  Prior brain MRI  from 11/04/2019. FINDINGS: ANTERIOR CIRCULATION: Visualized distal cervical segments of the internal carotid arteries are widely patent with symmetric antegrade flow. Petrous, cavernous, and supraclinoid ICAs widely patent without stenosis or other abnormality. Origin of the ophthalmic arteries patent. ICA termini well perfused. A1 segments patent bilaterally. Normal anterior communicating artery complex. Anterior cerebral arteries widely patent to their distal aspects without stenosis. No M1 stenosis or occlusion. Normal MCA bifurcations. Distal MCA branches well perfused and symmetric. POSTERIOR CIRCULATION: Vertebral arteries widely patent to the vertebrobasilar junction without stenosis. Left vertebral artery dominant. Partially visualized posterior inferior cerebral arteries patent bilaterally. Basilar widely patent to its distal aspect without stenosis. Superior cerebral arteries patent bilaterally. Both PCAs primarily supplied via the basilar well perfused to their distal aspects. No intracranial aneurysm or other vascular abnormality. IMPRESSION: Normal intracranial MRA. No large vessel occlusion, hemodynamically significant stenosis, or other vascular abnormality. No aneurysm. Electronically Signed   By: Jeannine Boga M.D.   On: 11/05/2019 19:49   MR BRAIN WO CONTRAST  Result Date: 11/04/2019 CLINICAL DATA:  86 year old female with altered mental status for 2 days. EXAM: MRI HEAD WITHOUT CONTRAST TECHNIQUE: Multiplanar, multiecho pulse sequences of the brain and surrounding structures were obtained without intravenous contrast. COMPARISON:  Head CT 11/02/2019. FINDINGS: Brain: Patchy, scattered restricted diffusion in the posterior left hemisphere seen in the inferior left parietal lobe cortex (series 5, image 81), left periatrial white matter approaching the splenium of the corpus callosum (image 77), and occipital lobe white matter (image 70). Associated mild T2 and FLAIR hyperintensity in  these areas with no evidence of hemorrhage. No mass effect. No other restricted diffusion. Patchy bilateral periventricular white matter T2 and FLAIR hyperintensity with some areas most resembling chronic white matter lacunar infarcts. But no chronic cortical encephalomalacia identified. No chronic cerebral blood products. No midline shift, mass effect, evidence of mass lesion, ventriculomegaly, extra-axial collection or acute intracranial hemorrhage. Cervicomedullary junction and pituitary are within normal limits. Vascular: Major intracranial vascular flow voids are preserved. Skull and upper cervical spine: Partially visible cervical spine degeneration including ligamentous hypertrophy about the odontoid. Visualized bone marrow signal is within normal limits. Sinuses/Orbits: Postoperative changes to both globes. No acute findings. Other: Mild left mastoid effusion.  Visible pharynx appears normal. IMPRESSION: 1. 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 No associated hemorrhage or mass effect. 2. Underlying chronic white matter signal changes most likely due to small vessel disease. 3. Mild left mastoid effusion, likely postinflammatory. Electronically Signed   By: Genevie Ann M.D.   On: 11/04/2019 20:53   DG Foot 2 Views Right  Result Date: 11/08/2019 CLINICAL DATA:  Plantar foot pain cross the ball of the foot at the first through third metatarsal-phalangeal joint region. EXAM: RIGHT FOOT - 2 VIEW COMPARISON:  None. FINDINGS: Mild pes cavus and hammertoe deformity of the third and fourth phalanges. Trace osteoarthritis of the first metatarsal phalangeal joint. No erosion, periosteal reaction, or bony destruction. No plantar calcaneal spur. No focal soft tissue abnormality. IMPRESSION: 1. Mild osteoarthritis of the first metatarsophalangeal joint. 2. Pes cavus and hammertoe deformity of the third and fourth phalanges. Electronically  Signed   By: Keith Rake M.D.   On: 11/08/2019 15:09   ECHOCARDIOGRAM COMPLETE  Result Date: 11/05/2019    ECHOCARDIOGRAM  REPORT   Patient Name:   Bianca [name] Date of Exam: 11/05/2019 Medical Rec #:  [id number]        Height:       60.0 in Accession #:    [id number]       Weight:       80.2 lb Date of Birth:  May 03, 1922         BSA:          1.266 m Patient Age:    89 years         BP:           117/54 mmHg Patient Gender: F                HR:           52 bpm. Exam Location:  Inpatient Procedure: 2D Echo, Cardiac Doppler and Color Doppler Indications:    COVID-19  History:        Patient has no prior history of Echocardiogram examinations.  Sonographer:    Jonelle Sidle Dance Referring Phys: Lewie Chamber AKULA IMPRESSIONS  1. Left ventricular ejection fraction, by estimation, is 60 to 65%. The left ventricle has normal function. The left ventricle has no regional wall motion abnormalities. There is mild left ventricular hypertrophy. Left ventricular diastolic parameters are consistent with Grade II diastolic dysfunction (pseudonormalization). Elevated left atrial pressure.  2. Right ventricular systolic function is normal. The right ventricular size is normal. There is mildly elevated pulmonary artery systolic pressure. The estimated right ventricular systolic pressure is 82.9 mmHg.  3. Right atrial size was mildly dilated.  4. The mitral valve is normal in structure. Trivial mitral valve regurgitation.  5. Tricuspid valve regurgitation is moderate to severe.  6. The aortic valve is tricuspid. Aortic valve regurgitation is mild. Mild aortic valve sclerosis is present, with no evidence of aortic valve stenosis.  7. The inferior vena cava is normal in size with greater than 50% respiratory variability, suggesting right atrial pressure of 3 mmHg. FINDINGS  Left Ventricle: Left ventricular ejection fraction, by estimation, is 60 to 65%. The left ventricle has normal function. The left ventricle has no regional wall  motion abnormalities. The left ventricular internal cavity size was small. There is mild left ventricular hypertrophy. Left ventricular diastolic parameters are consistent with Grade II diastolic dysfunction (pseudonormalization). Elevated left atrial pressure. Right Ventricle: The right ventricular size is normal. Right vetricular wall thickness was not assessed. Right ventricular systolic function is normal. There is mildly elevated pulmonary artery systolic pressure. The tricuspid regurgitant velocity is 2.97 m/s, and with an assumed right atrial pressure of 3 mmHg, the estimated right ventricular systolic pressure is 56.2 mmHg. Left Atrium: Left atrial size was normal in size. Right Atrium: Right atrial size was mildly dilated. Pericardium: Trivial pericardial effusion is present. Mitral Valve: The mitral valve is normal in structure. Trivial mitral valve regurgitation. Tricuspid Valve: The tricuspid valve is normal in structure. Tricuspid valve regurgitation is moderate to severe. Aortic Valve: The aortic valve is tricuspid. Aortic valve regurgitation is mild. Aortic regurgitation PHT measures 558 msec. Mild aortic valve sclerosis is present, with no evidence of aortic valve stenosis. Pulmonic Valve: The pulmonic valve was grossly normal. Pulmonic valve regurgitation is trivial. Aorta: The aortic root and ascending aorta are structurally normal, with no evidence of dilitation. Venous: The inferior vena cava is normal in size with greater than 50% respiratory variability, suggesting right atrial pressure of 3 mmHg. IAS/Shunts: The interatrial septum was not well  visualized.  LEFT VENTRICLE PLAX 2D LVIDd:         3.21 cm LVIDs:         2.01 cm LV PW:         1.15 cm LV IVS:        0.90 cm LVOT diam:     1.70 cm LV SV:         47 LV SV Index:   37 LVOT Area:     2.27 cm  RIGHT VENTRICLE          IVC RV Basal diam:  2.88 cm  IVC diam: 1.49 cm RV Mid diam:    1.94 cm TAPSE (M-mode): 1.8 cm LEFT ATRIUM              Index       RIGHT ATRIUM           Index LA diam:        3.20 cm 2.53 cm/m  RA Area:     13.90 cm LA Vol (A2C):   44.4 ml 35.07 ml/m RA Volume:   34.40 ml  27.17 ml/m LA Vol (A4C):   35.0 ml 27.64 ml/m LA Biplane Vol: 40.9 ml 32.30 ml/m  AORTIC VALVE LVOT Vmax:   94.80 cm/s LVOT Vmean:  63.000 cm/s LVOT VTI:    0.205 m AI PHT:      558 msec  AORTA Ao Root diam: 2.80 cm Ao Asc diam:  2.70 cm MITRAL VALVE                TRICUSPID VALVE MV Area (PHT): 3.21 cm     TR Peak grad:   35.3 mmHg MV Decel Time: 236 msec     TR Vmax:        297.00 cm/s MV E velocity: 102.00 cm/s MV A velocity: 68.30 cm/s   SHUNTS MV E/A ratio:  1.49         Systemic VTI:  0.20 m                             Systemic Diam: 1.70 cm Oswaldo Milian MD Electronically signed by Oswaldo Milian MD Signature Date/Time: 11/05/2019/5:59:08 PM    Final    VAS US CAROTID  Result Date: 11/09/2019 Carotid Arterial Duplex Study Indications:       CVA. Comparison Study:  no prior Performing Technologist: Abram Sander RVS  Examination Guidelines: A complete evaluation includes B-mode imaging, spectral Doppler, color Doppler, and power Doppler as needed of all accessible portions of each vessel. Bilateral testing is considered an integral part of a complete examination. Limited examinations for reoccurring indications may be performed as noted.  Right Carotid Findings: +----------+--------+--------+--------+------------------+--------+           PSV cm/sEDV cm/sStenosisPlaque DescriptionComments +----------+--------+--------+--------+------------------+--------+ CCA Prox  59      12              heterogenous               +----------+--------+--------+--------+------------------+--------+ CCA Distal52      7               heterogenous               +----------+--------+--------+--------+------------------+--------+ ICA Prox  34      10      1-39%   heterogenous                +----------+--------+--------+--------+------------------+--------+ ICA  Distal47      11                                         +----------+--------+--------+--------+------------------+--------+ ECA       55                                                 +----------+--------+--------+--------+------------------+--------+ +----------+--------+-------+--------+-------------------+           PSV cm/sEDV cmsDescribeArm Pressure (mmHG) +----------+--------+-------+--------+-------------------+ [id number]                                         +----------+--------+-------+--------+-------------------+ +---------+--------+--+--------+--+---------+ VertebralPSV cm/s52EDV cm/s14Antegrade +---------+--------+--+--------+--+---------+  Left Carotid Findings: +----------+--------+--------+--------+------------------+--------+           PSV cm/sEDV cm/sStenosisPlaque DescriptionComments +----------+--------+--------+--------+------------------+--------+ CCA Prox  57      14              heterogenous               +----------+--------+--------+--------+------------------+--------+ CCA Distal63      12              heterogenous               +----------+--------+--------+--------+------------------+--------+ ICA Prox  59      13      1-39%   heterogenous      tortuous +----------+--------+--------+--------+------------------+--------+ ICA Distal86      14                                         +----------+--------+--------+--------+------------------+--------+ ECA       77                                                 +----------+--------+--------+--------+------------------+--------+ +----------+--------+--------+--------+-------------------+           PSV cm/sEDV cm/sDescribeArm Pressure (mmHG) +----------+--------+--------+--------+-------------------+ [id number]                                           +----------+--------+--------+--------+-------------------+ +---------+--------+--+--------+-+---------+ VertebralPSV cm/s86EDV cm/s9Antegrade +---------+--------+--+--------+-+---------+   Summary: Right Carotid: Velocities in the right ICA are consistent with a 1-39% stenosis. Left Carotid: Velocities in the left ICA are consistent with a 1-39% stenosis. Vertebrals: Bilateral vertebral arteries demonstrate antegrade flow. *See table(s) above for measurements and observations.  Electronically signed by Antony Contras MD on 11/09/2019 at 1:20:26 PM.    Final     Assessment/Plan: Stroke [facility]) Hospital stay 11/02/19-11/11/19 for acute CVA (MRI cevebral small acute infarcts in the posterior left hemisphere in the left PCA and MCA watershed area. No significant carotid stenosis, Echo EF 21-30%), embolic etiology, started on Amiodarone, Eliquis. Her encephalopathy has been resolved, in SNF [facility] for therapy, she has regained her physical strength and ADL function, will return to [place] [facility] where she resides prior to hospitalization   Paroxysmal SVT (  supraventricular tachycardia) (HCC) Heart rate is controlled, continue Amiodarone, Eliquis.   GERD (gastroesophageal reflux disease) Stable, continue Omeprazole.   Adjustment disorder with mixed anxiety and depressed mood Her mood is stable, continue Sertraline.   Edema Minimal, continue Torsemide.    Family/ staff Communication: plan of care reviewed with the patient and charge nurse.   Labs/tests ordered: none  Time spend 35 minutes.

## 2019-11-17 NOTE — Assessment & Plan Note (Signed)
Her mood is stable, continue Sertraline.  

## 2019-11-17 NOTE — Assessment & Plan Note (Signed)
Minimal, continue Torsemide.

## 2019-11-17 NOTE — Assessment & Plan Note (Signed)
Heart rate is controlled, continue Amiodarone, Eliquis.

## 2019-11-17 NOTE — Assessment & Plan Note (Signed)
Hospital stay 11/02/19-11/11/19 for acute CVA (MRI cevebral small acute infarcts in the posterior left hemisphere in the left PCA and MCA watershed area. No significant carotid stenosis, Echo EF 03-00%), embolic etiology, started on Amiodarone, Eliquis. Her encephalopathy has been resolved, in SNF [facility] for therapy, she has regained her physical strength and ADL function, will return to [place] [facility] where she resides prior to hospitalization

## 2019-11-17 NOTE — Assessment & Plan Note (Signed)
Stable, continue Omeprazole.  

## 2019-11-18 ENCOUNTER — Encounter: Payer: Self-pay | Admitting: Nurse Practitioner

## 2019-11-18 DIAGNOSIS — R1310 Dysphagia, unspecified: Secondary | ICD-10-CM | POA: Diagnosis not present

## 2019-11-18 DIAGNOSIS — N3946 Mixed incontinence: Secondary | ICD-10-CM | POA: Diagnosis not present

## 2019-11-18 DIAGNOSIS — R1312 Dysphagia, oropharyngeal phase: Secondary | ICD-10-CM | POA: Diagnosis not present

## 2019-11-18 DIAGNOSIS — R2681 Unsteadiness on feet: Secondary | ICD-10-CM | POA: Diagnosis not present

## 2019-11-18 DIAGNOSIS — R41841 Cognitive communication deficit: Secondary | ICD-10-CM | POA: Diagnosis not present

## 2019-11-18 DIAGNOSIS — M6281 Muscle weakness (generalized): Secondary | ICD-10-CM | POA: Diagnosis not present

## 2019-11-19 DIAGNOSIS — R2681 Unsteadiness on feet: Secondary | ICD-10-CM | POA: Diagnosis not present

## 2019-11-19 DIAGNOSIS — R1310 Dysphagia, unspecified: Secondary | ICD-10-CM | POA: Diagnosis not present

## 2019-11-19 DIAGNOSIS — M6281 Muscle weakness (generalized): Secondary | ICD-10-CM | POA: Diagnosis not present

## 2019-11-19 DIAGNOSIS — R1312 Dysphagia, oropharyngeal phase: Secondary | ICD-10-CM | POA: Diagnosis not present

## 2019-11-19 DIAGNOSIS — N3946 Mixed incontinence: Secondary | ICD-10-CM | POA: Diagnosis not present

## 2019-11-19 DIAGNOSIS — R41841 Cognitive communication deficit: Secondary | ICD-10-CM | POA: Diagnosis not present

## 2019-11-19 DIAGNOSIS — I10 Essential (primary) hypertension: Secondary | ICD-10-CM | POA: Diagnosis not present

## 2019-11-19 LAB — CBC AND DIFFERENTIAL
HCT: 42 (ref 36–46)
Hemoglobin: 13.6 (ref 12.0–16.0)
Platelets: 269 (ref 150–399)
WBC: 7.7

## 2019-11-19 LAB — HEPATIC FUNCTION PANEL
ALT: 18 (ref 7–35)
AST: 24 (ref 13–35)
Alkaline Phosphatase: 64 (ref 25–125)
Bilirubin, Total: 0.6

## 2019-11-19 LAB — BASIC METABOLIC PANEL
BUN: 38 — AB (ref 4–21)
CO2: 24 — AB (ref 13–22)
Chloride: 104 (ref 99–108)
Creatinine: 1.2 — AB (ref 0.5–1.1)
Glucose: 88
Potassium: 4.1 (ref 3.4–5.3)
Sodium: 139 (ref 137–147)

## 2019-11-19 LAB — COMPREHENSIVE METABOLIC PANEL
Albumin: 3.7 (ref 3.5–5.0)
Calcium: 8.8 (ref 8.7–10.7)
Globulin: 2.3

## 2019-11-19 LAB — CBC: RBC: 4.44 (ref 3.87–5.11)

## 2019-11-20 DIAGNOSIS — R41841 Cognitive communication deficit: Secondary | ICD-10-CM | POA: Diagnosis not present

## 2019-11-20 DIAGNOSIS — R1312 Dysphagia, oropharyngeal phase: Secondary | ICD-10-CM | POA: Diagnosis not present

## 2019-11-20 DIAGNOSIS — R2681 Unsteadiness on feet: Secondary | ICD-10-CM | POA: Diagnosis not present

## 2019-11-20 DIAGNOSIS — N3946 Mixed incontinence: Secondary | ICD-10-CM | POA: Diagnosis not present

## 2019-11-20 DIAGNOSIS — R1310 Dysphagia, unspecified: Secondary | ICD-10-CM | POA: Diagnosis not present

## 2019-11-20 DIAGNOSIS — M6281 Muscle weakness (generalized): Secondary | ICD-10-CM | POA: Diagnosis not present

## 2019-11-21 DIAGNOSIS — N3946 Mixed incontinence: Secondary | ICD-10-CM | POA: Diagnosis not present

## 2019-11-21 DIAGNOSIS — R2681 Unsteadiness on feet: Secondary | ICD-10-CM | POA: Diagnosis not present

## 2019-11-21 DIAGNOSIS — R1312 Dysphagia, oropharyngeal phase: Secondary | ICD-10-CM | POA: Diagnosis not present

## 2019-11-21 DIAGNOSIS — M6281 Muscle weakness (generalized): Secondary | ICD-10-CM | POA: Diagnosis not present

## 2019-11-21 DIAGNOSIS — R41841 Cognitive communication deficit: Secondary | ICD-10-CM | POA: Diagnosis not present

## 2019-11-21 DIAGNOSIS — R1310 Dysphagia, unspecified: Secondary | ICD-10-CM | POA: Diagnosis not present

## 2019-11-23 DIAGNOSIS — R1312 Dysphagia, oropharyngeal phase: Secondary | ICD-10-CM | POA: Diagnosis not present

## 2019-11-23 DIAGNOSIS — R1310 Dysphagia, unspecified: Secondary | ICD-10-CM | POA: Diagnosis not present

## 2019-11-23 DIAGNOSIS — N3946 Mixed incontinence: Secondary | ICD-10-CM | POA: Diagnosis not present

## 2019-11-23 DIAGNOSIS — M6281 Muscle weakness (generalized): Secondary | ICD-10-CM | POA: Diagnosis not present

## 2019-11-23 DIAGNOSIS — R41841 Cognitive communication deficit: Secondary | ICD-10-CM | POA: Diagnosis not present

## 2019-11-23 DIAGNOSIS — R2681 Unsteadiness on feet: Secondary | ICD-10-CM | POA: Diagnosis not present

## 2019-11-24 DIAGNOSIS — L570 Actinic keratosis: Secondary | ICD-10-CM | POA: Diagnosis not present

## 2019-11-24 DIAGNOSIS — R1312 Dysphagia, oropharyngeal phase: Secondary | ICD-10-CM | POA: Diagnosis not present

## 2019-11-24 DIAGNOSIS — L814 Other melanin hyperpigmentation: Secondary | ICD-10-CM | POA: Diagnosis not present

## 2019-11-24 DIAGNOSIS — L821 Other seborrheic keratosis: Secondary | ICD-10-CM | POA: Diagnosis not present

## 2019-11-24 DIAGNOSIS — R41841 Cognitive communication deficit: Secondary | ICD-10-CM | POA: Diagnosis not present

## 2019-11-24 DIAGNOSIS — M6281 Muscle weakness (generalized): Secondary | ICD-10-CM | POA: Diagnosis not present

## 2019-11-24 DIAGNOSIS — N3946 Mixed incontinence: Secondary | ICD-10-CM | POA: Diagnosis not present

## 2019-11-24 DIAGNOSIS — R1310 Dysphagia, unspecified: Secondary | ICD-10-CM | POA: Diagnosis not present

## 2019-11-24 DIAGNOSIS — L84 Corns and callosities: Secondary | ICD-10-CM | POA: Diagnosis not present

## 2019-11-24 DIAGNOSIS — R2681 Unsteadiness on feet: Secondary | ICD-10-CM | POA: Diagnosis not present

## 2019-11-25 DIAGNOSIS — R41841 Cognitive communication deficit: Secondary | ICD-10-CM | POA: Diagnosis not present

## 2019-11-25 DIAGNOSIS — R1310 Dysphagia, unspecified: Secondary | ICD-10-CM | POA: Diagnosis not present

## 2019-11-25 DIAGNOSIS — M6281 Muscle weakness (generalized): Secondary | ICD-10-CM | POA: Diagnosis not present

## 2019-11-25 DIAGNOSIS — N3946 Mixed incontinence: Secondary | ICD-10-CM | POA: Diagnosis not present

## 2019-11-25 DIAGNOSIS — R1312 Dysphagia, oropharyngeal phase: Secondary | ICD-10-CM | POA: Diagnosis not present

## 2019-11-25 DIAGNOSIS — R2681 Unsteadiness on feet: Secondary | ICD-10-CM | POA: Diagnosis not present

## 2019-11-26 DIAGNOSIS — R41841 Cognitive communication deficit: Secondary | ICD-10-CM | POA: Diagnosis not present

## 2019-11-26 DIAGNOSIS — R1310 Dysphagia, unspecified: Secondary | ICD-10-CM | POA: Diagnosis not present

## 2019-11-26 DIAGNOSIS — R1312 Dysphagia, oropharyngeal phase: Secondary | ICD-10-CM | POA: Diagnosis not present

## 2019-11-26 DIAGNOSIS — M6281 Muscle weakness (generalized): Secondary | ICD-10-CM | POA: Diagnosis not present

## 2019-11-26 DIAGNOSIS — N3946 Mixed incontinence: Secondary | ICD-10-CM | POA: Diagnosis not present

## 2019-11-26 DIAGNOSIS — R2681 Unsteadiness on feet: Secondary | ICD-10-CM | POA: Diagnosis not present

## 2019-11-27 ENCOUNTER — Non-Acute Institutional Stay: Payer: Medicare Other | Admitting: Internal Medicine

## 2019-11-27 ENCOUNTER — Encounter: Payer: Self-pay | Admitting: Internal Medicine

## 2019-11-27 DIAGNOSIS — I639 Cerebral infarction, unspecified: Secondary | ICD-10-CM | POA: Diagnosis not present

## 2019-11-27 DIAGNOSIS — I471 Supraventricular tachycardia, unspecified: Secondary | ICD-10-CM

## 2019-11-27 DIAGNOSIS — N3946 Mixed incontinence: Secondary | ICD-10-CM | POA: Diagnosis not present

## 2019-11-27 DIAGNOSIS — F339 Major depressive disorder, recurrent, unspecified: Secondary | ICD-10-CM | POA: Diagnosis not present

## 2019-11-27 DIAGNOSIS — R609 Edema, unspecified: Secondary | ICD-10-CM

## 2019-11-27 DIAGNOSIS — W19XXXA Unspecified fall, initial encounter: Secondary | ICD-10-CM

## 2019-11-27 DIAGNOSIS — K219 Gastro-esophageal reflux disease without esophagitis: Secondary | ICD-10-CM | POA: Diagnosis not present

## 2019-11-27 DIAGNOSIS — R1312 Dysphagia, oropharyngeal phase: Secondary | ICD-10-CM | POA: Diagnosis not present

## 2019-11-27 DIAGNOSIS — R1310 Dysphagia, unspecified: Secondary | ICD-10-CM | POA: Diagnosis not present

## 2019-11-27 DIAGNOSIS — R41841 Cognitive communication deficit: Secondary | ICD-10-CM | POA: Diagnosis not present

## 2019-11-27 DIAGNOSIS — R2681 Unsteadiness on feet: Secondary | ICD-10-CM | POA: Diagnosis not present

## 2019-11-27 DIAGNOSIS — K529 Noninfective gastroenteritis and colitis, unspecified: Secondary | ICD-10-CM

## 2019-11-27 DIAGNOSIS — S81811S Laceration without foreign body, right lower leg, sequela: Secondary | ICD-10-CM

## 2019-11-27 DIAGNOSIS — M6281 Muscle weakness (generalized): Secondary | ICD-10-CM | POA: Diagnosis not present

## 2019-11-27 NOTE — Progress Notes (Signed)
Location:   [facility] Room Number: 834 Place of Service:  ALF [phone]) Provider:  Veleta Miners MD   [name], [name], NP  Patient Care Team: [name], [name], NP as PCP - General (Internal Medicine)  Extended Emergency Contact Information Primary Emergency Contact: [name] Mobile Phone: [phone] Relation: Friend Secondary Emergency Contact: [name] Mobile Phone: [phone] Relation: Nephew  Code Status:  DNR Goals of care: Advanced Directive information Advanced Directives 11/13/2019  Does Patient Have a Medical Advance Directive? Yes  Type of Advance Directive Living will  Does patient want to make changes to medical advance directive? No - Patient declined  Copy of [place] in Chart? -  Pre-existing out of facility DNR order (yellow form or pink MOST form) -     Chief Complaint  Patient presents with  . Acute Visit    Fall    HPI:  [name] seen today for an acute visit for skin tear sustained after a fall.  No recent recurrent falls  Stayed in the Hospital from 7/26-8/4 for Acute Infarct with Acute Encephalopathy  Patient has a history of her LE edema, PSVT has refused to see cardiology before, GERD, IBS with diarrhea She has been living in assisted living for past 1 year She was sent to the hospital for acute change in mental status Her initial CT was negative but the MRI of the brain showed cerebral small acute infarcts in the posterior left hemisphere in the left PCA and MCA watershed area. 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 Due to her tachybradycardia syndrome cardiology was consulted which recommended amiodarone and also started her on Eliquis. During that time patient also told the nurses that she was ready to die.  Was seen by psychiatry.  They recommended  palliative care. She was started on SSRI  Patient is back in [place].  But has had number of falls in the past few days.  Patient denies any dizziness.  She states that her legs just give away.  Yesterday she sustained a skin tear in her fall.  Per nurses she is also asking for more help with her ADLs. Past Medical History:  Diagnosis Date  . COVID-19   . 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 No pertinent surgical history.  Allergies  Allergen Reactions  . Azopt [Brinzolamide] Other (See Comments)    UNK  . Brimonidine Other (See Comments)    UNK  . Lumigan [Bimatoprost] Other (See Comments)    UNK    Allergies as of 11/27/2019      Reactions   Azopt [brinzolamide] Other (See Comments)   UNK   Brimonidine Other (See Comments)   UNK   Lumigan [bimatoprost] Other (See Comments)   UNK      Medication List       Accurate as of November 27, 2019 11:59 PM. If you have any questions, ask your nurse or doctor.        STOP taking these medications   potassium chloride SA 20 MEQ tablet Commonly known as: KLOR-CON Stopped by: Virgie Dad, MD   torsemide 20 MG tablet Commonly known as: [place] by: Virgie Dad, MD     TAKE these medications   acetaminophen 325 MG tablet Commonly known as:  TYLENOL Take 650 mg by mouth every 6 (six) hours as needed for mild pain or headache.   amiodarone 100 MG tablet Commonly known as: PACERONE Take 1 tablet (100 mg total) by mouth daily.   apixaban 2.5 MG Tabs tablet Commonly known as: ELIQUIS Take 1 tablet (2.5 mg total) by mouth 2 (two) times daily.   atorvastatin 20 MG tablet Commonly known as: LIPITOR Take 1 tablet (20 mg total) by mouth daily at 6 PM.   b complex vitamins tablet Take 1 tablet by mouth daily.   bismuth subsalicylate 846 NG/29BM suspension Commonly known as: PEPTO BISMOL Take 60 mLs by mouth daily as needed for indigestion  or diarrhea or loose stools.   Calcium Carbonate-Vitamin D 600-400 MG-UNIT tablet Take 1 tablet by mouth daily.   CENTRUM SILVER PO Take 1 tablet by mouth daily. 7am-10am.   diphenoxylate-atropine 2.5-0.025 MG tablet Commonly known as: LOMOTIL Take by mouth daily as needed for diarrhea or loose stools.   Foot Care Products Misc by Does not apply route. Gel bunion cushion once a day on Wednesday   lactose free nutrition Liqd Take 237 mLs by mouth daily.   melatonin 3 MG Tabs tablet Take 3 mg by mouth at bedtime.   NON FORMULARY Moisturizing Cream to extremities with morning and evening care Every Shift   NON FORMULARY Regular Special Instructions: Regular Diet, Thin Liquids   omeprazole 20 MG capsule Commonly known as: PRILOSEC Take 20 mg by mouth daily.   sertraline 25 MG tablet Commonly known as: ZOLOFT Take 0.5 tablets (12.5 mg total) by mouth at bedtime.   timolol 0.5 % ophthalmic solution Commonly known as: BETIMOL Place 1 drop into both eyes daily. 7am-10am.       Review of Systems  Constitutional: Positive for activity change.  HENT: Negative.   Respiratory: Negative.   Cardiovascular: Negative.   Gastrointestinal: Negative.   Genitourinary: Negative.   Musculoskeletal: Positive for gait problem.  Skin: Positive for wound.  Neurological: Positive for weakness.  Psychiatric/Behavioral: Negative.     Immunization History  Administered Date(s) Administered  . Influenza, High Dose Seasonal PF 11/08/2018  . Influenza-Unspecified 11/07/2013  . Moderna SARS-COVID-2 Vaccination 06/06/2019   Pertinent  Health Maintenance Due  Topic Date Due  . PNA vac Low Risk Adult (1 of 2 - PCV13) Never done  . INFLUENZA VACCINE  11/08/2019  . DEXA SCAN  Completed   Fall Risk  05/19/2019 05/14/2019  Falls in the past year? 0 0  Number falls in past yr: 0 0  Injury with Fall? 0 -   Functional Status Survey:    Vitals:   11/27/19 1419  BP: 96/60  Pulse: 60    Resp: 16  Temp: (!) 97.2 F (36.2 C)  SpO2: 95%  Weight: 85 lb (38.6 kg)  Height: 4\' 5"  (1.346 m)   Body mass index is 21.28 kg/m. Physical Exam Vitals reviewed.  Constitutional:      Comments: Very Frail  HENT:     Head: Normocephalic.     Nose: Nose normal.     Mouth/Throat:     Mouth: Mucous membranes are moist.     Pharynx: Oropharynx is clear.  Eyes:     Pupils: Pupils are equal, round, and reactive to light.  Cardiovascular:     Rate and Rhythm: Normal rate and regular rhythm.     Pulses: Normal pulses.  Pulmonary:     Effort: Pulmonary effort is normal. No respiratory distress.  Breath sounds: Normal breath sounds. No wheezing.  Abdominal:     General: Abdomen is flat. Bowel sounds are normal.     Palpations: Abdomen is soft.  Musculoskeletal:        General: No swelling.     Cervical back: Neck supple.  Skin:    General: Skin is warm.     Comments: Small Skin Tear in Left Lower Leg  Neurological:     General: No focal deficit present.     Mental Status: She is alert and oriented to person, place, and time.  Psychiatric:        Mood and Affect: Mood normal.     Labs reviewed: Recent Labs    11/03/19 0454 11/03/19 0454 11/04/19 0535 11/06/19 0544 11/19/19 0000  NA 143   < > 141 138 139  K 4.1   < > 3.6 3.9 4.1  CL 103   < > 107 107 104  CO2 22   < > 24 23 24*  GLUCOSE 76  --  92 93  --   BUN 32*   < > 25* 9 38*  CREATININE 1.02*   < > 0.86 0.72 1.2*  CALCIUM 8.5*   < > 8.2* 8.1* 8.8  MG 2.3  --   --   --   --    < > = values in this interval not displayed.   Recent Labs    11/02/19 1045 11/02/19 1045 11/03/19 0454 11/06/19 0544 11/19/19 0000  AST 34   < > 32 34 24  ALT 19   < > 18 18 18   ALKPHOS 64   < > 57 45 64  BILITOT 0.8  --  0.9 0.3  --   PROT 6.9  --  6.1* 5.1*  --   ALBUMIN 4.0   < > 3.5 2.9* 3.7   < > = values in this interval not displayed.   Recent Labs    04/26/19 0414 06/16/19 0000 06/16/19 0000 07/24/19 0000  07/24/19 0000 10/26/19 0000 11/02/19 1045 11/02/19 1045 11/03/19 0454 11/06/19 0544 11/19/19 0000  WBC   < > 6.1   < > 9.5   < > 7.8 8.6   < > 6.8 4.3 7.7  NEUTROABS  --  6,578  --  7,334  --  5,936  --   --   --   --   --   HGB   < > 12.7   < > 12.8   < > 13.9 15.6*   < > 14.9 12.8 13.6  HCT   < > 39   < > 39   < > 42 48.9*   < > 47.4* 41.1 42  MCV   < >  --   --   --   --   --  94.6  --  96.0 97.4  --   PLT   < > 257   < > 232   < > 251 250   < > 230 206 269   < > = values in this interval not displayed.   Lab Results  Component Value Date   TSH 1.629 11/02/2019   Lab Results  Component Value Date   HGBA1C 5.9 (H) 11/04/2019   Lab Results  Component Value Date   CHOL 138 11/06/2019   HDL 49 11/06/2019   LDLCALC 72 11/06/2019   TRIG 86 11/06/2019   CHOLHDL 2.8 11/06/2019    Significant Diagnostic Results in last 30 days:  DG Chest 2 View  Result Date: 11/02/2019 CLINICAL DATA:  Altered mental status EXAM: CHEST - 2 VIEW COMPARISON:  05/30/2019 FINDINGS: The heart size and mediastinal contours are stable. Mildly hyperexpanded lungs without focal airspace consolidation, pleural effusion, or pneumothorax. The visualized skeletal structures are unremarkable. IMPRESSION: No active cardiopulmonary disease. Electronically Signed   By: Davina Poke D.O.   On: 11/02/2019 11:42   CT Head Wo Contrast  Result Date: 11/02/2019 CLINICAL DATA:  Mental status change EXAM: CT HEAD WITHOUT CONTRAST TECHNIQUE: Contiguous axial images were obtained from the base of the skull through the vertex without intravenous contrast. COMPARISON:  CT head 08/07/2005 FINDINGS: Brain: Moderate atrophy with progression since the prior study. Atrophy most prominent in the frontal lobes. There is ventricular enlargement due to atrophy which has progressed. Periventricular white matter hypodensity bilaterally also has progressed. Negative for acute infarct, hemorrhage, mass. Vascular: Negative for hyperdense  vessel Skull: Negative Sinuses/Orbits: Paranasal sinuses clear. Bilateral cataract extraction. Other: None IMPRESSION: No acute abnormality Progression of atrophy and chronic microvascular ischemic change in the white matter compared with 2007. Electronically Signed   By: Franchot Gallo M.D.   On: 11/02/2019 11:32   MR ANGIO HEAD WO CONTRAST  Result Date: 11/05/2019 CLINICAL DATA:  Initial evaluation for acute stroke. EXAM: MRA HEAD WITHOUT CONTRAST TECHNIQUE: Angiographic images of the Circle of Willis were obtained using MRA technique without intravenous contrast. COMPARISON:  Prior brain MRI from 11/04/2019. FINDINGS: ANTERIOR CIRCULATION: Visualized distal cervical segments of the internal carotid arteries are widely patent with symmetric antegrade flow. Petrous, cavernous, and supraclinoid ICAs widely patent without stenosis or other abnormality. Origin of the ophthalmic arteries patent. ICA termini well perfused. A1 segments patent bilaterally. Normal anterior communicating artery complex. Anterior cerebral arteries widely patent to their distal aspects without stenosis. No M1 stenosis or occlusion. Normal MCA bifurcations. Distal MCA branches well perfused and symmetric. POSTERIOR CIRCULATION: Vertebral arteries widely patent to the vertebrobasilar junction without stenosis. Left vertebral artery dominant. Partially visualized posterior inferior cerebral arteries patent bilaterally. Basilar widely patent to its distal aspect without stenosis. Superior cerebral arteries patent bilaterally. Both PCAs primarily supplied via the basilar well perfused to their distal aspects. No intracranial aneurysm or other vascular abnormality. IMPRESSION: Normal intracranial MRA. No large vessel occlusion, hemodynamically significant stenosis, or other vascular abnormality. No aneurysm. Electronically Signed   By: Jeannine Boga M.D.   On: 11/05/2019 19:49   MR BRAIN WO CONTRAST  Result Date: 11/04/2019 CLINICAL  DATA:  86 year old female with altered mental status for 2 days. EXAM: MRI HEAD WITHOUT CONTRAST TECHNIQUE: Multiplanar, multiecho pulse sequences of the brain and surrounding structures were obtained without intravenous contrast. COMPARISON:  Head CT 11/02/2019. FINDINGS: Brain: Patchy, scattered restricted diffusion in the posterior left hemisphere seen in the inferior left parietal lobe cortex (series 5, image 81), left periatrial white matter approaching the splenium of the corpus callosum (image 77), and occipital lobe white matter (image 70). Associated mild T2 and FLAIR hyperintensity in these areas with no evidence of hemorrhage. No mass effect. No other restricted diffusion. Patchy bilateral periventricular white matter T2 and FLAIR hyperintensity with some areas most resembling chronic white matter lacunar infarcts. But no chronic cortical encephalomalacia identified. No chronic cerebral blood products. No midline shift, mass effect, evidence of mass lesion, ventriculomegaly, extra-axial collection or acute intracranial hemorrhage. Cervicomedullary junction and pituitary are within normal limits. Vascular: Major intracranial vascular flow voids are preserved. Skull and upper cervical spine: Partially visible cervical spine degeneration including  ligamentous hypertrophy about the odontoid. Visualized bone marrow signal is within normal limits. Sinuses/Orbits: Postoperative changes to both globes. No acute findings. Other: Mild left mastoid effusion.  Visible pharynx appears normal. IMPRESSION: 1. 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 No associated hemorrhage or mass effect. 2. Underlying chronic white matter signal changes most likely due to small vessel disease. 3. Mild left mastoid effusion, likely postinflammatory. Electronically Signed   By: Genevie Ann M.D.   On: 11/04/2019 20:53   DG Foot 2 Views Right  Result Date:  11/08/2019 CLINICAL DATA:  Plantar foot pain cross the ball of the foot at the first through third metatarsal-phalangeal joint region. EXAM: RIGHT FOOT - 2 VIEW COMPARISON:  None. FINDINGS: Mild pes cavus and hammertoe deformity of the third and fourth phalanges. Trace osteoarthritis of the first metatarsal phalangeal joint. No erosion, periosteal reaction, or bony destruction. No plantar calcaneal spur. No focal soft tissue abnormality. IMPRESSION: 1. Mild osteoarthritis of the first metatarsophalangeal joint. 2. Pes cavus and hammertoe deformity of the third and fourth phalanges. Electronically Signed   By: Keith Rake M.D.   On: 11/08/2019 15:09   ECHOCARDIOGRAM COMPLETE  Result Date: 11/05/2019    ECHOCARDIOGRAM REPORT   Patient Name:   Bianca [name] Date of Exam: 11/05/2019 Medical Rec #:  [id number]        Height:       60.0 in Accession #:    [id number]       Weight:       80.2 lb Date of Birth:  Feb 10, 1923         BSA:          1.266 m Patient Age:    89 years         BP:           117/54 mmHg Patient Gender: F                HR:           52 bpm. Exam Location:  Inpatient Procedure: 2D Echo, Cardiac Doppler and Color Doppler Indications:    COVID-19  History:        Patient has no prior history of Echocardiogram examinations.  Sonographer:    Jonelle Sidle Dance Referring Phys: Lewie Chamber AKULA IMPRESSIONS  1. Left ventricular ejection fraction, by estimation, is 60 to 65%. The left ventricle has normal function. The left ventricle has no regional wall motion abnormalities. There is mild left ventricular hypertrophy. Left ventricular diastolic parameters are consistent with Grade II diastolic dysfunction (pseudonormalization). Elevated left atrial pressure.  2. Right ventricular systolic function is normal. The right ventricular size is normal. There is mildly elevated pulmonary artery systolic pressure. The estimated right ventricular systolic pressure is 03.5 mmHg.  3. Right atrial size was mildly  dilated.  4. The mitral valve is normal in structure. Trivial mitral valve regurgitation.  5. Tricuspid valve regurgitation is moderate to severe.  6. The aortic valve is tricuspid. Aortic valve regurgitation is mild. Mild aortic valve sclerosis is present, with no evidence of aortic valve stenosis.  7. The inferior vena cava is normal in size with greater than 50% respiratory variability, suggesting right atrial pressure of 3 mmHg. FINDINGS  Left Ventricle: Left ventricular ejection fraction, by estimation, is 60 to 65%. The left ventricle has normal function. The left ventricle has no regional wall motion abnormalities. The left ventricular internal cavity size was  small. There is mild left ventricular hypertrophy. Left ventricular diastolic parameters are consistent with Grade II diastolic dysfunction (pseudonormalization). Elevated left atrial pressure. Right Ventricle: The right ventricular size is normal. Right vetricular wall thickness was not assessed. Right ventricular systolic function is normal. There is mildly elevated pulmonary artery systolic pressure. The tricuspid regurgitant velocity is 2.97 m/s, and with an assumed right atrial pressure of 3 mmHg, the estimated right ventricular systolic pressure is 99.3 mmHg. Left Atrium: Left atrial size was normal in size. Right Atrium: Right atrial size was mildly dilated. Pericardium: Trivial pericardial effusion is present. Mitral Valve: The mitral valve is normal in structure. Trivial mitral valve regurgitation. Tricuspid Valve: The tricuspid valve is normal in structure. Tricuspid valve regurgitation is moderate to severe. Aortic Valve: The aortic valve is tricuspid. Aortic valve regurgitation is mild. Aortic regurgitation PHT measures 558 msec. Mild aortic valve sclerosis is present, with no evidence of aortic valve stenosis. Pulmonic Valve: The pulmonic valve was grossly normal. Pulmonic valve regurgitation is trivial. Aorta: The aortic root and ascending  aorta are structurally normal, with no evidence of dilitation. Venous: The inferior vena cava is normal in size with greater than 50% respiratory variability, suggesting right atrial pressure of 3 mmHg. IAS/Shunts: The interatrial septum was not well visualized.  LEFT VENTRICLE PLAX 2D LVIDd:         3.21 cm LVIDs:         2.01 cm LV PW:         1.15 cm LV IVS:        0.90 cm LVOT diam:     1.70 cm LV SV:         47 LV SV Index:   37 LVOT Area:     2.27 cm  RIGHT VENTRICLE          IVC RV Basal diam:  2.88 cm  IVC diam: 1.49 cm RV Mid diam:    1.94 cm TAPSE (M-mode): 1.8 cm LEFT ATRIUM             Index       RIGHT ATRIUM           Index LA diam:        3.20 cm 2.53 cm/m  RA Area:     13.90 cm LA Vol (A2C):   44.4 ml 35.07 ml/m RA Volume:   34.40 ml  27.17 ml/m LA Vol (A4C):   35.0 ml 27.64 ml/m LA Biplane Vol: 40.9 ml 32.30 ml/m  AORTIC VALVE LVOT Vmax:   94.80 cm/s LVOT Vmean:  63.000 cm/s LVOT VTI:    0.205 m AI PHT:      558 msec  AORTA Ao Root diam: 2.80 cm Ao Asc diam:  2.70 cm MITRAL VALVE                TRICUSPID VALVE MV Area (PHT): 3.21 cm     TR Peak grad:   35.3 mmHg MV Decel Time: 236 msec     TR Vmax:        297.00 cm/s MV E velocity: 102.00 cm/s MV A velocity: 68.30 cm/s   SHUNTS MV E/A ratio:  1.49         Systemic VTI:  0.20 m                             Systemic Diam: 1.70 cm Oswaldo Milian MD Electronically signed by Oswaldo Milian MD Signature  Date/Time: 11/05/2019/5:59:08 PM    Final    VAS US CAROTID  Result Date: 11/09/2019 Carotid Arterial Duplex Study Indications:       CVA. Comparison Study:  no prior Performing Technologist: Abram Sander RVS  Examination Guidelines: A complete evaluation includes B-mode imaging, spectral Doppler, color Doppler, and power Doppler as needed of all accessible portions of each vessel. Bilateral testing is considered an integral part of a complete examination. Limited examinations for reoccurring indications may be performed as noted.   Right Carotid Findings: +----------+--------+--------+--------+------------------+--------+           PSV cm/sEDV cm/sStenosisPlaque DescriptionComments +----------+--------+--------+--------+------------------+--------+ CCA Prox  59      12              heterogenous               +----------+--------+--------+--------+------------------+--------+ CCA Distal52      7               heterogenous               +----------+--------+--------+--------+------------------+--------+ ICA Prox  34      10      1-39%   heterogenous               +----------+--------+--------+--------+------------------+--------+ ICA Distal47      11                                         +----------+--------+--------+--------+------------------+--------+ ECA       55                                                 +----------+--------+--------+--------+------------------+--------+ +----------+--------+-------+--------+-------------------+           PSV cm/sEDV cmsDescribeArm Pressure (mmHG) +----------+--------+-------+--------+-------------------+ [id number]                                         +----------+--------+-------+--------+-------------------+ +---------+--------+--+--------+--+---------+ VertebralPSV cm/s52EDV cm/s14Antegrade +---------+--------+--+--------+--+---------+  Left Carotid Findings: +----------+--------+--------+--------+------------------+--------+           PSV cm/sEDV cm/sStenosisPlaque DescriptionComments +----------+--------+--------+--------+------------------+--------+ CCA Prox  57      14              heterogenous               +----------+--------+--------+--------+------------------+--------+ CCA Distal63      12              heterogenous               +----------+--------+--------+--------+------------------+--------+ ICA Prox  59      13      1-39%   heterogenous      tortuous  +----------+--------+--------+--------+------------------+--------+ ICA Distal86      14                                         +----------+--------+--------+--------+------------------+--------+ ECA       77                                                 +----------+--------+--------+--------+------------------+--------+ +----------+--------+--------+--------+-------------------+  PSV cm/sEDV cm/sDescribeArm Pressure (mmHG) +----------+--------+--------+--------+-------------------+ [id number]                                          +----------+--------+--------+--------+-------------------+ +---------+--------+--+--------+-+---------+ VertebralPSV cm/s86EDV cm/s9Antegrade +---------+--------+--+--------+-+---------+   Summary: Right Carotid: Velocities in the right ICA are consistent with a 1-39% stenosis. Left Carotid: Velocities in the left ICA are consistent with a 1-39% stenosis. Vertebrals: Bilateral vertebral arteries demonstrate antegrade flow. *See table(s) above for measurements and observations.  Electronically signed by Antony Contras MD on 11/09/2019 at 1:20:26 PM.    Final     Assessment/Plan Fall, initial encounter Discontinue Demadex and Potassium Repeat CBC and CMP HR and BP Q Shift  If has bradycardia will consider Reducing dose of Amiodarone to 4 times a week Check CMP and CBC Cerebrovascular accident (CVA), unspecified mechanism ([place]) On Eliquis now Have to reconsider if patient keeps falling Also oN statin Edema, unspecified type None right now If it comes back will consider diuretic again Paroxysmal SVT (supraventricular tachycardia) ([place]) Continue Amiodarone Check Vitals If continues with Bradycardia then possible reducing dose of Amiodarone Gastroesophageal reflux disease, unspecified whether esophagitis present Prilosec Depression, recurrent ([facility]) On Low dose of Zoloft Chronic diarrhea Lomotil PRn Skin tear of right lower  leg without complication, sequela Dressing QOD    Family/ staff Communication:   Labs/tests ordered:  CMP and CBC

## 2019-11-30 DIAGNOSIS — M6281 Muscle weakness (generalized): Secondary | ICD-10-CM | POA: Diagnosis not present

## 2019-11-30 DIAGNOSIS — R2681 Unsteadiness on feet: Secondary | ICD-10-CM | POA: Diagnosis not present

## 2019-11-30 DIAGNOSIS — R1310 Dysphagia, unspecified: Secondary | ICD-10-CM | POA: Diagnosis not present

## 2019-11-30 DIAGNOSIS — R1312 Dysphagia, oropharyngeal phase: Secondary | ICD-10-CM | POA: Diagnosis not present

## 2019-11-30 DIAGNOSIS — N3946 Mixed incontinence: Secondary | ICD-10-CM | POA: Diagnosis not present

## 2019-11-30 DIAGNOSIS — R41841 Cognitive communication deficit: Secondary | ICD-10-CM | POA: Diagnosis not present

## 2019-12-01 DIAGNOSIS — M6281 Muscle weakness (generalized): Secondary | ICD-10-CM | POA: Diagnosis not present

## 2019-12-01 DIAGNOSIS — R1310 Dysphagia, unspecified: Secondary | ICD-10-CM | POA: Diagnosis not present

## 2019-12-01 DIAGNOSIS — N3946 Mixed incontinence: Secondary | ICD-10-CM | POA: Diagnosis not present

## 2019-12-01 DIAGNOSIS — R1312 Dysphagia, oropharyngeal phase: Secondary | ICD-10-CM | POA: Diagnosis not present

## 2019-12-01 DIAGNOSIS — R2681 Unsteadiness on feet: Secondary | ICD-10-CM | POA: Diagnosis not present

## 2019-12-01 DIAGNOSIS — R41841 Cognitive communication deficit: Secondary | ICD-10-CM | POA: Diagnosis not present

## 2019-12-01 DIAGNOSIS — I10 Essential (primary) hypertension: Secondary | ICD-10-CM | POA: Diagnosis not present

## 2019-12-02 DIAGNOSIS — R1310 Dysphagia, unspecified: Secondary | ICD-10-CM | POA: Diagnosis not present

## 2019-12-02 DIAGNOSIS — R1312 Dysphagia, oropharyngeal phase: Secondary | ICD-10-CM | POA: Diagnosis not present

## 2019-12-02 DIAGNOSIS — N3946 Mixed incontinence: Secondary | ICD-10-CM | POA: Diagnosis not present

## 2019-12-02 DIAGNOSIS — R41841 Cognitive communication deficit: Secondary | ICD-10-CM | POA: Diagnosis not present

## 2019-12-02 DIAGNOSIS — R2681 Unsteadiness on feet: Secondary | ICD-10-CM | POA: Diagnosis not present

## 2019-12-02 DIAGNOSIS — M6281 Muscle weakness (generalized): Secondary | ICD-10-CM | POA: Diagnosis not present

## 2019-12-03 DIAGNOSIS — M6281 Muscle weakness (generalized): Secondary | ICD-10-CM | POA: Diagnosis not present

## 2019-12-03 DIAGNOSIS — R2681 Unsteadiness on feet: Secondary | ICD-10-CM | POA: Diagnosis not present

## 2019-12-03 DIAGNOSIS — R1312 Dysphagia, oropharyngeal phase: Secondary | ICD-10-CM | POA: Diagnosis not present

## 2019-12-03 DIAGNOSIS — R1310 Dysphagia, unspecified: Secondary | ICD-10-CM | POA: Diagnosis not present

## 2019-12-03 DIAGNOSIS — R41841 Cognitive communication deficit: Secondary | ICD-10-CM | POA: Diagnosis not present

## 2019-12-03 DIAGNOSIS — N3946 Mixed incontinence: Secondary | ICD-10-CM | POA: Diagnosis not present

## 2019-12-04 DIAGNOSIS — R41841 Cognitive communication deficit: Secondary | ICD-10-CM | POA: Diagnosis not present

## 2019-12-04 DIAGNOSIS — N3946 Mixed incontinence: Secondary | ICD-10-CM | POA: Diagnosis not present

## 2019-12-04 DIAGNOSIS — M6281 Muscle weakness (generalized): Secondary | ICD-10-CM | POA: Diagnosis not present

## 2019-12-04 DIAGNOSIS — R1312 Dysphagia, oropharyngeal phase: Secondary | ICD-10-CM | POA: Diagnosis not present

## 2019-12-04 DIAGNOSIS — R1310 Dysphagia, unspecified: Secondary | ICD-10-CM | POA: Diagnosis not present

## 2019-12-04 DIAGNOSIS — R2681 Unsteadiness on feet: Secondary | ICD-10-CM | POA: Diagnosis not present

## 2019-12-07 DIAGNOSIS — R2681 Unsteadiness on feet: Secondary | ICD-10-CM | POA: Diagnosis not present

## 2019-12-07 DIAGNOSIS — N3946 Mixed incontinence: Secondary | ICD-10-CM | POA: Diagnosis not present

## 2019-12-07 DIAGNOSIS — R1310 Dysphagia, unspecified: Secondary | ICD-10-CM | POA: Diagnosis not present

## 2019-12-07 DIAGNOSIS — M6281 Muscle weakness (generalized): Secondary | ICD-10-CM | POA: Diagnosis not present

## 2019-12-07 DIAGNOSIS — R41841 Cognitive communication deficit: Secondary | ICD-10-CM | POA: Diagnosis not present

## 2019-12-07 DIAGNOSIS — R1312 Dysphagia, oropharyngeal phase: Secondary | ICD-10-CM | POA: Diagnosis not present

## 2019-12-08 DIAGNOSIS — M6281 Muscle weakness (generalized): Secondary | ICD-10-CM | POA: Diagnosis not present

## 2019-12-08 DIAGNOSIS — R2681 Unsteadiness on feet: Secondary | ICD-10-CM | POA: Diagnosis not present

## 2019-12-08 DIAGNOSIS — R41841 Cognitive communication deficit: Secondary | ICD-10-CM | POA: Diagnosis not present

## 2019-12-08 DIAGNOSIS — R1310 Dysphagia, unspecified: Secondary | ICD-10-CM | POA: Diagnosis not present

## 2019-12-08 DIAGNOSIS — R1312 Dysphagia, oropharyngeal phase: Secondary | ICD-10-CM | POA: Diagnosis not present

## 2019-12-08 DIAGNOSIS — N3946 Mixed incontinence: Secondary | ICD-10-CM | POA: Diagnosis not present

## 2019-12-09 DIAGNOSIS — H353220 Exudative age-related macular degeneration, left eye, stage unspecified: Secondary | ICD-10-CM | POA: Diagnosis not present

## 2019-12-09 DIAGNOSIS — R29818 Other symptoms and signs involving the nervous system: Secondary | ICD-10-CM | POA: Diagnosis not present

## 2019-12-09 DIAGNOSIS — R2681 Unsteadiness on feet: Secondary | ICD-10-CM | POA: Diagnosis not present

## 2019-12-09 DIAGNOSIS — R55 Syncope and collapse: Secondary | ICD-10-CM | POA: Diagnosis not present

## 2019-12-09 DIAGNOSIS — B029 Zoster without complications: Secondary | ICD-10-CM | POA: Diagnosis not present

## 2019-12-09 DIAGNOSIS — R350 Frequency of micturition: Secondary | ICD-10-CM | POA: Diagnosis not present

## 2019-12-09 DIAGNOSIS — R41841 Cognitive communication deficit: Secondary | ICD-10-CM | POA: Diagnosis not present

## 2019-12-09 DIAGNOSIS — R1310 Dysphagia, unspecified: Secondary | ICD-10-CM | POA: Diagnosis not present

## 2019-12-09 DIAGNOSIS — S81811D Laceration without foreign body, right lower leg, subsequent encounter: Secondary | ICD-10-CM | POA: Diagnosis not present

## 2019-12-09 DIAGNOSIS — M6281 Muscle weakness (generalized): Secondary | ICD-10-CM | POA: Diagnosis not present

## 2019-12-09 DIAGNOSIS — W010XXD Fall on same level from slipping, tripping and stumbling without subsequent striking against object, subsequent encounter: Secondary | ICD-10-CM | POA: Diagnosis not present

## 2019-12-09 DIAGNOSIS — R197 Diarrhea, unspecified: Secondary | ICD-10-CM | POA: Diagnosis not present

## 2019-12-09 DIAGNOSIS — N3946 Mixed incontinence: Secondary | ICD-10-CM | POA: Diagnosis not present

## 2019-12-09 DIAGNOSIS — H25013 Cortical age-related cataract, bilateral: Secondary | ICD-10-CM | POA: Diagnosis not present

## 2019-12-09 DIAGNOSIS — H40009 Preglaucoma, unspecified, unspecified eye: Secondary | ICD-10-CM | POA: Diagnosis not present

## 2019-12-09 DIAGNOSIS — R41842 Visuospatial deficit: Secondary | ICD-10-CM | POA: Diagnosis not present

## 2019-12-09 DIAGNOSIS — N951 Menopausal and female climacteric states: Secondary | ICD-10-CM | POA: Diagnosis not present

## 2019-12-09 DIAGNOSIS — I639 Cerebral infarction, unspecified: Secondary | ICD-10-CM | POA: Diagnosis not present

## 2019-12-09 DIAGNOSIS — R1312 Dysphagia, oropharyngeal phase: Secondary | ICD-10-CM | POA: Diagnosis not present

## 2019-12-09 DIAGNOSIS — R32 Unspecified urinary incontinence: Secondary | ICD-10-CM | POA: Diagnosis not present

## 2019-12-10 DIAGNOSIS — M6281 Muscle weakness (generalized): Secondary | ICD-10-CM | POA: Diagnosis not present

## 2019-12-10 DIAGNOSIS — R2681 Unsteadiness on feet: Secondary | ICD-10-CM | POA: Diagnosis not present

## 2019-12-10 DIAGNOSIS — N3946 Mixed incontinence: Secondary | ICD-10-CM | POA: Diagnosis not present

## 2019-12-10 DIAGNOSIS — R1312 Dysphagia, oropharyngeal phase: Secondary | ICD-10-CM | POA: Diagnosis not present

## 2019-12-10 DIAGNOSIS — R41842 Visuospatial deficit: Secondary | ICD-10-CM | POA: Diagnosis not present

## 2019-12-10 DIAGNOSIS — R41841 Cognitive communication deficit: Secondary | ICD-10-CM | POA: Diagnosis not present

## 2019-12-11 DIAGNOSIS — M6281 Muscle weakness (generalized): Secondary | ICD-10-CM | POA: Diagnosis not present

## 2019-12-11 DIAGNOSIS — R2681 Unsteadiness on feet: Secondary | ICD-10-CM | POA: Diagnosis not present

## 2019-12-11 DIAGNOSIS — N3946 Mixed incontinence: Secondary | ICD-10-CM | POA: Diagnosis not present

## 2019-12-11 DIAGNOSIS — R41841 Cognitive communication deficit: Secondary | ICD-10-CM | POA: Diagnosis not present

## 2019-12-11 DIAGNOSIS — R41842 Visuospatial deficit: Secondary | ICD-10-CM | POA: Diagnosis not present

## 2019-12-11 DIAGNOSIS — R1312 Dysphagia, oropharyngeal phase: Secondary | ICD-10-CM | POA: Diagnosis not present

## 2019-12-14 DIAGNOSIS — R2681 Unsteadiness on feet: Secondary | ICD-10-CM | POA: Diagnosis not present

## 2019-12-14 DIAGNOSIS — N3946 Mixed incontinence: Secondary | ICD-10-CM | POA: Diagnosis not present

## 2019-12-14 DIAGNOSIS — R41842 Visuospatial deficit: Secondary | ICD-10-CM | POA: Diagnosis not present

## 2019-12-14 DIAGNOSIS — R41841 Cognitive communication deficit: Secondary | ICD-10-CM | POA: Diagnosis not present

## 2019-12-14 DIAGNOSIS — R1312 Dysphagia, oropharyngeal phase: Secondary | ICD-10-CM | POA: Diagnosis not present

## 2019-12-14 DIAGNOSIS — M6281 Muscle weakness (generalized): Secondary | ICD-10-CM | POA: Diagnosis not present

## 2019-12-16 ENCOUNTER — Encounter: Payer: Self-pay | Admitting: Nurse Practitioner

## 2019-12-16 ENCOUNTER — Non-Acute Institutional Stay: Payer: Medicare Other | Admitting: Nurse Practitioner

## 2019-12-16 DIAGNOSIS — R41842 Visuospatial deficit: Secondary | ICD-10-CM | POA: Diagnosis not present

## 2019-12-16 DIAGNOSIS — R609 Edema, unspecified: Secondary | ICD-10-CM

## 2019-12-16 DIAGNOSIS — K219 Gastro-esophageal reflux disease without esophagitis: Secondary | ICD-10-CM

## 2019-12-16 DIAGNOSIS — M6281 Muscle weakness (generalized): Secondary | ICD-10-CM | POA: Diagnosis not present

## 2019-12-16 DIAGNOSIS — I471 Supraventricular tachycardia, unspecified: Secondary | ICD-10-CM

## 2019-12-16 DIAGNOSIS — R1312 Dysphagia, oropharyngeal phase: Secondary | ICD-10-CM | POA: Diagnosis not present

## 2019-12-16 DIAGNOSIS — I639 Cerebral infarction, unspecified: Secondary | ICD-10-CM

## 2019-12-16 DIAGNOSIS — N3946 Mixed incontinence: Secondary | ICD-10-CM | POA: Diagnosis not present

## 2019-12-16 DIAGNOSIS — F4323 Adjustment disorder with mixed anxiety and depressed mood: Secondary | ICD-10-CM | POA: Diagnosis not present

## 2019-12-16 DIAGNOSIS — R2681 Unsteadiness on feet: Secondary | ICD-10-CM | POA: Diagnosis not present

## 2019-12-16 DIAGNOSIS — R41841 Cognitive communication deficit: Secondary | ICD-10-CM | POA: Diagnosis not present

## 2019-12-16 NOTE — Progress Notes (Signed)
Location:   [place] Room Number: 914 Place of Service:  ALF [phone]) Provider:  , Lennie Odor, NP  ,  X, NP  Patient Care Team: ,  X, NP as PCP - General (Internal Medicine)  Extended Emergency Contact Information Primary Emergency Contact: [name] Mobile Phone: [phone] Relation: Friend Secondary Emergency Contact: [name] Mobile Phone: [phone] Relation: Nephew  Code Status:  DNR Goals of care: Advanced Directive information Advanced Directives 12/16/2019  Does Patient Have a Medical Advance Directive? Yes  Type of Paramedic of [place];Living will;Out of facility DNR (pink MOST or yellow form)  Does patient want to make changes to medical advance directive? No - Patient declined  Copy of [place] in Chart? Yes - validated most recent copy scanned in chart (See row information)  Pre-existing out of facility DNR order (yellow form or pink MOST form) Yellow form placed in chart (order not valid for inpatient use)     Chief Complaint  Patient presents with   Medical agement of Chronic Issues    Routine follow up visit    HPI:  [name] seen today for medical management of chronic diseases.        Hospital stay 11/02/19-11/11/19 for acute CVA (MRI cevebral small acute infarcts in the posterior left hemisphere in the left PCA and MCA watershed area. No significant carotid stenosis, Echo EF 28-41%), embolic etiology             Edema, trace off Torsemide.              PSVT takes Amiodarone, Eliquis.              GERD, takes Omeprazole.              Depression, takes Sertraline.   Past Medical History:  Diagnosis Date   COVID-19    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 ([place])    History reviewed. No pertinent surgical history.  Allergies  Allergen Reactions   Azopt [Brinzolamide] Other (See Comments)      UNK   Brimonidine Other (See Comments)    UNK   Lumigan [Bimatoprost] Other (See Comments)    UNK    Allergies as of 12/16/2019      Reactions   Azopt [brinzolamide] Other (See Comments)   UNK   Brimonidine Other (See Comments)   UNK   Lumigan [bimatoprost] Other (See Comments)   UNK      Medication List       Accurate as of December 16, 2019 11:59 PM. If you have any questions, ask your nurse or doctor.        acetaminophen 325 MG tablet Commonly known as: TYLENOL Take 650 mg by mouth every 6 (six) hours as needed for mild pain or headache.   amiodarone 100 MG tablet Commonly known as: PACERONE Take 1 tablet (100 mg total) by mouth daily.   apixaban 2.5 MG Tabs tablet Commonly known as: ELIQUIS Take 1 tablet (2.5 mg total) by mouth 2 (two) times daily.   atorvastatin 20 MG tablet Commonly known as: LIPITOR Take 1 tablet (20 mg total) by mouth daily at 6 PM.   b complex vitamins tablet Take 1 tablet by mouth daily.   bismuth subsalicylate 324 MW/10UV suspension Commonly known as: PEPTO BISMOL Take 60 mLs by mouth daily as needed for indigestion or diarrhea or loose stools.   Calcium  Carbonate-Vitamin D 600-400 MG-UNIT tablet Take 1 tablet by mouth daily.   CENTRUM SILVER PO Take 1 tablet by mouth daily. 7am-10am.   diphenoxylate-atropine 2.5-0.025 MG tablet Commonly known as: LOMOTIL Take by mouth daily as needed for diarrhea or loose stools.   Foot Care Products Misc by Does not apply route. Gel bunion cushion once a day on Wednesday   lactose free nutrition Liqd Take 237 mLs by mouth daily.   melatonin 3 MG Tabs tablet Take 3 mg by mouth at bedtime.   NON FORMULARY Moisturizing Cream to extremities with morning and evening care Every Shift   NON FORMULARY Regular Special Instructions: Regular Diet, Thin Liquids   omeprazole 20 MG capsule Commonly known as: PRILOSEC Take 20 mg by mouth daily.   sertraline 25 MG tablet Commonly known  as: ZOLOFT Take 0.5 tablets (12.5 mg total) by mouth at bedtime.   timolol 0.5 % ophthalmic solution Commonly known as: BETIMOL Place 1 drop into both eyes daily. 7am-10am.       Review of Systems  Constitutional: Negative for fatigue, fever and unexpected weight change.  HENT: Positive for hearing loss and trouble swallowing. Negative for congestion and voice change.   Eyes: Negative for visual disturbance.  Respiratory: Negative for cough and shortness of breath.   Cardiovascular: Negative for palpitations and leg swelling.       RLE  Gastrointestinal: Negative for abdominal distention, abdominal pain, anal bleeding, blood in stool, constipation, nausea and vomiting.  Genitourinary: Positive for frequency. Negative for difficulty urinating, dysuria and urgency.  Musculoskeletal: Positive for gait problem.  Skin: Positive for pallor.  Neurological: Negative for dizziness, speech difficulty and weakness.  Psychiatric/Behavioral: Positive for confusion. Negative for behavioral problems and sleep disturbance.    Immunization History  Administered Date(s) Administered   Influenza, High Dose Seasonal PF 11/08/2018   Influenza-Unspecified 11/07/2013   Moderna SARS-COVID-2 Vaccination 06/06/2019   Pertinent  Health Maintenance Due  Topic Date Due   PNA vac Low Risk Adult (1 of 2 - PCV13) Never done   INFLUENZA VACCINE  11/08/2019   DEXA SCAN  Completed   Fall Risk  05/19/2019 05/14/2019  Falls in the past year? 0 0  Number falls in past yr: 0 0  Injury with Fall? 0 -   Functional Status Survey:    Vitals:   12/16/19 1330  BP: 110/64  Pulse: 64  Resp: 18  Temp: 97.6 F (36.4 C)  SpO2: 97%  Weight: 79 lb 3.2 oz (35.9 kg)  Height: 4' 5" (1.346 m)   Body mass index is 19.82 kg/m. Physical Exam Vitals and nursing note reviewed.  Constitutional:      Appearance: Normal appearance.  HENT:     Head: Normocephalic and atraumatic.     Mouth/Throat:     Mouth: Mucous  membranes are moist.  Eyes:     Extraocular Movements: Extraocular movements intact.     Conjunctiva/sclera: Conjunctivae normal.     Pupils: Pupils are equal, round, and reactive to light.  Cardiovascular:     Rate and Rhythm: Normal rate and regular rhythm.     Heart sounds: No murmur heard.   Pulmonary:     Breath sounds: No wheezing or rales.  Abdominal:     General: Bowel sounds are normal. There is no distension.     Palpations: Abdomen is soft.     Tenderness: There is no abdominal tenderness. There is no right CVA tenderness, left CVA tenderness, guarding or rebound.  Musculoskeletal:  Cervical back: Normal range of motion and neck supple.     Right lower leg: No edema.     Left lower leg: No edema.  Skin:    General: Skin is warm and dry.     Coloration: Skin is pale.     Comments: Appears pale to me, the patient denied generalized weakness, dizziness, chest pain/pressure, palpitation, abd pain, nausea, vomiting.   Neurological:     General: No focal deficit present.     Mental Status: She is alert and oriented to person, place, and time. Mental status is at baseline.     Motor: No weakness.     Coordination: Coordination normal.     Gait: Gait abnormal.  Psychiatric:        Mood and Affect: Mood normal.        Behavior: Behavior normal.        Thought Content: Thought content normal.     Labs reviewed: Recent Labs    11/03/19 0454 11/03/19 0454 11/04/19 0535 11/06/19 0544 11/19/19 0000  NA 143   < > 141 138 139  K 4.1   < > 3.6 3.9 4.1  CL 103   < > 107 107 104  CO2 22   < > 24 23 24*  GLUCOSE 76  --  92 93  --   BUN 32*   < > 25* 9 38*  CREATININE 1.02*   < > 0.86 0.72 1.2*  CALCIUM 8.5*   < > 8.2* 8.1* 8.8  MG 2.3  --   --   --   --    < > = values in this interval not displayed.   Recent Labs    11/02/19 1045 11/02/19 1045 11/03/19 0454 11/06/19 0544 11/19/19 0000  AST 34   < > 32 34 24  ALT 19   < > _0 ALKPHOS 64   < > 57 45 64    BILITOT 0.8  --  0.9 0.3  --   PROT 6.9  --  6.1* 5.1*  --   ALBUMIN 4.0   < > 3.5 2.9* 3.7   < > = values in this interval not displayed.   Recent Labs    04/26/19 0414 06/16/19 0000 06/16/19 0000 07/24/19 0000 07/24/19 0000 10/26/19 0000 11/02/19 1045 11/02/19 1045 11/03/19 0454 11/06/19 0544 11/19/19 0000  WBC   < > 6.1   < > 9.5   < > 7.8 8.6   < > 6.8 4.3 7.7  NEUTROABS  --  8,127  --  7,334  --  5,936  --   --   --   --   --   HGB   < > 12.7   < > 12.8   < > 13.9 15.6*   < > 14.9 12.8 13.6  HCT   < > 39   < > 39   < > 42 48.9*   < > 47.4* 41.1 42  MCV   < >  --   --   --   --   --  94.6  --  96.0 97.4  --   PLT   < > 257   < > 232   < > 251 250   < > 230 206 269   < > = values in this interval not displayed.   Lab Results  Component Value Date   TSH 1.629 11/02/2019   Lab Results  Component Value Date  HGBA1C 5.9 (H) 11/04/2019   Lab Results  Component Value Date   CHOL 138 11/06/2019   HDL 49 11/06/2019   LDLCALC 72 11/06/2019   TRIG 86 11/06/2019   CHOLHDL 2.8 11/06/2019    Significant Diagnostic Results in last 30 days:  No results found.  Assessment/Plan Paroxysmal SVT (supraventricular tachycardia) (HCC) Heart rate is in control, continue Amiodarone, Eliquis. The patient appears pale to me, Appears pale to me, the patient denied generalized weakness, dizziness, chest pain/pressure, palpitation, abd pain, nausea, vomiting.  FOBT x3, CBC/diff, CMP/eGFR  Stroke [facility]) Hospital stay 11/02/19-11/11/19 for acute CVA (MRI cevebral small acute infarcts in the posterior left hemisphere in the left PCA and MCA watershed area. No significant carotid stenosis, Echo EF 49-20%), embolic etiology   GERD (gastroesophageal reflux disease) Stable, continue Omeprazole.   Adjustment disorder with mixed anxiety and depressed mood Her mood is stable, continue Sertraline.   Edema Not apparent      Family/ staff Communication: plan of care reviewed with the  patient and charge nurse.   Labs/tests ordered:  CBC/diff, CMP/eGFR in am  Time spend 40 minutes.

## 2019-12-17 ENCOUNTER — Encounter: Payer: Self-pay | Admitting: Nurse Practitioner

## 2019-12-17 DIAGNOSIS — R1312 Dysphagia, oropharyngeal phase: Secondary | ICD-10-CM | POA: Diagnosis not present

## 2019-12-17 DIAGNOSIS — R41842 Visuospatial deficit: Secondary | ICD-10-CM | POA: Diagnosis not present

## 2019-12-17 DIAGNOSIS — R2681 Unsteadiness on feet: Secondary | ICD-10-CM | POA: Diagnosis not present

## 2019-12-17 DIAGNOSIS — N3946 Mixed incontinence: Secondary | ICD-10-CM | POA: Diagnosis not present

## 2019-12-17 DIAGNOSIS — M6281 Muscle weakness (generalized): Secondary | ICD-10-CM | POA: Diagnosis not present

## 2019-12-17 DIAGNOSIS — R41841 Cognitive communication deficit: Secondary | ICD-10-CM | POA: Diagnosis not present

## 2019-12-17 NOTE — Assessment & Plan Note (Signed)
Hospital stay 11/02/19-11/11/19 for acute CVA (MRI cevebral small acute infarcts in the posterior left hemisphere in the left PCA and MCA watershed area. No significant carotid stenosis, Echo EF 02-98%), embolic etiology

## 2019-12-17 NOTE — Assessment & Plan Note (Signed)
Stable, continue Omeprazole.  

## 2019-12-17 NOTE — Assessment & Plan Note (Addendum)
Not apparent.  

## 2019-12-17 NOTE — Assessment & Plan Note (Signed)
Her mood is stable, continue Sertraline.  

## 2019-12-17 NOTE — Assessment & Plan Note (Addendum)
Heart rate is in control, continue Amiodarone, Eliquis. The patient appears pale to me, Appears pale to me, the patient denied generalized weakness, dizziness, chest pain/pressure, palpitation, abd pain, nausea, vomiting.  FOBT x3, CBC/diff, CMP/eGFR

## 2019-12-18 DIAGNOSIS — R2681 Unsteadiness on feet: Secondary | ICD-10-CM | POA: Diagnosis not present

## 2019-12-18 DIAGNOSIS — R41842 Visuospatial deficit: Secondary | ICD-10-CM | POA: Diagnosis not present

## 2019-12-18 DIAGNOSIS — R41841 Cognitive communication deficit: Secondary | ICD-10-CM | POA: Diagnosis not present

## 2019-12-18 DIAGNOSIS — M6281 Muscle weakness (generalized): Secondary | ICD-10-CM | POA: Diagnosis not present

## 2019-12-18 DIAGNOSIS — R1312 Dysphagia, oropharyngeal phase: Secondary | ICD-10-CM | POA: Diagnosis not present

## 2019-12-18 DIAGNOSIS — N3946 Mixed incontinence: Secondary | ICD-10-CM | POA: Diagnosis not present

## 2019-12-18 DIAGNOSIS — I10 Essential (primary) hypertension: Secondary | ICD-10-CM | POA: Diagnosis not present

## 2019-12-21 DIAGNOSIS — R2681 Unsteadiness on feet: Secondary | ICD-10-CM | POA: Diagnosis not present

## 2019-12-21 DIAGNOSIS — M6281 Muscle weakness (generalized): Secondary | ICD-10-CM | POA: Diagnosis not present

## 2019-12-21 DIAGNOSIS — N3946 Mixed incontinence: Secondary | ICD-10-CM | POA: Diagnosis not present

## 2019-12-21 DIAGNOSIS — R41842 Visuospatial deficit: Secondary | ICD-10-CM | POA: Diagnosis not present

## 2019-12-21 DIAGNOSIS — R1312 Dysphagia, oropharyngeal phase: Secondary | ICD-10-CM | POA: Diagnosis not present

## 2019-12-21 DIAGNOSIS — R41841 Cognitive communication deficit: Secondary | ICD-10-CM | POA: Diagnosis not present

## 2019-12-22 DIAGNOSIS — R1312 Dysphagia, oropharyngeal phase: Secondary | ICD-10-CM | POA: Diagnosis not present

## 2019-12-22 DIAGNOSIS — R41841 Cognitive communication deficit: Secondary | ICD-10-CM | POA: Diagnosis not present

## 2019-12-22 DIAGNOSIS — R2681 Unsteadiness on feet: Secondary | ICD-10-CM | POA: Diagnosis not present

## 2019-12-22 DIAGNOSIS — N3946 Mixed incontinence: Secondary | ICD-10-CM | POA: Diagnosis not present

## 2019-12-22 DIAGNOSIS — R41842 Visuospatial deficit: Secondary | ICD-10-CM | POA: Diagnosis not present

## 2019-12-22 DIAGNOSIS — M6281 Muscle weakness (generalized): Secondary | ICD-10-CM | POA: Diagnosis not present

## 2019-12-23 DIAGNOSIS — R41841 Cognitive communication deficit: Secondary | ICD-10-CM | POA: Diagnosis not present

## 2019-12-23 DIAGNOSIS — N3946 Mixed incontinence: Secondary | ICD-10-CM | POA: Diagnosis not present

## 2019-12-23 DIAGNOSIS — R1312 Dysphagia, oropharyngeal phase: Secondary | ICD-10-CM | POA: Diagnosis not present

## 2019-12-23 DIAGNOSIS — R41842 Visuospatial deficit: Secondary | ICD-10-CM | POA: Diagnosis not present

## 2019-12-23 DIAGNOSIS — M6281 Muscle weakness (generalized): Secondary | ICD-10-CM | POA: Diagnosis not present

## 2019-12-23 DIAGNOSIS — R2681 Unsteadiness on feet: Secondary | ICD-10-CM | POA: Diagnosis not present

## 2019-12-24 DIAGNOSIS — R41842 Visuospatial deficit: Secondary | ICD-10-CM | POA: Diagnosis not present

## 2019-12-24 DIAGNOSIS — R41841 Cognitive communication deficit: Secondary | ICD-10-CM | POA: Diagnosis not present

## 2019-12-24 DIAGNOSIS — N3946 Mixed incontinence: Secondary | ICD-10-CM | POA: Diagnosis not present

## 2019-12-24 DIAGNOSIS — R1312 Dysphagia, oropharyngeal phase: Secondary | ICD-10-CM | POA: Diagnosis not present

## 2019-12-24 DIAGNOSIS — R2681 Unsteadiness on feet: Secondary | ICD-10-CM | POA: Diagnosis not present

## 2019-12-24 DIAGNOSIS — M6281 Muscle weakness (generalized): Secondary | ICD-10-CM | POA: Diagnosis not present

## 2019-12-25 DIAGNOSIS — M6281 Muscle weakness (generalized): Secondary | ICD-10-CM | POA: Diagnosis not present

## 2019-12-25 DIAGNOSIS — R41842 Visuospatial deficit: Secondary | ICD-10-CM | POA: Diagnosis not present

## 2019-12-25 DIAGNOSIS — N3946 Mixed incontinence: Secondary | ICD-10-CM | POA: Diagnosis not present

## 2019-12-25 DIAGNOSIS — R2681 Unsteadiness on feet: Secondary | ICD-10-CM | POA: Diagnosis not present

## 2019-12-25 DIAGNOSIS — R1312 Dysphagia, oropharyngeal phase: Secondary | ICD-10-CM | POA: Diagnosis not present

## 2019-12-25 DIAGNOSIS — R41841 Cognitive communication deficit: Secondary | ICD-10-CM | POA: Diagnosis not present

## 2019-12-28 ENCOUNTER — Ambulatory Visit (INDEPENDENT_AMBULATORY_CARE_PROVIDER_SITE_OTHER): Payer: Medicare Other | Admitting: Neurology

## 2019-12-28 ENCOUNTER — Other Ambulatory Visit: Payer: Self-pay

## 2019-12-28 VITALS — BP 135/60 | HR 51 | Ht <= 58 in | Wt 85.0 lb

## 2019-12-28 DIAGNOSIS — F0150 Vascular dementia without behavioral disturbance: Secondary | ICD-10-CM | POA: Diagnosis not present

## 2019-12-28 DIAGNOSIS — M6281 Muscle weakness (generalized): Secondary | ICD-10-CM | POA: Diagnosis not present

## 2019-12-28 DIAGNOSIS — R41842 Visuospatial deficit: Secondary | ICD-10-CM | POA: Diagnosis not present

## 2019-12-28 DIAGNOSIS — R1312 Dysphagia, oropharyngeal phase: Secondary | ICD-10-CM | POA: Diagnosis not present

## 2019-12-28 DIAGNOSIS — I63532 Cerebral infarction due to unspecified occlusion or stenosis of left posterior cerebral artery: Secondary | ICD-10-CM

## 2019-12-28 DIAGNOSIS — R41841 Cognitive communication deficit: Secondary | ICD-10-CM | POA: Diagnosis not present

## 2019-12-28 DIAGNOSIS — H53461 Homonymous bilateral field defects, right side: Secondary | ICD-10-CM | POA: Diagnosis not present

## 2019-12-28 DIAGNOSIS — R2681 Unsteadiness on feet: Secondary | ICD-10-CM | POA: Diagnosis not present

## 2019-12-28 DIAGNOSIS — N3946 Mixed incontinence: Secondary | ICD-10-CM | POA: Diagnosis not present

## 2019-12-28 NOTE — Progress Notes (Signed)
[facility] [address] [place]. [place] [address] [phone]       OFFICE CONSULT NOTE  Bianca [name] Date of Birth:  10-02-1922 Medical Record Number:  [id number]   Referring MD:  Bianca [name]  Reason for Referral: Stroke HPI: Bianca [name] is a pleasant 86 year old Caucasian lady seen today for initial office consultation visit for stroke.  History is obtained from her and review of electronic medical records and I personally reviewed available imaging films in PACS.  She has past medical history of gastroesophageal reflux disease, SVT and metoprolol presented to [facility] on 11/02/2019 with transient bradycardia in the setting of beta-blockers and subsequently found to have a flutter fib.  She developed confusion while in the hospital and hence MRI scan was obtained which showed several scattered small acute infarcts in the posterior left hemisphere involving mostly the left PCA/MCA watershed territory.  There is underlying age-related changes of small vessel disease.  MR angiogram of the brain was normal.  Transthoracic echo showed normal ejection fraction without cardiac source of embolism.  Carotid ultrasound showed no significant extracranial stenosis.  LDL cholesterol 72 mg percent.  Hemoglobin A1c was 5.9.  Cardiology was consulted and recommended Eliquis 2.5 twice daily for atrial flutter/fib.  She is currently living in [place] on [place] in assisted living section.  She is able to walk with a wheeled walker but requires 1 person assist.  She is extremely scared of falling.  She had some baseline dementia which seems to have gotten worse since her stroke.  She gets confused quite easily and has worsening memory difficulties.  She is not aware of any peripheral vision deficits.  She has had no falls or injuries.  She is tolerating Eliquis without bleeding or bruising.  On the Mini-Mental status exam testing she scored 18/30 today.  It is unclear to me whether she  has had previous work-up for reversible causes of memory loss or trials of medications like Aricept or Namenda and the paperwork that was sent does not mention this.  ROS:   14 system review of systems is positive for memory loss, dementia, gait difficulty, imbalance, decreased hearing, vision loss and all other systems negative  PMH:  Past Medical History:  Diagnosis Date  . COVID-19   . 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 ([place])     Social History:  Social History   Socioeconomic History  . Marital status: Widowed    Spouse name: Not on file  . Number of children: Not on file  . Years of education: Not on file  . Highest education level: Not on file  Occupational History  . Not on file  Tobacco Use  . Smoking status: Never Smoker  . Smokeless tobacco: Never Used  Vaping Use  . Vaping Use: Never used  Substance and Sexual Activity  . Alcohol use: Never  . Drug use: Never  . Sexual activity: Not on file  Other Topics Concern  . Not on file  Social History Narrative  . Not on file   Social Determinants of Health   Financial Resource Strain:   . Difficulty of Paying Living Expenses: Not on file  Food Insecurity:   . Worried About Charity fundraiser in the Last Year: Not on file  . Ran Out of Food in the Last Year: Not on file  Transportation Needs:   . Lack of Transportation (Medical): Not on file  . Lack of  Transportation (Non-Medical): Not on file  Physical Activity:   . Days of Exercise per Week: Not on file  . Minutes of Exercise per Session: Not on file  Stress:   . Feeling of Stress : Not on file  Social Connections:   . Frequency of Communication with Friends and Family: Not on file  . Frequency of Social Gatherings with Friends and Family: Not on file  . Attends Religious Services: Not on file  . Active Member of Clubs or Organizations: Not on file  . Attends Archivist Meetings: Not on  file  . Marital Status: Not on file  Intimate Partner Violence:   . Fear of Current or Ex-Partner: Not on file  . Emotionally Abused: Not on file  . Physically Abused: Not on file  . Sexually Abused: Not on file    Medications:   Current Outpatient Medications on File Prior to Visit  Medication Sig Dispense Refill  . acetaminophen (TYLENOL) 325 MG tablet Take 650 mg by mouth every 6 (six) hours as needed for mild pain or headache.     Marland Kitchen amiodarone (PACERONE) 100 MG tablet Take 1 tablet (100 mg total) by mouth daily. 30 tablet 1  . apixaban (ELIQUIS) 2.5 MG TABS tablet Take 1 tablet (2.5 mg total) by mouth 2 (two) times daily. 60 tablet 1  . atorvastatin (LIPITOR) 20 MG tablet Take 1 tablet (20 mg total) by mouth daily at 6 PM. 30 tablet 1  . b complex vitamins tablet Take 1 tablet by mouth daily.     Marland Kitchen bismuth subsalicylate (PEPTO BISMOL) 262 MG/15ML suspension Take 60 mLs by mouth daily as needed for indigestion or diarrhea or loose stools.     . Calcium Carbonate-Vitamin D 600-400 MG-UNIT tablet Take 1 tablet by mouth daily.    . diphenoxylate-atropine (LOMOTIL) 2.5-0.025 MG tablet Take by mouth daily as needed for diarrhea or loose stools.    . Foot Care Products MISC by Does not apply route. Gel bunion cushion once a day on Wednesday    . lactose free nutrition (BOOST) LIQD Take 237 mLs by mouth daily.    . melatonin 3 MG TABS tablet Take 3 mg by mouth at bedtime.     . Multiple Vitamins-Minerals (CENTRUM SILVER PO) Take 1 tablet by mouth daily. 7am-10am.    . NON FORMULARY Moisturizing Cream to extremities with morning and evening care Every Shift    . NON FORMULARY Regular Special Instructions: Regular Diet, Thin Liquids    . omeprazole (PRILOSEC) 20 MG capsule Take 20 mg by mouth daily.     . sertraline (ZOLOFT) 25 MG tablet Take 0.5 tablets (12.5 mg total) by mouth at bedtime. 30 tablet 0  . timolol (BETIMOL) 0.5 % ophthalmic solution Place 1 drop into both eyes daily. 7am-10am.      No current facility-administered medications on file prior to visit.    Allergies:   Allergies  Allergen Reactions  . Azopt [Brinzolamide] Other (See Comments)    UNK  . Brimonidine Other (See Comments)    UNK  . Lumigan [Bimatoprost] Other (See Comments)    UNK    Physical Exam General: Frail elderly Caucasian lady, seated, in no evident distress Head: head normocephalic and atraumatic.   Neck: supple with no carotid or supraclavicular bruits Cardiovascular: irregular rate and rhythm, no murmurs Musculoskeletal: no deformity Skin:  no rash/petichiae Vascular:  Normal pulses all extremities  Neurologic Exam Mental Status: Awake and fully alert. Oriented to place only  recent and remote memory poor. Attention span, concentration and fund of knowledge diminished mood and affect appropriate.  Mini-Mental status exam scored 18/30 with deficits in orientation drawing and recall.  Unable to copy intersecting pentagons.  Unable to name animals which can walk on 4 legs.  Clock drawing 1/4. Cranial Nerves: Fundoscopic exam reveals sharp disc margins. Pupils equal, briskly reactive to light. Extraocular movements full without nystagmus. Visual fields full to confrontation. Hearing diminished bilaterally facial sensation intact. Face, tongue, palate moves normally and symmetrically.  Motor: Diminished l bulk and tone. Normal strength in all tested extremity muscles. Sensory.: intact to touch , pinprick , position and vibratory sensation.  Coordination: Rapid alternating movements normal in all extremities. Finger-to-nose and heel-to-shin performed accurately bilaterally. Gait and Station: Arises from chair with  difficulty. Stance is stooped.uses a wheeled walker and walks with shot steps and mild ataxia with 1 person assist  reflexes: 1+ and symmetric. Toes downgoing.   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  Vascular risk factors of atrial flutter/fibrillation and age     PLAN: I had a long d/w patient and her SNF transporter about her recent stroke,atrial flutter-Fibrillation, dementia,, risk for recurrent stroke/TIAs, personally independently reviewed imaging studies and stroke evaluation results and answered questions.Continue Eliquis 2.5 mg twice daily  for secondary stroke prevention and maintain strict control of hypertension with blood pressure goal below 130/90, diabetes with hemoglobin A1c goal below 6.5% and lipids with LDL cholesterol goal below 70 mg/dL. I also advised the patient to eat a healthy diet with plenty of whole grains, cereals, fruits and vegetables, exercise regularly and maintain ideal body weight .greater than 50% time during this 45-minute consultation visit was spent in counseling and coordination of care about her dementia and embolic stroke and vision loss and discussion with care team and answering questions followup in the future with me only if necessary and no scheduled appointment was made. Antony Contras, MD  [facility] [address] [place], [place] [address]  Phone [phone] Fax [phone]: This document was prepared with digital dictation and possible smart phrase technology. Any transcriptional errors that result from this process are unintentional.

## 2019-12-28 NOTE — Patient Instructions (Signed)
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 I also advised the patient to eat a healthy diet with plenty of whole grains, cereals, fruits and vegetables, exercise regularly and maintain ideal body weight Followup in the future with me only if necessary and no scheduled appointment was made.  Stroke Prevention Some medical conditions and behaviors are associated with a higher chance of having a stroke. You can help prevent a stroke by making nutrition, lifestyle, and other changes, including managing any medical conditions you may have. What nutrition changes can be made?   Eat healthy foods. You can do this by: ? Choosing foods high in fiber, such as fresh fruits and vegetables and whole grains. ? Eating at least 5 or more servings of fruits and vegetables a day. Try to fill half of your plate at each meal with fruits and vegetables. ? Choosing lean protein foods, such as lean cuts of meat, poultry without skin, fish, tofu, beans, and nuts. ? Eating low-fat dairy products. ? Avoiding foods that are high in salt (sodium). This can help lower blood pressure. ? Avoiding foods that have saturated fat, trans fat, and cholesterol. This can help prevent high cholesterol. ? Avoiding processed and premade foods.  Follow your health care provider's specific guidelines for losing weight, controlling high blood pressure (hypertension), lowering high cholesterol, and managing diabetes. These may include: ? Reducing your daily calorie intake. ? Limiting your daily  sodium intake to 1,500 milligrams (mg). ? Using only healthy fats for cooking, such as olive oil, canola oil, or sunflower oil. ? Counting your daily carbohydrate intake. What lifestyle changes can be made?  Maintain a healthy weight. Talk to your health care provider about your ideal weight.  Get at least 30 minutes of moderate physical activity at least 5 days a week. Moderate activity includes brisk walking, biking, and swimming.  Do not use any products that contain nicotine or tobacco, such as cigarettes and e-cigarettes. If you need help quitting, ask your health care provider. It may also be helpful to avoid exposure to secondhand smoke.  Limit alcohol intake to no more than 1 drink a day for nonpregnant women and 2 drinks a day for men. One drink equals 12 oz of beer, 5 oz of wine, or 1 oz of hard liquor.  Stop any illegal drug use.  Avoid taking birth control pills. Talk to your health care provider about the risks of taking birth control pills if: ? You are over 47 years old. ? You smoke. ? You get migraines. ? You have ever had a blood clot. What other changes can be made?  Manage your cholesterol levels. ? Eating a healthy diet is important for preventing high cholesterol. If cholesterol cannot be managed through diet alone, you may also need to take medicines. ? Take any prescribed medicines to control your cholesterol as told by your health care provider.  Manage your diabetes. ? Eating a healthy diet and exercising regularly are important parts of managing your blood sugar. If your blood sugar cannot be managed through diet and exercise, you may need to take medicines. ? Take any prescribed medicines  to control your diabetes as told by your health care provider.  Control your hypertension. ? To reduce your risk of stroke, try to keep your blood pressure below 130/80. ? Eating a healthy diet and exercising regularly are an important part of controlling your blood  pressure. If your blood pressure cannot be managed through diet and exercise, you may need to take medicines. ? Take any prescribed medicines to control hypertension as told by your health care provider. ? Ask your health care provider if you should monitor your blood pressure at home. ? Have your blood pressure checked every year, even if your blood pressure is normal. Blood pressure increases with age and some medical conditions.  Get evaluated for sleep disorders (sleep apnea). Talk to your health care provider about getting a sleep evaluation if you snore a lot or have excessive sleepiness.  Take over-the-counter and prescription medicines only as told by your health care provider. Aspirin or blood thinners (antiplatelets or anticoagulants) may be recommended to reduce your risk of forming blood clots that can lead to stroke.  Make sure that any other medical conditions you have, such as atrial fibrillation or atherosclerosis, are managed. What are the warning signs of a stroke? The warning signs of a stroke can be easily remembered as BEFAST.  B is for balance. Signs include: ? Dizziness. ? Loss of balance or coordination. ? Sudden trouble walking.  E is for eyes. Signs include: ? A sudden change in vision. ? Trouble seeing.  F is for face. Signs include: ? Sudden weakness or numbness of the face. ? The face or eyelid drooping to one side.  A is for arms. Signs include: ? Sudden weakness or numbness of the arm, usually on one side of the body.  S is for speech. Signs include: ? Trouble speaking (aphasia). ? Trouble understanding.  T is for time. ? These symptoms may represent a serious problem that is an emergency. Do not wait to see if the symptoms will go away. Get medical help right away. Call your local emergency services (911 in the [place] Do not drive yourself to the hospital.  Other signs of stroke may include: ? A sudden, severe headache with no known  cause. ? Nausea or vomiting. ? Seizure. Where to find more information For more information, visit:  American Stroke Association: www.strokeassociation.org  National Stroke Association: www.stroke.org Summary  You can prevent a stroke by eating healthy, exercising, not smoking, limiting alcohol intake, and managing any medical conditions you may have.  Do not use any products that contain nicotine or tobacco, such as cigarettes and e-cigarettes. If you need help quitting, ask your health care provider. It may also be helpful to avoid exposure to secondhand smoke.  Remember BEFAST for warning signs of stroke. Get help right away if you or a loved one has any of these signs. This information is not intended to replace advice given to you by your health care provider. Make sure you discuss any questions you have with your health care provider. Document Revised: 03/08/2017 Document Reviewed: 05/01/2016 Elsevier Patient Education  2020 [employer].

## 2019-12-29 DIAGNOSIS — R1312 Dysphagia, oropharyngeal phase: Secondary | ICD-10-CM | POA: Diagnosis not present

## 2019-12-29 DIAGNOSIS — R41842 Visuospatial deficit: Secondary | ICD-10-CM | POA: Diagnosis not present

## 2019-12-29 DIAGNOSIS — R41841 Cognitive communication deficit: Secondary | ICD-10-CM | POA: Diagnosis not present

## 2019-12-29 DIAGNOSIS — R2681 Unsteadiness on feet: Secondary | ICD-10-CM | POA: Diagnosis not present

## 2019-12-29 DIAGNOSIS — N3946 Mixed incontinence: Secondary | ICD-10-CM | POA: Diagnosis not present

## 2019-12-29 DIAGNOSIS — M6281 Muscle weakness (generalized): Secondary | ICD-10-CM | POA: Diagnosis not present

## 2019-12-31 DIAGNOSIS — R41841 Cognitive communication deficit: Secondary | ICD-10-CM | POA: Diagnosis not present

## 2019-12-31 DIAGNOSIS — R2681 Unsteadiness on feet: Secondary | ICD-10-CM | POA: Diagnosis not present

## 2019-12-31 DIAGNOSIS — R41842 Visuospatial deficit: Secondary | ICD-10-CM | POA: Diagnosis not present

## 2019-12-31 DIAGNOSIS — M6281 Muscle weakness (generalized): Secondary | ICD-10-CM | POA: Diagnosis not present

## 2019-12-31 DIAGNOSIS — N3946 Mixed incontinence: Secondary | ICD-10-CM | POA: Diagnosis not present

## 2019-12-31 DIAGNOSIS — R1312 Dysphagia, oropharyngeal phase: Secondary | ICD-10-CM | POA: Diagnosis not present

## 2020-01-01 DIAGNOSIS — R41842 Visuospatial deficit: Secondary | ICD-10-CM | POA: Diagnosis not present

## 2020-01-01 DIAGNOSIS — N3946 Mixed incontinence: Secondary | ICD-10-CM | POA: Diagnosis not present

## 2020-01-01 DIAGNOSIS — R1312 Dysphagia, oropharyngeal phase: Secondary | ICD-10-CM | POA: Diagnosis not present

## 2020-01-01 DIAGNOSIS — R2681 Unsteadiness on feet: Secondary | ICD-10-CM | POA: Diagnosis not present

## 2020-01-01 DIAGNOSIS — R41841 Cognitive communication deficit: Secondary | ICD-10-CM | POA: Diagnosis not present

## 2020-01-01 DIAGNOSIS — M6281 Muscle weakness (generalized): Secondary | ICD-10-CM | POA: Diagnosis not present

## 2020-01-05 DIAGNOSIS — M6281 Muscle weakness (generalized): Secondary | ICD-10-CM | POA: Diagnosis not present

## 2020-01-05 DIAGNOSIS — R41842 Visuospatial deficit: Secondary | ICD-10-CM | POA: Diagnosis not present

## 2020-01-05 DIAGNOSIS — N3946 Mixed incontinence: Secondary | ICD-10-CM | POA: Diagnosis not present

## 2020-01-05 DIAGNOSIS — R1312 Dysphagia, oropharyngeal phase: Secondary | ICD-10-CM | POA: Diagnosis not present

## 2020-01-05 DIAGNOSIS — R2681 Unsteadiness on feet: Secondary | ICD-10-CM | POA: Diagnosis not present

## 2020-01-05 DIAGNOSIS — R41841 Cognitive communication deficit: Secondary | ICD-10-CM | POA: Diagnosis not present

## 2020-01-06 DIAGNOSIS — R2681 Unsteadiness on feet: Secondary | ICD-10-CM | POA: Diagnosis not present

## 2020-01-06 DIAGNOSIS — M6281 Muscle weakness (generalized): Secondary | ICD-10-CM | POA: Diagnosis not present

## 2020-01-06 DIAGNOSIS — R41841 Cognitive communication deficit: Secondary | ICD-10-CM | POA: Diagnosis not present

## 2020-01-06 DIAGNOSIS — R41842 Visuospatial deficit: Secondary | ICD-10-CM | POA: Diagnosis not present

## 2020-01-06 DIAGNOSIS — R1312 Dysphagia, oropharyngeal phase: Secondary | ICD-10-CM | POA: Diagnosis not present

## 2020-01-06 DIAGNOSIS — N3946 Mixed incontinence: Secondary | ICD-10-CM | POA: Diagnosis not present

## 2020-01-08 DIAGNOSIS — I639 Cerebral infarction, unspecified: Secondary | ICD-10-CM | POA: Diagnosis not present

## 2020-01-08 DIAGNOSIS — M6281 Muscle weakness (generalized): Secondary | ICD-10-CM | POA: Diagnosis not present

## 2020-01-08 DIAGNOSIS — R4182 Altered mental status, unspecified: Secondary | ICD-10-CM | POA: Diagnosis not present

## 2020-01-08 DIAGNOSIS — N3946 Mixed incontinence: Secondary | ICD-10-CM | POA: Diagnosis not present

## 2020-01-09 ENCOUNTER — Emergency Department (HOSPITAL_COMMUNITY): Payer: Medicare Other

## 2020-01-09 ENCOUNTER — Inpatient Hospital Stay (HOSPITAL_COMMUNITY)
Admission: EM | Admit: 2020-01-09 | Discharge: 2020-01-12 | DRG: 982 | Disposition: A | Discharge status: Skilled Nursing Facility | Payer: Medicare Other | Attending: Family Medicine | Admitting: Family Medicine

## 2020-01-09 ENCOUNTER — Encounter (HOSPITAL_COMMUNITY): Payer: Self-pay

## 2020-01-09 DIAGNOSIS — Z79899 Other long term (current) drug therapy: Secondary | ICD-10-CM

## 2020-01-09 DIAGNOSIS — R55 Syncope and collapse: Secondary | ICD-10-CM

## 2020-01-09 DIAGNOSIS — Z20822 Contact with and (suspected) exposure to covid-19: Secondary | ICD-10-CM | POA: Diagnosis present

## 2020-01-09 DIAGNOSIS — S91011A Laceration without foreign body, right ankle, initial encounter: Secondary | ICD-10-CM | POA: Diagnosis present

## 2020-01-09 DIAGNOSIS — W19XXXA Unspecified fall, initial encounter: Secondary | ICD-10-CM | POA: Diagnosis present

## 2020-01-09 DIAGNOSIS — W1811XA Fall from or off toilet without subsequent striking against object, initial encounter: Secondary | ICD-10-CM | POA: Diagnosis present

## 2020-01-09 DIAGNOSIS — Z8616 Personal history of COVID-19: Secondary | ICD-10-CM

## 2020-01-09 DIAGNOSIS — H353231 Exudative age-related macular degeneration, bilateral, with active choroidal neovascularization: Secondary | ICD-10-CM | POA: Diagnosis present

## 2020-01-09 DIAGNOSIS — I69391 Dysphagia following cerebral infarction: Secondary | ICD-10-CM

## 2020-01-09 DIAGNOSIS — R404 Transient alteration of awareness: Secondary | ICD-10-CM | POA: Diagnosis not present

## 2020-01-09 DIAGNOSIS — Z043 Encounter for examination and observation following other accident: Secondary | ICD-10-CM | POA: Diagnosis not present

## 2020-01-09 DIAGNOSIS — I484 Atypical atrial flutter: Secondary | ICD-10-CM | POA: Diagnosis not present

## 2020-01-09 DIAGNOSIS — Z66 Do not resuscitate: Secondary | ICD-10-CM | POA: Diagnosis present

## 2020-01-09 DIAGNOSIS — Z8701 Personal history of pneumonia (recurrent): Secondary | ICD-10-CM

## 2020-01-09 DIAGNOSIS — S81811A Laceration without foreign body, right lower leg, initial encounter: Secondary | ICD-10-CM | POA: Diagnosis not present

## 2020-01-09 DIAGNOSIS — E440 Moderate protein-calorie malnutrition: Secondary | ICD-10-CM | POA: Diagnosis present

## 2020-01-09 DIAGNOSIS — I480 Paroxysmal atrial fibrillation: Secondary | ICD-10-CM | POA: Diagnosis present

## 2020-01-09 DIAGNOSIS — S0990XA Unspecified injury of head, initial encounter: Secondary | ICD-10-CM | POA: Diagnosis not present

## 2020-01-09 DIAGNOSIS — H53461 Homonymous bilateral field defects, right side: Secondary | ICD-10-CM | POA: Diagnosis present

## 2020-01-09 DIAGNOSIS — N182 Chronic kidney disease, stage 2 (mild): Secondary | ICD-10-CM | POA: Diagnosis present

## 2020-01-09 DIAGNOSIS — Y92091 Bathroom in other non-institutional residence as the place of occurrence of the external cause: Secondary | ICD-10-CM

## 2020-01-09 DIAGNOSIS — E785 Hyperlipidemia, unspecified: Secondary | ICD-10-CM | POA: Diagnosis present

## 2020-01-09 DIAGNOSIS — S0003XA Contusion of scalp, initial encounter: Secondary | ICD-10-CM | POA: Diagnosis present

## 2020-01-09 DIAGNOSIS — I69398 Other sequelae of cerebral infarction: Secondary | ICD-10-CM

## 2020-01-09 DIAGNOSIS — Z681 Body mass index (BMI) 19 or less, adult: Secondary | ICD-10-CM | POA: Diagnosis not present

## 2020-01-09 DIAGNOSIS — K219 Gastro-esophageal reflux disease without esophagitis: Secondary | ICD-10-CM | POA: Diagnosis present

## 2020-01-09 DIAGNOSIS — E876 Hypokalemia: Secondary | ICD-10-CM | POA: Diagnosis not present

## 2020-01-09 DIAGNOSIS — Z888 Allergy status to other drugs, medicaments and biological substances status: Secondary | ICD-10-CM

## 2020-01-09 DIAGNOSIS — E1165 Type 2 diabetes mellitus with hyperglycemia: Secondary | ICD-10-CM | POA: Diagnosis not present

## 2020-01-09 DIAGNOSIS — J439 Emphysema, unspecified: Secondary | ICD-10-CM | POA: Diagnosis present

## 2020-01-09 DIAGNOSIS — R1310 Dysphagia, unspecified: Secondary | ICD-10-CM | POA: Diagnosis present

## 2020-01-09 DIAGNOSIS — F0390 Unspecified dementia without behavioral disturbance: Secondary | ICD-10-CM | POA: Diagnosis present

## 2020-01-09 DIAGNOSIS — I4581 Long QT syndrome: Principal | ICD-10-CM | POA: Diagnosis present

## 2020-01-09 DIAGNOSIS — L89210 Pressure ulcer of right hip, unstageable: Secondary | ICD-10-CM | POA: Diagnosis present

## 2020-01-09 DIAGNOSIS — R9431 Abnormal electrocardiogram [ECG] [EKG]: Secondary | ICD-10-CM

## 2020-01-09 DIAGNOSIS — Z7901 Long term (current) use of anticoagulants: Secondary | ICD-10-CM

## 2020-01-09 LAB — CBC WITH DIFFERENTIAL/PLATELET
Abs Immature Granulocytes: 0.04 10*3/uL (ref 0.00–0.07)
Basophils Absolute: 0 10*3/uL (ref 0.0–0.1)
Basophils Relative: 0 %
Eosinophils Absolute: 0.3 10*3/uL (ref 0.0–0.5)
Eosinophils Relative: 4 %
HCT: 34.4 % — ABNORMAL LOW (ref 36.0–46.0)
Hemoglobin: 11 g/dL — ABNORMAL LOW (ref 12.0–15.0)
Immature Granulocytes: 1 %
Lymphocytes Relative: 11 %
Lymphs Abs: 0.7 10*3/uL (ref 0.7–4.0)
MCH: 31.9 pg (ref 26.0–34.0)
MCHC: 32 g/dL (ref 30.0–36.0)
MCV: 99.7 fL (ref 80.0–100.0)
Monocytes Absolute: 0.7 10*3/uL (ref 0.1–1.0)
Monocytes Relative: 10 %
Neutro Abs: 5.1 10*3/uL (ref 1.7–7.7)
Neutrophils Relative %: 74 %
Platelets: 305 10*3/uL (ref 150–400)
RBC: 3.45 MIL/uL — ABNORMAL LOW (ref 3.87–5.11)
RDW: 18.5 % — ABNORMAL HIGH (ref 11.5–15.5)
WBC: 6.9 10*3/uL (ref 4.0–10.5)
nRBC: 0 % (ref 0.0–0.2)

## 2020-01-09 LAB — BASIC METABOLIC PANEL
Anion gap: 9 (ref 5–15)
BUN: 20 mg/dL (ref 8–23)
CO2: 22 mmol/L (ref 22–32)
Calcium: 8.1 mg/dL — ABNORMAL LOW (ref 8.9–10.3)
Chloride: 107 mmol/L (ref 98–111)
Creatinine, Ser: 0.81 mg/dL (ref 0.44–1.00)
GFR calc Af Amer: >60 mL/min (ref >60)
GFR calc non Af Amer: >60 mL/min (ref >60)
Glucose, Bld: 101 mg/dL — ABNORMAL HIGH (ref 70–99)
Potassium: 3.3 mmol/L — ABNORMAL LOW (ref 3.5–5.1)
Sodium: 138 mmol/L (ref 135–145)

## 2020-01-09 MED ORDER — PANTOPRAZOLE SODIUM 40 MG PO TBEC
40.0000 mg | DELAYED_RELEASE_TABLET | Freq: Every day | ORAL | Status: DC
  Administered 2020-01-10 – 2020-01-12 (×3): 40 mg via ORAL
  Filled 2020-01-09 (×3): qty 1

## 2020-01-09 MED ORDER — POTASSIUM CHLORIDE CRYS ER 20 MEQ PO TBCR
40.0000 meq | EXTENDED_RELEASE_TABLET | Freq: Two times a day (BID) | ORAL | Status: AC
  Administered 2020-01-10 (×2): 40 meq via ORAL
  Filled 2020-01-09 (×2): qty 2

## 2020-01-09 MED ORDER — AMIODARONE HCL 200 MG PO TABS: 100.0000 mg | ORAL_TABLET | Freq: Every day | ORAL | Status: DC

## 2020-01-09 MED ORDER — ACETAMINOPHEN 325 MG PO TABS
650.0000 mg | ORAL_TABLET | Freq: Four times a day (QID) | ORAL | Status: DC | PRN
  Administered 2020-01-11: 650 mg via ORAL
  Filled 2020-01-09: qty 2

## 2020-01-09 MED ORDER — SODIUM CHLORIDE 0.9% FLUSH
3.0000 mL | Freq: Two times a day (BID) | INTRAVENOUS | Status: DC
  Administered 2020-01-10 – 2020-01-12 (×5): 3 mL via INTRAVENOUS

## 2020-01-09 MED ORDER — TIMOLOL MALEATE 0.5 % OP SOLN
1.0000 [drp] | Freq: Every day | OPHTHALMIC | Status: DC
  Administered 2020-01-11 – 2020-01-12 (×2): 1 [drp] via OPHTHALMIC
  Filled 2020-01-09 (×2): qty 5

## 2020-01-09 MED ORDER — APIXABAN 2.5 MG PO TABS
2.5000 mg | ORAL_TABLET | Freq: Two times a day (BID) | ORAL | Status: DC
  Administered 2020-01-10 – 2020-01-12 (×5): 2.5 mg via ORAL
  Filled 2020-01-09 (×8): qty 1

## 2020-01-09 MED ORDER — BOOST PO LIQD
237.0000 mL | Freq: Every day | ORAL | Status: DC
  Administered 2020-01-10 – 2020-01-12 (×3): 237 mL via ORAL
  Filled 2020-01-09 (×3): qty 237

## 2020-01-09 MED ORDER — LIDOCAINE-EPINEPHRINE 1 %-1:100000 IJ SOLN
10.0000 mL | Freq: Once | INTRAMUSCULAR | Status: AC
  Administered 2020-01-09: 10 mL
  Filled 2020-01-09: qty 1

## 2020-01-09 MED ORDER — MELATONIN 3 MG PO TABS
3.0000 mg | ORAL_TABLET | Freq: Every day | ORAL | Status: DC
  Administered 2020-01-10 – 2020-01-11 (×3): 3 mg via ORAL
  Filled 2020-01-09 (×4): qty 1

## 2020-01-09 MED ORDER — SERTRALINE HCL 25 MG PO TABS
12.5000 mg | ORAL_TABLET | Freq: Every day | ORAL | Status: DC
  Administered 2020-01-10 – 2020-01-11 (×2): 12.5 mg via ORAL
  Filled 2020-01-09 (×5): qty 0.5

## 2020-01-09 MED ORDER — ATORVASTATIN CALCIUM 10 MG PO TABS
20.0000 mg | ORAL_TABLET | Freq: Every day | ORAL | Status: DC
  Administered 2020-01-10 – 2020-01-11 (×2): 20 mg via ORAL
  Filled 2020-01-09 (×2): qty 2

## 2020-01-09 MED ORDER — ACETAMINOPHEN 650 MG RE SUPP: 650.0000 mg | Freq: Four times a day (QID) | RECTAL | Status: DC | PRN

## 2020-01-09 NOTE — ED Notes (Signed)
Pt next in line for CT

## 2020-01-09 NOTE — ED Triage Notes (Signed)
Pt bib [facility] from assisted living after mechanical fall from seated position. Pt leaned forward while on toilet and lost her balance and fell and hit wall on L side and then hit back of head. Pt has lac to RLE and hematoma on back of head. Pt has hx dementia but is at her neurological baseline per EMS. EMS VSS. Pt AOx2. Pt takes eliquis per EMS. No loc.

## 2020-01-09 NOTE — ED Provider Notes (Signed)
[facility] EMERGENCY DEPARTMENT Provider Note   CSN: [id number] Arrival date & time: 01/09/20  1803     History Chief Complaint  Patient presents with   Bianca [name] is a 86 y.o. female.  Patient with history of dementia, chronic kidney disease, SVT, stroke --presents to the emergency department for evaluation of head injury and right ankle laceration sustained after a fall.  Patient lives at friend's home.  She was on the toilet in a sitting position when she leaned forward and fell.  No reported loss of consciousness.  It was reported that she struck the left side of her body on a wall and then hit the back of her head.  Reported at her neurological baseline.  She is on Eliquis per paperwork.  She is a DNR.  She currently has no complaints including headache, chest pain, pain in her arms or legs.  Level 5 caveat due to dementia.        Past Medical History:  Diagnosis Date   COVID-19    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 [facility])     Patient Active Problem List   Diagnosis Date Noted   Adjustment disorder with mixed anxiety and depressed mood 11/07/2019   Stroke ([place]) 11/05/2019   Tachycardia 11/03/2019   Hypotension 11/03/2019   Sinus bradycardia 11/02/2019   Acute lower UTI 11/02/2019   Memory deficit 10/26/2019   CKD (chronic kidney disease) stage 3, GFR 30-59 ml/min (HCC) 09/30/2019   Edema 06/11/2019   Metatarsalgia 06/11/2019   GERD (gastroesophageal reflux disease) 05/14/2019   Paroxysmal SVT (supraventricular tachycardia) ([place]) 04/27/2019   Pneumonia due to COVID-19 virus 04/27/2019   Chronic diarrhea 04/27/2019   Urinary frequency 04/27/2019   Protein-calorie malnutrition ([place]) 04/27/2019   Emphysema of lung ([place]) 04/27/2019    History reviewed. No pertinent surgical history.   OB History   No obstetric history on file.     No family history on  file.  Social History   Tobacco Use   Smoking status: Never Smoker   Smokeless tobacco: Never Used  Vaping Use   Vaping Use: Never used  Substance Use Topics   Alcohol use: Never   Drug use: Never    Home Medications Prior to Admission medications   Medication Sig Start Date End Date Taking? Authorizing Provider  acetaminophen (TYLENOL) 325 MG tablet Take 650 mg by mouth every 6 (six) hours as needed for mild pain or headache.     [provider]  amiodarone (PACERONE) 100 MG tablet Take 1 tablet (100 mg total) by mouth daily. 11/11/19   Hosie Poisson, MD  apixaban (ELIQUIS) 2.5 MG TABS tablet Take 1 tablet (2.5 mg total) by mouth 2 (two) times daily. 11/10/19   Hosie Poisson, MD  atorvastatin (LIPITOR) 20 MG tablet Take 1 tablet (20 mg total) by mouth daily at 6 PM. 11/10/19   Hosie Poisson, MD  b complex vitamins tablet Take 1 tablet by mouth daily.     [provider]  bismuth subsalicylate (PEPTO BISMOL) 262 MG/15ML suspension Take 60 mLs by mouth daily as needed for indigestion or diarrhea or loose stools.     [provider]  Calcium Carbonate-Vitamin D 600-400 MG-UNIT tablet Take 1 tablet by mouth daily.    [provider]  diphenoxylate-atropine (LOMOTIL) 2.5-0.025 MG tablet Take by mouth daily as needed for diarrhea or loose stools.    [provider]  Foot  Care Products MISC by Does not apply route. Gel bunion cushion once a day on Wednesday    [provider]  lactose free nutrition (BOOST) LIQD Take 237 mLs by mouth daily.    [provider]  melatonin 3 MG TABS tablet Take 3 mg by mouth at bedtime.     [provider]  Multiple Vitamins-Minerals (CENTRUM SILVER PO) Take 1 tablet by mouth daily. 7am-10am.    [provider]  NON FORMULARY Moisturizing Cream to extremities with morning and evening care Every Shift    [provider]  NON FORMULARY Regular Special Instructions: Regular  Diet, Thin Liquids    [provider]  omeprazole (PRILOSEC) 20 MG capsule Take 20 mg by mouth daily.     [provider]  sertraline (ZOLOFT) 25 MG tablet Take 0.5 tablets (12.5 mg total) by mouth at bedtime. 11/10/19   Hosie Poisson, MD  timolol (BETIMOL) 0.5 % ophthalmic solution Place 1 drop into both eyes daily. 7am-10am.    [provider]    Allergies    Azopt [brinzolamide], Brimonidine, and Lumigan [bimatoprost]  Review of Systems   Review of Systems  Unable to perform ROS: Dementia  Respiratory: Negative for shortness of breath.   Cardiovascular: Negative for chest pain.  Musculoskeletal: Negative for myalgias.  Skin: Positive for wound.  Neurological: Negative for headaches.    Physical Exam Updated Vital Signs BP (!) 147/69    Pulse (!) 59    Temp 98.3 F (36.8 C) (Oral)    Resp 20    Ht 5' (1.524 m)    Wt 43.1 kg    SpO2 93%    BMI 18.55 kg/m   Physical Exam Vitals and nursing note reviewed.  Constitutional:      Appearance: She is well-developed.  HENT:     Head: Normocephalic. No raccoon eyes or Battle's sign.     Comments: Patient with 2 cm hematoma to the right occipital scalp with a small overlying abrasion, minimal dried blood, no active bleeding.    Right Ear: Tympanic membrane, ear canal and external ear normal. No hemotympanum.     Left Ear: Tympanic membrane, ear canal and external ear normal. No hemotympanum.     Nose: Nose normal.     Mouth/Throat:     Pharynx: Uvula midline.  Eyes:     General: Lids are normal.     Extraocular Movements:     Right eye: No nystagmus.     Left eye: No nystagmus.     Conjunctiva/sclera: Conjunctivae normal.     Pupils: Pupils are equal, round, and reactive to light.     Comments: No visible hyphema noted  Cardiovascular:     Rate and Rhythm: Normal rate and regular rhythm.  Pulmonary:     Effort: Pulmonary effort is normal.     Breath sounds: Normal breath sounds.  Abdominal:      Palpations: Abdomen is soft.     Tenderness: There is no abdominal tenderness.  Musculoskeletal:     Cervical back: Normal range of motion and neck supple. No tenderness or bony tenderness.     Thoracic back: No tenderness or bony tenderness.     Lumbar back: No tenderness or bony tenderness.  Skin:    General: Skin is warm and dry.     Comments: Patient with a 4 cm gaping laceration to the left anterior ankle.  Wound base is clean.  There is exposed adipose tissue.  No muscle  involvement.  Neurological:     Mental Status: She is alert. She is disoriented.     GCS: GCS eye subscore is 4. GCS verbal subscore is 5. GCS motor subscore is 6.     Cranial Nerves: No cranial nerve deficit.     Sensory: No sensory deficit.     Coordination: Coordination normal.     Deep Tendon Reflexes: Reflexes are normal and symmetric.     Comments: Patient is awake and alert.  She could not tell us the city or year.  She cannot give details regarding the accident.     ED Results / Procedures / Treatments   Labs (all labs ordered are listed, but only abnormal results are displayed) Labs Reviewed  CBC WITH DIFFERENTIAL/PLATELET - Abnormal; Notable for the following components:      Result Value   RBC 3.45 (*)    Hemoglobin 11.0 (*)    HCT 34.4 (*)    RDW 18.5 (*)    All other components within normal limits  BASIC METABOLIC PANEL - Abnormal; Notable for the following components:   Potassium 3.3 (*)    Glucose, Bld 101 (*)    Calcium 8.1 (*)    All other components within normal limits  RESPIRATORY PANEL BY RT PCR (FLU A&B, COVID)  URINALYSIS, ROUTINE W REFLEX MICROSCOPIC  MAGNESIUM  TROPONIN I (HIGH SENSITIVITY)    EKG EKG Interpretation  Date/Time:  Saturday January 09 2020 18:22:39 EDT Ventricular Rate:  59 PR Interval:    QRS Duration: 77 QT Interval:  632 QTC Calculation: 632 R Axis:   -22 Text Interpretation: Sinus rhythm Prolonged PR interval Borderline left axis deviation Low  voltage, extremity and precordial leads Consider anterior infarct Prolonged QT interval - NEW Nonspecific ST and T wave abnormality Confirmed by Varney Biles [phone]) on 01/09/2020 6:33:24 PM   Radiology DG Ankle Complete Right  Result Date: 01/09/2020 CLINICAL DATA:  Right leg laceration. EXAM: RIGHT ANKLE - COMPLETE 3+ VIEW COMPARISON:  None. FINDINGS: There is no evidence of fracture, dislocation, or joint effusion. There is no evidence of arthropathy or other focal bone abnormality. A 7.7 cm superficial soft tissue defect is seen adjacent to the medial aspect of the distal right tibia. IMPRESSION: Superficial soft tissue defect adjacent to the medial aspect of the distal right tibia without an acute osseous abnormality. Electronically Signed   By: Virgina Norfolk M.D.   On: 01/09/2020 19:15   CT Head Wo Contrast  Result Date: 01/09/2020 CLINICAL DATA:  Mechanical fall from seated position EXAM: CT HEAD WITHOUT CONTRAST TECHNIQUE: Contiguous axial images were obtained from the base of the skull through the vertex without intravenous contrast. COMPARISON:  November 02, 2019 FINDINGS: Brain: No evidence of acute territorial infarction, hemorrhage, hydrocephalus,extra-axial collection or mass lesion/mass effect. There is dilatation the ventricles and sulci consistent with age-related atrophy. Low-attenuation changes in the deep white matter consistent with small vessel ischemia. Vascular: No hyperdense vessel or unexpected calcification. Skull: The skull is intact. No fracture or focal lesion identified. Sinuses/Orbits: The visualized paranasal sinuses and mastoid air cells are clear. The orbits and globes intact. Other: None IMPRESSION: No acute intracranial abnormality. Findings consistent with age related atrophy and chronic small vessel ischemia Electronically Signed   By: Prudencio Pair M.D.   On: 01/09/2020 19:07    Procedures .Marland KitchenLaceration Repair  Date/Time: 01/09/2020 10:14 PM Performed by:  Carlisle Cater, PA-C Authorized by: Carlisle Cater, PA-C   Consent:    Consent obtained:  Verbal  Consent given by:  Patient   Risks discussed:  Poor cosmetic result, pain and infection   Alternatives discussed:  No treatment Anesthesia (see MAR for exact dosages):    Anesthesia method:  Local infiltration   Local anesthetic:  Lidocaine 2% WITH epi Laceration details:    Location:  Leg   Leg location:  R lower leg   Length (cm):  4 Repair type:    Repair type:  Intermediate Pre-procedure details:    Preparation:  Imaging obtained to evaluate for foreign bodies and patient was prepped and draped in usual sterile fashion Exploration:    Hemostasis achieved with:  Epinephrine and direct pressure   Wound exploration: wound explored through full range of motion and entire depth of wound probed and visualized     Wound extent: no fascia violation noted, no foreign bodies/material noted and no muscle damage noted     Contaminated: no   Treatment:    Area cleansed with:  Saline   Amount of cleaning:  Extensive   Irrigation solution:  Sterile saline   Irrigation volume:  1000cc   Visualized foreign bodies/material removed: no   Skin repair:    Repair method:  Sutures   Suture size:  4-0   Suture material:  Nylon   Suture technique:  Simple interrupted   Number of sutures:  6 Approximation:    Approximation:  Loose Post-procedure details:    Dressing:  Open (no dressing)   Patient tolerance of procedure:  Tolerated well, no immediate complications Comments:     There was a flap of devitalized tissue that was debrided sharply with scissors extending the majority of the length of the wound.  Patient tolerated well.   (including critical care time)  Medications Ordered in ED Medications  lidocaine-EPINEPHrine (XYLOCAINE W/EPI) 1 %-1:100000 (with pres) injection 10 mL (10 mLs Infiltration Given by Other 01/09/20 1829)    ED Course  I have reviewed the triage vital signs and the  nursing notes.  Pertinent labs & imaging results that were available during my care of the patient were reviewed by me and considered in my medical decision making (see chart for details).  Patient seen and examined. Work-up initiated. Medications ordered.   Vital signs reviewed and are as follows: BP (!) 142/64    Pulse 65    Resp 19    SpO2 94%   7:03 PM Head CT reviewed. Discussed earlier with Dr. Kathrynn Humble.   10:14 PM Pt rechecked. Head CT okay. Labs okay. I repaired her wound with stitches. Pt updated.   10:55 PM reviewed work-up with Dr. Kathrynn Humble.  On recheck EKG, patient with QT close to 600.  Will admit for syncope work-up in setting of prolonged QT.   MDM Rules/Calculators/A&P                          Admit.    Final Clinical Impression(s) / ED Diagnoses Final diagnoses:  Syncope, unspecified syncope type  Injury of head, initial encounter  Laceration of right lower extremity, initial encounter  Prolonged QT interval    Rx / DC Orders ED Discharge Orders    None       Carlisle Cater, PA-C 01/09/20 [place], Ankit, MD 01/20/20 1527

## 2020-01-09 NOTE — H&P (Addendum)
[facility] Admission History and Physical Service Pager: [phone]  Patient name: Bianca [name] Medical record number: [id number] Date of birth: 28-Oct-1922 Age: 86 y.o. Gender: female  Primary Care Provider: [name], [name], NP Consultants: none Code Status: DNR, present at bedside and in chart Preferred Emergency Contact: Lowell Guitar, [phone]  Chief Complaint: fall from seated position   Assessment and Plan: Bianca [name] is a 86 y.o. female presenting after a fall from seated position with concern for syncope. PMH is significant for  dementia, SVT, transient bradycardia, A fib on Eliquis, hx CVA, emphysema, CKD.  Fall, possible syncope Presented with reported fall off of toilet at [facility] this evening.  Reportedly fell forward and fell onto the right side of her body.  Patient has dementia at baseline and was reported to be at her cognitive baseline on presentation.  She is conversational, but does not recall many events.  Unclear if she had a syncopal episode.  She had a contusion to the back of her head and a right ankle laceration on presentation.  She is also on Eliquis for secondary stroke prevention in the setting of A Fib. CT head was negative for acute bleed, right ankle XR was negative for acute fracture.  She has remained at neurologic baseline while in the ED.  VSS aside from mild bradycardia to 59.  Initial Trop 14.  She has a history of CVA, therefore could consider TIA as cause, although would not proceed with imaging at this time as she is currently being treated appropriately for recent stroke and management would not change.  Last Echo was 7/29 which showed EF 60-65% with no cardiac source of embolism, carotid US on 7/30 was also without significant stenosis.  She was seen by neurology on 9/20 as well and was advised to continue Lipitor 20mg  and Eliquis 2.5mg  BID.  Of note, EKG on presentation was with Prolonged QTc of 632 that  improved to 594 on repeat.  On review, she has not previously had prolonged EKG, therefore could be 2/2 K (3.3 on admission), hypomagnesemia (Mag level pending), or amiodarone, which was added in July after A fib diagnosed with CVA workup.  She also has a history of transient bradycardia, for which beta blockers were discontinued in July, and with HR of 59 on admission, it is possible that a bradycardic episode caused syncope.  It is also possible that the fall was 2/2 frailty and environment.  For now, will treat prolonged QTc per below, monitor on cardiac telemetry, and have PT/OT see in the AM.  Could consider more extensive work up in the AM if deemed to change management. - place in observation, med tele - cardiac monitoring - AM BMP, Mag, CBC - AM EKG - UA - hold amiodarone - tylenol prn pain - PT/OT - maintain K >4, Mag >2 - trend trops - consider repeat Echo, MRI if believe would change management - consider cardiology consult in AM if no improvement in Qtc to make determination about long term use of amiodarone - Eliquis for DVT PPx - Dysphagia 3 Diet per SLP consult in July  Right ankle laceration 2/2 fall per above.  S/P repair in ED.  XR of right ankle without acute fracture.  Continue standard care with cleansing and bandage.   - remove sutures in 10-14 days - tylenol prn pain  Prolonged QTc  Prolonged on presentation to 632, on repeat was 594.  K 3.3 in ED,  41mEq K-Dur x2, Magnesium level ordered and pending.  On chart review, no other QTc's previously prolonged.  She is on amiodarone which is a new medication from her most recent hospitalization and could be contributing to her prolonged QTc.  Plan to replete her electrolytes overnight, hold amiodarone, monitor on telemetry,  recheck QTc in AM, and consider cardiology consult if not yet improved, is if amiodarone is causing prolonged QTc, could have contributed to her possible syncopal event.  Spoke with pharmacy in regards to  holding amiodarone, given only on since July, but pharmacist stated that should be built up enough to have long half-life (usually over 60 days), therefore okay to hold for now. - replete K, goal >4 - f/u Mag, replete if <2 - monitor BMP, Mag - hold amiodarone - AM EKG - cardiac telemetry - consider cardiology consult in AM pending EKG to decide amiodarone  Hx CVA Diagnosed in 10/2019, found to have afib at that time.  Seen by neurology as outpatient on 9/20.  Per this note, had residual right-sided homonymous hemianopsia, but unable to accurately assess on admission.  Otherwise, is neurologically intact on exam.  She has been on Eliquis 2.5mg , Lipitor 20mg  for secondary prevention.  CT head on admission negative for acute bleed after fall. - cont Eliquis, Lipitor - plan per above  Atrial Fibrillation  Bradycardia Diagnosed during hospitalization in July when presented with CVA.  Has been on amiodarone and Eliquis since that time.  Per above, now with prolonged QTc, therefore amiodarone could be contributing as no prior history noted in chart of prolonged QTc on prior EKGs.  Will hold amiodarone for now. No longer on beta blockers given bradycardia, discontinued during July hospitalization. - cont eliquis - holding amiodarone - plan per above  Dementia Lives at [place].  Uses a walker for mobility.  Conversational at baseline, but short term memory is decreased.  Was reported in ED to be at neurological baseline.  She was oriented to year only, not name, location, or month.  She was able to state that she fell, but unable to provide details of the event.  Home meds: melatonin, zoloft 12.5mg  QHS. - cont home meds  CKD Baseline appears around 0.8-1.0.  Cr on admission 0.81. - monitor BMP  Emphysema Stable on RA.  No home meds.   - no intervention needed at present  GERD Home meds: prilosec 20mg  QD - cont home meds - consider discontinuing PPI in patient given age  FEN/GI:  Dysphagia 3 diet per prior SLP eval from July Prophylaxis: Eliquis  Disposition: place in observation, cardiac telemetry  History of Present Illness:  Bianca [name] is a 86 y.o. female presenting after fall in which she sustained a right ankle laceration and struck the back of her head resulting in a hematoma.   Patient states that she fell this evening, but is unclear on the events that occurred.  States that she thinks she might have been going to the bathroom.    Reports that she has been having right hip pain for weeks now and states that she was seen by her doctor about this.  She reports that she uses a walker at baseline.  Per ED provider report, patient had possible syncopal episode and struck head while on the toilet this evening after leaning forward and falling on her right side.  She presented with a laceration on her right ankle from the fall that was repaired in ED as well.  She  is conversant at baseline, but unable to provide information about the incident.  The fall occurred at [facility], where she currently resides.      Review Of Systems: Per HPI with the following additions:   Review of Systems  Unable to perform ROS: Dementia     Patient Active Problem List   Diagnosis Date Noted  . Adjustment disorder with mixed anxiety and depressed mood 11/07/2019  . Stroke ([place]) 11/05/2019  . Tachycardia 11/03/2019  . Hypotension 11/03/2019  . Sinus bradycardia 11/02/2019  . Acute lower UTI 11/02/2019  . Memory deficit 10/26/2019  . CKD (chronic kidney disease) stage 3, GFR 30-59 ml/min (HCC) 09/30/2019  . Edema 06/11/2019  . Metatarsalgia 06/11/2019  . GERD (gastroesophageal reflux disease) 05/14/2019  . Paroxysmal SVT (supraventricular tachycardia) ([place]) 04/27/2019  . Pneumonia due to COVID-19 virus 04/27/2019  . Chronic diarrhea 04/27/2019  . Urinary frequency 04/27/2019  . Protein-calorie malnutrition ([place]) 04/27/2019  . Emphysema of lung ([place]) 04/27/2019     Past Medical History: Past Medical History:  Diagnosis Date  . COVID-19   . 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 No pertinent surgical history.  Social History: Social History   Tobacco Use  . Smoking status: Never Smoker  . Smokeless tobacco: Never Used  Vaping Use  . Vaping Use: Never used  Substance Use Topics  . Alcohol use: Never  . Drug use: Never   Additional social history: Lives at [facility] Please also refer to relevant sections of EMR.  Family History: No family history on file. States that mother lost all of her hair, but unable to provide any other family history  Allergies and Medications: Allergies  Allergen Reactions  . Azopt [Brinzolamide] Other (See Comments)    UNK  . Brimonidine Other (See Comments)    UNK  . Lumigan [Bimatoprost] Other (See Comments)    UNK   No current facility-administered medications on file prior to encounter.   Current Outpatient Medications on File Prior to Encounter  Medication Sig Dispense Refill  . acetaminophen (TYLENOL) 325 MG tablet Take 650 mg by mouth every 6 (six) hours as needed for mild pain or headache.     Marland Kitchen amiodarone (PACERONE) 100 MG tablet Take 1 tablet (100 mg total) by mouth daily. 30 tablet 1  . apixaban (ELIQUIS) 2.5 MG TABS tablet Take 1 tablet (2.5 mg total) by mouth 2 (two) times daily. 60 tablet 1  . atorvastatin (LIPITOR) 20 MG tablet Take 1 tablet (20 mg total) by mouth daily at 6 PM. 30 tablet 1  . b complex vitamins tablet Take 1 tablet by mouth daily.     Marland Kitchen bismuth subsalicylate (PEPTO BISMOL) 262 MG/15ML suspension Take 60 mLs by mouth daily as needed for indigestion or diarrhea or loose stools.     . Calcium Carbonate-Vitamin D 600-400 MG-UNIT tablet Take 1 tablet by mouth daily.    . diphenoxylate-atropine (LOMOTIL) 2.5-0.025 MG tablet Take by mouth daily as needed for diarrhea  or loose stools.    . Foot Care Products MISC by Does not apply route. Gel bunion cushion once a day on Wednesday    . lactose free nutrition (BOOST) LIQD Take 237 mLs by mouth daily.    . melatonin 3 MG TABS tablet Take 3 mg by mouth at bedtime.     . Multiple Vitamins-Minerals (CENTRUM SILVER PO) Take 1 tablet by mouth daily. 7am-10am.    .  NON FORMULARY Moisturizing Cream to extremities with morning and evening care Every Shift    . NON FORMULARY Regular Special Instructions: Regular Diet, Thin Liquids    . omeprazole (PRILOSEC) 20 MG capsule Take 20 mg by mouth daily.     . sertraline (ZOLOFT) 25 MG tablet Take 0.5 tablets (12.5 mg total) by mouth at bedtime. 30 tablet 0  . timolol (BETIMOL) 0.5 % ophthalmic solution Place 1 drop into both eyes daily. 7am-10am.      Objective: BP (!) 147/69   Pulse (!) 59   Temp 98.3 F (36.8 C) (Oral)   Resp 20   Ht 5' (1.524 m)   Wt 43.1 kg   SpO2 93%   BMI 18.55 kg/m    Exam: General: Elderly female, frail appearing lying in bed in no acute distress Eyes: No scleral icterus, extraocular muscles intact bilaterally Cardiovascular: Normal S1 and S2, bilateral radial pulses palpable Respiratory: Clear to auscultation without wheezing no increased work of breathing, stable on room air Gastrointestinal: No tenderness to palpation, bowel sounds present MSK: Laceration repair on the right lower extremity superior to ankle wound dressed, displays normal range of motion of upper and lower extremities bilaterally Derm: Hematoma in occipital region, no signs of active bleeding Neuro: Alert, intermittently oriented to self, unable to answer appropriately for current location or situation when asked directly, patient able to report her primary resident and give her emergency contact, year is appropriate   Labs and Imaging: CBC BMET  Recent Labs  Lab 01/09/20 1820  WBC 6.9  HGB 11.0*  HCT 34.4*  PLT 305   Recent Labs  Lab 01/09/20 1820  NA  138  K 3.3*  CL 107  CO2 22  BUN 20  CREATININE 0.81  GLUCOSE 101*  CALCIUM 8.1*     EKG: Prolonged QTC initially greater than 600, QTc 594 on repeat  DG Ankle Complete Right  Result Date: 01/09/2020 CLINICAL DATA:  Right leg laceration. EXAM: RIGHT ANKLE - COMPLETE 3+ VIEW COMPARISON:  None. FINDINGS: There is no evidence of fracture, dislocation, or joint effusion. There is no evidence of arthropathy or other focal bone abnormality. A 7.7 cm superficial soft tissue defect is seen adjacent to the medial aspect of the distal right tibia. IMPRESSION: Superficial soft tissue defect adjacent to the medial aspect of the distal right tibia without an acute osseous abnormality. Electronically Signed   By: Virgina Norfolk M.D.   On: 01/09/2020 19:15   CT Head Wo Contrast  Result Date: 01/09/2020 CLINICAL DATA:  Mechanical fall from seated position EXAM: CT HEAD WITHOUT CONTRAST TECHNIQUE: Contiguous axial images were obtained from the base of the skull through the vertex without intravenous contrast. COMPARISON:  November 02, 2019 FINDINGS: Brain: No evidence of acute territorial infarction, hemorrhage, hydrocephalus,extra-axial collection or mass lesion/mass effect. There is dilatation the ventricles and sulci consistent with age-related atrophy. Low-attenuation changes in the deep white matter consistent with small vessel ischemia. Vascular: No hyperdense vessel or unexpected calcification. Skull: The skull is intact. No fracture or focal lesion identified. Sinuses/Orbits: The visualized paranasal sinuses and mastoid air cells are clear. The orbits and globes intact. Other: None IMPRESSION: No acute intracranial abnormality. Findings consistent with age related atrophy and chronic small vessel ischemia Electronically Signed   By: Prudencio Pair M.D.   On: 01/09/2020 19:07   [place] Constable, DO 01/09/20 PGY-3, [facility]  Eulis Foster, MD 01/09/2020, 10:50 PM PGY-2, [facility] pager: [phone],  text pages welcome

## 2020-01-10 DIAGNOSIS — I484 Atypical atrial flutter: Secondary | ICD-10-CM | POA: Diagnosis not present

## 2020-01-10 DIAGNOSIS — R9431 Abnormal electrocardiogram [ECG] [EKG]: Secondary | ICD-10-CM

## 2020-01-10 DIAGNOSIS — I4892 Unspecified atrial flutter: Secondary | ICD-10-CM | POA: Insufficient documentation

## 2020-01-10 DIAGNOSIS — W1811XA Fall from or off toilet without subsequent striking against object, initial encounter: Secondary | ICD-10-CM | POA: Diagnosis present

## 2020-01-10 DIAGNOSIS — I69398 Other sequelae of cerebral infarction: Secondary | ICD-10-CM | POA: Diagnosis not present

## 2020-01-10 DIAGNOSIS — J439 Emphysema, unspecified: Secondary | ICD-10-CM | POA: Diagnosis present

## 2020-01-10 DIAGNOSIS — N182 Chronic kidney disease, stage 2 (mild): Secondary | ICD-10-CM | POA: Diagnosis not present

## 2020-01-10 DIAGNOSIS — I5189 Other ill-defined heart diseases: Secondary | ICD-10-CM | POA: Insufficient documentation

## 2020-01-10 DIAGNOSIS — K219 Gastro-esophageal reflux disease without esophagitis: Secondary | ICD-10-CM | POA: Diagnosis not present

## 2020-01-10 DIAGNOSIS — H40009 Preglaucoma, unspecified, unspecified eye: Secondary | ICD-10-CM | POA: Diagnosis not present

## 2020-01-10 DIAGNOSIS — Z8701 Personal history of pneumonia (recurrent): Secondary | ICD-10-CM | POA: Diagnosis not present

## 2020-01-10 DIAGNOSIS — Z7901 Long term (current) use of anticoagulants: Secondary | ICD-10-CM | POA: Insufficient documentation

## 2020-01-10 DIAGNOSIS — H353220 Exudative age-related macular degeneration, left eye, stage unspecified: Secondary | ICD-10-CM | POA: Diagnosis not present

## 2020-01-10 DIAGNOSIS — I480 Paroxysmal atrial fibrillation: Secondary | ICD-10-CM | POA: Diagnosis present

## 2020-01-10 DIAGNOSIS — E782 Mixed hyperlipidemia: Secondary | ICD-10-CM | POA: Insufficient documentation

## 2020-01-10 DIAGNOSIS — W19XXXA Unspecified fall, initial encounter: Secondary | ICD-10-CM | POA: Diagnosis not present

## 2020-01-10 DIAGNOSIS — M2550 Pain in unspecified joint: Secondary | ICD-10-CM | POA: Diagnosis not present

## 2020-01-10 DIAGNOSIS — Z20822 Contact with and (suspected) exposure to covid-19: Secondary | ICD-10-CM | POA: Diagnosis not present

## 2020-01-10 DIAGNOSIS — I4581 Long QT syndrome: Secondary | ICD-10-CM | POA: Diagnosis not present

## 2020-01-10 DIAGNOSIS — R29818 Other symptoms and signs involving the nervous system: Secondary | ICD-10-CM | POA: Diagnosis not present

## 2020-01-10 DIAGNOSIS — E785 Hyperlipidemia, unspecified: Secondary | ICD-10-CM | POA: Diagnosis present

## 2020-01-10 DIAGNOSIS — R350 Frequency of micturition: Secondary | ICD-10-CM | POA: Diagnosis not present

## 2020-01-10 DIAGNOSIS — R2681 Unsteadiness on feet: Secondary | ICD-10-CM | POA: Diagnosis not present

## 2020-01-10 DIAGNOSIS — I471 Supraventricular tachycardia: Secondary | ICD-10-CM | POA: Diagnosis not present

## 2020-01-10 DIAGNOSIS — I69391 Dysphagia following cerebral infarction: Secondary | ICD-10-CM | POA: Diagnosis not present

## 2020-01-10 DIAGNOSIS — Z681 Body mass index (BMI) 19 or less, adult: Secondary | ICD-10-CM | POA: Diagnosis not present

## 2020-01-10 DIAGNOSIS — S91011A Laceration without foreign body, right ankle, initial encounter: Secondary | ICD-10-CM | POA: Diagnosis not present

## 2020-01-10 DIAGNOSIS — H53461 Homonymous bilateral field defects, right side: Secondary | ICD-10-CM | POA: Diagnosis present

## 2020-01-10 DIAGNOSIS — F0390 Unspecified dementia without behavioral disturbance: Secondary | ICD-10-CM | POA: Diagnosis not present

## 2020-01-10 DIAGNOSIS — E876 Hypokalemia: Secondary | ICD-10-CM | POA: Diagnosis not present

## 2020-01-10 DIAGNOSIS — L89210 Pressure ulcer of right hip, unstageable: Secondary | ICD-10-CM | POA: Diagnosis not present

## 2020-01-10 DIAGNOSIS — R1311 Dysphagia, oral phase: Secondary | ICD-10-CM | POA: Diagnosis not present

## 2020-01-10 DIAGNOSIS — W19XXXD Unspecified fall, subsequent encounter: Secondary | ICD-10-CM | POA: Diagnosis not present

## 2020-01-10 DIAGNOSIS — R0902 Hypoxemia: Secondary | ICD-10-CM | POA: Diagnosis not present

## 2020-01-10 DIAGNOSIS — S0003XA Contusion of scalp, initial encounter: Secondary | ICD-10-CM | POA: Diagnosis present

## 2020-01-10 DIAGNOSIS — Z8616 Personal history of COVID-19: Secondary | ICD-10-CM | POA: Diagnosis not present

## 2020-01-10 DIAGNOSIS — Z7401 Bed confinement status: Secondary | ICD-10-CM | POA: Diagnosis not present

## 2020-01-10 DIAGNOSIS — N951 Menopausal and female climacteric states: Secondary | ICD-10-CM | POA: Diagnosis not present

## 2020-01-10 DIAGNOSIS — U071 COVID-19: Secondary | ICD-10-CM | POA: Diagnosis not present

## 2020-01-10 DIAGNOSIS — Z8673 Personal history of transient ischemic attack (TIA), and cerebral infarction without residual deficits: Secondary | ICD-10-CM | POA: Insufficient documentation

## 2020-01-10 DIAGNOSIS — S81811D Laceration without foreign body, right lower leg, subsequent encounter: Secondary | ICD-10-CM | POA: Diagnosis not present

## 2020-01-10 DIAGNOSIS — R1310 Dysphagia, unspecified: Secondary | ICD-10-CM | POA: Diagnosis present

## 2020-01-10 DIAGNOSIS — R197 Diarrhea, unspecified: Secondary | ICD-10-CM | POA: Diagnosis not present

## 2020-01-10 DIAGNOSIS — R410 Disorientation, unspecified: Secondary | ICD-10-CM | POA: Diagnosis not present

## 2020-01-10 DIAGNOSIS — B029 Zoster without complications: Secondary | ICD-10-CM | POA: Diagnosis not present

## 2020-01-10 DIAGNOSIS — Z66 Do not resuscitate: Secondary | ICD-10-CM | POA: Diagnosis not present

## 2020-01-10 DIAGNOSIS — I639 Cerebral infarction, unspecified: Secondary | ICD-10-CM | POA: Diagnosis not present

## 2020-01-10 DIAGNOSIS — R55 Syncope and collapse: Secondary | ICD-10-CM | POA: Diagnosis not present

## 2020-01-10 DIAGNOSIS — N183 Chronic kidney disease, stage 3 unspecified: Secondary | ICD-10-CM | POA: Diagnosis not present

## 2020-01-10 DIAGNOSIS — M6281 Muscle weakness (generalized): Secondary | ICD-10-CM | POA: Diagnosis not present

## 2020-01-10 DIAGNOSIS — E440 Moderate protein-calorie malnutrition: Secondary | ICD-10-CM | POA: Diagnosis not present

## 2020-01-10 DIAGNOSIS — R32 Unspecified urinary incontinence: Secondary | ICD-10-CM | POA: Diagnosis not present

## 2020-01-10 DIAGNOSIS — Y92091 Bathroom in other non-institutional residence as the place of occurrence of the external cause: Secondary | ICD-10-CM | POA: Diagnosis not present

## 2020-01-10 DIAGNOSIS — H353231 Exudative age-related macular degeneration, bilateral, with active choroidal neovascularization: Secondary | ICD-10-CM | POA: Diagnosis not present

## 2020-01-10 LAB — BASIC METABOLIC PANEL
Anion gap: 8 (ref 5–15)
BUN: 14 mg/dL (ref 8–23)
CO2: 24 mmol/L (ref 22–32)
Calcium: 8.2 mg/dL — ABNORMAL LOW (ref 8.9–10.3)
Chloride: 110 mmol/L (ref 98–111)
Creatinine, Ser: 0.69 mg/dL (ref 0.44–1.00)
GFR calc Af Amer: >60 mL/min (ref >60)
GFR calc non Af Amer: >60 mL/min (ref >60)
Glucose, Bld: 86 mg/dL (ref 70–99)
Potassium: 3.6 mmol/L (ref 3.5–5.1)
Sodium: 142 mmol/L (ref 135–145)

## 2020-01-10 LAB — URINALYSIS, ROUTINE W REFLEX MICROSCOPIC
Bilirubin Urine: NEGATIVE
Glucose, UA: NEGATIVE mg/dL
Hgb urine dipstick: NEGATIVE
Ketones, ur: NEGATIVE mg/dL
Nitrite: NEGATIVE
Protein, ur: NEGATIVE mg/dL
Specific Gravity, Urine: 1.016 (ref 1.005–1.030)
pH: 5 (ref 5.0–8.0)

## 2020-01-10 LAB — CBC
HCT: 34.8 % — ABNORMAL LOW (ref 36.0–46.0)
Hemoglobin: 10.9 g/dL — ABNORMAL LOW (ref 12.0–15.0)
MCH: 31.2 pg (ref 26.0–34.0)
MCHC: 31.3 g/dL (ref 30.0–36.0)
MCV: 99.7 fL (ref 80.0–100.0)
Platelets: 287 10*3/uL (ref 150–400)
RBC: 3.49 MIL/uL — ABNORMAL LOW (ref 3.87–5.11)
RDW: 18.3 % — ABNORMAL HIGH (ref 11.5–15.5)
WBC: 7.6 10*3/uL (ref 4.0–10.5)
nRBC: 0 % (ref 0.0–0.2)

## 2020-01-10 LAB — TROPONIN I (HIGH SENSITIVITY)
Troponin I (High Sensitivity): 14 ng/L (ref <18)
Troponin I (High Sensitivity): 14 ng/L (ref <18)

## 2020-01-10 LAB — MAGNESIUM
Magnesium: 1.9 mg/dL (ref 1.7–2.4)
Magnesium: 2 mg/dL (ref 1.7–2.4)

## 2020-01-10 LAB — RESPIRATORY PANEL BY RT PCR (FLU A&B, COVID)
Influenza A by PCR: NEGATIVE
Influenza B by PCR: NEGATIVE
SARS Coronavirus 2 by RT PCR: NEGATIVE

## 2020-01-10 MED ORDER — POTASSIUM CHLORIDE 10 MEQ/100ML IV SOLN
10.0000 meq | INTRAVENOUS | Status: AC
  Filled 2020-01-10: qty 100

## 2020-01-10 MED ORDER — MAGNESIUM SULFATE 2 GM/50ML IV SOLN
2.0000 g | Freq: Once | INTRAVENOUS | Status: AC
  Administered 2020-01-10: 2 g via INTRAVENOUS
  Filled 2020-01-10: qty 50

## 2020-01-10 NOTE — Evaluation (Signed)
Physical Therapy Evaluation Patient Details Name: Bianca [name] MRN: [id number] DOB: Jul 23, 1922 Today's Date: 01/10/2020   History of Present Illness  Pt is a 86 y/o female admitted after fall. Pt sustained laceration to R ankle. PMH includes dementia, a fib, CVA and CKD.   Clinical Impression  Pt admitted secondary to problem above with deficits below. Pt with cognitive deficits and baseline and demonstrated decreased safety awareness. Was able to stand at edge of stretcher with min A. Pt fearful of falling, so requesting to sit back down. Was able to check orthostatics and orthostatics negative. Pt from ALF at baseline and reports she ambulates with RW. Unsure if she required assist prior.  Feel pt will be able to return to ALF with HHPT if ALF able to provide necessary assist. If not, may need to consider SNF level therapies. Will continue to follow acutely to maximize functional mobility independence and safety.     Follow Up Recommendations Home health PT;Supervision/Assistance - 24 hour (REturn to ALF )    Equipment Recommendations  None recommended by PT    Recommendations for Other Services       Precautions / Restrictions Precautions Precautions: Fall Restrictions Weight Bearing Restrictions: No      Mobility  Bed Mobility Overal bed mobility: Needs Assistance Bed Mobility: Supine to Sit;Sit to Supine     Supine to sit: Supervision Sit to supine: Supervision   General bed mobility comments: Upon entry, pt sitting at edge of stretcher with legs in between side rails. Required assist to slide legs back through side rails and on to bed. Pt able to perform bed mobility with supervision for safety when bed rails down.   Transfers Overall transfer level: Needs assistance Equipment used: None Transfers: Sit to/from Stand Sit to Stand: Min assist         General transfer comment: Min A for steadying assist to stand at edge of stretcher this session. Orthostatics  negative. Pt somewhat fearful of falling. Held on to PT arms for support. Pt wanting to eat lunch, so further mobility deferred.   Ambulation/Gait                Stairs            Wheelchair Mobility    Modified Rankin (Stroke Patients Only)       Balance Overall balance assessment: Needs assistance Sitting-balance support: Feet supported;No upper extremity supported Sitting balance-Leahy Scale: Good     Standing balance support: Bilateral upper extremity supported;During functional activity Standing balance-Leahy Scale: Poor Standing balance comment: Reliant on UE and external support                              Pertinent Vitals/Pain Pain Assessment: No/denies pain    Home Living Family/patient expects to be discharged to:: Assisted living               Home Equipment: Walker - 2 wheels      Prior Function Level of Independence: Needs assistance   Gait / Transfers Assistance Needed: Uses RW for ambulation  ADL's / Homemaking Assistance Needed: Reports she does not take many baths. Reports staff assists her when she does.         Hand Dominance        Extremity/Trunk Assessment   Upper Extremity Assessment Upper Extremity Assessment: Defer to OT evaluation    Lower Extremity Assessment Lower Extremity Assessment: Generalized weakness;RLE deficits/detail RLE  Deficits / Details: R leg laceration from fall.     Cervical / Trunk Assessment Cervical / Trunk Assessment: Normal  Communication   Communication: No difficulties  Cognition Arousal/Alertness: Awake/alert Behavior During Therapy: WFL for tasks assessed/performed Overall Cognitive Status: History of cognitive impairments - at baseline                                 General Comments: Dementia at baseline. Pt with decreased safety awareness and easily distracted.       General Comments      Exercises     Assessment/Plan    PT Assessment Patient  needs continued PT services  PT Problem List Decreased strength;Decreased balance;Decreased mobility;Decreased cognition;Decreased knowledge of use of DME;Decreased safety awareness;Decreased knowledge of precautions       PT Treatment Interventions Gait training;DME instruction;Functional mobility training;Therapeutic activities;Therapeutic exercise;Balance training;Patient/family education    PT Goals (Current goals can be found in the Care Plan section)  Acute Rehab PT Goals Patient Stated Goal: to go back to ALF  PT Goal Formulation: With patient Time For Goal Achievement: 01/24/20 Potential to Achieve Goals: Good    Frequency Min 3X/week   Barriers to discharge        Co-evaluation               AM-PAC PT "6 Clicks" Mobility  Outcome Measure Help needed turning from your back to your side while in a flat bed without using bedrails?: None Help needed moving from lying on your back to sitting on the side of a flat bed without using bedrails?: None Help needed moving to and from a bed to a chair (including a wheelchair)?: A Little Help needed standing up from a chair using your arms (e.g., wheelchair or bedside chair)?: A Little Help needed to walk in hospital room?: A Lot Help needed climbing 3-5 steps with a railing? : Total 6 Click Score: 17    End of Session Equipment Utilized During Treatment: Gait belt Activity Tolerance: Patient tolerated treatment well Patient left: in bed;with call bell/phone within reach (on stretcher in ED ) Nurse Communication: Mobility status PT Visit Diagnosis: Unsteadiness on feet (R26.81);Muscle weakness (generalized) (M62.81)    Time: 1252-1310 PT Time Calculation (min) (ACUTE ONLY): 18 min   Charges:   PT Evaluation $PT Eval Moderate Complexity: 1 Mod          Bianca [name], PT, DPT  Acute Rehabilitation Services  Pager: [phone] Office: [phone]   Rudean Hitt 01/10/2020, 1:24 PM

## 2020-01-10 NOTE — ED Notes (Signed)
MS Breakfast Ordered 

## 2020-01-10 NOTE — ED Notes (Signed)
Lunch Tray Ordered @ 1715. 

## 2020-01-10 NOTE — Evaluation (Signed)
Occupational Therapy Evaluation Patient Details Name: Bianca [name] MRN: [id number] DOB: 1923/02/16 Today's Date: 01/10/2020    History of Present Illness Pt is a 86 y/o female admitted after fall. Pt sustained laceration to R ankle. PMH includes dementia, a fib, CVA and CKD.    Clinical Impression   Pt admitted with above diagnoses, presenting with generalized weakness and baseline cognitive deficits from dementia. At baseline, pt lives in ALF memory care and receives as needed assist for ADLs and uses RW. At time of eval, pt completed bed mobility with supervision and sit <> stands with min A. Pt standing from tall stretcher, causing her feet to slide forward in translation. Pt then became fearful of falling and unsafely grabbing at external objects in attempts to stabilize herself. Pt was assisted in sitting back on stretcher to maintain safety. While seated EOB pt is able to complete figure four method for LBD. Given current status, recommend pt return to ALF with HHOT to improve ADL independence and safety. Will continue to follow per POC listed below.    Follow Up Recommendations  Home health OT;Supervision/Assistance - 24 hour (return to ALF)    Equipment Recommendations  None recommended by OT    Recommendations for Other Services       Precautions / Restrictions Precautions Precautions: Fall Restrictions Weight Bearing Restrictions: No      Mobility Bed Mobility Overal bed mobility: Needs Assistance Bed Mobility: Supine to Sit;Sit to Supine     Supine to sit: Supervision Sit to supine: Supervision   General bed mobility comments: able to come to EOB without assist, close supervision to manage safety due to impulsivity  Transfers Overall transfer level: Needs assistance Equipment used: 1 person hand held assist Transfers: Sit to/from Stand Sit to Stand: Min assist         General transfer comment: assist to rise and steady from stretcher forward facing OT-  hanging onto OTs arms for support. Feet were sliding forward due to tall height of stretcher. Pt fearful of falling and needed to sit back down to maintain safety    Balance Overall balance assessment: Needs assistance Sitting-balance support: Feet supported;No upper extremity supported Sitting balance-Leahy Scale: Good     Standing balance support: Bilateral upper extremity supported;During functional activity Standing balance-Leahy Scale: Poor Standing balance comment: Reliant on UE and external support                            ADL either performed or assessed with clinical judgement   ADL Overall ADL's : Needs assistance/impaired Eating/Feeding: Set up;Sitting   Grooming: Set up;Sitting   Upper Body Bathing: Minimal assistance;Sitting   Lower Body Bathing: Minimal assistance;Sit to/from stand;Sitting/lateral leans   Upper Body Dressing : Minimal assistance;Sitting   Lower Body Dressing: Minimal assistance;Sit to/from stand;Sitting/lateral leans   Toilet Transfer: Minimal assistance;Ambulation;RW;Cueing for safety;Cueing for sequencing   Toileting- Clothing Manipulation and Hygiene: Set up;Sitting/lateral lean;Sit to/from stand       Functional mobility during ADLs: Minimal assistance;Rolling walker;Cueing for safety;Cueing for sequencing General ADL Comments: increased assist due to cognitive deficits with baseline dementia     Vision Baseline Vision/History: Wears glasses Wears Glasses: At all times Patient Visual Report: No change from baseline       Perception     Praxis      Pertinent Vitals/Pain Pain Assessment: No/denies pain     Hand Dominance     Extremity/Trunk Assessment Upper Extremity Assessment  Upper Extremity Assessment: Generalized weakness   Lower Extremity Assessment Lower Extremity Assessment: Defer to PT evaluation       Communication Communication Communication: No difficulties   Cognition Arousal/Alertness:  Awake/alert Behavior During Therapy: Impulsive;Restless Overall Cognitive Status: History of cognitive impairments - at baseline                                 General Comments: History of dementia at baseline. Difficulty orienting self and telling stories not related to current situation. Quite impulsive with mobility with poor attention- requires max mutlimodal cues to maintain safety   General Comments       Exercises     Shoulder Instructions      Home Living Family/patient expects to be discharged to:: Assisted living                             Home Equipment: Walker - 2 wheels   Additional Comments: Pt is unreliable historian      Prior Functioning/Environment Level of Independence: Needs assistance  Gait / Transfers Assistance Needed: Uses RW for ambulation ADL's / Homemaking Assistance Needed: Reports she does not take many baths. Reports staff assists her when she does.             OT Problem List: Decreased strength;Decreased knowledge of use of DME or AE;Decreased knowledge of precautions;Decreased cognition;Decreased activity tolerance;Impaired balance (sitting and/or standing);Decreased safety awareness      OT Treatment/Interventions: Self-care/ADL training;Therapeutic exercise;Patient/family education;Balance training;Therapeutic activities;Energy conservation;DME and/or AE instruction;Cognitive remediation/compensation    OT Goals(Current goals can be found in the care plan section) Acute Rehab OT Goals Patient Stated Goal: to go back to ALF  OT Goal Formulation: With patient Time For Goal Achievement: 01/24/20 Potential to Achieve Goals: Good  OT Frequency: Min 2X/week   Barriers to D/C:            Co-evaluation              AM-PAC OT "6 Clicks" Daily Activity     Outcome Measure Help from another person eating meals?: A Little Help from another person taking care of personal grooming?: A Little Help from  another person toileting, which includes using toliet, bedpan, or urinal?: A Little Help from another person bathing (including washing, rinsing, drying)?: A Lot Help from another person to put on and taking off regular upper body clothing?: A Little Help from another person to put on and taking off regular lower body clothing?: A Lot 6 Click Score: 16   End of Session Equipment Utilized During Treatment: Gait belt Nurse Communication: Mobility status  Activity Tolerance: Patient tolerated treatment well Patient left: in bed;with call bell/phone within reach  OT Visit Diagnosis: Unsteadiness on feet (R26.81);Other abnormalities of gait and mobility (R26.89);Muscle weakness (generalized) (M62.81);Other symptoms and signs involving cognitive function                Time: 6301-6010 OT Time Calculation (min): 12 min Charges:  OT General Charges $OT Visit: 1 Visit OT Evaluation $OT Eval Moderate Complexity: [place], MSOT, OTR/L Acute Rehabilitation Services [facility] Office Number: [phone] Pager: [phone]  Zenovia Jarred 01/10/2020, 5:16 PM

## 2020-01-10 NOTE — ED Notes (Signed)
Patient trying to sit up and get out of bed. Patient reoriented and assisted with repositioning by NA. Patient remains confused. Door open to view patient from nurse station. Call bell in reach. SR up x 2 with seizure pads in place to prevent patient from placing legs through side rails.

## 2020-01-10 NOTE — Progress Notes (Signed)
PT Cancellation Note  Patient Details Name: Bianca [name] MRN: [id number] DOB: 10/24/1922   Cancelled Treatment:    Reason Eval/Treat Not Completed: Active bedrest order Will follow up once activity orders updated and as schedule allows.   Lou Miner, DPT  Acute Rehabilitation Services  Pager: [phone] Office: [phone]    Rudean Hitt 01/10/2020, 9:58 AM

## 2020-01-10 NOTE — Consult Note (Signed)
CARDIOLOGY CONSULT NOTE  Patient ID: Bianca [name] MRN: [id number] DOB/AGE: 10/21/1922 86 y.o.  Admit date: 01/09/2020 Attending physician: Leeanne Rio, MD Primary Physician:  [name], [name], NP Outpatient Cardiologist: NA Inpatient Cardiologist: Rex Kras, DO, [facility]  Chief complaint: Status post fall Reason of consultation: Prolonged QT interval  HPI:  Bianca [name] is a 86 y.o. Caucasian female who presents with a chief complaint of " status post fall." Her past medical history and cardiovascular risk factors include: Paroxysmal SVT, paroxysmal atrial flutter, hyperlipidemia, grade 2 diastolic dysfunction, cognitive impairment suggestive of possible age-related dementia, history of stroke, postmenopausal female, advanced age.  History of present illness is limited secondary to her underlying dementia.  No family present at bedside.  Based on electronic medical records patient presented from friends home at [facility] facility by EMS after mechanical fall.  Not sure if there was a component of syncope associated with this event.  Patient denies loss of consciousness, blacking out, or passing out.  Patient states that she was in a line to get her meal and subsequently had a fall.  However based on electronic medical records patient was sitting on a toilet and when she leaned forward she fell.  It was reported that she struck the left side of her body against the wall and then hit the back of the head.  Patient is on Eliquis for thromboembolic prophylaxis and sustained a hematoma on her scalp and injury to her right distal leg.  CT was performed which noted no acute intracranial abnormality.  Details noted below for further reference.  Patient denies loss of bowel or bladder function.  Patient denies any chest pain or shortness of breath at rest or with effort related activity.  Cardiology was consulted during this hospitalization for prolonged QT and surface ECG.  When patient  presented to the hospital the underlying rhythm was sinus bradycardia with a heart rate of 59 bpm.  Calculated QTC on ECG was 632 ms.  Unable to manually calculate/verify the QTC as patient has wandering baseline suggestive of respiratory variation.  Her serum potassium was consistent with hypokalemia.  Magnesium levels within normal limits.  Electrolytes were replaced and her morning potassium level was 3.6.   List of home medication reviewed.  Patient is currently on amiodarone 100 mg p.o. daily, Zoloft 12.5 mg p.o. q. bedtime, and timolol eyedrops.  Currently patient is resting in bed comfortably without any acute distress and hemodynamically stable.  She was last evaluated by my colleague in July 2021 at that time she was diagnosed with atypical atrial flutter with 2-1 conduction and given her CHA2DS2-VASc SCORE Eliquis 2.5 mg p.o. twice daily was recommended.  She is also on amiodarone and beta-blocker therapy.  Not sure if she is currently on Lopressor at this time.  She had an echocardiogram recently which noted an LVEF of 54-65%, grade 2 diastolic impairment, mild pulmonary hypertension, moderate to severe TR, and mild AR.  At that time carotid duplex were also performed in July 2021 which did not show any significantly hemodynamically stenosis bilaterally.  Based on the available records via epic no recent ischemic evaluation.  ALLERGIES: Allergies  Allergen Reactions  . Azopt [Brinzolamide] Other (See Comments)    UNK  . Brimonidine Other (See Comments)    UNK  . Lumigan [Bimatoprost] Other (See Comments)    UNK    PAST MEDICAL HISTORY: Past Medical History:  Diagnosis Date  . COVID-19   . 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  Of note, based on EMR  unable to accurately verify with the patient due to underlying dementia.  PAST SURGICAL HISTORY: History reviewed. No pertinent surgical history. Unable to obtain due to underlying dementia.  FAMILY HISTORY: The patient family history is not on file. Unable to obtain due to underlying dementia.   SOCIAL HISTORY:  The patient  reports that she has never smoked. She has never used smokeless tobacco. She reports that she does not drink alcohol and does not use drugs. Unable to obtain due to underlying dementia.  MEDICATIONS: Current Outpatient Medications  Medication Instructions  . acetaminophen (TYLENOL) 650 mg, Oral, Every 6 hours PRN  . Amino Acids-Protein Hydrolys (FEEDING SUPPLEMENT, PRO-STAT 64,) LIQD 30 mLs, Oral, Daily  . amiodarone (PACERONE) 100 mg, Oral, Daily  . apixaban (ELIQUIS) 2.5 mg, Oral, 2 times daily  . atorvastatin (LIPITOR) 20 mg, Oral, Daily-1800  . b complex vitamins tablet 1 tablet, Oral, Daily  . bismuth subsalicylate (PEPTO BISMOL) 262 MG/15ML suspension 60 mLs, Oral, Daily PRN  . Calcium Carbonate-Vitamin D 600-400 MG-UNIT tablet 1 tablet, Oral, Daily  . diphenoxylate-atropine (LOMOTIL) 2.5-0.025 MG tablet 1 tablet, Oral, Daily PRN  . Foot Care Products MISC Does not apply, Gel bunion cushion once a day on Wednesday  . lactose free nutrition (BOOST) LIQD 237 mLs, Oral, Daily  . melatonin 3 mg, Oral, Daily at bedtime  . Multiple Vitamins-Minerals (CENTRUM SILVER PO) 1 tablet, Oral, Daily, 7am-10am.  . NON FORMULARY Moisturizing Cream to extremities with morning and evening care<BR>Every Shift  . NON FORMULARY Regular<BR>Special Instructions: Regular Diet, Thin Liquids  . omeprazole (PRILOSEC) 20 mg, Oral, Daily  . sertraline (ZOLOFT) 12.5 mg, Oral, Daily at bedtime  . timolol (BETIMOL) 0.5 % ophthalmic solution 1 drop, Both Eyes, Daily, 7am-10am.    14 ORGAN REVIEW OF SYSTEMS: Review of Systems  Constitutional: Negative for chills and fever.  HENT:  Negative for hoarse voice and nosebleeds.   Eyes: Negative for discharge, double vision and pain.  Cardiovascular: Negative for chest pain, claudication, dyspnea on exertion, leg swelling, near-syncope, orthopnea, palpitations, paroxysmal nocturnal dyspnea and syncope.  Respiratory: Negative for hemoptysis and shortness of breath.   Musculoskeletal: Positive for arthritis and falls. Negative for muscle cramps and myalgias.  Gastrointestinal: Negative for abdominal pain, constipation, diarrhea, hematemesis, hematochezia, melena, nausea and vomiting.  Neurological: Negative for dizziness and light-headedness.  All other systems reviewed and are negative.  PHYSICAL EXAM: Vitals with BMI 01/10/2020 01/10/2020 01/10/2020  Height - - -  Weight - - -  BMI - - -  Systolic 401 027 253  Diastolic 74 65 69  Pulse 62 65 65    No intake or output data in the 24 hours ending 01/10/20 1202  Net IO Since Admission: No IO data has been entered for this period [01/10/20 1202]  CONSTITUTIONAL: Elderly, frail Caucasian female, hemodynamically stable, no acute distress.    SKIN:  No cyanosis. No pallor. No jaundice HEAD: Normocephalic.  Small hematoma noted posteriorly on the scalp. EYES: No scleral icterus MOUTH/THROAT: Dry oral membranes.  NECK: No JVD present. No thyromegaly noted. No carotid bruits  LYMPHATIC: No visible cervical adenopathy.  CHEST Normal respiratory effort. No intercostal retractions  LUNGS: Clear to auscultation bilaterally.  No stridor. No wheezes. No rales.  CARDIOVASCULAR: Regular, positive G6-Y4, holosystolic murmur heard at  the left sternal border, no gallops or rubs. ABDOMINAL: Soft, nontender, nondistended, positive bowel sounds no apparent ascites.  EXTREMITIES: Cool to touch bilaterally, faint dorsalis and posterior tibial pulses, Kerlix wrapped over the left lower extremity. HEMATOLOGIC: Ecchymosis noted. NEUROLOGIC: Oriented to person and [place] [employer] [place].   Cannot recall date of birth, where she resides, today's date and year, location.  Strength 3+ in all 4 extremities, no facial asymmetry.  PSYCHIATRIC: Normal mood and affect. Normal behavior. Cooperative  RADIOLOGY: DG Ankle Complete Right  Result Date: 01/09/2020 CLINICAL DATA:  Right leg laceration. EXAM: RIGHT ANKLE - COMPLETE 3+ VIEW COMPARISON:  None. FINDINGS: There is no evidence of fracture, dislocation, or joint effusion. There is no evidence of arthropathy or other focal bone abnormality. A 7.7 cm superficial soft tissue defect is seen adjacent to the medial aspect of the distal right tibia. IMPRESSION: Superficial soft tissue defect adjacent to the medial aspect of the distal right tibia without an acute osseous abnormality. Electronically Signed   By: Virgina Norfolk M.D.   On: 01/09/2020 19:15   CT Head Wo Contrast  Result Date: 01/09/2020 CLINICAL DATA:  Mechanical fall from seated position EXAM: CT HEAD WITHOUT CONTRAST TECHNIQUE: Contiguous axial images were obtained from the base of the skull through the vertex without intravenous contrast. COMPARISON:  November 02, 2019 FINDINGS: Brain: No evidence of acute territorial infarction, hemorrhage, hydrocephalus,extra-axial collection or mass lesion/mass effect. There is dilatation the ventricles and sulci consistent with age-related atrophy. Low-attenuation changes in the deep white matter consistent with small vessel ischemia. Vascular: No hyperdense vessel or unexpected calcification. Skull: The skull is intact. No fracture or focal lesion identified. Sinuses/Orbits: The visualized paranasal sinuses and mastoid air cells are clear. The orbits and globes intact. Other: None IMPRESSION: No acute intracranial abnormality. Findings consistent with age related atrophy and chronic small vessel ischemia Electronically Signed   By: Prudencio Pair M.D.   On: 01/09/2020 19:07    LABORATORY DATA: Lab Results  Component Value Date   WBC 7.6 01/10/2020    HGB 10.9 (L) 01/10/2020   HCT 34.8 (L) 01/10/2020   MCV 99.7 01/10/2020   PLT 287 01/10/2020    Recent Labs  Lab 01/10/20 0448  NA 142  K 3.6  CL 110  CO2 24  BUN 14  CREATININE 0.69  CALCIUM 8.2*  GLUCOSE 86    Lipid Panel     Component Value Date/Time   CHOL 138 11/06/2019 0544   TRIG 86 11/06/2019 0544   HDL 49 11/06/2019 0544   CHOLHDL 2.8 11/06/2019 0544   VLDL 17 11/06/2019 0544   LDLCALC 72 11/06/2019 0544    BNP (last 3 results) No results for input(s): BNP in the last 8760 hours.  HEMOGLOBIN A1C Lab Results  Component Value Date   HGBA1C 5.9 (H) 11/04/2019   MPG 122.63 11/04/2019    Cardiac Panel (last 3 results) No results for input(s): CKTOTAL, CKMB, RELINDX in the last 8760 hours.  Invalid input(s): TROPONINHS  No results found for: CKTOTAL, CKMB, CKMBINDEX   TSH Recent Labs    11/02/19 1040  TSH 1.629      CARDIAC DATABASE: 11/03/2019: Marked sinus bradycardia at rate of 43 bpm, otherwise normal EKG.  11/06/2019: Atypical atrial flutter with 2: 1 conduction, ventricular rate 128 bpm.  Left axis deviation, left anterior fascicular block.  Poor R wave progression, cannot exclude anterolateral infarct old.  Tachycardia associated NSVT, suggests lateral ischemia.  01/09/2020 1822: Sinus bradycardia, 59  bpm, borderline left axis deviation, possible old anterior infarct, prolonged QT calculated QTC 632's milliseconds, nonspecific ST-T changes.  01/09/2020 1907: Sinus bradycardia, 59 bpm, QTC 554 ms.  Echocardiogram: 10/27/2019:  1. Left ventricular ejection fraction, by estimation, is 60 to 65%. The  left ventricle has normal function. The left ventricle has no regional  wall motion abnormalities. There is mild left ventricular hypertrophy.  Left ventricular diastolic parameters  are consistent with Grade II diastolic dysfunction (pseudonormalization).  Elevated left atrial pressure.  2. Right ventricular systolic function is normal. The  right ventricular  size is normal. There is mildly elevated pulmonary artery systolic  pressure. The estimated right ventricular systolic pressure is 94.4 mmHg.  3. Right atrial size was mildly dilated.  4. The mitral valve is normal in structure. Trivial mitral valve  regurgitation.  5. Tricuspid valve regurgitation is moderate to severe.  6. The aortic valve is tricuspid. Aortic valve regurgitation is mild.  Mild aortic valve sclerosis is present, with no evidence of aortic valve  stenosis.  7. The inferior vena cava is normal in size with greater than 50%  respiratory variability, suggesting right atrial pressure of 3 mmHg.   Carotid artery duplex  11/06/2019: Right Carotid: Velocities in the right ICA are consistent with a 1-39%  stenosis.  Left Carotid: Velocities in the left ICA are consistent with a 1-39%  stenosis.  Vertebrals: Bilateral vertebral arteries demonstrate antegrade flow.   IMPRESSION & RECOMMENDATIONS: Bianca [name] is a 86 y.o. Caucasian female whose past medical history and cardiovascular risk factors include: Paroxysmal SVT, paroxysmal atrial flutter, hyperlipidemia, grade 2 diastolic dysfunction, cognitive impairment suggestive of possible age-related dementia, history of stroke, postmenopausal female, advanced age.  Prolonged QT interval:  Patient does have a prolonged QT interval on surface ECG, underlying rhythm is sinus, and QRS complexes are narrow.  Hypokalemic on admission with a potassium level of 3.3.  Electrolytes replaced and potassium level this morning 3.4.  Will administer potassium chloride 10 mEq IV piggyback x2.  Keep a goal potassium of 4.0  Magnesium levels within normal limits.  Will give 2 g IV piggyback of magnesium sulfate magnesium levels greater than 2.2.  High sensitive troponin is within normal limits.  Check TSH to rule out thyroid disease.  Check orthostatic vital signs.  Patient is on antiarrhythmic medication such as  amiodarone, recommend holding it for now.  Patient is also on SSRIs which I will defer to primary team to either hold or wean off if clinically acceptable.  May also consider holding donezepil for now.  Check EKG daily to check QT interval.   Please admit to telemetry floor.  Continue to monitor for now.  Status post fall:  It appears patient had a mechanical fall prior to arrival to the hospital; however, since patient is a poor historian possibility of syncopal event cannot be entirely ruled out.  Educated on the importance of fall precautions.  Patient states that she tries to use a cane or walker as much as she can.  Recent echocardiogram in July 2021 noted preserved LVEF.  Additional details noted above.  She also had a carotid duplex in July 2021 which did not note any significant carotid artery atherosclerosis/stenosis.  Check orthostatic vital signs.  BUN/creatinine ratio suggestive of possible prerenal azotemia as well.  Consider maintenance fluids for 24hrs.   Paroxysmal atrial flutter:  Currently sinus rhythm.  Currently on thromboembolic prophylaxis given the elevated chads vas score.  Hold amiodarone for now given prolonged  QT interval.  Recommend rate control approach for now.  We will continue to monitor  Long-term oral anticoagulation: Indication paroxysmal atrial flutter.  Patient had a recent fall CT head performed in the ER results noted above.  Secondary diagnoses: Hyperlipidemia: Continue statin therapy.  Management primary team. Grade 2 diastolic dysfunction: Overall euvolemic.  Continue current medical therapy. Cognitive impairment suggestive of possible age-related dementia: Currently managed by primary team. History of stroke: Currently on statin therapy and continue risk factor modifications.  Patient's questions and concerns were addressed to her satisfaction. She voices understanding of the instructions provided during this encounter.    This note was created using a voice recognition software as a result there may be grammatical errors inadvertently enclosed that do not reflect the nature of this encounter. Every attempt is made to correct such errors.  Mechele Claude [facility]  Pager: [phone] Office: [phone] 01/10/2020, 12:02 PM

## 2020-01-10 NOTE — Progress Notes (Signed)
Consulted Dr. Terri Skains, Cardiology with below recommendations in consideration of amiodarone continuation and potential contribution to prolonged QTc on admission.  - Dr. Terri Skains plans to see patient  - Hold amiodarone for now  - Consider weaning off of Zoloft - repeat EKG

## 2020-01-10 NOTE — Progress Notes (Signed)
Family Medicine Teaching Service Daily Progress Note Intern Pager: [phone]  Patient name: Bianca [name] Medical record number: [id number] Date of birth: Jun 15, 1922 Age: 86 y.o. Gender: female  Primary Care Provider: [name], [name], NP Consultants: None Code Status: DNR  Pt Overview and Major Events to Date:  10/2: admitted for suspected syncopal event, prolonged QTc >600, 594  Assessment and Plan: Bianca [name] is a 86 y.o. female presenting after a fall from seated position with concern for syncope. PMH is significant for  dementia, SVT, transient bradycardia, A fib on Eliquis, hx CVA, emphysema, CKD.  Fall, possible syncope Patient presented after suspected syncopal episode sustaining injury to her head and right ankle in the process. Holding amiodarone and other potently QT prolonging medications that could cause abnormal HR/rhythm. No episodes of LOC reported overnight. No hx of seizures.  - cardiac monitoring - monitoring electrolytes - continue to hold amiodarone - tylenol prn pain  - f/u U/A - f/u PT/OT recs  - maintain K >4, Mag >2 (2.0 10/3)  - consider repeat Echo, MRI if believe would change management - cardiology consult to discuss continuing amiodarone  - continue home Eliquis   Right ankle laceration, stable  - Wound repaired, no changes overnight   Prolonged QTc  - F/u EKG this AM  - avoid QTc prolonging agents    Hx CVA At neurological baseline on presentation to ED. Follows commands demonstrates 4/5 strength bilaterally in upper and lower extremities. Able to converse, oriented to self and DOB only this AM.  - continue home Eliquis   Atrial Fibrillation  Bradycardia HR overnight ranged from 58-0121 overnight. Most recently 43.  - cardiac monitoring   Dementia Lives at [place].  Uses a walker for mobility.  Conversational at baseline, but short term memory is decreased.  Currently remains at neurological baseline, oriented to self and DOB.    - lights on during day to help with orientation  - orient patient regularly to location and date   CKD Baseline appears around 0.8-1.0.  Cr on admission 0.81. - monitor BMP  Emphysema Stable on RA.  No home meds.   - no intervention needed at present  Protein Calorie Malnutrition, Moderate  BMI 18.55  Patient frail appearing on exam.  - continue supplemental nutrition drinks while admitted   GERD Home meds: prilosec 20mg  QD - cont home meds - consider discontinuing PPI in patient given age  FEN/GI: dysphagia 3 diet   PPx: on eliquis   Subjective:  Patient reports she has no pain this AM. She is able to states name and DOB this morning.   Objective: Temp:  [98.3 F (36.8 C)] 98.3 F (36.8 C) (10/02 1823) Pulse Rate:  [58-121] 116 (10/03 0500) Resp:  [12-33] 15 (10/03 0000) BP: (102-167)/(61-125) 102/71 (10/03 0500) SpO2:  [87 %-97 %] 97 % (10/03 0500) Weight:  [43.1 kg] 43.1 kg (10/02 1829)  Physical Exam: General: elderly, frail appearing female lying in bed in NAD  Cardiovascular: tachycardia Respiratory: CTAB, normal WOB, on 2 L [place]  Abdomen: non tender to palpation, non distended, minimal bowel sounds  Extremities: RLE with laceration s/p repair   Laboratory: Recent Labs  Lab 01/09/20 1820 01/10/20 0448  WBC 6.9 7.6  HGB 11.0* 10.9*  HCT 34.4* 34.8*  PLT 305 287   Recent Labs  Lab 01/09/20 1820 01/10/20 0448  NA 138 142  K 3.3* 3.6  CL 107 110  CO2 22 24  BUN 20 14  CREATININE 0.81 0.69  CALCIUM 8.1* 8.2*  GLUCOSE 101* 86    Imaging/Diagnostic Tests: DG Ankle Complete Right  Result Date: 01/09/2020 CLINICAL DATA:  Right leg laceration. EXAM: RIGHT ANKLE - COMPLETE 3+ VIEW COMPARISON:  None. FINDINGS: There is no evidence of fracture, dislocation, or joint effusion. There is no evidence of arthropathy or other focal bone abnormality. A 7.7 cm superficial soft tissue defect is seen adjacent to the medial aspect of the distal right tibia.  IMPRESSION: Superficial soft tissue defect adjacent to the medial aspect of the distal right tibia without an acute osseous abnormality. Electronically Signed   By: Virgina Norfolk M.D.   On: 01/09/2020 19:15   CT Head Wo Contrast  Result Date: 01/09/2020 CLINICAL DATA:  Mechanical fall from seated position EXAM: CT HEAD WITHOUT CONTRAST TECHNIQUE: Contiguous axial images were obtained from the base of the skull through the vertex without intravenous contrast. COMPARISON:  November 02, 2019 FINDINGS: Brain: No evidence of acute territorial infarction, hemorrhage, hydrocephalus,extra-axial collection or mass lesion/mass effect. There is dilatation the ventricles and sulci consistent with age-related atrophy. Low-attenuation changes in the deep white matter consistent with small vessel ischemia. Vascular: No hyperdense vessel or unexpected calcification. Skull: The skull is intact. No fracture or focal lesion identified. Sinuses/Orbits: The visualized paranasal sinuses and mastoid air cells are clear. The orbits and globes intact. Other: None IMPRESSION: No acute intracranial abnormality. Findings consistent with age related atrophy and chronic small vessel ischemia Electronically Signed   By: Prudencio Pair M.D.   On: 01/09/2020 19:07    Eulis Foster, MD 01/10/2020, 6:10 AM PGY-2, [facility] Intern pager: [phone], text pages welcome

## 2020-01-11 ENCOUNTER — Other Ambulatory Visit: Payer: Self-pay

## 2020-01-11 DIAGNOSIS — R55 Syncope and collapse: Secondary | ICD-10-CM

## 2020-01-11 LAB — CBC WITH DIFFERENTIAL/PLATELET
Abs Immature Granulocytes: 0.04 10*3/uL (ref 0.00–0.07)
Basophils Absolute: 0 10*3/uL (ref 0.0–0.1)
Basophils Relative: 0 %
Eosinophils Absolute: 0.3 10*3/uL (ref 0.0–0.5)
Eosinophils Relative: 3 %
HCT: 32.9 % — ABNORMAL LOW (ref 36.0–46.0)
Hemoglobin: 10.4 g/dL — ABNORMAL LOW (ref 12.0–15.0)
Immature Granulocytes: 1 %
Lymphocytes Relative: 11 %
Lymphs Abs: 0.8 10*3/uL (ref 0.7–4.0)
MCH: 31.5 pg (ref 26.0–34.0)
MCHC: 31.6 g/dL (ref 30.0–36.0)
MCV: 99.7 fL (ref 80.0–100.0)
Monocytes Absolute: 0.8 10*3/uL (ref 0.1–1.0)
Monocytes Relative: 10 %
Neutro Abs: 5.6 10*3/uL (ref 1.7–7.7)
Neutrophils Relative %: 75 %
Platelets: 281 10*3/uL (ref 150–400)
RBC: 3.3 MIL/uL — ABNORMAL LOW (ref 3.87–5.11)
RDW: 18.4 % — ABNORMAL HIGH (ref 11.5–15.5)
WBC: 7.5 10*3/uL (ref 4.0–10.5)
nRBC: 0 % (ref 0.0–0.2)

## 2020-01-11 LAB — TSH: TSH: 4.06 u[IU]/mL (ref 0.350–4.500)

## 2020-01-11 LAB — BASIC METABOLIC PANEL
Anion gap: 5 (ref 5–15)
BUN: 15 mg/dL (ref 8–23)
CO2: 28 mmol/L (ref 22–32)
Calcium: 8 mg/dL — ABNORMAL LOW (ref 8.9–10.3)
Chloride: 109 mmol/L (ref 98–111)
Creatinine, Ser: 0.84 mg/dL (ref 0.44–1.00)
GFR calc Af Amer: >60 mL/min (ref >60)
GFR calc non Af Amer: 59 mL/min — ABNORMAL LOW (ref >60)
Glucose, Bld: 91 mg/dL (ref 70–99)
Potassium: 4 mmol/L (ref 3.5–5.1)
Sodium: 142 mmol/L (ref 135–145)

## 2020-01-11 LAB — SARS CORONAVIRUS 2 BY RT PCR (HOSPITAL ORDER, PERFORMED IN ~~LOC~~ HOSPITAL LAB): SARS Coronavirus 2: NEGATIVE

## 2020-01-11 LAB — GLUCOSE, CAPILLARY: Glucose-Capillary: 92 mg/dL (ref 70–99)

## 2020-01-11 MED ORDER — METOPROLOL TARTRATE 12.5 MG HALF TABLET
12.5000 mg | ORAL_TABLET | Freq: Two times a day (BID) | ORAL | Status: DC
  Administered 2020-01-11 – 2020-01-12 (×2): 12.5 mg via ORAL
  Filled 2020-01-11 (×2): qty 1

## 2020-01-11 MED ORDER — COLLAGENASE 250 UNIT/GM EX OINT
TOPICAL_OINTMENT | Freq: Every day | CUTANEOUS | Status: DC
  Administered 2020-01-11: 1 via TOPICAL
  Filled 2020-01-11: qty 30

## 2020-01-11 NOTE — Plan of Care (Signed)
  Problem: Education: Goal: Knowledge of General Education information will improve Description Including pain rating scale, medication(s)/side effects and non-pharmacologic comfort measures Outcome: Progressing   

## 2020-01-11 NOTE — Progress Notes (Signed)
Called nephew Liliane Channel and updated on medical workup and plan to discharge to SNF when bed available.  Zola Button, MD

## 2020-01-11 NOTE — Progress Notes (Signed)
Patient arrived to unit from ED via bed; transferred to low bed with assist on ED transporting staff, [employer] and myself. Pt tolerated transfer well. She is alert and oriented to person, place and situation at this time but is forgetful at baseline. She does not think that it was today when she fell; she thinks it was " few days ago". Pt is a thin, frail caucasion woman with thin, fragile, ecchymotic skin. She had a fall today and sustained a right occipital lobe hematoma, right lower anterior tibial laceration that has been sutured in the ED.  Sutures. The wound is not fully approximated at the skin is not present over the entire laceration site. Wound bed of laceration site is red. She also has a dime sized pressure area on her right lateral hip that is unstageable, wound bed is covered with yellow slough. Her skin in general is dry, flaky thin, fragile and ecchymotic to lower extremites and upper extremities. She has an abrasion to her left posterior elbow that has been left open to air. No open areas noted just redness ad a "scuffed "apprearance. Wound care consult has been ordered. Lungs are CTA, slightly diminished in bases. HRR, respirations even and unlabored on room air. She wears dentures (upper and lower are in place) and glasses, she is mildly HOH. Pt is not able to provide accurate history. She has been oriented to room, bed alarm is on and callbell is in reach. Pt denies pain or distress. 01/10/2020 @ 2045 Cyndi Bender, RN

## 2020-01-11 NOTE — Progress Notes (Signed)
Progress Note  Patient Name: Bianca [name] Date of Encounter: 01/11/2020  Attending physician: Leeanne Rio, MD Primary care provider: [name], [name], NP Inpatient cardiology consultant:Giovana Faciane [place], [place]  Subjective: Bianca [name] is a 86 y.o. female who was seen and examined at bedside at approximately 8:30 AM. No events overnight. Patient denies any chest pain or shortness of breath at rest or with effort related activities. Resting in bed comfortably.  Objective: Vital Signs in the last 24 hours: Temp:  [97.5 F (36.4 C)-98.6 F (37 C)] 97.6 F (36.4 C) (10/04 1508) Pulse Rate:  [57-74] 59 (10/04 1508) Resp:  [17-18] 18 (10/04 1508) BP: (112-167)/(58-79) 112/58 (10/04 1508) SpO2:  [92 %-98 %] 92 % (10/04 1508) Weight:  [42.6 kg] 42.6 kg (10/04 0341)  Weights:  Filed Weights   01/09/20 1829 01/11/20 0341  Weight: 43.1 kg 42.6 kg    Telemetry: Personally reviewed.  Normal sinus rhythm with rare ectopy.  Physical examination: PHYSICAL EXAM: Vitals with BMI 01/11/2020 01/10/2020 01/10/2020  Height - - -  Weight 94 lbs - -  BMI 12.45 - -  Systolic 809 983 382  Diastolic 58 60 79  Pulse 59 57 74    CONSTITUTIONAL: Elderly, frail Caucasian female, hemodynamically stable, no acute distress.    SKIN:  No cyanosis. No pallor. No jaundice HEAD: Normocephalic.  Small hematoma noted posteriorly on the scalp. EYES: No scleral icterus MOUTH/THROAT: Dry oral membranes.  NECK: No JVD present. No thyromegaly noted. No carotid bruits  LYMPHATIC: No visible cervical adenopathy.  CHEST Normal respiratory effort. No intercostal retractions  LUNGS: Clear to auscultation bilaterally.  No stridor. No wheezes. No rales.  CARDIOVASCULAR: Regular, positive N0-N3, holosystolic murmur heard at the left sternal border, no gallops or rubs. ABDOMINAL: Soft, nontender, nondistended, positive bowel sounds no apparent ascites.  EXTREMITIES: Faint dorsalis and posterior tibial  pulses, Band-Aid over the left lower extremity. HEMATOLOGIC: Ecchymosis noted. NEUROLOGIC: Oriented to person and [place].  Cannot recall date of birth, where she resides, today's date and year, location.  Strength 3+ in all 4 extremities, no facial asymmetry.  PSYCHIATRIC: Normal mood and affect. Normal behavior. Cooperative  Lab Results: Hematology Recent Labs  Lab 01/09/20 1820 01/10/20 0448 01/11/20 0154  WBC 6.9 7.6 7.5  RBC 3.45* 3.49* 3.30*  HGB 11.0* 10.9* 10.4*  HCT 34.4* 34.8* 32.9*  MCV 99.7 99.7 99.7  MCH 31.9 31.2 31.5  MCHC 32.0 31.3 31.6  RDW 18.5* 18.3* 18.4*  PLT 305 287 281    Chemistry Recent Labs  Lab 01/09/20 1820 01/10/20 0448 01/11/20 0154  NA 138 142 142  K 3.3* 3.6 4.0  CL 107 110 109  CO2 22 24 28   GLUCOSE 101* 86 91  BUN 20 14 15   CREATININE 0.81 0.69 0.84  CALCIUM 8.1* 8.2* 8.0*  GFRNONAA >60 >60 59*  GFRAA >60 >60 >60  ANIONGAP 9 8 5      Cardiac Enzymes: Cardiac Panel (last 3 results) Recent Labs    01/09/20 2238 01/10/20 0351  TROPONINIHS 14 14    BNP (last 3 results) No results for input(s): BNP in the last 8760 hours.  ProBNP (last 3 results) No results for input(s): PROBNP in the last 8760 hours.   DDimer No results for input(s): DDIMER in the last 168 hours.   Hemoglobin A1c:  Lab Results  Component Value Date   HGBA1C 5.9 (H) 11/04/2019   MPG 122.63 11/04/2019    TSH  Recent Labs  11/02/19 1040 01/11/20 0154  TSH 1.629 4.060    Lipid Panel     Component Value Date/Time   CHOL 138 11/06/2019 0544   TRIG 86 11/06/2019 0544   HDL 49 11/06/2019 0544   CHOLHDL 2.8 11/06/2019 0544   VLDL 17 11/06/2019 0544   LDLCALC 72 11/06/2019 0544    Imaging: DG Ankle Complete Right  Result Date: 01/09/2020 CLINICAL DATA:  Right leg laceration. EXAM: RIGHT ANKLE - COMPLETE 3+ VIEW COMPARISON:  None. FINDINGS: There is no evidence of fracture, dislocation, or joint effusion. There is no  evidence of arthropathy or other focal bone abnormality. A 7.7 cm superficial soft tissue defect is seen adjacent to the medial aspect of the distal right tibia. IMPRESSION: Superficial soft tissue defect adjacent to the medial aspect of the distal right tibia without an acute osseous abnormality. Electronically Signed   By: Virgina Norfolk M.D.   On: 01/09/2020 19:15    Cardiac database: 11/03/2019: Marked sinus bradycardia at rate of 43 bpm, otherwise normal EKG.  11/06/2019: Atypical atrial flutter with 2: 1 conduction, ventricular rate 128 bpm. Left axis deviation, left anterior fascicular block. Poor R wave progression, cannot exclude anterolateral infarct old. Tachycardia associated NSVT, suggests lateral ischemia.  01/09/2020 1822: Sinus bradycardia, 59 bpm, borderline left axis deviation, possible old anterior infarct, prolonged QT calculated QTC 632 milliseconds, nonspecific ST-T changes.  01/09/2020 1907: Sinus bradycardia, 59 bpm, QTC 554 ms.  01/11/2020: Normal sinus rhythm, 61 bpm, first-degree AV block, left axis deviation, calculated QTC 465 ms.  Echocardiogram: 10/27/2019: 1. Left ventricular ejection fraction, by estimation, is 60 to 65%. The left ventricle has normal function. The left ventricle has no regional  wall motion abnormalities. There is mild left ventricular hypertrophy. Left ventricular diastolic parameters  are consistent with Grade II diastolic dysfunction (pseudonormalization). Elevated left atrial pressure.  2. Right ventricular systolic function is normal. The right ventricular size is normal. There is mildly elevated pulmonary artery systolic  pressure. The estimated right ventricular systolic pressure is 85.6 mmHg.  3. Right atrial size was mildly dilated.  4. The mitral valve is normal in structure. Trivial mitral valve regurgitation.  5. Tricuspid valve regurgitation is moderate to severe.  6. The aortic valve is tricuspid. Aortic valve  regurgitation is mild. Mild aortic valve sclerosis is present, with no evidence of aortic valve  stenosis.  7. The inferior vena cava is normal in size with greater than 50% respiratory variability, suggesting right atrial pressure of 3 mmHg.   Carotid artery duplex07/30/2021: Right Carotid: Velocities in the right ICA are consistent with a 1-39% stenosis.  Left Carotid: Velocities in the left ICA are consistent with a 1-39% stenosis.  Vertebrals: Bilateral vertebral arteries demonstrate antegrade flow.   Scheduled Meds: . apixaban  2.5 mg Oral BID  . atorvastatin  20 mg Oral q1800  . collagenase   Topical Daily  . lactose free nutrition  237 mL Oral Daily  . melatonin  3 mg Oral QHS  . metoprolol tartrate  12.5 mg Oral BID  . pantoprazole  40 mg Oral Daily  . sertraline  12.5 mg Oral QHS  . sodium chloride flush  3 mL Intravenous Q12H  . timolol  1 drop Both Eyes Daily   PRN Meds: acetaminophen **OR** acetaminophen   IMPRESSION & RECOMMENDATIONS: TALLIA MOEHRING is a 86 y.o. female whose past medical history and cardiac risk factors include: Paroxysmal SVT, paroxysmal atrial flutter, hyperlipidemia, grade 2 diastolic dysfunction, cognitive impairment suggestive of possible  age-related dementia, history of stroke, postmenopausal female, advanced age.  Prolonged QT interval: Improving.  Hypokalemia resolved.  Serum potassium level this morning is 4.0.  Magnesium within normal limits.  TSH within normal limits.  Recommend holding amiodarone for now.  Patient is also on SSRIs which I will defer to primary team to either hold or wean off if clinically acceptable.  Start Lopressor 12.5 mg p.o. twice daily, hold if systolic blood pressure less than 100 mmHg or heart rate less than 60 bpm.  Continue to monitor.  Status post fall: Management per primary team.   Paroxysmal atrial flutter:  Currently sinus rhythm.  Currently on thromboembolic prophylaxis given the  elevated chads vas score.  Hold amiodarone for now given prolonged QT interval.  Recommend rate control approach for now.  Start Lopressor.  We will continue to monitor  Long-term oral anticoagulation: Indication paroxysmal atrial flutter.  Patient had a recent fall CT head performed in the ER results noted above.  Secondary diagnoses: Hyperlipidemia: Continue statin therapy.  Management primary team. Grade 2 diastolic dysfunction: Overall euvolemic.  Continue current medical therapy. Cognitive impairment suggestive of possible age-related dementia: Currently managed by primary team. History of stroke: Currently on statin therapy and continue risk factor modifications.  Patient is progressing well from a cardiovascular standpoint during his hospitalization.  Please call if any questions arise.  Once discharge patient can follow-up with [place] in 4 weeks.  Patient's questions and concerns were addressed to her satisfaction. She voices understanding of the instructions provided during this encounter.   This note was created using a voice recognition software as a result there may be grammatical errors inadvertently enclosed that do not reflect the nature of this encounter. Every attempt is made to correct such errors.  Rex Kras, DO, [facility]. [place] Office: [phone] 01/11/2020, 6:54 PM

## 2020-01-11 NOTE — TOC Initial Note (Addendum)
Transition of Care [facility]) - Initial/Assessment Note    Patient Details  Name: Bianca [name] MRN: [id number] Date of Birth: Jul 09, 1922  [facility] CM/SW Contact:    Alexander Mt, LCSW Phone Number: 01/11/2020, 12:36 PM  Clinical Narrative:                 CSW spoke with pt nephew Bianca [name] via telephone. Introduced self, role, reason for call. Pt from [place]. She has lived there for about 4 months and in the ILF portion of the community since about 1999. She usually uses a rollator or walker. We discussed recommendations- at this time CSW and pt son discussed placement at SNF for supervision given that ALF doesn't have 24/7 supervision following this fall. Pt nephew works in Theatre manager at [employer].   CSW has reached out to [facility] covering for [employer]. [facility] states they dont have a skilled bed at this time but [place] does, CSW has left a Advertising account executive with [facility] to discuss. TOC team continuing to follow.   CSW also left message for MD team to call Bianca [name] with update when possible.   Expected Discharge Plan: Skilled Nursing Facility Barriers to Discharge: Continued Medical Work up   Patient Goals and CMS Choice Patient states their goals for this hospitalization and ongoing recovery are:: return to [facility]; poss short stay in SNF side CMS Medicare.gov Compare Post Acute Care list provided to:: Patient Represenative (must comment) (pt nephew Bianca [name]; pt is LTC resident at [facility]) Choice offered to / list presented to :  (pt nephew Higher education careers adviser)  Expected Discharge Plan and Services Expected Discharge Plan: Skilled Nursing Facility In-house Referral: Clinical Social Work Discharge Planning Services: CM Consult Post Acute Care Choice: [place] Living arrangements for the past 2 months: [place]  Prior Living Arrangements/Services Living arrangements for the past 2 months: [place] Lives with:: Facility  Resident Patient language and need for interpreter reviewed:: Yes (no needs) Do you feel safe going back to the place where you live?: Yes      Need for Family Participation in Patient Care: Yes (Comment) (support; assistance) Care giver support system in place?: Yes (comment) (nephews; facility staff) Current home services: DME Criminal Activity/Legal Involvement Pertinent to Current Situation/Hospitalization: No - Comment as needed  Activities of Daily Living Home Assistive Devices/Equipment: Environmental consultant (specify type) ADL Screening (condition at time of admission) Patient's cognitive ability adequate to safely complete daily activities?: No Is the patient deaf or have difficulty hearing?: Yes Does the patient have difficulty seeing, even when wearing glasses/contacts?: Yes Does the patient have difficulty concentrating, remembering, or making decisions?: Yes Patient able to express need for assistance with ADLs?: No Does the patient have difficulty dressing or bathing?: Yes Independently performs ADLs?: No Communication: Needs assistance (hx dementia/memory impairment) Is this a change from baseline?: Pre-admission baseline Dressing (OT): Needs assistance Is this a change from baseline?: Pre-admission baseline Grooming: Needs assistance Is this a change from baseline?: Pre-admission baseline Feeding: Needs assistance Is this a change from baseline?: Pre-admission baseline Bathing: Needs assistance Is this a change from baseline?: Pre-admission baseline Toileting: Needs assistance Is this a change from baseline?: Pre-admission baseline In/Out Bed: Needs assistance Is this a change from baseline?: Change from baseline, expected to last <3 days Walks in Home: Needs assistance Is this a change from baseline?: Change from baseline, expected to last <3 days Does the patient have difficulty walking or climbing stairs?: Yes Weakness  of Legs: Both Weakness of Arms/Hands: None  Permission  Sought/Granted Permission sought to share information with : Family Supports Permission granted to share information with : No (per RN pt only oriented to self)  Share Information with NAME: Bianca [name]  Permission granted to share info w AGENCY: [place] granted to share info w Relationship: nephew  Permission granted to share info w Contact Information: [phone]  Emotional Assessment Appearance:: Other (Comment Required (telephone assessment w/ pt nephew) Attitude/Demeanor/Rapport: Other (comment) (telephone assessment w/ pt nephew) Affect (typically observed): Other (comment) (telephone assessment w/ pt nephew) Orientation: : Oriented to Self, Fluctuating Orientation (Suspected and/or reported Sundowners) Alcohol / Substance Use: Not Applicable Psych Involvement: No (comment)  Admission diagnosis:  Fall [W19.XXXA] Prolonged QT interval [R94.31] Injury of head, initial encounter [S09.90XA] Laceration of right lower extremity, initial encounter [S81.811A] Syncope, unspecified syncope type [R55] Patient Active Problem List   Diagnosis Date Noted  . Prolonged QT interval   . Paroxysmal atrial flutter ([place])   . Long term current use of anticoagulant   . Mixed hyperlipidemia   . History of stroke   . Diastolic dysfunction   . Fall 01/09/2020  . Adjustment disorder with mixed anxiety and depressed mood 11/07/2019  . Stroke ([place]) 11/05/2019  . Tachycardia 11/03/2019  . Hypotension 11/03/2019  . Sinus bradycardia 11/02/2019  . Acute lower UTI 11/02/2019  . Memory deficit 10/26/2019  . CKD (chronic kidney disease) stage 3, GFR 30-59 ml/min (HCC) 09/30/2019  . Edema 06/11/2019  . Metatarsalgia 06/11/2019  . GERD (gastroesophageal reflux disease) 05/14/2019  . Paroxysmal SVT (supraventricular tachycardia) ([place]) 04/27/2019  . Pneumonia due to COVID-19 virus 04/27/2019  . Chronic diarrhea 04/27/2019  . Urinary frequency 04/27/2019  . Protein-calorie malnutrition  ([place]) 04/27/2019  . Emphysema of lung ([place]) 04/27/2019   PCP:  [name], [name], NP Pharmacy:   [facility] [address] [place] - [address] [place] [address] Phone: [phone] Fax: [phone]   Readmission Risk Interventions Readmission Risk Prevention Plan 01/11/2020  Transportation Screening Complete  PCP or Specialist Appt within 5-7 Days Complete  Home Care Screening Complete  Medication Review (RN CM) Referral to Pharmacy  Some recent data might be hidden

## 2020-01-11 NOTE — NC FL2 (Signed)
[place] LEVEL OF CARE SCREENING TOOL     IDENTIFICATION  Patient Name: Bianca [name] Birthdate: Apr 12, 1922 Sex: female Admission Date (Current Location): 01/09/2020  [facility] and [place] Number:  Herbalist and Address:  [facility], [place] [address] [place], [place] [address]      Provider Number: [id number]  Attending Physician Name and Address:  Leeanne Rio, MD  Relative Name and Phone Number:       Current Level of Care: Hospital Recommended Level of Care: [place] Prior Approval Number:    Date Approved/Denied:   PASRR Number: [id number]  Discharge Plan: SNF    Current Diagnoses: Patient Active Problem List   Diagnosis Date Noted  . Prolonged QT interval   . Paroxysmal atrial flutter ([place])   . Long term current use of anticoagulant   . Mixed hyperlipidemia   . History of stroke   . Diastolic dysfunction   . Fall 01/09/2020  . Adjustment disorder with mixed anxiety and depressed mood 11/07/2019  . Stroke ([place]) 11/05/2019  . Tachycardia 11/03/2019  . Hypotension 11/03/2019  . Sinus bradycardia 11/02/2019  . Acute lower UTI 11/02/2019  . Memory deficit 10/26/2019  . CKD (chronic kidney disease) stage 3, GFR 30-59 ml/min (HCC) 09/30/2019  . Edema 06/11/2019  . Metatarsalgia 06/11/2019  . GERD (gastroesophageal reflux disease) 05/14/2019  . Paroxysmal SVT (supraventricular tachycardia) ([place]) 04/27/2019  . Pneumonia due to COVID-19 virus 04/27/2019  . Chronic diarrhea 04/27/2019  . Urinary frequency 04/27/2019  . Protein-calorie malnutrition ([place]) 04/27/2019  . Emphysema of lung ([place]) 04/27/2019    Orientation RESPIRATION BLADDER Height & Weight     Self  Normal Incontinent Weight: 94 lb (42.6 kg) Height:  5' (152.4 cm)  BEHAVIORAL SYMPTOMS/MOOD NEUROLOGICAL BOWEL NUTRITION STATUS      Continent Diet  AMBULATORY STATUS COMMUNICATION OF NEEDS Skin   Extensive Assist Verbally  Surgical wounds, Skin abrasions, Other (Comment), PU Stage and Appropriate Care (unstageable on hip; abrasions on L leg and elbow; closed incision on L leg with stitches)                       Personal Care Assistance Level of Assistance  Bathing, Feeding, Dressing Bathing Assistance: Maximum assistance Feeding assistance: Independent Dressing Assistance: Maximum assistance     Functional Limitations Info  Sight, Hearing, Speech Sight Info: Adequate Hearing Info: Adequate Speech Info: Adequate    SPECIAL CARE FACTORS FREQUENCY  PT (By licensed PT), OT (By licensed OT)     PT Frequency: 5x week OT Frequency: 5x week            Contractures Contractures Info: Not present    Additional Factors Info  Code Status, Allergies, Psychotropic Code Status Info: DNR Allergies Info: Azopt (Brinzolamide), Brimonidine, Lumigan (Bimatoprost) Psychotropic Info: sertraline (ZOLOFT) tablet 12.5 mg daily at bedtime         Current Medications (01/11/2020):  This is the current hospital active medication list Current Facility-Administered Medications  Medication Dose Route Frequency Provider Last Rate Last Admin  . acetaminophen (TYLENOL) tablet 650 mg  650 mg Oral Q6H PRN Simmons-Robinson, Makiera, MD       Or  . acetaminophen (TYLENOL) suppository 650 mg  650 mg Rectal Q6H PRN Simmons-Robinson, Makiera, MD      . apixaban (ELIQUIS) tablet 2.5 mg  2.5 mg Oral BID Simmons-Robinson, Makiera, MD   2.5 mg at 01/10/20 0843  . atorvastatin (LIPITOR)  tablet 20 mg  20 mg Oral q1800 Simmons-Robinson, Makiera, MD   20 mg at 01/10/20 1933  . collagenase (SANTYL) ointment   Topical Daily Leeanne Rio, MD      . lactose free nutrition (Boost) liquid 237 mL  237 mL Oral Daily Simmons-Robinson, Makiera, MD   237 mL at 01/10/20 1245  . melatonin tablet 3 mg  3 mg Oral QHS Simmons-Robinson, Makiera, MD   3 mg at 01/10/20 2253  . pantoprazole (PROTONIX) EC tablet 40 mg  40 mg Oral Daily  Simmons-Robinson, Makiera, MD   40 mg at 01/10/20 0829  . sertraline (ZOLOFT) tablet 12.5 mg  12.5 mg Oral QHS Simmons-Robinson, Makiera, MD   12.5 mg at 01/10/20 0022  . sodium chloride flush (NS) 0.9 % injection 3 mL  3 mL Intravenous Q12H Simmons-Robinson, Makiera, MD   3 mL at 01/10/20 2253  . timolol (TIMOPTIC) 0.5 % ophthalmic solution 1 drop  1 drop Both Eyes Daily Simmons-Robinson, Makiera, MD         Discharge Medications: Please see discharge summary for a list of discharge medications.  Relevant Imaging Results:  Relevant Lab Results:   Additional Information SS[id number] [place], [place]

## 2020-01-11 NOTE — Consult Note (Signed)
WOC Nurse Consult Note: Reason for Consult: Right hip wound Wound type: unstageable Pressure Injury POA: Yes Measurement: 1 cm x 0.8 cm Wound bed: 100% yellow Drainage (amount, consistency, odor) None Periwound: Intact Dressing procedure/placement/frequency: Apply Santyl to right hip wound in a nickel thick layer. Cover with a saline moistened gauze, then foam dressing.  Change daily.  Monitor the wound area(s) for worsening of condition such as: Signs/symptoms of infection, increase in size, development of or worsening of odor, development of pain, or increased pain at the affected locations.   Notify the medical team if any of these develop.  Thank you for the consult. [place] nurse will not follow at this time.   Please re-consult the [place] team if needed.  Cathlean Marseilles Tamala Julian, MSN, RN, [place], Lysle Pearl, [facility] Wound Treatment Associate Pager [phone]

## 2020-01-11 NOTE — Progress Notes (Signed)
Family Medicine Teaching Service Daily Progress Note Intern Pager: [phone]  Patient name: Bianca [name] Medical record number: [id number] Date of birth: [birth year] Age: 86 y.o. Gender: female  Primary Care Provider: [name], [name], NP Consultants: Cardiology   Code Status: DNR  Pt Overview and Major Events to Date:  10/2: admitted for suspected syncopal event, prolonged QTc >600, 594  Assessment and Plan: Bianca [name] is a 86 y.o. female presenting after a fall from seated positionwith concern for syncope. PMH is significant fordementia, SVT, transient bradycardia, A fib on Eliquis, hx CVA, emphysema, CKD. Medically stable for discharge.  Fall Suspected syncopal episode with injury to head and right ankle.  Findings of prolonged QT on admission. - holding amiodarone - tylenol prn  Right ankle laceration Wound repaired, stable.  Prolonged QTc Normal QTc seen on EKG this morning.  A Fib Was on amiodarone, discontinued due to prolonged QTc.  Cardiology following, appreciate involvement.  Recommendation for rate control. - dc amiodarone - metoprolol tartrate 12.5 mg BID per cardiology - home apixaban  Protein calorie malnutrition Moderate. BMI 18 - supplemental nutrition while admitted  Other problems chronic and stable: History of CVA Dementia CKD Emphysema  FEN/GI: DYS 3 PPx: apixaban  Disposition: SNF when bed available  Subjective:  No complaints this morning.  Family requesting her go to [facility] (where she is a current ALF resident).  Objective: Temp:  [97.5 F (36.4 C)-98.6 F (37 C)] 97.5 F (36.4 C) (10/03 2347) Pulse Rate:  [57-74] 57 (10/03 2347) Resp:  [17-21] 17 (10/03 2347) BP: (116-167)/(60-79) 116/60 (10/03 2347) SpO2:  [92 %-98 %] 96 % (10/03 2347) Weight:  [42.6 kg] 42.6 kg (10/04 0341) Physical Exam: General: frail elderly woman lying in bed, NAD Cardiovascular: RRR Respiratory: CTAB, on room air Abdomen: soft, non-tender,  +BS Extremities: WWP, no edema  Laboratory: Recent Labs  Lab 01/09/20 1820 01/10/20 0448 01/11/20 0154  WBC 6.9 7.6 7.5  HGB 11.0* 10.9* 10.4*  HCT 34.4* 34.8* 32.9*  PLT 305 287 281   Recent Labs  Lab 01/09/20 1820 01/10/20 0448 01/11/20 0154  NA 138 142 142  K 3.3* 3.6 4.0  CL 107 110 109  CO2 22 24 28   BUN 20 14 15   CREATININE 0.81 0.69 0.84  CALCIUM 8.1* 8.2* 8.0*  GLUCOSE 101* 86 91     Imaging/Diagnostic Tests: No new imaging.  Zola Button, MD 01/11/2020, 9:50 AM PGY-1, [place] Intern pager: [phone], text pages welcome

## 2020-01-12 DIAGNOSIS — H353220 Exudative age-related macular degeneration, left eye, stage unspecified: Secondary | ICD-10-CM | POA: Diagnosis not present

## 2020-01-12 DIAGNOSIS — I484 Atypical atrial flutter: Secondary | ICD-10-CM | POA: Diagnosis not present

## 2020-01-12 DIAGNOSIS — R609 Edema, unspecified: Secondary | ICD-10-CM | POA: Diagnosis not present

## 2020-01-12 DIAGNOSIS — T148XXA Other injury of unspecified body region, initial encounter: Secondary | ICD-10-CM | POA: Diagnosis not present

## 2020-01-12 DIAGNOSIS — Z681 Body mass index (BMI) 19 or less, adult: Secondary | ICD-10-CM | POA: Diagnosis not present

## 2020-01-12 DIAGNOSIS — Z20822 Contact with and (suspected) exposure to covid-19: Secondary | ICD-10-CM | POA: Diagnosis not present

## 2020-01-12 DIAGNOSIS — R350 Frequency of micturition: Secondary | ICD-10-CM | POA: Diagnosis not present

## 2020-01-12 DIAGNOSIS — R1311 Dysphagia, oral phase: Secondary | ICD-10-CM | POA: Diagnosis not present

## 2020-01-12 DIAGNOSIS — R1310 Dysphagia, unspecified: Secondary | ICD-10-CM | POA: Diagnosis not present

## 2020-01-12 DIAGNOSIS — R9431 Abnormal electrocardiogram [ECG] [EKG]: Secondary | ICD-10-CM | POA: Diagnosis not present

## 2020-01-12 DIAGNOSIS — E440 Moderate protein-calorie malnutrition: Secondary | ICD-10-CM | POA: Diagnosis not present

## 2020-01-12 DIAGNOSIS — I4581 Long QT syndrome: Secondary | ICD-10-CM | POA: Diagnosis not present

## 2020-01-12 DIAGNOSIS — M6281 Muscle weakness (generalized): Secondary | ICD-10-CM | POA: Diagnosis not present

## 2020-01-12 DIAGNOSIS — R7989 Other specified abnormal findings of blood chemistry: Secondary | ICD-10-CM | POA: Diagnosis not present

## 2020-01-12 DIAGNOSIS — B029 Zoster without complications: Secondary | ICD-10-CM | POA: Diagnosis not present

## 2020-01-12 DIAGNOSIS — N183 Chronic kidney disease, stage 3 unspecified: Secondary | ICD-10-CM | POA: Diagnosis not present

## 2020-01-12 DIAGNOSIS — Z66 Do not resuscitate: Secondary | ICD-10-CM | POA: Diagnosis not present

## 2020-01-12 DIAGNOSIS — I639 Cerebral infarction, unspecified: Secondary | ICD-10-CM | POA: Diagnosis not present

## 2020-01-12 DIAGNOSIS — R000 Tachycardia, unspecified: Secondary | ICD-10-CM | POA: Diagnosis not present

## 2020-01-12 DIAGNOSIS — K219 Gastro-esophageal reflux disease without esophagitis: Secondary | ICD-10-CM | POA: Diagnosis not present

## 2020-01-12 DIAGNOSIS — E782 Mixed hyperlipidemia: Secondary | ICD-10-CM | POA: Diagnosis not present

## 2020-01-12 DIAGNOSIS — M2550 Pain in unspecified joint: Secondary | ICD-10-CM | POA: Diagnosis not present

## 2020-01-12 DIAGNOSIS — R6889 Other general symptoms and signs: Secondary | ICD-10-CM | POA: Diagnosis not present

## 2020-01-12 DIAGNOSIS — H40009 Preglaucoma, unspecified, unspecified eye: Secondary | ICD-10-CM | POA: Diagnosis not present

## 2020-01-12 DIAGNOSIS — L089 Local infection of the skin and subcutaneous tissue, unspecified: Secondary | ICD-10-CM | POA: Diagnosis not present

## 2020-01-12 DIAGNOSIS — W19XXXD Unspecified fall, subsequent encounter: Secondary | ICD-10-CM | POA: Diagnosis not present

## 2020-01-12 DIAGNOSIS — I471 Supraventricular tachycardia: Secondary | ICD-10-CM | POA: Diagnosis not present

## 2020-01-12 DIAGNOSIS — F0390 Unspecified dementia without behavioral disturbance: Secondary | ICD-10-CM | POA: Diagnosis not present

## 2020-01-12 DIAGNOSIS — R2681 Unsteadiness on feet: Secondary | ICD-10-CM | POA: Diagnosis not present

## 2020-01-12 DIAGNOSIS — S91011A Laceration without foreign body, right ankle, initial encounter: Secondary | ICD-10-CM | POA: Diagnosis not present

## 2020-01-12 DIAGNOSIS — R0902 Hypoxemia: Secondary | ICD-10-CM | POA: Diagnosis not present

## 2020-01-12 DIAGNOSIS — Z7901 Long term (current) use of anticoagulants: Secondary | ICD-10-CM | POA: Diagnosis not present

## 2020-01-12 DIAGNOSIS — R55 Syncope and collapse: Secondary | ICD-10-CM | POA: Diagnosis not present

## 2020-01-12 DIAGNOSIS — R410 Disorientation, unspecified: Secondary | ICD-10-CM | POA: Diagnosis not present

## 2020-01-12 DIAGNOSIS — S81811D Laceration without foreign body, right lower leg, subsequent encounter: Secondary | ICD-10-CM | POA: Diagnosis not present

## 2020-01-12 DIAGNOSIS — H353231 Exudative age-related macular degeneration, bilateral, with active choroidal neovascularization: Secondary | ICD-10-CM | POA: Diagnosis not present

## 2020-01-12 DIAGNOSIS — U071 COVID-19: Secondary | ICD-10-CM | POA: Diagnosis not present

## 2020-01-12 DIAGNOSIS — E611 Iron deficiency: Secondary | ICD-10-CM | POA: Diagnosis not present

## 2020-01-12 DIAGNOSIS — R32 Unspecified urinary incontinence: Secondary | ICD-10-CM | POA: Diagnosis not present

## 2020-01-12 DIAGNOSIS — F339 Major depressive disorder, recurrent, unspecified: Secondary | ICD-10-CM | POA: Diagnosis not present

## 2020-01-12 DIAGNOSIS — D509 Iron deficiency anemia, unspecified: Secondary | ICD-10-CM | POA: Diagnosis not present

## 2020-01-12 DIAGNOSIS — R197 Diarrhea, unspecified: Secondary | ICD-10-CM | POA: Diagnosis not present

## 2020-01-12 DIAGNOSIS — R29818 Other symptoms and signs involving the nervous system: Secondary | ICD-10-CM | POA: Diagnosis not present

## 2020-01-12 DIAGNOSIS — I4892 Unspecified atrial flutter: Secondary | ICD-10-CM | POA: Diagnosis not present

## 2020-01-12 DIAGNOSIS — Z7401 Bed confinement status: Secondary | ICD-10-CM | POA: Diagnosis not present

## 2020-01-12 DIAGNOSIS — L89210 Pressure ulcer of right hip, unstageable: Secondary | ICD-10-CM | POA: Diagnosis not present

## 2020-01-12 DIAGNOSIS — R413 Other amnesia: Secondary | ICD-10-CM | POA: Diagnosis not present

## 2020-01-12 DIAGNOSIS — N951 Menopausal and female climacteric states: Secondary | ICD-10-CM | POA: Diagnosis not present

## 2020-01-12 DIAGNOSIS — N182 Chronic kidney disease, stage 2 (mild): Secondary | ICD-10-CM | POA: Diagnosis not present

## 2020-01-12 LAB — GLUCOSE, CAPILLARY: Glucose-Capillary: 89 mg/dL (ref 70–99)

## 2020-01-12 MED ORDER — COLLAGENASE 250 UNIT/GM EX OINT: TOPICAL_OINTMENT | Freq: Every day | CUTANEOUS | 0 refills | Status: DC

## 2020-01-12 MED ORDER — METOPROLOL TARTRATE 25 MG PO TABS
12.5000 mg | ORAL_TABLET | Freq: Two times a day (BID) | ORAL | 0 refills | Status: DC
Start: 2020-01-12 — End: 2021-08-02

## 2020-01-12 NOTE — Social Work (Addendum)
Clinical Social Worker facilitated patient discharge including contacting patient family and facility to confirm patient discharge plans.  Clinical information faxed to facility and family agreeable with plan.  CSW arranged ambulance transport via [facility] to [facility] RN to call [phone]  with report prior to discharge.  Clinical Social Worker will sign off for now as social work intervention is no longer needed. Please consult [place] again if new need arises.  Westley Hummer, MSW, LCSW Clinical Social Worker

## 2020-01-12 NOTE — TOC Progression Note (Signed)
Transition of Care [facility]) - Progression Note    Patient Details  Name: Bianca [name] MRN: [id number] Date of Birth: 1923-03-11  Transition of Care [facility]) CM/SW [place], [place] Phone Number: 01/12/2020, 11:46 AM  Clinical Narrative:    CSW spoke with [facility], they have a SNF bed for pt, they are asking for CSW to send information regarding 3 midnight Medicare waiver to [phone]. If pt ready she can d/c to [facility], [facility]. CSW has sent secure chat to Dr. Arby Barrette to see if her team feels pt stable for d/c, new COVID resulted negative.    Expected Discharge Plan: [place] Barriers to Discharge: Continued Medical Work up  Expected Discharge Plan and Services Expected Discharge Plan: [place] In-house Referral: Clinical Social Work Discharge Planning Services: CM Consult Post Acute Care Choice: [place] arrangements for the past 2 months: [place]   Readmission Risk Interventions Readmission Risk Prevention Plan 01/11/2020  Transportation Screening Complete  PCP or Specialist Appt within 5-7 Days Complete  Home Care Screening Complete  Medication Review (RN CM) Referral to Pharmacy  Some recent data might be hidden

## 2020-01-12 NOTE — Progress Notes (Signed)
Physical Therapy Treatment Patient Details Name: Bianca [name] MRN: [id number] DOB: 1922/12/01 Today's Date: 01/12/2020    History of Present Illness Pt is a 86 y/o female admitted after fall. Pt sustained laceration to R ankle. PMH includes dementia, a fib, CVA and CKD.     PT Comments    Pt supine on arrival, anxious and confused but agreeable to therapy session with good participation and tolerance for session. Pt performed bed mobility with min guard at most, sit<>stand from bed with minA to RW, and gait with min to maxA and RW, pt requiring increased assist with initial stand pivot transfer to [facility] due to significant fear of falling and posterior lean. Pt did better with second gait trial of lateral stepping and pre-gait standing exercises at EOB with heavy reassurance and education on safety/activity pacing. Pt continues to benefit from PT services to progress toward functional mobility goals. Pt could benefit from SNF level of care at current facility if available prior to return to ALF level of care.   Follow Up Recommendations  Home health PT;Supervision/Assistance - 24 hour;Other (comment) (return to ALF with increased daily therapies)     Equipment Recommendations  None recommended by PT;Other (comment) (defer to next facility)       Precautions / Restrictions Precautions Precautions: Fall Restrictions Weight Bearing Restrictions: No    Mobility  Bed Mobility Overal bed mobility: Needs Assistance Bed Mobility: Supine to Sit;Sit to Supine     Supine to sit: Min guard Sit to supine: Min guard   General bed mobility comments: min guard for safety and improved technique  Transfers Overall transfer level: Needs assistance Equipment used: Rolling walker (2 wheeled) Transfers: Sit to/from [facility] Sit to Stand: Min assist         General transfer comment: from bed height to RW needs cues for safety and BUE placement; increased cues needed for  steadying once standing 2/2 fear of falling; increased assist needed for stand pivot due to pt anxiety and needs assist to manage RW; posterior lean and fairly unsteady throughout  Ambulation/Gait Ambulation/Gait assistance: Max assist Gait Distance (Feet): 5 Feet Assistive device: Rolling walker (2 wheeled) Gait Pattern/deviations: Step-to pattern;Decreased step length - right;Decreased step length - left;Decreased stride length;Shuffle;Leaning posteriorly;Trunk flexed   Gait velocity interpretation: <1.8 ft/sec, indicate of risk for recurrent falls General Gait Details: pt very fearful of falling and inconsistent foot placement, some buckling but able to improve posture with verbal/tactile cues; needs heavy assist to manage RW      Balance Overall balance assessment: Needs assistance Sitting-balance support: Feet supported;No upper extremity supported Sitting balance-Leahy Scale: Good     Standing balance support: Bilateral upper extremity supported;During functional activity Standing balance-Leahy Scale: Poor Standing balance comment: Reliant on UE and external support, pt with posterior lean due to significant fear of falling and needs frequent reassurance/cueing for improved upright posture and activity pacing       Cognition Arousal/Alertness: Awake/alert Behavior During Therapy: Anxious;Impulsive Overall Cognitive Status: History of cognitive impairments - at baseline      General Comments: History of dementia at baseline. Difficulty orienting self and telling stories not related to current situation. Quite impulsive with mobility with poor attention- requires max mutlimodal cues to maintain safety      Exercises Total Joint Exercises Ankle Circles/Pumps: AROM;Seated;Both;15 reps Long Arc Quad: AROM;10 reps;Both;Strengthening;Seated Marching in Standing: AROM;Both;20 reps;Standing General Exercises - Lower Extremity Hip Flexion/Marching: AROM;Seated;Both;10 reps         Pertinent  Vitals/Pain Pain Assessment: Faces Pain Score: 0-No pain Faces Pain Scale: No hurt Pain Intervention(s): Monitored during session           PT Goals (current goals can now be found in the care plan section) Acute Rehab PT Goals Patient Stated Goal: to go back to ALF  PT Goal Formulation: With patient Time For Goal Achievement: 01/24/20 Potential to Achieve Goals: Good Progress towards PT goals: Progressing toward goals    Frequency    Min 3X/week      PT Plan Current plan remains appropriate       AM-PAC PT "6 Clicks" Mobility   Outcome Measure  Help needed turning from your back to your side while in a flat bed without using bedrails?: None Help needed moving from lying on your back to sitting on the side of a flat bed without using bedrails?: None Help needed moving to and from a bed to a chair (including a wheelchair)?: A Little Help needed standing up from a chair using your arms (e.g., wheelchair or bedside chair)?: A Little Help needed to walk in hospital room?: A Lot Help needed climbing 3-5 steps with a railing? : Total 6 Click Score: 17    End of Session Equipment Utilized During Treatment: Gait belt Activity Tolerance: Patient tolerated treatment well Patient left: in bed;with call bell/phone within reach;with nursing/sitter in room (RN informed to set bed alarm after replacing pt briefs/purewick; bed and lowest height and mats on each side of bed on floor) Nurse Communication: Mobility status PT Visit Diagnosis: Unsteadiness on feet (R26.81);Muscle weakness (generalized) (M62.81)     Time: 8372-9021 PT Time Calculation (min) (ACUTE ONLY): 42 min  Charges:  $Gait Training: 8-22 mins $Therapeutic Exercise: 8-22 mins $Therapeutic Activity: 8-22 mins                     Krystelle Prashad P., PTA Acute Rehabilitation Services Pager: [phone] Office: [place] 01/12/2020, 3:36 PM

## 2020-01-12 NOTE — Progress Notes (Signed)
Family Medicine Teaching Service Daily Progress Note Intern Pager: [phone]  Patient name: Bianca [name] Medical record number: [id number] Date of birth: 01-02-1923 Age: 86 y.o. Gender: female  Primary Care Provider: [name], [name], NP Consultants: Cardiology Code Status: DNR   Pt Overview and Major Events to Date:  10/2: admitted for suspected syncopal event, prolonged QTc >600, 594  Assessment and Plan: Bianca [name] is a 86 y.o. female presenting after a fall from seated positionwith concern for syncope. PMH is significant fordementia, SVT, transient bradycardia, A fib on Eliquis, hx CVA, emphysema, CKD. Medically stable for discharge.  Fall Suspected syncopal episode with injury to head and right ankle.  Findings of prolonged QT on admission. - holding amiodarone - tylenol prn  Right ankle laceration Wound repaired, stable.  Prolonged QTc Normal QTc seen on EKG 10/4  A Fib  Was on amiodarone, discontinued due to prolonged QTc.  Cardiology following, appreciate involvement.  Recommendation for rate control. - dc amiodarone - metoprolol tartrate 12.5 mg BID per cardiology - home apixaban - f/u w/ cardiology   Protein calorie malnutrition Moderate. BMI 18 - supplemental nutrition while admitted  FEN/GI: Dysphagia 3 PPx: Apixaban   Disposition: SNF when bed availabile   Subjective:  Patient sitting in bed this morning eating breakfast. She states her hip hurts but that its something that has been chronically hurting. Otherwise doing well and denies any other complaints  Objective: Temp:  [97.6 F (36.4 C)-98.3 F (36.8 C)] 98.2 F (36.8 C) (10/05 0529) Pulse Rate:  [59-66] 66 (10/05 0529) Resp:  [18] 18 (10/05 0529) BP: (112-115)/(58-60) 112/59 (10/05 0529) SpO2:  [92 %-94 %] 93 % (10/05 0529) Weight:  [42.4 kg] 42.4 kg (10/05 0328) Physical Exam: General: frail, NAD Cardiovascular: Irregularly irregular  Respiratory: CTAB. Normal WOB Abdomen: soft,  non tender Extremities: warm, dry. Ecchymoses on lower extremities bilaterally,  Pulses 2+ bilaterally    Laboratory: Recent Labs  Lab 01/09/20 1820 01/10/20 0448 01/11/20 0154  WBC 6.9 7.6 7.5  HGB 11.0* 10.9* 10.4*  HCT 34.4* 34.8* 32.9*  PLT 305 287 281   Recent Labs  Lab 01/09/20 1820 01/10/20 0448 01/11/20 0154  NA 138 142 142  K 3.3* 3.6 4.0  CL 107 110 109  CO2 22 24 28   BUN 20 14 15   CREATININE 0.81 0.69 0.84  CALCIUM 8.1* 8.2* 8.0*  GLUCOSE 101* 86 91    Imaging/Diagnostic Tests:  None  Shary Key, DO 01/12/2020, 7:31 AM PGY-1, [place] Intern pager: [phone], text pages welcome

## 2020-01-12 NOTE — Discharge Summary (Addendum)
[facility] Discharge Summary  Patient name: Bianca [name] Medical record number: [id number] Date of birth: January 21, 1923 Age: 86 y.o. Gender: female Date of Admission: 01/09/2020  Date of Discharge: 01/12/20 Admitting Physician: Bianca Foster, MD  Primary Care Provider: [name], [name], NP Consultants: Cardiology  Indication for Hospitalization: Fall with concern for syncope   Discharge Diagnoses/Problem List:  Problem List Items Addressed This Visit       Other   Prolonged QT interval   Syncope - Primary      Other Visit Diagnoses     Injury of head, initial encounter       Laceration of right lower extremity, initial encounter           Disposition: [facility]  Discharge Condition: Stable  Discharge Exam:   Temp:  [97.6 F (36.4 C)-98.3 F (36.8 C)] 98.2 F (36.8 C) (10/05 0529) Pulse Rate:  [59-66] 66 (10/05 0529) Resp:  [18] 18 (10/05 0529) BP: (112-115)/(58-60) 112/59 (10/05 0529) SpO2:  [92 %-94 %] 93 % (10/05 0529) Weight:  [42.4 kg] 42.4 kg (10/05 0328)   Physical Exam: General: frail, NAD Cardiovascular: Irregularly irregular  Respiratory: CTAB. Normal WOB Abdomen: soft, non tender Extremities: warm, dry. Ecchymoses on lower extremities bilaterally,  Pulses 2+ bilaterally    Brief Hospital Course:   [name] is a 86 y.o. female who presented after a fall from seated position with concern for syncope. PMH is significant for dementia, SVT, transient bradycardia, A fib on Eliquis, hx CVA, emphysema, CKD.    Fall: Patient presented after suspected syncopal episode with injury to her head and right ankle. CT head was negative for acute bleed, right ankle XR was negative for acute fracture. Her right ankle laceration was repaired in the ED and remained stable. She was given tylenol as needed for pain. PT/OT, and wound care worked with patient.  She remained at neurologic baseline while hospitalized    Prolonged QTc: EKG on presentation showed prolonged QTc of 632 that improved to 594 on repeat. Potassium was 3.3 in ED, and 2 doses of 72mEq K+ was given. Magnesium level was normal at 2.0. Amiodarone was held because of the Qtc prolongation. Patient was monitored on cardiac telemetry with no events occurring. QTc on discharge was 447.     A Fib : Patient was on amiodarone, discontinued due to prolonged QTc.  Cardiology was consulted and started patient on Metoprolol tartrate 12.5 mg twice a day. She was also continued on her home Apixaban. Patient remained hemodynamically stable.     Protein calorie malnutrition: Moderate. BMI 18. Patient was provided with supplemental nutrition while admitted.   Issues for Follow Up:  F/u with cardiology regarding atrial fibrillation Consider repeat EKG to check QTc F/u with PCP in 2-3 days  Remove ankle sutures in 10-14 days (placed on 10/2) Please follow wound care instructions below Continue medications listed below    Significant Procedures: None  Significant Labs and Imaging:  Recent Labs  Lab 01/09/20 1820 01/10/20 0448 01/11/20 0154  WBC 6.9 7.6 7.5  HGB 11.0* 10.9* 10.4*  HCT 34.4* 34.8* 32.9*  PLT 305 287 281   Recent Labs  Lab 01/09/20 1820 01/09/20 1820 01/09/20 2238 01/10/20 0448 01/11/20 0154  NA 138  --   --  142 142  K 3.3*   < >  --  3.6 4.0  CL 107  --   --  110 109  CO2 22  --   --  24 28  GLUCOSE 101*  --   --  86 91  BUN 20  --   --  14 15  CREATININE 0.81  --   --  0.69 0.84  CALCIUM 8.1*  --   --  8.2* 8.0*  MG  --   --  1.9 2.0  --    < > = values in this interval not displayed.     Results/Tests Pending at Time of Discharge: None  Discharge Medications:  Allergies as of 01/12/2020       Reactions   Azopt [brinzolamide] Other (See Comments)   UNK   Brimonidine Other (See Comments)   UNK   Lumigan [bimatoprost] Other (See Comments)   UNK        Medication List     STOP taking these medications     amiodarone 200 MG tablet Commonly known as: PACERONE       TAKE these medications    acetaminophen 325 MG tablet Commonly known as: TYLENOL Take 650 mg by mouth every 6 (six) hours as needed for mild pain or headache.   apixaban 2.5 MG Tabs tablet Commonly known as: ELIQUIS Take 1 tablet (2.5 mg total) by mouth 2 (two) times daily.   atorvastatin 20 MG tablet Commonly known as: LIPITOR Take 1 tablet (20 mg total) by mouth daily at 6 PM.   b complex vitamins tablet Take 1 tablet by mouth daily.   bismuth subsalicylate 536 UY/40HK suspension Commonly known as: PEPTO BISMOL Take 60 mLs by mouth daily as needed for indigestion or diarrhea or loose stools.   Calcium Carbonate-Vitamin D 600-400 MG-UNIT tablet Take 1 tablet by mouth daily.   CENTRUM SILVER PO Take 1 tablet by mouth daily. 7am-10am.   collagenase ointment Commonly known as: SANTYL Apply topically daily. Start taking on: January 13, 2020   diphenoxylate-atropine 2.5-0.025 MG tablet Commonly known as: LOMOTIL Take 1 tablet by mouth daily as needed for diarrhea or loose stools.   feeding supplement (PRO-STAT 64) Liqd Take 30 mLs by mouth daily.   Foot Care Products Misc by Does not apply route. Gel bunion cushion once a day on Wednesday   lactose free nutrition Liqd Take 237 mLs by mouth daily.   melatonin 3 MG Tabs tablet Take 3 mg by mouth at bedtime.   metoprolol tartrate 25 MG tablet Commonly known as: LOPRESSOR Take 0.5 tablets (12.5 mg total) by mouth 2 (two) times daily.   NON FORMULARY Moisturizing Cream to extremities with morning and evening care Every Shift   NON FORMULARY Regular Special Instructions: Regular Diet, Thin Liquids   omeprazole 20 MG capsule Commonly known as: PRILOSEC Take 20 mg by mouth daily.   sertraline 25 MG tablet Commonly known as: ZOLOFT Take 0.5 tablets (12.5 mg total) by mouth at bedtime.   timolol 0.5 % ophthalmic solution Commonly known as:  BETIMOL Place 1 drop into both eyes daily. 7am-10am.               Discharge Care Instructions  (From admission, onward)           Start     Ordered   01/12/20 0000  Discharge wound care:       Comments: Patient was seen by wound care nurse who recommended to apply Santyl to right hip wound in a nickel thick layer. Cover with a saline moistened gauze, then foam dressing.  Change daily.   01/12/20 1250  Discharge Instructions: Please refer to Patient Instructions section of EMR for full details.  Patient was counseled important signs and symptoms that should prompt return to medical care, changes in medications, dietary instructions, activity restrictions, and follow up appointments.   Follow-Up Appointments:  Contact information for follow-up providers     Tolia, Sunit, DO Follow up in 4 week(s).   Specialties: Cardiology, Radiology, Vascular Surgery Contact information: [place] [address] [phone]              Contact information for after-discharge care     Destination     [facility] .   Service: Skilled Nursing Contact information: [address]                     Benay Pike, MD 01/12/2020, 3:40 PM PGY-1, [facility]  Resident Addendum I have separately seen and examined the patient.  I have discussed the findings and exam with the resident and agree with the above note.  I helped develop the management plan that is described in the resident's note and I agree with the content.  Changes have been made in BLUE.    Addison Naegeli, MD PGY-2 [facility] residency program

## 2020-01-12 NOTE — TOC Transition Note (Signed)
Transition of Care [facility]) - CM/SW Discharge Note   Patient Details  Name: Bianca [name] MRN: [id number] Date of Birth: 26-May-1922  [facility] of [facility] CM/SW Contact:  Alexander Mt, LCSW Phone Number: 01/12/2020, 4:31 PM   Clinical Narrative:    All paperwork sent through hub, DNR signed on chart, no controlled medications noted. Number for report on chart and RN Roselyn Reef aware. MD cosigned discharge summary. [facility] called and picked pt up. Pt nephew Liliane Channel called and is aware as is admissions liaison [place].    Final next level of care: Skilled Nursing Facility Barriers to Discharge: Barriers Resolved   Patient Goals and CMS Choice Patient states their goals for this hospitalization and ongoing recovery are:: return to [facility]; poss short stay in SNF side CMS Medicare.gov Compare Post Acute Care list provided to:: Patient Represenative (must comment) (pt nephew Liliane Channel; pt is LTC resident at [facility]) Choice offered to / list presented to :  (pt nephew Liliane Channel)  Discharge Placement   Existing PASRR number confirmed : 01/11/20          Patient chooses bed at: [facility] Patient to be transferred to facility by: [facility] Name of family member notified: pt nephew Liliane Channel Patient and family notified of of transfer: 01/12/20  Discharge Plan and Services In-house Referral: Clinical Social Work Discharge Planning Services: [facility] Acute Care Choice: [place]            Readmission Risk Interventions Readmission Risk Prevention Plan 01/11/2020  Transportation Screening Complete  PCP or Specialist Appt within 5-7 Days Complete  Home Care Screening Complete  Medication Review (RN CM) Referral to Pharmacy  Some recent data might be hidden

## 2020-01-12 NOTE — Progress Notes (Deleted)
[facility] Discharge Summary  Patient name: Bianca [name] Medical record number: [id number] Date of birth: 1922-08-12 Age: 86 y.o. Gender: female Date of Admission: 01/09/2020  Date of Discharge: 01/12/20 Admitting Physician: Eulis Foster, MD  Primary Care Provider: [name], [name], NP Consultants: Cardiology   Indication for Hospitalization: Fall with concern for syncope   Discharge Diagnoses/Problem List:   Problem List Items Addressed This Visit      Other   Prolonged QT interval   Syncope - Primary    Other Visit Diagnoses    Injury of head, initial encounter       Laceration of right lower extremity, initial encounter         Disposition: [facility]  Discharge Condition: Stable   Discharge Exam:   Temp:  [97.6 F (36.4 C)-98.3 F (36.8 C)] 98.2 F (36.8 C) (10/05 0529) Pulse Rate:  [59-66] 66 (10/05 0529) Resp:  [18] 18 (10/05 0529) BP: (112-115)/(58-60) 112/59 (10/05 0529) SpO2:  [92 %-94 %] 93 % (10/05 0529) Weight:  [42.4 kg] 42.4 kg (10/05 0328)  Physical Exam: General: frail, NAD Cardiovascular: Irregularly irregular  Respiratory: CTAB. Normal WOB Abdomen: soft, non tender Extremities: warm, dry. Ecchymoses on lower extremities bilaterally,  Pulses 2+ bilaterally    Brief Hospital Course:   Bianca [name] is a 86 y.o. female who presented after a fall from seated positionwith concern for syncope. PMH is significant fordementia, SVT, transient bradycardia, A fib on Eliquis, hx CVA, emphysema, CKD.   Fall: Patient presented after suspected syncopal episode with injury to her head and right ankle. CT head was negative for acute bleed, right ankle XR was negative for acute fracture. Her right ankle laceration was repaired in the ED and remained stable. She was given tylenol as needed for pain. PT/OT, and wound care worked with patient.  She remained at neurologic baseline while hospitalized  Prolonged  QTc: EKG on presentation showed prolonged QTc of 632 that improved to 594 on repeat. Potassium was 3.3 in ED, and 2 doses of 27mEq K+ was given. Magnesium level was normal at 2.0. Amiodarone was held because of the Qtc prolongation. Patient was monitored on cardiac telemetry with no events occurring.   A Fib : Patient was on amiodarone, discontinued due to prolonged QTc.  Cardiology was consulted and started patient on Metoprolol tartrate 12.5 mg twice a day. She was also continued on her home Apixaban. Patient remained hemodynamically stable.    Protein calorie malnutrition: Moderate. BMI 18. Patient was provided with supplemental nutrition while admitted.   Issues for Follow Up:  1. F/u with cardiology regarding atrial fibrillation 2. F/u with PCP in 2-3 days  3. Remove ankle sutures in 10-14 days  4. Please follow wound care instructions below 5. Continue medications listed below   Significant Procedures: None   Significant Labs and Imaging:  Recent Labs  Lab 01/09/20 1820 01/10/20 0448 01/11/20 0154  WBC 6.9 7.6 7.5  HGB 11.0* 10.9* 10.4*  HCT 34.4* 34.8* 32.9*  PLT 305 287 281   Recent Labs  Lab 01/09/20 1820 01/09/20 1820 01/09/20 2238 01/10/20 0448 01/11/20 0154  NA 138  --   --  142 142  K 3.3*   < >  --  3.6 4.0  CL 107  --   --  110 109  CO2 22  --   --  24 28  GLUCOSE 101*  --   --  86 91  BUN 20  --   --  14 15  CREATININE 0.81  --   --  0.69 0.84  CALCIUM 8.1*  --   --  8.2* 8.0*  MG  --   --  1.9 2.0  --    < > = values in this interval not displayed.    Results/Tests Pending at Time of Discharge: None  Discharge Medications:  Allergies as of 01/12/2020      Reactions   Azopt [brinzolamide] Other (See Comments)   UNK   Brimonidine Other (See Comments)   UNK   Lumigan [bimatoprost] Other (See Comments)   UNK      Medication List    TAKE these medications   acetaminophen 325 MG tablet Commonly known as: TYLENOL Take 650 mg by mouth every 6  (six) hours as needed for mild pain or headache.   amiodarone 200 MG tablet Commonly known as: PACERONE Take 100 mg by mouth daily.   apixaban 2.5 MG Tabs tablet Commonly known as: ELIQUIS Take 1 tablet (2.5 mg total) by mouth 2 (two) times daily.   atorvastatin 20 MG tablet Commonly known as: LIPITOR Take 1 tablet (20 mg total) by mouth daily at 6 PM.   b complex vitamins tablet Take 1 tablet by mouth daily.   bismuth subsalicylate 505 LZ/76BH suspension Commonly known as: PEPTO BISMOL Take 60 mLs by mouth daily as needed for indigestion or diarrhea or loose stools.   Calcium Carbonate-Vitamin D 600-400 MG-UNIT tablet Take 1 tablet by mouth daily.   CENTRUM SILVER PO Take 1 tablet by mouth daily. 7am-10am.   collagenase ointment Commonly known as: SANTYL Apply topically daily. Start taking on: January 13, 2020   diphenoxylate-atropine 2.5-0.025 MG tablet Commonly known as: LOMOTIL Take 1 tablet by mouth daily as needed for diarrhea or loose stools.   feeding supplement (PRO-STAT 64) Liqd Take 30 mLs by mouth daily.   Foot Care Products Misc by Does not apply route. Gel bunion cushion once a day on Wednesday   lactose free nutrition Liqd Take 237 mLs by mouth daily.   melatonin 3 MG Tabs tablet Take 3 mg by mouth at bedtime.   metoprolol tartrate 25 MG tablet Commonly known as: LOPRESSOR Take 0.5 tablets (12.5 mg total) by mouth 2 (two) times daily.   NON FORMULARY Moisturizing Cream to extremities with morning and evening care Every Shift   NON FORMULARY Regular Special Instructions: Regular Diet, Thin Liquids   omeprazole 20 MG capsule Commonly known as: PRILOSEC Take 20 mg by mouth daily.   sertraline 25 MG tablet Commonly known as: ZOLOFT Take 0.5 tablets (12.5 mg total) by mouth at bedtime.   timolol 0.5 % ophthalmic solution Commonly known as: BETIMOL Place 1 drop into both eyes daily. 7am-10am.            Discharge Care Instructions   (From admission, onward)         Start     Ordered   01/12/20 0000  Discharge wound care:       Comments: Patient was seen by wound care nurse who recommended to apply Santyl to right hip wound in a nickel thick layer. Cover with a saline moistened gauze, then foam dressing.  Change daily.   01/12/20 1250          Discharge Instructions: Please refer to Patient Instructions section of EMR for full details.  Patient was counseled important signs and symptoms that should prompt return to medical care, changes in medications, dietary instructions, activity restrictions, and follow  up appointments.   Follow-Up Appointments:  Follow-up Information    Tolia, Sunit, DO Follow up in 4 week(s).   Specialties: Cardiology, Radiology, Vascular Surgery Contact information: [place] [address] [place], [place], DO 01/12/2020, 1:09 PM PGY-1, [place]

## 2020-01-12 NOTE — Discharge Instructions (Signed)
You were hospitalized at [facility] due to your fall.  We expect this is potentially from a syncopal episode. We are so glad you are feeling better.  For your hip wound, apply Santyl in a nickel thick layer. Cover with a saline moistened gauze, then foam dressing. Change daily.  Be sure to follow-up with your cardiology appointment.  Please also be sure to follow-up with your PCP at your earliest convenience.  Thank you for allowing [place] to take care of you.  Take care, [facility] family medicine team

## 2020-01-14 ENCOUNTER — Non-Acute Institutional Stay (SKILLED_NURSING_FACILITY): Payer: Medicare Other | Admitting: Internal Medicine

## 2020-01-14 ENCOUNTER — Encounter: Payer: Self-pay | Admitting: Internal Medicine

## 2020-01-14 DIAGNOSIS — R55 Syncope and collapse: Secondary | ICD-10-CM

## 2020-01-14 DIAGNOSIS — I639 Cerebral infarction, unspecified: Secondary | ICD-10-CM

## 2020-01-14 DIAGNOSIS — R9431 Abnormal electrocardiogram [ECG] [EKG]: Secondary | ICD-10-CM

## 2020-01-14 DIAGNOSIS — I471 Supraventricular tachycardia: Secondary | ICD-10-CM | POA: Diagnosis not present

## 2020-01-14 NOTE — Progress Notes (Signed)
Provider:  Veleta Miners MD Location:    [facility] Room Number: 36 Place of Service:  SNF (31)  PCP: [name], [name], NP Patient Care Team: [name], [name], NP as PCP - General (Internal Medicine)  Extended Emergency Contact Information Primary Emergency Contact: [name] Mobile Phone: [phone] Relation: Friend Secondary Emergency Contact: [name] Mobile Phone: [phone] Relation: Nephew  Code Status: DNR Goals of Care: Advanced Directive information Advanced Directives 01/14/2020  Does Patient Have a Medical Advance Directive? Yes  Type of Advance Directive Living will;Out of facility DNR (pink MOST or yellow form);Healthcare Power of Attorney  Does patient want to make changes to medical advance directive? No - Patient declined  Copy of [place] in Chart? Yes - validated most recent copy scanned in chart (See row information)  Pre-existing out of facility DNR order (yellow form or pink MOST form) Yellow form placed in chart (order not valid for inpatient use)      Chief Complaint  Patient presents with  . New Admit To SNF    Admission to SNF    HPI: Patient is a 86 y.o. 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 Discharged on Amiodarone and Eliquis  She does not remember the fall but per nursing  notes was Found on the floor of the her Bathroom with Bleeding from her Ankle and had hit her head .CT Scan of head was negative for any Acute process. Ankle has lacerations needing  Repair  Her EKG was positive for Prolong QT interval Amiodarone was stopped and she is back in Facility for Rehab and Possible long term care Her only complain today was confusion and feeling weak and Unstable No Dizziness or Chest pain    Past Medical History:  Diagnosis Date  . COVID-19   . 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 No pertinent surgical history.  reports that she has never smoked. She has never used smokeless tobacco. She reports that she does not drink alcohol and does not use drugs. Social History   Socioeconomic History  . Marital status: Widowed    Spouse name: Not on file  . Number of children: Not on file  . Years of education: Not on file  . Highest education level: Not on file  Occupational History  . Not on file  Tobacco Use  . Smoking status: Never Smoker  . Smokeless tobacco: Never Used  Vaping Use  . Vaping Use: Never used  Substance and Sexual Activity  . Alcohol use: Never  . Drug use: Never  . Sexual activity: Not on file  Other Topics Concern  . Not on file  Social History Narrative  . Not on file   Social Determinants of Health   Financial Resource Strain:   . Difficulty of Paying Living Expenses: Not on file  Food Insecurity:   . Worried About Charity fundraiser in the Last Year: Not on file  .  Ran Out of Food in the Last Year: Not on file  Transportation Needs:   . Lack of Transportation (Medical): Not on file  . Lack of Transportation (Non-Medical): Not on file  Physical Activity:   . Days of Exercise per Week: Not on file  . Minutes of Exercise per Session: Not on file  Stress:   . Feeling of Stress : Not on file  Social Connections:   . Frequency of Communication with Friends and Family: Not on file  . Frequency of Social Gatherings with Friends and Family: Not on file  . Attends Religious Services: Not on file  . Active Member of Clubs or  Organizations: Not on file  . Attends Archivist Meetings: Not on file  . Marital Status: Not on file  Intimate Partner Violence:   . Fear of Current or Ex-Partner: Not on file  . Emotionally Abused: Not on file  . Physically Abused: Not on file  . Sexually Abused: Not on file    Functional Status Survey:    History reviewed. No pertinent family history.  Health Maintenance  Topic Date Due  . TETANUS/TDAP  Never done  . PNA vac Low Risk Adult (1 of 2 - PCV13) Never done  . COVID-19 Vaccine (2 - Moderna 2-dose series) 07/04/2019  . INFLUENZA VACCINE  11/08/2019  . DEXA SCAN  Completed    Allergies  Allergen Reactions  . Azopt [Brinzolamide] Other (See Comments)    UNK  . Brimonidine Other (See Comments)    UNK  . Lumigan [Bimatoprost] Other (See Comments)    UNK    Allergies as of 01/14/2020      Reactions   Azopt [brinzolamide] Other (See Comments)   UNK   Brimonidine Other (See Comments)   UNK   Lumigan [bimatoprost] Other (See Comments)   UNK      Medication List       Accurate as of January 14, 2020  8:58 AM. If you have any questions, ask your nurse or doctor.        acetaminophen 325 MG tablet Commonly known as: TYLENOL Take 650 mg by mouth every 6 (six) hours as needed for mild pain or headache.   apixaban 2.5 MG Tabs tablet Commonly known as: ELIQUIS Take 1 tablet (2.5 mg total) by mouth 2 (two) times daily.   atorvastatin 20 MG tablet Commonly known as: LIPITOR Take 1 tablet (20 mg total) by mouth daily at 6 PM.   b complex vitamins tablet Take 1 tablet by mouth daily.   bismuth subsalicylate 027 OZ/36UY suspension Commonly known as: PEPTO BISMOL Take 60 mLs by mouth daily as needed for indigestion or diarrhea or loose stools.   Calcium Carbonate-Vitamin D 600-400 MG-UNIT tablet Take 1 tablet by mouth daily.   CENTRUM SILVER PO Take 1 tablet by mouth daily. 7am-10am.   collagenase ointment Commonly known as: SANTYL Apply  topically daily.   diphenoxylate-atropine 2.5-0.025 MG tablet Commonly known as: LOMOTIL Take 1 tablet by mouth daily as needed for diarrhea or loose stools.   feeding supplement (PRO-STAT 64) Liqd Take 30 mLs by mouth daily.   Foot Care Products Misc by Does not apply route. Gel bunion cushion once a day on Wednesday   lactose free nutrition Liqd Take 237 mLs by mouth daily.   melatonin 3 MG Tabs tablet Take 3 mg by mouth at bedtime.   metoprolol tartrate 25 MG tablet Commonly known as: LOPRESSOR Take 0.5 tablets (12.5 mg total) by  mouth 2 (two) times daily.   NON FORMULARY Moisturizing Cream to extremities with morning and evening care Every Shift   NON FORMULARY Regular Special Instructions: Regular Diet, Thin Liquids   omeprazole 20 MG capsule Commonly known as: PRILOSEC Take 20 mg by mouth daily.   sertraline 25 MG tablet Commonly known as: ZOLOFT Take 0.5 tablets (12.5 mg total) by mouth at bedtime.   timolol 0.5 % ophthalmic solution Commonly known as: BETIMOL Place 1 drop into both eyes daily. 7am-10am.   zinc oxide 20 % ointment Apply 1 application topically as needed for irritation.       Review of Systems  Constitutional: Positive for activity change.  HENT: Negative.   Respiratory: Negative.   Cardiovascular: Negative.   Gastrointestinal: Negative.   Genitourinary: Negative.   Musculoskeletal: Positive for gait problem.  Skin: Positive for wound.  Neurological: Positive for weakness. Negative for dizziness and light-headedness.  Psychiatric/Behavioral: Positive for confusion and dysphoric mood.    Vitals:   01/14/20 0845  BP: 125/67  Pulse: 60  Resp: 20  Temp: (!) 96.7 F (35.9 C)  SpO2: 91%  Weight: 91 lb 3.2 oz (41.4 kg)  Height: 5' (1.524 m)   Body mass index is 17.81 kg/m. Physical Exam Vitals reviewed.  HENT:     Head: Normocephalic.     Nose: Nose normal.     Mouth/Throat:     Mouth: Mucous membranes are moist.      Pharynx: Oropharynx is clear.  Eyes:     Pupils: Pupils are equal, round, and reactive to light.  Cardiovascular:     Rate and Rhythm: Normal rate and regular rhythm.     Pulses: Normal pulses.  Pulmonary:     Effort: Pulmonary effort is normal.     Breath sounds: Normal breath sounds.  Abdominal:     General: Abdomen is flat. Bowel sounds are normal.     Palpations: Abdomen is soft.  Musculoskeletal:        General: No swelling.     Cervical back: Neck supple.  Skin:    General: Skin is warm.  Neurological:     General: No focal deficit present.     Mental Status: She is alert.     Comments: Not Oriented.  Psychiatric:     Comments: Dysphoric Mood. Confused why she is here. Worried that she cannot get up by herself     Labs reviewed: Basic Metabolic Panel: Recent Labs    11/03/19 0454 11/04/19 0535 01/09/20 1820 01/09/20 2238 01/10/20 0448 01/11/20 0154  NA 143   < > 138  --  142 142  K 4.1   < > 3.3*  --  3.6 4.0  CL 103   < > 107  --  110 109  CO2 22   < > 22  --  24 28  GLUCOSE 76   < > 101*  --  86 91  BUN 32*   < > 20  --  14 15  CREATININE 1.02*   < > 0.81  --  0.69 0.84  CALCIUM 8.5*   < > 8.1*  --  8.2* 8.0*  MG 2.3  --   --  1.9 2.0  --    < > = values in this interval not displayed.   Liver Function Tests: Recent Labs    11/02/19 1045 11/02/19 1045 11/03/19 0454 11/06/19 0544 11/19/19 0000  AST 34   < > 32 34 24  ALT 19   < >  18 18 18   ALKPHOS 64   < > 57 45 64  BILITOT 0.8  --  0.9 0.3  --   PROT 6.9  --  6.1* 5.1*  --   ALBUMIN 4.0   < > 3.5 2.9* 3.7   < > = values in this interval not displayed.   No results for input(s): LIPASE, AMYLASE in the last 8760 hours. Recent Labs    11/02/19 1045  AMMONIA 11   CBC: Recent Labs    10/26/19 0000 11/02/19 1045 01/09/20 1820 01/10/20 0448 01/11/20 0154  WBC 7.8   < > 6.9 7.6 7.5  NEUTROABS 5,936  --  5.1  --  5.6  HGB 13.9   < > 11.0* 10.9* 10.4*  HCT 42   < > 34.4* 34.8* 32.9*  MCV   --    < > 99.7 99.7 99.7  PLT 251   < > 305 287 281   < > = values in this interval not displayed.   Cardiac Enzymes: No results for input(s): CKTOTAL, CKMB, CKMBINDEX, TROPONINI in the last 8760 hours. BNP: Invalid input(s): POCBNP Lab Results  Component Value Date   HGBA1C 5.9 (H) 11/04/2019   Lab Results  Component Value Date   TSH 4.060 01/11/2020   Lab Results  Component Value Date   VITAMINB12 1,009 (H) 11/03/2019   No results found for: FOLATE No results found for: IRON, TIBC, FERRITIN  Imaging and Procedures obtained prior to SNF admission: DG Ankle Complete Right  Result Date: 01/09/2020 CLINICAL DATA:  Right leg laceration. EXAM: RIGHT ANKLE - COMPLETE 3+ VIEW COMPARISON:  None. FINDINGS: There is no evidence of fracture, dislocation, or joint effusion. There is no evidence of arthropathy or other focal bone abnormality. A 7.7 cm superficial soft tissue defect is seen adjacent to the medial aspect of the distal right tibia. IMPRESSION: Superficial soft tissue defect adjacent to the medial aspect of the distal right tibia without an acute osseous abnormality. Electronically Signed   By: Virgina Norfolk M.D.   On: 01/09/2020 19:15   CT Head Wo Contrast  Result Date: 01/09/2020 CLINICAL DATA:  Mechanical fall from seated position EXAM: CT HEAD WITHOUT CONTRAST TECHNIQUE: Contiguous axial images were obtained from the base of the skull through the vertex without intravenous contrast. COMPARISON:  November 02, 2019 FINDINGS: Brain: No evidence of acute territorial infarction, hemorrhage, hydrocephalus,extra-axial collection or mass lesion/mass effect. There is dilatation the ventricles and sulci consistent with age-related atrophy. Low-attenuation changes in the deep white matter consistent with small vessel ischemia. Vascular: No hyperdense vessel or unexpected calcification. Skull: The skull is intact. No fracture or focal lesion identified. Sinuses/Orbits: The visualized paranasal  sinuses and mastoid air cells are clear. The orbits and globes intact. Other: None IMPRESSION: No acute intracranial abnormality. Findings consistent with age related atrophy and chronic small vessel ischemia Electronically Signed   By: Prudencio Pair M.D.   On: 01/09/2020 19:07    Assessment/Plan Paroxysmal SVT (supraventricular tachycardia) (HCC) Did not tolerate Amiodarone due to Prolong QT  Was taken off in the hospital Started on beta Blocker CVA  On Eliquis and Lipitor LDL72 Stays high risk of falls Prolonged Q-T interval on ECG/ Syncope Taken off Amiodarone Now on Beta Blcoker Repeat EKG Stays Fall Risk Working with therapy Depression  On Zoloft Skin tear on Right Ankle Will need stiches out in 10 days GERD On Omeprazole Chronic Diarrhea On Lomotil PRN Vascular Dementia Noticed Cognitive changes since her stroke few months  ago Continue on Eliquis and statin Anemia Will Repeat CBC And CMP to follow Potassium    Family/ staff Communication:   Labs/tests ordered: CBC And CMP in 1 week

## 2020-01-21 DIAGNOSIS — E611 Iron deficiency: Secondary | ICD-10-CM | POA: Diagnosis not present

## 2020-01-21 DIAGNOSIS — R6889 Other general symptoms and signs: Secondary | ICD-10-CM | POA: Diagnosis not present

## 2020-01-21 DIAGNOSIS — R7989 Other specified abnormal findings of blood chemistry: Secondary | ICD-10-CM | POA: Diagnosis not present

## 2020-01-21 LAB — CBC AND DIFFERENTIAL
HCT: 33 — AB (ref 36–46)
Hemoglobin: 10.7 — AB (ref 12.0–16.0)
Platelets: 261 (ref 150–399)
WBC: 6

## 2020-01-21 LAB — IRON,TIBC AND FERRITIN PANEL
%SAT: 16
Ferritin: 63
Iron: 44
TIBC: 273

## 2020-01-21 LAB — COMPREHENSIVE METABOLIC PANEL
Albumin: 3.1 — AB (ref 3.5–5.0)
Calcium: 8.1 — AB (ref 8.7–10.7)
Globulin: 1.7

## 2020-01-21 LAB — HEPATIC FUNCTION PANEL
ALT: 19 (ref 7–35)
AST: 22 (ref 13–35)
Alkaline Phosphatase: 73 (ref 25–125)
Bilirubin, Total: 0.6

## 2020-01-21 LAB — BASIC METABOLIC PANEL
BUN: 17 (ref 4–21)
CO2: 26 — AB (ref 13–22)
Chloride: 109 — AB (ref 99–108)
Creatinine: 0.8 (ref 0.5–1.1)
Glucose: 81
Potassium: 4.2 (ref 3.4–5.3)
Sodium: 143 (ref 137–147)

## 2020-01-21 LAB — VITAMIN B12: Vitamin B-12: 695

## 2020-01-21 LAB — CBC: RBC: 3.25 — AB (ref 3.87–5.11)

## 2020-01-22 ENCOUNTER — Encounter: Payer: Self-pay | Admitting: Nurse Practitioner

## 2020-01-22 ENCOUNTER — Non-Acute Institutional Stay (SKILLED_NURSING_FACILITY): Payer: Medicare Other | Admitting: Nurse Practitioner

## 2020-01-22 DIAGNOSIS — F339 Major depressive disorder, recurrent, unspecified: Secondary | ICD-10-CM

## 2020-01-22 DIAGNOSIS — T148XXA Other injury of unspecified body region, initial encounter: Secondary | ICD-10-CM

## 2020-01-22 DIAGNOSIS — R413 Other amnesia: Secondary | ICD-10-CM | POA: Diagnosis not present

## 2020-01-22 DIAGNOSIS — K219 Gastro-esophageal reflux disease without esophagitis: Secondary | ICD-10-CM

## 2020-01-22 DIAGNOSIS — D509 Iron deficiency anemia, unspecified: Secondary | ICD-10-CM | POA: Diagnosis not present

## 2020-01-22 DIAGNOSIS — R000 Tachycardia, unspecified: Secondary | ICD-10-CM | POA: Diagnosis not present

## 2020-01-22 DIAGNOSIS — I639 Cerebral infarction, unspecified: Secondary | ICD-10-CM

## 2020-01-22 DIAGNOSIS — L089 Local infection of the skin and subcutaneous tissue, unspecified: Secondary | ICD-10-CM | POA: Diagnosis not present

## 2020-01-22 DIAGNOSIS — R609 Edema, unspecified: Secondary | ICD-10-CM

## 2020-01-22 NOTE — Assessment & Plan Note (Signed)
Vascular dementia since CVA, no behavioral issues.

## 2020-01-22 NOTE — Progress Notes (Signed)
Location:   [facility] Room Number: 75 Place of Service:  SNF (31) Provider: Lennie Odor Fallon Howerter NP  Rubylee Zamarripa X, NP  Patient Care Team: Lila Lufkin X, NP as PCP - General (Internal Medicine)  Extended Emergency Contact Information Primary Emergency Contact: [name] Mobile Phone: [phone] Relation: Friend Secondary Emergency Contact: [name] Mobile Phone: [phone] Relation: Nephew  Code Status:  DNR Goals of care: Advanced Directive information Advanced Directives 01/14/2020  Does Patient Have a Medical Advance Directive? Yes  Type of Advance Directive Living will;Out of facility DNR (pink MOST or yellow form);Healthcare Power of Attorney  Does patient want to make changes to medical advance directive? No - Patient declined  Copy of [place] in Chart? Yes - validated most recent copy scanned in chart (See row information)  Pre-existing out of facility DNR order (yellow form or pink MOST form) Yellow form placed in chart (order not valid for inpatient use)     Chief Complaint  Patient presents with  . Acute Visit    Right lower leg wound    HPI:  [name] seen today for an acute visit for evaluate the right lower leg traumatic wound. The middle of the sutured wound is dehisced with the 1/3 of both ends healed/well approximated. Painful when touched, noted redness heat in the area.  Hospitalized 01/09/20-01/12/20 for episode of syncope, fall due to prolong QT interval, stopped Amiodarone   Hospital stay 11/02/19-11/11/19 for acute CVA (MRI cevebral small acute infarcts in the posterior left hemisphere in the left PCA and MCA watershed area. No significant carotid stenosis, Echo EF 88-87%), embolic etiology Edema, trace off Torsemide.  PSVT off Amiodarone, started Metoprolol, continued Eliquis.  GERD, takes Omeprazole.  Depression, takes Sertraline.   Vascular dementia since CVA,  no behavioral issues.   Anemia, 01/21/20 Iron 44, ferritin 63, Vit B12 695, Hgb 10.7  Past Medical History:  Diagnosis Date  . COVID-19   . 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 No pertinent surgical history.  Allergies  Allergen Reactions  . Azopt [Brinzolamide] Other (See Comments)    UNK  . Brimonidine Other (See Comments)    UNK  . Lumigan [Bimatoprost] Other (See Comments)    UNK    Allergies as of 01/22/2020      Reactions   Azopt [brinzolamide] Other (See Comments)   UNK   Brimonidine Other (See Comments)   UNK   Lumigan [bimatoprost] Other (See Comments)   UNK      Medication List       Accurate as of January 22, 2020 11:59 PM. If you have any questions, ask your nurse or doctor.        acetaminophen 325 MG tablet Commonly known as: TYLENOL Take 650 mg by mouth every 6 (six) hours as needed for mild pain or headache.   apixaban 2.5 MG Tabs tablet Commonly known as: ELIQUIS Take 1 tablet (2.5 mg total) by mouth 2 (two) times daily.   atorvastatin 20 MG tablet Commonly known as: LIPITOR Take 1 tablet (20 mg total) by mouth daily at 6 PM.   b complex vitamins tablet Take 1 tablet by mouth daily.   bismuth subsalicylate 579 JK/82SU suspension Commonly known as: PEPTO BISMOL Take 60 mLs by mouth daily as needed for indigestion or diarrhea or loose stools.   Calcium Carbonate-Vitamin D 600-400 MG-UNIT tablet Take 1 tablet by  mouth daily.   CENTRUM SILVER PO Take 1 tablet by mouth daily. 7am-10am.   collagenase ointment Commonly known as: SANTYL Apply topically daily.   diphenoxylate-atropine 2.5-0.025 MG tablet Commonly known as: LOMOTIL Take 1 tablet by mouth daily as needed for diarrhea or loose stools.   feeding supplement (PRO-STAT 64) Liqd Take 30 mLs by mouth daily.   Foot Care Products Misc by Does not apply route. Gel bunion cushion once a day on  Wednesday   lactose free nutrition Liqd Take 237 mLs by mouth daily.   melatonin 3 MG Tabs tablet Take 3 mg by mouth at bedtime.   metoprolol tartrate 25 MG tablet Commonly known as: LOPRESSOR Take 0.5 tablets (12.5 mg total) by mouth 2 (two) times daily.   NON FORMULARY Moisturizing Cream to extremities with morning and evening care Every Shift   NON FORMULARY Regular Special Instructions: Regular Diet, Thin Liquids   omeprazole 20 MG capsule Commonly known as: PRILOSEC Take 20 mg by mouth daily.   sertraline 25 MG tablet Commonly known as: ZOLOFT Take 0.5 tablets (12.5 mg total) by mouth at bedtime.   timolol 0.5 % ophthalmic solution Commonly known as: BETIMOL Place 1 drop into both eyes daily. 7am-10am.   zinc oxide 20 % ointment Apply 1 application topically as needed for irritation.       Review of Systems  Constitutional: Negative for fatigue and fever.  HENT: Positive for hearing loss and trouble swallowing. Negative for congestion and voice change.   Eyes: Negative for visual disturbance.  Respiratory: Negative for cough and shortness of breath.   Cardiovascular: Negative for palpitations and leg swelling.       RLE  Gastrointestinal: Negative for abdominal pain and constipation.  Genitourinary: Positive for frequency. Negative for difficulty urinating, dysuria and urgency.  Musculoskeletal: Positive for gait problem.  Skin: Positive for wound.  Neurological: Negative for dizziness, speech difficulty and weakness.  Psychiatric/Behavioral: Positive for confusion. Negative for behavioral problems and sleep disturbance.    Immunization History  Administered Date(s) Administered  . Influenza, High Dose Seasonal PF 11/08/2018  . Influenza-Unspecified 11/07/2013, 01/14/2020  . Moderna SARS-COVID-2 Vaccination 06/06/2019   Pertinent  Health Maintenance Due  Topic Date Due  . PNA vac Low Risk Adult (1 of 2 - PCV13) Never done  . INFLUENZA VACCINE   Completed  . DEXA SCAN  Completed   Fall Risk  05/19/2019 05/14/2019  Falls in the past year? 0 0  Number falls in past yr: 0 0  Injury with Fall? 0 -   Functional Status Survey:    Vitals:   01/22/20 1430  BP: 116/72  Pulse: (!) 109  Resp: 16  Temp: 97.9 F (36.6 C)  SpO2: 94%  Weight: 91 lb 12.8 oz (41.6 kg)  Height: 5' (1.524 m)   Body mass index is 17.93 kg/m. Physical Exam Vitals and nursing note reviewed.  Constitutional:      Appearance: Normal appearance.  HENT:     Head: Normocephalic and atraumatic.     Mouth/Throat:     Mouth: Mucous membranes are moist.  Eyes:     Extraocular Movements: Extraocular movements intact.     Conjunctiva/sclera: Conjunctivae normal.     Pupils: Pupils are equal, round, and reactive to light.  Cardiovascular:     Rate and Rhythm: Normal rate and regular rhythm.     Heart sounds: No murmur heard.   Pulmonary:     Breath sounds: No wheezing or rales.  Abdominal:  General: Bowel sounds are normal. There is no distension.     Palpations: Abdomen is soft.     Tenderness: There is no abdominal tenderness.  Musculoskeletal:     Cervical back: Normal range of motion and neck supple.     Right lower leg: No edema.     Left lower leg: No edema.  Skin:    General: Skin is warm and dry.     Comments:  the right lower leg traumatic wound. The middle of the sutured wound is dehisced with the 1/3 of both ends healed/well approximated. Painful when touched, noted redness heat in the area.  Neurological:     General: No focal deficit present.     Mental Status: She is alert and oriented to person, place, and time. Mental status is at baseline.     Motor: No weakness.     Coordination: Coordination normal.     Gait: Gait abnormal.  Psychiatric:        Mood and Affect: Mood normal.        Behavior: Behavior normal.        Thought Content: Thought content normal.     Labs reviewed: Recent Labs    11/03/19 0454 11/04/19 0535  01/09/20 1820 01/09/20 2238 01/10/20 0448 01/11/20 0154  NA 143   < > 138  --  142 142  K 4.1   < > 3.3*  --  3.6 4.0  CL 103   < > 107  --  110 109  CO2 22   < > 22  --  24 28  GLUCOSE 76   < > 101*  --  86 91  BUN 32*   < > 20  --  14 15  CREATININE 1.02*   < > 0.81  --  0.69 0.84  CALCIUM 8.5*   < > 8.1*  --  8.2* 8.0*  MG 2.3  --   --  1.9 2.0  --    < > = values in this interval not displayed.   Recent Labs    11/02/19 1045 11/02/19 1045 11/03/19 0454 11/06/19 0544 11/19/19 0000  AST 34   < > 32 34 24  ALT 19   < > '18 18 18  ' ALKPHOS 64   < > 57 45 64  BILITOT 0.8  --  0.9 0.3  --   PROT 6.9  --  6.1* 5.1*  --   ALBUMIN 4.0   < > 3.5 2.9* 3.7   < > = values in this interval not displayed.   Recent Labs    10/26/19 0000 11/02/19 1045 01/09/20 1820 01/10/20 0448 01/11/20 0154  WBC 7.8   < > 6.9 7.6 7.5  NEUTROABS 5,936  --  5.1  --  5.6  HGB 13.9   < > 11.0* 10.9* 10.4*  HCT 42   < > 34.4* 34.8* 32.9*  MCV  --    < > 99.7 99.7 99.7  PLT 251   < > 305 287 281   < > = values in this interval not displayed.   Lab Results  Component Value Date   TSH 4.060 01/11/2020   Lab Results  Component Value Date   HGBA1C 5.9 (H) 11/04/2019   Lab Results  Component Value Date   CHOL 138 11/06/2019   HDL 49 11/06/2019   LDLCALC 72 11/06/2019   TRIG 86 11/06/2019   CHOLHDL 2.8 11/06/2019    Significant Diagnostic Results in last 30  days:  DG Ankle Complete Right  Result Date: 01/09/2020 CLINICAL DATA:  Right leg laceration. EXAM: RIGHT ANKLE - COMPLETE 3+ VIEW COMPARISON:  None. FINDINGS: There is no evidence of fracture, dislocation, or joint effusion. There is no evidence of arthropathy or other focal bone abnormality. A 7.7 cm superficial soft tissue defect is seen adjacent to the medial aspect of the distal right tibia. IMPRESSION: Superficial soft tissue defect adjacent to the medial aspect of the distal right tibia without an acute osseous abnormality.  Electronically Signed   By: Virgina Norfolk M.D.   On: 01/09/2020 19:15   CT Head Wo Contrast  Result Date: 01/09/2020 CLINICAL DATA:  Mechanical fall from seated position EXAM: CT HEAD WITHOUT CONTRAST TECHNIQUE: Contiguous axial images were obtained from the base of the skull through the vertex without intravenous contrast. COMPARISON:  November 02, 2019 FINDINGS: Brain: No evidence of acute territorial infarction, hemorrhage, hydrocephalus,extra-axial collection or mass lesion/mass effect. There is dilatation the ventricles and sulci consistent with age-related atrophy. Low-attenuation changes in the deep white matter consistent with small vessel ischemia. Vascular: No hyperdense vessel or unexpected calcification. Skull: The skull is intact. No fracture or focal lesion identified. Sinuses/Orbits: The visualized paranasal sinuses and mastoid air cells are clear. The orbits and globes intact. Other: None IMPRESSION: No acute intracranial abnormality. Findings consistent with age related atrophy and chronic small vessel ischemia Electronically Signed   By: Prudencio Pair M.D.   On: 01/09/2020 19:07    Assessment/Plan Infected wound  The anterior  right lower leg traumatic wound. The middle of the sutured wound is dehisced with the 1/3 of both ends healed/well approximated. Painful when touched, noted redness heat in the area. Will remove sutures 2 from each end, will treat with Doxy bid x 7 days. Observe.   GERD (gastroesophageal reflux disease) Stable, continue Omeprazole.   Memory deficit Vascular dementia since CVA, no behavioral issues.   Anemia, iron deficiency Anemia, 01/21/20 Iron 44, ferritin 63, Vit B12 695, Hgb 10.7. may consider Iron supplement if no better.    Depression, recurrent ([place]) Her mood is stable, continue Sertraline.   Tachycardia PSVT off Amiodarone, started Metoprolol, continued Eliquis. HR 62 bpm upon my examination.    Edema Trace edema BLE R>L  Stroke  [facility]) Hospital stay 11/02/19-11/11/19 for acute CVA (MRI cevebral small acute infarcts in the posterior left hemisphere in the left PCA and MCA watershed area. No significant carotid stenosis, Echo EF 10-30%), embolic etiology    Family/ staff Communication: plan of care reviewed with the patient and charge nurse.   Labs/tests ordered:  CBC/diff, anemia panel, CMP/eGFR done 01/21/20  Time spend 35 minutes.

## 2020-01-22 NOTE — Assessment & Plan Note (Signed)
Anemia, 01/21/20 Iron 44, ferritin 63, Vit B12 695, Hgb 10.7. may consider Iron supplement if no better.

## 2020-01-22 NOTE — Assessment & Plan Note (Signed)
Hospital stay 11/02/19-11/11/19 for acute CVA (MRI cevebral small acute infarcts in the posterior left hemisphere in the left PCA and MCA watershed area. No significant carotid stenosis, Echo EF 33-91%), embolic etiology

## 2020-01-22 NOTE — Assessment & Plan Note (Signed)
The anterior  right lower leg traumatic wound. The middle of the sutured wound is dehisced with the 1/3 of both ends healed/well approximated. Painful when touched, noted redness heat in the area. Will remove sutures 2 from each end, will treat with Doxy bid x 7 days. Observe.

## 2020-01-22 NOTE — Assessment & Plan Note (Signed)
Trace edema BLE R>L

## 2020-01-22 NOTE — Assessment & Plan Note (Addendum)
PSVT off Amiodarone, started Metoprolol, continued Eliquis. HR 62 bpm upon my examination.

## 2020-01-22 NOTE — Assessment & Plan Note (Signed)
Her mood is stable, continue Sertraline.  

## 2020-01-22 NOTE — Assessment & Plan Note (Signed)
Stable, continue Omeprazole.  

## 2020-01-25 ENCOUNTER — Encounter: Payer: Self-pay | Admitting: Nurse Practitioner

## 2020-02-05 ENCOUNTER — Other Ambulatory Visit: Payer: Self-pay

## 2020-02-05 ENCOUNTER — Ambulatory Visit: Payer: Medicare Other | Admitting: Cardiology

## 2020-02-05 ENCOUNTER — Encounter: Payer: Self-pay | Admitting: Cardiology

## 2020-02-05 VITALS — BP 130/44 | HR 56 | Resp 10 | Ht 60.0 in | Wt 91.0 lb

## 2020-02-05 DIAGNOSIS — Z7901 Long term (current) use of anticoagulants: Secondary | ICD-10-CM

## 2020-02-05 DIAGNOSIS — I4892 Unspecified atrial flutter: Secondary | ICD-10-CM | POA: Diagnosis not present

## 2020-02-05 DIAGNOSIS — E782 Mixed hyperlipidemia: Secondary | ICD-10-CM | POA: Diagnosis not present

## 2020-02-05 DIAGNOSIS — I471 Supraventricular tachycardia: Secondary | ICD-10-CM

## 2020-02-05 NOTE — Progress Notes (Signed)
Bianca [name] Date of Birth: 09/03/1922 MRN: [id number] Primary Care Provider:[name], [name], NP Former Cardiology Providers: Dr. Adrian Prows Primary Cardiologist: Rex Kras, DO, [facility] (established care 02/05/2020)  Date: 02/05/20 Last Visit: 01/10/2020  Chief Complaint  Patient presents with  . Hospitalization Follow-up  . Atrial Flutter    HPI  Bianca [name] is a 86 y.o.  female who presents to the office with a chief complaint of " hospital follow-up." Patient's past medical history and cardiovascular risk factors include: Paroxysmal SVT, paroxysmal atrial flutter, hyperlipidemia, grade 2 diastolic dysfunction, cognitive impairment suggestive of possible age-related dementia, history of stroke, postmenopausal female, advanced age.  Patient was seen in consultation in October 2021 when she was hospitalized status post fall for prolonged QT interval.  She now presents for follow-up after recent hospitalization.  She is accompanied by her EMT driver and she provides verbal consent to discuss her care in his presence.   Since discharge patient states that she is doing well from a cardiovascular standpoint.  She denies any chest pain at rest or with effort related activities.  She also denies any palpitations.  She continues to be on Eliquis for thromboembolic prophylaxis given her history of paroxysmal atrial flutter.  Medications reconciled.  She brings in blood work for me to review; however, they are actually not her blood work but other residents who resides at [facility].   ALLERGIES: Allergies  Allergen Reactions  . Azopt [Brinzolamide] Other (See Comments)    UNK  . Brimonidine Other (See Comments)    UNK  . Lumigan [Bimatoprost] Other (See Comments)    UNK     MEDICATION LIST PRIOR TO VISIT: Current Outpatient Medications on File Prior to Visit  Medication Sig Dispense Refill  . acetaminophen (TYLENOL) 325 MG tablet Take 650 mg by mouth every 6 (six) hours as  needed for mild pain or headache.     . Amino Acids-Protein Hydrolys (FEEDING SUPPLEMENT, PRO-STAT 64,) LIQD Take 30 mLs by mouth daily.    Marland Kitchen apixaban (ELIQUIS) 2.5 MG TABS tablet Take 1 tablet (2.5 mg total) by mouth 2 (two) times daily. 60 tablet 1  . atorvastatin (LIPITOR) 20 MG tablet Take 1 tablet (20 mg total) by mouth daily at 6 PM. 30 tablet 1  . b complex vitamins tablet Take 1 tablet by mouth daily.     Marland Kitchen bismuth subsalicylate (PEPTO BISMOL) 262 MG/15ML suspension Take 60 mLs by mouth daily as needed for indigestion or diarrhea or loose stools.     . Calcium Carbonate-Vitamin D 600-400 MG-UNIT tablet Take 1 tablet by mouth daily.    . collagenase (SANTYL) ointment Apply topically daily. 15 g 0  . diphenoxylate-atropine (LOMOTIL) 2.5-0.025 MG tablet Take 1 tablet by mouth daily as needed for diarrhea or loose stools.     . Foot Care Products MISC by Does not apply route. Gel bunion cushion once a day on Wednesday    . lactose free nutrition (BOOST) LIQD Take 237 mLs by mouth daily.    . melatonin 3 MG TABS tablet Take 3 mg by mouth at bedtime.     . metoprolol tartrate (LOPRESSOR) 25 MG tablet Take 0.5 tablets (12.5 mg total) by mouth 2 (two) times daily. 60 tablet 0  . Multiple Vitamins-Minerals (CENTRUM SILVER PO) Take 1 tablet by mouth daily. 7am-10am.    . NON FORMULARY Moisturizing Cream to extremities with morning and evening care Every Shift    . NON FORMULARY Regular Special Instructions:  Regular Diet, Thin Liquids    . omeprazole (PRILOSEC) 20 MG capsule Take 20 mg by mouth daily.     . sertraline (ZOLOFT) 25 MG tablet Take 0.5 tablets (12.5 mg total) by mouth at bedtime. 30 tablet 0  . timolol (BETIMOL) 0.5 % ophthalmic solution Place 1 drop into both eyes daily. 7am-10am.    . zinc oxide 20 % ointment Apply 1 application topically as needed for irritation.     No current facility-administered medications on file prior to visit.    PAST MEDICAL HISTORY: Past Medical  History:  Diagnosis Date  . COVID-19   . 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  Of note, based on EMR unable to accurately verify with the patient due to underlying dementia.  PAST SURGICAL HISTORY: History reviewed. No pertinent surgical history.  FAMILY HISTORY: The patient's family history is not on file.   SOCIAL HISTORY:  The patient  reports that she has never smoked. She has never used smokeless tobacco. She reports that she does not drink alcohol and does not use drugs.  Review of Systems  Constitutional: Negative for chills and fever.  HENT: Negative for hoarse voice and nosebleeds.   Eyes: Negative for discharge, double vision and pain.  Cardiovascular: Negative for chest pain, claudication, dyspnea on exertion, leg swelling, near-syncope, orthopnea, palpitations, paroxysmal nocturnal dyspnea and syncope.  Respiratory: Negative for hemoptysis and shortness of breath.   Musculoskeletal: Positive for joint pain. Negative for muscle cramps and myalgias.  Gastrointestinal: Negative for abdominal pain, constipation, diarrhea, hematemesis, hematochezia, melena, nausea and vomiting.  Neurological: Negative for dizziness and light-headedness.    PHYSICAL EXAM: Vitals with BMI 02/05/2020 01/22/2020 01/14/2020  Height 5\' 0"  5\' 0"  5\' 0"   Weight 91 lbs 91 lbs 13 oz 91 lbs 3 oz  BMI 17.77 19.37 90.24  Systolic 097 353 299  Diastolic 44 72 67  Pulse 56 109 60    CONSTITUTIONAL: Elderly frail appearing female, present in wheelchair, hemodynamically stable, no acute distress.  SKIN: Skin is warm and dry. No rash noted. No cyanosis. No pallor. No jaundice HEAD: Normocephalic and atraumatic.  EYES: No scleral icterus MOUTH/THROAT: Moist oral membranes.   NECK: No JVD present. No thyromegaly noted. No carotid bruits  LYMPHATIC: No visible cervical adenopathy.  CHEST Normal respiratory effort. No intercostal retractions  LUNGS: Clear to auscultation bilaterally. No stridor. No wheezes. No rales.  CARDIOVASCULAR: Regular rate and rhythm, positive S1-S2, positive M4-Q6, holosystolic murmur heard at the left sternal border, no gallops or rubs ABDOMINAL: Obese, soft, nontender, nondistended, positive bowel sounds all 4 quadrants. No apparent ascites.  EXTREMITIES: No peripheral edema. Faint DP and PT.  HEMATOLOGIC: ecchymosis noted.  NEUROLOGIC: Oriented to person, place, and time. Nonfocal. Normal muscle tone.  PSYCHIATRIC: Normal mood and affect. Normal behavior. Cooperative  CARDIAC DATABASE: EKG: 11/03/2019: Marked sinus bradycardia at rate of 43 bpm, otherwise normal EKG.  11/06/2019: Atypical atrial flutter with 2: 1 conduction, ventricular rate 128 bpm. Left axis deviation, left anterior fascicular block. Poor R wave progression, cannot exclude anterolateral infarct old. Tachycardia associated NSVT, suggests lateral ischemia.  01/09/2020 1822: Sinus bradycardia, 59 bpm, borderline left axis deviation, possible old anterior infarct, prolonged QT calculated QTC 632's milliseconds, nonspecific ST-T changes.  01/09/2020 1907: Sinus bradycardia, 59 bpm, QTC 554 ms.  02/05/2020: Sinus bradycardia, 53 bpm, first-degree  AV block, poor R wave progression, nonspecific T wave abnormality, QTC 422 ms.    Echocardiogram: 10/27/2019: 1. Left ventricular ejection fraction, by estimation, is 60 to 65%. The left ventricle has normal function. The left ventricle has no regional  wall motion abnormalities. There is mild left ventricular hypertrophy. Left ventricular diastolic parameters  are consistent with Grade II diastolic dysfunction (pseudonormalization). Elevated left atrial pressure.  2. Right ventricular systolic function is normal. The  right ventricular size is normal. There is mildly elevated pulmonary artery systolic  pressure. The estimated right ventricular systolic pressure is 92.1 mmHg.  3. Right atrial size was mildly dilated.  4. The mitral valve is normal in structure. Trivial mitral valve regurgitation.  5. Tricuspid valve regurgitation is moderate to severe.  6. The aortic valve is tricuspid. Aortic valve regurgitation is mild. Mild aortic valve sclerosis is present, with no evidence of aortic valve stenosis.  7. The inferior vena cava is normal in size with greater than 50% respiratory variability, suggesting right atrial pressure of 3 mmHg.   Carotid artery duplex07/30/2021: Right Carotid: Velocities in the right ICA are consistent with a 1-39% stenosis.  Left Carotid: Velocities in the left ICA are consistent with a 1-39% stenosis.  Vertebrals: Bilateral vertebral arteries demonstrate antegrade flow.   LABORATORY DATA: CBC Latest Ref Rng & Units 01/11/2020 01/10/2020 01/09/2020  WBC 4.0 - 10.5 K/uL 7.5 7.6 6.9  Hemoglobin 12.0 - 15.0 g/dL 10.4(L) 10.9(L) 11.0(L)  Hematocrit 36 - 46 % 32.9(L) 34.8(L) 34.4(L)  Platelets 150 - 400 K/uL 281 287 305    CMP Latest Ref Rng & Units 01/11/2020 01/10/2020 01/09/2020  Glucose 70 - 99 mg/dL 91 86 101(H)  BUN 8 - 23 mg/dL 15 14 20   Creatinine 0.44 - 1.00 mg/dL 0.84 0.69 0.81  Sodium 135 - 145 mmol/L 142 142 138  Potassium 3.5 - 5.1 mmol/L 4.0 3.6 3.3(L)  Chloride 98 - 111 mmol/L 109 110 107  CO2 22 - 32 mmol/L 28 24 22   Calcium 8.9 - 10.3 mg/dL 8.0(L) 8.2(L) 8.1(L)  Total Protein 6.5 - 8.1 g/dL - - -  Total Bilirubin 0.3 - 1.2 mg/dL - - -  Alkaline Phos 25 - 125 - - -  AST 13 - 35 - - -  ALT 7 - 35 - - -    Lipid Panel     Component Value Date/Time   CHOL 138 11/06/2019 0544   TRIG 86 11/06/2019 0544   HDL 49 11/06/2019 0544   CHOLHDL 2.8 11/06/2019 0544   VLDL 17 11/06/2019 0544   LDLCALC 72 11/06/2019 0544    Lab Results  Component Value  Date   HGBA1C 5.9 (H) 11/04/2019   No components found for: NTPROBNP Lab Results  Component Value Date   TSH 4.060 01/11/2020   TSH 1.629 11/02/2019    Cardiac Panel (last 3 results) No results for input(s): CKTOTAL, CKMB, TROPONINIHS, RELINDX in the last 72 hours.  IMPRESSION:    ICD-10-CM   1. Paroxysmal SVT (supraventricular tachycardia) (HCC)  I47.1 EKG 12-Lead    CANCELED: EKG 12-Lead  2. Paroxysmal atrial flutter (HCC)  I48.92   3. Long term current use of anticoagulant  Z79.01   4. Mixed hyperlipidemia  E78.2      RECOMMENDATIONS: Bianca [name] is a 86 y.o. female whose past medical history and cardiovascular risk factors include:  Paroxysmal SVT, paroxysmal atrial flutter, hyperlipidemia, grade 2 diastolic dysfunction, cognitive impairment suggestive of possible age-related dementia, history of stroke, postmenopausal  female, advanced age.  Paroxysmal atrial flutter:  Currently sinus rhythm.  Currently on thromboembolic prophylaxis given the elevated chads vas score.  Hold amiodarone for now as she remains in NSR and recent hx of prolonged QT.   Continue Lopressor.   We will continue to monitor  Long-term oral anticoagulation: Indication paroxysmal atrial flutter. Risks, benefits, alternatives to oral anticoagulation reemphasized. Fall precautions reviewed.  Paroxysmal supraventricular tachycardia: Currently asymptomatic. Continue beta-blocker therapy.  Hyperlipidemia: Continue statin therapy.  Patient is asked to get blood work done at her facility under the care of her primary care provider and have a copy faxed over to the office for review.  Of note, patient's old EKGs note dynamic changes suggestive of possible ischemia. We discussed considering stress test to evaluate for reversible ischemia. However, patient refuses as she currently does not have ischemic symptoms and given her age would like to be treated conservatively. I think this is quite  appropriate given her age and other comorbid conditions. She is asked to seek medical attention if she does develop symptoms suggestive of chest pain and/or angina. She verbalizes understanding.   FINAL MEDICATION LIST END OF ENCOUNTER: No orders of the defined types were placed in this encounter.   There are no discontinued medications.   Current Outpatient Medications:  .  acetaminophen (TYLENOL) 325 MG tablet, Take 650 mg by mouth every 6 (six) hours as needed for mild pain or headache. , Disp: , Rfl:  .  Amino Acids-Protein Hydrolys (FEEDING SUPPLEMENT, PRO-STAT 64,) LIQD, Take 30 mLs by mouth daily., Disp: , Rfl:  .  apixaban (ELIQUIS) 2.5 MG TABS tablet, Take 1 tablet (2.5 mg total) by mouth 2 (two) times daily., Disp: 60 tablet, Rfl: 1 .  atorvastatin (LIPITOR) 20 MG tablet, Take 1 tablet (20 mg total) by mouth daily at 6 PM., Disp: 30 tablet, Rfl: 1 .  b complex vitamins tablet, Take 1 tablet by mouth daily. , Disp: , Rfl:  .  bismuth subsalicylate (PEPTO BISMOL) 262 MG/15ML suspension, Take 60 mLs by mouth daily as needed for indigestion or diarrhea or loose stools. , Disp: , Rfl:  .  Calcium Carbonate-Vitamin D 600-400 MG-UNIT tablet, Take 1 tablet by mouth daily., Disp: , Rfl:  .  collagenase (SANTYL) ointment, Apply topically daily., Disp: 15 g, Rfl: 0 .  diphenoxylate-atropine (LOMOTIL) 2.5-0.025 MG tablet, Take 1 tablet by mouth daily as needed for diarrhea or loose stools. , Disp: , Rfl:  .  Foot Care Products MISC, by Does not apply route. Gel bunion cushion once a day on Wednesday, Disp: , Rfl:  .  lactose free nutrition (BOOST) LIQD, Take 237 mLs by mouth daily., Disp: , Rfl:  .  melatonin 3 MG TABS tablet, Take 3 mg by mouth at bedtime. , Disp: , Rfl:  .  metoprolol tartrate (LOPRESSOR) 25 MG tablet, Take 0.5 tablets (12.5 mg total) by mouth 2 (two) times daily., Disp: 60 tablet, Rfl: 0 .  Multiple Vitamins-Minerals (CENTRUM SILVER PO), Take 1 tablet by mouth daily.  7am-10am., Disp: , Rfl:  .  NON FORMULARY, Moisturizing Cream to extremities with morning and evening care Every Shift, Disp: , Rfl:  .  NON FORMULARY, Regular Special Instructions: Regular Diet, Thin Liquids, Disp: , Rfl:  .  omeprazole (PRILOSEC) 20 MG capsule, Take 20 mg by mouth daily. , Disp: , Rfl:  .  sertraline (ZOLOFT) 25 MG tablet, Take 0.5 tablets (12.5 mg total) by mouth at bedtime., Disp: 30 tablet, Rfl: 0 .  timolol (BETIMOL) 0.5 % ophthalmic solution, Place 1 drop into both eyes daily. 7am-10am., Disp: , Rfl:  .  zinc oxide 20 % ointment, Apply 1 application topically as needed for irritation., Disp: , Rfl:   Orders Placed This Encounter  Procedures  . EKG 12-Lead   --Continue cardiac medications as reconciled in final medication list. --Return in about 6 months (around 08/05/2020) for Atrial flutter. Or sooner if needed. --Continue follow-up with your primary care physician regarding the management of your other chronic comorbid conditions.  Patient's questions and concerns were addressed to her satisfaction. She voices understanding of the instructions provided during this encounter.   During this visit I reviewed and updated: Tobacco history  allergies medication reconciliation  medical history  surgical history  family history  social history.  This note was created using a voice recognition software as a result there may be grammatical errors inadvertently enclosed that do not reflect the nature of this encounter. Every attempt is made to correct such errors.  Rex Kras, [place], [facility]  Pager: [phone] Office: [phone]

## 2020-02-11 DIAGNOSIS — L821 Other seborrheic keratosis: Secondary | ICD-10-CM | POA: Diagnosis not present

## 2020-02-11 DIAGNOSIS — D225 Melanocytic nevi of trunk: Secondary | ICD-10-CM | POA: Diagnosis not present

## 2020-02-11 DIAGNOSIS — L570 Actinic keratosis: Secondary | ICD-10-CM | POA: Diagnosis not present

## 2020-02-11 DIAGNOSIS — L814 Other melanin hyperpigmentation: Secondary | ICD-10-CM | POA: Diagnosis not present

## 2020-02-16 ENCOUNTER — Non-Acute Institutional Stay (SKILLED_NURSING_FACILITY): Payer: Medicare Other | Admitting: Nurse Practitioner

## 2020-02-16 ENCOUNTER — Encounter: Payer: Self-pay | Admitting: Nurse Practitioner

## 2020-02-16 DIAGNOSIS — K219 Gastro-esophageal reflux disease without esophagitis: Secondary | ICD-10-CM

## 2020-02-16 DIAGNOSIS — D509 Iron deficiency anemia, unspecified: Secondary | ICD-10-CM

## 2020-02-16 DIAGNOSIS — F339 Major depressive disorder, recurrent, unspecified: Secondary | ICD-10-CM | POA: Diagnosis not present

## 2020-02-16 DIAGNOSIS — R609 Edema, unspecified: Secondary | ICD-10-CM

## 2020-02-16 DIAGNOSIS — I471 Supraventricular tachycardia: Secondary | ICD-10-CM

## 2020-02-16 DIAGNOSIS — R413 Other amnesia: Secondary | ICD-10-CM | POA: Diagnosis not present

## 2020-02-16 NOTE — Assessment & Plan Note (Signed)
Stable, continue Omeprazole.  

## 2020-02-16 NOTE — Assessment & Plan Note (Signed)
Anemia, 01/21/20 Iron 44, ferritin 63, Vit B12 695, Hgb 10.7

## 2020-02-16 NOTE — Assessment & Plan Note (Signed)
Vascular dementia since CVA, no behavioral issues. Hospital stay 11/02/19-11/11/19 for acute CVA (MRI cevebral small acute infarcts in the posterior left hemisphere in the left PCA and MCA watershed area

## 2020-02-16 NOTE — Assessment & Plan Note (Addendum)
PSVT off Amiodarone 01/2020 in hospital due to prolonged QT interval/syncope/fall due, started Metoprolol, HR apical 60 bpm upon my examination, continued Eliquis.

## 2020-02-16 NOTE — Assessment & Plan Note (Signed)
Her mood is stable, continue Sertraline.  

## 2020-02-16 NOTE — Progress Notes (Signed)
Location:    [facility] [place] Room Number: 36 Place of Service:  SNF (31) Provider: Marlana Latus NP  Callum Wolf X, NP  Patient Care Team: Alexio Sroka X, NP as PCP - General (Internal Medicine)  Extended Emergency Contact Information Primary Emergency Contact: [name] Mobile Phone: [phone] Relation: Friend Secondary Emergency Contact: [name] Mobile Phone: [phone] Relation: Nephew  Code Status:  DNR Goals of care: Advanced Directive information Advanced Directives 01/14/2020  Does Patient Have a Medical Advance Directive? Yes  Type of Advance Directive Living will;Out of facility DNR (pink MOST or yellow form);Healthcare Power of Attorney  Does patient want to make changes to medical advance directive? No - Patient declined  Copy of [place] in Chart? Yes - validated most recent copy scanned in chart (See row information)  Pre-existing out of facility DNR order (yellow form or pink MOST form) Yellow form placed in chart (order not valid for inpatient use)     Chief Complaint  Patient presents with  . Medical Management of Chronic Issues  . Health Maintenance    TDAP, PNA    HPI:  [name] seen today for medical management of chronic diseases.    Edema,trace offTorsemide.  PSVT off Amiodarone 01/2020 in hospital due to prolonged QT interval/syncope/fall due, started Metoprolol, continued Eliquis.  GERD, takes Omeprazole.  Depression, takes Sertraline.             Vascular dementia since CVA, no behavioral issues. Hospital stay 11/02/19-11/11/19 for acute CVA (MRI cevebral small acute infarcts in the posterior left hemisphere in the left PCA and MCA watershed area.              Anemia, 01/21/20 Iron 44, ferritin 63, Vit B12 695, Hgb 10.7    Past Medical History:  Diagnosis Date  . COVID-19   . 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 No pertinent surgical history.  Allergies  Allergen Reactions  . Azopt [Brinzolamide] Other (See Comments)    UNK  . Brimonidine Other (See Comments)    UNK  . Lumigan [Bimatoprost] Other (See Comments)    UNK    Allergies as of 02/16/2020      Reactions   Azopt [brinzolamide] Other (See Comments)   UNK   Brimonidine Other (See Comments)   UNK   Lumigan [bimatoprost] Other (See Comments)   UNK      Medication List       Accurate as of February 16, 2020 11:59 PM. If you have any questions, ask your nurse or doctor.        acetaminophen 325 MG tablet Commonly known as: TYLENOL Take 650 mg by mouth every 6 (six) hours as needed for mild pain or headache.   apixaban 2.5 MG Tabs tablet Commonly known as: ELIQUIS Take 1 tablet (2.5 mg total) by mouth 2 (two) times daily.   atorvastatin 20 MG tablet Commonly known as: LIPITOR Take 1 tablet (20 mg total) by mouth daily at 6 PM.   b complex vitamins tablet Take 1 tablet by mouth daily.   bismuth subsalicylate 824 MP/53IR suspension Commonly known as: PEPTO BISMOL Take 60 mLs by mouth daily as needed for indigestion or diarrhea or loose stools.   Calcium Carbonate-Vitamin D 600-400 MG-UNIT tablet Take 1 tablet by mouth daily.   CENTRUM SILVER PO Take 1 tablet by mouth daily. 7am-10am.   collagenase  ointment Commonly known as: SANTYL Apply topically daily.   diphenoxylate-atropine 2.5-0.025 MG tablet Commonly known as: LOMOTIL Take 1 tablet by mouth daily as needed for diarrhea or loose stools.   feeding supplement (PRO-STAT 64) Liqd Take 30 mLs by mouth daily.   Foot Care Products Misc by Does not apply route. Gel bunion cushion once a day on Wednesday   lactose free nutrition Liqd Take 237 mLs by mouth daily.   melatonin 3 MG Tabs tablet Take 3 mg by mouth at bedtime.   metoprolol tartrate 25 MG tablet Commonly known as: LOPRESSOR Take 0.5 tablets (12.5  mg total) by mouth 2 (two) times daily.   NON FORMULARY Moisturizing Cream to extremities with morning and evening care Every Shift   NON FORMULARY Regular Special Instructions: Regular Diet, Thin Liquids   omeprazole 20 MG capsule Commonly known as: PRILOSEC Take 20 mg by mouth daily.   sertraline 25 MG tablet Commonly known as: ZOLOFT Take 0.5 tablets (12.5 mg total) by mouth at bedtime.   timolol 0.5 % ophthalmic solution Commonly known as: BETIMOL Place 1 drop into both eyes daily. 7am-10am.   zinc oxide 20 % ointment Apply 1 application topically as needed for irritation.       Review of Systems  Constitutional: Negative for fatigue, fever and unexpected weight change.  HENT: Positive for hearing loss and trouble swallowing. Negative for congestion and voice change.   Eyes: Negative for visual disturbance.  Respiratory: Negative for cough and shortness of breath.   Cardiovascular: Positive for leg swelling. Negative for palpitations.       RLE  Gastrointestinal: Negative for abdominal pain and constipation.  Genitourinary: Positive for frequency. Negative for dysuria and urgency.  Musculoskeletal: Positive for gait problem.  Skin: Negative for wound.  Neurological: Negative for speech difficulty, weakness, light-headedness and headaches.  Psychiatric/Behavioral: Positive for confusion. Negative for behavioral problems and sleep disturbance.    Immunization History  Administered Date(s) Administered  . Influenza, High Dose Seasonal PF 11/08/2018  . Influenza-Unspecified 11/07/2013, 01/14/2020  . Moderna SARS-COVID-2 Vaccination 05/09/2019, 06/06/2019  . Pneumococcal-Unspecified 10/18/2014   Pertinent  Health Maintenance Due  Topic Date Due  . PNA vac Low Risk Adult (2 of 2 - PCV13) 10/18/2015  . INFLUENZA VACCINE  Completed  . DEXA SCAN  Completed   Fall Risk  05/19/2019 05/14/2019  Falls in the past year? 0 0  Number falls in past yr: 0 0  Injury with Fall?  0 -   Functional Status Survey:    Vitals:   02/16/20 1554  BP: 130/63  Pulse: (!) 53  Resp: 19  Temp: 97.8 F (36.6 C)  SpO2: 97%  Weight: 93 lb 3.2 oz (42.3 kg)  Height: 5' (1.524 m)   Body mass index is 18.2 kg/m. Physical Exam Vitals and nursing note reviewed.  Constitutional:      Appearance: Normal appearance.  HENT:     Head: Normocephalic and atraumatic.     Mouth/Throat:     Mouth: Mucous membranes are moist.  Eyes:     Extraocular Movements: Extraocular movements intact.     Conjunctiva/sclera: Conjunctivae normal.     Pupils: Pupils are equal, round, and reactive to light.  Cardiovascular:     Rate and Rhythm: Normal rate and regular rhythm.     Heart sounds: No murmur heard.   Pulmonary:     Breath sounds: No wheezing or rales.  Abdominal:     General: Bowel sounds are normal.  Palpations: Abdomen is soft.     Tenderness: There is no abdominal tenderness.  Musculoskeletal:     Cervical back: Normal range of motion and neck supple.     Right lower leg: Edema present.     Left lower leg: Edema present.     Comments: Trace edema BLE  Skin:    General: Skin is warm and dry.     Comments:  the right lower leg traumatic wound is healed.   Neurological:     General: No focal deficit present.     Mental Status: She is alert and oriented to person, place, and time. Mental status is at baseline.     Motor: No weakness.     Coordination: Coordination normal.     Gait: Gait abnormal.  Psychiatric:        Mood and Affect: Mood normal.        Behavior: Behavior normal.        Thought Content: Thought content normal.     Labs reviewed: Recent Labs    11/03/19 0454 11/04/19 0535 01/09/20 1820 01/09/20 1820 01/09/20 2238 01/10/20 0448 01/11/20 0154 01/21/20 0000  NA 143   < > 138   < >  --  142 142 143  K 4.1   < > 3.3*   < >  --  3.6 4.0 4.2  CL 103   < > 107   < >  --  110 109 109*  CO2 22   < > 22   < >  --  24 28 26*  GLUCOSE 76   < > 101*   --   --  86 91  --   BUN 32*   < > 20   < >  --  14 15 17   CREATININE 1.02*   < > 0.81   < >  --  0.69 0.84 0.8  CALCIUM 8.5*   < > 8.1*   < >  --  8.2* 8.0* 8.1*  MG 2.3  --   --   --  1.9 2.0  --   --    < > = values in this interval not displayed.   Recent Labs    11/02/19 1045 11/02/19 1045 11/03/19 0454 11/03/19 0454 11/06/19 0544 11/19/19 0000 01/21/20 0000  AST 34   < > 32   < > 34 24 22  ALT 19   < > 18   < > 18 18 19   ALKPHOS 64   < > 57   < > 45 64 73  BILITOT 0.8  --  0.9  --  0.3  --   --   PROT 6.9  --  6.1*  --  5.1*  --   --   ALBUMIN 4.0   < > 3.5   < > 2.9* 3.7 3.1*   < > = values in this interval not displayed.   Recent Labs    10/26/19 0000 11/02/19 1045 01/09/20 1820 01/09/20 1820 01/10/20 0448 01/11/20 0154 01/21/20 0000  WBC 7.8   < > 6.9   < > 7.6 7.5 6.0  NEUTROABS 5,936  --  5.1  --   --  5.6  --   HGB 13.9   < > 11.0*   < > 10.9* 10.4* 10.7*  HCT 42   < > 34.4*   < > 34.8* 32.9* 33*  MCV  --    < > 99.7  --  99.7 99.7  --  PLT 251   < > 305   < > 287 281 261   < > = values in this interval not displayed.   Lab Results  Component Value Date   TSH 4.060 01/11/2020   Lab Results  Component Value Date   HGBA1C 5.9 (H) 11/04/2019   Lab Results  Component Value Date   CHOL 138 11/06/2019   HDL 49 11/06/2019   LDLCALC 72 11/06/2019   TRIG 86 11/06/2019   CHOLHDL 2.8 11/06/2019    Significant Diagnostic Results in last 30 days:  No results found.  Assessment/Plan Anemia, iron deficiency Anemia, 01/21/20 Iron 44, ferritin 63, Vit B12 695, Hgb 10.7   Memory deficit Vascular dementia since CVA, no behavioral issues. Hospital stay 11/02/19-11/11/19 for acute CVA (MRI cevebral small acute infarcts in the posterior left hemisphere in the left PCA and MCA watershed area  Depression, recurrent ([place]) Her mood is stable, continue Sertraline.   GERD (gastroesophageal reflux disease) Stable, continue Omeprazole.   Paroxysmal SVT  (supraventricular tachycardia) (HCC) PSVT off Amiodarone 01/2020 in hospital due to prolonged QT interval/syncope/fall due, started Metoprolol, HR apical 60 bpm upon my examination, continued Eliquis.    Edema Trace edema BLE, off Torsemide.    Family/ staff Communication: plan of care reviewed with the patient and charge nurse.   Labs/tests ordered: none  Time spend 35 minutes.

## 2020-02-16 NOTE — Assessment & Plan Note (Signed)
Trace edema BLE, off Torsemide.

## 2020-02-17 ENCOUNTER — Encounter: Payer: Self-pay | Admitting: Nurse Practitioner

## 2020-02-22 DIAGNOSIS — Z23 Encounter for immunization: Secondary | ICD-10-CM | POA: Diagnosis not present

## 2020-02-23 ENCOUNTER — Non-Acute Institutional Stay (SKILLED_NURSING_FACILITY): Payer: Medicare Other | Admitting: Nurse Practitioner

## 2020-02-23 ENCOUNTER — Encounter: Payer: Self-pay | Admitting: Nurse Practitioner

## 2020-02-23 DIAGNOSIS — R413 Other amnesia: Secondary | ICD-10-CM

## 2020-02-23 DIAGNOSIS — I639 Cerebral infarction, unspecified: Secondary | ICD-10-CM

## 2020-02-23 DIAGNOSIS — K219 Gastro-esophageal reflux disease without esophagitis: Secondary | ICD-10-CM | POA: Diagnosis not present

## 2020-02-23 DIAGNOSIS — I471 Supraventricular tachycardia: Secondary | ICD-10-CM

## 2020-02-23 DIAGNOSIS — F339 Major depressive disorder, recurrent, unspecified: Secondary | ICD-10-CM

## 2020-02-23 DIAGNOSIS — D509 Iron deficiency anemia, unspecified: Secondary | ICD-10-CM

## 2020-02-23 NOTE — Assessment & Plan Note (Signed)
Anemia, 01/21/20 Iron 44, ferritin 63, Vit B12 695, Hgb 10.7

## 2020-02-23 NOTE — Assessment & Plan Note (Signed)
GERD, takes Omeprazole  

## 2020-02-23 NOTE — Assessment & Plan Note (Signed)
Hospital stay 11/02/19-11/11/19 for acute CVA (MRI cevebral small acute infarcts in the posterior left hemisphere in the left PCA and MCA watershed area.

## 2020-02-23 NOTE — Assessment & Plan Note (Signed)
Depression, takes Sertraline.  

## 2020-02-23 NOTE — Progress Notes (Signed)
Location:    [place] Room Number: 36 Place of Service:  SNF (31)  Provider: Marlana Latus NP  PCP: Oanh Devivo X, NP Patient Care Team: Claudene Gatliff X, NP as PCP - General (Internal Medicine)  Extended Emergency Contact Information Primary Emergency Contact: [name] Mobile Phone: [phone] Relation: Friend Secondary Emergency Contact: [name] Mobile Phone: [phone] Relation: Nephew  Code Status: DNR Goals of care:  Advanced Directive information Advanced Directives 01/14/2020  Does Patient Have a Medical Advance Directive? Yes  Type of Advance Directive Living will;Out of facility DNR (pink MOST or yellow form);Healthcare Power of Attorney  Does patient want to make changes to medical advance directive? No - Patient declined  Copy of [place] in Chart? Yes - validated most recent copy scanned in chart (See row information)  Pre-existing out of facility DNR order (yellow form or pink MOST form) Yellow form placed in chart (order not valid for inpatient use)     Allergies  Allergen Reactions   Azopt [Brinzolamide] Other (See Comments)    UNK   Brimonidine Other (See Comments)    UNK   Lumigan [Bimatoprost] Other (See Comments)    UNK    Chief Complaint  Patient presents with   Discharge Note    Discharge from [facility]    HPI:  86 y.o. female with history of Edema, PSVT, GERD, depression, vascular dementia, anemia was admitted to [facility] for therapy  following hospital stay for syncope, fall due to prolonged QT interval. She was off Amiodarone 01/2020 in hospital due to prolonged QT interval/syncope/fall due, started Metoprolol, continuedEliquis.   The patient has regained physical strength and ADL dunction, her heart rate has been controlled, no further syncope episodes. She is ready to return to [place] where she resided prior.    Edema,trace offTorsemide.  PSVToff Amiodarone 01/2020 in hospital due  to prolonged QT interval/syncope/fall due, started Metoprolol, continuedEliquis.  GERD, takes Omeprazole.  Depression, takes Sertraline. Vascular dementia since CVA, no behavioral issues.   Hospital stay 11/02/19-11/11/19 for acute CVA (MRI cevebral small acute infarcts in the posterior left hemisphere in the left PCA and MCA watershed area.  Anemia, 01/21/20 Iron 44, ferritin 63, Vit B12 695, Hgb 10.7    Past Medical History:  Diagnosis Date   COVID-19    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 ([place])     History reviewed. No pertinent surgical history.    reports that she has never smoked. She has never used smokeless tobacco. She reports that she does not drink alcohol and does not use drugs. Social History   Socioeconomic History   Marital status: Widowed    Spouse name: Not on file   Number of children: Not on file   Years of education: Not on file   Highest education level: Not on file  Occupational History   Not on file  Tobacco Use   Smoking status: Never Smoker   Smokeless tobacco: Never Used  Vaping Use   Vaping Use: Never used  Substance and Sexual Activity   Alcohol use: Never   Drug use: Never   Sexual activity: Not on file  Other Topics Concern   Not on file  Social History Narrative   Not on file   Social Determinants of Health   Financial Resource Strain:    Difficulty of Paying Living Expenses: Not on file  Food Insecurity:    Worried About Running Out of  Food in the Last Year: Not on file   Ran Out of Food in the Last Year: Not on file  Transportation Needs:    Lack of Transportation (Medical): Not on file   Lack of Transportation (Non-Medical): Not on file  Physical Activity:    Days of Exercise per Week: Not on file   Minutes of Exercise per Session: Not on file  Stress:    Feeling of Stress : Not on file  Social  Connections:    Frequency of Communication with Friends and Family: Not on file   Frequency of Social Gatherings with Friends and Family: Not on file   Attends Religious Services: Not on file   Active Member of Clubs or Organizations: Not on file   Attends Archivist Meetings: Not on file   Marital Status: Not on file  Intimate Partner Violence:    Fear of Current or Ex-Partner: Not on file   Emotionally Abused: Not on file   Physically Abused: Not on file   Sexually Abused: Not on file   Functional Status Survey:    Allergies  Allergen Reactions   Azopt [Brinzolamide] Other (See Comments)    UNK   Brimonidine Other (See Comments)    UNK   Lumigan [Bimatoprost] Other (See Comments)    UNK    Pertinent  Health Maintenance Due  Topic Date Due   PNA vac Low Risk Adult (2 of 2 - PCV13) 10/18/2015   INFLUENZA VACCINE  Completed   DEXA SCAN  Completed    Medications: Allergies as of 02/23/2020      Reactions   Azopt [brinzolamide] Other (See Comments)   UNK   Brimonidine Other (See Comments)   UNK   Lumigan [bimatoprost] Other (See Comments)   UNK      Medication List       Accurate as of February 23, 2020 11:59 PM. If you have any questions, ask your nurse or doctor.        acetaminophen 325 MG tablet Commonly known as: TYLENOL Take 650 mg by mouth every 6 (six) hours as needed for mild pain or headache.   apixaban 2.5 MG Tabs tablet Commonly known as: ELIQUIS Take 1 tablet (2.5 mg total) by mouth 2 (two) times daily.   atorvastatin 20 MG tablet Commonly known as: LIPITOR Take 1 tablet (20 mg total) by mouth daily at 6 PM.   b complex vitamins tablet Take 1 tablet by mouth daily.   bismuth subsalicylate 673 AL/93XT suspension Commonly known as: PEPTO BISMOL Take 60 mLs by mouth daily as needed for indigestion or diarrhea or loose stools.   Calcium Carbonate-Vitamin D 600-400 MG-UNIT tablet Take 1 tablet by mouth daily.    CENTRUM SILVER PO Take 1 tablet by mouth daily. 7am-10am.   collagenase ointment Commonly known as: SANTYL Apply topically daily.   diphenoxylate-atropine 2.5-0.025 MG tablet Commonly known as: LOMOTIL Take 1 tablet by mouth daily as needed for diarrhea or loose stools.   feeding supplement (PRO-STAT 64) Liqd Take 30 mLs by mouth daily.   Foot Care Products Misc by Does not apply route. Gel bunion cushion once a day on Wednesday   lactose free nutrition Liqd Take 237 mLs by mouth daily.   melatonin 3 MG Tabs tablet Take 3 mg by mouth at bedtime.   metoprolol tartrate 25 MG tablet Commonly known as: LOPRESSOR Take 0.5 tablets (12.5 mg total) by mouth 2 (two) times daily.   NON FORMULARY Moisturizing Cream to  extremities with morning and evening care Every Shift   NON FORMULARY Regular Special Instructions: Regular Diet, Thin Liquids   omeprazole 20 MG capsule Commonly known as: PRILOSEC Take 20 mg by mouth daily.   sertraline 25 MG tablet Commonly known as: ZOLOFT Take 0.5 tablets (12.5 mg total) by mouth at bedtime.   timolol 0.5 % ophthalmic solution Commonly known as: BETIMOL Place 1 drop into both eyes daily. 7am-10am.   zinc oxide 20 % ointment Apply 1 application topically as needed for irritation.       Review of Systems  Constitutional: Negative for fatigue, fever and unexpected weight change.  HENT: Positive for hearing loss and trouble swallowing. Negative for congestion and voice change.   Eyes: Negative for visual disturbance.  Respiratory: Negative for cough and shortness of breath.   Cardiovascular: Positive for leg swelling. Negative for palpitations.       RLE  Gastrointestinal: Negative for abdominal pain and constipation.  Genitourinary: Positive for frequency. Negative for dysuria and urgency.  Musculoskeletal: Positive for gait problem.  Skin: Negative for wound.  Neurological: Negative for speech difficulty, weakness,  light-headedness and headaches.  Psychiatric/Behavioral: Positive for confusion. Negative for behavioral problems and sleep disturbance.    Vitals:   02/23/20 1338  BP: (!) 111/59  Pulse: 60  Resp: 18  Temp: 98.1 F (36.7 C)  SpO2: 98%  Weight: 93 lb 3.2 oz (42.3 kg)  Height: 5' (1.524 m)   Body mass index is 18.2 kg/m. Physical Exam Vitals and nursing note reviewed.  Constitutional:      Appearance: Normal appearance.  HENT:     Head: Normocephalic and atraumatic.     Mouth/Throat:     Mouth: Mucous membranes are moist.  Eyes:     Extraocular Movements: Extraocular movements intact.     Conjunctiva/sclera: Conjunctivae normal.     Pupils: Pupils are equal, round, and reactive to light.  Cardiovascular:     Rate and Rhythm: Normal rate and regular rhythm.     Heart sounds: No murmur heard.   Pulmonary:     Breath sounds: No wheezing or rales.  Abdominal:     General: Bowel sounds are normal.     Palpations: Abdomen is soft.     Tenderness: There is no abdominal tenderness.  Musculoskeletal:     Cervical back: Normal range of motion and neck supple.     Right lower leg: Edema present.     Left lower leg: Edema present.     Comments: Trace edema BLE  Skin:    General: Skin is warm and dry.     Comments:  the right lower leg traumatic wound is healed.   Neurological:     General: No focal deficit present.     Mental Status: She is alert and oriented to person, place, and time. Mental status is at baseline.     Motor: No weakness.     Coordination: Coordination normal.     Gait: Gait abnormal.  Psychiatric:        Mood and Affect: Mood normal.        Behavior: Behavior normal.        Thought Content: Thought content normal.     Labs reviewed: Basic Metabolic Panel: Recent Labs    11/03/19 0454 11/04/19 0535 01/09/20 1820 01/09/20 1820 01/09/20 2238 01/10/20 0448 01/11/20 0154 01/21/20 0000  NA 143   < > 138   < >  --  142 142 143  K 4.1   < >  3.3*    < >  --  3.6 4.0 4.2  CL 103   < > 107   < >  --  110 109 109*  CO2 22   < > 22   < >  --  24 28 26*  GLUCOSE 76   < > 101*  --   --  86 91  --   BUN 32*   < > 20   < >  --  14 15 17   CREATININE 1.02*   < > 0.81   < >  --  0.69 0.84 0.8  CALCIUM 8.5*   < > 8.1*   < >  --  8.2* 8.0* 8.1*  MG 2.3  --   --   --  1.9 2.0  --   --    < > = values in this interval not displayed.   Liver Function Tests: Recent Labs    11/02/19 1045 11/02/19 1045 11/03/19 0454 11/03/19 0454 11/06/19 0544 11/19/19 0000 01/21/20 0000  AST 34   < > 32   < > 34 24 22  ALT 19   < > 18   < > 18 18 19   ALKPHOS 64   < > 57   < > 45 64 73  BILITOT 0.8  --  0.9  --  0.3  --   --   PROT 6.9  --  6.1*  --  5.1*  --   --   ALBUMIN 4.0   < > 3.5   < > 2.9* 3.7 3.1*   < > = values in this interval not displayed.   No results for input(s): LIPASE, AMYLASE in the last 8760 hours. Recent Labs    11/02/19 1045  AMMONIA 11   CBC: Recent Labs    10/26/19 0000 11/02/19 1045 01/09/20 1820 01/09/20 1820 01/10/20 0448 01/11/20 0154 01/21/20 0000  WBC 7.8   < > 6.9   < > 7.6 7.5 6.0  NEUTROABS 5,936  --  5.1  --   --  5.6  --   HGB 13.9   < > 11.0*   < > 10.9* 10.4* 10.7*  HCT 42   < > 34.4*   < > 34.8* 32.9* 33*  MCV  --    < > 99.7  --  99.7 99.7  --   PLT 251   < > 305   < > 287 281 261   < > = values in this interval not displayed.   Cardiac Enzymes: No results for input(s): CKTOTAL, CKMB, CKMBINDEX, TROPONINI in the last 8760 hours. BNP: Invalid input(s): POCBNP CBG: Recent Labs    01/11/20 0445 01/12/20 0504  GLUCAP 92 89    Procedures and Imaging Studies During Stay: No results found.  Assessment/Plan:   Paroxysmal SVT (supraventricular tachycardia) (HCC) PSVToff Amiodarone 01/2020 in hospital due to prolonged QT interval/syncope/fall due, started Metoprolol, continuedEliquis.    Stroke [facility]) Hospital stay 11/02/19-11/11/19 for acute CVA (MRI cevebral small acute infarcts in the  posterior left hemisphere in the left PCA and MCA watershed area.    GERD (gastroesophageal reflux disease) GERD, takes Omeprazole.   Depression, recurrent ([place]) Depression, takes Sertraline.   Memory deficit Vascular dementia since CVA, no behavioral issues.    Anemia, iron deficiency Anemia, 01/21/20 Iron 44, ferritin 63, Vit B12 695, Hgb 10.7    Patient is being discharged with the following home health services:    Patient is being discharged with the  following durable medical equipment:    Patient has been advised to f/u with their PCP in 1-2 weeks to bring them up to date on their rehab stay.  Social services at facility was responsible for arranging this appointment.  Pt was provided with a 30 day supply of prescriptions for medications and refills must be obtained from their PCP.  For controlled substances, a more limited supply may be provided adequate until PCP appointment only.  Future labs/tests needed:  none

## 2020-02-23 NOTE — Assessment & Plan Note (Signed)
PSVToff Amiodarone 01/2020 in hospital due to prolonged QT interval/syncope/fall due, started Metoprolol, continuedEliquis.

## 2020-02-23 NOTE — Assessment & Plan Note (Signed)
Vascular dementia since CVA, no behavioral issues.

## 2020-02-24 ENCOUNTER — Encounter: Payer: Self-pay | Admitting: Nurse Practitioner

## 2020-02-25 ENCOUNTER — Other Ambulatory Visit: Payer: Self-pay

## 2020-02-25 MED ORDER — DIPHENOXYLATE-ATROPINE 2.5-0.025 MG PO TABS: 1.0000 | ORAL_TABLET | Freq: Every day | ORAL | 0 refills | Status: DC | PRN

## 2020-02-25 NOTE — Telephone Encounter (Signed)
[facility] request refill on medication "Diphen/Atro Tab 2.5mg . Please Advise.

## 2020-03-02 NOTE — Progress Notes (Signed)
External Labs: Collected: 02/08/2020 Creatinine 0.95 mg/dL. Potassium 4.5 Hemoglobin A1c: 5.1

## 2020-03-15 ENCOUNTER — Encounter: Payer: Self-pay | Admitting: Internal Medicine

## 2020-03-15 ENCOUNTER — Non-Acute Institutional Stay: Payer: Medicare Other | Admitting: Internal Medicine

## 2020-03-15 DIAGNOSIS — L89892 Pressure ulcer of other site, stage 2: Secondary | ICD-10-CM

## 2020-03-15 DIAGNOSIS — D509 Iron deficiency anemia, unspecified: Secondary | ICD-10-CM

## 2020-03-15 DIAGNOSIS — I639 Cerebral infarction, unspecified: Secondary | ICD-10-CM | POA: Diagnosis not present

## 2020-03-15 DIAGNOSIS — R413 Other amnesia: Secondary | ICD-10-CM | POA: Diagnosis not present

## 2020-03-15 DIAGNOSIS — I471 Supraventricular tachycardia: Secondary | ICD-10-CM

## 2020-03-15 DIAGNOSIS — F339 Major depressive disorder, recurrent, unspecified: Secondary | ICD-10-CM

## 2020-03-15 DIAGNOSIS — K219 Gastro-esophageal reflux disease without esophagitis: Secondary | ICD-10-CM

## 2020-03-15 DIAGNOSIS — R609 Edema, unspecified: Secondary | ICD-10-CM | POA: Diagnosis not present

## 2020-03-15 NOTE — Progress Notes (Signed)
Location:   [facility] Room Number: 509 Place of Service:  ALF [phone]) Provider:  Veleta Miners MD  [name], [name], NP  Patient Care Team: [name], [name], NP as PCP - General (Internal Medicine)  Extended Emergency Contact Information Primary Emergency Contact: [name] Mobile Phone: [phone] Relation: Friend Secondary Emergency Contact: [name] Mobile Phone: [phone] Relation: Nephew  Code Status:  DNR Goals of care: Advanced Directive information Advanced Directives 01/14/2020  Does Patient Have a Medical Advance Directive? Yes  Type of Advance Directive Living will;Out of facility DNR (pink MOST or yellow form);Healthcare Power of Attorney  Does patient want to make changes to medical advance directive? No - Patient declined  Copy of [place] in Chart? Yes - validated most recent copy scanned in chart (See row information)  Pre-existing out of facility DNR order (yellow form or pink MOST form) Yellow form placed in chart (order not valid for inpatient use)     Chief Complaint  Patient presents with  . Acute Visit    Pressure wound on right toe    HPI:  [name] seen today for an acute visit for Redness of 4 th toe of right foot  Patient has a history of herLEedema, PSVT and Tachy brady syndrome  , GERD, IBS with diarrhea Stayed in the Hospital from 7/26-8/4 for Acute Infarct with Acute Encephalopathy With MRI showed cerebral small acute infarcts in the posterior left hemisphere in the left PCA and MCA watershed area Thought to be Embolic. Discharged on Amiodarone and Eliquis But Amiodarone discontinued due to Prolong QT and Recurent Falls   She is in AL now Was seen by Nurse to have redness and Pain inher 4 th toe in Right foot It is tender to Touch No History of Injury Her Shoes are very tight per nurses  She is doing well otherwise. Walking with her walker No Falls or dizziness   Past  Medical History:  Diagnosis Date  . COVID-19   . 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 No pertinent surgical history.  Allergies  Allergen Reactions  . Azopt [Brinzolamide] Other (See Comments)    UNK  . Brimonidine Other (See Comments)    UNK  . Lumigan [Bimatoprost] Other (See Comments)    UNK    Allergies as of 03/15/2020      Reactions   Azopt [brinzolamide] Other (See Comments)   UNK   Brimonidine Other (See Comments)   UNK   Lumigan [bimatoprost] Other (See Comments)   UNK      Medication List       Accurate as of March 15, 2020 10:34 AM. If you have any questions, ask your nurse or doctor.        STOP taking these medications   collagenase ointment Commonly known as: SANTYL Stopped by: Virgie Dad, MD   [place] by: Virgie Dad, MD     TAKE these medications   acetaminophen 325 MG tablet Commonly known as: TYLENOL Take 650 mg by mouth every 6 (six) hours as needed for mild pain or headache.   apixaban 2.5 MG Tabs tablet Commonly known as: ELIQUIS Take 1 tablet (2.5 mg total) by mouth 2 (two) times daily.   atorvastatin 20 MG tablet Commonly known as: LIPITOR Take 1 tablet (20 mg total) by mouth daily at 6 PM.  b complex vitamins tablet Take 1 tablet by mouth daily.   bismuth subsalicylate 767 HA/19FX suspension Commonly known as: PEPTO BISMOL Take 60 mLs by mouth daily as needed for indigestion or diarrhea or loose stools.   Calcium Carbonate-Vitamin D 600-400 MG-UNIT tablet Take 1 tablet by mouth daily.   CENTRUM SILVER PO Take 1 tablet by mouth daily. 7am-10am.   diphenoxylate-atropine 2.5-0.025 MG tablet Commonly known as: LOMOTIL Take 1 tablet by mouth daily as needed for diarrhea or loose stools.   feeding supplement (PRO-STAT 64) Liqd Take 30 mLs by mouth daily.   lactose free nutrition Liqd Take 237 mLs by mouth  daily.   melatonin 3 MG Tabs tablet Take 3 mg by mouth at bedtime.   metoprolol tartrate 25 MG tablet Commonly known as: LOPRESSOR Take 0.5 tablets (12.5 mg total) by mouth 2 (two) times daily.   NON FORMULARY Moisturizing Cream to extremities with morning and evening care Every Shift   NON FORMULARY Regular Special Instructions: Regular Diet, Thin Liquids   omeprazole 20 MG capsule Commonly known as: PRILOSEC Take 20 mg by mouth daily.   sertraline 25 MG tablet Commonly known as: ZOLOFT Take 0.5 tablets (12.5 mg total) by mouth at bedtime.   timolol 0.5 % ophthalmic solution Commonly known as: BETIMOL Place 1 drop into both eyes daily. 7am-10am.   zinc oxide 20 % ointment Apply 1 application topically as needed for irritation.       Review of Systems  Review of Systems  Constitutional: Negative for activity change, appetite change, chills, diaphoresis, fatigue and fever.  HENT: Negative for mouth sores, postnasal drip, rhinorrhea, sinus pain and sore throat.   Respiratory: Negative for apnea, cough, chest tightness, shortness of breath and wheezing.   Cardiovascular: Negative for chest pain, palpitations and leg swelling.  Gastrointestinal: Negative for abdominal distention, abdominal pain, constipation, diarrhea, nausea and vomiting.  Genitourinary: Negative for dysuria and frequency.  Musculoskeletal: Negative for arthralgias, joint swelling and myalgias.  Skin: Negative for rash.  Neurological: Negative for dizziness, syncope, weakness, light-headedness and numbness.  Psychiatric/Behavioral: Negative for behavioral problems, confusion and sleep disturbance.     Immunization History  Administered Date(s) Administered  . Influenza, High Dose Seasonal PF 11/08/2018  . Influenza-Unspecified 11/07/2013, 01/14/2020  . Moderna SARS-COVID-2 Vaccination 05/09/2019, 06/06/2019  . Pneumococcal-Unspecified 10/18/2014   Pertinent  Health Maintenance Due  Topic Date  Due  . PNA vac Low Risk Adult (2 of 2 - PCV13) 10/18/2015  . INFLUENZA VACCINE  Completed  . DEXA SCAN  Completed   Fall Risk  05/19/2019 05/14/2019  Falls in the past year? 0 0  Number falls in past yr: 0 0  Injury with Fall? 0 -   Functional Status Survey:    Vitals:   03/15/20 1027  BP: 136/66  Pulse: 90  Resp: 20  Temp: 98.1 F (36.7 C)  SpO2: 96%  Weight: 98 lb 6.4 oz (44.6 kg)  Height: 4\' 5"  (1.346 m)   Body mass index is 24.63 kg/m. Physical Exam  Constitutional: Oriented to person, place, and time. Well-developed and well-nourished.  HENT:  Head: Normocephalic.  Mouth/Throat: Oropharynx is clear and moist.  Eyes: Pupils are equal, round, and reactive to light.  Neck: Neck supple.  Cardiovascular: Normal rate and normal heart sounds.  No murmur heard.Irregular  Pulmonary/Chest: Effort normal and breath sounds normal. No respiratory distress. No wheezes. She has no rales.  Abdominal: Soft. Bowel sounds are normal. No distension. There is no tenderness. There is  no rebound.  Musculoskeletal:Mild edema Bilateral Has Pressure Ulcer on top of her Toe which is tender. No Signs of infection Lymphadenopathy: none Neurological: Alert and oriented to person, place, and time.  Skin: Skin is warm and dry.  Psychiatric: Normal mood and affect. Behavior is normal. Thought content normal.    Labs reviewed: Recent Labs    11/03/19 0454 11/04/19 0535 01/09/20 1820 01/09/20 1820 01/09/20 2238 01/10/20 0448 01/11/20 0154 01/21/20 0000  NA 143   < > 138   < >  --  142 142 143  K 4.1   < > 3.3*   < >  --  3.6 4.0 4.2  CL 103   < > 107   < >  --  110 109 109*  CO2 22   < > 22   < >  --  24 28 26*  GLUCOSE 76   < > 101*  --   --  86 91  --   BUN 32*   < > 20   < >  --  14 15 17   CREATININE 1.02*   < > 0.81   < >  --  0.69 0.84 0.8  CALCIUM 8.5*   < > 8.1*   < >  --  8.2* 8.0* 8.1*  MG 2.3  --   --   --  1.9 2.0  --   --    < > = values in this interval not displayed.    Recent Labs    11/02/19 1045 11/02/19 1045 11/03/19 0454 11/03/19 0454 11/06/19 0544 11/19/19 0000 01/21/20 0000  AST 34   < > 32   < > 34 24 22  ALT 19   < > 18   < > 18 18 19   ALKPHOS 64   < > 57   < > 45 64 73  BILITOT 0.8  --  0.9  --  0.3  --   --   PROT 6.9  --  6.1*  --  5.1*  --   --   ALBUMIN 4.0   < > 3.5   < > 2.9* 3.7 3.1*   < > = values in this interval not displayed.   Recent Labs    10/26/19 0000 11/02/19 1045 01/09/20 1820 01/09/20 1820 01/10/20 0448 01/11/20 0154 01/21/20 0000  WBC 7.8   < > 6.9   < > 7.6 7.5 6.0  NEUTROABS 5,936  --  5.1  --   --  5.6  --   HGB 13.9   < > 11.0*   < > 10.9* 10.4* 10.7*  HCT 42   < > 34.4*   < > 34.8* 32.9* 33*  MCV  --    < > 99.7  --  99.7 99.7  --   PLT 251   < > 305   < > 287 281 261   < > = values in this interval not displayed.   Lab Results  Component Value Date   TSH 4.060 01/11/2020   Lab Results  Component Value Date   HGBA1C 5.9 (H) 11/04/2019   Lab Results  Component Value Date   CHOL 138 11/06/2019   HDL 49 11/06/2019   LDLCALC 72 11/06/2019   TRIG 86 11/06/2019   CHOLHDL 2.8 11/06/2019    Significant Diagnostic Results in last 30 days:  No results found.  Assessment/Plan  Pressure injury of toe of right foot, stage 2 (HCC) Skin prep covered with Foam dressing  Talk to POA for new shoes Paroxysmal SVT (supraventricular tachycardia) (HCC) On Low dose of Metoprolol Also on Eliquis Did not tolerate Amiodarone due to Prolong QT  Cerebrovascular accident (CVA), unspecified mechanism ([place]) On Eliquis and Lipitor  Gastroesophageal reflux disease, unspecified whether esophagitis present On omeprazole Depression, recurrent ([place]) On Zoloft Memory deficit Noticed Cognitive changes since her stroke few months ago Continue on Eliquis and statin  Edema, unspecified type Ted hoses   Family/ staff Communication:   Labs/tests ordered:

## 2020-03-24 DIAGNOSIS — Z9181 History of falling: Secondary | ICD-10-CM | POA: Diagnosis not present

## 2020-03-24 DIAGNOSIS — M6281 Muscle weakness (generalized): Secondary | ICD-10-CM | POA: Diagnosis not present

## 2020-03-24 DIAGNOSIS — R41841 Cognitive communication deficit: Secondary | ICD-10-CM | POA: Diagnosis not present

## 2020-03-24 DIAGNOSIS — R2681 Unsteadiness on feet: Secondary | ICD-10-CM | POA: Diagnosis not present

## 2020-03-24 DIAGNOSIS — N3946 Mixed incontinence: Secondary | ICD-10-CM | POA: Diagnosis not present

## 2020-03-27 DIAGNOSIS — N3946 Mixed incontinence: Secondary | ICD-10-CM | POA: Diagnosis not present

## 2020-03-27 DIAGNOSIS — R2681 Unsteadiness on feet: Secondary | ICD-10-CM | POA: Diagnosis not present

## 2020-03-27 DIAGNOSIS — M6281 Muscle weakness (generalized): Secondary | ICD-10-CM | POA: Diagnosis not present

## 2020-03-27 DIAGNOSIS — Z9181 History of falling: Secondary | ICD-10-CM | POA: Diagnosis not present

## 2020-03-27 DIAGNOSIS — R41841 Cognitive communication deficit: Secondary | ICD-10-CM | POA: Diagnosis not present

## 2020-03-28 DIAGNOSIS — Z9181 History of falling: Secondary | ICD-10-CM | POA: Diagnosis not present

## 2020-03-28 DIAGNOSIS — R2681 Unsteadiness on feet: Secondary | ICD-10-CM | POA: Diagnosis not present

## 2020-03-28 DIAGNOSIS — M6281 Muscle weakness (generalized): Secondary | ICD-10-CM | POA: Diagnosis not present

## 2020-03-28 DIAGNOSIS — R41841 Cognitive communication deficit: Secondary | ICD-10-CM | POA: Diagnosis not present

## 2020-03-28 DIAGNOSIS — N3946 Mixed incontinence: Secondary | ICD-10-CM | POA: Diagnosis not present

## 2020-03-31 DIAGNOSIS — Z9181 History of falling: Secondary | ICD-10-CM | POA: Diagnosis not present

## 2020-03-31 DIAGNOSIS — R41841 Cognitive communication deficit: Secondary | ICD-10-CM | POA: Diagnosis not present

## 2020-03-31 DIAGNOSIS — N3946 Mixed incontinence: Secondary | ICD-10-CM | POA: Diagnosis not present

## 2020-03-31 DIAGNOSIS — R2681 Unsteadiness on feet: Secondary | ICD-10-CM | POA: Diagnosis not present

## 2020-03-31 DIAGNOSIS — M6281 Muscle weakness (generalized): Secondary | ICD-10-CM | POA: Diagnosis not present

## 2020-04-06 DIAGNOSIS — Z9181 History of falling: Secondary | ICD-10-CM | POA: Diagnosis not present

## 2020-04-06 DIAGNOSIS — N3946 Mixed incontinence: Secondary | ICD-10-CM | POA: Diagnosis not present

## 2020-04-06 DIAGNOSIS — R41841 Cognitive communication deficit: Secondary | ICD-10-CM | POA: Diagnosis not present

## 2020-04-06 DIAGNOSIS — R2681 Unsteadiness on feet: Secondary | ICD-10-CM | POA: Diagnosis not present

## 2020-04-06 DIAGNOSIS — M6281 Muscle weakness (generalized): Secondary | ICD-10-CM | POA: Diagnosis not present

## 2020-04-07 DIAGNOSIS — M6281 Muscle weakness (generalized): Secondary | ICD-10-CM | POA: Diagnosis not present

## 2020-04-07 DIAGNOSIS — N3946 Mixed incontinence: Secondary | ICD-10-CM | POA: Diagnosis not present

## 2020-04-07 DIAGNOSIS — R2681 Unsteadiness on feet: Secondary | ICD-10-CM | POA: Diagnosis not present

## 2020-04-07 DIAGNOSIS — R41841 Cognitive communication deficit: Secondary | ICD-10-CM | POA: Diagnosis not present

## 2020-04-07 DIAGNOSIS — Z9181 History of falling: Secondary | ICD-10-CM | POA: Diagnosis not present

## 2020-04-08 DIAGNOSIS — R41841 Cognitive communication deficit: Secondary | ICD-10-CM | POA: Diagnosis not present

## 2020-04-08 DIAGNOSIS — N3946 Mixed incontinence: Secondary | ICD-10-CM | POA: Diagnosis not present

## 2020-04-08 DIAGNOSIS — R2681 Unsteadiness on feet: Secondary | ICD-10-CM | POA: Diagnosis not present

## 2020-04-08 DIAGNOSIS — Z9181 History of falling: Secondary | ICD-10-CM | POA: Diagnosis not present

## 2020-04-08 DIAGNOSIS — M6281 Muscle weakness (generalized): Secondary | ICD-10-CM | POA: Diagnosis not present

## 2020-04-12 DIAGNOSIS — R41841 Cognitive communication deficit: Secondary | ICD-10-CM | POA: Diagnosis not present

## 2020-04-12 DIAGNOSIS — N3946 Mixed incontinence: Secondary | ICD-10-CM | POA: Diagnosis not present

## 2020-04-12 DIAGNOSIS — M6281 Muscle weakness (generalized): Secondary | ICD-10-CM | POA: Diagnosis not present

## 2020-04-12 DIAGNOSIS — Z9181 History of falling: Secondary | ICD-10-CM | POA: Diagnosis not present

## 2020-04-12 DIAGNOSIS — R2681 Unsteadiness on feet: Secondary | ICD-10-CM | POA: Diagnosis not present

## 2020-04-13 DIAGNOSIS — R41841 Cognitive communication deficit: Secondary | ICD-10-CM | POA: Diagnosis not present

## 2020-04-13 DIAGNOSIS — Z9181 History of falling: Secondary | ICD-10-CM | POA: Diagnosis not present

## 2020-04-13 DIAGNOSIS — M6281 Muscle weakness (generalized): Secondary | ICD-10-CM | POA: Diagnosis not present

## 2020-04-13 DIAGNOSIS — N3946 Mixed incontinence: Secondary | ICD-10-CM | POA: Diagnosis not present

## 2020-04-13 DIAGNOSIS — R2681 Unsteadiness on feet: Secondary | ICD-10-CM | POA: Diagnosis not present

## 2020-04-14 DIAGNOSIS — R41841 Cognitive communication deficit: Secondary | ICD-10-CM | POA: Diagnosis not present

## 2020-04-14 DIAGNOSIS — R2681 Unsteadiness on feet: Secondary | ICD-10-CM | POA: Diagnosis not present

## 2020-04-14 DIAGNOSIS — Z9181 History of falling: Secondary | ICD-10-CM | POA: Diagnosis not present

## 2020-04-14 DIAGNOSIS — M6281 Muscle weakness (generalized): Secondary | ICD-10-CM | POA: Diagnosis not present

## 2020-04-14 DIAGNOSIS — N3946 Mixed incontinence: Secondary | ICD-10-CM | POA: Diagnosis not present

## 2020-04-19 DIAGNOSIS — M6281 Muscle weakness (generalized): Secondary | ICD-10-CM | POA: Diagnosis not present

## 2020-04-19 DIAGNOSIS — Z9181 History of falling: Secondary | ICD-10-CM | POA: Diagnosis not present

## 2020-04-19 DIAGNOSIS — R2681 Unsteadiness on feet: Secondary | ICD-10-CM | POA: Diagnosis not present

## 2020-04-19 DIAGNOSIS — R41841 Cognitive communication deficit: Secondary | ICD-10-CM | POA: Diagnosis not present

## 2020-04-19 DIAGNOSIS — N3946 Mixed incontinence: Secondary | ICD-10-CM | POA: Diagnosis not present

## 2020-04-20 DIAGNOSIS — Z9181 History of falling: Secondary | ICD-10-CM | POA: Diagnosis not present

## 2020-04-20 DIAGNOSIS — R41841 Cognitive communication deficit: Secondary | ICD-10-CM | POA: Diagnosis not present

## 2020-04-20 DIAGNOSIS — R2681 Unsteadiness on feet: Secondary | ICD-10-CM | POA: Diagnosis not present

## 2020-04-20 DIAGNOSIS — N3946 Mixed incontinence: Secondary | ICD-10-CM | POA: Diagnosis not present

## 2020-04-20 DIAGNOSIS — M6281 Muscle weakness (generalized): Secondary | ICD-10-CM | POA: Diagnosis not present

## 2020-04-21 DIAGNOSIS — M6281 Muscle weakness (generalized): Secondary | ICD-10-CM | POA: Diagnosis not present

## 2020-04-21 DIAGNOSIS — R2681 Unsteadiness on feet: Secondary | ICD-10-CM | POA: Diagnosis not present

## 2020-04-21 DIAGNOSIS — R41841 Cognitive communication deficit: Secondary | ICD-10-CM | POA: Diagnosis not present

## 2020-04-21 DIAGNOSIS — Z9181 History of falling: Secondary | ICD-10-CM | POA: Diagnosis not present

## 2020-04-21 DIAGNOSIS — N3946 Mixed incontinence: Secondary | ICD-10-CM | POA: Diagnosis not present

## 2020-04-23 DIAGNOSIS — R41841 Cognitive communication deficit: Secondary | ICD-10-CM | POA: Diagnosis not present

## 2020-04-23 DIAGNOSIS — M6281 Muscle weakness (generalized): Secondary | ICD-10-CM | POA: Diagnosis not present

## 2020-04-23 DIAGNOSIS — Z9181 History of falling: Secondary | ICD-10-CM | POA: Diagnosis not present

## 2020-04-23 DIAGNOSIS — N3946 Mixed incontinence: Secondary | ICD-10-CM | POA: Diagnosis not present

## 2020-04-23 DIAGNOSIS — R2681 Unsteadiness on feet: Secondary | ICD-10-CM | POA: Diagnosis not present

## 2020-04-25 DIAGNOSIS — R41841 Cognitive communication deficit: Secondary | ICD-10-CM | POA: Diagnosis not present

## 2020-04-25 DIAGNOSIS — Z9181 History of falling: Secondary | ICD-10-CM | POA: Diagnosis not present

## 2020-04-25 DIAGNOSIS — R2681 Unsteadiness on feet: Secondary | ICD-10-CM | POA: Diagnosis not present

## 2020-04-25 DIAGNOSIS — N3946 Mixed incontinence: Secondary | ICD-10-CM | POA: Diagnosis not present

## 2020-04-25 DIAGNOSIS — M6281 Muscle weakness (generalized): Secondary | ICD-10-CM | POA: Diagnosis not present

## 2020-04-26 DIAGNOSIS — M6281 Muscle weakness (generalized): Secondary | ICD-10-CM | POA: Diagnosis not present

## 2020-04-26 DIAGNOSIS — Z9181 History of falling: Secondary | ICD-10-CM | POA: Diagnosis not present

## 2020-04-26 DIAGNOSIS — R41841 Cognitive communication deficit: Secondary | ICD-10-CM | POA: Diagnosis not present

## 2020-04-26 DIAGNOSIS — N3946 Mixed incontinence: Secondary | ICD-10-CM | POA: Diagnosis not present

## 2020-04-26 DIAGNOSIS — R2681 Unsteadiness on feet: Secondary | ICD-10-CM | POA: Diagnosis not present

## 2020-04-27 DIAGNOSIS — Z9181 History of falling: Secondary | ICD-10-CM | POA: Diagnosis not present

## 2020-04-27 DIAGNOSIS — M6281 Muscle weakness (generalized): Secondary | ICD-10-CM | POA: Diagnosis not present

## 2020-04-27 DIAGNOSIS — R41841 Cognitive communication deficit: Secondary | ICD-10-CM | POA: Diagnosis not present

## 2020-04-27 DIAGNOSIS — N3946 Mixed incontinence: Secondary | ICD-10-CM | POA: Diagnosis not present

## 2020-04-27 DIAGNOSIS — R2681 Unsteadiness on feet: Secondary | ICD-10-CM | POA: Diagnosis not present

## 2020-04-28 DIAGNOSIS — Z9181 History of falling: Secondary | ICD-10-CM | POA: Diagnosis not present

## 2020-04-28 DIAGNOSIS — M6281 Muscle weakness (generalized): Secondary | ICD-10-CM | POA: Diagnosis not present

## 2020-04-28 DIAGNOSIS — N3946 Mixed incontinence: Secondary | ICD-10-CM | POA: Diagnosis not present

## 2020-04-28 DIAGNOSIS — R2681 Unsteadiness on feet: Secondary | ICD-10-CM | POA: Diagnosis not present

## 2020-04-28 DIAGNOSIS — R41841 Cognitive communication deficit: Secondary | ICD-10-CM | POA: Diagnosis not present

## 2020-05-02 DIAGNOSIS — N3946 Mixed incontinence: Secondary | ICD-10-CM | POA: Diagnosis not present

## 2020-05-02 DIAGNOSIS — M6281 Muscle weakness (generalized): Secondary | ICD-10-CM | POA: Diagnosis not present

## 2020-05-02 DIAGNOSIS — R41841 Cognitive communication deficit: Secondary | ICD-10-CM | POA: Diagnosis not present

## 2020-05-02 DIAGNOSIS — R2681 Unsteadiness on feet: Secondary | ICD-10-CM | POA: Diagnosis not present

## 2020-05-02 DIAGNOSIS — Z9181 History of falling: Secondary | ICD-10-CM | POA: Diagnosis not present

## 2020-05-03 DIAGNOSIS — R2681 Unsteadiness on feet: Secondary | ICD-10-CM | POA: Diagnosis not present

## 2020-05-03 DIAGNOSIS — R41841 Cognitive communication deficit: Secondary | ICD-10-CM | POA: Diagnosis not present

## 2020-05-03 DIAGNOSIS — N3946 Mixed incontinence: Secondary | ICD-10-CM | POA: Diagnosis not present

## 2020-05-03 DIAGNOSIS — Z9181 History of falling: Secondary | ICD-10-CM | POA: Diagnosis not present

## 2020-05-03 DIAGNOSIS — M6281 Muscle weakness (generalized): Secondary | ICD-10-CM | POA: Diagnosis not present

## 2020-05-04 DIAGNOSIS — R41841 Cognitive communication deficit: Secondary | ICD-10-CM | POA: Diagnosis not present

## 2020-05-04 DIAGNOSIS — Z9181 History of falling: Secondary | ICD-10-CM | POA: Diagnosis not present

## 2020-05-04 DIAGNOSIS — R2681 Unsteadiness on feet: Secondary | ICD-10-CM | POA: Diagnosis not present

## 2020-05-04 DIAGNOSIS — M6281 Muscle weakness (generalized): Secondary | ICD-10-CM | POA: Diagnosis not present

## 2020-05-04 DIAGNOSIS — N3946 Mixed incontinence: Secondary | ICD-10-CM | POA: Diagnosis not present

## 2020-05-05 DIAGNOSIS — R2681 Unsteadiness on feet: Secondary | ICD-10-CM | POA: Diagnosis not present

## 2020-05-05 DIAGNOSIS — M6281 Muscle weakness (generalized): Secondary | ICD-10-CM | POA: Diagnosis not present

## 2020-05-05 DIAGNOSIS — N3946 Mixed incontinence: Secondary | ICD-10-CM | POA: Diagnosis not present

## 2020-05-05 DIAGNOSIS — Z9181 History of falling: Secondary | ICD-10-CM | POA: Diagnosis not present

## 2020-05-05 DIAGNOSIS — R41841 Cognitive communication deficit: Secondary | ICD-10-CM | POA: Diagnosis not present

## 2020-05-06 DIAGNOSIS — M6281 Muscle weakness (generalized): Secondary | ICD-10-CM | POA: Diagnosis not present

## 2020-05-06 DIAGNOSIS — Z9181 History of falling: Secondary | ICD-10-CM | POA: Diagnosis not present

## 2020-05-06 DIAGNOSIS — R2681 Unsteadiness on feet: Secondary | ICD-10-CM | POA: Diagnosis not present

## 2020-05-06 DIAGNOSIS — N3946 Mixed incontinence: Secondary | ICD-10-CM | POA: Diagnosis not present

## 2020-05-06 DIAGNOSIS — R41841 Cognitive communication deficit: Secondary | ICD-10-CM | POA: Diagnosis not present

## 2020-05-09 DIAGNOSIS — M6281 Muscle weakness (generalized): Secondary | ICD-10-CM | POA: Diagnosis not present

## 2020-05-09 DIAGNOSIS — R2681 Unsteadiness on feet: Secondary | ICD-10-CM | POA: Diagnosis not present

## 2020-05-09 DIAGNOSIS — Z9181 History of falling: Secondary | ICD-10-CM | POA: Diagnosis not present

## 2020-05-09 DIAGNOSIS — R41841 Cognitive communication deficit: Secondary | ICD-10-CM | POA: Diagnosis not present

## 2020-05-09 DIAGNOSIS — N3946 Mixed incontinence: Secondary | ICD-10-CM | POA: Diagnosis not present

## 2020-05-10 DIAGNOSIS — R41841 Cognitive communication deficit: Secondary | ICD-10-CM | POA: Diagnosis not present

## 2020-05-10 DIAGNOSIS — Z9181 History of falling: Secondary | ICD-10-CM | POA: Diagnosis not present

## 2020-05-10 DIAGNOSIS — N3946 Mixed incontinence: Secondary | ICD-10-CM | POA: Diagnosis not present

## 2020-05-10 DIAGNOSIS — R2681 Unsteadiness on feet: Secondary | ICD-10-CM | POA: Diagnosis not present

## 2020-05-10 DIAGNOSIS — M6281 Muscle weakness (generalized): Secondary | ICD-10-CM | POA: Diagnosis not present

## 2020-05-11 DIAGNOSIS — R2681 Unsteadiness on feet: Secondary | ICD-10-CM | POA: Diagnosis not present

## 2020-05-11 DIAGNOSIS — M6281 Muscle weakness (generalized): Secondary | ICD-10-CM | POA: Diagnosis not present

## 2020-05-11 DIAGNOSIS — Z9181 History of falling: Secondary | ICD-10-CM | POA: Diagnosis not present

## 2020-05-11 DIAGNOSIS — N3946 Mixed incontinence: Secondary | ICD-10-CM | POA: Diagnosis not present

## 2020-05-11 DIAGNOSIS — R41841 Cognitive communication deficit: Secondary | ICD-10-CM | POA: Diagnosis not present

## 2020-05-13 DIAGNOSIS — N3946 Mixed incontinence: Secondary | ICD-10-CM | POA: Diagnosis not present

## 2020-05-13 DIAGNOSIS — R2681 Unsteadiness on feet: Secondary | ICD-10-CM | POA: Diagnosis not present

## 2020-05-13 DIAGNOSIS — M6281 Muscle weakness (generalized): Secondary | ICD-10-CM | POA: Diagnosis not present

## 2020-05-13 DIAGNOSIS — Z9181 History of falling: Secondary | ICD-10-CM | POA: Diagnosis not present

## 2020-05-13 DIAGNOSIS — R41841 Cognitive communication deficit: Secondary | ICD-10-CM | POA: Diagnosis not present

## 2020-05-16 DIAGNOSIS — N3946 Mixed incontinence: Secondary | ICD-10-CM | POA: Diagnosis not present

## 2020-05-16 DIAGNOSIS — Z9181 History of falling: Secondary | ICD-10-CM | POA: Diagnosis not present

## 2020-05-16 DIAGNOSIS — M6281 Muscle weakness (generalized): Secondary | ICD-10-CM | POA: Diagnosis not present

## 2020-05-16 DIAGNOSIS — R2681 Unsteadiness on feet: Secondary | ICD-10-CM | POA: Diagnosis not present

## 2020-05-16 DIAGNOSIS — R41841 Cognitive communication deficit: Secondary | ICD-10-CM | POA: Diagnosis not present

## 2020-05-17 DIAGNOSIS — R41841 Cognitive communication deficit: Secondary | ICD-10-CM | POA: Diagnosis not present

## 2020-05-17 DIAGNOSIS — N3946 Mixed incontinence: Secondary | ICD-10-CM | POA: Diagnosis not present

## 2020-05-17 DIAGNOSIS — M6281 Muscle weakness (generalized): Secondary | ICD-10-CM | POA: Diagnosis not present

## 2020-05-17 DIAGNOSIS — R2681 Unsteadiness on feet: Secondary | ICD-10-CM | POA: Diagnosis not present

## 2020-05-17 DIAGNOSIS — Z9181 History of falling: Secondary | ICD-10-CM | POA: Diagnosis not present

## 2020-05-18 DIAGNOSIS — N3946 Mixed incontinence: Secondary | ICD-10-CM | POA: Diagnosis not present

## 2020-05-18 DIAGNOSIS — R2681 Unsteadiness on feet: Secondary | ICD-10-CM | POA: Diagnosis not present

## 2020-05-18 DIAGNOSIS — R41841 Cognitive communication deficit: Secondary | ICD-10-CM | POA: Diagnosis not present

## 2020-05-18 DIAGNOSIS — M6281 Muscle weakness (generalized): Secondary | ICD-10-CM | POA: Diagnosis not present

## 2020-05-18 DIAGNOSIS — Z9181 History of falling: Secondary | ICD-10-CM | POA: Diagnosis not present

## 2020-05-19 DIAGNOSIS — M6281 Muscle weakness (generalized): Secondary | ICD-10-CM | POA: Diagnosis not present

## 2020-05-19 DIAGNOSIS — Z9181 History of falling: Secondary | ICD-10-CM | POA: Diagnosis not present

## 2020-05-19 DIAGNOSIS — R41841 Cognitive communication deficit: Secondary | ICD-10-CM | POA: Diagnosis not present

## 2020-05-19 DIAGNOSIS — R2681 Unsteadiness on feet: Secondary | ICD-10-CM | POA: Diagnosis not present

## 2020-05-19 DIAGNOSIS — N3946 Mixed incontinence: Secondary | ICD-10-CM | POA: Diagnosis not present

## 2020-05-20 DIAGNOSIS — M6281 Muscle weakness (generalized): Secondary | ICD-10-CM | POA: Diagnosis not present

## 2020-05-20 DIAGNOSIS — R2681 Unsteadiness on feet: Secondary | ICD-10-CM | POA: Diagnosis not present

## 2020-05-20 DIAGNOSIS — Z9181 History of falling: Secondary | ICD-10-CM | POA: Diagnosis not present

## 2020-05-20 DIAGNOSIS — N3946 Mixed incontinence: Secondary | ICD-10-CM | POA: Diagnosis not present

## 2020-05-20 DIAGNOSIS — R41841 Cognitive communication deficit: Secondary | ICD-10-CM | POA: Diagnosis not present

## 2020-05-23 DIAGNOSIS — N3946 Mixed incontinence: Secondary | ICD-10-CM | POA: Diagnosis not present

## 2020-05-23 DIAGNOSIS — R41841 Cognitive communication deficit: Secondary | ICD-10-CM | POA: Diagnosis not present

## 2020-05-23 DIAGNOSIS — M6281 Muscle weakness (generalized): Secondary | ICD-10-CM | POA: Diagnosis not present

## 2020-05-23 DIAGNOSIS — R2681 Unsteadiness on feet: Secondary | ICD-10-CM | POA: Diagnosis not present

## 2020-05-23 DIAGNOSIS — Z9181 History of falling: Secondary | ICD-10-CM | POA: Diagnosis not present

## 2020-05-24 DIAGNOSIS — M6281 Muscle weakness (generalized): Secondary | ICD-10-CM | POA: Diagnosis not present

## 2020-05-24 DIAGNOSIS — N3946 Mixed incontinence: Secondary | ICD-10-CM | POA: Diagnosis not present

## 2020-05-24 DIAGNOSIS — R41841 Cognitive communication deficit: Secondary | ICD-10-CM | POA: Diagnosis not present

## 2020-05-24 DIAGNOSIS — R2681 Unsteadiness on feet: Secondary | ICD-10-CM | POA: Diagnosis not present

## 2020-05-24 DIAGNOSIS — Z9181 History of falling: Secondary | ICD-10-CM | POA: Diagnosis not present

## 2020-05-25 DIAGNOSIS — N3946 Mixed incontinence: Secondary | ICD-10-CM | POA: Diagnosis not present

## 2020-05-25 DIAGNOSIS — M6281 Muscle weakness (generalized): Secondary | ICD-10-CM | POA: Diagnosis not present

## 2020-05-25 DIAGNOSIS — R41841 Cognitive communication deficit: Secondary | ICD-10-CM | POA: Diagnosis not present

## 2020-05-25 DIAGNOSIS — R2681 Unsteadiness on feet: Secondary | ICD-10-CM | POA: Diagnosis not present

## 2020-05-25 DIAGNOSIS — Z9181 History of falling: Secondary | ICD-10-CM | POA: Diagnosis not present

## 2020-05-27 DIAGNOSIS — R41841 Cognitive communication deficit: Secondary | ICD-10-CM | POA: Diagnosis not present

## 2020-05-27 DIAGNOSIS — R2681 Unsteadiness on feet: Secondary | ICD-10-CM | POA: Diagnosis not present

## 2020-05-27 DIAGNOSIS — M6281 Muscle weakness (generalized): Secondary | ICD-10-CM | POA: Diagnosis not present

## 2020-05-27 DIAGNOSIS — N3946 Mixed incontinence: Secondary | ICD-10-CM | POA: Diagnosis not present

## 2020-05-27 DIAGNOSIS — Z9181 History of falling: Secondary | ICD-10-CM | POA: Diagnosis not present

## 2020-05-30 DIAGNOSIS — Z9181 History of falling: Secondary | ICD-10-CM | POA: Diagnosis not present

## 2020-05-30 DIAGNOSIS — R41841 Cognitive communication deficit: Secondary | ICD-10-CM | POA: Diagnosis not present

## 2020-05-30 DIAGNOSIS — N3946 Mixed incontinence: Secondary | ICD-10-CM | POA: Diagnosis not present

## 2020-05-30 DIAGNOSIS — M6281 Muscle weakness (generalized): Secondary | ICD-10-CM | POA: Diagnosis not present

## 2020-05-30 DIAGNOSIS — R2681 Unsteadiness on feet: Secondary | ICD-10-CM | POA: Diagnosis not present

## 2020-06-01 DIAGNOSIS — Z9181 History of falling: Secondary | ICD-10-CM | POA: Diagnosis not present

## 2020-06-01 DIAGNOSIS — N3946 Mixed incontinence: Secondary | ICD-10-CM | POA: Diagnosis not present

## 2020-06-01 DIAGNOSIS — M6281 Muscle weakness (generalized): Secondary | ICD-10-CM | POA: Diagnosis not present

## 2020-06-01 DIAGNOSIS — R2681 Unsteadiness on feet: Secondary | ICD-10-CM | POA: Diagnosis not present

## 2020-06-01 DIAGNOSIS — R41841 Cognitive communication deficit: Secondary | ICD-10-CM | POA: Diagnosis not present

## 2020-06-06 DIAGNOSIS — L814 Other melanin hyperpigmentation: Secondary | ICD-10-CM | POA: Diagnosis not present

## 2020-06-06 DIAGNOSIS — D1801 Hemangioma of skin and subcutaneous tissue: Secondary | ICD-10-CM | POA: Diagnosis not present

## 2020-06-06 DIAGNOSIS — L570 Actinic keratosis: Secondary | ICD-10-CM | POA: Diagnosis not present

## 2020-07-06 ENCOUNTER — Encounter: Payer: Self-pay | Admitting: Nurse Practitioner

## 2020-07-06 ENCOUNTER — Non-Acute Institutional Stay: Payer: Medicare Other | Admitting: Nurse Practitioner

## 2020-07-06 DIAGNOSIS — F339 Major depressive disorder, recurrent, unspecified: Secondary | ICD-10-CM | POA: Diagnosis not present

## 2020-07-06 DIAGNOSIS — R609 Edema, unspecified: Secondary | ICD-10-CM

## 2020-07-06 DIAGNOSIS — K219 Gastro-esophageal reflux disease without esophagitis: Secondary | ICD-10-CM

## 2020-07-06 DIAGNOSIS — I639 Cerebral infarction, unspecified: Secondary | ICD-10-CM

## 2020-07-06 DIAGNOSIS — D509 Iron deficiency anemia, unspecified: Secondary | ICD-10-CM

## 2020-07-06 DIAGNOSIS — I471 Supraventricular tachycardia: Secondary | ICD-10-CM | POA: Diagnosis not present

## 2020-07-06 DIAGNOSIS — R413 Other amnesia: Secondary | ICD-10-CM

## 2020-07-06 DIAGNOSIS — R635 Abnormal weight gain: Secondary | ICD-10-CM | POA: Insufficient documentation

## 2020-07-06 NOTE — Assessment & Plan Note (Signed)
Her mood is stable, continue Sertraline.  

## 2020-07-06 NOTE — Assessment & Plan Note (Signed)
Stable, continue Omeprazole.  

## 2020-07-06 NOTE — Progress Notes (Signed)
Location[facility]   [facility] Room Number: 017 Place of Service:  ALF (13) Provider: Lennie Odor Evette Diclemente NP  Deontae Robson X, NP  Patient Care Team: Hilda Rynders X, NP as PCP - General (Internal Medicine)  Extended Emergency Contact Information Primary Emergency Contact: [name] Mobile Phone: [phone] Relation: Friend Secondary Emergency Contact: [name] Mobile Phone: [phone] Relation: Nephew  Code Status: DNR Goals of care: Advanced Directive information Advanced Directives 01/14/2020  Does Patient Have a Medical Advance Directive? Yes  Type of Advance Directive Living will;Out of facility DNR (pink MOST or yellow form);Healthcare Power of Attorney  Does patient want to make changes to medical advance directive? No - Patient declined  Copy of [place] in Chart? Yes - validated most recent copy scanned in chart (See row information)  Pre-existing out of facility DNR order (yellow form or pink MOST form) Yellow form placed in chart (order not valid for inpatient use)     Chief Complaint  Patient presents with  . Acute Visit    tachycardia    HPI:  Pt is a 87 y.o. female seen today for an acute visit for reported episodic tachycardia 120s, but mostly HR 60s, 58bpm AP upon my examination today. The patient denied chest pain, palpitation, SOB, dizziness, change of vision, or focal weakness.              Edema,trace offTorsemide.  PSVToff Amiodarone10/2021 in hospital due to prolonged QT interval/syncope/fall due, started Metoprolol, continuedEliquis.  GERD, takes Omeprazole.  Depression, takes Sertraline. Vascular dementia since CVA, no behavioral issues.              Hospital stay 11/02/19-11/11/19 for acute CVA (MRI cevebral small acute infarcts in the posterior left hemisphere in the left PCA and MCA watershed area.  Anemia, 01/21/20 Iron 44, ferritin 63, Vit B12 695, Hgb  10.7  Past Medical History:  Diagnosis Date  . COVID-19   . 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 No pertinent surgical history.  Allergies  Allergen Reactions  . Azopt [Brinzolamide] Other (See Comments)    UNK  . Brimonidine Other (See Comments)    UNK  . Lumigan [Bimatoprost] Other (See Comments)    UNK    Allergies as of 07/06/2020      Reactions   Azopt [brinzolamide] Other (See Comments)   UNK   Brimonidine Other (See Comments)   UNK   Lumigan [bimatoprost] Other (See Comments)   UNK      Medication List       Accurate as of July 06, 2020 11:33 AM. If you have any questions, ask your nurse or doctor.        acetaminophen 325 MG tablet Commonly known as: TYLENOL Take 650 mg by mouth every 6 (six) hours as needed for mild pain or headache.   apixaban 2.5 MG Tabs tablet Commonly known as: ELIQUIS Take 1 tablet (2.5 mg total) by mouth 2 (two) times daily.   atorvastatin 20 MG tablet Commonly known as: LIPITOR Take 1 tablet (20 mg total) by mouth daily at 6 PM.   b complex vitamins tablet Take 1 tablet by mouth daily.   bismuth subsalicylate 570 VX/79TJ suspension Commonly known as: PEPTO BISMOL Take 60 mLs by mouth daily as needed for indigestion or diarrhea or loose stools.   Calcium Carbonate-Vitamin D 600-400 MG-UNIT tablet Take 1 tablet by mouth daily.   CENTRUM SILVER PO  Take 1 tablet by mouth daily. 7am-10am.   diphenoxylate-atropine 2.5-0.025 MG tablet Commonly known as: LOMOTIL Take 1 tablet by mouth daily as needed for diarrhea or loose stools.   feeding supplement (PRO-STAT 64) Liqd Take 30 mLs by mouth daily.   lactose free nutrition Liqd Take 237 mLs by mouth daily.   melatonin 3 MG Tabs tablet Take 3 mg by mouth at bedtime.   metoprolol tartrate 25 MG tablet Commonly known as: LOPRESSOR Take 0.5 tablets (12.5 mg total) by mouth 2 (two) times  daily.   NON FORMULARY Moisturizing Cream to extremities with morning and evening care Every Shift   NON FORMULARY Regular Special Instructions: Regular Diet, Thin Liquids   omeprazole 20 MG capsule Commonly known as: PRILOSEC Take 20 mg by mouth daily.   sertraline 25 MG tablet Commonly known as: ZOLOFT Take 0.5 tablets (12.5 mg total) by mouth at bedtime.   timolol 0.5 % ophthalmic solution Commonly known as: BETIMOL Place 1 drop into both eyes daily. 7am-10am.   zinc oxide 20 % ointment Apply 1 application topically as needed for irritation.       Review of Systems  Constitutional: Positive for unexpected weight change. Negative for fatigue and fever.       Weight gained #10Ibs in the past 3-4 months.   HENT: Positive for hearing loss. Negative for congestion, trouble swallowing and voice change.   Eyes: Negative for visual disturbance.  Respiratory: Negative for cough, chest tightness, shortness of breath and wheezing.   Cardiovascular: Negative for chest pain, palpitations and leg swelling.  Gastrointestinal: Negative for abdominal pain and constipation.  Genitourinary: Positive for frequency. Negative for dysuria and urgency.  Musculoskeletal: Positive for gait problem.  Skin: Negative for color change.  Neurological: Negative for speech difficulty, weakness, light-headedness and headaches.  Psychiatric/Behavioral: Positive for confusion. Negative for behavioral problems and sleep disturbance.    Immunization History  Administered Date(s) Administered  . Influenza, High Dose Seasonal PF 11/08/2018  . Influenza-Unspecified 11/07/2013, 01/14/2020  . Moderna Sars-Covid-2 Vaccination 05/09/2019, 06/06/2019  . Pneumococcal-Unspecified 10/18/2014   Pertinent  Health Maintenance Due  Topic Date Due  . PNA vac Low Risk Adult (2 of 2 - PCV13) 10/18/2015  . INFLUENZA VACCINE  Completed  . DEXA SCAN  Completed   Fall Risk  05/19/2019 05/14/2019  Falls in the past year?  0 0  Number falls in past yr: 0 0  Injury with Fall? 0 -   Functional Status Survey:    Vitals:   07/06/20 1114  BP: 114/80  Pulse: (!) 58  Resp: 18  Temp: (!) 97.3 F (36.3 C)  SpO2: 96%  Weight: 108 lb (49 kg)   Body mass index is 27.03 kg/m. Physical Exam Vitals and nursing note reviewed.  Constitutional:      Appearance: Normal appearance.  HENT:     Head: Normocephalic and atraumatic.     Mouth/Throat:     Mouth: Mucous membranes are moist.  Eyes:     Extraocular Movements: Extraocular movements intact.     Conjunctiva/sclera: Conjunctivae normal.     Pupils: Pupils are equal, round, and reactive to light.  Cardiovascular:     Rate and Rhythm: Normal rate and regular rhythm.     Heart sounds: No murmur heard.   Pulmonary:     Effort: Pulmonary effort is normal.     Breath sounds: No wheezing, rhonchi or rales.  Abdominal:     General: Bowel sounds are normal.  Palpations: Abdomen is soft.     Tenderness: There is no abdominal tenderness.  Musculoskeletal:     Cervical back: Normal range of motion and neck supple.     Right lower leg: No edema.     Left lower leg: No edema.  Skin:    General: Skin is warm and dry.  Neurological:     General: No focal deficit present.     Mental Status: She is alert and oriented to person, place, and time. Mental status is at baseline.     Motor: No weakness.     Coordination: Coordination normal.     Gait: Gait abnormal.  Psychiatric:        Mood and Affect: Mood normal.        Behavior: Behavior normal.        Thought Content: Thought content normal.     Labs reviewed: Recent Labs    11/03/19 0454 11/04/19 0535 01/09/20 1820 01/09/20 2238 01/10/20 0448 01/11/20 0154 01/21/20 0000  NA 143   < > 138  --  142 142 143  K 4.1   < > 3.3*  --  3.6 4.0 4.2  CL 103   < > 107  --  110 109 109*  CO2 22   < > 22  --  24 28 26*  GLUCOSE 76   < > 101*  --  86 91  --   BUN 32*   < > 20  --  '14 15 17  ' CREATININE  1.02*   < > 0.81  --  0.69 0.84 0.8  CALCIUM 8.5*   < > 8.1*  --  8.2* 8.0* 8.1*  MG 2.3  --   --  1.9 2.0  --   --    < > = values in this interval not displayed.   Recent Labs    11/02/19 1045 11/03/19 0454 11/06/19 0544 11/19/19 0000 01/21/20 0000  AST 34 32 34 24 22  ALT '19 18 18 18 19  ' ALKPHOS 64 57 45 64 73  BILITOT 0.8 0.9 0.3  --   --   PROT 6.9 6.1* 5.1*  --   --   ALBUMIN 4.0 3.5 2.9* 3.7 3.1*   Recent Labs    10/26/19 0000 11/02/19 1045 01/09/20 1820 01/10/20 0448 01/11/20 0154 01/21/20 0000  WBC 7.8   < > 6.9 7.6 7.5 6.0  NEUTROABS 5,936  --  5.1  --  5.6  --   HGB 13.9   < > 11.0* 10.9* 10.4* 10.7*  HCT 42   < > 34.4* 34.8* 32.9* 33*  MCV  --    < > 99.7 99.7 99.7  --   PLT 251   < > 305 287 281 261   < > = values in this interval not displayed.   Lab Results  Component Value Date   TSH 4.060 01/11/2020   Lab Results  Component Value Date   HGBA1C 5.9 (H) 11/04/2019   Lab Results  Component Value Date   CHOL 138 11/06/2019   HDL 49 11/06/2019   LDLCALC 72 11/06/2019   TRIG 86 11/06/2019   CHOLHDL 2.8 11/06/2019    Significant Diagnostic Results in last 30 days:  No results found.  Assessment/Plan: Weight gain Gained about #10Ibs in the past 3-4 months, no apparent edema, will update CBC/diff, CMP/eGFR, TSH.   Edema Not apparent.   Paroxysmal SVT (supraventricular tachycardia) (HCC)  Episodic tachycardia 120s, but mostly HR 60s, 58bpm  AP upon my examination today. The patient denied chest pain, palpitation, SOB, dizziness, change of vision, or focal weakness.  PSVToff Amiodarone10/2021 in hospital due to prolonged QT interval/syncope/fall due, started Metoprolol, continuedEliquis.   Will prn Metoprolol 12.36m daily for HR >110 Bp>100/60, f/u Cardilogy.    GERD (gastroesophageal reflux disease) Stable, continue Omeprazole.   Depression, recurrent ([place] Her mood is stable, continue Sertraline.   Memory deficit Stable,  no behavioral issues, Hx of CVA, functioning well in [facility]  Stroke ([facility] Hospital stay 11/02/19-11/11/19 for acute CVA (MRI cevebral small acute infarcts in the posterior left hemisphere in the left PCA and MCA watershed area.   Anemia, iron deficiency 01/21/20 Iron 44, ferritin 63, Vit B12 695, Hgb 10.7     Family/ staff Communication: plan of care reviewed with the patient and charge nurse   Labs/tests ordered: CBC/diff, CMP/eGFR, TSH  Time spend 40 minutes.

## 2020-07-06 NOTE — Assessment & Plan Note (Signed)
Stable, no behavioral issues, Hx of CVA, functioning well in [place] [facility]

## 2020-07-06 NOTE — Assessment & Plan Note (Signed)
Gained about #10Ibs in the past 3-4 months, no apparent edema, will update CBC/diff, CMP/eGFR, TSH.

## 2020-07-06 NOTE — Assessment & Plan Note (Signed)
Hospital stay 11/02/19-11/11/19 for acute CVA (MRI cevebral small acute infarcts in the posterior left hemisphere in the left PCA and MCA watershed area.

## 2020-07-06 NOTE — Assessment & Plan Note (Signed)
Not apparent.  

## 2020-07-06 NOTE — Assessment & Plan Note (Signed)
Episodic tachycardia 120s, but mostly HR 60s, 58bpm AP upon my examination today. The patient denied chest pain, palpitation, SOB, dizziness, change of vision, or focal weakness.  PSVToff Amiodarone10/2021 in hospital due to prolonged QT interval/syncope/fall due, started Metoprolol, continuedEliquis.   Will prn Metoprolol 12.5mg  daily for HR >110 Bp>100/60, f/u Cardilogy.

## 2020-07-06 NOTE — Assessment & Plan Note (Signed)
01/21/20 Iron 44, ferritin 63, Vit B12 695, Hgb 10.7

## 2020-07-07 DIAGNOSIS — I10 Essential (primary) hypertension: Secondary | ICD-10-CM | POA: Diagnosis not present

## 2020-07-08 LAB — HEPATIC FUNCTION PANEL
ALT: 27 (ref 7–35)
AST: 35 (ref 13–35)
Alkaline Phosphatase: 81 (ref 25–125)
Bilirubin, Total: 0.7

## 2020-07-08 LAB — CBC AND DIFFERENTIAL
HCT: 44 (ref 36–46)
Hemoglobin: 14 (ref 12.0–16.0)
Neutrophils Absolute: 3586
Platelets: 222 (ref 150–399)
WBC: 5.5

## 2020-07-08 LAB — COMPREHENSIVE METABOLIC PANEL
Albumin: 3.8 (ref 3.5–5.0)
Calcium: 8.7 (ref 8.7–10.7)
GFR calc Af Amer: 60
GFR calc non Af Amer: 52
Globulin: 2.1

## 2020-07-08 LAB — TSH: TSH: 4.45 (ref 0.41–5.90)

## 2020-07-08 LAB — BASIC METABOLIC PANEL
BUN: 35 — AB (ref 4–21)
CO2: 22 (ref 13–22)
Chloride: 111 — AB (ref 99–108)
Creatinine: 0.9 (ref 0.5–1.1)
Glucose: 82
Potassium: 4.3 (ref 3.4–5.3)
Sodium: 144 (ref 137–147)

## 2020-07-08 LAB — CBC: RBC: 4.65 (ref 3.87–5.11)

## 2020-07-21 DIAGNOSIS — D1801 Hemangioma of skin and subcutaneous tissue: Secondary | ICD-10-CM | POA: Diagnosis not present

## 2020-07-21 DIAGNOSIS — L821 Other seborrheic keratosis: Secondary | ICD-10-CM | POA: Diagnosis not present

## 2020-07-21 DIAGNOSIS — L570 Actinic keratosis: Secondary | ICD-10-CM | POA: Diagnosis not present

## 2020-07-21 DIAGNOSIS — L814 Other melanin hyperpigmentation: Secondary | ICD-10-CM | POA: Diagnosis not present

## 2020-08-01 NOTE — Progress Notes (Signed)
Bianca [name] Date of Birth: October 01, 1922 MRN: [id number] Primary Care Provider:[name], [name], NP Former Cardiology Providers: Dr. Adrian Prows Primary Cardiologist: Rex Kras, DO, [facility] (established care 02/05/2020)  Date: 08/02/20 Last Office Visit: 02/05/2020  Chief Complaint  Patient presents with  . Atrial Flutter  . Follow-up    6 month    HPI  Bianca [name] is a 87 y.o.  female who presents to the office with a chief complaint of " 37-month follow-up for atrial flutter." Patient's past medical history and cardiovascular risk factors include: Paroxysmal SVT, paroxysmal atrial flutter, hyperlipidemia, grade 2 diastolic dysfunction, cognitive impairment suggestive of possible age-related dementia, history of stroke, postmenopausal female, advanced age.  Patient was seen in consultation in October 2021 when she was hospitalized status post fall for prolonged QT interval.  Patient is accompanied by her nephew [name] at today's office visit.  Patient provides verbal consent in regards to discussing her medical information in his presence.  Since last office visit patient states that she is doing well from a cardiovascular standpoint.  She denies any chest pain or shortness of breath at rest or with effort related activities.  She continues to reside at friend's home since her last encounter.  Given her history of paroxysmal atrial flutter she is currently on AV nodal blocking agents as well as Eliquis for thromboembolic prophylaxis.  She does not endorse any evidence of bleeding.  Given her history of prolonged QT interval we discontinued amiodarone at the last office visit.  She remains in normal sinus rhythm and no hospitalizations or urgent care visits since last office encounter.  ALLERGIES: Allergies  Allergen Reactions  . Azopt [Brinzolamide] Other (See Comments)    UNK  . Brimonidine Other (See Comments)    UNK  . Lumigan [Bimatoprost] Other (See Comments)    UNK    MEDICATION LIST PRIOR TO VISIT: Current Outpatient Medications on File Prior to Visit  Medication Sig Dispense Refill  . acetaminophen (TYLENOL) 325 MG tablet Take 650 mg by mouth every 6 (six) hours as needed for mild pain or headache.     . Amino Acids-Protein Hydrolys (FEEDING SUPPLEMENT, PRO-STAT 64,) LIQD Take 30 mLs by mouth daily.    Marland Kitchen apixaban (ELIQUIS) 2.5 MG TABS tablet Take 1 tablet (2.5 mg total) by mouth 2 (two) times daily. 60 tablet 1  . atorvastatin (LIPITOR) 20 MG tablet Take 1 tablet (20 mg total) by mouth daily at 6 PM. 30 tablet 1  . b complex vitamins tablet Take 1 tablet by mouth daily.     Marland Kitchen bismuth subsalicylate (PEPTO BISMOL) 262 MG/15ML suspension Take 60 mLs by mouth daily as needed for indigestion or diarrhea or loose stools.     . Calcium Carbonate-Vitamin D 600-400 MG-UNIT tablet Take 1 tablet by mouth daily.    . diphenoxylate-atropine (LOMOTIL) 2.5-0.025 MG tablet Take 1 tablet by mouth daily as needed for diarrhea or loose stools. 30 tablet 0  . lactose free nutrition (BOOST) LIQD Take 237 mLs by mouth daily.    . melatonin 3 MG TABS tablet Take 3 mg by mouth at bedtime.     . metoprolol tartrate (LOPRESSOR) 25 MG tablet Take 0.5 tablets (12.5 mg total) by mouth 2 (two) times daily. 60 tablet 0  . Multiple Vitamins-Minerals (CENTRUM SILVER PO) Take 1 tablet by mouth daily. 7am-10am.    . NON FORMULARY Moisturizing Cream to extremities with morning and evening care Every Shift    . NON FORMULARY  Regular Special Instructions: Regular Diet, Thin Liquids    . omeprazole (PRILOSEC) 20 MG capsule Take 20 mg by mouth daily.     . sertraline (ZOLOFT) 25 MG tablet Take 12.5 mg by mouth daily.    Marland Kitchen zinc oxide 20 % ointment Apply 1 application topically as needed for irritation.    . timolol (TIMOPTIC) 0.5 % ophthalmic solution SMARTSIG:In Eye(s)     No current facility-administered medications on file prior to visit.    PAST MEDICAL HISTORY: Past Medical  History:  Diagnosis Date  . Atrial flutter, paroxysmal ([place])   . COVID-19   . Dementia ([place])   . Diastolic dysfunction   . Hyperlipidemia   . Hypertension   . PSVT (paroxysmal supraventricular tachycardia) ([place])   . Stroke ([place])   . 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 No pertinent surgical history.  FAMILY HISTORY: No family history of premature coronary disease or sudden cardiac death.   SOCIAL HISTORY:  The patient  reports that she has never smoked. She has never used smokeless tobacco. She reports that she does not drink alcohol and does not use drugs.  Review of Systems  Constitutional: Negative for chills and fever.  HENT: Negative for hoarse voice and nosebleeds.   Eyes: Negative for discharge, double vision and pain.  Cardiovascular: Negative for chest pain, claudication, dyspnea on exertion, leg swelling, near-syncope, orthopnea, palpitations, paroxysmal nocturnal dyspnea and syncope.  Respiratory: Negative for hemoptysis and shortness of breath.   Musculoskeletal: Positive for joint pain. Negative for muscle cramps and myalgias.  Gastrointestinal: Negative for abdominal pain, constipation, diarrhea, hematemesis, hematochezia, melena, nausea and vomiting.  Neurological: Negative for dizziness and light-headedness.    PHYSICAL EXAM: Vitals with BMI 08/02/2020 07/06/2020 03/15/2020  Height 4\' 5"  - 4\' 5"   Weight 108 lbs 10 oz 108 lbs 98 lbs 6 oz  BMI 32.35 - 57.32  Systolic 202 542 706  Diastolic 82 80 66  Pulse 67 58 90    CONSTITUTIONAL: Elderly frail appearing female, present in wheelchair, hemodynamically stable, no acute distress.  SKIN: Skin is warm and dry. No rash noted. No cyanosis. No pallor. No jaundice HEAD: Normocephalic and atraumatic.  EYES: No scleral icterus MOUTH/THROAT: Moist oral membranes.  NECK: No JVD present. No thyromegaly noted. No  carotid bruits  LYMPHATIC: No visible cervical adenopathy.  CHEST Normal respiratory effort. No intercostal retractions  LUNGS: Clear to auscultation bilaterally. No stridor. No wheezes. No rales.  CARDIOVASCULAR: Regular rate and rhythm, positive S1-S2, positive C3-J6, holosystolic murmur heard at the left sternal border, no gallops or rubs ABDOMINAL: Obese, soft, nontender, nondistended, positive bowel sounds all 4 quadrants. No apparent ascites.  EXTREMITIES: No peripheral edema. Faint DP and PT.  HEMATOLOGIC: ecchymosis noted.  NEUROLOGIC: Oriented to person, place, and time. Nonfocal. Normal muscle tone.  PSYCHIATRIC: Normal mood and affect. Normal behavior. Cooperative  CARDIAC DATABASE: EKG: 08/02/2020: Sinus bradycardia, 54 bpm, first-degree AV block, left axis deviation, left anterior fascicular block, without underlying injury pattern, nonspecific T wave abnormality.   Echocardiogram: 10/27/2019: 1. Left ventricular ejection fraction, by estimation, is 60 to 65%. The left ventricle has normal function. The left ventricle has no regional  wall motion abnormalities. There is mild left ventricular hypertrophy. Left ventricular diastolic parameters  are consistent with Grade II diastolic dysfunction (pseudonormalization). Elevated left atrial pressure.  2. Right ventricular systolic function is normal. The right ventricular size is normal. There  is mildly elevated pulmonary artery systolic  pressure. The estimated right ventricular systolic pressure is 76.1 mmHg.  3. Right atrial size was mildly dilated.  4. The mitral valve is normal in structure. Trivial mitral valve regurgitation.  5. Tricuspid valve regurgitation is moderate to severe.  6. The aortic valve is tricuspid. Aortic valve regurgitation is mild. Mild aortic valve sclerosis is present, with no evidence of aortic valve stenosis.  7. The inferior vena cava is normal in size with greater than 50% respiratory  variability, suggesting right atrial pressure of 3 mmHg.   Carotid artery duplex07/30/2021: Right Carotid: Velocities in the right ICA are consistent with a 1-39% stenosis.  Left Carotid: Velocities in the left ICA are consistent with a 1-39% stenosis.  Vertebrals: Bilateral vertebral arteries demonstrate antegrade flow.   LABORATORY DATA: CBC Latest Ref Rng & Units 01/21/2020 01/11/2020 01/10/2020  WBC - 6.0 7.5 7.6  Hemoglobin 12.0 - 16.0 10.7(A) 10.4(L) 10.9(L)  Hematocrit 36 - 46 33(A) 32.9(L) 34.8(L)  Platelets 150 - 399 261 281 287    CMP Latest Ref Rng & Units 01/21/2020 01/11/2020 01/10/2020  Glucose 70 - 99 mg/dL - 91 86  BUN 4 - 21 17 15 14   Creatinine 0.5 - 1.1 0.8 0.84 0.69  Sodium 137 - 147 143 142 142  Potassium 3.4 - 5.3 4.2 4.0 3.6  Chloride 99 - 108 109(A) 109 110  CO2 13 - 22 26(A) 28 24  Calcium 8.7 - 10.7 8.1(A) 8.0(L) 8.2(L)  Total Protein 6.5 - 8.1 g/dL - - -  Total Bilirubin 0.3 - 1.2 mg/dL - - -  Alkaline Phos 25 - 125 73 - -  AST 13 - 35 22 - -  ALT 7 - 35 19 - -    Lipid Panel     Component Value Date/Time   CHOL 138 11/06/2019 0544   TRIG 86 11/06/2019 0544   HDL 49 11/06/2019 0544   CHOLHDL 2.8 11/06/2019 0544   VLDL 17 11/06/2019 0544   LDLCALC 72 11/06/2019 0544    Lab Results  Component Value Date   HGBA1C 5.9 (H) 11/04/2019   No components found for: NTPROBNP Lab Results  Component Value Date   TSH 4.060 01/11/2020   TSH 1.629 11/02/2019    Cardiac Panel (last 3 results) No results for input(s): CKTOTAL, CKMB, TROPONINIHS, RELINDX in the last 72 hours.  IMPRESSION:    ICD-10-CM   1. Paroxysmal SVT (supraventricular tachycardia) (HCC)  I47.1   2. Paroxysmal atrial flutter (HCC)  I48.92 EKG 12-Lead  3. Long term current use of anticoagulant  Z79.01   4. Mixed hyperlipidemia  E78.2      RECOMMENDATIONS: EVALETTE MONTROSE is a 87 y.o. female whose past medical history and cardiovascular risk factors include:  Paroxysmal  SVT, paroxysmal atrial flutter, hyperlipidemia, grade 2 diastolic dysfunction, cognitive impairment suggestive of possible age-related dementia, history of stroke, postmenopausal female, advanced age.  Paroxysmal atrial flutter:  Currently sinus rhythm.  Currently on thromboembolic prophylaxis given the elevated chads vas score.  Will continue to hold off amiodarone as patient remains in normal sinus rhythm and has had a history of prolonged QT.    Continue Lopressor.   We will continue to monitor  Long-term oral anticoagulation: Indication paroxysmal atrial flutter. Risks, benefits, alternatives to oral anticoagulation reemphasized. Fall precautions reviewed.  Paroxysmal supraventricular tachycardia: Currently asymptomatic. Continue beta-blocker therapy.  Hyperlipidemia: Continue statin therapy.  No significant changes made during today's office encounter.  We will follow-up in  6 months or sooner if needed.  FINAL MEDICATION LIST END OF ENCOUNTER: No orders of the defined types were placed in this encounter.   Medications Discontinued During This Encounter  Medication Reason  . sertraline (ZOLOFT) 25 MG tablet Error  . timolol (BETIMOL) 0.5 % ophthalmic solution Error     Current Outpatient Medications:  .  acetaminophen (TYLENOL) 325 MG tablet, Take 650 mg by mouth every 6 (six) hours as needed for mild pain or headache. , Disp: , Rfl:  .  Amino Acids-Protein Hydrolys (FEEDING SUPPLEMENT, PRO-STAT 64,) LIQD, Take 30 mLs by mouth daily., Disp: , Rfl:  .  apixaban (ELIQUIS) 2.5 MG TABS tablet, Take 1 tablet (2.5 mg total) by mouth 2 (two) times daily., Disp: 60 tablet, Rfl: 1 .  atorvastatin (LIPITOR) 20 MG tablet, Take 1 tablet (20 mg total) by mouth daily at 6 PM., Disp: 30 tablet, Rfl: 1 .  b complex vitamins tablet, Take 1 tablet by mouth daily. , Disp: , Rfl:  .  bismuth subsalicylate (PEPTO BISMOL) 262 MG/15ML suspension, Take 60 mLs by mouth daily as needed for  indigestion or diarrhea or loose stools. , Disp: , Rfl:  .  Calcium Carbonate-Vitamin D 600-400 MG-UNIT tablet, Take 1 tablet by mouth daily., Disp: , Rfl:  .  diphenoxylate-atropine (LOMOTIL) 2.5-0.025 MG tablet, Take 1 tablet by mouth daily as needed for diarrhea or loose stools., Disp: 30 tablet, Rfl: 0 .  lactose free nutrition (BOOST) LIQD, Take 237 mLs by mouth daily., Disp: , Rfl:  .  melatonin 3 MG TABS tablet, Take 3 mg by mouth at bedtime. , Disp: , Rfl:  .  metoprolol tartrate (LOPRESSOR) 25 MG tablet, Take 0.5 tablets (12.5 mg total) by mouth 2 (two) times daily., Disp: 60 tablet, Rfl: 0 .  Multiple Vitamins-Minerals (CENTRUM SILVER PO), Take 1 tablet by mouth daily. 7am-10am., Disp: , Rfl:  .  NON FORMULARY, Moisturizing Cream to extremities with morning and evening care Every Shift, Disp: , Rfl:  .  NON FORMULARY, Regular Special Instructions: Regular Diet, Thin Liquids, Disp: , Rfl:  .  omeprazole (PRILOSEC) 20 MG capsule, Take 20 mg by mouth daily. , Disp: , Rfl:  .  sertraline (ZOLOFT) 25 MG tablet, Take 12.5 mg by mouth daily., Disp: , Rfl:  .  zinc oxide 20 % ointment, Apply 1 application topically as needed for irritation., Disp: , Rfl:  .  timolol (TIMOPTIC) 0.5 % ophthalmic solution, SMARTSIG:In Eye(s), Disp: , Rfl:   Orders Placed This Encounter  Procedures  . EKG 12-Lead   --Continue cardiac medications as reconciled in final medication list. --Return in about 6 months (around 02/01/2021) for Follow up, Atrial flutter. Or sooner if needed. --Continue follow-up with your primary care physician regarding the management of your other chronic comorbid conditions.  Patient's questions and concerns were addressed to her satisfaction. She voices understanding of the instructions provided during this encounter.   This note was created using a voice recognition software as a result there may be grammatical errors inadvertently enclosed that do not reflect the nature of this  encounter. Every attempt is made to correct such errors.  Rex Kras, [place], [facility]  Pager: [phone] Office: [phone]

## 2020-08-02 ENCOUNTER — Other Ambulatory Visit: Payer: Self-pay

## 2020-08-02 ENCOUNTER — Ambulatory Visit: Payer: Medicare Other | Admitting: Cardiology

## 2020-08-02 ENCOUNTER — Encounter: Payer: Self-pay | Admitting: Cardiology

## 2020-08-02 VITALS — BP 144/82 | HR 67 | Temp 97.9°F | Resp 16 | Ht <= 58 in | Wt 108.6 lb

## 2020-08-02 DIAGNOSIS — I471 Supraventricular tachycardia: Secondary | ICD-10-CM

## 2020-08-02 DIAGNOSIS — Z7901 Long term (current) use of anticoagulants: Secondary | ICD-10-CM

## 2020-08-02 DIAGNOSIS — I4892 Unspecified atrial flutter: Secondary | ICD-10-CM

## 2020-08-02 DIAGNOSIS — E782 Mixed hyperlipidemia: Secondary | ICD-10-CM

## 2020-08-16 DIAGNOSIS — L570 Actinic keratosis: Secondary | ICD-10-CM | POA: Diagnosis not present

## 2020-08-16 DIAGNOSIS — L814 Other melanin hyperpigmentation: Secondary | ICD-10-CM | POA: Diagnosis not present

## 2020-08-22 ENCOUNTER — Encounter: Payer: Self-pay | Admitting: Nurse Practitioner

## 2020-08-22 ENCOUNTER — Non-Acute Institutional Stay: Payer: Medicare Other | Admitting: Nurse Practitioner

## 2020-08-22 DIAGNOSIS — K219 Gastro-esophageal reflux disease without esophagitis: Secondary | ICD-10-CM

## 2020-08-22 DIAGNOSIS — N1831 Chronic kidney disease, stage 3a: Secondary | ICD-10-CM | POA: Diagnosis not present

## 2020-08-22 DIAGNOSIS — F339 Major depressive disorder, recurrent, unspecified: Secondary | ICD-10-CM

## 2020-08-22 DIAGNOSIS — I639 Cerebral infarction, unspecified: Secondary | ICD-10-CM | POA: Diagnosis not present

## 2020-08-22 DIAGNOSIS — D509 Iron deficiency anemia, unspecified: Secondary | ICD-10-CM

## 2020-08-22 DIAGNOSIS — R609 Edema, unspecified: Secondary | ICD-10-CM

## 2020-08-22 DIAGNOSIS — F0150 Vascular dementia without behavioral disturbance: Secondary | ICD-10-CM | POA: Diagnosis not present

## 2020-08-22 DIAGNOSIS — I471 Supraventricular tachycardia: Secondary | ICD-10-CM | POA: Diagnosis not present

## 2020-08-22 NOTE — Assessment & Plan Note (Signed)
Stable,  takes Omeprazole. Hgb 14.0 07/08/20

## 2020-08-22 NOTE — Assessment & Plan Note (Signed)
takes Sertraline.Na 144

## 2020-08-22 NOTE — Assessment & Plan Note (Addendum)
No further weight gained, off Torsemide, no apparent edema BLE

## 2020-08-22 NOTE — Assessment & Plan Note (Signed)
Hospital stay 11/02/19-11/11/19 for acute CVA (MRI cevebral small acute infarcts in the posterior left hemisphere in the left PCA and MCA watershed area.

## 2020-08-22 NOTE — Progress Notes (Signed)
Location:   [facility] Room Number: [id number] Place of Service:  ALF (13) Provider:  Taos Tapp X Charliee Krenz, NP  Deshan Hemmelgarn X, NP  Patient Care Team: Nargis Abrams X, NP as PCP - General (Internal Medicine)  Extended Emergency Contact Information Primary Emergency ContactCasey Burkitt Mobile Phone: [phone] Relation: Nephew Secondary Emergency Contact: [name] Mobile Phone: [phone] Relation: Friend  Code Status:  DNR Goals of care: Advanced Directive information Advanced Directives 08/22/2020  Does Patient Have a Medical Advance Directive? Yes  Type of Paramedic of [place]as[place];Living will;Out of facility DNR (pink MOST or yellow form)  Does patient want to make changes to medical advance directive? No - Patient declined  Copy of [place] in Chart? Yes - validated most recent copy scanned in chart (See row information)  Pre-existing out of facility DNR order (yellow form or pink MOST form) Yellow form placed in chart (order not valid for inpatient use)     Chief Complaint  Patient presents with  . Medical Management of Chronic Issues    Routine follow up.  Marland Kitchen Health Maintenance    Discuss need for TD/Tdap vaccine and PNA vaccine.    HPI:  Pt is a 87 y.o. female seen today for medical management of chronic diseases.      Edema,not apparent,  offTorsemide.  PSVToff Amiodarone10/2021 in hospital due to prolonged QT interval/syncope/fall due, started Metoprolol, continuedEliquis.  GERD, takes Omeprazole. Hgb 14.0 07/08/20 Depression, takes Sertraline.Na 144  CKD Bun/creat 35/0.9, eGFR 52 07/08/20 Vascular dementia since CVA, no behavioral issues. MMSE 20/30 10/28/19 Hospital stay 11/02/19-11/11/19 for acute CVA (MRI cevebral small acute infarcts in the posterior left hemisphere in the left PCA and MCA watershed area.  Anemia, 01/21/20 Iron 44, ferritin 63, Vit  B12 695, Hgb 14.0 07/08/20  Past Medical History:  Diagnosis Date  . Atrial flutter, paroxysmal ([place])   . COVID-19   . Dementia ([place])   . Diastolic dysfunction   . Hyperlipidemia   . Hypertension   . PSVT (paroxysmal supraventricular tachycardia) ([place])   . Stroke ([place])   . 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)    History reviewed. No pertinent surgical history.  Allergies  Allergen Reactions  . Azopt [Brinzolamide] Other (See Comments)    UNK  . Brimonidine Other (See Comments)    UNK  . Lumigan [Bimatoprost] Other (See Comments)    UNK    Allergies as of 08/22/2020      Reactions   Azopt [brinzolamide] Other (See Comments)   UNK   Brimonidine Other (See Comments)   UNK   Lumigan [bimatoprost] Other (See Comments)   UNK      Medication List       Accurate as of Aug 22, 2020 11:59 PM. If you have any questions, ask your nurse or doctor.        STOP taking these medications   NON FORMULARY Stopped by: Delvecchio Madole X Willie Plain, NP     TAKE these medications   acetaminophen 325 MG tablet Commonly known as: TYLENOL Take 650 mg by mouth every 6 (six) hours as needed for mild pain or headache.   apixaban 2.5 MG Tabs tablet Commonly known as: ELIQUIS Take 1 tablet (2.5 mg total) by mouth 2 (two) times daily.   atorvastatin 20 MG tablet Commonly known as: LIPITOR Take 1 tablet (20 mg total) by mouth daily at 6 PM.   b complex vitamins tablet  Take 1 tablet by mouth daily.   bismuth subsalicylate 817 RN/16FB suspension Commonly known as: PEPTO BISMOL Take 60 mLs by mouth daily as needed for indigestion or diarrhea or loose stools.   Calcium Carbonate-Vitamin D 600-400 MG-UNIT tablet Take 1 tablet by mouth daily.   CENTRUM SILVER PO Take 1 tablet by mouth daily. 7am-10am.   diphenoxylate-atropine 2.5-0.025 MG tablet Commonly known as: LOMOTIL Take 1 tablet by mouth daily as needed for diarrhea or loose stools.    feeding supplement (PRO-STAT 64) Liqd Take 30 mLs by mouth 2 (two) times daily.   lactose free nutrition Liqd Take 237 mLs by mouth daily.   melatonin 3 MG Tabs tablet Take 3 mg by mouth at bedtime.   metoprolol tartrate 25 MG tablet Commonly known as: LOPRESSOR Take 0.5 tablets (12.5 mg total) by mouth 2 (two) times daily.   NON FORMULARY Moisturizing Cream to extremities with morning and evening care Every Shift   omeprazole 20 MG capsule Commonly known as: PRILOSEC Take 20 mg by mouth daily.   sertraline 25 MG tablet Commonly known as: ZOLOFT Take 12.5 mg by mouth daily.   timolol 0.5 % ophthalmic solution Commonly known as: TIMOPTIC SMARTSIG:In Eye(s)   zinc oxide 20 % ointment Apply 1 application topically as needed for irritation.       Review of Systems  Constitutional: Negative for activity change, fever and unexpected weight change.  HENT: Positive for hearing loss. Negative for congestion, trouble swallowing and voice change.   Eyes: Negative for visual disturbance.  Respiratory: Negative for cough and shortness of breath.   Cardiovascular: Negative for leg swelling.  Gastrointestinal: Negative for abdominal pain and constipation.  Genitourinary: Positive for frequency. Negative for dysuria and urgency.  Musculoskeletal: Positive for arthralgias and gait problem.  Skin: Negative for color change.  Neurological: Negative for speech difficulty, weakness and light-headedness.       Memory lapses.   Psychiatric/Behavioral: Positive for confusion. Negative for behavioral problems and sleep disturbance.    Immunization History  Administered Date(s) Administered  . Influenza, High Dose Seasonal PF 11/08/2018  . Influenza-Unspecified 11/07/2013, 01/14/2020  . Moderna SARS-COV2 Booster Vaccination 02/22/2020  . Moderna Sars-Covid-2 Vaccination 05/09/2019, 06/06/2019  . Pneumococcal-Unspecified 10/18/2014   Pertinent  Health Maintenance Due  Topic Date Due   . PNA vac Low Risk Adult (2 of 2 - PCV13) 10/18/2015  . INFLUENZA VACCINE  11/07/2020  . DEXA SCAN  Completed   Fall Risk  05/19/2019 05/14/2019  Falls in the past year? 0 0  Number falls in past yr: 0 0  Injury with Fall? 0 -   Functional Status Survey:    Vitals:   08/22/20 1336  BP: 132/66  Pulse: 62  Resp: 18  Temp: (!) 97.2 F (36.2 C)  SpO2: 92%  Weight: 105 lb 3.2 oz (47.7 kg)  Height: '4\' 5"'  (1.346 m)   Body mass index is 26.33 kg/m. Physical Exam Vitals and nursing note reviewed.  Constitutional:      Appearance: Normal appearance.  HENT:     Head: Normocephalic and atraumatic.     Mouth/Throat:     Mouth: Mucous membranes are moist.  Eyes:     Extraocular Movements: Extraocular movements intact.     Conjunctiva/sclera: Conjunctivae normal.     Pupils: Pupils are equal, round, and reactive to light.  Cardiovascular:     Rate and Rhythm: Normal rate and regular rhythm.     Heart sounds: No murmur heard.   Pulmonary:  Effort: Pulmonary effort is normal.     Breath sounds: No rales.  Abdominal:     General: Bowel sounds are normal.     Palpations: Abdomen is soft.     Tenderness: There is no abdominal tenderness.  Musculoskeletal:     Cervical back: Normal range of motion and neck supple.     Right lower leg: No edema.     Left lower leg: No edema.  Skin:    General: Skin is warm and dry.  Neurological:     General: No focal deficit present.     Mental Status: She is alert and oriented to person, place, and time. Mental status is at baseline.     Motor: No weakness.     Coordination: Coordination normal.     Gait: Gait abnormal.  Psychiatric:        Mood and Affect: Mood normal.        Behavior: Behavior normal.        Thought Content: Thought content normal.     Labs reviewed: Recent Labs    11/03/19 0454 11/04/19 0535 01/09/20 1820 01/09/20 2238 01/10/20 0448 01/11/20 0154 01/21/20 0000 07/08/20 0000  NA 143   < > 138  --  142 142  143 144  K 4.1   < > 3.3*  --  3.6 4.0 4.2 4.3  CL 103   < > 107  --  110 109 109* 111*  CO2 22   < > 22  --  24 28 26* 22  GLUCOSE 76   < > 101*  --  86 91  --   --   BUN 32*   < > 20  --  '14 15 17 ' 35*  CREATININE 1.02*   < > 0.81  --  0.69 0.84 0.8 0.9  CALCIUM 8.5*   < > 8.1*  --  8.2* 8.0* 8.1* 8.7  MG 2.3  --   --  1.9 2.0  --   --   --    < > = values in this interval not displayed.   Recent Labs    11/02/19 1045 11/03/19 0454 11/06/19 0544 11/19/19 0000 01/21/20 0000 07/08/20 0000  AST 34 32 34 24 22 35  ALT '19 18 18 18 19 27  ' ALKPHOS 64 57 45 64 73 81  BILITOT 0.8 0.9 0.3  --   --   --   PROT 6.9 6.1* 5.1*  --   --   --   ALBUMIN 4.0 3.5 2.9* 3.7 3.1* 3.8   Recent Labs    01/09/20 1820 01/10/20 0448 01/11/20 0154 01/21/20 0000 07/08/20 0000  WBC 6.9 7.6 7.5 6.0 5.5  NEUTROABS 5.1  --  5.6  --  3,586.00  HGB 11.0* 10.9* 10.4* 10.7* 14.0  HCT 34.4* 34.8* 32.9* 33* 44  MCV 99.7 99.7 99.7  --   --   PLT 305 287 281 261 222   Lab Results  Component Value Date   TSH 4.45 07/08/2020   Lab Results  Component Value Date   HGBA1C 5.9 (H) 11/04/2019   Lab Results  Component Value Date   CHOL 138 11/06/2019   HDL 49 11/06/2019   LDLCALC 72 11/06/2019   TRIG 86 11/06/2019   CHOLHDL 2.8 11/06/2019    Significant Diagnostic Results in last 30 days:  No results found.  Assessment/Plan CKD (chronic kidney disease) stage 3, GFR 30-59 ml/min Bun/creat 35/0.9, eGFR 52 07/08/20   Vascular dementia [facility]) Vascular  dementia since CVA, no behavioral issues. MMSE 10/28/19 20/30   Stroke [facility]) Hospital stay 11/02/19-11/11/19 for acute CVA (MRI cevebral small acute infarcts in the posterior left hemisphere in the left PCA and MCA watershed area.    Anemia, iron deficiency  01/21/20 Iron 44, ferritin 63, Vit B12 695, Hgb 14.0 07/08/20   Depression, recurrent (HCC) takes Sertraline.Na 144   GERD (gastroesophageal reflux disease) Stable,  takes Omeprazole. Hgb 14.0  07/08/20   Paroxysmal SVT (supraventricular tachycardia) (HCC) PSVToff Amiodarone10/2021 in hospital due to prolonged QT interval/syncope/fall due, started Metoprolol, continuedEliquis.    Edema No further weight gained, off Torsemide, no apparent edema BLE      Family/ staff Communication: plan of care reviewed with the patient and charge nurse.   Labs/tests ordered:  none  Time spend 40 minutes.

## 2020-08-22 NOTE — Assessment & Plan Note (Addendum)
Vascular dementia since CVA, no behavioral issues. MMSE 10/28/19 20/30

## 2020-08-22 NOTE — Assessment & Plan Note (Signed)
PSVToff Amiodarone10/2021 in hospital due to prolonged QT interval/syncope/fall due, started Metoprolol, continuedEliquis.

## 2020-08-22 NOTE — Assessment & Plan Note (Signed)
Bun/creat 35/0.9, eGFR 52 07/08/20 

## 2020-08-22 NOTE — Assessment & Plan Note (Signed)
01/21/20 Iron 44, ferritin 63, Vit B12 695, Hgb 14.0 07/08/20

## 2020-09-06 DIAGNOSIS — Z23 Encounter for immunization: Secondary | ICD-10-CM | POA: Diagnosis not present

## 2020-09-15 DIAGNOSIS — L570 Actinic keratosis: Secondary | ICD-10-CM | POA: Diagnosis not present

## 2020-09-15 DIAGNOSIS — L814 Other melanin hyperpigmentation: Secondary | ICD-10-CM | POA: Diagnosis not present

## 2020-10-19 DIAGNOSIS — R1312 Dysphagia, oropharyngeal phase: Secondary | ICD-10-CM | POA: Diagnosis not present

## 2020-10-19 DIAGNOSIS — R1310 Dysphagia, unspecified: Secondary | ICD-10-CM | POA: Diagnosis not present

## 2020-10-26 DIAGNOSIS — R1310 Dysphagia, unspecified: Secondary | ICD-10-CM | POA: Diagnosis not present

## 2020-10-26 DIAGNOSIS — R1312 Dysphagia, oropharyngeal phase: Secondary | ICD-10-CM | POA: Diagnosis not present

## 2020-10-30 DIAGNOSIS — R1312 Dysphagia, oropharyngeal phase: Secondary | ICD-10-CM | POA: Diagnosis not present

## 2020-10-30 DIAGNOSIS — R1310 Dysphagia, unspecified: Secondary | ICD-10-CM | POA: Diagnosis not present

## 2020-11-01 DIAGNOSIS — G4700 Insomnia, unspecified: Secondary | ICD-10-CM | POA: Diagnosis not present

## 2020-11-01 DIAGNOSIS — F329 Major depressive disorder, single episode, unspecified: Secondary | ICD-10-CM | POA: Diagnosis not present

## 2020-11-09 DIAGNOSIS — L570 Actinic keratosis: Secondary | ICD-10-CM | POA: Diagnosis not present

## 2020-11-09 DIAGNOSIS — L821 Other seborrheic keratosis: Secondary | ICD-10-CM | POA: Diagnosis not present

## 2020-12-06 ENCOUNTER — Non-Acute Institutional Stay: Payer: Medicare Other | Admitting: Internal Medicine

## 2020-12-06 ENCOUNTER — Encounter: Payer: Self-pay | Admitting: Internal Medicine

## 2020-12-06 DIAGNOSIS — I471 Supraventricular tachycardia, unspecified: Secondary | ICD-10-CM

## 2020-12-06 DIAGNOSIS — R609 Edema, unspecified: Secondary | ICD-10-CM | POA: Diagnosis not present

## 2020-12-06 DIAGNOSIS — N1831 Chronic kidney disease, stage 3a: Secondary | ICD-10-CM

## 2020-12-06 DIAGNOSIS — D509 Iron deficiency anemia, unspecified: Secondary | ICD-10-CM

## 2020-12-06 DIAGNOSIS — F0150 Vascular dementia without behavioral disturbance: Secondary | ICD-10-CM | POA: Diagnosis not present

## 2020-12-06 DIAGNOSIS — K219 Gastro-esophageal reflux disease without esophagitis: Secondary | ICD-10-CM | POA: Diagnosis not present

## 2020-12-06 DIAGNOSIS — F339 Major depressive disorder, recurrent, unspecified: Secondary | ICD-10-CM

## 2020-12-06 DIAGNOSIS — I639 Cerebral infarction, unspecified: Secondary | ICD-10-CM | POA: Diagnosis not present

## 2020-12-06 NOTE — Progress Notes (Signed)
Location:   [place] Room Number: 295 Place of Service:  ALF [phone]) Provider:  Veleta Miners MD  [name], [name], NP  Patient Care Team: [name], [name], NP as PCP - General (Internal Medicine)  Extended Emergency Contact Information Primary Emergency ContactCasey Burkitt Mobile Phone: [phone] Relation: Nephew Secondary Emergency Contact: [name] Mobile Phone: [phone] Relation: Friend  Code Status:  DNR Goals of care: Advanced Directive information Advanced Directives 12/06/2020  Does Patient Have a Medical Advance Directive? Yes  Type of Advance Directive Out of facility DNR (pink MOST or yellow form);[place];Living will  Does patient want to make changes to medical advance directive? No - Patient declined  Copy of [place] in Chart? Yes - validated most recent copy scanned in chart (See row information)  Pre-existing out of facility DNR order (yellow form or pink MOST form) Yellow form placed in chart (order not valid for inpatient use)     Chief Complaint  Patient presents with   Medical Management of Chronic Issues   Quality Metric Gaps    TDAP, Shingrix, PCV13, and flu shot    HPI:  Pt is a 87 y.o. female seen today for medical management of chronic diseases.    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  Intolerant to Amiodarone due to Prolong QT and Recurent Falls  Lives in [place] with her walker Continues to have issue with her Cognition but managing so far with her ADLS No Recent Falls  Past Medical History:  Diagnosis Date   Atrial flutter, paroxysmal (HCC)    COVID-19    Dementia (HCC)    Diastolic dysfunction    Hyperlipidemia     Hypertension    PSVT (paroxysmal supraventricular tachycardia) ([place])    Stroke ([place])    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 ([place])    History reviewed. No pertinent surgical history.  Allergies  Allergen Reactions   Azopt [Brinzolamide] Other (See Comments)    UNK   Brimonidine Other (See Comments)    UNK   Lumigan [Bimatoprost] Other (See Comments)    UNK    Allergies as of 12/06/2020       Reactions   Azopt [brinzolamide] Other (See Comments)   UNK   Brimonidine Other (See Comments)   UNK   Lumigan [bimatoprost] Other (See Comments)   UNK        Medication List        Accurate as of December 06, 2020  4:22 PM. If you have any questions, ask your nurse or doctor.          STOP taking these medications    diphenoxylate-atropine 2.5-0.025 MG tablet Commonly known as: LOMOTIL Stopped by: Virgie Dad, MD   feeding supplement (PRO-STAT 64) Liqd Stopped by: Virgie Dad, MD       TAKE these medications    acetaminophen 325 MG tablet Commonly known as: TYLENOL Take 650 mg by mouth every 6 (six) hours as needed for mild pain or headache.   apixaban 2.5 MG Tabs tablet Commonly known as: ELIQUIS Take 1 tablet (2.5 mg total) by mouth 2 (two) times daily.   atorvastatin 20 MG  tablet Commonly known as: LIPITOR Take 1 tablet (20 mg total) by mouth daily at 6 PM.   b complex vitamins tablet Take 1 tablet by mouth daily.   bismuth subsalicylate 948 NI/62VO suspension Commonly known as: PEPTO BISMOL Take 60 mLs by mouth daily as needed for indigestion or diarrhea or loose stools.   Calcium Carbonate-Vitamin D 600-400 MG-UNIT tablet Take 1 tablet by mouth daily.   CENTRUM SILVER PO Take 1 tablet by mouth daily. 7am-10am.   lactose free nutrition Liqd Take 237 mLs by mouth daily.   melatonin 3 MG Tabs tablet Take 3 mg by mouth at bedtime.   metoprolol tartrate 25 MG tablet Commonly known as:  LOPRESSOR Take 0.5 tablets (12.5 mg total) by mouth 2 (two) times daily.   NON FORMULARY Moisturizing Cream to extremities with morning and evening care Every Shift   omeprazole 20 MG capsule Commonly known as: PRILOSEC Take 20 mg by mouth daily.   sertraline 25 MG tablet Commonly known as: ZOLOFT Take 12.5 mg by mouth daily.   timolol 0.5 % ophthalmic solution Commonly known as: TIMOPTIC SMARTSIG:In Eye(s)   zinc oxide 20 % ointment Apply 1 application topically as needed for irritation.        Review of Systems Review of Systems  Constitutional: Negative for activity change, appetite change, chills, diaphoresis, fatigue and fever.  HENT: Negative for mouth sores, postnasal drip, rhinorrhea, sinus pain and sore throat.   Respiratory: Negative for apnea, cough, chest tightness, shortness of breath and wheezing.   Cardiovascular: Negative for chest pain, palpitations and leg swelling.  Gastrointestinal: Negative for abdominal distention, abdominal pain, constipation, diarrhea, nausea and vomiting.  Genitourinary: Negative for dysuria and frequency.  Musculoskeletal: Negative for arthralgias, joint swelling and myalgias.  Skin: Negative for rash.  Neurological: Negative for dizziness, syncope, weakness, light-headedness and numbness.  Psychiatric/Behavioral: Negative for behavioral problems, confusion and sleep disturbance.   Immunization History  Administered Date(s) Administered   Influenza, High Dose Seasonal PF 11/08/2018   Influenza-Unspecified 11/07/2013, 01/14/2020   Moderna SARS-COV2 Booster Vaccination 02/22/2020, 09/06/2020   Moderna Sars-Covid-2 Vaccination 05/09/2019, 06/06/2019   Pneumococcal-Unspecified 10/18/2014   Pertinent  Health Maintenance Due  Topic Date Due   PNA vac Low Risk Adult (2 of 2 - PCV13) 10/18/2015   INFLUENZA VACCINE  11/07/2020   DEXA SCAN  Completed   Fall Risk  05/19/2019 05/14/2019  Falls in the past year? 0 0  Number falls in  past yr: 0 0  Injury with Fall? 0 -   Functional Status Survey:    Vitals:   12/06/20 1611  BP: 110/60  Pulse: (!) 56  Resp: 16  Temp: 98.2 F (36.8 C)  SpO2: 98%  Weight: 101 lb 3.2 oz (45.9 kg)  Height: 4\' 5"  (1.346 m)   Body mass index is 25.33 kg/m. Physical Exam Constitutional:  Well-developed and well-nourished.  HENT:  Head: Normocephalic.  Mouth/Throat: Oropharynx is clear and moist.  Eyes: Pupils are equal, round, and reactive to light.  Neck: Neck supple.  Cardiovascular: Normal rate and normal heart sounds.  No murmur heard. Pulmonary/Chest: Effort normal and breath sounds normal. No respiratory distress. No wheezes. She has no rales.  Abdominal: Soft. Bowel sounds are normal. No distension. There is no tenderness. There is no rebound.  Musculoskeletal: No edema.  Lymphadenopathy: none Neurological: No Focal Deficits Skin: Skin is warm and dry.  Psychiatric: Normal mood and affect. Behavior is normal. Thought content normal.   Labs reviewed: Recent Labs  01/09/20 1820 01/09/20 2238 01/10/20 0448 01/11/20 0154 01/21/20 0000 07/08/20 0000  NA 138  --  142 142 143 144  K 3.3*  --  3.6 4.0 4.2 4.3  CL 107  --  110 109 109* 111*  CO2 22  --  24 28 26* 22  GLUCOSE 101*  --  86 91  --   --   BUN 20  --  14 15 17  35*  CREATININE 0.81  --  0.69 0.84 0.8 0.9  CALCIUM 8.1*  --  8.2* 8.0* 8.1* 8.7  MG  --  1.9 2.0  --   --   --    Recent Labs    01/21/20 0000 07/08/20 0000  AST 22 35  ALT 19 27  ALKPHOS 73 81  ALBUMIN 3.1* 3.8   Recent Labs    01/09/20 1820 01/10/20 0448 01/11/20 0154 01/21/20 0000 07/08/20 0000  WBC 6.9 7.6 7.5 6.0 5.5  NEUTROABS 5.1  --  5.6  --  3,586.00  HGB 11.0* 10.9* 10.4* 10.7* 14.0  HCT 34.4* 34.8* 32.9* 33* 44  MCV 99.7 99.7 99.7  --   --   PLT 305 287 281 261 222   Lab Results  Component Value Date   TSH 4.45 07/08/2020   Lab Results  Component Value Date   HGBA1C 5.9 (H) 11/04/2019   Lab Results   Component Value Date   CHOL 138 11/06/2019   HDL 49 11/06/2019   LDLCALC 72 11/06/2019   TRIG 86 11/06/2019   CHOLHDL 2.8 11/06/2019    Significant Diagnostic Results in last 30 days:  No results found.  Assessment/Plan Paroxysmal SVT (supraventricular tachycardia) (HCC) On Low Dose of Lopressor Off Amiodarone due to Prolong QT Mostly in Sinus Rhythm Few episodes of Asymptomatic Tachycardia Cerebrovascular accident (CVA), Embolic Now on Eliquis Gastroesophageal reflux disease, unspecified whether esophagitis present On Prilosec Depression, recurrent (HCC) Low dose of Zoloft Vascular dementia without behavioral disturbance ([place]) Doing well in AL so far Edema, Off Diuretics for now Stage 3a chronic kidney disease (HCC) Creat stable Iron deficiency anemia, Hgb normal now   Family/ staff Communication:   Labs/tests ordered:

## 2020-12-22 DIAGNOSIS — G4700 Insomnia, unspecified: Secondary | ICD-10-CM | POA: Diagnosis not present

## 2020-12-22 DIAGNOSIS — F329 Major depressive disorder, single episode, unspecified: Secondary | ICD-10-CM | POA: Diagnosis not present

## 2020-12-27 DIAGNOSIS — I10 Essential (primary) hypertension: Secondary | ICD-10-CM | POA: Diagnosis not present

## 2021-01-11 ENCOUNTER — Non-Acute Institutional Stay: Payer: Medicare Other | Admitting: Nurse Practitioner

## 2021-01-11 DIAGNOSIS — I471 Supraventricular tachycardia: Secondary | ICD-10-CM

## 2021-01-11 DIAGNOSIS — M25562 Pain in left knee: Secondary | ICD-10-CM

## 2021-01-11 DIAGNOSIS — I639 Cerebral infarction, unspecified: Secondary | ICD-10-CM | POA: Diagnosis not present

## 2021-01-11 DIAGNOSIS — W19XXXA Unspecified fall, initial encounter: Secondary | ICD-10-CM | POA: Diagnosis not present

## 2021-01-11 DIAGNOSIS — M79662 Pain in left lower leg: Secondary | ICD-10-CM | POA: Diagnosis not present

## 2021-01-11 DIAGNOSIS — R609 Edema, unspecified: Secondary | ICD-10-CM | POA: Diagnosis not present

## 2021-01-11 DIAGNOSIS — K219 Gastro-esophageal reflux disease without esophagitis: Secondary | ICD-10-CM | POA: Diagnosis not present

## 2021-01-11 DIAGNOSIS — D509 Iron deficiency anemia, unspecified: Secondary | ICD-10-CM | POA: Diagnosis not present

## 2021-01-11 DIAGNOSIS — F01B0 Vascular dementia, moderate, without behavioral disturbance, psychotic disturbance, mood disturbance, and anxiety: Secondary | ICD-10-CM | POA: Diagnosis not present

## 2021-01-11 DIAGNOSIS — M79605 Pain in left leg: Secondary | ICD-10-CM | POA: Insufficient documentation

## 2021-01-11 DIAGNOSIS — N1831 Chronic kidney disease, stage 3a: Secondary | ICD-10-CM

## 2021-01-11 NOTE — Assessment & Plan Note (Addendum)
c/o left knee pain sustained from a fall around 6am when she was found sitting on floor beside of her bed. The patient is able to walk with walker but c/o pain in the left knee/upper lower leg. No bruise or deformity noted. X-ray 2 views left knee, tibia, fibula. Tylenol 650mg  tid x 7 days. Apply left knee support. WBAT for now.  01/10/21 X-ray no acute bone abnormality.

## 2021-01-11 NOTE — Assessment & Plan Note (Signed)
off Amiodarone 01/2020 in hospital due to prolonged QT interval/syncope/fall due, started Metoprolol, continued Eliquis.

## 2021-01-11 NOTE — Assessment & Plan Note (Signed)
0/14/21 Iron 44, ferritin 63, Vit B12 695, Hgb 14.0 07/08/20

## 2021-01-11 NOTE — Assessment & Plan Note (Signed)
takes Omeprazole. Hgb 14.0 07/08/20

## 2021-01-11 NOTE — Assessment & Plan Note (Signed)
ascular dementia since CVA, no behavioral issues. MMSE 20/30 10/28/19

## 2021-01-11 NOTE — Progress Notes (Signed)
Location:   [facility] Room Number: 157 Place of Service:  ALF (13) Provider: Lennie Odor Leahna Hewson NP  Arliene Rosenow X, NP  Patient Care Team: Aarib Pulido X, NP as PCP - General (Internal Medicine)  Extended Emergency Contact Information Primary Emergency ContactCasey Burkitt Mobile Phone: [phone] Relation: Nephew Secondary Emergency Contact: [name] Mobile Phone: [phone] Relation: Friend  Code Status: DNR Goals of care: Advanced Directive information Advanced Directives 12/06/2020  Does Patient Have a Medical Advance Directive? Yes  Type of Advance Directive Out of facility DNR (pink MOST or yellow form);[place];Living will  Does patient want to make changes to medical advance directive? No - Patient declined  Copy of [place] in Chart? Yes - validated most recent copy scanned in chart (See row information)  Pre-existing out of facility DNR order (yellow form or pink MOST form) Yellow form placed in chart (order not valid for inpatient use)     Chief Complaint  Patient presents with   Acute Visit    Fall,left knee pain     HPI:  Pt is a 87 y.o. female seen today for an acute visit for c/o left knee pain sustained from a fall around 6am when she was found sitting on floor beside of her bed. The patient is able to walk with walker but c/o pain in the left knee/upper lower leg. No bruise or deformity noted.      Edema, not apparent,  off Torsemide.              PSVT off Amiodarone 01/2020 in hospital due to prolonged QT interval/syncope/fall due, started Metoprolol, continued Eliquis.              GERD, takes Omeprazole. Hgb 14.0 07/08/20             Depression, takes Sertraline.              CKD Bun/creat 35/0.9, eGFR 52 07/08/20             Vascular dementia since CVA, no behavioral issues. MMSE 20/30 10/28/19             Hospital stay 11/02/19-11/11/19 for acute CVA (MRI cevebral small acute infarcts in the posterior left  hemisphere in the left PCA and MCA watershed area. Takes Eliquis.              Anemia, 01/21/20 Iron 44, ferritin 63, Vit B12 695, Hgb 14.0 07/08/20    Past Medical History:  Diagnosis Date   Atrial flutter, paroxysmal (HCC)    COVID-19    Dementia (HCC)    Diastolic dysfunction    Hyperlipidemia    Hypertension    PSVT (paroxysmal supraventricular tachycardia) ([place])    Stroke ([place])    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 ([place])    History reviewed. No pertinent surgical history.  Allergies  Allergen Reactions   Azopt [Brinzolamide] Other (See Comments)    UNK   Brimonidine Other (See Comments)    UNK   Lumigan [Bimatoprost] Other (See Comments)    UNK    Allergies as of 01/11/2021       Reactions   Azopt [brinzolamide] Other (See Comments)   UNK   Brimonidine Other (See Comments)   UNK   Lumigan [bimatoprost] Other (See Comments)   UNK        Medication List        Accurate as of January 11, 2021 11:59 PM. If you have any questions, ask your nurse or doctor.          acetaminophen 325 MG tablet Commonly known as: TYLENOL Take 650 mg by mouth every 6 (six) hours as needed for mild pain or headache.   apixaban 2.5 MG Tabs tablet Commonly known as: ELIQUIS Take 1 tablet (2.5 mg total) by mouth 2 (two) times daily.   atorvastatin 20 MG tablet Commonly known as: LIPITOR Take 1 tablet (20 mg total) by mouth daily at 6 PM.   b complex vitamins tablet Take 1 tablet by mouth daily.   bismuth subsalicylate 709 GG/83MO suspension Commonly known as: PEPTO BISMOL Take 60 mLs by mouth daily as needed for indigestion or diarrhea or loose stools.   Calcium Carbonate-Vitamin D 600-400 MG-UNIT tablet Take 1 tablet by mouth daily.   CENTRUM SILVER PO Take 1 tablet by mouth daily. 7am-10am.   lactose free nutrition Liqd Take 237 mLs by mouth daily.   melatonin 3 MG Tabs tablet Take 3 mg by mouth at  bedtime.   metoprolol tartrate 12.5 mg Tabs tablet Commonly known as: LOPRESSOR Take 12.5 mg by mouth daily as needed. Use PRN if HR more then 110 and SBP more then 100   metoprolol tartrate 25 MG tablet Commonly known as: LOPRESSOR Take 0.5 tablets (12.5 mg total) by mouth 2 (two) times daily.   NON FORMULARY Moisturizing Cream to extremities with morning and evening care Every Shift   omeprazole 20 MG capsule Commonly known as: PRILOSEC Take 20 mg by mouth daily.   sertraline 25 MG tablet Commonly known as: ZOLOFT Take 12.5 mg by mouth daily.   timolol 0.5 % ophthalmic solution Commonly known as: TIMOPTIC SMARTSIG:In Eye(s)   zinc oxide 20 % ointment Apply 1 application topically as needed for irritation.        Review of Systems  Constitutional:  Negative for activity change, appetite change and fever.  HENT:  Positive for hearing loss. Negative for congestion, trouble swallowing and voice change.   Eyes:  Negative for visual disturbance.  Respiratory:  Negative for shortness of breath.   Cardiovascular:  Negative for leg swelling.  Gastrointestinal:  Negative for abdominal pain and constipation.  Genitourinary:  Positive for frequency. Negative for dysuria and urgency.  Musculoskeletal:  Positive for arthralgias and gait problem.       Left knee/lower leg pain when walking.   Skin:  Negative for color change.  Neurological:  Negative for speech difficulty, weakness and light-headedness.       Memory lapses.   Psychiatric/Behavioral:  Positive for confusion. Negative for behavioral problems and sleep disturbance.    Immunization History  Administered Date(s) Administered   Influenza, High Dose Seasonal PF 11/08/2018   Influenza-Unspecified 11/07/2013, 01/14/2020   Moderna SARS-COV2 Booster Vaccination 02/22/2020, 09/06/2020   Moderna Sars-Covid-2 Vaccination 05/09/2019, 06/06/2019   Pneumococcal-Unspecified 10/18/2014   Pertinent  Health Maintenance Due   Topic Date Due   INFLUENZA VACCINE  11/07/2020   DEXA SCAN  Completed   Fall Risk  05/19/2019 05/14/2019  Falls in the past year? 0 0  Number falls in past yr: 0 0  Injury with Fall? 0 -   Functional Status Survey:    Vitals:   01/11/21 1438  BP: 120/60  Pulse: 60  Resp: 16  Temp: (!) 96.9 F (36.1 C)  SpO2: 97%  Weight: 102 lb 12.8 oz (46.6 kg)  Height: '4\' 5"'  (1.346 m)   Body mass index is  25.73 kg/m. Physical Exam Vitals and nursing note reviewed.  Constitutional:      Appearance: Normal appearance.  HENT:     Head: Normocephalic and atraumatic.     Mouth/Throat:     Mouth: Mucous membranes are moist.  Eyes:     Extraocular Movements: Extraocular movements intact.     Conjunctiva/sclera: Conjunctivae normal.     Pupils: Pupils are equal, round, and reactive to light.  Cardiovascular:     Rate and Rhythm: Normal rate and regular rhythm.     Heart sounds: No murmur heard. Pulmonary:     Effort: Pulmonary effort is normal.     Breath sounds: No rales.  Abdominal:     General: Bowel sounds are normal.     Palpations: Abdomen is soft.     Tenderness: There is no abdominal tenderness.  Musculoskeletal:        General: Tenderness present.     Cervical back: Normal range of motion and neck supple.     Right lower leg: No edema.     Left lower leg: No edema.     Comments: C/o left knee and upper left lower leg pain with walking.   Skin:    General: Skin is warm and dry.  Neurological:     General: No focal deficit present.     Mental Status: She is alert and oriented to person, place, and time. Mental status is at baseline.     Motor: No weakness.     Coordination: Coordination normal.     Gait: Gait abnormal.  Psychiatric:        Mood and Affect: Mood normal.        Behavior: Behavior normal.        Thought Content: Thought content normal.    Labs reviewed: Recent Labs    01/21/20 0000 07/08/20 0000  NA 143 144  K 4.2 4.3  CL 109* 111*  CO2 26* 22   BUN 17 35*  CREATININE 0.8 0.9  CALCIUM 8.1* 8.7   Recent Labs    01/21/20 0000 07/08/20 0000  AST 22 35  ALT 19 27  ALKPHOS 73 81  ALBUMIN 3.1* 3.8   Recent Labs    01/21/20 0000 07/08/20 0000  WBC 6.0 5.5  NEUTROABS  --  3,586.00  HGB 10.7* 14.0  HCT 33* 44  PLT 261 222   Lab Results  Component Value Date   TSH 4.45 07/08/2020   Lab Results  Component Value Date   HGBA1C 5.9 (H) 11/04/2019   Lab Results  Component Value Date   CHOL 138 11/06/2019   HDL 49 11/06/2019   LDLCALC 72 11/06/2019   TRIG 86 11/06/2019   CHOLHDL 2.8 11/06/2019    Significant Diagnostic Results in last 30 days:  No results found.  Assessment/Plan: Left knee pain c/o left knee pain sustained from a fall around 6am when she was found sitting on floor beside of her bed. The patient is able to walk with walker but c/o pain in the left knee/upper lower leg. No bruise or deformity noted. X-ray 2 views left knee, tibia, fibula. Tylenol 610m tid x 7 days. Apply left knee support. WBAT for now.  01/10/21 X-ray no acute bone abnormality.   Edema not apparent,  off Torsemide.   Paroxysmal SVT (supraventricular tachycardia) (HCC)  off Amiodarone 01/2020 in hospital due to prolonged QT interval/syncope/fall due, started Metoprolol, continued Eliquis.   GERD (gastroesophageal reflux disease)  takes Omeprazole. Hgb 14.0 07/08/20  Vascular dementia ([place]) ascular dementia since CVA, no behavioral issues. MMSE 20/30 10/28/19  CKD (chronic kidney disease) stage 3, GFR 30-59 ml/min Bun/creat 35/0.9, eGFR 52 07/08/20  Stroke [facility]) Hospital stay 11/02/19-11/11/19 for acute CVA (MRI cevebral small acute infarcts in the posterior left hemisphere in the left PCA and MCA watershed area. Takes Eliquis.   Anemia, iron deficiency 0/14/21 Iron 44, ferritin 63, Vit B12 695, Hgb 14.0 07/08/20    Family/ staff Communication: plan of care reviewed with the patient and charge nurse.   Labs/tests ordered:  X-ray  Left knee, tibia/fibula  2 views.   Time spend 40 minutes.

## 2021-01-11 NOTE — Assessment & Plan Note (Signed)
not apparent, off Torsemide.  ?

## 2021-01-11 NOTE — Assessment & Plan Note (Signed)
Hospital stay 11/02/19-11/11/19 for acute CVA (MRI cevebral small acute infarcts in the posterior left hemisphere in the left PCA and MCA watershed area. Takes Eliquis.

## 2021-01-11 NOTE — Assessment & Plan Note (Signed)
Bun/creat 35/0.9, eGFR 52 07/08/20

## 2021-01-12 ENCOUNTER — Encounter: Payer: Self-pay | Admitting: Nurse Practitioner

## 2021-01-13 DIAGNOSIS — M25562 Pain in left knee: Secondary | ICD-10-CM | POA: Diagnosis not present

## 2021-01-13 DIAGNOSIS — R2681 Unsteadiness on feet: Secondary | ICD-10-CM | POA: Diagnosis not present

## 2021-01-13 DIAGNOSIS — R296 Repeated falls: Secondary | ICD-10-CM | POA: Diagnosis not present

## 2021-01-13 DIAGNOSIS — R29898 Other symptoms and signs involving the musculoskeletal system: Secondary | ICD-10-CM | POA: Diagnosis not present

## 2021-01-13 DIAGNOSIS — M6281 Muscle weakness (generalized): Secondary | ICD-10-CM | POA: Diagnosis not present

## 2021-01-16 DIAGNOSIS — R2681 Unsteadiness on feet: Secondary | ICD-10-CM | POA: Diagnosis not present

## 2021-01-16 DIAGNOSIS — M6281 Muscle weakness (generalized): Secondary | ICD-10-CM | POA: Diagnosis not present

## 2021-01-16 DIAGNOSIS — M25562 Pain in left knee: Secondary | ICD-10-CM | POA: Diagnosis not present

## 2021-01-16 DIAGNOSIS — R296 Repeated falls: Secondary | ICD-10-CM | POA: Diagnosis not present

## 2021-01-16 DIAGNOSIS — R29898 Other symptoms and signs involving the musculoskeletal system: Secondary | ICD-10-CM | POA: Diagnosis not present

## 2021-01-16 IMAGING — CR DG ANKLE COMPLETE 3+V*R*
3 series · 3 of 3 positions shown · non-contrast
Comparison: None.

CLINICAL DATA: Right leg laceration.

EXAM:
RIGHT ANKLE - COMPLETE 3+ VIEW

[ankle ap]
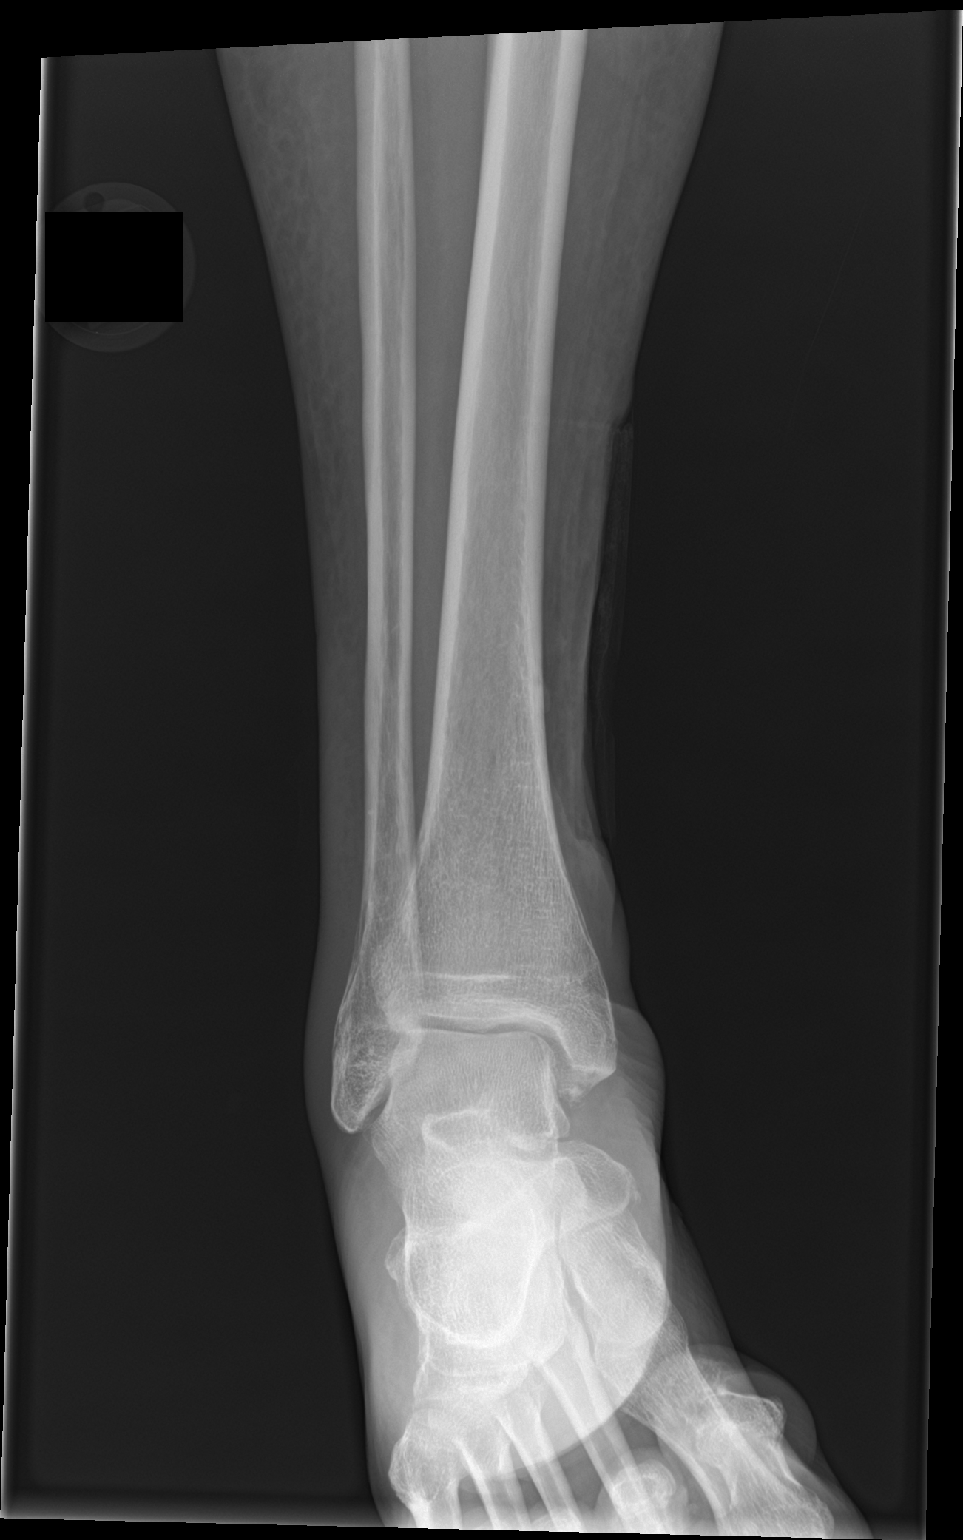

[ankle obl]
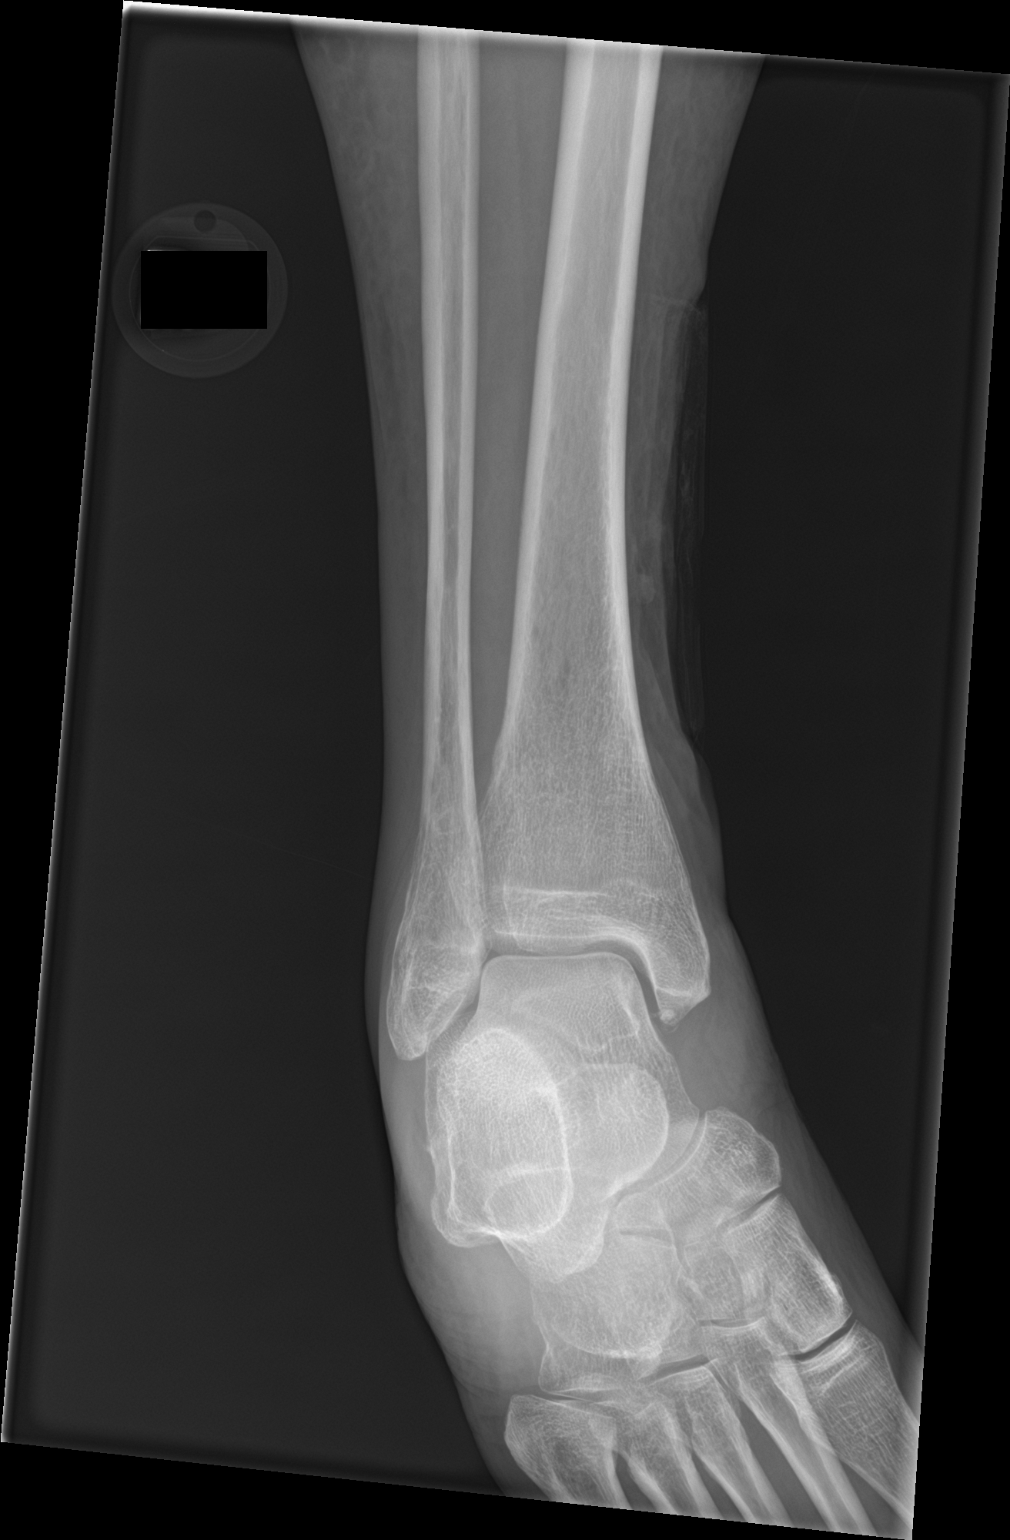

[ankle lat]
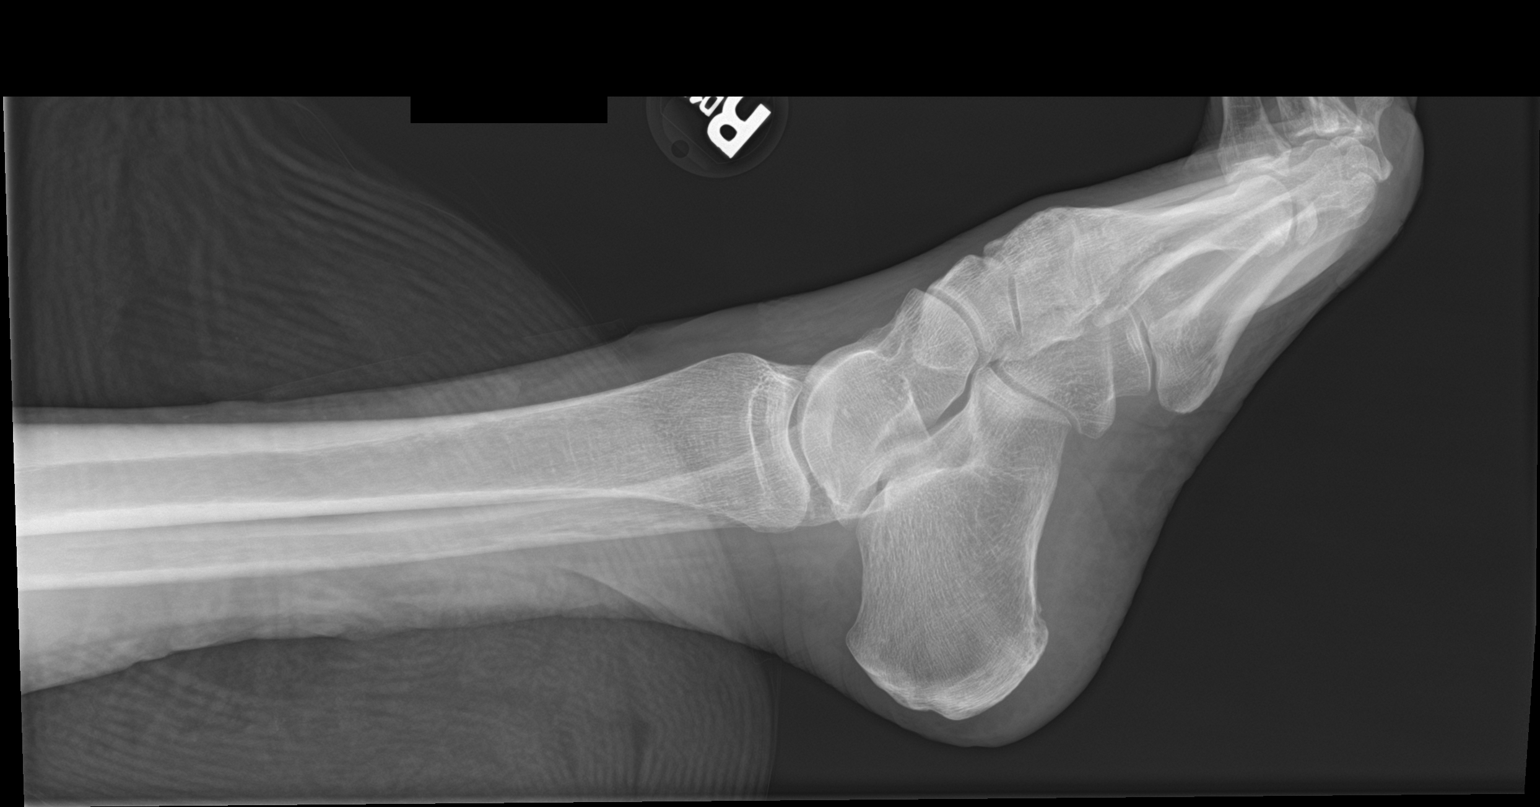

[3 of 3 positions shown; findings below may reference images not displayed]

FINDINGS: There is no evidence of fracture, dislocation, or joint effusion.
There is no evidence of arthropathy or other focal bone abnormality.
A 7.7 cm superficial soft tissue defect is seen adjacent to the
medial aspect of the distal right tibia.
IMPRESSION: Superficial soft tissue defect adjacent to the medial aspect of the
distal right tibia without an acute osseous abnormality.

## 2021-01-16 IMAGING — CT CT HEAD W/O CM
3 series · 15 of 47 positions shown, 18 images · non-contrast
Comparison: November 02, 2019

CLINICAL DATA: Mechanical fall from seated position

EXAM:
CT HEAD WITHOUT CONTRAST
TECHNIQUE: Contiguous axial images were obtained from the base of the skull
through the vertex without intravenous contrast.

[Series 3: head 5.0 h30s · axial · 0.40mm/px · z∈[-134,-4]mm · 9 of 32 slices shown, 12 images]
[im 3/32  brain]
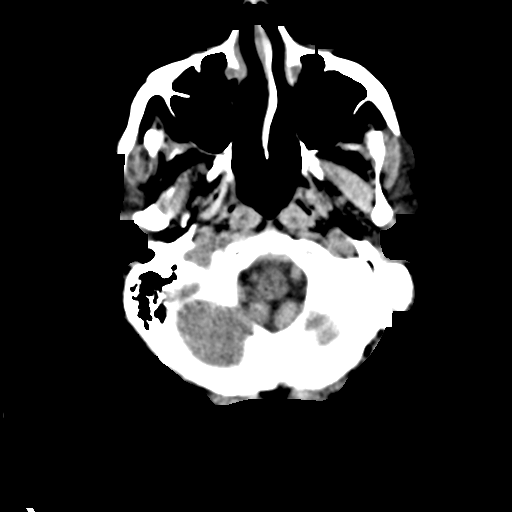
[im 3/32  bone]
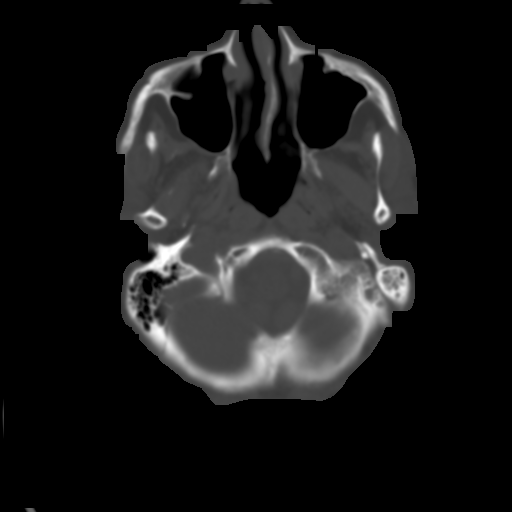
[im 6/32  brain]
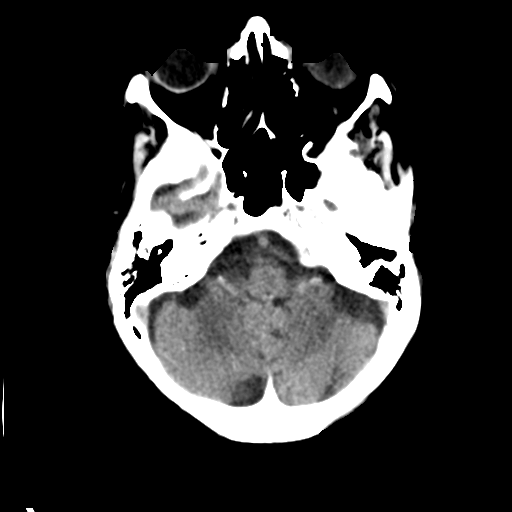
[im 9/32  brain]
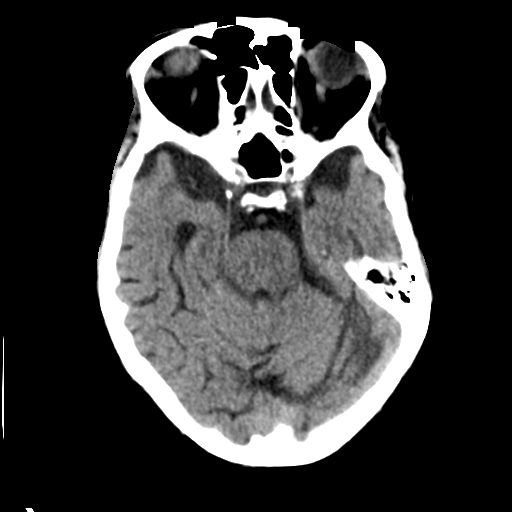
[im 12/32  brain]
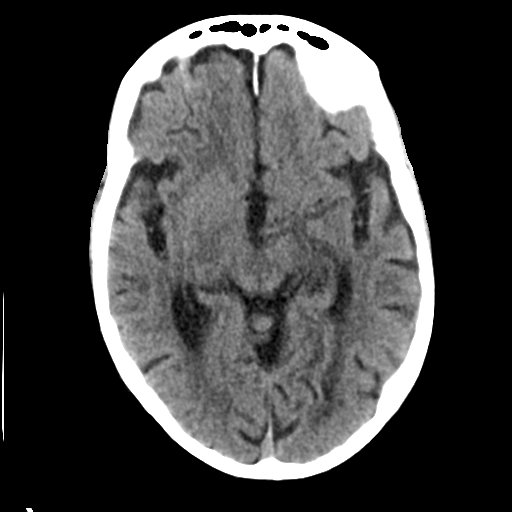
[im 17/32  brain]
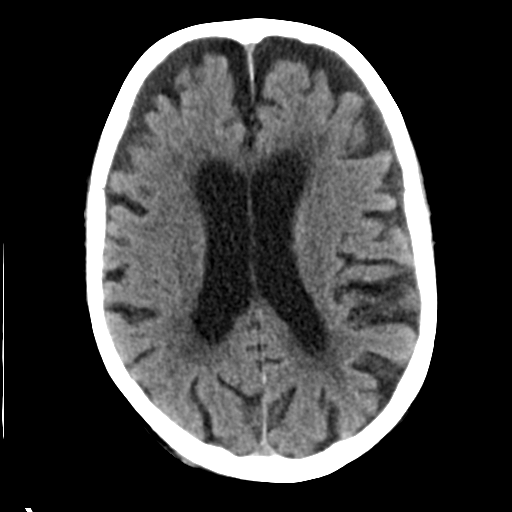
[im 17/32  bone]
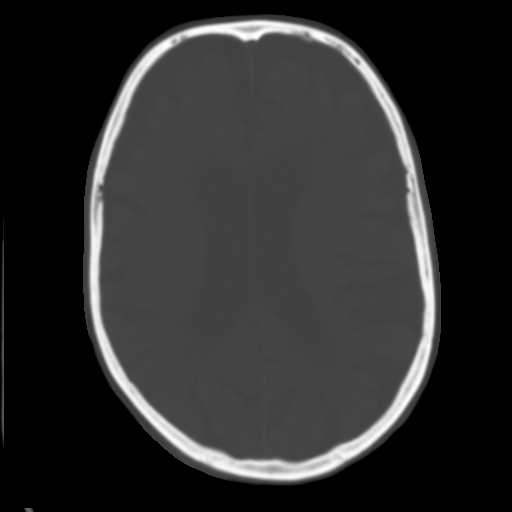
[im 20/32  brain]
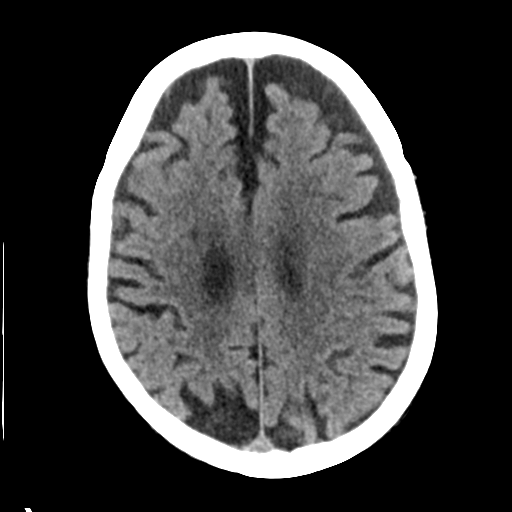
[im 23/32  brain]
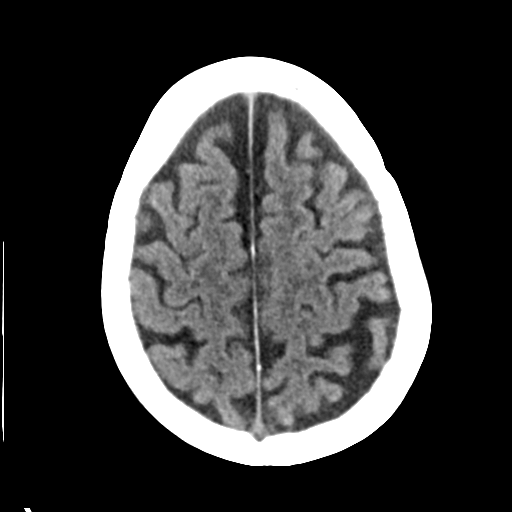
[im 26/32  brain]
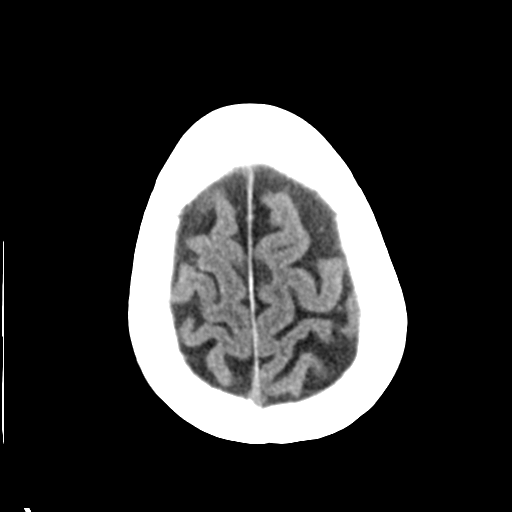
[im 29/32  brain]
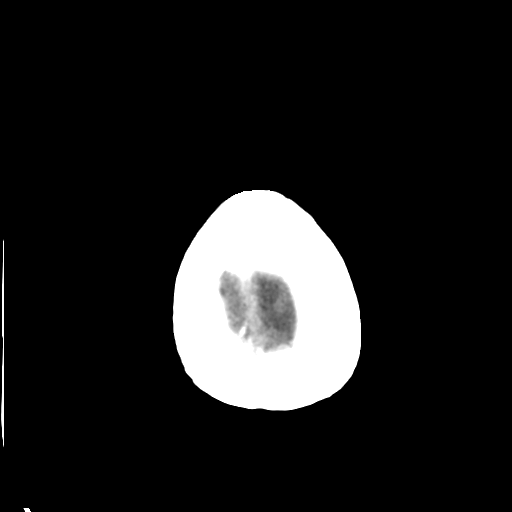
[im 29/32  bone]
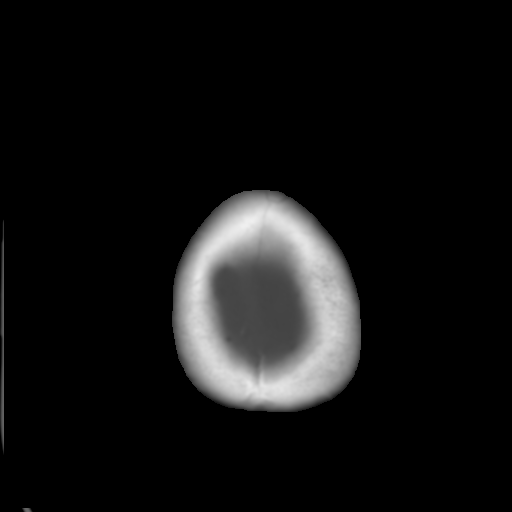

[Series 5: head 3.0 mpr cor · coronal · 0.31mm/px · 3 of 70 slices shown]
[im 24/70  brain]
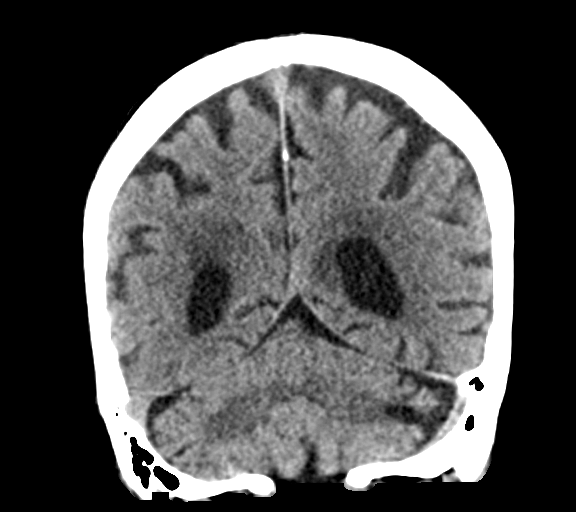
[im 31/70  brain]
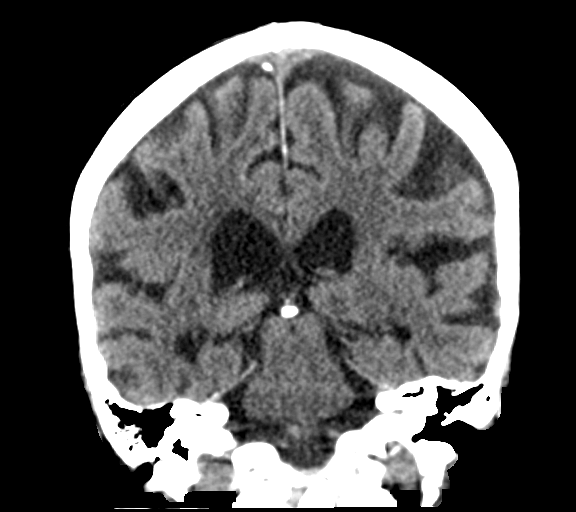
[im 39/70  brain]
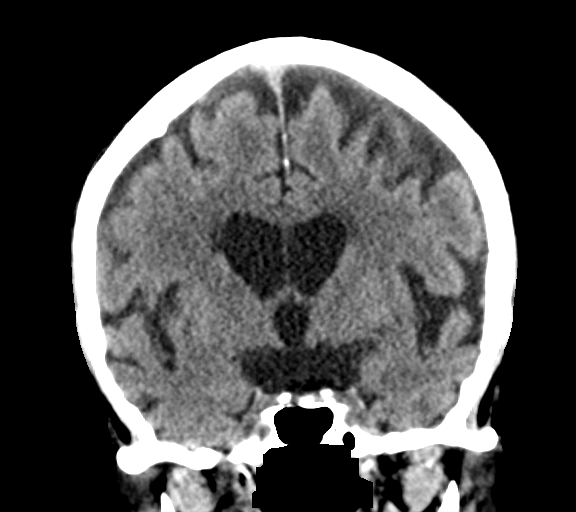

[Series 6: head 3.0 mpr sag · sagittal · 0.31mm/px · 3 of 59 slices shown]
[im 20/59  brain]
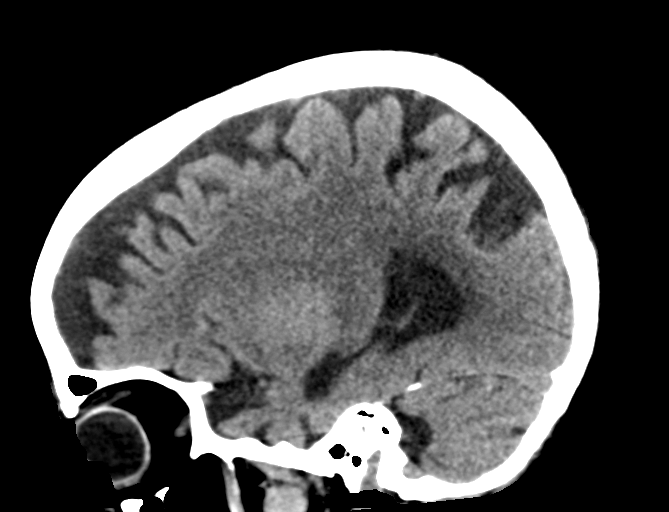
[im 30/59  brain]
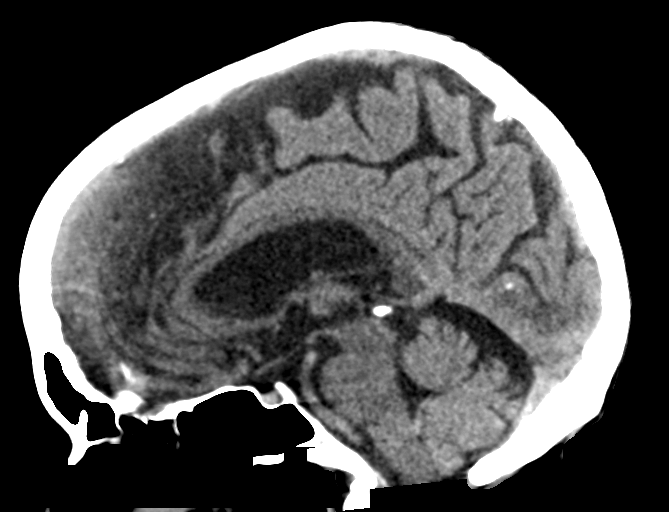
[im 39/59  brain]
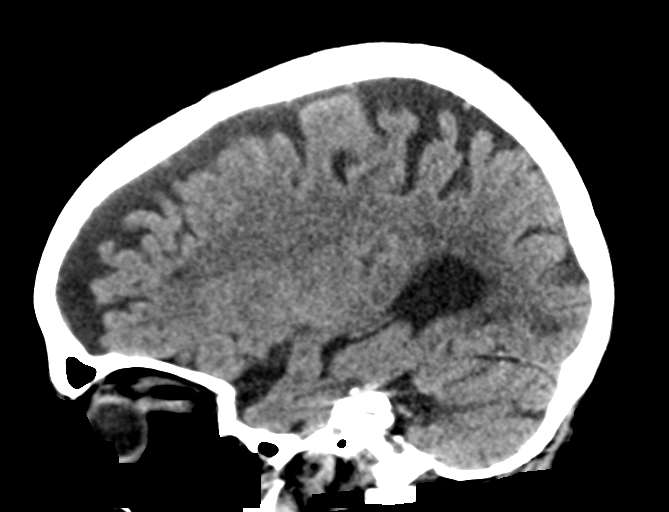

[15 of 47 positions shown; findings below may reference images not displayed]

FINDINGS: Brain: No evidence of acute territorial infarction, hemorrhage,
hydrocephalus,extra-axial collection or mass lesion/mass effect.
There is dilatation the ventricles and sulci consistent with
age-related atrophy. Low-attenuation changes in the deep white
matter consistent with small vessel ischemia.

Vascular: No hyperdense vessel or unexpected calcification.

Skull: The skull is intact. No fracture or focal lesion identified.

Sinuses/Orbits: The visualized paranasal sinuses and mastoid air
cells are clear. The orbits and globes intact.

Other: None
IMPRESSION: No acute intracranial abnormality.

Findings consistent with age related atrophy and chronic small
vessel ischemia

## 2021-01-18 ENCOUNTER — Emergency Department (HOSPITAL_COMMUNITY)
Admission: EM | Admit: 2021-01-18 | Discharge: 2021-01-18 | Disposition: A | Discharge status: Home / Self Care | Payer: Medicare Other | Attending: Emergency Medicine | Admitting: Emergency Medicine

## 2021-01-18 ENCOUNTER — Other Ambulatory Visit: Payer: Self-pay

## 2021-01-18 ENCOUNTER — Encounter (HOSPITAL_COMMUNITY): Payer: Self-pay | Admitting: Emergency Medicine

## 2021-01-18 ENCOUNTER — Emergency Department (HOSPITAL_COMMUNITY): Payer: Medicare Other

## 2021-01-18 ENCOUNTER — Encounter: Payer: Self-pay | Admitting: Orthopedic Surgery

## 2021-01-18 ENCOUNTER — Non-Acute Institutional Stay: Payer: Medicare Other | Admitting: Orthopedic Surgery

## 2021-01-18 DIAGNOSIS — R404 Transient alteration of awareness: Secondary | ICD-10-CM | POA: Diagnosis not present

## 2021-01-18 DIAGNOSIS — N183 Chronic kidney disease, stage 3 unspecified: Secondary | ICD-10-CM | POA: Insufficient documentation

## 2021-01-18 DIAGNOSIS — S81811A Laceration without foreign body, right lower leg, initial encounter: Secondary | ICD-10-CM | POA: Insufficient documentation

## 2021-01-18 DIAGNOSIS — I4891 Unspecified atrial fibrillation: Secondary | ICD-10-CM | POA: Diagnosis not present

## 2021-01-18 DIAGNOSIS — I129 Hypertensive chronic kidney disease with stage 1 through stage 4 chronic kidney disease, or unspecified chronic kidney disease: Secondary | ICD-10-CM | POA: Diagnosis not present

## 2021-01-18 DIAGNOSIS — K219 Gastro-esophageal reflux disease without esophagitis: Secondary | ICD-10-CM

## 2021-01-18 DIAGNOSIS — Z7901 Long term (current) use of anticoagulants: Secondary | ICD-10-CM | POA: Diagnosis not present

## 2021-01-18 DIAGNOSIS — N1831 Chronic kidney disease, stage 3a: Secondary | ICD-10-CM

## 2021-01-18 DIAGNOSIS — Y92009 Unspecified place in unspecified non-institutional (private) residence as the place of occurrence of the external cause: Secondary | ICD-10-CM | POA: Diagnosis not present

## 2021-01-18 DIAGNOSIS — M25552 Pain in left hip: Secondary | ICD-10-CM | POA: Diagnosis not present

## 2021-01-18 DIAGNOSIS — R296 Repeated falls: Secondary | ICD-10-CM

## 2021-01-18 DIAGNOSIS — I639 Cerebral infarction, unspecified: Secondary | ICD-10-CM | POA: Diagnosis not present

## 2021-01-18 DIAGNOSIS — Z79899 Other long term (current) drug therapy: Secondary | ICD-10-CM | POA: Diagnosis not present

## 2021-01-18 DIAGNOSIS — I471 Supraventricular tachycardia, unspecified: Secondary | ICD-10-CM

## 2021-01-18 DIAGNOSIS — R0789 Other chest pain: Secondary | ICD-10-CM | POA: Diagnosis not present

## 2021-01-18 DIAGNOSIS — F0150 Vascular dementia without behavioral disturbance: Secondary | ICD-10-CM | POA: Diagnosis not present

## 2021-01-18 DIAGNOSIS — W19XXXA Unspecified fall, initial encounter: Secondary | ICD-10-CM | POA: Diagnosis not present

## 2021-01-18 DIAGNOSIS — F0390 Unspecified dementia without behavioral disturbance: Secondary | ICD-10-CM | POA: Diagnosis not present

## 2021-01-18 DIAGNOSIS — S8991XA Unspecified injury of right lower leg, initial encounter: Secondary | ICD-10-CM | POA: Diagnosis present

## 2021-01-18 DIAGNOSIS — S0990XA Unspecified injury of head, initial encounter: Secondary | ICD-10-CM | POA: Insufficient documentation

## 2021-01-18 DIAGNOSIS — D509 Iron deficiency anemia, unspecified: Secondary | ICD-10-CM

## 2021-01-18 DIAGNOSIS — Z8616 Personal history of COVID-19: Secondary | ICD-10-CM | POA: Diagnosis not present

## 2021-01-18 DIAGNOSIS — R410 Disorientation, unspecified: Secondary | ICD-10-CM | POA: Diagnosis not present

## 2021-01-18 DIAGNOSIS — R58 Hemorrhage, not elsewhere classified: Secondary | ICD-10-CM | POA: Diagnosis not present

## 2021-01-18 DIAGNOSIS — G319 Degenerative disease of nervous system, unspecified: Secondary | ICD-10-CM | POA: Diagnosis not present

## 2021-01-18 NOTE — ED Notes (Signed)
Steri strips, bandage applied

## 2021-01-18 NOTE — ED Triage Notes (Signed)
[facility] is coming [facility] [place] complaining of falling. Unwitnessed fall. Patient is on xarelto. Patient has a laceration on right lower leg near ankle. Patient is A and O x 2. DNR form.

## 2021-01-18 NOTE — Discharge Instructions (Signed)
Bianca [name] had 10 stitches placed in her right leg wound.  These stitches need to be removed in 7-10 days (10/19-10/21).  This can be done by her doctor, urgent care, or any qualified care provider, and does not require coming back to the ER.  Keep her wound dry for the next 24 hours; afterwards she can shower normally.  The CT scan of her brain did not show signs of skull injury or brain bleed.

## 2021-01-18 NOTE — ED Notes (Signed)
Nephew is on his way to pick up patient

## 2021-01-18 NOTE — Telephone Encounter (Signed)
This encounter was created in error - please disregard.

## 2021-01-18 NOTE — ED Provider Notes (Signed)
[facility] DEPT Provider Note   CSN: [id number] Arrival date & time: 01/18/21  0145     History Chief Complaint  Patient presents with   Fall    Right leg laceration    Bianca [name] is a 87 y.o. female here with right leg laceration.  Pt here from [facility], reports she fell and scratched her right leg on a table.  She denies striking her head.  No LOC.  Pt has no complaints.  She is on xarelto for A Fib.  HPI     Past Medical History:  Diagnosis Date   Atrial flutter, paroxysmal ([place])    COVID-19    Dementia ([place])    Diastolic dysfunction    Hyperlipidemia    Hypertension    PSVT (paroxysmal supraventricular tachycardia) ([place])    Stroke ([place])    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 [facility])     Patient Active Problem List   Diagnosis Date Noted   Left knee pain 01/11/2021   Weight gain 07/06/2020   Anemia, iron deficiency 01/22/2020   Syncope    Prolonged QT interval    Paroxysmal atrial flutter ([place])    Long term current use of anticoagulant    Mixed hyperlipidemia    History of stroke    Diastolic dysfunction    Fall 01/09/2020   Depression, recurrent ([place]) 11/07/2019   Stroke ([place]) 11/05/2019   Tachycardia 11/03/2019   Hypotension 11/03/2019   Sinus bradycardia 11/02/2019   Acute lower UTI 11/02/2019   Vascular dementia ([place]) 10/26/2019   CKD (chronic kidney disease) stage 3, GFR 30-59 ml/min (HCC) 09/30/2019   Edema 06/11/2019   Metatarsalgia 06/11/2019   GERD (gastroesophageal reflux disease) 05/14/2019   Paroxysmal SVT (supraventricular tachycardia) ([place]) 04/27/2019   Pneumonia due to COVID-19 virus 04/27/2019   Chronic diarrhea 04/27/2019   Urinary frequency 04/27/2019   Protein-calorie malnutrition ([place]) 04/27/2019   Emphysema of [name] ([place]) 04/27/2019    History reviewed. No pertinent surgical history.   OB History   No obstetric history on  file.     History reviewed. No pertinent family history.  Social History   Tobacco Use   Smoking status: Never   Smokeless tobacco: Never  Vaping Use   Vaping Use: Never used  Substance Use Topics   Alcohol use: Never   Drug use: Never    Home Medications Prior to Admission medications   Medication Sig Start Date End Date Taking? Authorizing Provider  acetaminophen (TYLENOL) 325 MG tablet Take 650 mg by mouth every 6 (six) hours as needed for mild pain or headache.     [provider]  apixaban (ELIQUIS) 2.5 MG TABS tablet Take 1 tablet (2.5 mg total) by mouth 2 (two) times daily. 11/10/19   Hosie Poisson, MD  atorvastatin (LIPITOR) 20 MG tablet Take 1 tablet (20 mg total) by mouth daily at 6 PM. 11/10/19   Hosie Poisson, MD  b complex vitamins tablet Take 1 tablet by mouth daily.     [provider]  bismuth subsalicylate (PEPTO BISMOL) 262 MG/15ML suspension Take 60 mLs by mouth daily as needed for indigestion or diarrhea or loose stools.     [provider]  Calcium Carbonate-Vitamin D 600-400 MG-UNIT tablet Take 1 tablet by mouth daily.    [provider]  lactose free nutrition (BOOST) LIQD Take 237 mLs by mouth daily.    [provider]  melatonin 3 MG TABS  tablet Take 3 mg by mouth at bedtime.     [provider]  metoprolol tartrate (LOPRESSOR) 12.5 mg TABS tablet Take 12.5 mg by mouth daily as needed. Use PRN if HR more then 110 and SBP more then 100    [provider]  metoprolol tartrate (LOPRESSOR) 25 MG tablet Take 0.5 tablets (12.5 mg total) by mouth 2 (two) times daily. 01/12/20   Shary Key, DO  Multiple Vitamins-Minerals (CENTRUM SILVER PO) Take 1 tablet by mouth daily. 7am-10am.    [provider]  NON FORMULARY Moisturizing Cream to extremities with morning and evening care Every Shift    [provider]  omeprazole (PRILOSEC) 20 MG capsule Take 20 mg by mouth daily.     [provider]  sertraline (ZOLOFT) 25 MG tablet Take 12.5 mg by mouth daily.    [provider]  timolol (TIMOPTIC) 0.5 % ophthalmic solution SMARTSIG:In Eye(s) 08/01/20   [provider]  zinc oxide 20 % ointment Apply 1 application topically as needed for irritation.    [provider]    Allergies    Azopt [brinzolamide], Brimonidine, and Lumigan [bimatoprost]  Review of Systems   Review of Systems  Unable to perform ROS: Dementia (level 5 caveat)   Physical Exam Updated Vital Signs Ht 4\' 5"  (1.346 m)   Wt 46 kg   BMI 25.38 kg/m   Physical Exam Constitutional:      General: She is not in acute distress. HENT:     Head: Normocephalic and atraumatic.  Eyes:     Conjunctiva/sclera: Conjunctivae normal.     Pupils: Pupils are equal, round, and reactive to light.  Cardiovascular:     Rate and Rhythm: Normal rate and regular rhythm.  Pulmonary:     Effort: Pulmonary effort is normal. No respiratory distress.  Skin:    General: Skin is warm and dry.     Comments: 6 cm linear laceration to right lower leg through subcutaneous fat, does not violate fascia, no arterial bleed  Neurological:     General: No focal deficit present.     Mental Status: She is alert. Mental status is at baseline.  Psychiatric:        Mood and Affect: Mood normal.        Behavior: Behavior normal.    ED Results / Procedures / Treatments   Labs (all labs ordered are listed, but only abnormal results are displayed) Labs Reviewed - No data to display  EKG None  Radiology CT HEAD WO CONTRAST (5MM)  Result Date: 01/18/2021 CLINICAL DATA:  Minor head trauma with unwitnessed fall. EXAM: CT HEAD WITHOUT CONTRAST TECHNIQUE: Contiguous axial images were obtained from the base of the skull through the vertex without intravenous contrast. COMPARISON:  Head CT 01/09/2020 FINDINGS: Brain: No evidence of acute infarction, hemorrhage, hydrocephalus, extra-axial collection or mass  lesion/mass effect. Generalized brain atrophy. Chronic small vessel ischemia in the hemispheric white matter. Vascular: No hyperdense vessel or unexpected calcification. Skull: Normal. Negative for fracture or focal lesion. Sinuses/Orbits: No acute finding. IMPRESSION: No evidence of intracranial injury. Electronically Signed   By: Jorje Guild M.D.   On: 01/18/2021 09:19    Procedures .Marland KitchenLaceration Repair  Date/Time: 01/18/2021 9:35 AM Performed by: Wyvonnia Dusky, MD Authorized by: Wyvonnia Dusky, MD   Consent:    Consent obtained:  Verbal   Consent given by:  Patient   Risks, benefits, and alternatives were discussed: yes  Risks discussed:  Infection and poor cosmetic result Universal protocol:    Procedure explained and questions answered to patient or proxy's satisfaction: yes     Site/side marked: yes     Immediately prior to procedure, a time out was called: yes     Patient identity confirmed:  Arm band Anesthesia:    Anesthesia method:  None Laceration details:    Location:  Leg   Leg location:  R lower leg   Length (cm):  6   Depth (mm):  3 Pre-procedure details:    Preparation:  Patient was prepped and draped in usual sterile fashion Exploration:    Imaging outcome: foreign body not noted     Wound exploration: wound explored through full range of motion     Wound extent: no nerve damage noted     Contaminated: no   Treatment:    Area cleansed with:  Saline   Amount of cleaning:  Standard   Irrigation solution:  Sterile saline Skin repair:    Repair method:  Sutures   Suture size:  3-0   Suture material:  Prolene   Suture technique:  Simple interrupted and horizontal mattress   Number of sutures:  10 Approximation:    Approximation:  Close Repair type:    Repair type:  Complex Post-procedure details:    Dressing:  Non-adherent dressing   Procedure completion:  Tolerated well, no immediate complications   Medications Ordered in ED Medications -  No data to display  ED Course  I have reviewed the triage vital signs and the nursing notes.  Pertinent labs & imaging results that were available during my care of the patient were reviewed by me and considered in my medical decision making (see chart for details).  Laceration cleaned and repaired, 9 simple interrupted sutures (3-0) prolene and 1 horizontal mattress to bring together the gaping edges.  No active bleeding.  CTH reviewed and unremarkable - no acute traumatic injury.  Pt otherwise well appearing, okay for discharge back to facility.  Instructions written provided to facility for suture removal in 7-10 days.  Final Clinical Impression(s) / ED Diagnoses Final diagnoses:  Laceration of right lower extremity, initial encounter  Fall, initial encounter    Rx / DC Orders ED Discharge Orders     None        Betty Daidone, Carola Rhine, MD 01/18/21 [phone]

## 2021-01-19 ENCOUNTER — Encounter: Payer: Self-pay | Admitting: Orthopedic Surgery

## 2021-01-19 ENCOUNTER — Encounter: Payer: Self-pay | Admitting: Nurse Practitioner

## 2021-01-19 ENCOUNTER — Non-Acute Institutional Stay: Payer: Medicare Other | Admitting: Nurse Practitioner

## 2021-01-19 DIAGNOSIS — K219 Gastro-esophageal reflux disease without esophagitis: Secondary | ICD-10-CM | POA: Diagnosis not present

## 2021-01-19 DIAGNOSIS — R0789 Other chest pain: Secondary | ICD-10-CM | POA: Diagnosis not present

## 2021-01-19 DIAGNOSIS — F339 Major depressive disorder, recurrent, unspecified: Secondary | ICD-10-CM | POA: Diagnosis not present

## 2021-01-19 DIAGNOSIS — R29898 Other symptoms and signs involving the musculoskeletal system: Secondary | ICD-10-CM | POA: Diagnosis not present

## 2021-01-19 DIAGNOSIS — I10 Essential (primary) hypertension: Secondary | ICD-10-CM | POA: Diagnosis not present

## 2021-01-19 DIAGNOSIS — M25562 Pain in left knee: Secondary | ICD-10-CM

## 2021-01-19 DIAGNOSIS — W19XXXA Unspecified fall, initial encounter: Secondary | ICD-10-CM | POA: Diagnosis not present

## 2021-01-19 DIAGNOSIS — F01B0 Vascular dementia, moderate, without behavioral disturbance, psychotic disturbance, mood disturbance, and anxiety: Secondary | ICD-10-CM | POA: Diagnosis not present

## 2021-01-19 DIAGNOSIS — M6281 Muscle weakness (generalized): Secondary | ICD-10-CM | POA: Diagnosis not present

## 2021-01-19 DIAGNOSIS — I471 Supraventricular tachycardia: Secondary | ICD-10-CM

## 2021-01-19 DIAGNOSIS — R2681 Unsteadiness on feet: Secondary | ICD-10-CM | POA: Diagnosis not present

## 2021-01-19 DIAGNOSIS — D509 Iron deficiency anemia, unspecified: Secondary | ICD-10-CM

## 2021-01-19 DIAGNOSIS — R059 Cough, unspecified: Secondary | ICD-10-CM | POA: Diagnosis not present

## 2021-01-19 DIAGNOSIS — R296 Repeated falls: Secondary | ICD-10-CM | POA: Diagnosis not present

## 2021-01-19 LAB — HEPATIC FUNCTION PANEL
ALT: 26 (ref 7–35)
AST: 33 (ref 13–35)
Alkaline Phosphatase: 86 (ref 25–125)
Bilirubin, Total: 0.6

## 2021-01-19 LAB — BASIC METABOLIC PANEL
BUN: 36 — AB (ref 4–21)
CO2: 18 (ref 13–22)
Chloride: 105 (ref 99–108)
Creatinine: 1.2 — AB (ref 0.5–1.1)
Glucose: 231
Potassium: 4.5 (ref 3.4–5.3)
Sodium: 135 — AB (ref 137–147)

## 2021-01-19 LAB — CBC AND DIFFERENTIAL
HCT: 46 (ref 36–46)
Hemoglobin: 15.4 (ref 12.0–16.0)
Neutrophils Absolute: 7121
Platelets: 250 (ref 150–399)
WBC: 9.2

## 2021-01-19 LAB — CBC: RBC: 4.74 (ref 3.87–5.11)

## 2021-01-19 LAB — COMPREHENSIVE METABOLIC PANEL
Albumin: 4.1 (ref 3.5–5.0)
Calcium: 8.7 (ref 8.7–10.7)
Globulin: 2.5

## 2021-01-19 NOTE — Assessment & Plan Note (Signed)
takes Omeprazole. Hgb 14.0 07/08/20

## 2021-01-19 NOTE — Assessment & Plan Note (Addendum)
c/o chest wall R+L, positional, not with deep breathing or palpation. Pending CXR. 01/19/21 CXR no infiltrate, effusion, or acute findings

## 2021-01-19 NOTE — Assessment & Plan Note (Addendum)
Left knee pain, 01/10/21 negative xray for fx, completed 7 day scheduled Tylenol, ambulates with walker now. X-ray 01/19/21 Left hip no acute bone abnormality.

## 2021-01-19 NOTE — Assessment & Plan Note (Signed)
01/21/20 Iron 44, ferritin 63, Vit B12 695, Hgb 14.0 07/08/20

## 2021-01-19 NOTE — Assessment & Plan Note (Signed)
Vascular dementia since CVA, no behavioral issues. MMSE 20/30 10/28/19

## 2021-01-19 NOTE — Progress Notes (Addendum)
Location:   [facility] Room Number: 244 Place of Service:  ALF (13) Provider: Lennie Odor Emmanuella Mirante NP  Emmanuela Ghazi X, NP  Patient Care Team: Caelin Rosen X, NP as PCP - General (Internal Medicine)  Extended Emergency Contact Information Primary Emergency ContactCasey Burkitt Mobile Phone: [phone] Relation: Nephew Secondary Emergency Contact: [name] Mobile Phone: [phone] Relation: Friend  Code Status: DNR Goals of care: Advanced Directive information Advanced Directives 01/18/2021  Does Patient Have a Medical Advance Directive? No  Type of Advance Directive -  Does patient want to make changes to medical advance directive? -  Copy of [place] in Chart? -  Would patient like information on creating a medical advance directive? No - Patient declined  Pre-existing out of facility DNR order (yellow form or pink MOST form) -     Chief Complaint  Patient presents with   Acute Visit    Tachycardia, s/p fall, pain around chest wall.     HPI:  Pt is a 87 y.o. female seen today for an acute visit for HR 140bpm, asymptomatic, fell again after returned from ED, c/o chest wall R+L, positional, not with deep breathing or palpation. Pending CXR. Prn Metoprolol is available for her.   ED eval 01/18/21, fall, medial right lower leg laceration, sutures/steri strips closure intact, removal 7 days. Negative CT head.   Left knee pain, 01/10/21 negative xray for fx, completed 7 day scheduled Tylenol, ambulates with walker now  Edema, not apparent,  off Torsemide.              PSVT off Amiodarone 01/2020 in hospital due to prolonged QT interval/syncope/fall due, started Metoprolol, continued Eliquis.              GERD, takes Omeprazole. Hgb 14.0 07/08/20             Depression, takes Sertraline.              CKD Bun/creat 35/0.9, eGFR 52 07/08/20             Vascular dementia since CVA, no behavioral issues. MMSE 20/30 10/28/19             Hospital stay  11/02/19-11/11/19 for acute CVA (MRI cevebral small acute infarcts in the posterior left hemisphere in the left PCA and MCA watershed area. Takes Eliquis.              Anemia, 01/21/20 Iron 44, ferritin 63, Vit B12 695, Hgb 14.0 07/08/20 Past Medical History:  Diagnosis Date   Atrial flutter, paroxysmal (HCC)    COVID-19    Dementia (HCC)    Diastolic dysfunction    Hyperlipidemia    Hypertension    PSVT (paroxysmal supraventricular tachycardia) ([place])    Stroke ([place])    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 ([place])    History reviewed. No pertinent surgical history.  Allergies  Allergen Reactions   Azopt [Brinzolamide] Other (See Comments)    UNK   Brimonidine Other (See Comments)    UNK   Lumigan [Bimatoprost] Other (See Comments)    UNK    Allergies as of 01/19/2021       Reactions   Azopt [brinzolamide] Other (See Comments)   UNK   Brimonidine Other (See Comments)   UNK   Lumigan [bimatoprost] Other (See Comments)   UNK        Medication List        Accurate as of January 19, 2021 11:59 PM. If you have any questions, ask your nurse or doctor.          acetaminophen 325 MG tablet Commonly known as: TYLENOL Take 650 mg by mouth every 6 (six) hours as needed for mild pain or headache.   apixaban 2.5 MG Tabs tablet Commonly known as: ELIQUIS Take 1 tablet (2.5 mg total) by mouth 2 (two) times daily.   atorvastatin 20 MG tablet Commonly known as: LIPITOR Take 1 tablet (20 mg total) by mouth daily at 6 PM.   b complex vitamins tablet Take 1 tablet by mouth daily.   bismuth subsalicylate 579 UX/83FX suspension Commonly known as: PEPTO BISMOL Take 60 mLs by mouth daily as needed for indigestion or diarrhea or loose stools.   Calcium Carbonate-Vitamin D 600-400 MG-UNIT tablet Take 1 tablet by mouth daily.   CENTRUM SILVER PO Take 1 tablet by mouth daily. 7am-10am.   lactose free nutrition Liqd Take 237  mLs by mouth daily.   melatonin 3 MG Tabs tablet Take 3 mg by mouth at bedtime.   metoprolol tartrate 12.5 mg Tabs tablet Commonly known as: LOPRESSOR Take 12.5 mg by mouth daily as needed. Use PRN if HR more then 110 and SBP more then 100   metoprolol tartrate 25 MG tablet Commonly known as: LOPRESSOR Take 0.5 tablets (12.5 mg total) by mouth 2 (two) times daily.   NON FORMULARY Moisturizing Cream to extremities with morning and evening care Every Shift   omeprazole 20 MG capsule Commonly known as: PRILOSEC Take 20 mg by mouth daily.   sertraline 25 MG tablet Commonly known as: ZOLOFT Take 12.5 mg by mouth daily.   timolol 0.5 % ophthalmic solution Commonly known as: TIMOPTIC SMARTSIG:In Eye(s)   zinc oxide 20 % ointment Apply 1 application topically as needed for irritation.        Review of Systems  Constitutional:  Negative for activity change, appetite change and fever.  HENT:  Positive for hearing loss. Negative for congestion, trouble swallowing and voice change.   Eyes:  Negative for visual disturbance.  Respiratory:  Negative for shortness of breath.   Cardiovascular:  Negative for leg swelling.  Gastrointestinal:  Negative for abdominal pain and constipation.  Genitourinary:  Positive for frequency. Negative for dysuria and urgency.  Musculoskeletal:  Positive for arthralgias and gait problem.       C/o chest wall pain, R+L, positional. Left hip/leg pain-able to bear weight, walking.   Skin:  Positive for wound. Negative for color change.  Neurological:  Negative for speech difficulty, weakness and light-headedness.       Memory lapses.   Psychiatric/Behavioral:  Positive for confusion. Negative for behavioral problems and sleep disturbance.    Immunization History  Administered Date(s) Administered   DTaP 09/05/2004   Influenza, High Dose Seasonal PF 01/06/2014, 11/08/2018   Influenza, Quadrivalent, Recombinant, Inj, Pf 01/09/2018, 01/21/2019    Influenza-Unspecified 11/07/2013, 01/14/2020   Moderna SARS-COV2 Booster Vaccination 02/22/2020, 09/06/2020   Moderna Sars-Covid-2 Vaccination 05/09/2019, 06/06/2019   Pneumococcal Conjugate-13 01/06/2014   Pneumococcal-Unspecified 09/05/2004, 10/18/2014   Pertinent  Health Maintenance Due  Topic Date Due   INFLUENZA VACCINE  11/07/2020   DEXA SCAN  Completed   Fall Risk  05/19/2019 05/14/2019  Falls in the past year? 0 0  Number falls in past yr: 0 0  Injury with Fall? 0 -   Functional Status Survey:    Vitals:   01/19/21 1001  BP: (!) 106/58  Pulse: (!) 138  Resp: 18  Temp: 98.1 F (36.7 C)  SpO2: 95%   There is no height or weight on file to calculate BMI. Physical Exam Vitals and nursing note reviewed.  Constitutional:      Appearance: Normal appearance.  HENT:     Head: Normocephalic and atraumatic.     Mouth/Throat:     Mouth: Mucous membranes are moist.  Eyes:     Extraocular Movements: Extraocular movements intact.     Conjunctiva/sclera: Conjunctivae normal.     Pupils: Pupils are equal, round, and reactive to light.  Cardiovascular:     Rate and Rhythm: Normal rate and regular rhythm.     Heart sounds: No murmur heard. Pulmonary:     Effort: Pulmonary effort is normal.     Breath sounds: No rales.  Abdominal:     General: Bowel sounds are normal.     Palpations: Abdomen is soft.     Tenderness: There is no abdominal tenderness.  Musculoskeletal:        General: Tenderness present.     Cervical back: Normal range of motion and neck supple.     Right lower leg: No edema.     Left lower leg: No edema.     Comments: C/o R+L chest wall pain, positional. Left hip/leg pain, not palpated, able to walk/bear weight.   Skin:    General: Skin is warm and dry.     Findings: Erythema present.     Comments: Left lower chest wall abrasion. Medial right lower leg skin laceration is well approximated with sutures, steri strips, no s/s of infection.   Neurological:      General: No focal deficit present.     Mental Status: She is alert and oriented to person, place, and time. Mental status is at baseline.     Motor: No weakness.     Coordination: Coordination normal.     Gait: Gait abnormal.  Psychiatric:        Mood and Affect: Mood normal.        Behavior: Behavior normal.        Thought Content: Thought content normal.    Labs reviewed: Recent Labs    07/08/20 0000  NA 144  K 4.3  CL 111*  CO2 22  BUN 35*  CREATININE 0.9  CALCIUM 8.7   Recent Labs    07/08/20 0000  AST 35  ALT 27  ALKPHOS 81  ALBUMIN 3.8   Recent Labs    07/08/20 0000  WBC 5.5  NEUTROABS 3,586.00  HGB 14.0  HCT 44  PLT 222   Lab Results  Component Value Date   TSH 4.45 07/08/2020   Lab Results  Component Value Date   HGBA1C 5.9 (H) 11/04/2019   Lab Results  Component Value Date   CHOL 138 11/06/2019   HDL 49 11/06/2019   LDLCALC 72 11/06/2019   TRIG 86 11/06/2019   CHOLHDL 2.8 11/06/2019    Significant Diagnostic Results in last 30 days:  CT HEAD WO CONTRAST (5MM)  Result Date: 01/18/2021 CLINICAL DATA:  Minor head trauma with unwitnessed fall. EXAM: CT HEAD WITHOUT CONTRAST TECHNIQUE: Contiguous axial images were obtained from the base of the skull through the vertex without intravenous contrast. COMPARISON:  Head CT 01/09/2020 FINDINGS: Brain: No evidence of acute infarction, hemorrhage, hydrocephalus, extra-axial collection or mass lesion/mass effect. Generalized brain atrophy. Chronic small vessel ischemia in the hemispheric white matter. Vascular: No hyperdense vessel or unexpected calcification. Skull: Normal. Negative for fracture  or focal lesion. Sinuses/Orbits: No acute finding. IMPRESSION: No evidence of intracranial injury. Electronically Signed   By: Jorje Guild M.D.   On: 01/18/2021 09:19    Assessment/Plan: Paroxysmal SVT (supraventricular tachycardia) (HCC) HR 140bpm, asymptomatic, Prn Metoprolol is available for her, off  Amiodarone 01/2020 in hospital due to prolonged QT interval/syncope/fall due, started Metoprolol, continued Eliquis.   Chest wall pain c/o chest wall R+L, positional, not with deep breathing or palpation. Pending CXR. 01/19/21 CXR no infiltrate, effusion, or acute findings  Fall  fell again after returned from ED, ED eval 01/18/21, fall, medial right lower leg laceration, sutures/steri strips closure intact, removal 7 days. Negative CT head. Stat CBC/diff, CMP/eGFR  Left knee pain Left knee pain, 01/10/21 negative xray for fx, completed 7 day scheduled Tylenol, ambulates with walker now. X-ray 01/19/21 Left hip no acute bone abnormality.   GERD (gastroesophageal reflux disease) takes Omeprazole. Hgb 14.0 07/08/20  Depression, recurrent ([place]) takes Sertraline.   Vascular dementia [facility]) Vascular dementia since CVA, no behavioral issues. MMSE 20/30 10/28/19  Anemia, iron deficiency 01/21/20 Iron 44, ferritin 63, Vit B12 695, Hgb 14.0 07/08/20    Family/ staff Communication: plan of care reviewed with the patient and charge nurse.   Labs/tests ordered:  pending X-ray chest , left hip  Time spend 40 minutes.

## 2021-01-19 NOTE — Assessment & Plan Note (Signed)
fell again after returned from ED, ED eval 01/18/21, fall, medial right lower leg laceration, sutures/steri strips closure intact, removal 7 days. Negative CT head. Stat CBC/diff, CMP/eGFR

## 2021-01-19 NOTE — Assessment & Plan Note (Signed)
HR 140bpm, asymptomatic, Prn Metoprolol is available for her, off Amiodarone 01/2020 in hospital due to prolonged QT interval/syncope/fall due, started Metoprolol, continued Eliquis.

## 2021-01-19 NOTE — Assessment & Plan Note (Signed)
takes Sertraline.

## 2021-01-19 NOTE — Progress Notes (Signed)
Location:   [place] Room Number: 852 Place of Service:  ALF [phone]) Provider:  Windell Moulding, NP  [name], [name], NP  Patient Care Team: [name], [name], NP as PCP - General (Internal Medicine)  Extended Emergency Contact Information Primary Emergency ContactCasey Burkitt Mobile Phone: [phone] Relation: Nephew Secondary Emergency Contact: [name] Mobile Phone: [phone] Relation: Friend  Code Status:  DNR Goals of care: Advanced Directive information Advanced Directives 01/18/2021  Does Patient Have a Medical Advance Directive? No  Type of Advance Directive -  Does patient want to make changes to medical advance directive? -  Copy of [place] in Chart? -  Would patient like information on creating a medical advance directive? No - Patient declined  Pre-existing out of facility DNR order (yellow form or pink MOST form) -     Chief Complaint  Patient presents with   Acute Visit    Frequent Fall    HPI:  Pt is a 87 y.o. female seen today for an acute visit s/p f/u [name] ED visit.   She currently resides on the assisted living unit at [facility]. Past medical history includes: hypotension, atrial flutter, paroxymal SVT, emphysema, diarrhea, GERD, vascular dementia, CKD stage 3, depression, left knee pain, and anemia.   She is a poor historian due to AD. 10/12 she fell and developed a laceration to right leg. CT head no evidence of acute intracranial injury. She received 10 stiches to RLE and was discharged back to [facility]. A few hours after return, nursing staff reports she fell again. She complained of left sided weakness after event. Reports pain when taking deep breath, unable to describe pain. Vitals stable after event. No apparent injury.   Paroxysmal SVT- HR stable today, amiodarone intolerance due to prolonged OT intervals in past, remains on metoprolol bid and prn dose for HR > 110 Dementia- CT  head 01/18/2021- chronic small vessel ischemia in hemispheric white matter, no behavioral outbursts, very pleasant today CVA- remains on eliquis and statin GERD- hgb 10.7 07/2020, remains on prilosec CKD 3a- BUN/creat 35/0.9, GFR 52 07/2020 Iron deficiency anemia- Iron 44, ferritin 63, B12 695 01/2020   Past Medical History:  Diagnosis Date   Atrial flutter, paroxysmal (HCC)    COVID-19    Dementia (HCC)    Diastolic dysfunction    Hyperlipidemia    Hypertension    PSVT (paroxysmal supraventricular tachycardia) ([place])    Stroke ([place])    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 ([place])    No past surgical history on file.  Allergies  Allergen Reactions   Azopt [Brinzolamide] Other (See Comments)    UNK   Brimonidine Other (See Comments)    UNK   Lumigan [Bimatoprost] Other (See Comments)    UNK    Allergies as of 01/18/2021       Reactions   Azopt [brinzolamide] Other (See Comments)   UNK   Brimonidine Other (See Comments)   UNK   Lumigan [bimatoprost] Other (See Comments)   UNK        Medication List        Accurate as of January 18, 2021 11:59 PM. If you have any questions, ask your nurse or doctor.          acetaminophen 325 MG tablet Commonly known as: TYLENOL Take 650 mg by mouth every 6 (six) hours as needed for mild pain or headache.  apixaban 2.5 MG Tabs tablet Commonly known as: ELIQUIS Take 1 tablet (2.5 mg total) by mouth 2 (two) times daily.   atorvastatin 20 MG tablet Commonly known as: LIPITOR Take 1 tablet (20 mg total) by mouth daily at 6 PM.   b complex vitamins tablet Take 1 tablet by mouth daily.   bismuth subsalicylate 694 WN/46EV suspension Commonly known as: PEPTO BISMOL Take 60 mLs by mouth daily as needed for indigestion or diarrhea or loose stools.   Calcium Carbonate-Vitamin D 600-400 MG-UNIT tablet Take 1 tablet by mouth daily.   CENTRUM SILVER PO Take 1 tablet by  mouth daily. 7am-10am.   lactose free nutrition Liqd Take 237 mLs by mouth daily.   melatonin 3 MG Tabs tablet Take 3 mg by mouth at bedtime.   metoprolol tartrate 12.5 mg Tabs tablet Commonly known as: LOPRESSOR Take 12.5 mg by mouth daily as needed. Use PRN if HR more then 110 and SBP more then 100   metoprolol tartrate 25 MG tablet Commonly known as: LOPRESSOR Take 0.5 tablets (12.5 mg total) by mouth 2 (two) times daily.   NON FORMULARY Moisturizing Cream to extremities with morning and evening care Every Shift   omeprazole 20 MG capsule Commonly known as: PRILOSEC Take 20 mg by mouth daily.   sertraline 25 MG tablet Commonly known as: ZOLOFT Take 12.5 mg by mouth daily.   timolol 0.5 % ophthalmic solution Commonly known as: TIMOPTIC SMARTSIG:In Eye(s)   zinc oxide 20 % ointment Apply 1 application topically as needed for irritation.        Review of Systems  Unable to perform ROS: Dementia   Immunization History  Administered Date(s) Administered   Influenza, High Dose Seasonal PF 11/08/2018   Influenza-Unspecified 11/07/2013, 01/14/2020   Moderna SARS-COV2 Booster Vaccination 02/22/2020, 09/06/2020   Moderna Sars-Covid-2 Vaccination 05/09/2019, 06/06/2019   Pneumococcal-Unspecified 10/18/2014   Pertinent  Health Maintenance Due  Topic Date Due   INFLUENZA VACCINE  11/07/2020   DEXA SCAN  Completed   Fall Risk  05/19/2019 05/14/2019  Falls in the past year? 0 0  Number falls in past yr: 0 0  Injury with Fall? 0 -   Functional Status Survey:    Vitals:   01/18/21 1701  BP: 136/68  Pulse: 68  Resp: 16  Temp: 99 F (37.2 C)  SpO2: 99%  Weight: 102 lb 12.8 oz (46.6 kg)  Height: 4\' 5"  (1.346 m)   Body mass index is 25.73 kg/m. Physical Exam Vitals reviewed.  Constitutional:      General: She is not in acute distress. HENT:     Head: Normocephalic.  Eyes:     General:        Right eye: No discharge.        Left eye: No discharge.  Neck:      Vascular: No carotid bruit.  Cardiovascular:     Rate and Rhythm: Normal rate. Rhythm irregular.     Pulses: Normal pulses.     Heart sounds: Normal heart sounds. No murmur heard.    Comments: Tenderness to left side of chest, no bruising or deformity.  Pulmonary:     Effort: Pulmonary effort is normal. No respiratory distress.     Breath sounds: Normal breath sounds. No wheezing.  Abdominal:     General: Bowel sounds are normal. There is no distension.     Palpations: Abdomen is soft.     Tenderness: There is no abdominal tenderness.  Musculoskeletal:  Cervical back: Normal range of motion.     Left hip: Tenderness present. No deformity, lacerations or crepitus. Decreased range of motion.     Right lower leg: No edema.     Left lower leg: No edema.     Comments: Tenderness noted when pressing near left hip and left upper thigh. Left hip with limited ROM.   Lymphadenopathy:     Cervical: No cervical adenopathy.  Skin:    General: Skin is warm and dry.     Capillary Refill: Capillary refill takes less than 2 seconds.     Comments: 6 cm linear laceration to RLE, 10 stitches present, CDI, bloody drainage present on bandage, no swellling, mild tenderness noted.   Neurological:     General: No focal deficit present.     Mental Status: She is alert. Mental status is at baseline.     Motor: Weakness present.     Gait: Gait abnormal.  Psychiatric:        Mood and Affect: Mood normal.        Behavior: Behavior normal.        Cognition and Memory: Memory is impaired.    Labs reviewed: Recent Labs    01/21/20 0000 07/08/20 0000  NA 143 144  K 4.2 4.3  CL 109* 111*  CO2 26* 22  BUN 17 35*  CREATININE 0.8 0.9  CALCIUM 8.1* 8.7   Recent Labs    01/21/20 0000 07/08/20 0000  AST 22 35  ALT 19 27  ALKPHOS 73 81  ALBUMIN 3.1* 3.8   Recent Labs    01/21/20 0000 07/08/20 0000  WBC 6.0 5.5  NEUTROABS  --  3,586.00  HGB 10.7* 14.0  HCT 33* 44  PLT 261 222   Lab  Results  Component Value Date   TSH 4.45 07/08/2020   Lab Results  Component Value Date   HGBA1C 5.9 (H) 11/04/2019   Lab Results  Component Value Date   CHOL 138 11/06/2019   HDL 49 11/06/2019   LDLCALC 72 11/06/2019   TRIG 86 11/06/2019   CHOLHDL 2.8 11/06/2019    Significant Diagnostic Results in last 30 days:  CT HEAD WO CONTRAST (5MM)  Result Date: 01/18/2021 CLINICAL DATA:  Minor head trauma with unwitnessed fall. EXAM: CT HEAD WITHOUT CONTRAST TECHNIQUE: Contiguous axial images were obtained from the base of the skull through the vertex without intravenous contrast. COMPARISON:  Head CT 01/09/2020 FINDINGS: Brain: No evidence of acute infarction, hemorrhage, hydrocephalus, extra-axial collection or mass lesion/mass effect. Generalized brain atrophy. Chronic small vessel ischemia in the hemispheric white matter. Vascular: No hyperdense vessel or unexpected calcification. Skull: Normal. Negative for fracture or focal lesion. Sinuses/Orbits: No acute finding. IMPRESSION: No evidence of intracranial injury. Electronically Signed   By: Jorje Guild M.D.   On: 01/18/2021 09:19    Assessment/Plan 1. Laceration of right lower leg, initial encounter - 6 cm linear laceration to RLE - 10 stitches present - advised to remove stitches in 7-10 days  2. Left-sided chest wall pain - reports pain when taking deep breath, tenderness with palpation to left side - CXR to r/o rib fracture - start tylenol 1000 mg po bid x 1 month  3. Left hip pain - pain with palpation on exam , decreased ROM - left hip xray  - start tylenol 1000 mg po bid x 1 month  4. Recurrent falls - suspect related to cardiac history/SVT - orthostatic blood pressures x 2 days  5. Paroxysmal  SVT (supraventricular tachycardia) (HCC) - suspect cause of recent falls - HR 68 today - cont metoprolol bid - cont metoprolol prn for HR > 110  6. Vascular dementia without behavioral disturbance (HCC) - no recent  behavioral outbursts  7. Cerebrovascular accident (CVA), unspecified mechanism ([place]) - acute CVA with hospitalization 11/02/2019- 11/11/2019 - infarcts in posterior left hemisphere - cont eliquis  8. Gastroesophageal reflux disease, unspecified whether esophagitis present - cont Prilosec  9. Stage 3a chronic kidney disease (HCC) - BUN/creat 35/0.9, GFR 52 07/2020 - continue to avoid nephrotoxic drugs like NSAIDS and dose adjust medications to be renally excreted - encourage hydration  10. Iron deficiency anemia, unspecified iron deficiency anemia type - hgb 10.7 07/2020  - Iron 44, ferritin 63, B12 695 01/2020    Family/ staff Communication: plan discussed with patient and nurse  Labs/tests ordered:  CXR, left hip xray, orthostatic blood pressures x 2 days

## 2021-01-20 ENCOUNTER — Encounter: Payer: Self-pay | Admitting: Nurse Practitioner

## 2021-01-20 DIAGNOSIS — R2681 Unsteadiness on feet: Secondary | ICD-10-CM | POA: Diagnosis not present

## 2021-01-20 DIAGNOSIS — R296 Repeated falls: Secondary | ICD-10-CM | POA: Diagnosis not present

## 2021-01-20 DIAGNOSIS — M25562 Pain in left knee: Secondary | ICD-10-CM | POA: Diagnosis not present

## 2021-01-20 DIAGNOSIS — R29898 Other symptoms and signs involving the musculoskeletal system: Secondary | ICD-10-CM | POA: Diagnosis not present

## 2021-01-20 DIAGNOSIS — M6281 Muscle weakness (generalized): Secondary | ICD-10-CM | POA: Diagnosis not present

## 2021-01-23 DIAGNOSIS — M6281 Muscle weakness (generalized): Secondary | ICD-10-CM | POA: Diagnosis not present

## 2021-01-23 DIAGNOSIS — R29898 Other symptoms and signs involving the musculoskeletal system: Secondary | ICD-10-CM | POA: Diagnosis not present

## 2021-01-23 DIAGNOSIS — R296 Repeated falls: Secondary | ICD-10-CM | POA: Diagnosis not present

## 2021-01-23 DIAGNOSIS — M25562 Pain in left knee: Secondary | ICD-10-CM | POA: Diagnosis not present

## 2021-01-23 DIAGNOSIS — R2681 Unsteadiness on feet: Secondary | ICD-10-CM | POA: Diagnosis not present

## 2021-01-26 ENCOUNTER — Non-Acute Institutional Stay: Payer: Medicare Other | Admitting: Nurse Practitioner

## 2021-01-26 ENCOUNTER — Encounter: Payer: Self-pay | Admitting: Nurse Practitioner

## 2021-01-26 DIAGNOSIS — F339 Major depressive disorder, recurrent, unspecified: Secondary | ICD-10-CM | POA: Diagnosis not present

## 2021-01-26 DIAGNOSIS — R2681 Unsteadiness on feet: Secondary | ICD-10-CM | POA: Diagnosis not present

## 2021-01-26 DIAGNOSIS — D509 Iron deficiency anemia, unspecified: Secondary | ICD-10-CM

## 2021-01-26 DIAGNOSIS — R29898 Other symptoms and signs involving the musculoskeletal system: Secondary | ICD-10-CM | POA: Diagnosis not present

## 2021-01-26 DIAGNOSIS — I639 Cerebral infarction, unspecified: Secondary | ICD-10-CM

## 2021-01-26 DIAGNOSIS — K219 Gastro-esophageal reflux disease without esophagitis: Secondary | ICD-10-CM | POA: Diagnosis not present

## 2021-01-26 DIAGNOSIS — N1831 Chronic kidney disease, stage 3a: Secondary | ICD-10-CM | POA: Diagnosis not present

## 2021-01-26 DIAGNOSIS — M6281 Muscle weakness (generalized): Secondary | ICD-10-CM | POA: Diagnosis not present

## 2021-01-26 DIAGNOSIS — R296 Repeated falls: Secondary | ICD-10-CM | POA: Diagnosis not present

## 2021-01-26 DIAGNOSIS — L03115 Cellulitis of right lower limb: Secondary | ICD-10-CM | POA: Insufficient documentation

## 2021-01-26 DIAGNOSIS — M25562 Pain in left knee: Secondary | ICD-10-CM | POA: Diagnosis not present

## 2021-01-26 DIAGNOSIS — R000 Tachycardia, unspecified: Secondary | ICD-10-CM | POA: Diagnosis not present

## 2021-01-26 DIAGNOSIS — F01B0 Vascular dementia, moderate, without behavioral disturbance, psychotic disturbance, mood disturbance, and anxiety: Secondary | ICD-10-CM | POA: Diagnosis not present

## 2021-01-26 NOTE — Assessment & Plan Note (Signed)
since CVA, no behavioral issues. MMSE 20/30 10/28/19

## 2021-01-26 NOTE — Progress Notes (Signed)
Location:   [place] Room Number: 938 Place of Service:  ALF (13) Provider: Lennie Odor Calla Wedekind NP  Waylon Hershey X, NP  Patient Care Team: Alaze Garverick X, NP as PCP - General (Internal Medicine)  Extended Emergency Contact Information Primary Emergency ContactCasey Burkitt Mobile Phone: [phone] Relation: Nephew Secondary Emergency Contact: [name] Mobile Phone: [phone] Relation: Friend  Code Status: DNR Goals of care: Advanced Directive information Advanced Directives 01/18/2021  Does Patient Have a Medical Advance Directive? No  Type of Advance Directive -  Does patient want to make changes to medical advance directive? -  Copy of [place] in Chart? -  Would patient like information on creating a medical advance directive? No - Patient declined  Pre-existing out of facility DNR order (yellow form or pink MOST form) -     No chief complaint on file.   HPI:  Pt is a 87 y.o. female seen today for an acute visit for redness, warmth, swelling at the previous skin laceration site, sutures removed, wound is well approximated and healed.     ED eval 01/18/21, fall, medial right lower leg laceration             Left knee pain, 01/10/21 negative xray for fx, completed 7 day scheduled Tylenol, ambulates with walker now             Edema, not apparent,  off Torsemide.              PSVT off Amiodarone 01/2020 in hospital due to prolonged QT interval/syncope/fall due, started Metoprolol, continued Eliquis.              GERD, takes Omeprazole. Hgb 15.4 01/19/21             Depression, takes Sertraline.              CKD Bun/creat 36/1.18 eGFR 42 01/19/21             Vascular dementia since CVA, no behavioral issues. MMSE 20/30 10/28/19             Hospital stay 11/02/19-11/11/19 for acute CVA (MRI cevebral small acute infarcts in the posterior left hemisphere in the left PCA and MCA watershed area. Takes Eliquis.              Anemia, 01/21/20 Iron 44,  ferritin 63, Vit B12 695, Hgb 15.4 01/19/21  Past Medical History:  Diagnosis Date   Atrial flutter, paroxysmal (HCC)    COVID-19    Dementia (HCC)    Diastolic dysfunction    Hyperlipidemia    Hypertension    PSVT (paroxysmal supraventricular tachycardia) ([place])    Stroke ([place])    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 ([place])    History reviewed. No pertinent surgical history.  Allergies  Allergen Reactions   Azopt [Brinzolamide] Other (See Comments)    UNK   Brimonidine Other (See Comments)    UNK   Lumigan [Bimatoprost] Other (See Comments)    UNK    Allergies as of 01/26/2021       Reactions   Azopt [brinzolamide] Other (See Comments)   UNK   Brimonidine Other (See Comments)   UNK   Lumigan [bimatoprost] Other (See Comments)   UNK        Medication List        Accurate as of January 26, 2021 12:06 PM. If you have any questions, ask your nurse or doctor.  acetaminophen 325 MG tablet Commonly known as: TYLENOL Take 650 mg by mouth every 6 (six) hours as needed for mild pain or headache.   apixaban 2.5 MG Tabs tablet Commonly known as: ELIQUIS Take 1 tablet (2.5 mg total) by mouth 2 (two) times daily.   atorvastatin 20 MG tablet Commonly known as: LIPITOR Take 1 tablet (20 mg total) by mouth daily at 6 PM.   b complex vitamins tablet Take 1 tablet by mouth daily.   bismuth subsalicylate 741 SE/39RV suspension Commonly known as: PEPTO BISMOL Take 60 mLs by mouth daily as needed for indigestion or diarrhea or loose stools.   Calcium Carbonate-Vitamin D 600-400 MG-UNIT tablet Take 1 tablet by mouth daily.   CENTRUM SILVER PO Take 1 tablet by mouth daily. 7am-10am.   lactose free nutrition Liqd Take 237 mLs by mouth daily.   melatonin 3 MG Tabs tablet Take 3 mg by mouth at bedtime.   metoprolol tartrate 12.5 mg Tabs tablet Commonly known as: LOPRESSOR Take 12.5 mg by mouth daily as  needed. Use PRN if HR more then 110 and SBP more then 100   metoprolol tartrate 25 MG tablet Commonly known as: LOPRESSOR Take 0.5 tablets (12.5 mg total) by mouth 2 (two) times daily.   NON FORMULARY Moisturizing Cream to extremities with morning and evening care Every Shift   omeprazole 20 MG capsule Commonly known as: PRILOSEC Take 20 mg by mouth daily.   sertraline 25 MG tablet Commonly known as: ZOLOFT Take 12.5 mg by mouth daily.   timolol 0.5 % ophthalmic solution Commonly known as: TIMOPTIC SMARTSIG:In Eye(s)   zinc oxide 20 % ointment Apply 1 application topically as needed for irritation.        Review of Systems  Constitutional:  Negative for activity change, appetite change and fever.  HENT:  Positive for hearing loss. Negative for congestion, trouble swallowing and voice change.   Eyes:  Negative for visual disturbance.  Respiratory:  Negative for shortness of breath.   Cardiovascular:  Negative for leg swelling.  Gastrointestinal:  Negative for abdominal pain and constipation.  Genitourinary:  Positive for frequency. Negative for dysuria and urgency.  Musculoskeletal:  Positive for arthralgias and gait problem.  Skin:  Positive for wound. Negative for color change.  Neurological:  Negative for speech difficulty, weakness and headaches.       Memory lapses.   Psychiatric/Behavioral:  Positive for confusion. Negative for behavioral problems and sleep disturbance.    Immunization History  Administered Date(s) Administered   DTaP 09/05/2004   Influenza, High Dose Seasonal PF 01/06/2014, 11/08/2018   Influenza, Quadrivalent, Recombinant, Inj, Pf 01/09/2018, 01/21/2019   Influenza-Unspecified 11/07/2013, 01/14/2020   Moderna SARS-COV2 Booster Vaccination 02/22/2020, 09/06/2020   Moderna Sars-Covid-2 Vaccination 05/09/2019, 06/06/2019   Pneumococcal Conjugate-13 01/06/2014   Pneumococcal-Unspecified 09/05/2004, 10/18/2014   Pertinent  Health Maintenance  Due  Topic Date Due   INFLUENZA VACCINE  11/07/2020   DEXA SCAN  Completed   Fall Risk  05/19/2019 05/14/2019  Falls in the past year? 0 0  Number falls in past yr: 0 0  Injury with Fall? 0 -   Functional Status Survey:    Vitals:   01/26/21 0946  BP: 120/80  Pulse: 62  Resp: 18  Temp: (!) 97.3 F (36.3 C)  SpO2: 92%   There is no height or weight on file to calculate BMI. Physical Exam Vitals and nursing note reviewed.  Constitutional:      Appearance: Normal appearance.  HENT:  Head: Normocephalic and atraumatic.     Mouth/Throat:     Mouth: Mucous membranes are moist.  Eyes:     Extraocular Movements: Extraocular movements intact.     Conjunctiva/sclera: Conjunctivae normal.     Pupils: Pupils are equal, round, and reactive to light.  Cardiovascular:     Rate and Rhythm: Normal rate and regular rhythm.     Heart sounds: No murmur heard. Pulmonary:     Effort: Pulmonary effort is normal.     Breath sounds: No rales.  Abdominal:     General: Bowel sounds are normal.     Palpations: Abdomen is soft.     Tenderness: There is no abdominal tenderness.  Musculoskeletal:     Cervical back: Normal range of motion and neck supple.     Right lower leg: No edema.     Left lower leg: No edema.     Comments: able to walk/bear weight.   Skin:    General: Skin is warm and dry.     Findings: Erythema present.     Comments: Medial right lower leg skin laceration sutures x4 removed where the wound is healed, remaining sutures in the areas of questionable healing. Redness, warmth, tenderness, swelling in the laceration region. No calf tenderness palpated or with right foot dorsiflexion.   Neurological:     General: No focal deficit present.     Mental Status: She is alert and oriented to person, place, and time. Mental status is at baseline.     Motor: No weakness.     Coordination: Coordination normal.     Gait: Gait abnormal.  Psychiatric:        Mood and Affect: Mood  normal.        Behavior: Behavior normal.        Thought Content: Thought content normal.    Labs reviewed: Recent Labs    07/08/20 0000  NA 144  K 4.3  CL 111*  CO2 22  BUN 35*  CREATININE 0.9  CALCIUM 8.7   Recent Labs    07/08/20 0000  AST 35  ALT 27  ALKPHOS 81  ALBUMIN 3.8   Recent Labs    07/08/20 0000  WBC 5.5  NEUTROABS 3,586.00  HGB 14.0  HCT 44  PLT 222   Lab Results  Component Value Date   TSH 4.45 07/08/2020   Lab Results  Component Value Date   HGBA1C 5.9 (H) 11/04/2019   Lab Results  Component Value Date   CHOL 138 11/06/2019   HDL 49 11/06/2019   LDLCALC 72 11/06/2019   TRIG 86 11/06/2019   CHOLHDL 2.8 11/06/2019    Significant Diagnostic Results in last 30 days:  CT HEAD WO CONTRAST (5MM)  Result Date: 01/18/2021 CLINICAL DATA:  Minor head trauma with unwitnessed fall. EXAM: CT HEAD WITHOUT CONTRAST TECHNIQUE: Contiguous axial images were obtained from the base of the skull through the vertex without intravenous contrast. COMPARISON:  Head CT 01/09/2020 FINDINGS: Brain: No evidence of acute infarction, hemorrhage, hydrocephalus, extra-axial collection or mass lesion/mass effect. Generalized brain atrophy. Chronic small vessel ischemia in the hemispheric white matter. Vascular: No hyperdense vessel or unexpected calcification. Skull: Normal. Negative for fracture or focal lesion. Sinuses/Orbits: No acute finding. IMPRESSION: No evidence of intracranial injury. Electronically Signed   By: Jorje Guild M.D.   On: 01/18/2021 09:19    Assessment/Plan: Cellulitis of right anterior lower leg redness, warmth, swelling at the previous skin laceration site, sutures removed x4 where the  wound is well approximated, the rest of sutures remained due to still opened areas. Will treat with Doxy 122m bid x 7 days, may attempt remove the rest of sutures in 3-5 days if healed.   Tachycardia off Amiodarone 01/2020 in hospital due to prolonged QT  interval/syncope/fall due, started Metoprolol, continued Eliquis. F/u cardiology  GERD (gastroesophageal reflux disease) takes Omeprazole. Hgb 15.4 01/19/21  [place] mood is stable,  takes Sertraline.   CKD (chronic kidney disease) stage 3, GFR 30-59 ml/min (HCC)  Bun/creat 36/1.18 eGFR 42 01/19/21  [place], no behavioral issues. MMSE 20/30 10/28/19  Stroke ([facility] CVA (MRI cevebral small acute infarcts in the posterior left hemisphere in the left PCA and MCA watershed area. Takes Eliquis.   Anemia, iron deficiency 0/14/21 Iron 44, ferritin 63, Vit B12 695, Hgb 15.4 01/19/21    Family/ staff Communication: plan of care reviewed with the patient and charge nurse.   Labs/tests ordered:  none  Time spend 40 minutes.

## 2021-01-26 NOTE — Assessment & Plan Note (Signed)
redness, warmth, swelling at the previous skin laceration site, sutures removed x4 where the  wound is well approximated, the rest of sutures remained due to still opened areas. Will treat with Doxy 100mg  bid x 7 days, may attempt remove the rest of sutures in 3-5 days if healed.

## 2021-01-26 NOTE — Assessment & Plan Note (Signed)
0/14/21 Iron 44, ferritin 63, Vit B12 695, Hgb 15.4 01/19/21

## 2021-01-26 NOTE — Assessment & Plan Note (Signed)
Her mood is stable, takes Sertraline 

## 2021-01-26 NOTE — Assessment & Plan Note (Signed)
CVA (MRI cevebral small acute infarcts in the posterior left hemisphere in the left PCA and MCA watershed area. Takes Eliquis.

## 2021-01-26 NOTE — Assessment & Plan Note (Addendum)
takes Omeprazole. Hgb 15.4 01/19/21

## 2021-01-26 NOTE — Assessment & Plan Note (Signed)
off Amiodarone 01/2020 in hospital due to prolonged QT interval/syncope/fall due, started Metoprolol, continued Eliquis. F/u cardiology

## 2021-01-26 NOTE — Assessment & Plan Note (Signed)
Bun/creat 36/1.18 eGFR 42 01/19/21

## 2021-01-27 DIAGNOSIS — R296 Repeated falls: Secondary | ICD-10-CM | POA: Diagnosis not present

## 2021-01-27 DIAGNOSIS — R29898 Other symptoms and signs involving the musculoskeletal system: Secondary | ICD-10-CM | POA: Diagnosis not present

## 2021-01-27 DIAGNOSIS — M25562 Pain in left knee: Secondary | ICD-10-CM | POA: Diagnosis not present

## 2021-01-27 DIAGNOSIS — R2681 Unsteadiness on feet: Secondary | ICD-10-CM | POA: Diagnosis not present

## 2021-01-27 DIAGNOSIS — M6281 Muscle weakness (generalized): Secondary | ICD-10-CM | POA: Diagnosis not present

## 2021-01-30 DIAGNOSIS — R296 Repeated falls: Secondary | ICD-10-CM | POA: Diagnosis not present

## 2021-01-30 DIAGNOSIS — R2681 Unsteadiness on feet: Secondary | ICD-10-CM | POA: Diagnosis not present

## 2021-01-30 DIAGNOSIS — M6281 Muscle weakness (generalized): Secondary | ICD-10-CM | POA: Diagnosis not present

## 2021-01-30 DIAGNOSIS — R29898 Other symptoms and signs involving the musculoskeletal system: Secondary | ICD-10-CM | POA: Diagnosis not present

## 2021-01-30 DIAGNOSIS — M25562 Pain in left knee: Secondary | ICD-10-CM | POA: Diagnosis not present

## 2021-01-31 DIAGNOSIS — R2681 Unsteadiness on feet: Secondary | ICD-10-CM | POA: Diagnosis not present

## 2021-01-31 DIAGNOSIS — M25562 Pain in left knee: Secondary | ICD-10-CM | POA: Diagnosis not present

## 2021-01-31 DIAGNOSIS — R29898 Other symptoms and signs involving the musculoskeletal system: Secondary | ICD-10-CM | POA: Diagnosis not present

## 2021-01-31 DIAGNOSIS — R296 Repeated falls: Secondary | ICD-10-CM | POA: Diagnosis not present

## 2021-01-31 DIAGNOSIS — M6281 Muscle weakness (generalized): Secondary | ICD-10-CM | POA: Diagnosis not present

## 2021-02-01 ENCOUNTER — Encounter: Payer: Self-pay | Admitting: Cardiology

## 2021-02-01 ENCOUNTER — Ambulatory Visit: Payer: Medicare Other | Admitting: Cardiology

## 2021-02-01 ENCOUNTER — Other Ambulatory Visit: Payer: Self-pay

## 2021-02-01 VITALS — BP 131/44 | HR 58 | Resp 16 | Ht <= 58 in | Wt 107.2 lb

## 2021-02-01 DIAGNOSIS — E782 Mixed hyperlipidemia: Secondary | ICD-10-CM

## 2021-02-01 DIAGNOSIS — I471 Supraventricular tachycardia: Secondary | ICD-10-CM | POA: Diagnosis not present

## 2021-02-01 DIAGNOSIS — Z7901 Long term (current) use of anticoagulants: Secondary | ICD-10-CM

## 2021-02-01 DIAGNOSIS — I4892 Unspecified atrial flutter: Secondary | ICD-10-CM | POA: Diagnosis not present

## 2021-02-01 NOTE — Progress Notes (Signed)
Bianca [name] Date of Birth: 08-03-1922 MRN: [id number] Primary Care Provider:Gupta, Rene Kocher, MD Former Cardiology Providers: Dr. Adrian Prows Primary Cardiologist: Rex Kras, DO, [facility] (established care 02/05/2020)  Date: 02/01/21 Last Office Visit: 08/02/2020  Chief Complaint  Patient presents with   Paroxysmal SVT    Follow-up    HPI  Bianca [name] is a 87 y.o.  female who presents to the office with a chief complaint of " 38-month follow-up for atrial flutter." Patient's past medical history and cardiovascular risk factors include: Paroxysmal SVT, paroxysmal atrial flutter, hyperlipidemia, grade 2 diastolic dysfunction, cognitive impairment suggestive of possible age-related dementia, history of stroke, postmenopausal female, advanced age.  Initially seen in the office back in October 2021 when she presented status post fall and prolonged QT.  Given the prolonged QT her amiodarone was discontinued which she was taking for paroxysmal atrial flutter management.  Since then she has done well and continues to remain in normal sinus rhythm.  She is here for 54-month follow-up accompanied by her nephew [name] at today's visit.  She provided verbal consent with regards to discussing her medical information in her presence.  Patient is nephew notes that since last visit Ms. Randal has been experiencing falls.  These events have been not witnessed, no syncopal events, at times mechanical and at times the mechanism is unknown and she is found on the floor by the staff at her current residence [facility]).  During her last ER visit she did have a CT of the head performed given the fact that she is on oral anticoagulation and was negative for intracranial bleeding.  She is tolerating AV nodal blocking agents as well as Eliquis well for thromboembolic prophylaxis.  She does not endorse any evidence of bleeding.  ALLERGIES: Allergies  Allergen Reactions   Azopt [Brinzolamide] Other  (See Comments)    UNK   Brimonidine Other (See Comments)    UNK   Lumigan [Bimatoprost] Other (See Comments)    UNK   MEDICATION LIST PRIOR TO VISIT: Current Outpatient Medications on File Prior to Visit  Medication Sig Dispense Refill   acetaminophen (TYLENOL) 500 MG tablet Take 500 mg by mouth every 6 (six) hours as needed for mild pain or headache.     apixaban (ELIQUIS) 2.5 MG TABS tablet Take 1 tablet (2.5 mg total) by mouth 2 (two) times daily. 60 tablet 1   atorvastatin (LIPITOR) 20 MG tablet Take 1 tablet (20 mg total) by mouth daily at 6 PM. 30 tablet 1   b complex vitamins tablet Take 1 tablet by mouth daily.      bismuth subsalicylate (PEPTO BISMOL) 262 MG/15ML suspension Take 60 mLs by mouth daily as needed for indigestion or diarrhea or loose stools.      Calcium Carbonate-Vitamin D 600-400 MG-UNIT tablet Take 1 tablet by mouth daily.     doxycycline (VIBRA-TABS) 100 MG tablet Take 100 mg by mouth 2 (two) times daily.     lactose free nutrition (BOOST) LIQD Take 237 mLs by mouth daily.     melatonin 3 MG TABS tablet Take 3 mg by mouth at bedtime.      metoprolol tartrate (LOPRESSOR) 12.5 mg TABS tablet Take 12.5 mg by mouth daily as needed. Use PRN if HR more then 110 and SBP more then 100     metoprolol tartrate (LOPRESSOR) 25 MG tablet Take 0.5 tablets (12.5 mg total) by mouth 2 (two) times daily. 60 tablet 0   Multiple Vitamins-Minerals (  CENTRUM SILVER PO) Take 1 tablet by mouth daily. 7am-10am.     NON FORMULARY Moisturizing Cream to extremities with morning and evening care Every Shift     omeprazole (PRILOSEC) 20 MG capsule Take 20 mg by mouth daily.      saccharomyces boulardii (FLORASTOR) 250 MG capsule Take 250 mg by mouth 2 (two) times daily.     sertraline (ZOLOFT) 25 MG tablet Take 12.5 mg by mouth daily.     timolol (TIMOPTIC) 0.5 % ophthalmic solution SMARTSIG:In Eye(s)     zinc oxide 20 % ointment Apply 1 application topically as needed for irritation.     No  current facility-administered medications on file prior to visit.    PAST MEDICAL HISTORY: Past Medical History:  Diagnosis Date   Atrial flutter, paroxysmal ([place])    COVID-19    Dementia (HCC)    Diastolic dysfunction    Hyperlipidemia    Hypertension    PSVT (paroxysmal supraventricular tachycardia) (HCC)    Stroke ([place])    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 ([place])     PAST SURGICAL HISTORY: History reviewed. No pertinent surgical history.  FAMILY HISTORY: No family history of premature coronary disease or sudden cardiac death.   SOCIAL HISTORY:  The patient  reports that she has never smoked. She has never used smokeless tobacco. She reports that she does not drink alcohol and does not use drugs.  Review of Systems  Constitutional: Negative for chills and fever.  HENT:  Negative for hoarse voice and nosebleeds.   Eyes:  Negative for discharge, double vision and pain.  Cardiovascular:  Negative for chest pain, claudication, dyspnea on exertion, leg swelling, near-syncope, orthopnea, palpitations, paroxysmal nocturnal dyspnea and syncope.  Respiratory:  Negative for hemoptysis and shortness of breath.   Musculoskeletal:  Positive for joint pain. Negative for muscle cramps and myalgias.  Gastrointestinal:  Negative for abdominal pain, constipation, diarrhea, hematemesis, hematochezia, melena, nausea and vomiting.  Neurological:  Negative for dizziness and light-headedness.   PHYSICAL EXAM: Vitals with BMI 02/01/2021 01/26/2021 01/19/2021  Height 4\' 5"  - -  Weight 107 lbs 3 oz - -  BMI 78.67 - -  Systolic 672 094 709  Diastolic 44 80 58  Pulse 58 62 138    CONSTITUTIONAL: Elderly frail appearing female, present in wheelchair, hemodynamically stable, no acute distress.  SKIN: Skin is warm and dry. No rash noted. No cyanosis. No pallor. No jaundice HEAD: Normocephalic and atraumatic.  EYES: No scleral  icterus MOUTH/THROAT: Moist oral membranes.  NECK: No JVD present. No thyromegaly noted. No carotid bruits  LYMPHATIC: No visible cervical adenopathy.  CHEST Normal respiratory effort. No intercostal retractions  LUNGS: Clear to auscultation bilaterally. No stridor. No wheezes. No rales.  CARDIOVASCULAR: Regular rate and rhythm, positive S1-S2, positive G2-E3, holosystolic murmur heard at the left sternal border, no gallops or rubs ABDOMINAL: soft, nontender, nondistended, positive bowel sounds all 4 quadrants. No apparent ascites.  EXTREMITIES: No peripheral edema. Faint DP and PT.  HEMATOLOGIC: ecchymosis noted.  NEUROLOGIC: Oriented to person, place, and time. Nonfocal. Normal muscle tone.  PSYCHIATRIC: Normal mood and affect. Normal behavior. Cooperative  CARDIAC DATABASE: EKG: 02/01/2021: Normal sinus rhythm, 60 bpm, left axis deviation, poor R wave progression, without underlying injury pattern.  QT 424 ms.  Echocardiogram: 10/27/2019:  1. Left ventricular ejection fraction, by estimation, is 60 to 65%. The  left ventricle has normal function. The left ventricle has no regional  wall motion abnormalities.  There is mild left ventricular hypertrophy.  Left ventricular diastolic parameters  are consistent with Grade II diastolic dysfunction (pseudonormalization).  Elevated left atrial pressure.   2. Right ventricular systolic function is normal. The right ventricular  size is normal. There is mildly elevated pulmonary artery systolic  pressure. The estimated right ventricular systolic pressure is 09.4 mmHg.   3. Right atrial size was mildly dilated.   4. The mitral valve is normal in structure. Trivial mitral valve  regurgitation.   5. Tricuspid valve regurgitation is moderate to severe.   6. The aortic valve is tricuspid. Aortic valve regurgitation is mild.  Mild aortic valve sclerosis is present, with no evidence of aortic valve stenosis.   7. The inferior vena cava is normal in size  with greater than 50%  respiratory variability, suggesting right atrial pressure of 3 mmHg.    Carotid artery duplex  11/06/2019: Right Carotid: Velocities in the right ICA are consistent with a 1-39%  stenosis.  Left Carotid: Velocities in the left ICA are consistent with a 1-39%  stenosis.  Vertebrals: Bilateral vertebral arteries demonstrate antegrade flow.   LABORATORY DATA: CBC Latest Ref Rng & Units 07/08/2020 01/21/2020 01/11/2020  WBC - 5.5 6.0 7.5  Hemoglobin 12.0 - 16.0 14.0 10.7(A) 10.4(L)  Hematocrit 36 - 46 44 33(A) 32.9(L)  Platelets 150 - 399 222 261 281    CMP Latest Ref Rng & Units 07/08/2020 01/21/2020 01/11/2020  Glucose 70 - 99 mg/dL - - 91  BUN 4 - 21 35(A) 17 15  Creatinine 0.5 - 1.1 0.9 0.8 0.84  Sodium 137 - 147 144 143 142  Potassium 3.4 - 5.3 4.3 4.2 4.0  Chloride 99 - 108 111(A) 109(A) 109  CO2 13 - 22 22 26(A) 28  Calcium 8.7 - 10.7 8.7 8.1(A) 8.0(L)  Total Protein 6.5 - 8.1 g/dL - - -  Total Bilirubin 0.3 - 1.2 mg/dL - - -  Alkaline Phos 25 - 125 81 73 -  AST 13 - 35 35 22 -  ALT 7 - 35 27 19 -    Lipid Panel     Component Value Date/Time   CHOL 138 11/06/2019 0544   TRIG 86 11/06/2019 0544   HDL 49 11/06/2019 0544   CHOLHDL 2.8 11/06/2019 0544   VLDL 17 11/06/2019 0544   LDLCALC 72 11/06/2019 0544    Lab Results  Component Value Date   HGBA1C 5.9 (H) 11/04/2019   No components found for: NTPROBNP Lab Results  Component Value Date   TSH 4.45 07/08/2020   TSH 4.060 01/11/2020   TSH 1.629 11/02/2019    Cardiac Panel (last 3 results) No results for input(s): CKTOTAL, CKMB, TROPONINIHS, RELINDX in the last 72 hours.  IMPRESSION:    ICD-10-CM   1. Paroxysmal atrial flutter (HCC)  I48.92     2. Long term current use of anticoagulant  Z79.01     3. Paroxysmal SVT (supraventricular tachycardia) (HCC)  I47.1 EKG 12-Lead    4. Mixed hyperlipidemia  E78.2        RECOMMENDATIONS: Bianca [name] is a 87 y.o. female whose past medical  history and cardiovascular risk factors include:  Paroxysmal SVT, paroxysmal atrial flutter, hyperlipidemia, grade 2 diastolic dysfunction, cognitive impairment suggestive of possible age-related dementia, history of stroke, postmenopausal female, advanced age.  Paroxysmal atrial flutter: EKG independently reviewed: Normal sinus rhythm. Continue Lopressor 12.5 mg p.o. twice daily and Eliquis for thromboembolic prophylaxis. Given her falls had a long discussion with the  patient and her nephew Liliane Channel at today's visit with regards to the risks, benefits, and alternatives to oral anticoagulation. CHA2DS2-VASc SCORE is 5 which correlates to 6.7 % risk of stroke per year. Given her CHA2DS2-VASc score patient and her nephew would like to continue oral anticoagulation.  We did discuss alternatives such as watchman device; however, given her frailty and age that she may not be a candidate for this procedure. But if they are interested we will be more than happy to refer them to [facility] structural heart team.  Patient and her nephew would like to continue current therapy. Educated on the importance of preventing falls. They are also instructed to seek medical attention by going to the closest ED via EMS if she notices signs of bleeding or injured her head irrespective of the mechanism of injury.  They both verbalized understanding.   Long-term oral anticoagulation:  See above Indication paroxysmal atrial flutter.  Risks, benefits, alternatives to oral anticoagulation reemphasized.  Fall precautions reviewed.  Paroxysmal supraventricular tachycardia: Currently asymptomatic. Continue beta-blocker therapy.  Hyperlipidemia: Continue statin therapy.  No significant changes made during today's office encounter.  We will follow-up in 6 months or sooner if needed.  FINAL MEDICATION LIST END OF ENCOUNTER: No orders of the defined types were placed in this encounter.   Medications Discontinued During This  Encounter  Medication Reason   diphenoxylate-atropine (LOMOTIL) 2.5-0.025 MG tablet Error     Current Outpatient Medications:    acetaminophen (TYLENOL) 500 MG tablet, Take 500 mg by mouth every 6 (six) hours as needed for mild pain or headache., Disp: , Rfl:    apixaban (ELIQUIS) 2.5 MG TABS tablet, Take 1 tablet (2.5 mg total) by mouth 2 (two) times daily., Disp: 60 tablet, Rfl: 1   atorvastatin (LIPITOR) 20 MG tablet, Take 1 tablet (20 mg total) by mouth daily at 6 PM., Disp: 30 tablet, Rfl: 1   b complex vitamins tablet, Take 1 tablet by mouth daily. , Disp: , Rfl:    bismuth subsalicylate (PEPTO BISMOL) 262 MG/15ML suspension, Take 60 mLs by mouth daily as needed for indigestion or diarrhea or loose stools. , Disp: , Rfl:    Calcium Carbonate-Vitamin D 600-400 MG-UNIT tablet, Take 1 tablet by mouth daily., Disp: , Rfl:    doxycycline (VIBRA-TABS) 100 MG tablet, Take 100 mg by mouth 2 (two) times daily., Disp: , Rfl:    lactose free nutrition (BOOST) LIQD, Take 237 mLs by mouth daily., Disp: , Rfl:    melatonin 3 MG TABS tablet, Take 3 mg by mouth at bedtime. , Disp: , Rfl:    metoprolol tartrate (LOPRESSOR) 12.5 mg TABS tablet, Take 12.5 mg by mouth daily as needed. Use PRN if HR more then 110 and SBP more then 100, Disp: , Rfl:    metoprolol tartrate (LOPRESSOR) 25 MG tablet, Take 0.5 tablets (12.5 mg total) by mouth 2 (two) times daily., Disp: 60 tablet, Rfl: 0   Multiple Vitamins-Minerals (CENTRUM SILVER PO), Take 1 tablet by mouth daily. 7am-10am., Disp: , Rfl:    NON FORMULARY, Moisturizing Cream to extremities with morning and evening care Every Shift, Disp: , Rfl:    omeprazole (PRILOSEC) 20 MG capsule, Take 20 mg by mouth daily. , Disp: , Rfl:    saccharomyces boulardii (FLORASTOR) 250 MG capsule, Take 250 mg by mouth 2 (two) times daily., Disp: , Rfl:    sertraline (ZOLOFT) 25 MG tablet, Take 12.5 mg by mouth daily., Disp: , Rfl:    timolol (  TIMOPTIC) 0.5 % ophthalmic solution,  SMARTSIG:In Eye(s), Disp: , Rfl:    zinc oxide 20 % ointment, Apply 1 application topically as needed for irritation., Disp: , Rfl:   Orders Placed This Encounter  Procedures   EKG 12-Lead   --Continue cardiac medications as reconciled in final medication list. --Return in about 6 months (around 08/02/2021) for Follow up, Atrial flutter. Or sooner if needed. --Continue follow-up with your primary care physician regarding the management of your other chronic comorbid conditions.  Patient's questions and concerns were addressed to her satisfaction. She voices understanding of the instructions provided during this encounter.   This note was created using a voice recognition software as a result there may be grammatical errors inadvertently enclosed that do not reflect the nature of this encounter. Every attempt is made to correct such errors.  Rex Kras, [place], [facility]  Pager: [phone] Office: [phone]

## 2021-02-03 DIAGNOSIS — R29898 Other symptoms and signs involving the musculoskeletal system: Secondary | ICD-10-CM | POA: Diagnosis not present

## 2021-02-03 DIAGNOSIS — R2681 Unsteadiness on feet: Secondary | ICD-10-CM | POA: Diagnosis not present

## 2021-02-03 DIAGNOSIS — R296 Repeated falls: Secondary | ICD-10-CM | POA: Diagnosis not present

## 2021-02-03 DIAGNOSIS — M6281 Muscle weakness (generalized): Secondary | ICD-10-CM | POA: Diagnosis not present

## 2021-02-03 DIAGNOSIS — M25562 Pain in left knee: Secondary | ICD-10-CM | POA: Diagnosis not present

## 2021-02-06 DIAGNOSIS — R2681 Unsteadiness on feet: Secondary | ICD-10-CM | POA: Diagnosis not present

## 2021-02-06 DIAGNOSIS — R29898 Other symptoms and signs involving the musculoskeletal system: Secondary | ICD-10-CM | POA: Diagnosis not present

## 2021-02-06 DIAGNOSIS — M25562 Pain in left knee: Secondary | ICD-10-CM | POA: Diagnosis not present

## 2021-02-06 DIAGNOSIS — M6281 Muscle weakness (generalized): Secondary | ICD-10-CM | POA: Diagnosis not present

## 2021-02-06 DIAGNOSIS — R296 Repeated falls: Secondary | ICD-10-CM | POA: Diagnosis not present

## 2021-02-08 ENCOUNTER — Encounter: Payer: Self-pay | Admitting: Orthopedic Surgery

## 2021-02-08 ENCOUNTER — Non-Acute Institutional Stay: Payer: Medicare Other | Admitting: Orthopedic Surgery

## 2021-02-08 DIAGNOSIS — N1831 Chronic kidney disease, stage 3a: Secondary | ICD-10-CM

## 2021-02-08 DIAGNOSIS — I471 Supraventricular tachycardia: Secondary | ICD-10-CM | POA: Diagnosis not present

## 2021-02-08 DIAGNOSIS — I639 Cerebral infarction, unspecified: Secondary | ICD-10-CM

## 2021-02-08 DIAGNOSIS — K146 Glossodynia: Secondary | ICD-10-CM

## 2021-02-08 DIAGNOSIS — F339 Major depressive disorder, recurrent, unspecified: Secondary | ICD-10-CM

## 2021-02-08 DIAGNOSIS — F01B0 Vascular dementia, moderate, without behavioral disturbance, psychotic disturbance, mood disturbance, and anxiety: Secondary | ICD-10-CM | POA: Diagnosis not present

## 2021-02-08 DIAGNOSIS — K219 Gastro-esophageal reflux disease without esophagitis: Secondary | ICD-10-CM

## 2021-02-08 DIAGNOSIS — D509 Iron deficiency anemia, unspecified: Secondary | ICD-10-CM

## 2021-02-08 NOTE — Progress Notes (Signed)
Location:   [place] Room Number: 810 Place of Service:  ALF [phone]) Provider:  Ruthine Dose, MD  Patient Care Team: Virgie Dad, MD as PCP - General (Internal Medicine)  Extended Emergency Contact Information Primary Emergency Contact: [name] Mobile Phone: [phone] Relation: Nephew Secondary Emergency Contact: [name] Mobile Phone: [phone] Relation: Friend  Code Status:  DNR Goals of care: Advanced Directive information Advanced Directives 02/08/2021  Does Patient Have a Medical Advance Directive? Yes  Type of Paramedic of [place];Living will;Out of facility DNR (pink MOST or yellow form)  Does patient want to make changes to medical advance directive? No - Patient declined  Copy of [place] in Chart? Yes - validated most recent copy scanned in chart (See row information)  Would patient like information on creating a medical advance directive? -  Pre-existing out of facility DNR order (yellow form or pink MOST form) Yellow form placed in chart (order not valid for inpatient use)     Chief Complaint  Patient presents with   Acute Visit    Mouth sores   Health Maintenance    Tetanu/tdap,zoster, Pneumonia vaccine, COVID booster, flu vaccine    HPI:  Pt is a 87 y.o. female seen today for an acute visit for mouth sores.   Nursing staff reports sores on the right side of her tongue. She is a poor historian due to Alzheimer's dementia. She does recall biting her tongue, but does not remember when. She denies pain in her mouth or trouble eating. Nursing staff reports she is eating her meals like normal.   Paroxysmal SVT- HR elevated today, remains on metoprolol bid and prn for HR> 110, amiodarone intolerance prolonged QT intervals Dementia- no recent behavioral outbursts, continues to do well in AL CVA- remains on Eliquis, acute CVA with infarcts to posterior left  hemisphere 10/2019 GERD- hgb 10.7 07/2020, remains on Prilosec CKD 3a- BUN/creat 35/0.9 07/2020 Iron deficiency anemia- Iron 44, ferritin 63, B12 695 01/2020 Depression- remains on zoloft    Past Medical History:  Diagnosis Date   Atrial flutter, paroxysmal (HCC)    COVID-19    Dementia (HCC)    Diastolic dysfunction    Hyperlipidemia    Hypertension    PSVT (paroxysmal supraventricular tachycardia) (HCC)    Stroke ([place])    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 ([place])    No past surgical history on file.  Allergies  Allergen Reactions   Azopt [Brinzolamide] Other (See Comments)    UNK   Brimonidine Other (See Comments)    UNK   Lumigan [Bimatoprost] Other (See Comments)    UNK    Allergies as of 02/08/2021       Reactions   Azopt [brinzolamide] Other (See Comments)   UNK   Brimonidine Other (See Comments)   UNK   Lumigan [bimatoprost] Other (See Comments)   UNK        Medication List        Accurate as of February 08, 2021  4:22 PM. If you have any questions, ask your nurse or doctor.          STOP taking these medications    doxycycline 100 MG tablet Commonly known as: VIBRA-TABS Stopped by: Yvonna Alanis, NP   saccharomyces boulardii 250 MG capsule Commonly known as: FLORASTOR Stopped by: Yvonna Alanis, NP       TAKE these medications  acetaminophen 500 MG tablet Commonly known as: TYLENOL Take 500 mg by mouth every 6 (six) hours as needed for mild pain or headache.   apixaban 2.5 MG Tabs tablet Commonly known as: ELIQUIS Take 1 tablet (2.5 mg total) by mouth 2 (two) times daily.   atorvastatin 20 MG tablet Commonly known as: LIPITOR Take 1 tablet (20 mg total) by mouth daily at 6 PM.   b complex vitamins tablet Take 1 tablet by mouth daily.   bismuth subsalicylate 161 WR/60AV suspension Commonly known as: PEPTO BISMOL Take 60 mLs by mouth daily as needed for indigestion or  diarrhea or loose stools.   Calcium Carbonate-Vitamin D 600-400 MG-UNIT tablet Take 1 tablet by mouth daily.   CENTRUM SILVER PO Take 1 tablet by mouth daily. 7am-10am.   lactose free nutrition Liqd Take 237 mLs by mouth daily.   melatonin 3 MG Tabs tablet Take 3 mg by mouth at bedtime.   metoprolol tartrate 12.5 mg Tabs tablet Commonly known as: LOPRESSOR Take 12.5 mg by mouth daily as needed. Use PRN if HR more then 110 and SBP more then 100   metoprolol tartrate 25 MG tablet Commonly known as: LOPRESSOR Take 0.5 tablets (12.5 mg total) by mouth 2 (two) times daily.   NON FORMULARY Moisturizing Cream to extremities with morning and evening care Every Shift   omeprazole 20 MG capsule Commonly known as: PRILOSEC Take 20 mg by mouth daily.   sertraline 25 MG tablet Commonly known as: ZOLOFT Take 12.5 mg by mouth daily.   timolol 0.5 % ophthalmic solution Commonly known as: TIMOPTIC SMARTSIG:In Eye(s)   zinc oxide 20 % ointment Apply 1 application topically as needed for irritation.        Review of Systems  Unable to perform ROS: Dementia   Immunization History  Administered Date(s) Administered   DTaP 09/05/2004   Influenza, High Dose Seasonal PF 01/06/2014, 11/08/2018   Influenza, Quadrivalent, Recombinant, Inj, Pf 01/09/2018, 01/21/2019   Influenza-Unspecified 11/07/2013, 01/14/2020   Moderna SARS-COV2 Booster Vaccination 02/22/2020, 09/06/2020   Moderna Sars-Covid-2 Vaccination 05/09/2019, 06/06/2019   Pneumococcal Conjugate-13 01/06/2014   Pneumococcal-Unspecified 09/05/2004, 10/18/2014   Pertinent  Health Maintenance Due  Topic Date Due   INFLUENZA VACCINE  11/07/2020   DEXA SCAN  Completed   Fall Risk 01/10/2020 01/11/2020 01/11/2020 01/12/2020 01/18/2021  Falls in the past year? - - - - -  Was there an injury with Fall? - - - - -  Fall Risk Category Calculator - - - - -  Fall Risk Category - - - - -  Patient Fall Risk Level High fall risk High  fall risk High fall risk High fall risk Low fall risk   Functional Status Survey:    Vitals:   02/08/21 1606  BP: 98/64  Pulse: (!) 142  Resp: 18  Temp: (!) 97.3 F (36.3 C)  SpO2: 97%  Weight: 106 lb (48.1 kg)  Height: 4\' 5"  (1.346 m)   Body mass index is 26.53 kg/m. Physical Exam Vitals reviewed.  Constitutional:      General: She is not in acute distress. HENT:     Head: Normocephalic.     Mouth/Throat:     Lips: No lesions.     Mouth: Mucous membranes are moist. Oral lesions present.     Tongue: Lesions present.     Palate: No lesions.     Comments: 2 small lesions on right side on tongue, white/red, circular shape, one small lesion to right inner  mouth white in color Eyes:     General:        Right eye: No discharge.        Left eye: No discharge.  Neck:     Vascular: No carotid bruit.  Cardiovascular:     Rate and Rhythm: Tachycardia present.     Pulses: Normal pulses.     Heart sounds: Normal heart sounds. No murmur heard. Pulmonary:     Effort: Pulmonary effort is normal. No respiratory distress.     Breath sounds: Normal breath sounds. No wheezing.  Abdominal:     General: Abdomen is flat. Bowel sounds are normal. There is no distension.     Palpations: Abdomen is soft.     Tenderness: There is no abdominal tenderness.  Musculoskeletal:     Cervical back: Normal range of motion.  Lymphadenopathy:     Cervical: No cervical adenopathy.  Neurological:     Mental Status: She is alert.    Labs reviewed: Recent Labs    07/08/20 0000  NA 144  K 4.3  CL 111*  CO2 22  BUN 35*  CREATININE 0.9  CALCIUM 8.7   Recent Labs    07/08/20 0000  AST 35  ALT 27  ALKPHOS 81  ALBUMIN 3.8   Recent Labs    07/08/20 0000  WBC 5.5  NEUTROABS 3,586.00  HGB 14.0  HCT 44  PLT 222   Lab Results  Component Value Date   TSH 4.45 07/08/2020   Lab Results  Component Value Date   HGBA1C 5.9 (H) 11/04/2019   Lab Results  Component Value Date   CHOL  138 11/06/2019   HDL 49 11/06/2019   LDLCALC 72 11/06/2019   TRIG 86 11/06/2019   CHOLHDL 2.8 11/06/2019    Significant Diagnostic Results in last 30 days:  CT HEAD WO CONTRAST (5MM)  Result Date: 01/18/2021 CLINICAL DATA:  Minor head trauma with unwitnessed fall. EXAM: CT HEAD WITHOUT CONTRAST TECHNIQUE: Contiguous axial images were obtained from the base of the skull through the vertex without intravenous contrast. COMPARISON:  Head CT 01/09/2020 FINDINGS: Brain: No evidence of acute infarction, hemorrhage, hydrocephalus, extra-axial collection or mass lesion/mass effect. Generalized brain atrophy. Chronic small vessel ischemia in the hemispheric white matter. Vascular: No hyperdense vessel or unexpected calcification. Skull: Normal. Negative for fracture or focal lesion. Sinuses/Orbits: No acute finding. IMPRESSION: No evidence of intracranial injury. Electronically Signed   By: Jorje Guild M.D.   On: 01/18/2021 09:19    Assessment/Plan 1. Tongue sore - small lesions to right side of tongue and inner cheek - denies pain - start Orajel 3x- apply to right tongue/ inner mouth TID prn for pain  2. Paroxysmal SVT (supraventricular tachycardia) (HCC) - elevated today - cont metoprolol bid and prn for HR>100  3. Moderate vascular dementia without behavioral disturbance, psychotic disturbance, mood disturbance, or anxiety - no recent behavioral outbursts - continues to do well in AL  4. Cerebrovascular accident (CVA), unspecified mechanism ([place]) - cont statin and Eliquis  5. Gastroesophageal reflux disease, unspecified whether esophagitis present - hgb stable - cont Prilosec  6. Stage 3a chronic kidney disease (HCC) - cont to avoid nephrotoxic drugs like NSAIDS and dose adjust medications to be renally excreted  7. Iron deficiency anemia, unspecified iron deficiency anemia type - hgb 10.7 07/2020  8. Depression, recurrent ([place]) - cont zoloft    Family/ staff Communication:  plan discussed with nurse  Labs/tests ordered:   none

## 2021-02-09 ENCOUNTER — Encounter: Payer: Self-pay | Admitting: Orthopedic Surgery

## 2021-02-09 DIAGNOSIS — M25562 Pain in left knee: Secondary | ICD-10-CM | POA: Diagnosis not present

## 2021-02-09 DIAGNOSIS — R2681 Unsteadiness on feet: Secondary | ICD-10-CM | POA: Diagnosis not present

## 2021-02-09 DIAGNOSIS — R296 Repeated falls: Secondary | ICD-10-CM | POA: Diagnosis not present

## 2021-02-09 DIAGNOSIS — M6281 Muscle weakness (generalized): Secondary | ICD-10-CM | POA: Diagnosis not present

## 2021-02-09 DIAGNOSIS — R29898 Other symptoms and signs involving the musculoskeletal system: Secondary | ICD-10-CM | POA: Diagnosis not present

## 2021-02-10 DIAGNOSIS — M6281 Muscle weakness (generalized): Secondary | ICD-10-CM | POA: Diagnosis not present

## 2021-02-10 DIAGNOSIS — M25562 Pain in left knee: Secondary | ICD-10-CM | POA: Diagnosis not present

## 2021-02-10 DIAGNOSIS — R2681 Unsteadiness on feet: Secondary | ICD-10-CM | POA: Diagnosis not present

## 2021-02-10 DIAGNOSIS — R29898 Other symptoms and signs involving the musculoskeletal system: Secondary | ICD-10-CM | POA: Diagnosis not present

## 2021-02-10 DIAGNOSIS — R296 Repeated falls: Secondary | ICD-10-CM | POA: Diagnosis not present

## 2021-02-14 DIAGNOSIS — R29898 Other symptoms and signs involving the musculoskeletal system: Secondary | ICD-10-CM | POA: Diagnosis not present

## 2021-02-14 DIAGNOSIS — R2681 Unsteadiness on feet: Secondary | ICD-10-CM | POA: Diagnosis not present

## 2021-02-14 DIAGNOSIS — M6281 Muscle weakness (generalized): Secondary | ICD-10-CM | POA: Diagnosis not present

## 2021-02-14 DIAGNOSIS — M25562 Pain in left knee: Secondary | ICD-10-CM | POA: Diagnosis not present

## 2021-02-14 DIAGNOSIS — R296 Repeated falls: Secondary | ICD-10-CM | POA: Diagnosis not present

## 2021-02-16 DIAGNOSIS — R296 Repeated falls: Secondary | ICD-10-CM | POA: Diagnosis not present

## 2021-02-16 DIAGNOSIS — G4700 Insomnia, unspecified: Secondary | ICD-10-CM | POA: Diagnosis not present

## 2021-02-16 DIAGNOSIS — M25562 Pain in left knee: Secondary | ICD-10-CM | POA: Diagnosis not present

## 2021-02-16 DIAGNOSIS — M6281 Muscle weakness (generalized): Secondary | ICD-10-CM | POA: Diagnosis not present

## 2021-02-16 DIAGNOSIS — R2681 Unsteadiness on feet: Secondary | ICD-10-CM | POA: Diagnosis not present

## 2021-02-16 DIAGNOSIS — R29898 Other symptoms and signs involving the musculoskeletal system: Secondary | ICD-10-CM | POA: Diagnosis not present

## 2021-02-16 DIAGNOSIS — F330 Major depressive disorder, recurrent, mild: Secondary | ICD-10-CM | POA: Diagnosis not present

## 2021-02-17 ENCOUNTER — Non-Acute Institutional Stay: Payer: Medicare Other | Admitting: Nurse Practitioner

## 2021-02-17 ENCOUNTER — Encounter: Payer: Self-pay | Admitting: Nurse Practitioner

## 2021-02-17 DIAGNOSIS — F339 Major depressive disorder, recurrent, unspecified: Secondary | ICD-10-CM | POA: Diagnosis not present

## 2021-02-17 DIAGNOSIS — R2681 Unsteadiness on feet: Secondary | ICD-10-CM | POA: Diagnosis not present

## 2021-02-17 DIAGNOSIS — T148XXD Other injury of unspecified body region, subsequent encounter: Secondary | ICD-10-CM | POA: Diagnosis not present

## 2021-02-17 DIAGNOSIS — R29898 Other symptoms and signs involving the musculoskeletal system: Secondary | ICD-10-CM | POA: Diagnosis not present

## 2021-02-17 DIAGNOSIS — R609 Edema, unspecified: Secondary | ICD-10-CM

## 2021-02-17 DIAGNOSIS — N1831 Chronic kidney disease, stage 3a: Secondary | ICD-10-CM | POA: Diagnosis not present

## 2021-02-17 DIAGNOSIS — F01B0 Vascular dementia, moderate, without behavioral disturbance, psychotic disturbance, mood disturbance, and anxiety: Secondary | ICD-10-CM | POA: Diagnosis not present

## 2021-02-17 DIAGNOSIS — K219 Gastro-esophageal reflux disease without esophagitis: Secondary | ICD-10-CM | POA: Diagnosis not present

## 2021-02-17 DIAGNOSIS — R296 Repeated falls: Secondary | ICD-10-CM | POA: Diagnosis not present

## 2021-02-17 DIAGNOSIS — R000 Tachycardia, unspecified: Secondary | ICD-10-CM | POA: Diagnosis not present

## 2021-02-17 DIAGNOSIS — D509 Iron deficiency anemia, unspecified: Secondary | ICD-10-CM

## 2021-02-17 DIAGNOSIS — I639 Cerebral infarction, unspecified: Secondary | ICD-10-CM | POA: Diagnosis not present

## 2021-02-17 DIAGNOSIS — M25562 Pain in left knee: Secondary | ICD-10-CM | POA: Diagnosis not present

## 2021-02-17 DIAGNOSIS — M6281 Muscle weakness (generalized): Secondary | ICD-10-CM | POA: Diagnosis not present

## 2021-02-17 NOTE — Assessment & Plan Note (Signed)
not apparent, off Torsemide.  ?

## 2021-02-17 NOTE — Assessment & Plan Note (Signed)
Bun/creat 36/1.18 eGFR 42 01/19/21 

## 2021-02-17 NOTE — Assessment & Plan Note (Signed)
ED eval 01/18/21, fall, medial right lower leg laceration, suture removed, wound edge rolled in center of the laceration, mild erythema, warmth, and swelling. Will apply Bactroban oint the wound, secondary intention healing, observe.

## 2021-02-17 NOTE — Assessment & Plan Note (Signed)
01/21/20 Iron 44, ferritin 63, Vit B12 695, Hgb 15.4 01/19/21

## 2021-02-17 NOTE — Assessment & Plan Note (Signed)
Vascular dementia since CVA, no behavioral issues. MMSE 20/30 10/28/19

## 2021-02-17 NOTE — Assessment & Plan Note (Signed)
PSVT off Amiodarone 01/2020 in hospital due to prolonged QT interval. On Metoprolol, prn if HR >100, on Eliquis.

## 2021-02-17 NOTE — Progress Notes (Signed)
Location:   [place] Room Number: 803 Place of Service:  ALF (13) Provider: Lennie Odor Shanda Cadotte NP  Virgie Dad, MD  Patient Care Team: Virgie Dad, MD as PCP - General (Internal Medicine)  Extended Emergency Contact Information Primary Emergency Contact: [name] Mobile Phone: [phone] Relation: Nephew Secondary Emergency Contact: [name] Mobile Phone: [phone] Relation: Friend  Code Status:  DNR Goals of care: Advanced Directive information Advanced Directives 02/17/2021  Does Patient Have a Medical Advance Directive? Yes  Type of Paramedic of [place];Living will;Out of facility DNR (pink MOST or yellow form)  Does patient want to make changes to medical advance directive? No - Patient declined  Copy of [place] in Chart? Yes - validated most recent copy scanned in chart (See row information)  Would patient like information on creating a medical advance directive? -  Pre-existing out of facility DNR order (yellow form or pink MOST form) Yellow form placed in chart (order not valid for inpatient use)     Chief Complaint  Patient presents with   Medical Management of Chronic Issues   Quality Metric Gaps    TDAP, Shingrix, PPSV23, #5 covid    HPI:  Pt is a 87 y.o. female seen today for medical management of chronic diseases.     ED eval 01/18/21, fall, medial right lower leg laceration, suture removed, wound edge rolled in center of the laceration, mild erythema, warmth, and swelling.   Tongue sore, resolved.              Edema, not apparent,  off Torsemide.              PSVT off Amiodarone 01/2020 in hospital due to prolonged QT interval. On Metoprolol, prn if HR >100, on Eliquis.              GERD, takes Omeprazole. Hgb 15.4 01/19/21             Depression, takes Sertraline.              CKD Bun/creat 36/1.18 eGFR 42 01/19/21             Vascular dementia since CVA, no behavioral issues. MMSE  20/30 10/28/19             11/02/19 CVA (MRI cevebral small acute infarcts in the posterior left hemisphere in the left PCA and MCA watershed area. Takes Eliquis.              Anemia, 01/21/20 Iron 44, ferritin 63, Vit B12 695, Hgb 15.4 01/19/21  Past Medical History:  Diagnosis Date   Atrial flutter, paroxysmal (HCC)    COVID-19    Dementia (HCC)    Diastolic dysfunction    Hyperlipidemia    Hypertension    PSVT (paroxysmal supraventricular tachycardia) ([place])    Stroke ([place])    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 ([place])    History reviewed. No pertinent surgical history.  Allergies  Allergen Reactions   Azopt [Brinzolamide] Other (See Comments)    UNK   Brimonidine Other (See Comments)    UNK   Lumigan [Bimatoprost] Other (See Comments)    UNK    Allergies as of 02/17/2021       Reactions   Azopt [brinzolamide] Other (See Comments)   UNK   Brimonidine Other (See Comments)   UNK   Lumigan [bimatoprost] Other (See Comments)   UNK  Medication List        Accurate as of February 17, 2021 11:59 PM. If you have any questions, ask your nurse or doctor.          acetaminophen 325 MG tablet Commonly known as: TYLENOL Take 650 mg by mouth every 6 (six) hours as needed. What changed: Another medication with the same name was removed. Continue taking this medication, and follow the directions you see here. Changed by: Taequan Stockhausen X Josephina Melcher, NP   apixaban 2.5 MG Tabs tablet Commonly known as: ELIQUIS Take 1 tablet (2.5 mg total) by mouth 2 (two) times daily.   atorvastatin 20 MG tablet Commonly known as: LIPITOR Take 1 tablet (20 mg total) by mouth daily at 6 PM.   b complex vitamins tablet Take 1 tablet by mouth daily.   bismuth subsalicylate 037 CW/88QB suspension Commonly known as: PEPTO BISMOL Take 60 mLs by mouth daily as needed for indigestion or diarrhea or loose stools.   Calcium Carbonate-Vitamin D  600-400 MG-UNIT tablet Take 1 tablet by mouth daily.   CENTRUM SILVER PO Take 1 tablet by mouth daily. 7am-10am.   lactose free nutrition Liqd Take 237 mLs by mouth daily.   melatonin 3 MG Tabs tablet Take 3 mg by mouth at bedtime.   metoprolol tartrate 12.5 mg Tabs tablet Commonly known as: LOPRESSOR Take 12.5 mg by mouth daily as needed. Use PRN if HR more then 110 and SBP more then 100   metoprolol tartrate 25 MG tablet Commonly known as: LOPRESSOR Take 0.5 tablets (12.5 mg total) by mouth 2 (two) times daily.   NON FORMULARY Moisturizing Cream to extremities with morning and evening care Every Shift   omeprazole 20 MG capsule Commonly known as: PRILOSEC Take 20 mg by mouth daily.   ORAJEL MT Use as directed in the mouth or throat 3 (three) times daily. Apply to RIGHT tongue/Inner mouth for pain   sertraline 25 MG tablet Commonly known as: ZOLOFT Take 12.5 mg by mouth daily.   timolol 0.5 % ophthalmic solution Commonly known as: TIMOPTIC SMARTSIG:In Eye(s)   zinc oxide 20 % ointment Apply 1 application topically as needed for irritation.        Review of Systems  Constitutional:  Negative for activity change, appetite change and fever.  HENT:  Positive for hearing loss. Negative for congestion, mouth sores, trouble swallowing and voice change.   Eyes:  Negative for visual disturbance.  Respiratory:  Negative for shortness of breath.   Cardiovascular:  Positive for leg swelling.  Gastrointestinal:  Negative for abdominal pain and constipation.  Genitourinary:  Positive for frequency. Negative for dysuria and urgency.  Musculoskeletal:  Positive for arthralgias and gait problem.  Skin:  Positive for wound.  Neurological:  Negative for speech difficulty, weakness and headaches.       Memory lapses.   Psychiatric/Behavioral:  Positive for confusion. Negative for behavioral problems and sleep disturbance.    Immunization History  Administered Date(s)  Administered   DTaP 09/05/2004   Influenza, High Dose Seasonal PF 01/06/2014, 11/08/2018   Influenza, Quadrivalent, Recombinant, Inj, Pf 01/09/2018, 01/21/2019   Influenza-Unspecified 11/07/2013, 01/14/2020, 01/26/2021   Moderna SARS-COV2 Booster Vaccination 02/22/2020, 09/06/2020   Moderna Sars-Covid-2 Vaccination 05/09/2019, 06/06/2019   Pneumococcal Conjugate-13 01/06/2014   Pneumococcal-Unspecified 09/05/2004, 10/18/2014   Pertinent  Health Maintenance Due  Topic Date Due   INFLUENZA VACCINE  Completed   DEXA SCAN  Completed   Fall Risk 01/10/2020 01/11/2020 01/11/2020 01/12/2020 01/18/2021  Falls in the  past year? - - - - -  Was there an injury with Fall? - - - - -  Fall Risk Category Calculator - - - - -  Fall Risk Category - - - - -  Patient Fall Risk Level High fall risk High fall risk High fall risk High fall risk Low fall risk   Functional Status Survey:    Vitals:   02/17/21 1510  BP: 120/60  Pulse: 68  Resp: 18  Temp: 98.3 F (36.8 C)  SpO2: 97%  Weight: 106 lb (48.1 kg)  Height: '4\' 5"'  (1.346 m)   Body mass index is 26.53 kg/m. Physical Exam Vitals and nursing note reviewed.  Constitutional:      Appearance: Normal appearance.  HENT:     Head: Normocephalic and atraumatic.     Nose: Nose normal.     Mouth/Throat:     Mouth: Mucous membranes are moist.     Pharynx: No oropharyngeal exudate or posterior oropharyngeal erythema.  Eyes:     Extraocular Movements: Extraocular movements intact.     Conjunctiva/sclera: Conjunctivae normal.     Pupils: Pupils are equal, round, and reactive to light.  Cardiovascular:     Rate and Rhythm: Regular rhythm. Tachycardia present.     Heart sounds: No murmur heard. Pulmonary:     Effort: Pulmonary effort is normal.     Breath sounds: No rales.  Abdominal:     General: Bowel sounds are normal.     Palpations: Abdomen is soft.     Tenderness: There is no abdominal tenderness.  Musculoskeletal:     Cervical back:  Normal range of motion and neck supple.     Right lower leg: Edema present.     Left lower leg: No edema.     Comments: able to walk/bear weight. Mild swelling RLE in the delayed healing wound region.   Skin:    General: Skin is warm and dry.     Findings: Erythema present.     Comments: The medial right lower leg laceration, suture removed, wound edge rolled in center of the laceration, mild erythema, warmth, and swelling.    Neurological:     General: No focal deficit present.     Mental Status: She is alert and oriented to person, place, and time. Mental status is at baseline.     Motor: No weakness.     Coordination: Coordination normal.     Gait: Gait abnormal.  Psychiatric:        Mood and Affect: Mood normal.        Behavior: Behavior normal.        Thought Content: Thought content normal.    Labs reviewed: Recent Labs    07/08/20 0000 01/19/21 0000  NA 144 135*  K 4.3 4.5  CL 111* 105  CO2 22 18  BUN 35* 36*  CREATININE 0.9 1.2*  CALCIUM 8.7 8.7   Recent Labs    07/08/20 0000 01/19/21 0000  AST 35 33  ALT 27 26  ALKPHOS 81 86  ALBUMIN 3.8 4.1   Recent Labs    07/08/20 0000 01/19/21 0000  WBC 5.5 9.2  NEUTROABS 3,586.00 7,121.00  HGB 14.0 15.4  HCT 44 46  PLT 222 250   Lab Results  Component Value Date   TSH 4.45 07/08/2020   Lab Results  Component Value Date   HGBA1C 5.9 (H) 11/04/2019   Lab Results  Component Value Date   CHOL 138 11/06/2019   HDL  49 11/06/2019   LDLCALC 72 11/06/2019   TRIG 86 11/06/2019   CHOLHDL 2.8 11/06/2019    Significant Diagnostic Results in last 30 days:  No results found.  Assessment/Plan  Edema not apparent,  off Torsemide.   Tachycardia PSVT off Amiodarone 01/2020 in hospital due to prolonged QT interval. On Metoprolol, prn if HR >100, on Eliquis.   GERD (gastroesophageal reflux disease) Stable, takes Omeprazole. Hgb 15.4 01/19/21  Depression, recurrent ([place]) Her mood is stable, takes  Sertraline.   CKD (chronic kidney disease) stage 3, GFR 30-59 ml/min (HCC) Bun/creat 36/1.18 eGFR 42 01/19/21  Vascular dementia [facility]) Vascular dementia since CVA, no behavioral issues. MMSE 20/30 10/28/19  Stroke ([place]) 11/02/19 CVA (MRI cevebral small acute infarcts in the posterior left hemisphere in the left PCA and MCA watershed area. Takes Eliquis.   Anemia, iron deficiency 01/21/20 Iron 44, ferritin 63, Vit B12 695, Hgb 15.4 01/19/21  Delayed wound healing ED eval 01/18/21, fall, medial right lower leg laceration, suture removed, wound edge rolled in center of the laceration, mild erythema, warmth, and swelling. Will apply Bactroban oint the wound, secondary intention healing, observe.    Family/ staff Communication: plan of care reviewed with the patient and charge nurse.   Labs/tests ordered:  none  Time spend 40 minutes.

## 2021-02-17 NOTE — Assessment & Plan Note (Signed)
11/02/19 CVA (MRI cevebral small acute infarcts in the posterior left hemisphere in the left PCA and MCA watershed area. Takes Eliquis.  

## 2021-02-17 NOTE — Assessment & Plan Note (Signed)
Stable, takes Omeprazole. Hgb 15.4 01/19/21

## 2021-02-17 NOTE — Assessment & Plan Note (Signed)
Her mood is stable, takes Sertraline 

## 2021-02-20 ENCOUNTER — Encounter: Payer: Self-pay | Admitting: Nurse Practitioner

## 2021-02-21 DIAGNOSIS — M6281 Muscle weakness (generalized): Secondary | ICD-10-CM | POA: Diagnosis not present

## 2021-02-21 DIAGNOSIS — M25562 Pain in left knee: Secondary | ICD-10-CM | POA: Diagnosis not present

## 2021-02-21 DIAGNOSIS — R2681 Unsteadiness on feet: Secondary | ICD-10-CM | POA: Diagnosis not present

## 2021-02-21 DIAGNOSIS — R29898 Other symptoms and signs involving the musculoskeletal system: Secondary | ICD-10-CM | POA: Diagnosis not present

## 2021-02-21 DIAGNOSIS — R296 Repeated falls: Secondary | ICD-10-CM | POA: Diagnosis not present

## 2021-02-23 DIAGNOSIS — M25562 Pain in left knee: Secondary | ICD-10-CM | POA: Diagnosis not present

## 2021-02-23 DIAGNOSIS — R2681 Unsteadiness on feet: Secondary | ICD-10-CM | POA: Diagnosis not present

## 2021-02-23 DIAGNOSIS — R29898 Other symptoms and signs involving the musculoskeletal system: Secondary | ICD-10-CM | POA: Diagnosis not present

## 2021-02-23 DIAGNOSIS — M6281 Muscle weakness (generalized): Secondary | ICD-10-CM | POA: Diagnosis not present

## 2021-02-23 DIAGNOSIS — R296 Repeated falls: Secondary | ICD-10-CM | POA: Diagnosis not present

## 2021-02-24 ENCOUNTER — Non-Acute Institutional Stay: Payer: Medicare Other | Admitting: Nurse Practitioner

## 2021-02-24 DIAGNOSIS — R609 Edema, unspecified: Secondary | ICD-10-CM

## 2021-02-24 DIAGNOSIS — R2681 Unsteadiness on feet: Secondary | ICD-10-CM | POA: Diagnosis not present

## 2021-02-24 DIAGNOSIS — R29898 Other symptoms and signs involving the musculoskeletal system: Secondary | ICD-10-CM | POA: Diagnosis not present

## 2021-02-24 DIAGNOSIS — N1831 Chronic kidney disease, stage 3a: Secondary | ICD-10-CM | POA: Diagnosis not present

## 2021-02-24 DIAGNOSIS — M25562 Pain in left knee: Secondary | ICD-10-CM | POA: Diagnosis not present

## 2021-02-24 DIAGNOSIS — R296 Repeated falls: Secondary | ICD-10-CM | POA: Diagnosis not present

## 2021-02-24 DIAGNOSIS — D509 Iron deficiency anemia, unspecified: Secondary | ICD-10-CM

## 2021-02-24 DIAGNOSIS — K219 Gastro-esophageal reflux disease without esophagitis: Secondary | ICD-10-CM | POA: Diagnosis not present

## 2021-02-24 DIAGNOSIS — F339 Major depressive disorder, recurrent, unspecified: Secondary | ICD-10-CM | POA: Diagnosis not present

## 2021-02-24 DIAGNOSIS — F01B0 Vascular dementia, moderate, without behavioral disturbance, psychotic disturbance, mood disturbance, and anxiety: Secondary | ICD-10-CM | POA: Diagnosis not present

## 2021-02-24 DIAGNOSIS — I639 Cerebral infarction, unspecified: Secondary | ICD-10-CM | POA: Diagnosis not present

## 2021-02-24 DIAGNOSIS — R000 Tachycardia, unspecified: Secondary | ICD-10-CM | POA: Diagnosis not present

## 2021-02-24 DIAGNOSIS — T148XXD Other injury of unspecified body region, subsequent encounter: Secondary | ICD-10-CM

## 2021-02-24 DIAGNOSIS — M6281 Muscle weakness (generalized): Secondary | ICD-10-CM | POA: Diagnosis not present

## 2021-02-24 NOTE — Assessment & Plan Note (Signed)
slow healing traumatic skin laceration, reported still redness, tenderness, and drainage, currently treated with Bactroban topically. Overall improved except a small tunneling wound

## 2021-02-24 NOTE — Progress Notes (Signed)
Location: [facility] [place] Room Number: 638 A Place of Service:  ALF (13) Provider: Lennie Odor Laneta Guerin NP  Virgie Dad, MD  Patient Care Team: Virgie Dad, MD as PCP - General (Internal Medicine)  Extended Emergency Contact Information Primary Emergency Contact: [name] Mobile Phone: [phone] Relation: Nephew Secondary Emergency Contact: [name] Mobile Phone: [phone] Relation: Friend  Code Status: DNR Goals of care: Advanced Directive information Advanced Directives 02/24/2021  Does Patient Have a Medical Advance Directive? Yes  Type of Paramedic of [place];Living will;Out of facility DNR (pink MOST or yellow form)  Does patient want to make changes to medical advance directive? No - Patient declined  Copy of [place] in Chart? Yes - validated most recent copy scanned in chart (See row information)  Would patient like information on creating a medical advance directive? -  Pre-existing out of facility DNR order (yellow form or pink MOST form) Yellow form placed in chart (order not valid for inpatient use)     Chief Complaint  Patient presents with   Acute Visit    Acute visit for Left leg wound     HPI:  Pt is a 87 y.o. female seen today for an acute visit for slow healing traumatic skin laceration, reported still redness, tenderness, and drainage, currently treated with Bactroban topically.     ED eval 01/18/21, fall, medial right lower leg laceration, suture removed             Edema, not apparent except LLE with open wound, off Torsemide.              PSVT off Amiodarone 01/2020 in hospital due to prolonged QT interval. On Metoprolol, prn if HR >100, on Eliquis.              GERD, takes Omeprazole. Hgb 15.4 01/19/21             Depression, takes Sertraline.              CKD Bun/creat 36/1.18 eGFR 42 01/19/21             Vascular dementia since CVA, no behavioral issues. MMSE 20/30 10/28/19              11/02/19 CVA (MRI cevebral small acute infarcts in the posterior left hemisphere in the left PCA and MCA watershed area. Takes Eliquis.              Anemia, 01/21/20 Iron 44, ferritin 63, Vit B12 695, Hgb 15.4 01/19/21  Past Medical History:  Diagnosis Date   Atrial flutter, paroxysmal (HCC)    COVID-19    Dementia (HCC)    Diastolic dysfunction    Hyperlipidemia    Hypertension    PSVT (paroxysmal supraventricular tachycardia) ([place])    Stroke ([place])    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 ([place])    History reviewed. No pertinent surgical history.  Allergies  Allergen Reactions   Azopt [Brinzolamide] Other (See Comments)    UNK   Brimonidine Other (See Comments)    UNK   Lumigan [Bimatoprost] Other (See Comments)    UNK    Allergies as of 02/24/2021       Reactions   Azopt [brinzolamide] Other (See Comments)   UNK   Brimonidine Other (See Comments)   UNK   Lumigan [bimatoprost] Other (See Comments)   UNK        Medication List  Accurate as of February 24, 2021 11:59 PM. If you have any questions, ask your nurse or doctor.          acetaminophen 325 MG tablet Commonly known as: TYLENOL Take 650 mg by mouth every 6 (six) hours as needed.   apixaban 2.5 MG Tabs tablet Commonly known as: ELIQUIS Take 1 tablet (2.5 mg total) by mouth 2 (two) times daily.   atorvastatin 20 MG tablet Commonly known as: LIPITOR Take 1 tablet (20 mg total) by mouth daily at 6 PM.   b complex vitamins tablet Take 1 tablet by mouth daily.   bismuth subsalicylate 158 XE/94MH suspension Commonly known as: PEPTO BISMOL Take 60 mLs by mouth daily as needed for indigestion or diarrhea or loose stools.   Calcium Carbonate-Vitamin D 600-400 MG-UNIT tablet Take 1 tablet by mouth daily.   CENTRUM SILVER PO Take 1 tablet by mouth daily. 7am-10am.   lactose free nutrition Liqd Take 237 mLs by mouth daily.    melatonin 3 MG Tabs tablet Take 3 mg by mouth at bedtime.   metoprolol tartrate 12.5 mg Tabs tablet Commonly known as: LOPRESSOR Take 12.5 mg by mouth daily as needed. Use PRN if HR more then 110 and SBP more then 100   metoprolol tartrate 25 MG tablet Commonly known as: LOPRESSOR Take 0.5 tablets (12.5 mg total) by mouth 2 (two) times daily.   NON FORMULARY Moisturizing Cream to extremities with morning and evening care Every Shift   omeprazole 20 MG capsule Commonly known as: PRILOSEC Take 20 mg by mouth daily.   ORAJEL MT Use as directed in the mouth or throat 3 (three) times daily. Apply to RIGHT tongue/Inner mouth for pain   sertraline 25 MG tablet Commonly known as: ZOLOFT Take 12.5 mg by mouth daily.   timolol 0.5 % ophthalmic solution Commonly known as: TIMOPTIC SMARTSIG:In Eye(s)   zinc oxide 20 % ointment Apply 1 application topically as needed for irritation.        Review of Systems  Constitutional:  Negative for activity change, appetite change and fever.  HENT:  Positive for hearing loss. Negative for congestion, mouth sores, trouble swallowing and voice change.   Eyes:  Negative for visual disturbance.  Respiratory:  Negative for shortness of breath.   Cardiovascular:  Positive for leg swelling.  Gastrointestinal:  Negative for abdominal pain and constipation.  Genitourinary:  Positive for frequency. Negative for dysuria and urgency.  Musculoskeletal:  Positive for arthralgias and gait problem.  Skin:  Positive for wound.  Neurological:  Negative for speech difficulty, weakness and light-headedness.       Memory lapses.   Psychiatric/Behavioral:  Positive for confusion. Negative for sleep disturbance. The patient is not nervous/anxious.        Baseline mentation.    Immunization History  Administered Date(s) Administered   DTaP 09/05/2004   Influenza, High Dose Seasonal PF 01/06/2014, 11/08/2018   Influenza, Quadrivalent, Recombinant, Inj, Pf  01/09/2018, 01/21/2019   Influenza-Unspecified 11/07/2013, 01/14/2020, 01/26/2021   Moderna SARS-COV2 Booster Vaccination 02/22/2020, 09/06/2020   Moderna Sars-Covid-2 Vaccination 05/09/2019, 06/06/2019   Pneumococcal Conjugate-13 01/06/2014   Pneumococcal-Unspecified 09/05/2004, 10/18/2014   Pertinent  Health Maintenance Due  Topic Date Due   INFLUENZA VACCINE  Completed   DEXA SCAN  Completed   Fall Risk 01/10/2020 01/11/2020 01/11/2020 01/12/2020 01/18/2021  Falls in the past year? - - - - -  Was there an injury with Fall? - - - - -  Fall Risk Category Calculator - - - - -  Fall Risk Category - - - - -  Patient Fall Risk Level High fall risk High fall risk High fall risk High fall risk Low fall risk   Functional Status Survey:    Vitals:   02/27/21 1445  BP: 102/80  Pulse: (!) 140  Resp: 16  Temp: (!) 97 F (36.1 C)  SpO2: 95%  Weight: 106 lb (48.1 kg)  Height: '4\' 5"'  (1.346 m)   Body mass index is 26.53 kg/m. Physical Exam Vitals and nursing note reviewed.  Constitutional:      Appearance: Normal appearance.  HENT:     Head: Normocephalic and atraumatic.     Nose: Nose normal.     Mouth/Throat:     Mouth: Mucous membranes are moist.     Pharynx: No oropharyngeal exudate or posterior oropharyngeal erythema.  Eyes:     Extraocular Movements: Extraocular movements intact.     Conjunctiva/sclera: Conjunctivae normal.     Pupils: Pupils are equal, round, and reactive to light.  Cardiovascular:     Rate and Rhythm: Normal rate and regular rhythm.     Heart sounds: No murmur heard. Pulmonary:     Effort: Pulmonary effort is normal.     Breath sounds: No rales.  Abdominal:     General: Bowel sounds are normal.     Palpations: Abdomen is soft.     Tenderness: There is no abdominal tenderness.  Musculoskeletal:     Cervical back: Normal range of motion and neck supple.     Right lower leg: Edema present.     Left lower leg: No edema.     Comments: able to walk/bear  weight. Mild swelling RLE in the delayed healing wound region.   Skin:    General: Skin is warm and dry.     Findings: Erythema present.     Comments: The medial right lower leg laceration, suture removed, wound edge rolled in center of the laceration, small tunneling wound formed, mild erythema, warmth, and swelling, overall improving.    Neurological:     General: No focal deficit present.     Mental Status: She is alert and oriented to person, place, and time. Mental status is at baseline.     Motor: No weakness.     Coordination: Coordination normal.     Gait: Gait abnormal.  Psychiatric:        Mood and Affect: Mood normal.        Behavior: Behavior normal.        Thought Content: Thought content normal.    Labs reviewed: Recent Labs    07/08/20 0000 01/19/21 0000  NA 144 135*  K 4.3 4.5  CL 111* 105  CO2 22 18  BUN 35* 36*  CREATININE 0.9 1.2*  CALCIUM 8.7 8.7   Recent Labs    07/08/20 0000 01/19/21 0000  AST 35 33  ALT 27 26  ALKPHOS 81 86  ALBUMIN 3.8 4.1   Recent Labs    07/08/20 0000 01/19/21 0000  WBC 5.5 9.2  NEUTROABS 3,586.00 7,121.00  HGB 14.0 15.4  HCT 44 46  PLT 222 250   Lab Results  Component Value Date   TSH 4.45 07/08/2020   Lab Results  Component Value Date   HGBA1C 5.9 (H) 11/04/2019   Lab Results  Component Value Date   CHOL 138 11/06/2019   HDL 49 11/06/2019   LDLCALC 72 11/06/2019   TRIG 86 11/06/2019   CHOLHDL 2.8 11/06/2019    Significant  Diagnostic Results in last 30 days:  No results found.  Assessment/Plan: Delayed wound healing slow healing traumatic skin laceration, reported still redness, tenderness, and drainage, currently treated with Bactroban topically. Overall improved except a small tunneling wound   Edema not apparent except LLE with open wound, off Torsemide.   Tachycardia PSVT off Amiodarone 01/2020 in hospital due to prolonged QT interval. On Metoprolol, prn if HR >100, on Eliquis.   GERD  (gastroesophageal reflux disease) Stable, takes Omeprazole. Hgb 15.4 01/19/21  Depression, recurrent ([place]) Her mood is stable, takes Sertraline.   CKD (chronic kidney disease) stage 3, GFR 30-59 ml/min (HCC) Bun/creat 36/1.18 eGFR 42 01/19/21  Vascular dementia [facility]) Vascular dementia since CVA, no behavioral issues. MMSE 20/30 10/28/19  Stroke ([place]) 11/02/19 CVA (MRI cevebral small acute infarcts in the posterior left hemisphere in the left PCA and MCA watershed area. Takes Eliquis.   Anemia, iron deficiency 01/21/20 Iron 44, ferritin 63, Vit B12 695, Hgb 15.4 01/19/21    Family/ staff Communication: plan of care reviewed with the patient and charge nurse.   Labs/tests ordered:  none  Time spend 40 minutes.

## 2021-02-27 ENCOUNTER — Encounter: Payer: Self-pay | Admitting: Nurse Practitioner

## 2021-02-27 DIAGNOSIS — M25562 Pain in left knee: Secondary | ICD-10-CM | POA: Diagnosis not present

## 2021-02-27 DIAGNOSIS — R29898 Other symptoms and signs involving the musculoskeletal system: Secondary | ICD-10-CM | POA: Diagnosis not present

## 2021-02-27 DIAGNOSIS — M6281 Muscle weakness (generalized): Secondary | ICD-10-CM | POA: Diagnosis not present

## 2021-02-27 DIAGNOSIS — R2681 Unsteadiness on feet: Secondary | ICD-10-CM | POA: Diagnosis not present

## 2021-02-27 DIAGNOSIS — R296 Repeated falls: Secondary | ICD-10-CM | POA: Diagnosis not present

## 2021-02-28 ENCOUNTER — Encounter: Payer: Self-pay | Admitting: Nurse Practitioner

## 2021-02-28 NOTE — Assessment & Plan Note (Signed)
not apparent except LLE with open wound, off Torsemide.

## 2021-02-28 NOTE — Assessment & Plan Note (Signed)
Her mood is stable, takes Sertraline 

## 2021-02-28 NOTE — Assessment & Plan Note (Signed)
01/21/20 Iron 44, ferritin 63, Vit B12 695, Hgb 15.4 01/19/21

## 2021-02-28 NOTE — Assessment & Plan Note (Signed)
Bun/creat 36/1.18 eGFR 42 01/19/21 

## 2021-02-28 NOTE — Assessment & Plan Note (Signed)
Stable, takes Omeprazole. Hgb 15.4 01/19/21

## 2021-02-28 NOTE — Assessment & Plan Note (Signed)
Vascular dementia since CVA, no behavioral issues. MMSE 20/30 10/28/19

## 2021-02-28 NOTE — Assessment & Plan Note (Signed)
PSVT off Amiodarone 01/2020 in hospital due to prolonged QT interval. On Metoprolol, prn if HR >100, on Eliquis.

## 2021-02-28 NOTE — Assessment & Plan Note (Signed)
11/02/19 CVA (MRI cevebral small acute infarcts in the posterior left hemisphere in the left PCA and MCA watershed area. Takes Eliquis.  

## 2021-03-01 DIAGNOSIS — R2681 Unsteadiness on feet: Secondary | ICD-10-CM | POA: Diagnosis not present

## 2021-03-01 DIAGNOSIS — R29898 Other symptoms and signs involving the musculoskeletal system: Secondary | ICD-10-CM | POA: Diagnosis not present

## 2021-03-01 DIAGNOSIS — M6281 Muscle weakness (generalized): Secondary | ICD-10-CM | POA: Diagnosis not present

## 2021-03-01 DIAGNOSIS — R296 Repeated falls: Secondary | ICD-10-CM | POA: Diagnosis not present

## 2021-03-01 DIAGNOSIS — M25562 Pain in left knee: Secondary | ICD-10-CM | POA: Diagnosis not present

## 2021-03-03 DIAGNOSIS — M25562 Pain in left knee: Secondary | ICD-10-CM | POA: Diagnosis not present

## 2021-03-03 DIAGNOSIS — R2681 Unsteadiness on feet: Secondary | ICD-10-CM | POA: Diagnosis not present

## 2021-03-03 DIAGNOSIS — M6281 Muscle weakness (generalized): Secondary | ICD-10-CM | POA: Diagnosis not present

## 2021-03-03 DIAGNOSIS — R296 Repeated falls: Secondary | ICD-10-CM | POA: Diagnosis not present

## 2021-03-03 DIAGNOSIS — R29898 Other symptoms and signs involving the musculoskeletal system: Secondary | ICD-10-CM | POA: Diagnosis not present

## 2021-03-08 DIAGNOSIS — M6281 Muscle weakness (generalized): Secondary | ICD-10-CM | POA: Diagnosis not present

## 2021-03-08 DIAGNOSIS — R2681 Unsteadiness on feet: Secondary | ICD-10-CM | POA: Diagnosis not present

## 2021-03-08 DIAGNOSIS — R296 Repeated falls: Secondary | ICD-10-CM | POA: Diagnosis not present

## 2021-03-08 DIAGNOSIS — R29898 Other symptoms and signs involving the musculoskeletal system: Secondary | ICD-10-CM | POA: Diagnosis not present

## 2021-03-08 DIAGNOSIS — M25562 Pain in left knee: Secondary | ICD-10-CM | POA: Diagnosis not present

## 2021-03-09 DIAGNOSIS — R296 Repeated falls: Secondary | ICD-10-CM | POA: Diagnosis not present

## 2021-03-09 DIAGNOSIS — R2681 Unsteadiness on feet: Secondary | ICD-10-CM | POA: Diagnosis not present

## 2021-03-09 DIAGNOSIS — R29898 Other symptoms and signs involving the musculoskeletal system: Secondary | ICD-10-CM | POA: Diagnosis not present

## 2021-03-09 DIAGNOSIS — M6281 Muscle weakness (generalized): Secondary | ICD-10-CM | POA: Diagnosis not present

## 2021-03-09 DIAGNOSIS — M25562 Pain in left knee: Secondary | ICD-10-CM | POA: Diagnosis not present

## 2021-03-10 DIAGNOSIS — M6281 Muscle weakness (generalized): Secondary | ICD-10-CM | POA: Diagnosis not present

## 2021-03-10 DIAGNOSIS — R296 Repeated falls: Secondary | ICD-10-CM | POA: Diagnosis not present

## 2021-03-10 DIAGNOSIS — R2681 Unsteadiness on feet: Secondary | ICD-10-CM | POA: Diagnosis not present

## 2021-03-10 DIAGNOSIS — R29898 Other symptoms and signs involving the musculoskeletal system: Secondary | ICD-10-CM | POA: Diagnosis not present

## 2021-03-10 DIAGNOSIS — M25562 Pain in left knee: Secondary | ICD-10-CM | POA: Diagnosis not present

## 2021-03-13 DIAGNOSIS — R2681 Unsteadiness on feet: Secondary | ICD-10-CM | POA: Diagnosis not present

## 2021-03-13 DIAGNOSIS — R29898 Other symptoms and signs involving the musculoskeletal system: Secondary | ICD-10-CM | POA: Diagnosis not present

## 2021-03-13 DIAGNOSIS — M25562 Pain in left knee: Secondary | ICD-10-CM | POA: Diagnosis not present

## 2021-03-13 DIAGNOSIS — M6281 Muscle weakness (generalized): Secondary | ICD-10-CM | POA: Diagnosis not present

## 2021-03-13 DIAGNOSIS — R296 Repeated falls: Secondary | ICD-10-CM | POA: Diagnosis not present

## 2021-03-14 ENCOUNTER — Non-Acute Institutional Stay (INDEPENDENT_AMBULATORY_CARE_PROVIDER_SITE_OTHER): Payer: Medicare Other | Admitting: Nurse Practitioner

## 2021-03-14 ENCOUNTER — Encounter: Payer: Self-pay | Admitting: Nurse Practitioner

## 2021-03-14 DIAGNOSIS — Z0000 Encounter for general adult medical examination without abnormal findings: Secondary | ICD-10-CM

## 2021-03-14 NOTE — Progress Notes (Addendum)
Subjective:   Bianca [name] is a 87 y.o. female who presents for Medicare Annual (Subsequent) preventive examination at [place].  Cardiac Risk Factors include: advanced age (>77men, >104 women);dyslipidemia;hypertension     Objective:    Today's Vitals   03/14/21 1510 03/14/21 1639  BP:  136/78  Pulse:  (!) 126  Resp:  20  Temp:  (!) 97 F (36.1 C)  SpO2:  93%  Weight:  107 lb (48.5 kg)  Height:  4\' 5"  (1.346 m)  PainSc: 3     Body mass index is 26.78 kg/m.  Advanced Directives 03/14/2021 02/24/2021 02/17/2021 02/08/2021 01/18/2021 12/06/2020 08/22/2020  Does Patient Have a Medical Advance Directive? Yes Yes Yes Yes No Yes Yes  Type of Paramedic of [place];Living will;Out of facility DNR (pink MOST or yellow form) [place];Living will;Out of facility DNR (pink MOST or yellow form) [place];Living will;Out of facility DNR (pink MOST or yellow form) [place];Living will;Out of facility DNR (pink MOST or yellow form) - Out of facility DNR (pink MOST or yellow form);[place];Living will [place];Living will;Out of facility DNR (pink MOST or yellow form)  Does patient want to make changes to medical advance directive? No - Patient declined No - Patient declined No - Patient declined No - Patient declined - No - Patient declined No - Patient declined  Copy of [place] in Chart? Yes - validated most recent copy scanned in chart (See row information) Yes - validated most recent copy scanned in chart (See row information) Yes - validated most recent copy scanned in chart (See row information) Yes - validated most recent copy scanned in chart (See row information) - Yes - validated most recent copy scanned in chart (See row information) Yes - validated most recent copy scanned in chart (See row information)  Would patient  like information on creating a medical advance directive? - - - - No - Patient declined - -  Pre-existing out of facility DNR order (yellow form or pink MOST form) - Yellow form placed in chart (order not valid for inpatient use) Yellow form placed in chart (order not valid for inpatient use) Yellow form placed in chart (order not valid for inpatient use) - Yellow form placed in chart (order not valid for inpatient use) Yellow form placed in chart (order not valid for inpatient use)    Current Medications (verified) Outpatient Encounter Medications as of 03/14/2021  Medication Sig   acetaminophen (TYLENOL) 325 MG tablet Take 650 mg by mouth every 6 (six) hours as needed.   apixaban (ELIQUIS) 2.5 MG TABS tablet Take 1 tablet (2.5 mg total) by mouth 2 (two) times daily.   atorvastatin (LIPITOR) 20 MG tablet Take 1 tablet (20 mg total) by mouth daily at 6 PM.   b complex vitamins tablet Take 1 tablet by mouth daily.    Benzocaine (ORAJEL MT) Use as directed in the mouth or throat 3 (three) times daily. Apply to RIGHT tongue/Inner mouth for pain   bismuth subsalicylate (PEPTO BISMOL) 262 MG/15ML suspension Take 60 mLs by mouth daily as needed for indigestion or diarrhea or loose stools.    Calcium Carbonate-Vitamin D 600-400 MG-UNIT tablet Take 1 tablet by mouth daily.   lactose free nutrition (BOOST) LIQD Take 237 mLs by mouth daily.   melatonin 3 MG TABS tablet Take 3 mg by mouth at bedtime.  metoprolol tartrate (LOPRESSOR) 12.5 mg TABS tablet Take 12.5 mg by mouth daily as needed. Use PRN if HR more then 110 and SBP more then 100   metoprolol tartrate (LOPRESSOR) 25 MG tablet Take 0.5 tablets (12.5 mg total) by mouth 2 (two) times daily.   Multiple Vitamins-Minerals (CENTRUM SILVER PO) Take 1 tablet by mouth daily. 7am-10am.   mupirocin ointment (BACTROBAN) 2 % 1 application daily. Cleanse wound to right lower leg with NS, apply Bactroban to saline gauze or packing strip then pack open wound (RLE)  until healed   NON FORMULARY Moisturizing Cream to extremities with morning and evening care Every Shift   omeprazole (PRILOSEC) 20 MG capsule Take 20 mg by mouth daily.    sertraline (ZOLOFT) 25 MG tablet Take 12.5 mg by mouth daily.   timolol (TIMOPTIC) 0.5 % ophthalmic solution SMARTSIG:In Eye(s)   zinc oxide 20 % ointment Apply 1 application topically as needed for irritation.   No facility-administered encounter medications on file as of 03/14/2021.    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 No family history on file. Social History   Socioeconomic History   Marital status: Widowed    Spouse name: Not on file   Number of children: 0   Years of education: Not on file   Highest education level: Not on file  Occupational History   Not on file  Tobacco Use   Smoking status: Never   Smokeless tobacco: Never  Vaping Use   Vaping Use: Never used  Substance and Sexual Activity   Alcohol use: Never   Drug use: Never   Sexual activity: Not on file  Other Topics Concern   Not on file  Social History Narrative   Not on file   Social Determinants of Health   Financial Resource Strain: Not on file  Food Insecurity: Not on file  Transportation Needs: Not on file  Physical Activity: Not on file  Stress: Not on file  Social Connections: Not on file    Tobacco Counseling Counseling given: Not Answered    Clinical Intake:  Pre-visit preparation completed: Yes  Pain : 0-10 Pain Score: 3  Pain Type: Chronic pain Pain Location: Leg Pain  Orientation: Right, Left Pain Descriptors / Indicators: Aching, Discomfort, Dull Pain Onset: More than a month ago Pain Frequency: Several days a week Pain Relieving Factors: Tylenol Effect of Pain on Daily Activities: limited walking  Pain Relieving Factors: Tylenol  BMI - recorded: 26.53 Nutritional Status: BMI 25 -29 Overweight Nutritional Risks: None Diabetes: No  What is the last grade level you completed in school?: 4 years college  Diabetic?No  Interpreter Needed?: No  Information entered by :: Leone Putman Bretta Bang NP   Activities of Daily Living In your present state of health, do you have any difficulty performing the following activities: 03/14/2021  Hearing? Y  Comment doesn't want hearing aids  Vision? N  Difficulty concentrating or making decisions? Y  Walking or climbing stairs? Y  Dressing or bathing? Y  Preparing Food and eating ? N  Using the  Toilet? N  In the past six months, have you accidently leaked urine? Y  Do you have problems with loss of bowel control? N  Managing your Medications? Y  Managing your Finances? Y  Housekeeping or managing your Housekeeping? Y  Some recent data might be hidden    Patient Care Team: Virgie Dad, MD as PCP - General (Internal Medicine)  Indicate any recent Medical Services you may have received from other than [facility] providers in the past year (date may be approximate).     Assessment:   This is a routine wellness examination for [facility].  Hearing/Vision screen No results found.  Dietary issues and exercise activities discussed: Current Exercise Habits: The patient does not participate in regular exercise at present, Exercise limited by: cardiac condition(s);orthopedic condition(s)   Goals Addressed             This Visit's Progress    Maintain Mobility and Function       Evidence-based guidance:  Emphasize the importance of physical activity and aerobic exercise as included in treatment plan; assess  barriers to adherence; consider patient's abilities and preferences.  Encourage gradual increase in activity or exercise instead of stopping if pain occurs.  Reinforce individual therapy exercise prescription, such as strengthening, stabilization and stretching programs.  Promote optimal body mechanics to stabilize the spine with lifting and functional activity.  Encourage activity and mobility modifications to facilitate optimal function, such as using a log roll for bed mobility or dressing from a seated position.  Reinforce individual adaptive equipment recommendations to limit excessive spinal movements, such as a Systems analyst.  Assess adequacy of sleep; encourage use of sleep hygiene techniques, such as bedtime routine; use of white noise; dark, cool bedroom; avoiding daytime naps, heavy meals or exercise before bedtime.  Promote positions and modification to optimize sleep and sexual activity; consider pillows or positioning devices to assist in maintaining neutral spine.  Explore options for applying ergonomic principles at work and home, such as frequent position changes, using ergonomically designed equipment and working at optimal height.  Promote modifications to increase comfort with driving such as lumbar support, optimizing seat and steering wheel position, using cruise control and taking frequent rest stops to stretch and walk.   Notes:       Depression Screen No flowsheet data found.  Fall Risk Fall Risk  05/19/2019 05/14/2019  Falls in the past year? 0 0  Number falls in past yr: 0 0  Injury with Fall? 0 -    FALL RISK PREVENTION PERTAINING TO THE HOME:  Any stairs in or around the home? Yes  If so, are there any without handrails? No  Home free of loose throw rugs in walkways, pet beds, electrical cords, etc? Yes  Adequate lighting in your home to reduce risk of falls? Yes   ASSISTIVE DEVICES UTILIZED TO PREVENT FALLS:  Life alert? No  Use of a cane, walker or  w/c? Yes  Grab bars in the bathroom? Yes  Shower chair or bench in shower? Yes  Elevated toilet seat or a handicapped toilet? No   TIMED UP AND GO:  Was the test performed? Yes .  Length of time to ambulate 10 feet: 15 sec.   Gait slow and steady with assistive device  Cognitive Function: MMSE - Mini Mental State Exam 03/25/2021 12/28/2019  Orientation to time 1 2  Orientation to Place 1 4  Registration 3 3  Attention/ Calculation 1 1  Recall 1 0  Language-  name 2 objects 1 2  Language- repeat 1 1  Language- follow 3 step command 3 3  Language- read & follow direction 1 1  Write a sentence 1 1  Copy design 1 0  Total score 15 18        Immunizations Immunization History  Administered Date(s) Administered   DTaP 09/05/2004   Influenza, High Dose Seasonal PF 01/06/2014, 11/08/2018   Influenza, Quadrivalent, Recombinant, Inj, Pf 01/09/2018, 01/21/2019   Influenza-Unspecified 11/07/2013, 01/14/2020, 01/26/2021   Moderna SARS-COV2 Booster Vaccination 02/22/2020, 09/06/2020   Moderna Sars-Covid-2 Vaccination 05/09/2019, 06/06/2019   Pneumococcal Conjugate-13 01/06/2014   Pneumococcal-Unspecified 09/05/2004, 10/18/2014    TDAP status: Up to date  Flu Vaccine status: Up to date  Pneumococcal vaccine status: Up to date  Covid-19 vaccine status: Information provided on how to obtain vaccines.   Qualifies for Shingles Vaccine? Yes   Zostavax completed No   Shingrix Completed?: No.    Education has been provided regarding the importance of this vaccine. Patient has been advised to call insurance company to determine out of pocket expense if they have not yet received this vaccine. Advised may also receive vaccine at local pharmacy or Health Dept. Verbalized acceptance and understanding.  Screening Tests Health Maintenance  Topic Date Due   TETANUS/TDAP  Never done   Zoster Vaccines- Shingrix (1 of 2) Never done   Pneumonia Vaccine 67+ Years old (2 - PPSV23 if available,  else PCV20) 10/18/2015   COVID-19 Vaccine (3 - Booster for Moderna series) 11/01/2020   INFLUENZA VACCINE  Completed   DEXA SCAN  Completed   HPV VACCINES  Aged Out    Health Maintenance  Health Maintenance Due  Topic Date Due   TETANUS/TDAP  Never done   Zoster Vaccines- Shingrix (1 of 2) Never done   Pneumonia Vaccine 85+ Years old (2 - PPSV23 if available, else PCV20) 10/18/2015   COVID-19 Vaccine (3 - Booster for Moderna series) 11/01/2020    Colorectal cancer screening: No longer required.   Mammogram status: No longer required due to  .  Bone Density status: Ordered DEXA. Pt provided with contact info and advised to call to schedule appt.  Lung Cancer Screening: (Low Dose CT Chest recommended if Age 80-80 years, 30 pack-year currently smoking OR have quit w/in 15years.) does not qualify.   Additional Screening:  Hepatitis C Screening: does not qualify  Vision Screening: Recommended annual ophthalmology exams for early detection of glaucoma and other disorders of the eye. Is the patient up to date with their annual eye exam?  No  Who is the provider or what is the name of the office in which the patient attends annual eye exams? Dr. Alanda Slim If pt is not established with a provider, would they like to be referred to a provider to establish care? No .   Dental Screening: Recommended annual dental exams for proper oral hygiene  Community Resource Referral / Chronic Care Management: CRR required this visit?  No   CCM required this visit?  No      Plan:     I have personally reviewed and noted the following in the patient's chart:   Medical and social history Use of alcohol, tobacco or illicit drugs  Current medications and supplements including opioid prescriptions.  Functional ability and status Nutritional status Physical activity Advanced directives List of other physicians Hospitalizations, surgeries, and ER visits in previous 12 months Vitals Screenings  to include cognitive, depression, and falls Referrals and appointments  In  addition, I have reviewed and discussed with patient certain preventive protocols, quality metrics, and best practice recommendations. A written personalized care plan for preventive services as well as general preventive health recommendations were provided to patient.   MMSE Shingrix, DEXA, f/u Ophthalmology order provided.   Dail Lerew X Thea Holshouser, NP   03/25/2021

## 2021-03-15 DIAGNOSIS — R29898 Other symptoms and signs involving the musculoskeletal system: Secondary | ICD-10-CM | POA: Diagnosis not present

## 2021-03-15 DIAGNOSIS — R296 Repeated falls: Secondary | ICD-10-CM | POA: Diagnosis not present

## 2021-03-15 DIAGNOSIS — M6281 Muscle weakness (generalized): Secondary | ICD-10-CM | POA: Diagnosis not present

## 2021-03-15 DIAGNOSIS — R2681 Unsteadiness on feet: Secondary | ICD-10-CM | POA: Diagnosis not present

## 2021-03-15 DIAGNOSIS — M25562 Pain in left knee: Secondary | ICD-10-CM | POA: Diagnosis not present

## 2021-03-17 DIAGNOSIS — M6281 Muscle weakness (generalized): Secondary | ICD-10-CM | POA: Diagnosis not present

## 2021-03-17 DIAGNOSIS — R296 Repeated falls: Secondary | ICD-10-CM | POA: Diagnosis not present

## 2021-03-17 DIAGNOSIS — M25562 Pain in left knee: Secondary | ICD-10-CM | POA: Diagnosis not present

## 2021-03-17 DIAGNOSIS — R2681 Unsteadiness on feet: Secondary | ICD-10-CM | POA: Diagnosis not present

## 2021-03-17 DIAGNOSIS — R29898 Other symptoms and signs involving the musculoskeletal system: Secondary | ICD-10-CM | POA: Diagnosis not present

## 2021-03-20 DIAGNOSIS — R29898 Other symptoms and signs involving the musculoskeletal system: Secondary | ICD-10-CM | POA: Diagnosis not present

## 2021-03-20 DIAGNOSIS — M6281 Muscle weakness (generalized): Secondary | ICD-10-CM | POA: Diagnosis not present

## 2021-03-20 DIAGNOSIS — M25562 Pain in left knee: Secondary | ICD-10-CM | POA: Diagnosis not present

## 2021-03-20 DIAGNOSIS — R296 Repeated falls: Secondary | ICD-10-CM | POA: Diagnosis not present

## 2021-03-20 DIAGNOSIS — R2681 Unsteadiness on feet: Secondary | ICD-10-CM | POA: Diagnosis not present

## 2021-03-22 DIAGNOSIS — M25562 Pain in left knee: Secondary | ICD-10-CM | POA: Diagnosis not present

## 2021-03-22 DIAGNOSIS — R29898 Other symptoms and signs involving the musculoskeletal system: Secondary | ICD-10-CM | POA: Diagnosis not present

## 2021-03-22 DIAGNOSIS — R296 Repeated falls: Secondary | ICD-10-CM | POA: Diagnosis not present

## 2021-03-22 DIAGNOSIS — M6281 Muscle weakness (generalized): Secondary | ICD-10-CM | POA: Diagnosis not present

## 2021-03-22 DIAGNOSIS — R2681 Unsteadiness on feet: Secondary | ICD-10-CM | POA: Diagnosis not present

## 2021-03-24 DIAGNOSIS — M25562 Pain in left knee: Secondary | ICD-10-CM | POA: Diagnosis not present

## 2021-03-24 DIAGNOSIS — R29898 Other symptoms and signs involving the musculoskeletal system: Secondary | ICD-10-CM | POA: Diagnosis not present

## 2021-03-24 DIAGNOSIS — M6281 Muscle weakness (generalized): Secondary | ICD-10-CM | POA: Diagnosis not present

## 2021-03-24 DIAGNOSIS — R2681 Unsteadiness on feet: Secondary | ICD-10-CM | POA: Diagnosis not present

## 2021-03-24 DIAGNOSIS — R296 Repeated falls: Secondary | ICD-10-CM | POA: Diagnosis not present

## 2021-03-27 DIAGNOSIS — M25562 Pain in left knee: Secondary | ICD-10-CM | POA: Diagnosis not present

## 2021-03-27 DIAGNOSIS — R2681 Unsteadiness on feet: Secondary | ICD-10-CM | POA: Diagnosis not present

## 2021-03-27 DIAGNOSIS — R29898 Other symptoms and signs involving the musculoskeletal system: Secondary | ICD-10-CM | POA: Diagnosis not present

## 2021-03-27 DIAGNOSIS — R296 Repeated falls: Secondary | ICD-10-CM | POA: Diagnosis not present

## 2021-03-27 DIAGNOSIS — M6281 Muscle weakness (generalized): Secondary | ICD-10-CM | POA: Diagnosis not present

## 2021-03-29 DIAGNOSIS — R2681 Unsteadiness on feet: Secondary | ICD-10-CM | POA: Diagnosis not present

## 2021-03-29 DIAGNOSIS — R296 Repeated falls: Secondary | ICD-10-CM | POA: Diagnosis not present

## 2021-03-29 DIAGNOSIS — M25562 Pain in left knee: Secondary | ICD-10-CM | POA: Diagnosis not present

## 2021-03-29 DIAGNOSIS — M6281 Muscle weakness (generalized): Secondary | ICD-10-CM | POA: Diagnosis not present

## 2021-03-29 DIAGNOSIS — R29898 Other symptoms and signs involving the musculoskeletal system: Secondary | ICD-10-CM | POA: Diagnosis not present

## 2021-03-31 DIAGNOSIS — M25562 Pain in left knee: Secondary | ICD-10-CM | POA: Diagnosis not present

## 2021-03-31 DIAGNOSIS — R29898 Other symptoms and signs involving the musculoskeletal system: Secondary | ICD-10-CM | POA: Diagnosis not present

## 2021-03-31 DIAGNOSIS — R2681 Unsteadiness on feet: Secondary | ICD-10-CM | POA: Diagnosis not present

## 2021-03-31 DIAGNOSIS — M6281 Muscle weakness (generalized): Secondary | ICD-10-CM | POA: Diagnosis not present

## 2021-03-31 DIAGNOSIS — R296 Repeated falls: Secondary | ICD-10-CM | POA: Diagnosis not present

## 2021-04-03 DIAGNOSIS — M25562 Pain in left knee: Secondary | ICD-10-CM | POA: Diagnosis not present

## 2021-04-03 DIAGNOSIS — R29898 Other symptoms and signs involving the musculoskeletal system: Secondary | ICD-10-CM | POA: Diagnosis not present

## 2021-04-03 DIAGNOSIS — M6281 Muscle weakness (generalized): Secondary | ICD-10-CM | POA: Diagnosis not present

## 2021-04-03 DIAGNOSIS — R2681 Unsteadiness on feet: Secondary | ICD-10-CM | POA: Diagnosis not present

## 2021-04-03 DIAGNOSIS — R296 Repeated falls: Secondary | ICD-10-CM | POA: Diagnosis not present

## 2021-04-04 DIAGNOSIS — Z23 Encounter for immunization: Secondary | ICD-10-CM | POA: Diagnosis not present

## 2021-04-05 DIAGNOSIS — R2681 Unsteadiness on feet: Secondary | ICD-10-CM | POA: Diagnosis not present

## 2021-04-05 DIAGNOSIS — R296 Repeated falls: Secondary | ICD-10-CM | POA: Diagnosis not present

## 2021-04-05 DIAGNOSIS — M6281 Muscle weakness (generalized): Secondary | ICD-10-CM | POA: Diagnosis not present

## 2021-04-05 DIAGNOSIS — M25562 Pain in left knee: Secondary | ICD-10-CM | POA: Diagnosis not present

## 2021-04-05 DIAGNOSIS — R29898 Other symptoms and signs involving the musculoskeletal system: Secondary | ICD-10-CM | POA: Diagnosis not present

## 2021-04-07 DIAGNOSIS — R29898 Other symptoms and signs involving the musculoskeletal system: Secondary | ICD-10-CM | POA: Diagnosis not present

## 2021-04-07 DIAGNOSIS — M85841 Other specified disorders of bone density and structure, right hand: Secondary | ICD-10-CM | POA: Diagnosis not present

## 2021-04-07 DIAGNOSIS — M25562 Pain in left knee: Secondary | ICD-10-CM | POA: Diagnosis not present

## 2021-04-07 DIAGNOSIS — R296 Repeated falls: Secondary | ICD-10-CM | POA: Diagnosis not present

## 2021-04-07 DIAGNOSIS — M6281 Muscle weakness (generalized): Secondary | ICD-10-CM | POA: Diagnosis not present

## 2021-04-07 DIAGNOSIS — R2681 Unsteadiness on feet: Secondary | ICD-10-CM | POA: Diagnosis not present

## 2021-04-10 DIAGNOSIS — R29898 Other symptoms and signs involving the musculoskeletal system: Secondary | ICD-10-CM | POA: Diagnosis not present

## 2021-04-10 DIAGNOSIS — M25562 Pain in left knee: Secondary | ICD-10-CM | POA: Diagnosis not present

## 2021-04-10 DIAGNOSIS — M6281 Muscle weakness (generalized): Secondary | ICD-10-CM | POA: Diagnosis not present

## 2021-04-10 DIAGNOSIS — R2681 Unsteadiness on feet: Secondary | ICD-10-CM | POA: Diagnosis not present

## 2021-04-10 DIAGNOSIS — R296 Repeated falls: Secondary | ICD-10-CM | POA: Diagnosis not present

## 2021-04-12 DIAGNOSIS — R29898 Other symptoms and signs involving the musculoskeletal system: Secondary | ICD-10-CM | POA: Diagnosis not present

## 2021-04-12 DIAGNOSIS — M6281 Muscle weakness (generalized): Secondary | ICD-10-CM | POA: Diagnosis not present

## 2021-04-12 DIAGNOSIS — R296 Repeated falls: Secondary | ICD-10-CM | POA: Diagnosis not present

## 2021-04-12 DIAGNOSIS — M25562 Pain in left knee: Secondary | ICD-10-CM | POA: Diagnosis not present

## 2021-04-12 DIAGNOSIS — R2681 Unsteadiness on feet: Secondary | ICD-10-CM | POA: Diagnosis not present

## 2021-04-14 DIAGNOSIS — R296 Repeated falls: Secondary | ICD-10-CM | POA: Diagnosis not present

## 2021-04-14 DIAGNOSIS — R29898 Other symptoms and signs involving the musculoskeletal system: Secondary | ICD-10-CM | POA: Diagnosis not present

## 2021-04-14 DIAGNOSIS — M25562 Pain in left knee: Secondary | ICD-10-CM | POA: Diagnosis not present

## 2021-04-14 DIAGNOSIS — R2681 Unsteadiness on feet: Secondary | ICD-10-CM | POA: Diagnosis not present

## 2021-04-14 DIAGNOSIS — M6281 Muscle weakness (generalized): Secondary | ICD-10-CM | POA: Diagnosis not present

## 2021-04-17 DIAGNOSIS — M25562 Pain in left knee: Secondary | ICD-10-CM | POA: Diagnosis not present

## 2021-04-17 DIAGNOSIS — R2681 Unsteadiness on feet: Secondary | ICD-10-CM | POA: Diagnosis not present

## 2021-04-17 DIAGNOSIS — R296 Repeated falls: Secondary | ICD-10-CM | POA: Diagnosis not present

## 2021-04-17 DIAGNOSIS — R29898 Other symptoms and signs involving the musculoskeletal system: Secondary | ICD-10-CM | POA: Diagnosis not present

## 2021-04-17 DIAGNOSIS — M6281 Muscle weakness (generalized): Secondary | ICD-10-CM | POA: Diagnosis not present

## 2021-04-18 DIAGNOSIS — R29898 Other symptoms and signs involving the musculoskeletal system: Secondary | ICD-10-CM | POA: Diagnosis not present

## 2021-04-18 DIAGNOSIS — R2681 Unsteadiness on feet: Secondary | ICD-10-CM | POA: Diagnosis not present

## 2021-04-18 DIAGNOSIS — R296 Repeated falls: Secondary | ICD-10-CM | POA: Diagnosis not present

## 2021-04-18 DIAGNOSIS — M25562 Pain in left knee: Secondary | ICD-10-CM | POA: Diagnosis not present

## 2021-04-18 DIAGNOSIS — M6281 Muscle weakness (generalized): Secondary | ICD-10-CM | POA: Diagnosis not present

## 2021-04-21 DIAGNOSIS — R29898 Other symptoms and signs involving the musculoskeletal system: Secondary | ICD-10-CM | POA: Diagnosis not present

## 2021-04-21 DIAGNOSIS — M6281 Muscle weakness (generalized): Secondary | ICD-10-CM | POA: Diagnosis not present

## 2021-04-21 DIAGNOSIS — R296 Repeated falls: Secondary | ICD-10-CM | POA: Diagnosis not present

## 2021-04-21 DIAGNOSIS — R2681 Unsteadiness on feet: Secondary | ICD-10-CM | POA: Diagnosis not present

## 2021-04-21 DIAGNOSIS — M25562 Pain in left knee: Secondary | ICD-10-CM | POA: Diagnosis not present

## 2021-05-30 ENCOUNTER — Non-Acute Institutional Stay: Payer: Medicare Other | Admitting: Internal Medicine

## 2021-05-30 ENCOUNTER — Encounter: Payer: Self-pay | Admitting: Internal Medicine

## 2021-05-30 DIAGNOSIS — I639 Cerebral infarction, unspecified: Secondary | ICD-10-CM | POA: Diagnosis not present

## 2021-05-30 DIAGNOSIS — F339 Major depressive disorder, recurrent, unspecified: Secondary | ICD-10-CM | POA: Diagnosis not present

## 2021-05-30 DIAGNOSIS — I471 Supraventricular tachycardia: Secondary | ICD-10-CM

## 2021-05-30 DIAGNOSIS — N1831 Chronic kidney disease, stage 3a: Secondary | ICD-10-CM | POA: Diagnosis not present

## 2021-05-30 DIAGNOSIS — K219 Gastro-esophageal reflux disease without esophagitis: Secondary | ICD-10-CM | POA: Diagnosis not present

## 2021-05-30 DIAGNOSIS — F0150 Vascular dementia without behavioral disturbance: Secondary | ICD-10-CM | POA: Diagnosis not present

## 2021-05-30 DIAGNOSIS — D509 Iron deficiency anemia, unspecified: Secondary | ICD-10-CM | POA: Diagnosis not present

## 2021-05-30 NOTE — Progress Notes (Signed)
Location:   [place] Room Number: [place] of Service:  [phone]) Provider:  Veleta Miners MD  Virgie Dad, MD  Patient Care Team: Virgie Dad, MD as PCP - General (Internal Medicine)  Extended Emergency Contact Information Primary Emergency Contact: [name] Mobile Phone: [phone] Relation: Nephew Secondary Emergency Contact: [name] Mobile Phone: [phone] Relation: Friend  Code Status:  DNR Goals of care: Advanced Directive information Advanced Directives 05/30/2021  Does Patient Have a Medical Advance Directive? Yes  Type of Advance Directive Living will;Healthcare Power of Attorney  Does patient want to make changes to medical advance directive? No - Patient declined  Copy of [place] in Chart? Yes - validated most recent copy scanned in chart (See row information)  Would patient like information on creating a medical advance directive? -  Pre-existing out of facility DNR order (yellow form or pink MOST form) -     Chief Complaint  Patient presents with   Medical Management of Chronic Issues   Quality Metric Gaps    TETANUS/TDAP (Every 10 Years)   2017 Pneumonia Vaccine 57+ Years old NCIR/Matrix verified    HPI:  Pt is a 88 y.o. female seen today for medical management of chronic diseases.    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  Intolerant to Amiodarone due to Prolong QT and Recurent Falls  Doing well in AL Has Cognition issues Walks with her walker  She is stable. No new Nursing issues. No Behavior issues Her weight is stable No Falls Had No Acute comlains Wt Readings from Last 3 Encounters:  05/30/21 108 lb 3.2 oz (49.1 kg)  03/14/21 107 lb  (48.5 kg)  02/27/21 106 lb (48.1 kg)   Past Medical History:  Diagnosis Date   Atrial flutter, paroxysmal (HCC)    COVID-19    Dementia (HCC)    Diastolic dysfunction    Hyperlipidemia    Hypertension    PSVT (paroxysmal supraventricular tachycardia) ([place])    Stroke ([place])    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 ([place])    History reviewed. No pertinent surgical history.  Allergies  Allergen Reactions   Azopt [Brinzolamide] Other (See Comments)    UNK   Brimonidine Other (See Comments)    UNK   Lumigan [Bimatoprost] Other (See Comments)    UNK    Allergies as of 05/30/2021       Reactions   Azopt [brinzolamide] Other (See Comments)   UNK   Brimonidine Other (See Comments)   UNK   Lumigan [bimatoprost] Other (See Comments)   UNK        Medication List        Accurate as of May 30, 2021 11:58 AM. If you have any questions, ask your nurse or doctor.          STOP taking these medications    mupirocin ointment 2 % Commonly known as: BACTROBAN Stopped by: Virgie Dad, MD       TAKE these medications    acetaminophen 325 MG tablet Commonly known as: TYLENOL Take 650 mg by mouth every 6 (six) hours as needed.   apixaban 2.5 MG Tabs tablet Commonly known as:  ELIQUIS Take 1 tablet (2.5 mg total) by mouth 2 (two) times daily.   atorvastatin 20 MG tablet Commonly known as: LIPITOR Take 1 tablet (20 mg total) by mouth daily at 6 PM.   b complex vitamins tablet Take 1 tablet by mouth daily.   bismuth subsalicylate 865 HQ/46NG suspension Commonly known as: PEPTO BISMOL Take 60 mLs by mouth daily as needed for indigestion or diarrhea or loose stools.   Calcium Carbonate-Vitamin D 600-400 MG-UNIT tablet Take 1 tablet by mouth daily.   CENTRUM SILVER PO Take 1 tablet by mouth daily. 7am-10am.   lactose free nutrition Liqd Take 237 mLs by mouth daily.   melatonin 3 MG Tabs tablet Take 3  mg by mouth at bedtime.   metoprolol tartrate 12.5 mg Tabs tablet Commonly known as: LOPRESSOR Take 12.5 mg by mouth daily as needed. Use PRN if HR more then 110 and SBP more then 100   metoprolol tartrate 25 MG tablet Commonly known as: LOPRESSOR Take 0.5 tablets (12.5 mg total) by mouth 2 (two) times daily.   NON FORMULARY Moisturizing Cream to extremities with morning and evening care Every Shift   omeprazole 20 MG capsule Commonly known as: PRILOSEC Take 20 mg by mouth daily.   ORAJEL MT Use as directed in the mouth or throat 3 (three) times daily. Apply to RIGHT tongue/Inner mouth for pain   sertraline 25 MG tablet Commonly known as: ZOLOFT Take 12.5 mg by mouth daily.   timolol 0.5 % ophthalmic solution Commonly known as: TIMOPTIC SMARTSIG:In Eye(s)   zinc oxide 20 % ointment Apply 1 application topically as needed for irritation.        Review of Systems  Constitutional:  Negative for activity change and appetite change.  HENT: Negative.    Respiratory:  Negative for cough and shortness of breath.   Cardiovascular:  Negative for leg swelling.  Gastrointestinal:  Negative for constipation.  Genitourinary: Negative.   Musculoskeletal:  Positive for gait problem. Negative for arthralgias and myalgias.  Skin: Negative.   Neurological:  Negative for dizziness and weakness.  Psychiatric/Behavioral:  Positive for confusion. Negative for dysphoric mood and sleep disturbance.    Immunization History  Administered Date(s) Administered   DTaP 09/05/2004   Influenza, High Dose Seasonal PF 01/06/2014, 11/08/2018   Influenza, Quadrivalent, Recombinant, Inj, Pf 01/09/2018, 01/21/2019   Influenza-Unspecified 11/07/2013, 01/14/2020, 01/26/2021   Moderna Covid-19 Vaccine Bivalent Booster 14yrs & up 04/04/2021   Moderna SARS-COV2 Booster Vaccination 02/22/2020, 09/06/2020   Moderna Sars-Covid-2 Vaccination 05/09/2019, 06/06/2019   Pneumococcal Conjugate-13 01/06/2014    Pneumococcal-Unspecified 09/05/2004, 10/18/2014   Zoster Recombinat (Shingrix) 05/24/2021   Pertinent  Health Maintenance Due  Topic Date Due   INFLUENZA VACCINE  Completed   DEXA SCAN  Completed   Fall Risk 01/10/2020 01/11/2020 01/11/2020 01/12/2020 01/18/2021  Falls in the past year? - - - - -  Was there an injury with Fall? - - - - -  Fall Risk Category Calculator - - - - -  Fall Risk Category - - - - -  Patient Fall Risk Level High fall risk High fall risk High fall risk High fall risk Low fall risk   Functional Status Survey:    Vitals:   05/30/21 1150  BP: 120/70  Pulse: (!) 52  Resp: (!) 24  Temp: (!) 97.2 F (36.2 C)  SpO2: 96%  Weight: 108 lb 3.2 oz (49.1 kg)  Height: 4\' 5"  (1.346 m)   Body mass index is 27.08 kg/m.  Physical Exam Vitals reviewed.  Constitutional:      Appearance: Normal appearance.  HENT:     Head: Normocephalic.     Nose: Nose normal.     Mouth/Throat:     Mouth: Mucous membranes are moist.     Pharynx: Oropharynx is clear.  Eyes:     Pupils: Pupils are equal, round, and reactive to light.  Cardiovascular:     Rate and Rhythm: Normal rate and regular rhythm.     Pulses: Normal pulses.     Heart sounds: Normal heart sounds. No murmur heard. Pulmonary:     Effort: Pulmonary effort is normal.     Breath sounds: Normal breath sounds.  Abdominal:     General: Abdomen is flat. Bowel sounds are normal.     Palpations: Abdomen is soft.  Musculoskeletal:        General: No swelling.     Cervical back: Neck supple.  Skin:    General: Skin is warm.  Neurological:     General: No focal deficit present.     Mental Status: She is alert.  Psychiatric:        Mood and Affect: Mood normal.        Thought Content: Thought content normal.    Labs reviewed: Recent Labs    07/08/20 0000 01/19/21 0000  NA 144 135*  K 4.3 4.5  CL 111* 105  CO2 22 18  BUN 35* 36*  CREATININE 0.9 1.2*  CALCIUM 8.7 8.7   Recent Labs    07/08/20 0000  01/19/21 0000  AST 35 33  ALT 27 26  ALKPHOS 81 86  ALBUMIN 3.8 4.1   Recent Labs    07/08/20 0000 01/19/21 0000  WBC 5.5 9.2  NEUTROABS 3,586.00 7,121.00  HGB 14.0 15.4  HCT 44 46  PLT 222 250   Lab Results  Component Value Date   TSH 4.45 07/08/2020   Lab Results  Component Value Date   HGBA1C 5.9 (H) 11/04/2019   Lab Results  Component Value Date   CHOL 138 11/06/2019   HDL 49 11/06/2019   LDLCALC 72 11/06/2019   TRIG 86 11/06/2019   CHOLHDL 2.8 11/06/2019    Significant Diagnostic Results in last 30 days:  No results found.  Assessment/Plan 1. Paroxysmal SVT (supraventricular tachycardia) (HCC) On Low dose of Lopressor Off Amiodarone due to Prolong QT Mostly in Sinus Rhythm Few episodes of Asymptomatic Tachycardia  2. Cerebrovascular accident (CVA), unspecified mechanism ([place]) Thought to be due to Emboli On Low dose of Eliquis  3. Gastroesophageal reflux disease, unspecified whether esophagitis present Prilosec  4. Vascular dementia without behavioral disturbance ([place]) Doing well in AL  5. Stage 3a chronic kidney disease (HCC) Creat slightly high  6. Iron deficiency anemia, unspecified iron deficiency anemia type Hgb stable  7. Depression, recurrent ([place]) Continue Low dose of Zoloft 8 HLD Stable on Statin Needs repeat Labs    Family/ staff Communication:   Labs/tests ordered:  CBC<CMP,LIPid

## 2021-06-08 DIAGNOSIS — I639 Cerebral infarction, unspecified: Secondary | ICD-10-CM | POA: Diagnosis not present

## 2021-06-08 DIAGNOSIS — I10 Essential (primary) hypertension: Secondary | ICD-10-CM | POA: Diagnosis not present

## 2021-06-08 LAB — HEPATIC FUNCTION PANEL
ALT: 17 U/L (ref 7–35)
AST: 23 (ref 13–35)
Alkaline Phosphatase: 77 (ref 25–125)
Bilirubin, Total: 0.6

## 2021-06-08 LAB — BASIC METABOLIC PANEL
BUN: 23 — AB (ref 4–21)
CO2: 25 — AB (ref 13–22)
Chloride: 109 — AB (ref 99–108)
Creatinine: 0.9 (ref 0.5–1.1)
Glucose: 82
Potassium: 4.2 mEq/L (ref 3.5–5.1)
Sodium: 144 (ref 137–147)

## 2021-06-08 LAB — LIPID PANEL
Cholesterol: 78 (ref 0–200)
HDL: 46 (ref 35–70)
LDL Cholesterol: 18
LDl/HDL Ratio: 1.7
Triglycerides: 49 (ref 40–160)

## 2021-06-08 LAB — CBC AND DIFFERENTIAL
HCT: 41 (ref 36–46)
Hemoglobin: 13.3 (ref 12.0–16.0)
Neutrophils Absolute: 4303
Platelets: 199 10*3/uL (ref 150–400)
WBC: 6.2

## 2021-06-08 LAB — COMPREHENSIVE METABOLIC PANEL
Albumin: 3.6 (ref 3.5–5.0)
Calcium: 8.8 (ref 8.7–10.7)
Globulin: 1.8

## 2021-06-08 LAB — CBC: RBC: 4.21 (ref 3.87–5.11)

## 2021-06-09 DIAGNOSIS — H353211 Exudative age-related macular degeneration, right eye, with active choroidal neovascularization: Secondary | ICD-10-CM | POA: Diagnosis not present

## 2021-06-30 DIAGNOSIS — F330 Major depressive disorder, recurrent, mild: Secondary | ICD-10-CM | POA: Diagnosis not present

## 2021-06-30 DIAGNOSIS — G4700 Insomnia, unspecified: Secondary | ICD-10-CM | POA: Diagnosis not present

## 2021-07-07 DIAGNOSIS — H353211 Exudative age-related macular degeneration, right eye, with active choroidal neovascularization: Secondary | ICD-10-CM | POA: Diagnosis not present

## 2021-07-18 ENCOUNTER — Non-Acute Institutional Stay: Payer: Medicare Other | Admitting: Internal Medicine

## 2021-07-18 ENCOUNTER — Encounter: Payer: Self-pay | Admitting: Internal Medicine

## 2021-07-18 DIAGNOSIS — R21 Rash and other nonspecific skin eruption: Secondary | ICD-10-CM

## 2021-07-18 DIAGNOSIS — I471 Supraventricular tachycardia: Secondary | ICD-10-CM

## 2021-07-18 NOTE — Progress Notes (Signed)
?Location:   [facility] ?Nursing Home Room Number: 220 ?Place of Service:  ALF (13) ?Provider:  Veleta Miners MD ? ?Virgie Dad, MD ? ?Patient Care Team: ?Virgie Dad, MD as PCP - General (Internal Medicine) ? ?Extended Emergency Contact Information ?Primary Emergency Contact: [name], [place] ?Mobile Phone: [phone] ?Relation: Nephew ?Secondary Emergency Contact: [name] ?Mobile Phone: [phone] ?Relation: Friend ? ?Code Status:  DNR ?Goals of care: Advanced Directive information ? ?  07/18/2021  ? 11:26 AM  ?Advanced Directives  ?Does Patient Have a Medical Advance Directive? Yes  ?Type of Advance Directive Living will;Healthcare Power of Attorney  ?Does patient want to make changes to medical advance directive? No - Patient declined  ?Copy of [place] in Chart? Yes - validated most recent copy scanned in chart (See row information)  ? ? ? ?Chief Complaint  ?Patient presents with  ? Acute Visit  ?  Rash  ? ? ?HPI:  ?Pt is a 88 y.o. female seen today for an acute visit for Rash in her back Possible heat rash ? 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  ?Intolerant to Amiodarone due to Prolong QT and Recurent Falls ? ?Seen today for Rash ?In Her back Small papules all in her back ?Itching ? ? ? ?Past Medical History:  ?Diagnosis Date  ? Atrial flutter, paroxysmal ([place])   ? COVID-19   ? Dementia ([place])   ? Diastolic dysfunction   ? Hyperlipidemia   ? Hypertension   ? PSVT (paroxysmal supraventricular tachycardia) ([place])   ? Stroke [facility])   ? 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 ?History reviewed. No pertinent surgical history. ? ?Allergies  ?Allergen Reactions  ? Azopt  [Brinzolamide] Other (See Comments)  ?  UNK  ? Brimonidine Other (See Comments)  ?  UNK  ? Lumigan [Bimatoprost] Other (See Comments)  ?  UNK  ? ? ?Allergies as of 07/18/2021   ? ?   Reactions  ? Azopt [brinzolamide] Other (See Comments)  ? UNK  ? Brimonidine Other (See Comments)  ? UNK  ? Lumigan [bimatoprost] Other (See Comments)  ? UNK  ? ?  ? ?  ?Medication List  ?  ? ?  ? Accurate as of July 18, 2021 11:27 AM. If you have any questions, ask your nurse or doctor.  ?  ?  ? ?  ? ?STOP taking these medications   ? ?sertraline 25 MG tablet ?Commonly known as: ZOLOFT ?Stopped by: Virgie Dad, MD ?  ? ?  ? ?TAKE these medications   ? ?acetaminophen 325 MG tablet ?Commonly known as: TYLENOL ?Take 650 mg by mouth every 6 (six) hours as needed. ?  ?apixaban 2.5 MG Tabs tablet ?Commonly known as: ELIQUIS ?Take 1 tablet (2.5 mg total) by mouth 2 (two) times daily. ?  ?atorvastatin 10 MG tablet ?Commonly known as: LIPITOR ?Take 5 mg by mouth daily. 1/2 a tablet=5 mg ?What changed: Another medication with the same name was removed. Continue taking this medication, and follow the directions you see here. ?Changed by: Virgie Dad, MD ?  ?b complex vitamins tablet ?Take 1 tablet by mouth daily. ?  ?bismuth subsalicylate 607 PX/10GY  suspension ?Commonly known as: PEPTO BISMOL ?Take 60 mLs by mouth daily as needed for indigestion or diarrhea or loose stools. ?  ?Calcium Carbonate-Vitamin D 600-400 MG-UNIT tablet ?Take 1 tablet by mouth daily. ?  ?CENTRUM SILVER PO ?Take 1 tablet by mouth daily. 7am-10am. ?  ?lactose free nutrition Liqd ?Take 237 mLs by mouth daily. ?  ?melatonin 3 MG Tabs tablet ?Take 3 mg by mouth at bedtime. ?  ?metoprolol tartrate 12.5 mg Tabs tablet ?Commonly known as: LOPRESSOR ?Take 12.5 mg by mouth daily as needed. Use PRN if HR more then 110 and SBP more then 100 ?  ?metoprolol tartrate 25 MG tablet ?Commonly known as: LOPRESSOR ?Take 0.5 tablets (12.5 mg total) by mouth 2 (two) times daily. ?  ?NON  FORMULARY ?Moisturizing Cream to extremities with morning and evening care ?Every Shift ?  ?omeprazole 20 MG capsule ?Commonly known as: PRILOSEC ?Take 20 mg by mouth daily. ?  ?ORAJEL MT ?Use as directed in the mouth or throat 3 (three) times daily. Apply to RIGHT tongue/Inner mouth for pain ?  ?timolol 0.5 % ophthalmic solution ?Commonly known as: TIMOPTIC ?SMARTSIG:In Eye(s) ?  ?zinc oxide 20 % ointment ?Apply 1 application topically as needed for irritation. ?  ? ?  ? ? ?Review of Systems  ?Constitutional:  Negative for activity change and appetite change.  ?HENT: Negative.    ?Respiratory:  Negative for cough and shortness of breath.   ?Cardiovascular:  Negative for leg swelling.  ?Gastrointestinal:  Negative for constipation.  ?Genitourinary: Negative.   ?Musculoskeletal:  Positive for gait problem. Negative for arthralgias and myalgias.  ?Skin:  Positive for rash.  ?Neurological:  Negative for dizziness and weakness.  ?Psychiatric/Behavioral:  Positive for confusion. Negative for dysphoric mood and sleep disturbance.   ? ?Immunization History  ?Administered Date(s) Administered  ? DTaP 09/05/2004  ? Influenza, High Dose Seasonal PF 01/06/2014, 11/08/2018  ? Influenza, Quadrivalent, Recombinant, Inj, Pf 01/09/2018, 01/21/2019  ? Influenza-Unspecified 11/07/2013, 01/14/2020, 01/26/2021  ? Moderna Covid-19 Vaccine Bivalent Booster 42yr & up 04/04/2021  ? Moderna SARS-COV2 Booster Vaccination 02/22/2020, 09/06/2020  ? Moderna Sars-Covid-2 Vaccination 05/09/2019, 06/06/2019  ? Pneumococcal Conjugate-13 01/06/2014  ? Pneumococcal-Unspecified 09/05/2004, 10/18/2014  ? Zoster Recombinat (Shingrix) 05/24/2021  ? ?Pertinent  Health Maintenance Due  ?Topic Date Due  ? INFLUENZA VACCINE  11/07/2021  ? DEXA SCAN  Completed  ? ? ?  01/10/2020  ?  8:51 PM 01/11/2020  ?  8:50 AM 01/11/2020  ?  8:00 PM 01/12/2020  ?  3:00 PM 01/18/2021  ?  2:33 AM  ?Fall Risk  ?Patient Fall Risk Level High fall risk High fall risk High fall risk  High fall risk Low fall risk  ? ?Functional Status Survey: ?  ? ?Vitals:  ? 07/18/21 1037  ?BP: 118/60  ?Pulse: (!) 122  ?Resp: 16  ?Temp: 98.2 ?F (36.8 ?C)  ?SpO2: 94%  ?Weight: 110 lb 3.2 oz (50 kg)  ?Height: '4\' 5"'$  (1.346 m)  ? ?Body mass index is 27.58 kg/m?.Marland Kitchen?Physical Exam ?Vitals reviewed.  ?Constitutional:   ?   Appearance: Normal appearance.  ?HENT:  ?   Head: Normocephalic.  ?   Nose: Nose normal.  ?   Mouth/Throat:  ?   Mouth: Mucous membranes are moist.  ?   Pharynx: Oropharynx is clear.  ?Eyes:  ?   Pupils: Pupils are equal, round, and reactive to light.  ?Cardiovascular:  ?   Rate and Rhythm: Regular rhythm. Tachycardia present.  ?  Pulses: Normal pulses.  ?Pulmonary:  ?   Effort: Pulmonary effort is normal.  ?   Breath sounds: Normal breath sounds.  ?Abdominal:  ?   General: Abdomen is flat. Bowel sounds are normal.  ?   Palpations: Abdomen is soft.  ?Musculoskeletal:     ?   General: No swelling.  ?   Cervical back: Neck supple.  ?Skin: ?   General: Skin is warm.  ?   Comments: Small Papular rash in her Back through out her back  ?Neurological:  ?   General: No focal deficit present.  ?   Mental Status: She is alert.  ?Psychiatric:     ?   Mood and Affect: Mood normal.     ?   Thought Content: Thought content normal.  ? ? ?Labs reviewed: ?Recent Labs  ?  01/19/21 ?0000 06/08/21 ?0000  ?NA 135* 144  ?K 4.5 4.2  ?CL 105 109*  ?CO2 18 25*  ?BUN 36* 23*  ?CREATININE 1.2* 0.9  ?CALCIUM 8.7 8.8  ? ?Recent Labs  ?  01/19/21 ?0000 06/08/21 ?0000  ?AST 33 23  ?ALT 26 17  ?ALKPHOS 86 77  ?ALBUMIN 4.1 3.6  ? ?Recent Labs  ?  01/19/21 ?0000 06/08/21 ?0000  ?WBC 9.2 6.2  ?NEUTROABS 7,121.00 4,303.00  ?HGB 15.4 13.3  ?HCT 46 41  ?PLT 250 199  ? ?Lab Results  ?Component Value Date  ? TSH 4.45 07/08/2020  ? ?Lab Results  ?Component Value Date  ? HGBA1C 5.9 (H) 11/04/2019  ? ?Lab Results  ?Component Value Date  ? CHOL 78 06/08/2021  ? HDL 46 06/08/2021  ? [place] 18 06/08/2021  ? TRIG 49 06/08/2021  ? CHOLHDL 2.8  11/06/2019  ? ? ?Significant Diagnostic Results in last 30 days:  ?No results found. ? ?Assessment/Plan ?Rash ? Heat rash ?Try Triamcinolone 0.1 % BID for 2 weeks till resolve ? ?Paroxysmal SVT (supraven

## 2021-07-24 DIAGNOSIS — F330 Major depressive disorder, recurrent, mild: Secondary | ICD-10-CM | POA: Diagnosis not present

## 2021-07-24 DIAGNOSIS — G4700 Insomnia, unspecified: Secondary | ICD-10-CM | POA: Diagnosis not present

## 2021-08-02 ENCOUNTER — Encounter: Payer: Self-pay | Admitting: Cardiology

## 2021-08-02 ENCOUNTER — Ambulatory Visit: Payer: Medicare Other | Admitting: Cardiology

## 2021-08-02 VITALS — BP 121/75 | HR 91 | Temp 98.0°F | Resp 16 | Ht <= 58 in | Wt 113.8 lb

## 2021-08-02 DIAGNOSIS — I4892 Unspecified atrial flutter: Secondary | ICD-10-CM | POA: Diagnosis not present

## 2021-08-02 DIAGNOSIS — E782 Mixed hyperlipidemia: Secondary | ICD-10-CM | POA: Diagnosis not present

## 2021-08-02 DIAGNOSIS — Z7901 Long term (current) use of anticoagulants: Secondary | ICD-10-CM

## 2021-08-02 DIAGNOSIS — I471 Supraventricular tachycardia: Secondary | ICD-10-CM | POA: Diagnosis not present

## 2021-08-02 MED ORDER — PINDOLOL 5 MG PO TABS: 5.0000 mg | ORAL_TABLET | Freq: Two times a day (BID) | ORAL | 0 refills | Status: DC

## 2021-08-02 NOTE — Progress Notes (Signed)
? ?[name] ?Date of Birth: 03/22/1923 ?MRN: [id number] ?Primary Care Provider:Gupta, Rene Kocher, MD ?Former Cardiology Providers: Dr. Adrian Prows ?Primary Cardiologist: Rex Kras, DO, [facility] (established care 02/05/2020) ? ?Date: 08/02/21 ?Last Office Visit: 02/01/2021 ? ?Chief Complaint  ?Patient presents with  ? Follow-up  ?  88-monthfollow-up for paroxysmal atrial flutter  ? ? ?HPI  ?Bianca RIGDONis a 88y.o.  female whose past medical history and cardiovascular risk factors include: Paroxysmal SVT, paroxysmal atrial flutter, hyperlipidemia, grade 2 diastolic dysfunction, cognitive impairment suggestive of possible age-related dementia, history of stroke, postmenopausal female, advanced age. ? ?She has a known history of paroxysmal atrial flutter for which she was on amiodarone for rhythm control strategy, Lopressor for rate control, and Eliquis for thromboembolic prophylaxis.  Amiodarone was discontinued due to prolonged QT in the past and states that has been on rate control strategy. ? ?Patient presents today for 67-monthollow-up with her nephew Bianca [name] today's office visit.  Since last office visit patient states that she is doing well from a cardiovascular standpoint until last week when she has been having episodes of fast heart rate.  She is not able to provide any additional details given her underlying dementia and her nephew does not know any significant details either. ? ?Based [facility] the [facility] it appears that her ventricular rate since July 28, 2021 has been elevated.  Patient denies anginal discomfort, orthopnea, paroxysmal nocturnal nocturnal dyspnea or lower extremity swelling. ? ?No recent falls and labs independently reviewed as part of today's encounter noted below for further reference ? ?ALLERGIES: ?Allergies  ?Allergen Reactions  ? Azopt [Brinzolamide] Other (See Comments)  ?  UNK  ? Brimonidine Other (See Comments)  ?  UNK  ? Lumigan  [Bimatoprost] Other (See Comments)  ?  UNK  ? ?MEDICATION LIST PRIOR TO VISIT: ?Current Outpatient Medications on File Prior to Visit  ?Medication Sig Dispense Refill  ? acetaminophen (TYLENOL) 325 MG tablet Take 650 mg by mouth every 6 (six) hours as needed.    ? apixaban (ELIQUIS) 2.5 MG TABS tablet Take 1 tablet (2.5 mg total) by mouth 2 (two) times daily. 60 tablet 1  ? atorvastatin (LIPITOR) 10 MG tablet Take 5 mg by mouth daily. 1/2 a tablet=5 mg    ? b complex vitamins tablet Take 1 tablet by mouth daily.     ? Benzocaine (ORAJEL MT) Use as directed in the mouth or throat 3 (three) times daily. Apply to RIGHT tongue/Inner mouth for pain    ? bismuth subsalicylate (PEPTO BISMOL) 262 MG/15ML suspension Take 60 mLs by mouth daily as needed for indigestion or diarrhea or loose stools.     ? Calcium Carbonate-Vitamin D 600-400 MG-UNIT tablet Take 1 tablet by mouth daily.    ? lactose free nutrition (BOOST) LIQD Take 237 mLs by mouth daily.    ? melatonin 3 MG TABS tablet Take 3 mg by mouth at bedtime.     ? Multiple Vitamins-Minerals (CENTRUM SILVER PO) Take 1 tablet by mouth daily. 7am-10am.    ? NON FORMULARY Moisturizing Cream to extremities with morning and evening care ?Every Shift    ? omeprazole (PRILOSEC) 20 MG capsule Take 20 mg by mouth daily.     ? timolol (TIMOPTIC) 0.5 % ophthalmic solution SMARTSIG:In Eye(s)    ? zinc oxide 20 % ointment Apply 1 application topically as needed for irritation.    ? ?No current facility-administered medications  on file prior to visit.  ? ? ?PAST MEDICAL HISTORY: ?Past Medical History:  ?Diagnosis Date  ? Atrial flutter, paroxysmal ([place])   ? COVID-19   ? Dementia ([place])   ? Diastolic dysfunction   ? Hyperlipidemia   ? Hypertension   ? PSVT (paroxysmal supraventricular tachycardia) ([place])   ? Stroke [facility])   ? 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 ? ?PAST SURGICAL HISTORY: ?History reviewed. No pertinent  surgical history. ? ?FAMILY HISTORY: ?No family history of premature coronary disease or sudden cardiac death. ?  ?SOCIAL HISTORY:  ?The patient  reports that she has never smoked. She has never used smokeless tobacco. She reports that she does not drink alcohol and does not use drugs. ? ?Review of Systems  ?Cardiovascular:  Negative for chest pain, dyspnea on exertion, leg swelling, palpitations and syncope.  ?Respiratory:  Negative for cough and shortness of breath.   ?Hematologic/Lymphatic: Negative for bleeding problem.  ?Neurological:   ?     Baseline dementia.  ? ?PHYSICAL EXAM: ? ?  08/02/2021  ? 10:35 AM 07/18/2021  ? 10:37 AM 05/30/2021  ? 11:50 AM  ?Vitals with BMI  ?Height '4\' 5"'$  '4\' 5"'$  '4\' 5"'$   ?Weight 113 lbs 13 oz 110 lbs 3 oz 108 lbs 3 oz  ?BMI 28.49 27.59 27.09  ?Systolic 585 277 824  ?Diastolic 75 60 70  ?Pulse 91 122 52  ? ? ?CONSTITUTIONAL: Elderly frail appearing female, uses a walker, hemodynamically stable, no acute distress.  ?SKIN: Skin is warm and dry. No rash noted. No cyanosis. No pallor. No jaundice ?HEAD: Normocephalic and atraumatic.  ?EYES: No scleral icterus ?MOUTH/THROAT: Moist oral membranes.  ?NECK: No JVD present. No thyromegaly noted. No carotid bruits  ?LYMPHATIC: No visible cervical adenopathy.  ?CHEST Normal respiratory effort. No intercostal retractions  ?LUNGS: Clear to auscultation bilaterally. No stridor. No wheezes. No rales.  ?CARDIOVASCULAR: Tachycardic, regular, positive M3-N3, holosystolic murmur heard at the left sternal border, no gallops or rubs ?ABDOMINAL: soft, nontender, nondistended, positive bowel sounds all 4 quadrants. No apparent ascites.  ?EXTREMITIES: No peripheral edema. Faint DP and PT. skin changes consistent with chronic venous insufficiency ?HEMATOLOGIC: ecchymosis noted.  ?NEUROLOGIC: Oriented to person, place, and time. Nonfocal. Normal muscle tone.  ?PSYCHIATRIC: Normal mood and affect. Normal behavior. Cooperative ? ?CARDIAC DATABASE: ?EKG: ?02/01/2021:  Normal sinus rhythm, 60 bpm, left axis deviation, poor R wave progression, without underlying injury pattern.  QT 424 ms. ? ?08/02/2021: Junctional tachycardia with retrograde P wave conduction, poor R wave progression.  Prior EKG notes normal sinus rhythm. ? ?Echocardiogram: ?10/27/2019:  ?1. Left ventricular ejection fraction, by estimation, is 60 to 65%. The  left ventricle has normal function. The left ventricle has no regional  ?wall motion abnormalities. There is mild left ventricular hypertrophy.  Left ventricular diastolic parameters  ?are consistent with Grade II diastolic dysfunction (pseudonormalization).  Elevated left atrial pressure.  ? 2. Right ventricular systolic function is normal. The right ventricular  size is normal. There is mildly elevated pulmonary artery systolic  ?pressure. The estimated right ventricular systolic pressure is 61.4 mmHg.  ? 3. Right atrial size was mildly dilated.  ? 4. The mitral valve is normal in structure. Trivial mitral valve  regurgitation.  ? 5. Tricuspid valve regurgitation is moderate to severe.  ? 6. The aortic valve is tricuspid. Aortic valve regurgitation is mild.  Mild aortic valve sclerosis is present, with no evidence of aortic valve stenosis.  ?  7. The inferior vena cava is normal in size with greater than 50%  respiratory variability, suggesting right atrial pressure of 3 mmHg.  ?  ?Carotid artery duplex  11/06/2019: ?Right Carotid: Velocities in the right ICA are consistent with a 1-39%  stenosis.  ?Left Carotid: Velocities in the left ICA are consistent with a 1-39%  stenosis.  ?Vertebrals: Bilateral vertebral arteries demonstrate antegrade flow.  ? ?LABORATORY DATA: ? ?  Latest Ref Rng & Units 06/08/2021  ? 12:00 AM 01/19/2021  ? 12:00 AM 07/08/2020  ? 12:00 AM  ?CBC  ?WBC  6.2      9.2      5.5       ?Hemoglobin 12.0 - 16.0 13.3      15.4      14.0       ?Hematocrit 36 - 46 41      46      44       ?Platelets 150 - 400 K/uL 199      250      222       ?  ?  This result is from an external source.  ? ? ? ?  Latest Ref Rng & Units 06/08/2021  ? 12:00 AM 01/19/2021  ? 12:00 AM 07/08/2020  ? 12:00 AM  ?CMP  ?BUN 4 - 21 23      36      35       ?Creatinine 0.5 - 1.1 0.9

## 2021-08-03 ENCOUNTER — Encounter: Payer: Self-pay | Admitting: Nurse Practitioner

## 2021-08-03 ENCOUNTER — Non-Acute Institutional Stay: Payer: Medicare Other | Admitting: Nurse Practitioner

## 2021-08-03 DIAGNOSIS — N1831 Chronic kidney disease, stage 3a: Secondary | ICD-10-CM | POA: Diagnosis not present

## 2021-08-03 DIAGNOSIS — F339 Major depressive disorder, recurrent, unspecified: Secondary | ICD-10-CM

## 2021-08-03 DIAGNOSIS — R609 Edema, unspecified: Secondary | ICD-10-CM | POA: Diagnosis not present

## 2021-08-03 DIAGNOSIS — D509 Iron deficiency anemia, unspecified: Secondary | ICD-10-CM

## 2021-08-03 DIAGNOSIS — I639 Cerebral infarction, unspecified: Secondary | ICD-10-CM

## 2021-08-03 DIAGNOSIS — K219 Gastro-esophageal reflux disease without esophagitis: Secondary | ICD-10-CM

## 2021-08-03 DIAGNOSIS — E782 Mixed hyperlipidemia: Secondary | ICD-10-CM

## 2021-08-03 DIAGNOSIS — K146 Glossodynia: Secondary | ICD-10-CM

## 2021-08-03 DIAGNOSIS — F01B0 Vascular dementia, moderate, without behavioral disturbance, psychotic disturbance, mood disturbance, and anxiety: Secondary | ICD-10-CM | POA: Diagnosis not present

## 2021-08-03 DIAGNOSIS — I471 Supraventricular tachycardia: Secondary | ICD-10-CM | POA: Diagnosis not present

## 2021-08-04 ENCOUNTER — Encounter: Payer: Self-pay | Admitting: Nurse Practitioner

## 2021-08-04 NOTE — Assessment & Plan Note (Signed)
Small ulcer mid right tongue, redness entire tongue, will try Magic Mouth wash 68m s/s x 7 days.  ?

## 2021-08-04 NOTE — Assessment & Plan Note (Signed)
Bun/creat 23/0.9 06/08/21 ?

## 2021-08-04 NOTE — Assessment & Plan Note (Signed)
01/21/20 Iron 44, ferritin 63, Vit B12 695, Hgb 13.3 06/08/21 ?

## 2021-08-04 NOTE — Assessment & Plan Note (Signed)
off Amiodarone 01/2020 in hospital due to prolonged QT interval. On Pindolol started by Cardiology, prn if HR >100, asymptomatic, on Eliquis.  ?

## 2021-08-04 NOTE — Assessment & Plan Note (Signed)
11/02/19 CVA (MRI cevebral small acute infarcts in the posterior left hemisphere in the left PCA and MCA watershed area. Takes Eliquis.  

## 2021-08-04 NOTE — Assessment & Plan Note (Signed)
Her mood is stable, takes Sertraline 

## 2021-08-04 NOTE — Assessment & Plan Note (Signed)
Vascular dementia since CVA, no behavioral issues. MMSE 20/30 10/28/19 ?

## 2021-08-04 NOTE — Assessment & Plan Note (Signed)
Stable,  takes Omeprazole. Hgb 13.3 06/08/21 ?

## 2021-08-04 NOTE — Progress Notes (Signed)
?Location:  [facility] ?Nursing Home Room Number: 938/H ?Place of Service:  ALF (13) ?Provider:  Perlie Scheuring X, NP ? ? ?Virgie Dad, MD ? ?Patient Care Team: ?Virgie Dad, MD as PCP - General (Internal Medicine) ? ?Extended Emergency Contact Information ?Primary Emergency Contact: [name], [place] ?Mobile Phone: [phone] ?Relation: Nephew ?Secondary Emergency Contact: [name] ?Mobile Phone: [phone] ?Relation: Friend ? ?Code Status:   ?Goals of care: Advanced Directive information ? ?  08/04/2021  ?  8:48 AM  ?Advanced Directives  ?Does Patient Have a Medical Advance Directive? Yes  ?Type of Advance Directive Living will;Healthcare Power of Attorney  ?Does patient want to make changes to medical advance directive? No - Patient declined  ?Copy of [place] in Chart? Yes - validated most recent copy scanned in chart (See row information)  ? ? ? ?Chief Complaint  ?Patient presents with  ? Medical Management of Chronic Issues  ?  Patient is here for a follow up for chronic conditions ?  ? ? ?HPI:  ?Pt is a 88 y.o. female seen today for managing chronic medical conditions.  ?  ? Sore, beefy red tongue and mouth.  ? Edema, not apparent, off Torsemide.  ?            PSVT off Amiodarone 01/2020 in hospital due to prolonged QT interval. On Pindolol started by Cardiology, prn if HR >100, asymptomatic, on Eliquis.  ?            GERD, takes Omeprazole. Hgb 13.3 06/08/21 ?            Depression, takes Sertraline.  ?            CKD Bun/creat 23/0.9 06/08/21 ?            Vascular dementia since CVA, no behavioral issues. MMSE 20/30 10/28/19 ?            11/02/19 CVA (MRI cevebral small acute infarcts in the posterior left hemisphere in the left PCA and MCA watershed area. Takes Eliquis.  ?            Anemia, 01/21/20 Iron 44, ferritin 63, Vit B12 695, Hgb 13.3 06/08/21 ? Hyperlipidemia, takes Atorvastatin ?  ?Past Medical History:  ?Diagnosis Date  ? Atrial flutter, paroxysmal ([place])   ?  COVID-19   ? Dementia ([place])   ? Diastolic dysfunction   ? Hyperlipidemia   ? Hypertension   ? PSVT (paroxysmal supraventricular tachycardia) ([place])   ? Stroke [facility])   ? 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 ?History reviewed. No pertinent surgical history. ? ?Allergies  ?Allergen Reactions  ? Azopt [Brinzolamide] Other (See Comments)  ?  UNK  ? Brimonidine Other (See Comments)  ?  UNK  ? Lumigan [Bimatoprost] Other (See Comments)  ?  UNK  ? ? ?Outpatient Encounter Medications as of 08/03/2021  ?Medication Sig  ? acetaminophen (TYLENOL) 325 MG tablet Take 650 mg by mouth every 6 (six) hours as needed.  ? apixaban (ELIQUIS) 2.5 MG TABS tablet Take 1 tablet (2.5 mg total) by mouth 2 (two) times daily.  ? atorvastatin (LIPITOR) 10 MG tablet Take 5 mg by mouth daily. 1/2 a tablet=5 mg  ? b complex vitamins tablet Take 1 tablet by mouth daily.   ? Benzocaine (ORAJEL MT) Use as directed in the mouth or throat 3 (three) times daily. Apply to RIGHT tongue/Inner mouth for pain  ?  bismuth subsalicylate (PEPTO BISMOL) 262 MG/15ML suspension Take 60 mLs by mouth daily as needed for indigestion or diarrhea or loose stools.   ? Calcium Carbonate-Vitamin D 600-400 MG-UNIT tablet Take 1 tablet by mouth daily.  ? lactose free nutrition (BOOST) LIQD Take 237 mLs by mouth daily.  ? magic mouthwash SOLN Take 5 mLs by mouth 4 (four) times daily. magic mouth wash MANXIE Kaye Mitro NP ?liquid; -----------; amt: 5 ml; oral  ?Special Instructions: swish and spit out ?Four Times A Day; 08:00 AM, 12:00 PM, 04:00 PM, 08:00 PM  ? melatonin 3 MG TABS tablet Take 3 mg by mouth at bedtime.   ? Multiple Vitamins-Minerals (CENTRUM SILVER PO) Take 1 tablet by mouth daily. 7am-10am.  ? NON FORMULARY Moisturizing Cream to extremities with morning and evening care ?Every Shift  ? omeprazole (PRILOSEC) 20 MG capsule Take 20 mg by mouth daily.   ? pindolol (VISKEN) 5 MG tablet Take 1 tablet (5 mg  total) by mouth 2 (two) times daily. Hold if systolic blood pressure (top number) less than 100 mmHg or pulse less than 60 bpm.  ? timolol (TIMOPTIC) 0.5 % ophthalmic solution SMARTSIG:In Eye(s)  ? zinc oxide 20 % ointment Apply 1 application topically as needed for irritation.  ? ?No facility-administered encounter medications on file as of 08/03/2021.  ? ? ?Review of Systems  ?Constitutional:  Negative for activity change, appetite change and fever.  ?HENT:  Positive for hearing loss and mouth sores. Negative for congestion, dental problem, facial swelling, sinus pain, sore throat and trouble swallowing.   ?Eyes:  Negative for visual disturbance.  ?Respiratory:  Negative for shortness of breath.   ?Cardiovascular:  Positive for leg swelling.  ?Gastrointestinal:  Negative for abdominal pain and constipation.  ?Genitourinary:  Positive for frequency. Negative for dysuria and urgency.  ?Musculoskeletal:  Positive for arthralgias and gait problem.  ?Skin:  Positive for wound.  ?Neurological:  Negative for speech difficulty, weakness and light-headedness.  ?     Memory lapses.   ?Psychiatric/Behavioral:  Positive for confusion. Negative for sleep disturbance. The patient is not nervous/anxious.   ?     Baseline mentation.   ? ?Immunization History  ?Administered Date(s) Administered  ? DTaP 09/05/2004  ? Influenza, High Dose Seasonal PF 01/06/2014, 11/08/2018  ? Influenza, Quadrivalent, Recombinant, Inj, Pf 01/09/2018, 01/21/2019  ? Influenza-Unspecified 11/07/2013, 01/14/2020, 01/26/2021  ? Moderna Covid-19 Vaccine Bivalent Booster 64yr & up 04/04/2021  ? Moderna SARS-COV2 Booster Vaccination 02/22/2020, 09/06/2020  ? Moderna Sars-Covid-2 Vaccination 05/09/2019, 06/06/2019  ? Pneumococcal Conjugate-13 01/06/2014  ? Pneumococcal-Unspecified 09/05/2004, 10/18/2014  ? Zoster Recombinat (Shingrix) 05/24/2021  ? ?Pertinent  Health Maintenance Due  ?Topic Date Due  ? INFLUENZA VACCINE  11/07/2021  ? DEXA SCAN  Completed   ? ? ?  01/10/2020  ?  8:51 PM 01/11/2020  ?  8:50 AM 01/11/2020  ?  8:00 PM 01/12/2020  ?  3:00 PM 01/18/2021  ?  2:33 AM  ?Fall Risk  ?Patient Fall Risk Level High fall risk High fall risk High fall risk High fall risk Low fall risk  ? ?Functional Status Survey: ?  ? ?Vitals:  ? 08/03/21 0849  ?BP: 118/62  ?Pulse: (!) 113  ?Resp: 20  ?Temp: (!) 97.3 ?F (36.3 ?C)  ?SpO2: 96%  ?Weight: 110 lb 3.2 oz (50 kg)  ?Height: '4\' 7"'$  (1.397 m)  ? ?Body mass index is 25.61 kg/m?.Marland Kitchen?Physical Exam ?Vitals and nursing note reviewed.  ?Constitutional:   ?   Appearance:  Normal appearance.  ?HENT:  ?   Head: Normocephalic and atraumatic.  ?   Nose: Nose normal.  ?   Mouth/Throat:  ?   Mouth: Mucous membranes are moist.  ?   Comments: Beefy red oral cavity linings and tongue, a small ulcer on the top of the right side of tongue.  ?Eyes:  ?   Extraocular Movements: Extraocular movements intact.  ?   Conjunctiva/sclera: Conjunctivae normal.  ?   Pupils: Pupils are equal, round, and reactive to light.  ?Cardiovascular:  ?   Rate and Rhythm: Regular rhythm. Tachycardia present.  ?   Heart sounds: No murmur heard. ?Pulmonary:  ?   Effort: Pulmonary effort is normal.  ?   Breath sounds: No rales.  ?Abdominal:  ?   General: Bowel sounds are normal.  ?   Palpations: Abdomen is soft.  ?   Tenderness: There is no abdominal tenderness.  ?Musculoskeletal:  ?   Cervical back: Normal range of motion and neck supple.  ?   Right lower leg: Edema present.  ?   Left lower leg: No edema.  ?   Comments: able to walk/bear weight. Mild swelling RLE in the delayed healing wound region.   ?Skin: ?   General: Skin is warm and dry.  ?   Findings: Erythema present.  ?   Comments: The medial right lower leg laceration, suture removed, wound edge rolled in center of the laceration, small tunneling wound formed, mild erythema, warmth, and swelling, overall improving.  ?  ?Neurological:  ?   General: No focal deficit present.  ?   Mental Status: She is alert and  oriented to person, place, and time. Mental status is at baseline.  ?   Motor: No weakness.  ?   Coordination: Coordination normal.  ?   Gait: Gait abnormal.  ?Psychiatric:     ?   Mood and Affect: Mood normal.     ?   B

## 2021-08-04 NOTE — Assessment & Plan Note (Signed)
not apparent, off Torsemide.  ?

## 2021-08-04 NOTE — Assessment & Plan Note (Signed)
Continue Atorvastatin

## 2021-08-11 ENCOUNTER — Non-Acute Institutional Stay: Payer: Medicare Other | Admitting: Orthopedic Surgery

## 2021-08-11 ENCOUNTER — Telehealth: Payer: Self-pay | Admitting: Cardiology

## 2021-08-11 ENCOUNTER — Encounter: Payer: Self-pay | Admitting: Orthopedic Surgery

## 2021-08-11 DIAGNOSIS — N1831 Chronic kidney disease, stage 3a: Secondary | ICD-10-CM | POA: Diagnosis not present

## 2021-08-11 DIAGNOSIS — R000 Tachycardia, unspecified: Secondary | ICD-10-CM

## 2021-08-11 DIAGNOSIS — K219 Gastro-esophageal reflux disease without esophagitis: Secondary | ICD-10-CM | POA: Diagnosis not present

## 2021-08-11 DIAGNOSIS — I471 Supraventricular tachycardia: Secondary | ICD-10-CM | POA: Diagnosis not present

## 2021-08-11 DIAGNOSIS — F01B0 Vascular dementia, moderate, without behavioral disturbance, psychotic disturbance, mood disturbance, and anxiety: Secondary | ICD-10-CM | POA: Diagnosis not present

## 2021-08-11 NOTE — Telephone Encounter (Signed)
Called no answer left a vm

## 2021-08-11 NOTE — Telephone Encounter (Signed)
Malachy Mood called back regarding patient that has been having elevated pulse since starting pindolol. Patient was given a dose of lopressor what she was on before being discontinued and started on pindolol. Bp have been around 90-100/70 patient is going to be seen by a NP and they will send any orders or ov notes

## 2021-08-11 NOTE — Telephone Encounter (Signed)
Bianca [name] with [facility] says patient is having trouble with new medication ST put her on. Says her pulse has been 150, highest she's seen it. Please call her at [phone] ext [phone]. ?

## 2021-08-11 NOTE — Progress Notes (Signed)
?Location:  [facility] ?Nursing Home Room Number: [id number] ?Place of Service:  ALF (13) ?Provider: Yvonna Alanis, NP ? ?Patient Care Team: ?Virgie Dad, MD as PCP - General (Internal Medicine) ? ?Extended Emergency Contact Information ?Primary Emergency Contact: [name], [place] ?Mobile Phone: [phone] ?Relation: Nephew ?Secondary Emergency Contact: [name] ?Mobile Phone: [phone] ?Relation: Friend ? ?Code Status:  DNR ?Goals of care: Advanced Directive information ? ?  08/11/2021  ? 12:30 PM  ?Advanced Directives  ?Does Patient Have a Medical Advance Directive? Yes  ?Type of Advance Directive Living will;Healthcare Power of Attorney  ?Does patient want to make changes to medical advance directive? No - Patient declined  ?Copy of [place] in Chart? Yes - validated most recent copy scanned in chart (See row information)  ? ? ? ?Chief Complaint  ?Patient presents with  ? Acute Visit  ?  Tachycardia  ? ? ?HPI:  ?Pt is a 88 y.o. female seen today for an acute visit for tachycardia.  ? ?Cardiac history of PAF- amiodarone intolerance due to prolonged QT/falls, HLD, grade 2 diastolic dysfuntion, h/o stroke. Diagnosed with Junctional tachycardia by Dr. Terri Skains 04/26. Lopressor was discontinued and pindolol 5 mg bid started. Today, nursing reports HR 130-140's. SBP> 100. She reports being slightly short of breath, poor historian due to dementia. She was given a one time dose of lopressor 25 mg and HR came down < 120 bpm. Afebrile. Manual bp 110/88. I discussed HR changes with Dr. Terri Skains nurse. Advised to discontinue pindolol and restart previous lopressor.  ? ?Dementia- no behavioral outbursts, continues to do well in AL, ambulates with walker ?PAF- see above, remains on Eliquis for clot prevention ?CKD- BUN/creat 23/0.9 06/08/2021 ?GERD- hgb 13.3 06/08/2021, remains on Omeprazole ? ?Past Medical History:  ?Diagnosis Date  ? Atrial flutter, paroxysmal ([place])   ? COVID-19   ? Dementia  ([place])   ? Diastolic dysfunction   ? Hyperlipidemia   ? Hypertension   ? PSVT (paroxysmal supraventricular tachycardia) ([place])   ? Stroke [facility])   ? 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 ?History reviewed. No pertinent surgical history. ? ?Allergies  ?Allergen Reactions  ? Azopt [Brinzolamide] Other (See Comments)  ?  UNK  ? Brimonidine Other (See Comments)  ?  UNK  ? Lumigan [Bimatoprost] Other (See Comments)  ?  UNK  ? ? ?Outpatient Encounter Medications as of 08/11/2021  ?Medication Sig  ? acetaminophen (TYLENOL) 325 MG tablet Take 650 mg by mouth every 6 (six) hours as needed.  ? apixaban (ELIQUIS) 2.5 MG TABS tablet Take 1 tablet (2.5 mg total) by mouth 2 (two) times daily.  ? atorvastatin (LIPITOR) 10 MG tablet Take 5 mg by mouth daily. 1/2 a tablet=5 mg  ? b complex vitamins tablet Take 1 tablet by mouth daily.   ? Benzocaine (ORAJEL MT) Use as directed in the mouth or throat 3 (three) times daily. Apply to RIGHT tongue/Inner mouth for pain  ? bismuth subsalicylate (PEPTO BISMOL) 262 MG/15ML suspension Take 60 mLs by mouth daily as needed for indigestion or diarrhea or loose stools.   ? Calcium Carbonate-Vitamin D 600-400 MG-UNIT tablet Take 1 tablet by mouth daily.  ? lactose free nutrition (BOOST) LIQD Take 237 mLs by mouth daily.  ? melatonin 3 MG TABS tablet Take 3 mg by mouth at bedtime.   ? Multiple Vitamins-Minerals (CENTRUM SILVER PO) Take 1 tablet by mouth daily. 7am-10am.  ? omeprazole (  PRILOSEC) 20 MG capsule Take 20 mg by mouth daily.   ? pindolol (VISKEN) 5 MG tablet Take 1 tablet (5 mg total) by mouth 2 (two) times daily. Hold if systolic blood pressure (top number) less than 100 mmHg or pulse less than 60 bpm.  ? sodium fluoride (SODIUM FLUORIDE 5000 PPM) 1.1 % CREA dental cream Place 1 application. onto teeth every evening. Brush on teeth with a toothbrush after PM mouth care. Spit  ?out excess and do not rinse  ? timolol (TIMOPTIC) 0.5  % ophthalmic solution SMARTSIG:In Eye(s)  ? triamcinolone ointment (KENALOG) 0.1 % Apply 1 application. topically 2 (two) times daily.  ? zinc oxide 20 % ointment Apply 1 application topically as needed for irritation.  ? [DISCONTINUED] NON FORMULARY Moisturizing Cream to extremities with morning and evening care ?Every Shift  ? ?No facility-administered encounter medications on file as of 08/11/2021.  ? ? ?Review of Systems  ?Unable to perform ROS: Dementia  ? ?Immunization History  ?Administered Date(s) Administered  ? DTaP 09/05/2004  ? Influenza, High Dose Seasonal PF 01/06/2014, 11/08/2018  ? Influenza, Quadrivalent, Recombinant, Inj, Pf 01/09/2018, 01/21/2019  ? Influenza-Unspecified 11/07/2013, 01/14/2020, 01/26/2021  ? Moderna Covid-19 Vaccine Bivalent Booster 42yr & up 04/04/2021  ? Moderna SARS-COV2 Booster Vaccination 02/22/2020, 09/06/2020  ? Moderna Sars-Covid-2 Vaccination 05/09/2019, 06/06/2019  ? Pneumococcal Conjugate-13 01/06/2014  ? Pneumococcal-Unspecified 09/05/2004, 10/18/2014  ? Zoster Recombinat (Shingrix) 05/24/2021  ? ?Pertinent  Health Maintenance Due  ?Topic Date Due  ? INFLUENZA VACCINE  11/07/2021  ? DEXA SCAN  Completed  ? ? ?  01/10/2020  ?  8:51 PM 01/11/2020  ?  8:50 AM 01/11/2020  ?  8:00 PM 01/12/2020  ?  3:00 PM 01/18/2021  ?  2:33 AM  ?Fall Risk  ?Patient Fall Risk Level High fall risk High fall risk High fall risk High fall risk Low fall risk  ? ?Functional Status Survey: ?  ? ?Vitals:  ? 08/11/21 1223  ?BP: 115/80  ?Pulse: (!) 148  ?Resp: 20  ?Temp: (!) 97.4 ?F (36.3 ?C)  ?SpO2: 99%  ?Weight: 112 lb 6.4 oz (51 kg)  ?Height: '4\' 5"'$  (1.346 m)  ? ?Body mass index is 28.13 kg/m?.Marland Kitchen?Physical Exam ?Vitals reviewed.  ?Constitutional:   ?   General: She is not in acute distress. ?HENT:  ?   Head: Normocephalic.  ?Eyes:  ?   General:     ?   Right eye: No discharge.     ?   Left eye: No discharge.  ?Cardiovascular:  ?   Rate and Rhythm: Regular rhythm. Tachycardia present.  ?   Pulses: Normal  pulses.  ?   Heart sounds: Normal heart sounds.  ?Pulmonary:  ?   Effort: Pulmonary effort is normal. No respiratory distress.  ?   Breath sounds: Normal breath sounds. No wheezing.  ?Abdominal:  ?   General: Bowel sounds are normal. There is no distension.  ?   Palpations: Abdomen is soft.  ?   Tenderness: There is no abdominal tenderness.  ?Musculoskeletal:  ?   Cervical back: Neck supple.  ?   Right lower leg: No edema.  ?   Left lower leg: No edema.  ?Skin: ?   General: Skin is warm and dry.  ?   Capillary Refill: Capillary refill takes less than 2 seconds.  ?Neurological:  ?   General: No focal deficit present.  ?   Mental Status: She is alert.  ?   Motor: Weakness present.  ?  Gait: Gait abnormal.  ?Psychiatric:     ?   Mood and Affect: Mood normal.     ?   Behavior: Behavior normal.  ? ? ?Labs reviewed: ?Recent Labs  ?  01/19/21 ?0000 06/08/21 ?0000  ?NA 135* 144  ?K 4.5 4.2  ?CL 105 109*  ?CO2 18 25*  ?BUN 36* 23*  ?CREATININE 1.2* 0.9  ?CALCIUM 8.7 8.8  ? ?Recent Labs  ?  01/19/21 ?0000 06/08/21 ?0000  ?AST 33 23  ?ALT 26 17  ?ALKPHOS 86 77  ?ALBUMIN 4.1 3.6  ? ?Recent Labs  ?  01/19/21 ?0000 06/08/21 ?0000  ?WBC 9.2 6.2  ?NEUTROABS 7,121.00 4,303.00  ?HGB 15.4 13.3  ?HCT 46 41  ?PLT 250 199  ? ?Lab Results  ?Component Value Date  ? TSH 4.45 07/08/2020  ? ?Lab Results  ?Component Value Date  ? HGBA1C 5.9 (H) 11/04/2019  ? ?Lab Results  ?Component Value Date  ? CHOL 78 06/08/2021  ? HDL 46 06/08/2021  ? [place] 18 06/08/2021  ? TRIG 49 06/08/2021  ? CHOLHDL 2.8 11/06/2019  ? ? ?Significant Diagnostic Results in last 30 days:  ?No results found. ? ?Assessment/Plan ?1. Tachycardia ?- diagnosed with junctional tachycardia 04/26- lopressor stopped/pindolol started ?- HR reduced  < 120 with one time lopressor 25 mg  ?- discussed HR changes with cardiology- advised to restart lopressor and discontinue pindolol ?- start lopressor 12.5 mg po bid  ?- start lopressor 12.5 mg po daily prn if HR > 110 and SBP >  100 ? ?2. Moderate vascular dementia without behavioral disturbance, psychotic disturbance, mood disturbance, or anxiety ([place]) ?- doing well in AL ?- no behavioral outbursts ? ?3. Paroxysmal SVT (supraventric

## 2021-08-13 ENCOUNTER — Inpatient Hospital Stay (HOSPITAL_COMMUNITY): Payer: Medicare Other

## 2021-08-13 ENCOUNTER — Inpatient Hospital Stay (HOSPITAL_COMMUNITY)
Admission: EM | Admit: 2021-08-13 | Discharge: 2021-08-16 | DRG: 309 | Disposition: A | Discharge status: Skilled Nursing Facility | Payer: Medicare Other | Source: Skilled Nursing Facility | Attending: Internal Medicine | Admitting: Internal Medicine

## 2021-08-13 ENCOUNTER — Emergency Department (HOSPITAL_COMMUNITY): Payer: Medicare Other

## 2021-08-13 ENCOUNTER — Encounter (HOSPITAL_COMMUNITY): Payer: Self-pay | Admitting: Emergency Medicine

## 2021-08-13 ENCOUNTER — Other Ambulatory Visit: Payer: Self-pay

## 2021-08-13 DIAGNOSIS — Z7901 Long term (current) use of anticoagulants: Secondary | ICD-10-CM

## 2021-08-13 DIAGNOSIS — N1831 Chronic kidney disease, stage 3a: Secondary | ICD-10-CM

## 2021-08-13 DIAGNOSIS — Z8673 Personal history of transient ischemic attack (TIA), and cerebral infarction without residual deficits: Secondary | ICD-10-CM

## 2021-08-13 DIAGNOSIS — E860 Dehydration: Secondary | ICD-10-CM | POA: Diagnosis not present

## 2021-08-13 DIAGNOSIS — I130 Hypertensive heart and chronic kidney disease with heart failure and stage 1 through stage 4 chronic kidney disease, or unspecified chronic kidney disease: Secondary | ICD-10-CM | POA: Diagnosis not present

## 2021-08-13 DIAGNOSIS — R279 Unspecified lack of coordination: Secondary | ICD-10-CM | POA: Diagnosis not present

## 2021-08-13 DIAGNOSIS — Z888 Allergy status to other drugs, medicaments and biological substances status: Secondary | ICD-10-CM

## 2021-08-13 DIAGNOSIS — R000 Tachycardia, unspecified: Secondary | ICD-10-CM | POA: Diagnosis not present

## 2021-08-13 DIAGNOSIS — K219 Gastro-esophageal reflux disease without esophagitis: Secondary | ICD-10-CM | POA: Diagnosis not present

## 2021-08-13 DIAGNOSIS — N184 Chronic kidney disease, stage 4 (severe): Secondary | ICD-10-CM | POA: Diagnosis present

## 2021-08-13 DIAGNOSIS — Z66 Do not resuscitate: Secondary | ICD-10-CM | POA: Diagnosis present

## 2021-08-13 DIAGNOSIS — I5189 Other ill-defined heart diseases: Secondary | ICD-10-CM

## 2021-08-13 DIAGNOSIS — F0390 Unspecified dementia without behavioral disturbance: Secondary | ICD-10-CM | POA: Diagnosis not present

## 2021-08-13 DIAGNOSIS — R531 Weakness: Secondary | ICD-10-CM | POA: Diagnosis not present

## 2021-08-13 DIAGNOSIS — I4892 Unspecified atrial flutter: Secondary | ICD-10-CM | POA: Diagnosis present

## 2021-08-13 DIAGNOSIS — E8722 Chronic metabolic acidosis: Secondary | ICD-10-CM | POA: Diagnosis not present

## 2021-08-13 DIAGNOSIS — I4719 Other supraventricular tachycardia: Secondary | ICD-10-CM

## 2021-08-13 DIAGNOSIS — R1312 Dysphagia, oropharyngeal phase: Secondary | ICD-10-CM | POA: Diagnosis not present

## 2021-08-13 DIAGNOSIS — I5032 Chronic diastolic (congestive) heart failure: Secondary | ICD-10-CM | POA: Diagnosis present

## 2021-08-13 DIAGNOSIS — N183 Chronic kidney disease, stage 3 unspecified: Secondary | ICD-10-CM | POA: Diagnosis not present

## 2021-08-13 DIAGNOSIS — Z79899 Other long term (current) drug therapy: Secondary | ICD-10-CM | POA: Diagnosis not present

## 2021-08-13 DIAGNOSIS — K146 Glossodynia: Secondary | ICD-10-CM | POA: Diagnosis not present

## 2021-08-13 DIAGNOSIS — I4891 Unspecified atrial fibrillation: Secondary | ICD-10-CM | POA: Diagnosis not present

## 2021-08-13 DIAGNOSIS — R4189 Other symptoms and signs involving cognitive functions and awareness: Secondary | ICD-10-CM | POA: Diagnosis present

## 2021-08-13 DIAGNOSIS — E782 Mixed hyperlipidemia: Secondary | ICD-10-CM | POA: Diagnosis not present

## 2021-08-13 DIAGNOSIS — I480 Paroxysmal atrial fibrillation: Secondary | ICD-10-CM | POA: Diagnosis present

## 2021-08-13 DIAGNOSIS — H353231 Exudative age-related macular degeneration, bilateral, with active choroidal neovascularization: Secondary | ICD-10-CM | POA: Diagnosis present

## 2021-08-13 DIAGNOSIS — Z9181 History of falling: Secondary | ICD-10-CM

## 2021-08-13 DIAGNOSIS — J90 Pleural effusion, not elsewhere classified: Secondary | ICD-10-CM | POA: Diagnosis not present

## 2021-08-13 DIAGNOSIS — Z7401 Bed confinement status: Secondary | ICD-10-CM | POA: Diagnosis not present

## 2021-08-13 DIAGNOSIS — F01B0 Vascular dementia, moderate, without behavioral disturbance, psychotic disturbance, mood disturbance, and anxiety: Secondary | ICD-10-CM | POA: Diagnosis not present

## 2021-08-13 DIAGNOSIS — M6281 Muscle weakness (generalized): Secondary | ICD-10-CM | POA: Diagnosis not present

## 2021-08-13 DIAGNOSIS — R001 Bradycardia, unspecified: Secondary | ICD-10-CM | POA: Diagnosis not present

## 2021-08-13 DIAGNOSIS — R031 Nonspecific low blood-pressure reading: Secondary | ICD-10-CM | POA: Diagnosis present

## 2021-08-13 DIAGNOSIS — I471 Supraventricular tachycardia: Secondary | ICD-10-CM | POA: Diagnosis not present

## 2021-08-13 DIAGNOSIS — J9811 Atelectasis: Secondary | ICD-10-CM | POA: Diagnosis not present

## 2021-08-13 DIAGNOSIS — R2681 Unsteadiness on feet: Secondary | ICD-10-CM | POA: Diagnosis not present

## 2021-08-13 LAB — TROPONIN I (HIGH SENSITIVITY)
Troponin I (High Sensitivity): 15 ng/L (ref <18)
Troponin I (High Sensitivity): 15 ng/L (ref <18)

## 2021-08-13 LAB — CBC WITH DIFFERENTIAL/PLATELET
Abs Immature Granulocytes: 0.03 10*3/uL (ref 0.00–0.07)
Basophils Absolute: 0 10*3/uL (ref 0.0–0.1)
Basophils Relative: 0 %
Eosinophils Absolute: 0.1 10*3/uL (ref 0.0–0.5)
Eosinophils Relative: 1 %
HCT: 46.1 % — ABNORMAL HIGH (ref 36.0–46.0)
Hemoglobin: 14.3 g/dL (ref 12.0–15.0)
Immature Granulocytes: 0 %
Lymphocytes Relative: 13 %
Lymphs Abs: 1.1 10*3/uL (ref 0.7–4.0)
MCH: 30.3 pg (ref 26.0–34.0)
MCHC: 31 g/dL (ref 30.0–36.0)
MCV: 97.7 fL (ref 80.0–100.0)
Monocytes Absolute: 0.6 10*3/uL (ref 0.1–1.0)
Monocytes Relative: 7 %
Neutro Abs: 6.7 10*3/uL (ref 1.7–7.7)
Neutrophils Relative %: 79 %
Platelets: 264 10*3/uL (ref 150–400)
RBC: 4.72 MIL/uL (ref 3.87–5.11)
RDW: 16.1 % — ABNORMAL HIGH (ref 11.5–15.5)
WBC: 8.5 10*3/uL (ref 4.0–10.5)
nRBC: 0 % (ref 0.0–0.2)

## 2021-08-13 LAB — I-STAT VENOUS BLOOD GAS, ED
Acid-base deficit: 2 mmol/L (ref 0.0–2.0)
Bicarbonate: 22.6 mmol/L (ref 20.0–28.0)
Calcium, Ion: 1.04 mmol/L — ABNORMAL LOW (ref 1.15–1.40)
HCT: 45 % (ref 36.0–46.0)
Hemoglobin: 15.3 g/dL — ABNORMAL HIGH (ref 12.0–15.0)
O2 Saturation: 65 %
Potassium: 4.8 mmol/L (ref 3.5–5.1)
Sodium: 142 mmol/L (ref 135–145)
TCO2: 24 mmol/L (ref 22–32)
pCO2, Ven: 38.5 mmHg — ABNORMAL LOW (ref 44–60)
pH, Ven: 7.376 (ref 7.25–7.43)
pO2, Ven: 35 mmHg (ref 32–45)

## 2021-08-13 LAB — COMPREHENSIVE METABOLIC PANEL
ALT: 25 U/L (ref 0–44)
AST: 37 U/L (ref 15–41)
Albumin: 3.5 g/dL (ref 3.5–5.0)
Alkaline Phosphatase: 80 U/L (ref 38–126)
Anion gap: 12 (ref 5–15)
BUN: 34 mg/dL — ABNORMAL HIGH (ref 8–23)
CO2: 17 mmol/L — ABNORMAL LOW (ref 22–32)
Calcium: 8.6 mg/dL — ABNORMAL LOW (ref 8.9–10.3)
Chloride: 111 mmol/L (ref 98–111)
Creatinine, Ser: 1.32 mg/dL — ABNORMAL HIGH (ref 0.44–1.00)
GFR, Estimated: 37 mL/min — ABNORMAL LOW (ref >60)
Glucose, Bld: 206 mg/dL — ABNORMAL HIGH (ref 70–99)
Potassium: 4.9 mmol/L (ref 3.5–5.1)
Sodium: 140 mmol/L (ref 135–145)
Total Bilirubin: 1 mg/dL (ref 0.3–1.2)
Total Protein: 6 g/dL — ABNORMAL LOW (ref 6.5–8.1)

## 2021-08-13 LAB — TSH: TSH: 5.842 u[IU]/mL — ABNORMAL HIGH (ref 0.350–4.500)

## 2021-08-13 LAB — LIPASE, BLOOD: Lipase: 31 U/L (ref 11–51)

## 2021-08-13 LAB — MAGNESIUM: Magnesium: 2.3 mg/dL (ref 1.7–2.4)

## 2021-08-13 MED ORDER — SODIUM CHLORIDE 0.9 % IV BOLUS
500.0000 mL | Freq: Once | INTRAVENOUS | Status: AC
  Administered 2021-08-13: 500 mL via INTRAVENOUS

## 2021-08-13 MED ORDER — ATORVASTATIN CALCIUM 10 MG PO TABS
5.0000 mg | ORAL_TABLET | Freq: Every day | ORAL | Status: DC
  Administered 2021-08-13 – 2021-08-16 (×4): 5 mg via ORAL
  Filled 2021-08-13 (×4): qty 1

## 2021-08-13 MED ORDER — ADENOSINE 6 MG/2ML IV SOLN
6.0000 mg | Freq: Once | INTRAVENOUS | Status: AC
  Administered 2021-08-13: 6 mg via INTRAVENOUS

## 2021-08-13 MED ORDER — ADENOSINE 6 MG/2ML IV SOLN
3.0000 mg | Freq: Once | INTRAVENOUS | Status: AC
  Administered 2021-08-13: 3 mg via INTRAVENOUS
  Filled 2021-08-13: qty 2

## 2021-08-13 MED ORDER — DILTIAZEM HCL 25 MG/5ML IV SOLN
5.0000 mg | Freq: Once | INTRAVENOUS | Status: AC
  Administered 2021-08-13: 5 mg via INTRAVENOUS
  Filled 2021-08-13: qty 5

## 2021-08-13 MED ORDER — PANTOPRAZOLE SODIUM 40 MG PO TBEC
40.0000 mg | DELAYED_RELEASE_TABLET | Freq: Every day | ORAL | Status: DC
Start: 2021-08-14 — End: 2021-08-16
  Administered 2021-08-14 – 2021-08-16 (×3): 40 mg via ORAL
  Filled 2021-08-13 (×3): qty 1

## 2021-08-13 MED ORDER — SODIUM BICARBONATE 650 MG PO TABS
650.0000 mg | ORAL_TABLET | Freq: Two times a day (BID) | ORAL | Status: DC
Start: 2021-08-13 — End: 2021-08-16
  Administered 2021-08-13 – 2021-08-16 (×7): 650 mg via ORAL
  Filled 2021-08-13 (×8): qty 1

## 2021-08-13 MED ORDER — GUAIFENESIN ER 600 MG PO TB12
1200.0000 mg | ORAL_TABLET | Freq: Two times a day (BID) | ORAL | Status: DC
  Administered 2021-08-13 – 2021-08-16 (×7): 1200 mg via ORAL
  Filled 2021-08-13 (×7): qty 2

## 2021-08-13 MED ORDER — DIGOXIN 0.25 MG/ML IJ SOLN
0.1250 mg | Freq: Once | INTRAMUSCULAR | Status: AC
  Administered 2021-08-13: 0.125 mg via INTRAVENOUS
  Filled 2021-08-13: qty 2

## 2021-08-13 MED ORDER — SODIUM CHLORIDE 0.9 % IV SOLN: 250.0000 mL | INTRAVENOUS | Status: DC | PRN

## 2021-08-13 MED ORDER — APIXABAN 2.5 MG PO TABS
2.5000 mg | ORAL_TABLET | Freq: Two times a day (BID) | ORAL | Status: DC
  Administered 2021-08-13 – 2021-08-16 (×6): 2.5 mg via ORAL
  Filled 2021-08-13 (×6): qty 1

## 2021-08-13 MED ORDER — IPRATROPIUM BROMIDE 0.02 % IN SOLN
0.5000 mg | Freq: Four times a day (QID) | RESPIRATORY_TRACT | Status: DC
  Administered 2021-08-13 (×2): 0.5 mg via RESPIRATORY_TRACT
  Filled 2021-08-13 (×2): qty 2.5

## 2021-08-13 MED ORDER — SODIUM CHLORIDE 0.9% FLUSH
3.0000 mL | INTRAVENOUS | Status: DC | PRN
  Administered 2021-08-13: 3 mL via INTRAVENOUS

## 2021-08-13 MED ORDER — SODIUM CHLORIDE 0.9% FLUSH
3.0000 mL | Freq: Two times a day (BID) | INTRAVENOUS | Status: DC
  Administered 2021-08-13 – 2021-08-15 (×5): 3 mL via INTRAVENOUS

## 2021-08-13 MED ORDER — SODIUM CHLORIDE 0.9 % IV BOLUS
500.0000 mL | Freq: Once | INTRAVENOUS | Status: DC
Start: 2021-08-13 — End: 2021-08-13

## 2021-08-13 MED ORDER — METOPROLOL TARTRATE 5 MG/5ML IV SOLN
5.0000 mg | Freq: Once | INTRAVENOUS | Status: AC
  Administered 2021-08-13: 5 mg via INTRAVENOUS
  Filled 2021-08-13: qty 5

## 2021-08-13 MED ORDER — ADENOSINE 6 MG/2ML IV SOLN: 6.0000 mg | Freq: Once | INTRAVENOUS | Status: DC

## 2021-08-13 MED ORDER — ACETAMINOPHEN 325 MG PO TABS: 650.0000 mg | ORAL_TABLET | ORAL | Status: DC | PRN

## 2021-08-13 MED ORDER — MELATONIN 3 MG PO TABS
3.0000 mg | ORAL_TABLET | Freq: Every day | ORAL | Status: DC
  Administered 2021-08-13 – 2021-08-15 (×3): 3 mg via ORAL
  Filled 2021-08-13 (×3): qty 1

## 2021-08-13 MED ORDER — ACETAMINOPHEN 325 MG PO TABS: 650.0000 mg | ORAL_TABLET | Freq: Four times a day (QID) | ORAL | Status: DC | PRN

## 2021-08-13 MED ORDER — MAGIC MOUTHWASH
5.0000 mL | Freq: Four times a day (QID) | ORAL | Status: DC
  Administered 2021-08-13 – 2021-08-15 (×10): 5 mL via ORAL
  Filled 2021-08-13 (×14): qty 5

## 2021-08-13 MED ORDER — METOPROLOL TARTRATE 5 MG/5ML IV SOLN
2.5000 mg | Freq: Four times a day (QID) | INTRAVENOUS | Status: DC | PRN
Start: 2021-08-13 — End: 2021-08-16
  Administered 2021-08-13 – 2021-08-15 (×2): 2.5 mg via INTRAVENOUS
  Filled 2021-08-13 (×2): qty 5

## 2021-08-13 MED ORDER — SODIUM CHLORIDE 0.9 % IV SOLN: Freq: Once | INTRAVENOUS | Status: AC

## 2021-08-13 NOTE — ED Triage Notes (Signed)
Pt BIB [facility] from [facility] due to tachycardia.  Pt's baseline heart rate is usually 110.  Pt does have hx of SVT.  [facility] physician concerned due to HR being 150 for the past week.  Metoprolol given PRN at nursing home.  22g in left hand.  317m of fluids given by [facility].  Aox1. Hx of dementia.  VS BP 120/73, Pulse 150, Resp 18, 94% SpO2, CBG 177. ? ?

## 2021-08-13 NOTE — ED Notes (Signed)
Per [facility] nephew is aware she is here at the [facility]. ?

## 2021-08-13 NOTE — ED Notes (Signed)
PT placed on 2L [place] for sob and sats of 88% and RR of 26.  Sats improved to 93%.  MD notified. ?

## 2021-08-13 NOTE — Progress Notes (Signed)
CT chest reviewed, no PNA. ?

## 2021-08-13 NOTE — ED Provider Notes (Signed)
? ?Emergency Department Provider Note ? ? ?I have reviewed the triage vital signs and the nursing notes. ? ? ?HISTORY ? ?Chief Complaint ?Tachycardia ? ? ?HPI ?Bianca [name] is a 88 y.o. female with PMH reviewed including paroxysmal SVT presents to the emergency department with elevated heart rate noted at her nursing facility.  Patient reports only some epigastric abdominal discomfort.  Denies any chest pain or pressure.  She does not feel like her heart is going fast but history is also limited by her underlying memory issues.  She reports taking "whatever they give me" in terms of her medications at [facility].  ? ?In chart review, patient is followed by [facility] cardiology and has had her paroxysmal SVT managed in the past by amiodarone which was discontinued due to prolonged QT interval.  She was recently started on Pindolol 5 mg BID with holding parameters for BP.  ? ? ?Past Medical History:  ?Diagnosis Date  ? Atrial flutter, paroxysmal ([place])   ? COVID-19   ? Dementia ([place])   ? Diastolic dysfunction   ? Hyperlipidemia   ? Hypertension   ? PSVT (paroxysmal supraventricular tachycardia) ([place])   ? Stroke [facility])   ? 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 ? ?Review of Systems ? ?Constitutional: No fever/chills ?Cardiovascular: Denies chest pain. ?Respiratory: Denies shortness of breath. ?Gastrointestinal: Positive epigastric abdominal pain.  No nausea, no vomiting.  No diarrhea.  No constipation. ?Musculoskeletal: Negative for back pain. ?Skin: Negative for rash. ?Neurological: Negative for headaches. ? ? ?____________________________________________ ? ? ?PHYSICAL EXAM: ? ?VITAL SIGNS: ?ED Triage Vitals  ?Enc Vitals Group  ?   BP 08/13/21 1007 123/90  ?   Pulse Rate 08/13/21 1007 (!) 150  ?   Resp 08/13/21 1007 18  ?   Temp 08/13/21 1007 (!) 97.5 ?F (36.4 ?C)  ?   Temp Source 08/13/21 1007 Oral  ?   SpO2 08/13/21 1006 95 %  ?   Weight 08/13/21  1008 112 lb 7 oz (51 kg)  ?   Height 08/13/21 1008 '4\' 5"'$  (1.346 m)  ? ?Constitutional: Alert and oriented. Well appearing and in no acute distress. ?Eyes: Conjunctivae are normal.  ?Head: Atraumatic. ?Nose: No congestion/rhinnorhea. ?Mouth/Throat: Mucous membranes are moist.  Oropharynx non-erythematous. ?Neck: No stridor.   ?Cardiovascular: tachycardia. Good peripheral circulation. Grossly normal heart sounds.   ?Respiratory: Normal respiratory effort.  No retractions. Lungs CTAB. ?Gastrointestinal: Soft and nontender. No distention.  ?Musculoskeletal: No lower extremity tenderness nor edema. No gross deformities of extremities. ?Neurologic:  Normal speech and language. No gross focal neurologic deficits are appreciated.  ?Skin:  Skin is warm, dry and intact. No rash noted. ? ?____________________________________________ ?  ?LABS ?(all labs ordered are listed, but only abnormal results are displayed) ? ?Labs Reviewed  ?COMPREHENSIVE METABOLIC PANEL - Abnormal; Notable for the following components:  ?    Result Value  ? CO2 17 (*)   ? Glucose, Bld 206 (*)   ? BUN 34 (*)   ? Creatinine, Ser 1.32 (*)   ? Calcium 8.6 (*)   ? Total Protein 6.0 (*)   ? GFR, Estimated 37 (*)   ? All other components within normal limits  ?CBC WITH DIFFERENTIAL/PLATELET - Abnormal; Notable for the following components:  ? HCT 46.1 (*)   ? RDW 16.1 (*)   ? All other components within normal limits  ?LIPASE, BLOOD  ?TROPONIN I (HIGH SENSITIVITY)  ?TROPONIN I (HIGH  SENSITIVITY)  ? ?____________________________________________ ? ?EKG ? ? EKG Interpretation ? ?Date/Time:  Sunday Aug 13 2021 10:01:44 EDT ?Ventricular Rate:  150 ?PR Interval:  203 ?QRS Duration: 95 ?QT Interval:  334 ?QTC Calculation: 528 ?R Axis:   155 ?Text Interpretation: Junctional tachycardia Right axis deviation Borderline low voltage, extremity leads Borderline T abnormalities, diffuse leads Confirmed by Nanda Quinton [phone]) on 08/13/2021 10:13:39 AM ?  ? ?   ? ? ?____________________________________________ ? ?RADIOLOGY ? ?DG Chest Port 1 View ? ?Result Date: 08/13/2021 ?CLINICAL DATA:  Tachycardia EXAM: PORTABLE CHEST 1 VIEW COMPARISON:  Chest x-ray dated November 02, 2019 FINDINGS: Cardiac and mediastinal contours are unchanged. New left basilar opacity. No large pleural effusion or pneumothorax. IMPRESSION: New mild left basilar opacity, differential considerations include atelectasis or infection/aspiration. Electronically Signed   By: Yetta Glassman M.D.   On: 08/13/2021 11:42   ? ?____________________________________________ ? ? ?PROCEDURES ? ?Procedure(s) performed:  ? ?.Cardioversion ? ?Date/Time: 08/15/2021 8:43 AM ?Performed by: Margette Fast, MD ?Authorized by: Margette Fast, MD  ? ?Consent:  ?  Consent obtained:  Verbal ?  Consent given by:  Patient and guardian (nephew) ?  Alternatives discussed:  Rate-control medication ?Pre-procedure details:  ?  Cardioversion basis:  Emergent ?  Rhythm:  Supraventricular tachycardia ?Attempt one:  ?  Cardioversion mode attempt one: 3 mg Adenosine. ?  Shock outcome:  No change in rhythm ?Attempt two:  ?  Cardioversion mode attempt two: 6 mg Adenosine. ?  Shock outcome:  Conversion to normal sinus rhythm (but then prompt return to SVT) ?Post-procedure details:  ?  Patient status:  Awake ?  Patient tolerance of procedure:  Tolerated well, no immediate complications ?Marland KitchenCritical Care ?Performed by: Margette Fast, MD ?Authorized by: Margette Fast, MD  ? ?Critical care provider statement:  ?  Critical care time (minutes):  35 ?  Critical care time was exclusive of:  Separately billable procedures and treating other patients and teaching time ?  Critical care was necessary to treat or prevent imminent or life-threatening deterioration of the following conditions:  Circulatory failure ?  Critical care was time spent personally by me on the following activities:  Development of treatment plan with patient or surrogate, discussions  with consultants, evaluation of patient's response to treatment, examination of patient, ordering and review of laboratory studies, ordering and review of radiographic studies, ordering and performing treatments and interventions, pulse oximetry, re-evaluation of patient's condition and review of old charts ?  I assumed direction of critical care for this patient from another provider in my specialty: no   ?  Care discussed with: admitting provider   ? ? ?____________________________________________ ? ? ?INITIAL IMPRESSION / ASSESSMENT AND PLAN / ED COURSE ? ?Pertinent labs & imaging results that were available during my care of the patient were reviewed by me and considered in my medical decision making (see chart for details). ?  ?This patient is Presenting for Evaluation of epigastric pain and elevated HR, which does require a range of treatment options, and is a complaint that involves a high risk of morbidity and mortality. ? ?The Differential Diagnoses  includes but is not exclusive to acute cholecystitis, intrathoracic causes for epigastric abdominal pain, gastritis, duodenitis, pancreatitis, small bowel or large bowel obstruction, abdominal aortic aneurysm, hernia, gastritis, etc. ? ? ?Critical Interventions-  ?  ?Medications  ?diltiazem (CARDIZEM) injection 5 mg (has no administration in time range)  ?sodium chloride 0.9 % bolus 500 mL (0 mLs Intravenous Stopped 08/13/21  1315)  ?metoprolol tartrate (LOPRESSOR) injection 5 mg (5 mg Intravenous Given 08/13/21 1043)  ?metoprolol tartrate (LOPRESSOR) injection 5 mg (5 mg Intravenous Given 08/13/21 1141)  ?adenosine (ADENOCARD) 6 MG/2ML injection 3 mg (3 mg Intravenous Given 08/13/21 1257)  ?adenosine (ADENOCARD) 6 MG/2ML injection 6 mg (6 mg Intravenous Given 08/13/21 1259)  ?0.9 %  sodium chloride infusion ( Intravenous New Bag/Given 08/13/21 1305)  ? ? ?Reassessment after intervention: No change in HR.  ? ? ?I did obtain Additional Historical Information from nephew at  bedside. ? ?I decided to review pertinent External Data, and in summary patient is followed by [facility]. ?  ?Clinical Laboratory Tests Ordered, included no leukocytosis or anemia.  Creatinine of 1.32.  Troponin

## 2021-08-13 NOTE — Progress Notes (Signed)
?   08/13/21 1919  ?Assess: MEWS Score  ?BP 111/85  ?Pulse Rate (!) 152  ?Resp 20  ?SpO2 96 %  ?O2 Device Nasal Cannula  ?O2 Flow Rate (L/min) 2.5 L/min  ?Assess: MEWS Score  ?MEWS Temp 0  ?MEWS Systolic 0  ?MEWS Pulse 3  ?MEWS RR 0  ?MEWS LOC 0  ?MEWS Score 3  ?MEWS Score Color Yellow  ?Assess: if the MEWS score is Yellow or Red  ?Were vital signs taken at a resting state? Yes  ?Focused Assessment No change from prior assessment  ?Early Detection of Sepsis Score *See Row Information* Low  ?MEWS guidelines implemented *See Row Information* No, previously yellow, continue vital signs every 4 hours  ?Document  ?Patient Outcome Stabilized after interventions  ?Progress note created (see row info) Yes  ? ? ?

## 2021-08-13 NOTE — H&P (Signed)
?History and Physical  ? ? ?Bianca [name] YFV:[id number] DOB: 01/25/1923 DOA: 08/13/2021 ? ?PCP: Virgie Dad, MD (Confirm with patient/family/NH records and if not entered, this has to be entered at [facility] point of entry) ?Patient coming from: Home ? ?I have personally briefly reviewed patient's old medical records in [place] ? ?Chief Complaint: Palpitations, shortness of breath ? ?HPI: Bianca [name] is a 88 y.o. female with medical history significant of PAF on Eliquis, PSVT refractory, chronic diastolic CHF, stroke, macular degeneration, came with worsening of palpitations and shortness of breath. ? ?Patient has chronic PAF versus PSVT, in the past, has tried multiple rate control medications, failed amiodarone because of prolonged QTc, who has had uncontrolled palpitations lately since late April.  She has been following with cardiologist who started patient on Pindolol in addition to Metoprolol. Last several days, patient gets.  Increasing palpitations and new onset of shortness of breath, today, even at rest, patient feels significant shortness of breath.  No cough no wheezing no chest pains. ? ?ED Course: Patient was found to be in SVT, was given adenosine x2 however patient developed significant shortness of breath with albuterol injections.  Then patient was given Cardizem x1, heart rate briefly controlled and soon resuming SVT. Cardiology Dr. Einar Gip contacted and will come in to help rate control. ? ?K=4.9, bicarb 17, creatinine 1.3, glucose 206. ? ?Check sed rate left lower lobe infiltrates versus atelectasis. ? ?Review of Systems: As per HPI otherwise 14 point review of systems negative.  ? ? ?Past Medical History:  ?Diagnosis Date  ? Atrial flutter, paroxysmal ([place])   ? COVID-19   ? Dementia ([place])   ? Diastolic dysfunction   ? Hyperlipidemia   ? Hypertension   ? PSVT (paroxysmal supraventricular tachycardia) ([place])   ? Stroke [facility])   ? Wet age-related macular degeneration of both eyes with  active choroidal neovascularization ([place])   ? ? ?History reviewed. No pertinent surgical history. ? ? reports that she has never smoked. She has never used smokeless tobacco. She reports that she does not drink alcohol and does not use drugs. ? ?Allergies  ?Allergen Reactions  ? Alphagan P [Brimonidine Tartrate]   ?  Unknown reaction ?Listed on MAR  ? Azopt [Brinzolamide] Other (See Comments)  ?  Unknown reaction ?Listed on MAR  ? Lumigan [Bimatoprost] Other (See Comments)  ?  Unknown reaction ?Listed on MAR  ? ? ?No family history on file. ? ?Prior to Admission medications   ?Medication Sig Start Date End Date Taking? Authorizing Provider  ?acetaminophen (TYLENOL) 325 MG tablet Take 650 mg by mouth every 6 (six) hours as needed.   Yes [provider]  ?apixaban (ELIQUIS) 2.5 MG TABS tablet Take 1 tablet (2.5 mg total) by mouth 2 (two) times daily. 11/10/19  Yes Hosie Poisson, MD  ?atorvastatin (LIPITOR) 10 MG tablet Take 5 mg by mouth daily. 1/2 a tablet=5 mg   Yes [provider]  ?B Complex-Folic Acid (B COMPLEX VITAMINS, W/ FA, PO) Take 1 tablet by mouth daily. Vitamin B complex - Folic Acid 0.4 mg   Yes [provider]  ?bismuth subsalicylate (PEPTO BISMOL) 262 MG/15ML suspension Take 60 mLs by mouth daily as needed for indigestion or diarrhea or loose stools.    Yes [provider]  ?Calcium Carbonate-Vitamin D 600-400 MG-UNIT tablet Take 1 tablet by mouth daily.   Yes [provider]  ?magic mouthwash SOLN Take 5 mLs by mouth 4 (four) times daily.  Swish and spit.   Yes [provider]  ?melatonin 3 MG TABS tablet Take 3 mg by mouth daily at 8 pm.   Yes [provider]  ?metoprolol tartrate (LOPRESSOR) 25 MG tablet Take 25 mg by mouth See admin instructions. 12.5 mg BID + an additional 12.5 mg daily as needed for heart rate > 110 and SBP > 100   Yes [provider]  ?Multiple Vitamins-Minerals (SENTRY SENIOR) TABS Take 1 tablet by mouth  daily.   Yes [provider]  ?Nutritional Supplements (FEEDING SUPPLEMENT, BOOST BREEZE,) LIQD Take 1 Bottle by mouth daily.   Yes [provider]  ?omeprazole (PRILOSEC) 20 MG capsule Take 20 mg by mouth daily.    Yes [provider]  ?Sodium Fluoride (PREVIDENT 5000 BOOSTER PLUS) 1.1 % PSTE Place 1 application. onto teeth daily at 8 pm.   Yes [provider]  ?timolol (TIMOPTIC) 0.5 % ophthalmic solution Place 1 drop into both eyes daily. 08/01/20  Yes [provider]  ?triamcinolone ointment (KENALOG) 0.1 % Apply 1 application. topically 2 (two) times daily.   Yes [provider]  ?pindolol (VISKEN) 5 MG tablet Take 1 tablet (5 mg total) by mouth 2 (two) times daily. Hold if systolic blood pressure (top number) less than 100 mmHg or pulse less than 60 bpm. ?Patient not taking: Reported on 08/13/2021 08/02/21 10/31/21  Rex Kras, DO  ? ? ?Physical Exam: ?Vitals:  ? 08/13/21 1100 08/13/21 1115 08/13/21 1230 08/13/21 1330  ?BP: (!) 111/97 (!) 122/98 117/90 (!) 119/96  ?Pulse: (!) 150 (!) 150 (!) 151 (!) 154  ?Resp: (!) 21 (!) 26 (!) 24 (!) 28  ?Temp:      ?TempSrc:      ?SpO2: 95% 95% 97% 99%  ?Weight:      ?Height:      ? ? ?Constitutional: NAD, calm, comfortable ?Vitals:  ? 08/13/21 1100 08/13/21 1115 08/13/21 1230 08/13/21 1330  ?BP: (!) 111/97 (!) 122/98 117/90 (!) 119/96  ?Pulse: (!) 150 (!) 150 (!) 151 (!) 154  ?Resp: (!) 21 (!) 26 (!) 24 (!) 28  ?Temp:      ?TempSrc:      ?SpO2: 95% 95% 97% 99%  ?Weight:      ?Height:      ? ?Eyes: PERRL, lids and conjunctivae normal ?ENMT: Mucous membranes are moist. Posterior pharynx clear of any exudate or lesions.Normal dentition.  ?Neck: normal, supple, no masses, no thyromegaly ?Respiratory: clear to auscultation bilaterally, no wheezing, fine crackles on bilateral lower fields, increasing breathing effort, talking in broken sentences No accessory muscle use.  ?Cardiovascular: Regular rate and rhythm, no murmurs /  rubs / gallops. No extremity edema. 2+ pedal pulses. No carotid bruits.  ?Abdomen: no tenderness, no masses palpated. No hepatosplenomegaly. Bowel sounds positive.  ?Musculoskeletal: no clubbing / cyanosis. No joint deformity upper and lower extremities. Good ROM, no contractures. Normal muscle tone.  ?Skin: no rashes, lesions, ulcers. No induration ?Neurologic: CN 2-12 grossly intact. Sensation intact, DTR normal. Strength 5/5 in all 4.  ?Psychiatric: Normal judgment and insight. Alert and oriented x 3. Normal mood.  ? ? ? ?Labs on Admission: I have personally reviewed following labs and imaging studies ? ?CBC: ?Recent Labs  ?Lab 08/13/21 ?1027  ?WBC 8.5  ?NEUTROABS 6.7  ?HGB 14.3  ?HCT 46.1*  ?MCV 97.7  ?PLT 264  ? ?Basic Metabolic Panel: ?Recent Labs  ?Lab 08/13/21 ?1027  ?NA 140  ?K 4.9  ?CL 111  ?CO2  17*  ?GLUCOSE 206*  ?BUN 34*  ?CREATININE 1.32*  ?CALCIUM 8.6*  ? ?GFR: ?Estimated Creatinine Clearance: 14.6 mL/min (A) (by C-G formula based on SCr of 1.32 mg/dL (H)). ?Liver Function Tests: ?Recent Labs  ?Lab 08/13/21 ?1027  ?AST 37  ?ALT 25  ?ALKPHOS 80  ?BILITOT 1.0  ?PROT 6.0*  ?ALBUMIN 3.5  ? ?Recent Labs  ?Lab 08/13/21 ?1027  ?LIPASE 31  ? ?No results for input(s): AMMONIA in the last 168 hours. ?Coagulation Profile: ?No results for input(s): INR, PROTIME in the last 168 hours. ?Cardiac Enzymes: ?No results for input(s): CKTOTAL, CKMB, CKMBINDEX, TROPONINI in the last 168 hours. ?BNP (last 3 results) ?No results for input(s): PROBNP in the last 8760 hours. ?HbA1C: ?No results for input(s): HGBA1C in the last 72 hours. ?CBG: ?No results for input(s): GLUCAP in the last 168 hours. ?Lipid Profile: ?No results for input(s): CHOL, HDL, LDLCALC, TRIG, CHOLHDL, LDLDIRECT in the last 72 hours. ?Thyroid Function Tests: ?No results for input(s): TSH, T4TOTAL, FREET4, T3FREE, THYROIDAB in the last 72 hours. ?Anemia Panel: ?No results for input(s): VITAMINB12, FOLATE, FERRITIN, TIBC, IRON, RETICCTPCT in the last 72  hours. ?Urine analysis: ?   ?Component Value Date/Time  ? COLORURINE YELLOW 01/10/2020 0624  ? APPEARANCEUR CLEAR 01/10/2020 0624  ? LABSPEC 1.016 01/10/2020 0624  ? PHURINE 5.0 01/10/2020 0624  ? GLUCOSEU NEGATIVE

## 2021-08-14 DIAGNOSIS — I471 Supraventricular tachycardia: Secondary | ICD-10-CM | POA: Diagnosis not present

## 2021-08-14 LAB — BASIC METABOLIC PANEL
Anion gap: 7 (ref 5–15)
BUN: 34 mg/dL — ABNORMAL HIGH (ref 8–23)
CO2: 20 mmol/L — ABNORMAL LOW (ref 22–32)
Calcium: 8.3 mg/dL — ABNORMAL LOW (ref 8.9–10.3)
Chloride: 114 mmol/L — ABNORMAL HIGH (ref 98–111)
Creatinine, Ser: 1.22 mg/dL — ABNORMAL HIGH (ref 0.44–1.00)
GFR, Estimated: 40 mL/min — ABNORMAL LOW (ref >60)
Glucose, Bld: 97 mg/dL (ref 70–99)
Potassium: 4.5 mmol/L (ref 3.5–5.1)
Sodium: 141 mmol/L (ref 135–145)

## 2021-08-14 LAB — DIGOXIN LEVEL: Digoxin Level: 1.9 ng/mL (ref 0.8–2.0)

## 2021-08-14 MED ORDER — DIGOXIN 125 MCG PO TABS: 0.1250 mg | ORAL_TABLET | Freq: Every day | ORAL | Status: DC

## 2021-08-14 MED ORDER — METOPROLOL TARTRATE 5 MG/5ML IV SOLN
2.5000 mg | Freq: Four times a day (QID) | INTRAVENOUS | Status: DC
  Administered 2021-08-14: 2.5 mg via INTRAVENOUS
  Filled 2021-08-14: qty 5

## 2021-08-14 MED ORDER — TIMOLOL MALEATE 0.5 % OP SOLN
1.0000 [drp] | Freq: Every day | OPHTHALMIC | Status: DC
  Administered 2021-08-14 – 2021-08-15 (×2): 1 [drp] via OPHTHALMIC
  Filled 2021-08-14: qty 5

## 2021-08-14 MED ORDER — DIGOXIN 0.25 MG/ML IJ SOLN
0.5000 mg | Freq: Once | INTRAMUSCULAR | Status: AC
  Administered 2021-08-14: 0.5 mg via INTRAVENOUS
  Filled 2021-08-14: qty 2

## 2021-08-14 MED ORDER — METOPROLOL TARTRATE 5 MG/5ML IV SOLN
2.5000 mg | Freq: Once | INTRAVENOUS | Status: AC
  Administered 2021-08-14: 2.5 mg via INTRAVENOUS

## 2021-08-14 MED ORDER — DIGOXIN 0.25 MG/ML IJ SOLN: 0.1250 mg | Freq: Once | INTRAMUSCULAR | Status: DC

## 2021-08-14 MED ORDER — LEVALBUTEROL TARTRATE 45 MCG/ACT IN AERO
2.0000 | INHALATION_SPRAY | Freq: Four times a day (QID) | RESPIRATORY_TRACT | Status: DC | PRN
  Filled 2021-08-14: qty 15

## 2021-08-14 MED ORDER — IPRATROPIUM BROMIDE 0.02 % IN SOLN
0.5000 mg | Freq: Two times a day (BID) | RESPIRATORY_TRACT | Status: DC
  Filled 2021-08-14: qty 2.5

## 2021-08-14 NOTE — Progress Notes (Signed)
?PROGRESS NOTE ? ?Bianca [name]  ?DOB: June 23, 1922  ?PCP: Virgie Dad, MD ?VQQ:[id number]  ?DOA: 08/13/2021 ? LOS: 1 day  ?Hospital Day: 2 ? ?Brief narrative: ?Bianca [name] is a 88 y.o. female with PMH significant for HTN, HLD, paroxysmal a flutter, PSVT, diastolic dysfunction, stroke ?Patient presented to the ED from an assisted living facility on 5/7 with complaint of worsening shortness of breath and palpitation. ? ?Patient has chronic PAF versus PSVT, in the past, has tried multiple rate control medications, failed amiodarone because of prolonged QTc, who has had uncontrolled palpitations lately since late April.  She has been following with cardiologist who started patient on Pindolol in addition to Metoprolol. Last several days, patient has been experiencing palpitations and progressively worsening shortness of breath.  No chest pain or cough or wheezing. ? ?In the ED, patient was noted to be tachycardic to 150s, EKG showed SVT.  She was given adenosine x2, Cardizem x1 after which heart rate got controlled briefly but started having SVT again. ?Cardiologist Dr. Einar Gip was consulted. ?Admitted to hospitalist service ? ?Subjective: ?Patient was seen and examined this morning.  Pleasant elderly Caucasian female.  Sitting up in chair.  Not in distress.  She did not mention palpitation to me but heart rate was running over 130 at the time of my evaluation ?Events from last night noted, patient had episode of RVR.  Patient was given IV beta-blocker as reduction. ?Chart reviewed.  Afebrile.  ? ?Principal Problem: ?  SVT (supraventricular tachycardia) ([place]) ?Active Problems: ?  CKD (chronic kidney disease) stage 3, GFR 30-59 ml/min ([facility]) ?  Tachycardia ?  Diastolic dysfunction ?  ? ?Assessment and Plan: ?pSVT vs a flutter ?-Failed outpatient management, poor response to beta-blocker, and blood pressure too low for continuous Cardizem infusion.   ?-In the ED, she was given adenosine x2, digoxin 0.125 mg x 1,  Cardizem bolus ?-Currently patient is on digoxin 125 mcg daily, metoprolol 2.5 mg IV every 6 as needed ?-Heart rate remains over 130 in the monitor still ?-Cardiology consult appreciated.  Noted a plan to consult EP. ?-Continue Eliquis for anticoagulation. ?  ?Chronic diastolic CHF decompensation ?-Shortness of breath mostly due to arrhythmia.  Does not look volume overloaded.  Chest x-ray without evidence of congestion.  ?-Currently not on diuresis. ? ?Hyperlipidemia ?-Continue statin ?  ?CKD stage IV ?Chronic metabolic acidosis ?-Creatinine at baseline.  Sodium bicarbonate started for chronic metabolic acidosis ?Recent Labs  ?  01/19/21 ?0000 06/08/21 ?0000 08/13/21 ?1027 08/14/21 ?0336  ?BUN 36* 23* 34* 34*  ?CREATININE 1.2* 0.9 1.32* 1.22*  ?CO2 18 25* 17* 20*  ?  ?Goals of care ?  Code Status: DNR  ? ? ?Mobility: Encourage ambulation.  PT recommended home with PT ? ?Skin assessment:  ?  ? ?Nutritional status:  ?Body mass index is 24.08 kg/m?.  ?  ?  ? ? ? ? ?Diet:  ?Diet Order   ? ?       ?  Diet Heart Room service appropriate? Yes; Fluid consistency: Thin; Fluid restriction: 1800 mL Fluid  Diet effective now       ?  ? ?  ?  ? ?  ? ? ?DVT prophylaxis:  ?apixaban (ELIQUIS) tablet 2.5 mg Start: 08/13/21 2200 ?apixaban (ELIQUIS) tablet 2.5 mg   ?Antimicrobials: None ?Fluid: None ?Consultants: Cardiology ?Family Communication: None at bedside ? ?Status is: Inpatient ? ?Continue in-hospital care because: May need EP ?Level of care: Progressive  ? ?Dispo: The patient  is from: ALF ?             Anticipated d/c is to: Hopefully back to ALF ?             Patient currently is not medically stable to d/c. ?  Difficult to place patient No ? ? ? ? ?Infusions:  ? sodium chloride    ? ? ?Scheduled Meds: ? apixaban  2.5 mg Oral BID  ? atorvastatin  5 mg Oral Daily  ? [START ON 08/15/2021] digoxin  0.125 mg Oral Daily  ? guaiFENesin  1,200 mg Oral BID  ? magic mouthwash  5 mL Oral QID  ? melatonin  3 mg Oral Q2000  ?  pantoprazole  40 mg Oral Daily  ? sodium bicarbonate  650 mg Oral BID  ? sodium chloride flush  3 mL Intravenous Q12H  ? ? ?PRN meds: ?sodium chloride, acetaminophen, acetaminophen, levalbuterol, metoprolol tartrate, sodium chloride flush  ? ?Antimicrobials: ?Anti-infectives (From admission, onward)  ? ? None  ? ?  ? ? ?Objective: ?Vitals:  ? 08/14/21 0716 08/14/21 1114  ?BP: (!) 129/51 (!) 112/94  ?Pulse: (!) 53 67  ?Resp: 16 18  ?Temp: 97.9 ?F (36.6 ?C) 97.9 ?F (36.6 ?C)  ?SpO2: 93% 94%  ? ? ?Intake/Output Summary (Last 24 hours) at 08/14/2021 1340 ?Last data filed at 08/14/2021 1123 ?Gross per 24 hour  ?Intake 420 ml  ?Output 600 ml  ?Net -180 ml  ? ?Filed Weights  ? 08/13/21 1008 08/13/21 1554 08/14/21 0325  ?Weight: 51 kg 52.2 kg 52.3 kg  ? ?Weight change:  ?Body mass index is 24.08 kg/m?.  ? ?Physical Exam: ?General exam: Pleasant, elderly Caucasian female.  Sitting up in chair.  Not in distress ?Skin: No rashes, lesions or ulcers. ?HEENT: Atraumatic, normocephalic, no obvious bleeding ?Lungs: Clear to auscultate bilaterally ?CVS: Tachycardic, irregular rhythm ?GI/Abd soft, nontender, nondistended, bowel sound present ?CNS: Alert, awake, hard of hearing, slow to respond ?Psychiatry: Mood appropriate ?Extremities: No pedal edema, no calf tenderness ? ?Data Review: I have personally reviewed the laboratory data and studies available. ? ?F/u labs ordered ?Unresulted Labs (From admission, onward)  ? ?  Start     Ordered  ? 08/14/21 9476  Basic metabolic panel  Daily,   R     ?Comments: As Scheduled for 5 days ?  ? 08/13/21 1401  ? 08/13/21 1403  Urinalysis, Routine w reflex microscopic  Once,   R       ? 08/13/21 1402  ? ?  ?  ? ?  ? ? ?Signed, ?Terrilee Croak, MD ?[facility] ?08/14/2021 ? ? ? ? ? ? ? ? ? ? ? ? ?

## 2021-08-14 NOTE — Progress Notes (Signed)
HOSPITAL MEDICINE OVERNIGHT EVENT NOTE   ? ?Notified by nursing that patient has continued to exhibit rapid atrial fibrillation since arrival to the medical floor.  Heart rates are range between 140 and 150 bpm. ? ?Patient denies chest pain.  Patient is currently hemodynamically stable although blood pressures are ranging between 950 and 932 systolic. ? ?Chart reviewed including outpatient cardiology notes and admitting provider note.  Patient has failed amiodarone in the past due to prolonged QT.  Patient was initially given multiple medications in the emergency department without improvement in heart rate including dosing of diltiazem and metoprolol.  Patient was given 0.'125mg'$  of Digoxin by the admitting provider.   ? ?Will give an additional 2.5 mg of intravenous metoprolol and monitor for response.   ? ?Vernelle Emerald  MD ?[facility]  ? ?ADDENDUM (5/8 1AM) ? ?Continuing to exhibit proper atrial fibrillation with heart rates approaching 150 bpm despite administration of metoprolol. ? ?At this point, I feel that continued digoxin loading is probably best for this patient.  I note that the admitting provider has given 125 mcg of digoxin.  We will go ahead and give an additional 500 mcg of digoxin.   We will then obtain a digoxin level midday tomorrow followed by maintenance regimen of 140mg of digoxin daily.  We will monitor for response to digoxin load. ? ?GSherryll BurgerShalhoub ? ?ADDENUM (5/8 5AM) ? ?Patient has exhibited an excellent response to digoxin load.  Heart rate is now ranging between the 50s and 60s in sinus rhythm.  Patient is now resting comfortably without any complaints.  Blood pressures are stable in the 1671systolic range. ? ?We will avoid loading with any additional intravenous digoxin and transition to 0.125 mg of oral digoxin daily.  We will obtain digoxin level later today.  Admitter note states that cardiology was consulted, expect for them to evaluate patient later today. ? ?GSherryll BurgerShalhoub ? ? ? ? ? ? ? ? ? ? ? ?

## 2021-08-14 NOTE — Plan of Care (Signed)
?  Problem: Health Behavior/Discharge Planning: ?Goal: Ability to manage health-related needs will improve ?Outcome: Progressing ?  ?Problem: Clinical Measurements: ?Goal: Respiratory complications will improve ?Outcome: Progressing ?Goal: Cardiovascular complication will be avoided ?Outcome: Progressing ?  ?

## 2021-08-14 NOTE — TOC Progression Note (Addendum)
Transition of Care (TOC) - Progression Note  ? ? ?Patient Details  ?Name: Bianca [name] ?MRN: [id number] ?Date of Birth: 11-Jun-1922 ? ?Transition of Care (TOC) CM/SW Contact  ?Dayzee Trower Renold Don, LCSWA ?Phone Number: ?08/14/2021, 3:11 PM ? ?Clinical Narrative:    ?CSW spoke with Karlene Einstein at [facility] about pt DC plan. Karlene Einstein can be reached at [phone] ext. [phone] for any DC updates. ? ? ?  ?  ? ?Expected Discharge Plan and Services ?  ?  ?  ?  ?  ?                ?  ?  ?  ?  ?  ?  ?  ?  ?  ?  ? ? ?Social Determinants of Health (SDOH) Interventions ?  ? ?Readmission Risk Interventions ? ?  01/11/2020  ? 10:45 AM  ?Readmission Risk Prevention Plan  ?Transportation Screening Complete  ?PCP or Specialist Appt within 5-7 Days Complete  ?Home Care Screening Complete  ?Medication Review (RN CM) Referral to Pharmacy  ? ? ?

## 2021-08-14 NOTE — Evaluation (Signed)
Physical Therapy Evaluation ?Patient Details ?Name: Bianca [name] ?MRN: [id number] ?DOB: [birth year] ?Today's Date: 08/14/2021 ? ?History of Present Illness ? The pt is a 88 yo female presenting 5/7 with tachycardia (HR in 150s for last week). Pt with hx of SVT and normal resting HR is 110bpm. PMH includes: afib, CHF, stroke, macular degeneration, dementia, and SVT. ?  ?Clinical Impression ? Pt in bed upon arrival of PT, agreeable to evaluation at this time. Prior to admission the pt was receiving assist for all mobility and ADLs from staff at ALF, and using rollator for ambulation. The pt also reports that she "falls often" but was unable to answer any other questions about the fall frequency or cause at this time. The pt was able to complete ~75 ft ambulation with RW prior to needing standing rest break and then additional 35 ft to return to the room. Her HR initially remained 60-80bpm with activity, but with progressive exertion, increased to max 150bpm, and then recovered to 66bpm with seated rest. SpO2 100% on RA initially, to low of 88% on RA after gait, pt placed back on 1L O2 with SpO2 improvement to 96%. Given level of assist available at ALF, recommend return to [facility] when medically stable with follow up HHPT at the facility to improve stability and endurance.  ?   ?   ? ?Recommendations for follow up therapy are one component of a multi-disciplinary discharge planning process, led by the attending physician.  Recommendations may be updated based on patient status, additional functional criteria and insurance authorization. ? ?Follow Up Recommendations Home health PT (at assisted living) ? ?  ?Assistance Recommended at Discharge Frequent or constant Supervision/Assistance  ?Patient can return home with the following ? A little help with walking and/or transfers;A little help with bathing/dressing/bathroom;Direct supervision/assist for medications management;Direct supervision/assist for financial  management;Assist for transportation;Help with stairs or ramp for entrance ? ?  ?Equipment Recommendations None recommended by PT  ?Recommendations for Other Services ?    ?  ?Functional Status Assessment Patient has had a recent decline in their functional status and demonstrates the ability to make significant improvements in function in a reasonable and predictable amount of time.  ? ?  ?Precautions / Restrictions Precautions ?Precautions: Fall ?Precaution Comments: pt reports multiple falls ?Restrictions ?Weight Bearing Restrictions: No  ? ?  ? ?Mobility ? Bed Mobility ?Overal bed mobility: Needs Assistance ?Bed Mobility: Supine to Sit ?  ?  ?Supine to sit: Min assist ?  ?  ?General bed mobility comments: minA to move LE to EOB and to elevate trunk. increased time and cues for sequencing ?  ? ?Transfers ?Overall transfer level: Needs assistance ?Equipment used: Rolling walker (2 wheels) ?Transfers: Sit to/from Stand ?Sit to Stand: Min guard ?  ?  ?  ?  ?  ?General transfer comment: cues for hand placement, pt slow to rise ?  ? ?Ambulation/Gait ?Ambulation/Gait assistance: Min guard ?Gait Distance (Feet): 75 Feet (+ 35 ft) ?Assistive device: Rolling walker (2 wheels) ?Gait Pattern/deviations: Step-through pattern, Decreased stride length ?Gait velocity: < 0.21 m/s ?Gait velocity interpretation: <1.31 ft/sec, indicative of household ambulator ?  ?General Gait Details: pt with small but generally steady gait without overt LOB. HR mostly steady in 80s until pt fatigued, then reaching high of 150bpm. The pt did ask for single standing rest break after ~75 ft ? ? ?  ? ?Balance Overall balance assessment: Needs assistance, History of Falls ?Sitting-balance support: No upper extremity supported, Feet  supported ?Sitting balance-Leahy Scale: Fair ?  ?  ?Standing balance support: Bilateral upper extremity supported, During functional activity ?Standing balance-Leahy Scale: Poor ?Standing balance comment: BUE support on  RW ?  ?  ?  ?  ?  ?  ?  ?  ?  ?  ?  ?   ? ? ? ?Pertinent Vitals/Pain Pain Assessment ?Pain Assessment: No/denies pain  ? ? ?Home Living Family/patient expects to be discharged to:: Assisted living ?  ?Available Help at Discharge: [place];Available 24 hours/day ?Type of Home: Assisted living ?  ?  ?  ?  ?  ?  ?   ?  ?Prior Function Prior Level of Function : Needs assist ?  ?  ?  ?Physical Assist : Mobility (physical);ADLs (physical) ?Mobility (physical): Bed mobility;Transfers;Gait ?ADLs (physical): Grooming;Bathing;Dressing;Toileting ?Mobility Comments: pt mobilizing with use of rollator, reports people are there to assist with almost all activities and that she doesnt move much because she falls often ?ADLs Comments: pt reports taking showers in a shower but staff assist with all ADLs ?  ? ? ?   ?Extremity/Trunk Assessment  ? Upper Extremity Assessment ?Upper Extremity Assessment: Generalized weakness ?  ? ?Lower Extremity Assessment ?Lower Extremity Assessment: Generalized weakness ?  ? ?Cervical / Trunk Assessment ?Cervical / Trunk Assessment: Kyphotic  ?Communication  ? Communication: No difficulties  ?Cognition Arousal/Alertness: Awake/alert ?Behavior During Therapy: Flat affect ?Overall Cognitive Status: History of cognitive impairments - at baseline ?  ?  ?  ?  ?  ?  ?  ?  ?  ?  ?  ?  ?  ?  ?  ?  ?General Comments: pt with hx of dementia,able to follow all simple commands and answer questions regarding assist at assisted living ?  ?  ? ?  ?General Comments General comments (skin integrity, edema, etc.): HR 60-86bpm with gait initially, with fatigue HR increased to 150bpm but returned quickly to 60s with seated rest. SpO2 to low of 88% on RA after gait, pt placed on 1L O2 to recover with SpO2 96% ? ?  ?   ? ?Assessment/Plan  ?  ?PT Assessment Patient needs continued PT services  ?PT Problem List Decreased strength;Decreased activity tolerance;Decreased balance;Decreased mobility;Decreased  cognition ? ?   ?  ?PT Treatment Interventions DME instruction;Gait training;Stair training;Functional mobility training;Therapeutic activities;Therapeutic exercise;Balance training;Patient/family education   ? ?PT Goals (Current goals can be found in the Care Plan section)  ?Acute Rehab PT Goals ?Patient Stated Goal: [facility] ?PT Goal Formulation: With patient ?Time For Goal Achievement: 08/28/21 ?Potential to Achieve Goals: Good ? ?  ?Frequency Min 2X/week ?  ? ? ?   ?AM-PAC PT "6 Clicks" Mobility  ?Outcome Measure Help needed turning from your back to your side while in a flat bed without using bedrails?: A Little ?Help needed moving from lying on your back to sitting on the side of a flat bed without using bedrails?: A Little ?Help needed moving to and from a bed to a chair (including a wheelchair)?: A Little ?Help needed standing up from a chair using your arms (e.g., wheelchair or bedside chair)?: A Little ?Help needed to walk in hospital room?: A Little ?Help needed climbing 3-5 steps with a railing? : A Lot ?6 Click Score: 17 ? ?  ?End of Session Equipment Utilized During Treatment: Gait belt;Oxygen ?Activity Tolerance: Patient tolerated treatment well ?Patient left: in chair;with call bell/phone within reach;with chair alarm set ?Nurse Communication: Mobility  status ?PT Visit Diagnosis: Other abnormalities of gait and mobility (R26.89);History of falling (Z91.81) ?  ? ?Time: 5258-9483 ?PT Time Calculation (min) (ACUTE ONLY): 26 min ? ? ?Charges:   PT Evaluation ?$PT Eval Low Complexity: 1 Low ?PT Treatments ?$Therapeutic Exercise: 8-22 mins ?  ?   ? ? ?[place] Carbo, PT, DPT  ? ?Acute Rehabilitation Department ?Pager #: [phone] ? ?Sandra Cockayne ?08/14/2021, 11:23 AM ? ?

## 2021-08-14 NOTE — Progress Notes (Signed)
?   08/14/21 0141  ?Assess: MEWS Score  ?Temp 97.8 ?F (36.6 ?C)  ?BP (!) 130/98  ?Pulse Rate (!) 142  ?Assess: MEWS Score  ?MEWS Temp 0  ?MEWS Systolic 0  ?MEWS Pulse 3  ?MEWS RR 1  ?MEWS LOC 0  ?MEWS Score 4  ?MEWS Score Color Red  ?Assess: if the MEWS score is Yellow or Red  ?Were vital signs taken at a resting state? Yes  ?Focused Assessment No change from prior assessment  ?Early Detection of Sepsis Score *See Row Information* Low  ?MEWS guidelines implemented *See Row Information* No, previously red, continue vital signs every 4 hours  ?Document  ?Patient Outcome Stabilized after interventions  ?Progress note created (see row info) Yes  ? ? ?

## 2021-08-14 NOTE — Progress Notes (Signed)
Pt has had HR in the 150's since admission, pt came in with Afib RVR.  Metoprolol 2.5 mg IV given earlier no result.  Pt also was given Cardizem and 2 x doses of Metoprolol IV in the ED.  Pt's BP was low earlier, so they did not want to put her on a Cardizem drip and her QTC was prolonged so did not want to put her on a Amiodarone drip as well.  BP 121/92  and earlier was 111/85.   MD notified and ordered another dose of Metoprolol IV 2.5 mg and added Digoxin 0.5 mg IV 1x dose.  Once the Digoxin was given, pt's HR started dropping to the 140's.  2am pt's HR went to 138, BP 125/96, MD ordered another 2.5 mg Metoprolol given at 2:30 AM.  After med was given pt's HR would jump back and forth to 130's to 60's but sustain mostly in the 130's.  At around 4:18 am pt converted to NSR to Sinus Loletha Grayer and even then she would go back to the 130's from time to time.  At around 5:20 am pt stayed in the 50-60's with PAC's and PVC frequently.  MD was notified and aware, pt would at times dip into the 40's, but no S/S.  ECG was done and showed NSR with 1 degree AV block, MD aware of the ECG as well.  Pt also did not sleep one bit til about 6 am or so.  Which her HR was in the 40-50's and pt's BP continued to be WNL.  Will continue to monitor, Thanks Arvella Nigh RN.   ?

## 2021-08-14 NOTE — Consult Note (Signed)
?Cardiology Consultation:  ? ?Patient ID: Bianca [name] ?MRN: [id number]; DOB: 21-Jun-1922 ? ?Admit date: 08/13/2021 ?Date of Consult: 08/14/2021 ? ?PCP:  Bianca Dad, MD ?  ?[facility] Providers ?Cardiologist:   ? ? ?Patient Profile:  ? ?Bianca [name] is a 88 y.o. female with a hx of SVT, AFlutter, HLD, diastolic dysfunction, ?dementia, stroke who is being seen 08/14/2021 for the evaluation of SVT at the request of Dr. Terri Skains. ? ?Historically by chart note, amiodarone stopped for prolonged QT ? ?History of Present Illness:  ? ?Bianca [name] saw Dr. Terri Skains 08/02/21 had been doing well though pt/family noted fast HRs in the week prior, though with her dementia, unable to provide specifics, his review of her SNF vitals looked probably since 4/21 ?Stopped her lopressor and started pindolol ? ?Via phone notes hough SNF reported that the lopressor seemed to work better for her and transitioned back. ? ?She was admitted yesterday for elevated HRs and admitted by medicine team, overnight given dig for RVR and cardiology consulted this AM ?Back in SR ?Given advanced age not felt a good candidate for dig going forward and EP is asked to weigh in. ? ?LABS ?K+ 4.9, 4.5 ?Mag 2.3 ?BUN/Creat 34/1.32 > 34/1.22 (baseline Creat 0.9-1.0) ?HS Trop 15, 15 ?WBC 8.5 ?H/H 15/45 ?Plts 264 ?TSH 5.842 ? ?Dig 1.9 (digoxin 0.125 IV given yesterday afternoon followed by 0.'5mg'$  at 0117 this AM) ? ?On Eliquis for [facility], appropriately dosed ? ?"I have been sick for days", located some discomfort upper abdomen.  Denies N/V ?Has to go to the bathroom currently pretty focused on that ?Denies CP or SOB ?She looks comfortable ?Doesn't like being tied up (heart monitor leads) ?She is AAO to self only ? ? ?Past Medical History:  ?Diagnosis Date  ? Atrial flutter, paroxysmal ([place])   ? COVID-19   ? Dementia ([place])   ? Diastolic dysfunction   ? Hyperlipidemia   ? Hypertension   ? PSVT (paroxysmal supraventricular tachycardia) ([place])   ? Stroke [facility])   ? 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)   ? ? ?History reviewed. No pertinent surgical history.  ? ?Home Medications:  ?Prior to Admission medications   ?Medication Sig Start Date End Date Taking? Authorizing Provider  ?acetaminophen (TYLENOL) 325 MG tablet Take 650 mg by mouth every 6 (six) hours as needed.   Yes [provider]  ?apixaban (ELIQUIS) 2.5 MG TABS tablet Take 1 tablet (2.5 mg total) by mouth 2 (two) times daily. 11/10/19  Yes Hosie Poisson, MD  ?atorvastatin (LIPITOR) 10 MG tablet Take 5 mg by mouth daily. 1/2 a tablet=5 mg   Yes [provider]  ?B Complex-Folic Acid (B COMPLEX VITAMINS, W/ FA, PO) Take 1 tablet by mouth daily. Vitamin B complex - Folic Acid 0.4 mg   Yes [provider]  ?bismuth subsalicylate (PEPTO BISMOL) 262 MG/15ML suspension Take 60 mLs by mouth daily as needed for indigestion or diarrhea or loose stools.    Yes [provider]  ?Calcium Carbonate-Vitamin D 600-400 MG-UNIT tablet Take 1 tablet by mouth daily.   Yes [provider]  ?magic mouthwash SOLN Take 5 mLs by mouth 4 (four) times daily. Swish and spit.   Yes [provider]  ?melatonin 3 MG TABS tablet Take 3 mg by mouth daily at 8 pm.   Yes [provider]  ?metoprolol tartrate (LOPRESSOR) 25 MG tablet Take 25 mg by mouth See admin instructions. 12.5  mg BID + an additional 12.5 mg daily as needed for heart rate > 110 and SBP > 100   Yes [provider]  ?Multiple Vitamins-Minerals (SENTRY SENIOR) TABS Take 1 tablet by mouth daily.   Yes [provider]  ?Nutritional Supplements (FEEDING SUPPLEMENT, BOOST BREEZE,) LIQD Take 1 Bottle by mouth daily.   Yes [provider]  ?omeprazole (PRILOSEC) 20 MG capsule Take 20 mg by mouth daily.    Yes [provider]  ?Sodium Fluoride (PREVIDENT 5000 BOOSTER PLUS) 1.1 % PSTE Place 1 application. onto teeth daily at 8 pm.   Yes  [provider]  ?timolol (TIMOPTIC) 0.5 % ophthalmic solution Place 1 drop into both eyes daily. 08/01/20  Yes [provider]  ?triamcinolone ointment (KENALOG) 0.1 % Apply 1 application. topically 2 (two) times daily.   Yes [provider]  ? ? ?Inpatient Medications: ?Scheduled Meds: ? apixaban  2.5 mg Oral BID  ? atorvastatin  5 mg Oral Daily  ? [START ON 08/15/2021] digoxin  0.125 mg Oral Daily  ? guaiFENesin  1,200 mg Oral BID  ? magic mouthwash  5 mL Oral QID  ? melatonin  3 mg Oral Q2000  ? pantoprazole  40 mg Oral Daily  ? sodium bicarbonate  650 mg Oral BID  ? sodium chloride flush  3 mL Intravenous Q12H  ? ?Continuous Infusions: ? sodium chloride    ? ?PRN Meds: ?sodium chloride, acetaminophen, acetaminophen, levalbuterol, metoprolol tartrate, sodium chloride flush ? ?Allergies:    ?Allergies  ?Allergen Reactions  ? Alphagan P [Brimonidine Tartrate]   ?  Unknown reaction ?Listed on MAR  ? Azopt [Brinzolamide] Other (See Comments)  ?  Unknown reaction ?Listed on MAR  ? Lumigan [Bimatoprost] Other (See Comments)  ?  Unknown reaction ?Listed on MAR  ? ? ?Social History:   ?Social History  ? ?Socioeconomic History  ? Marital status: Widowed  ?  Spouse name: Not on file  ? Number of children: 0  ? Years of education: Not on file  ? Highest education level: Not on file  ?Occupational History  ? Not on file  ?Tobacco Use  ? Smoking status: Never  ? Smokeless tobacco: Never  ?Vaping Use  ? Vaping Use: Never used  ?Substance and Sexual Activity  ? Alcohol use: Never  ? Drug use: Never  ? Sexual activity: Not on file  ?Other Topics Concern  ? Not on file  ?Social History Narrative  ? Not on file  ? ?Social Determinants of Health  ? ?Financial Resource Strain: Not on file  ?Food Insecurity: Not on file  ?Transportation Needs: Not on file  ?Physical Activity: Not on file  ?Stress: Not on file  ?Social Connections: Not on file  ?Intimate Partner Violence: Not on file  ?  ?Family History:   History reviewed. No pertinent family history.  ? ?ROS:  ?Please see the history of present illness.  ?All other ROS reviewed and negative.    ? ?Physical Exam/Data:  ? ?Vitals:  ? 08/14/21 0325 08/14/21 0507 08/14/21 0716 08/14/21 1114  ?BP:  121/64 (!) 129/51 (!) 112/94  ?Pulse:  (!) 52 (!) 53 67  ?Resp:  '20 16 18  '$ ?Temp:   97.9 ?F (36.6 ?C) 97.9 ?F (36.6 ?C)  ?TempSrc:   Oral Oral  ?SpO2:  93% 93% 94%  ?Weight: 52.3 kg     ?Height:      ? ? ?Intake/Output Summary (Last 24 hours) at 08/14/2021 1427 ?Last data  filed at 08/14/2021 1123 ?Gross per 24 hour  ?Intake 420 ml  ?Output 600 ml  ?Net -180 ml  ? ? ?  08/14/2021  ?  3:25 AM 08/13/2021  ?  3:54 PM 08/13/2021  ? 10:08 AM  ?Last 3 Weights  ?Weight (lbs) 115 lb 3.2 oz 115 lb 1.3 oz 112 lb 7 oz  ?Weight (kg) 52.254 kg 52.2 kg 51 kg  ?   ?Body mass index is 24.08 kg/m?.  ?General:  Well nourished, well developed, in no acute distress ?HEENT: normal ?Neck: no JVD ?Vascular: No carotid bruits; Distal pulses 2+ bilaterally ?Cardiac:  RRR; soft SM ?Lungs:  CTA b/l, no wheezing, rhonchi or rales  ?Abd: soft, nontender ?Ext: no edema ?Musculoskeletal:  No deformities, age appropriate atrophy ?Skin: warm and dry  ?Neuro:  no focal abnormalities noted ?Psych:  Normal affect  ? ?EKG:  The EKG was personally reviewed and demonstrates:   ?Short RP tachycardia, perhaps AVNRT, 150bpm ?SR 61bpm, 1st degree AVblock 222m, PAC ? ?OLD ?08/02/21 short RP tachycardia 121bpm ?02/01/21 ? SR 60bpm, PR 1866m? ?Telemetry:  Telemetry was personally reviewed and demonstrates:   ?In/out of her SVT  ?Sinus rates generally 50's-60's, some 40's ?SVT 150's ? ?Relevant CV Studies: ? ? ?11/05/19: TTE ?1. Left ventricular ejection fraction, by estimation, is 60 to 65%. The  ?left ventricle has normal function. The left ventricle has no regional  ?wall motion abnormalities. There is mild left ventricular hypertrophy.  ?Left ventricular diastolic parameters  ?are consistent with Grade II diastolic dysfunction  (pseudonormalization).  ?Elevated left atrial pressure.  ? 2. Right ventricular systolic function is normal. The right ventricular  ?size is normal. There is mildly elevated pulmonary artery systolic  ?pressure. The e

## 2021-08-14 NOTE — Consult Note (Signed)
CARDIOLOGY CONSULT NOTE  ?Patient ID: ?Bianca [name] ?MRN: [id number] ?DOB/AGE: Jun 28, 1922 88 y.o. ? ?Admit date: 08/13/2021 ?Attending physician: Terrilee Croak, MD ?Primary Physician:  Virgie Dad, MD ?Outpatient Cardiologist: Rex Kras, DO, [facility] ?Inpatient Cardiologist: Rex Kras, DO, [facility] ? ?Reason of consultation: Tachycardia ?Referring physician: Dr. Nanda Quinton ? ?Chief complaint: Elevated heart rate ? ?HPI:  ?Bianca [name] is a 88 y.o. Caucasian female who presents with a chief complaint of " elevated heart rate." Her past medical history and cardiovascular risk factors include: Paroxysmal supraventricular tachycardia, paroxysmal atrial flutter/fibrillation, hyperlipidemia, grade 2 diastolic dysfunction, cognitive impairment suggestive of possible age-related dementia/memory difficulties, history of stroke, postmenopausal female, advanced age. ? ?Patient has a history of paroxysmal atrial flutter/fibrillation and has been on amiodarone, metoprolol, and oral anticoagulation given her CHA2DS2-VASc score.  Initially did well until she was noted to have a prolonged QT and therefore amiodarone was discontinued.  She has been managed on rate control strategy.  In addition, she has had episodes of supraventricular tachycardia and during the last office visit EKG illustrated junctional tachycardia.  The documentation that she brought in from her friend's home noted episodes of tachycardia with soft blood pressures and I suspected that she may have paroxysmal atrial flutter/fibrillation. ? ?At last office visit metoprolol was discontinued and was changed to pindolol with holding parameters.  I received a phone call from her friend's home late last week stating that the metoprolol according to the observation was responding better to her heart rate as opposed to pindolol and they requested a transition.  Orders were placed and patient was back on Lopressor. ? ?She now presents to the ED for evaluation of high  heart rate.  Given her underlying dementia/memory issues that she is not the best historian.  She is not aware of the fact that she is at [facility].  She plays with her stuffed animals when she was seen in consultation this morning.  No family present at bedside.  Cardiology has been asked to weigh in with regards to the management of her rhythm. ? ?Reviewing records overnight it appears the patient went into A-fib with RVR and due to her history of prolonged QT she was given digoxin load which she responded to well.  During morning rounds her underlying rhythm is sinus bradycardia. ? ?ALLERGIES: ?Allergies  ?Allergen Reactions  ? Alphagan P [Brimonidine Tartrate]   ?  Unknown reaction ?Listed on MAR  ? Azopt [Brinzolamide] Other (See Comments)  ?  Unknown reaction ?Listed on MAR  ? Lumigan [Bimatoprost] Other (See Comments)  ?  Unknown reaction ?Listed on MAR  ? ? ?PAST MEDICAL HISTORY: ?Past Medical History:  ?Diagnosis Date  ? Atrial flutter, paroxysmal ([place])   ? COVID-19   ? Dementia ([place])   ? Diastolic dysfunction   ? Hyperlipidemia   ? Hypertension   ? PSVT (paroxysmal supraventricular tachycardia) ([place])   ? Stroke [facility])   ? 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 ? ?PAST SURGICAL HISTORY: ?History reviewed. No pertinent surgical history. ? ?FAMILY HISTORY: ?No family history of premature coronary disease or sudden cardiac death ?SOCIAL HISTORY:  ?The patient  reports that she has never smoked. She has never used smokeless tobacco. She reports that she does not drink alcohol and does not use drugs. ? ?MEDICATIONS: ?Current Outpatient Medications  ?Medication Instructions  ? acetaminophen (TYLENOL) 650 mg, Oral, Every 6 hours PRN  ? apixaban (ELIQUIS) 2.5 mg, Oral,  2 times daily  ? atorvastatin (LIPITOR) 5 mg, Oral, Daily, 1/2 a tablet=5 mg  ? B Complex-Folic Acid (B COMPLEX VITAMINS, W/ FA, PO) 1 tablet, Oral, Daily, Vitamin B complex - Folic Acid  0.4 mg  ? bismuth subsalicylate (PEPTO BISMOL) 262 MG/15ML suspension 60 mLs, Oral, Daily PRN  ? Calcium Carbonate-Vitamin D 600-400 MG-UNIT tablet 1 tablet, Oral, Daily  ? magic mouthwash SOLN 5 mLs, Oral, 4 times daily, Swish and spit.  ? melatonin 3 mg, Oral, Daily  ? metoprolol tartrate (LOPRESSOR) 25 mg, Oral, See admin instructions, 12.5 mg BID + an additional 12.5 mg daily as needed for heart rate > 110 and SBP > 100  ? Multiple Vitamins-Minerals (SENTRY SENIOR) TABS 1 tablet, Oral, Daily  ? Nutritional Supplements (FEEDING SUPPLEMENT, BOOST BREEZE,) LIQD 1 Bottle, Oral, Daily  ? omeprazole (PRILOSEC) 20 mg, Oral, Daily  ? pindolol (VISKEN) 5 mg, Oral, 2 times daily, Hold if systolic blood pressure (top number) less than 100 mmHg or pulse less than 60 bpm.  ? Sodium Fluoride (PREVIDENT 5000 BOOSTER PLUS) 1.1 % PSTE 1 application., dental, Daily  ? timolol (TIMOPTIC) 0.5 % ophthalmic solution 1 drop, Both Eyes, Daily  ? triamcinolone ointment (KENALOG) 0.1 % 1 application., Topical, 2 times daily  ? ? ?REVIEW OF SYSTEMS: ?Review of Systems  ?Unable to perform ROS: Dementia  ? ?PHYSICAL EXAM: ? ?  08/14/2021  ?  7:16 AM 08/14/2021  ?  5:07 AM 08/14/2021  ?  3:25 AM  ?Vitals with BMI  ?Weight   115 lbs 3 oz  ?BMI   24.08  ?Systolic 852 778   ?Diastolic 51 64   ?Pulse 53 52   ? ? ? ?Intake/Output Summary (Last 24 hours) at 08/14/2021 0905 ?Last data filed at 08/14/2021 0500 ?Gross per 24 hour  ?Intake 800 ml  ?Output 400 ml  ?Net 400 ml  ?  ?Net IO Since Admission: 500 mL [08/14/21 0905] ? ?CONSTITUTIONAL: Frail, appears older than stated age, hemodynamically stable, no acute distress, plays with stuffed animals.  ?SKIN: Skin is warm and dry. No rash noted. No cyanosis. No pallor. No jaundice ?HEAD: Normocephalic and atraumatic.  ?EYES: No scleral icterus ?MOUTH/THROAT: Moist dry membranes.  ?NECK: No JVD present. No thyromegaly noted. No carotid bruits  ?CHEST Normal respiratory effort. No intercostal retractions  ?LUNGS:  Clear to auscultation bilaterally.  No stridor. No wheezes. No rales.  ?CARDIOVASCULAR: Regular, bradycardia, positive E4-M3, soft systolic murmur at apex, no rubs or gallops appreciated ?ABDOMINAL: Soft, nontender, nondistended, positive bowel sounds in all 4 quadrants, no apparent ascites.  ?EXTREMITIES: no pitting edema, warm to touch.  ?NEUROLOGIC: not oriented to person, place, and time. Nonfocal. Normal muscle tone.  ?PSYCHIATRIC: Cooperative ? ?RADIOLOGY: ?CT CHEST WO CONTRAST ? ?Result Date: 08/13/2021 ?CLINICAL DATA:  Pneumonia suspected on chest x-ray. EXAM: CT CHEST WITHOUT CONTRAST TECHNIQUE: Multidetector CT imaging of the chest was performed following the standard protocol without IV contrast. RADIATION DOSE REDUCTION: This exam was performed according to the departmental dose-optimization program which includes automated exposure control, adjustment of the mA and/or kV according to patient size and/or use of iterative reconstruction technique. COMPARISON:  Aug 13, 2020 chest x-ray FINDINGS: Cardiovascular: Mild calcified atherosclerotic change in the nonaneurysmal aorta. The central pulmonary arteries are normal in caliber. Calcified atherosclerotic changes are identified in the left coronary arteries. Cardiomegaly. Mediastinum/Nodes: The thyroid and esophagus are normal. Bilateral pleural effusions are relatively small. A small pericardial effusion is identified. Shotty nodes in the mediastinum  are likely reactive without gross adenopathy. Chest wall is normal. Lungs/Pleura: Central airways are normal. No pneumothorax. Mild atelectasis is associated with the bilateral pleural effusions. No infiltrate identified to suggest pneumonia or aspiration. No overt edema. No mass or suspicious nodule. Upper Abdomen: There is a 4.4 cm cyst in the left hepatic lobe. Musculoskeletal: No chest wall mass or suspicious bone lesions identified. IMPRESSION: 1. Small bilateral pleural effusions with underlying  atelectasis. 2. Calcified atherosclerotic changes in the nonaneurysmal aorta in the left coronary arteries. 3. Cardiomegaly. 4. Small pericardial effusion. 5. No other significant abnormalities. Aortic Atheroscl

## 2021-08-14 NOTE — Progress Notes (Signed)
?   08/13/21 2348  ?Assess: MEWS Score  ?BP (!) 121/92  ?Pulse Rate (!) 153  ?Resp (!) 22  ?SpO2 92 %  ?O2 Device Nasal Cannula  ?O2 Flow Rate (L/min) 2 L/min  ?Assess: MEWS Score  ?MEWS Temp 0  ?MEWS Systolic 0  ?MEWS Pulse 3  ?MEWS RR 1  ?MEWS LOC 0  ?MEWS Score 4  ?MEWS Score Color Red  ?Assess: if the MEWS score is Yellow or Red  ?Were vital signs taken at a resting state? Yes  ?Focused Assessment No change from prior assessment  ?Early Detection of Sepsis Score *See Row Information* Low  ?MEWS guidelines implemented *See Row Information* No, other (Comment) ?(pt's HR still in Afib, med given but no improvement, MD notified)  ?Document  ?Patient Outcome Stabilized after interventions  ?Progress note created (see row info) Yes  ? ? ?

## 2021-08-14 NOTE — Progress Notes (Signed)
Heart Failure Navigator Progress Note ? ?Assessed for Heart & Vascular TOC clinic readiness.  ?Patient does not meet criteria due to seen by [facility]iology.  ? ? ?Earnestine Leys, BSN, RN ?Heart Failure Nurse Navigator ?Secure Chat Only   ?

## 2021-08-14 NOTE — Progress Notes (Signed)
?   08/14/21 0210  ?Assess: MEWS Score  ?BP (!) 125/96  ?Pulse Rate (!) 139  ?Assess: MEWS Score  ?MEWS Temp 0  ?MEWS Systolic 0  ?MEWS Pulse 3  ?MEWS RR 1  ?MEWS LOC 0  ?MEWS Score 4  ?MEWS Score Color Red  ?Assess: if the MEWS score is Yellow or Red  ?Were vital signs taken at a resting state? Yes  ?Focused Assessment No change from prior assessment  ?Early Detection of Sepsis Score *See Row Information* Low  ?MEWS guidelines implemented *See Row Information* No, previously red, continue vital signs every 4 hours  ?Document  ?Patient Outcome Stabilized after interventions  ?Progress note created (see row info) Yes  ? ? ?

## 2021-08-15 DIAGNOSIS — I471 Supraventricular tachycardia: Secondary | ICD-10-CM | POA: Diagnosis not present

## 2021-08-15 LAB — BASIC METABOLIC PANEL
Anion gap: 7 (ref 5–15)
BUN: 29 mg/dL — ABNORMAL HIGH (ref 8–23)
CO2: 21 mmol/L — ABNORMAL LOW (ref 22–32)
Calcium: 8 mg/dL — ABNORMAL LOW (ref 8.9–10.3)
Chloride: 114 mmol/L — ABNORMAL HIGH (ref 98–111)
Creatinine, Ser: 1.12 mg/dL — ABNORMAL HIGH (ref 0.44–1.00)
GFR, Estimated: 45 mL/min — ABNORMAL LOW (ref >60)
Glucose, Bld: 82 mg/dL (ref 70–99)
Potassium: 3.9 mmol/L (ref 3.5–5.1)
Sodium: 142 mmol/L (ref 135–145)

## 2021-08-15 MED ORDER — METOPROLOL TARTRATE 12.5 MG HALF TABLET
12.5000 mg | ORAL_TABLET | Freq: Two times a day (BID) | ORAL | Status: DC
  Administered 2021-08-15 (×2): 12.5 mg via ORAL
  Filled 2021-08-15 (×3): qty 1

## 2021-08-15 NOTE — Progress Notes (Signed)
?PROGRESS NOTE ? ?Bianca [name]  ?DOB: 05-Jan-1923  ?PCP: Virgie Dad, MD ?BJS:[id number]  ?DOA: 08/13/2021 ? LOS: 2 days  ?Hospital Day: 3 ? ?Brief narrative: ?Bianca [name] is a 88 y.o. female with PMH significant for HTN, HLD, paroxysmal a flutter, PSVT, diastolic dysfunction, stroke ?Patient presented to the ED from an assisted living facility on 5/7 with complaint of worsening shortness of breath and palpitation. ? ?Patient has chronic PAF versus PSVT, in the past, has tried multiple rate control medications, failed amiodarone because of prolonged QTc, who has had uncontrolled palpitations lately since late April.  She has been following with cardiologist who started patient on Pindolol in addition to Metoprolol. Last several days, patient has been experiencing palpitations and progressively worsening shortness of breath.  No chest pain or cough or wheezing. ? ?In the ED, patient was noted to be tachycardic to 150s, EKG showed SVT.  She was given adenosine x2, Cardizem x1 after which heart rate got controlled briefly but started having SVT again. ?Cardiologist Dr. Einar Gip was consulted. ?Admitted to hospitalist service ? ?Subjective: ?Patient was seen and examined this morning.  ?Lying on bed.  Not in distress.  No palpitation.  Heart rate gradually improving. ?Pending EP recommendation. ? ?Principal Problem: ?  SVT (supraventricular tachycardia) ([place]) ?Active Problems: ?  CKD (chronic kidney disease) stage 3, GFR 30-59 ml/min (HCC) ?  Tachycardia ?  Diastolic dysfunction ?  ? ?Assessment and Plan: ?pSVT vs a flutter ?-Failed outpatient management, poor response to beta-blocker, and blood pressure too low for continuous Cardizem infusion.   ?-In the ED, she was given adenosine x2, digoxin 0.125 mg x 1, Cardizem bolus ?-Cardiology consulted.  Digoxin stopped.  Currently on metoprolol.  Pending recommendation by EP Dr. Quentin Ore. ?-Continue Eliquis for anticoagulation. ?  ?Chronic diastolic CHF  decompensation ?-Shortness of breath mostly due to arrhythmia.  Does not look volume overloaded.  Chest x-ray without evidence of congestion.  ?-Currently not on diuresis. ? ?Hyperlipidemia ?-Continue statin ?  ?CKD stage IV ?Chronic metabolic acidosis ?-Creatinine at baseline.  Sodium bicarbonate started for chronic metabolic acidosis ?Recent Labs  ?  01/19/21 ?0000 06/08/21 ?0000 08/13/21 ?1027 08/14/21 ?0336 08/15/21 ?0353  ?BUN 36* 23* 34* 34* 29*  ?CREATININE 1.2* 0.9 1.32* 1.22* 1.12*  ?CO2 18 25* 17* 20* 21*  ?  ?Goals of care ?  Code Status: DNR  ? ? ?Mobility: Encourage ambulation.  PT recommended home with PT ? ?Skin assessment:  ?  ? ?Nutritional status:  ?Body mass index is 24.33 kg/m?.  ?  ?  ? ? ? ? ?Diet:  ?Diet Order   ? ?       ?  Diet Heart Room service appropriate? Yes; Fluid consistency: Thin; Fluid restriction: 1800 mL Fluid  Diet effective now       ?  ? ?  ?  ? ?  ? ? ?DVT prophylaxis:  ?apixaban (ELIQUIS) tablet 2.5 mg Start: 08/13/21 2200 ?apixaban (ELIQUIS) tablet 2.5 mg   ?Antimicrobials: None ?Fluid: None ?Consultants: Cardiology ?Family Communication: None at bedside ? ?Status is: Inpatient ? ?Continue in-hospital care because: Pending recommendation by EP Dr. Quentin Ore ?Level of care: Progressive  ? ?Dispo: The patient is from: Friend's home [place] ?             Anticipated d/c is to: Hopefully this afternoon versus tomorrow. ?             Patient currently is not medically stable to d/c. ?  Difficult to place patient No ? ? ? ? ?Infusions:  ? sodium chloride    ? ? ?Scheduled Meds: ? apixaban  2.5 mg Oral BID  ? atorvastatin  5 mg Oral Daily  ? guaiFENesin  1,200 mg Oral BID  ? magic mouthwash  5 mL Oral QID  ? melatonin  3 mg Oral Q2000  ? metoprolol tartrate  12.5 mg Oral BID  ? pantoprazole  40 mg Oral Daily  ? sodium bicarbonate  650 mg Oral BID  ? sodium chloride flush  3 mL Intravenous Q12H  ? timolol  1 drop Both Eyes Daily  ? ? ?PRN meds: ?sodium chloride, acetaminophen,  acetaminophen, levalbuterol, metoprolol tartrate, sodium chloride flush  ? ?Antimicrobials: ?Anti-infectives (From admission, onward)  ? ? None  ? ?  ? ? ?Objective: ?Vitals:  ? 08/15/21 0454 08/15/21 1315  ?BP: 116/60 (!) 138/92  ?Pulse: 68 (!) 139  ?Resp: 19   ?Temp: 97.8 ?F (36.6 ?C)   ?SpO2: 97%   ? ? ?Intake/Output Summary (Last 24 hours) at 08/15/2021 1519 ?Last data filed at 08/15/2021 1115 ?Gross per 24 hour  ?Intake 120 ml  ?Output 450 ml  ?Net -330 ml  ? ?Filed Weights  ? 08/13/21 1554 08/14/21 0325 08/15/21 0454  ?Weight: 52.2 kg 52.3 kg 52.8 kg  ? ?Weight change: 1.8 kg ?Body mass index is 24.33 kg/m?.  ? ?Physical Exam: ?General exam: Pleasant, elderly Caucasian female.  Sitting up in chair.  Not in distress ?Skin: No rashes, lesions or ulcers. ?HEENT: Atraumatic, normocephalic, no obvious bleeding ?Lungs: Clear to auscultation bilaterally ?CVS: Irregular rhythm. ?GI/Abd soft, nontender, nondistended, bowel sound present ?CNS: Alert, awake, hard of hearing, slow to respond ?Psychiatry: Mood appropriate ?Extremities: No pedal edema, no calf tenderness ? ?Data Review: I have personally reviewed the laboratory data and studies available. ? ?F/u labs ordered ?Unresulted Labs (From admission, onward)  ? ?  Start     Ordered  ? 08/13/21 1403  Urinalysis, Routine w reflex microscopic  Once,   R       ? 08/13/21 1402  ? Unscheduled  CBC with Differential/Platelet  Tomorrow morning,   R       ? 08/15/21 1519  ? Unscheduled  Basic metabolic panel  Tomorrow morning,   R       ? 08/15/21 1519  ? ?  ?  ? ?  ? ? ?Signed, ?Terrilee Croak, MD ?[facility] ?08/15/2021 ? ? ? ? ? ? ? ? ? ? ? ? ?

## 2021-08-15 NOTE — Plan of Care (Signed)

## 2021-08-15 NOTE — Progress Notes (Signed)
? ?Progress Note ? ?Patient Name: Bianca [name] ?Date of Encounter: 08/15/2021 ? ?[facility] Cardiologist: Dr. Terri Skains ? ?Subjective  ? ?Resting confoirtably, easily woken, " I feel fine sweetie" ? ?Inpatient Medications  ?  ?Scheduled Meds: ? apixaban  2.5 mg Oral BID  ? atorvastatin  5 mg Oral Daily  ? guaiFENesin  1,200 mg Oral BID  ? magic mouthwash  5 mL Oral QID  ? melatonin  3 mg Oral Q2000  ? metoprolol tartrate  12.5 mg Oral BID  ? pantoprazole  40 mg Oral Daily  ? sodium bicarbonate  650 mg Oral BID  ? sodium chloride flush  3 mL Intravenous Q12H  ? timolol  1 drop Both Eyes Daily  ? ?Continuous Infusions: ? sodium chloride    ? ?PRN Meds: ?sodium chloride, acetaminophen, acetaminophen, levalbuterol, metoprolol tartrate, sodium chloride flush  ? ?Vital Signs  ?  ?Vitals:  ? 08/14/21 1528 08/14/21 2027 08/15/21 0018 08/15/21 0454  ?BP: 126/61 (!) 116/53 (!) 110/52 116/60  ?Pulse: 64 67  68  ?Resp: 16 (!) 22 (!) 24 19  ?Temp: 97.8 ?F (36.6 ?C) 98.1 ?F (36.7 ?C) 98.2 ?F (36.8 ?C) 97.8 ?F (36.6 ?C)  ?TempSrc: Oral Oral Oral Oral  ?SpO2: 94% 91% 96% 97%  ?Weight:    52.8 kg  ?Height:      ? ? ?Intake/Output Summary (Last 24 hours) at 08/15/2021 0959 ?Last data filed at 08/15/2021 3790 ?Gross per 24 hour  ?Intake 240 ml  ?Output 550 ml  ?Net -310 ml  ? ? ?  08/15/2021  ?  4:54 AM 08/14/2021  ?  3:25 AM 08/13/2021  ?  3:54 PM  ?Last 3 Weights  ?Weight (lbs) 116 lb 6.5 oz 115 lb 3.2 oz 115 lb 1.3 oz  ?Weight (kg) 52.8 kg 52.254 kg 52.2 kg  ?   ? ?Telemetry  ?  ?SB/SR generally 60's, some 50's, SVTs 140's-150's, less though - Personally Reviewed ? ?ECG  ?  ?No new EKGs - Personally Reviewed ? ?Physical Exam  ? ?GEN: No acute distress, resting comfortably as I enter the room ?Neck: No JVD ?Cardiac: RRR, no murmurs, rubs, or gallops.  ?Respiratory: Clear to auscultation bilaterally. ?GI: Soft, nontender, non-distended  ?MS: No edema; No deformity. ?Neuro:  known baseline dementia  ?Psych: Normal affect  ? ?Labs  ?  ?High  Sensitivity Troponin:   ?Recent Labs  ?Lab 08/13/21 ?1027 08/13/21 ?1316  ?TROPONINIHS 15 15  ?   ?Chemistry ?Recent Labs  ?Lab 08/13/21 ?1027 08/13/21 ?1336 08/13/21 ?1420 08/14/21 ?0336 08/15/21 ?0353  ?NA 140  --  142 141 142  ?K 4.9  --  4.8 4.5 3.9  ?CL 111  --   --  114* 114*  ?CO2 17*  --   --  20* 21*  ?GLUCOSE 206*  --   --  97 82  ?BUN 34*  --   --  34* 29*  ?CREATININE 1.32*  --   --  1.22* 1.12*  ?CALCIUM 8.6*  --   --  8.3* 8.0*  ?MG  --  2.3  --   --   --   ?PROT 6.0*  --   --   --   --   ?ALBUMIN 3.5  --   --   --   --   ?AST 37  --   --   --   --   ?ALT 25  --   --   --   --   ?  ALKPHOS 80  --   --   --   --   ?BILITOT 1.0  --   --   --   --   ?GFRNONAA 37*  --   --  40* 45*  ?ANIONGAP 12  --   --  7 7  ?  ?Lipids No results for input(s): CHOL, TRIG, HDL, LABVLDL, LDLCALC, CHOLHDL in the last 168 hours.  ?Hematology ?Recent Labs  ?Lab 08/13/21 ?1027 08/13/21 ?1420  ?WBC 8.5  --   ?RBC 4.72  --   ?HGB 14.3 15.3*  ?HCT 46.1* 45.0  ?MCV 97.7  --   ?MCH 30.3  --   ?MCHC 31.0  --   ?RDW 16.1*  --   ?PLT 264  --   ? ?Thyroid  ?Recent Labs  ?Lab 08/13/21 ?1336  ?TSH 5.842*  ?  ?BNPNo results for input(s): BNP, PROBNP in the last 168 hours.  ?DDimer No results for input(s): DDIMER in the last 168 hours.  ? ?Radiology  ?  ?CT CHEST WO CONTRAST ? ?Result Date: 08/13/2021 ?CLINICAL DATA:  Pneumonia suspected on chest x-ray. EXAM: CT CHEST WITHOUT CONTRAST TECHNIQUE: Multidetector CT imaging of the chest was performed following the standard protocol without IV contrast. RADIATION DOSE REDUCTION: This exam was performed according to the departmental dose-optimization program which includes automated exposure control, adjustment of the mA and/or kV according to patient size and/or use of iterative reconstruction technique. COMPARISON:  Aug 13, 2020 chest x-ray FINDINGS: Cardiovascular: Mild calcified atherosclerotic change in the nonaneurysmal aorta. The central pulmonary arteries are normal in caliber. Calcified  atherosclerotic changes are identified in the left coronary arteries. Cardiomegaly. Mediastinum/Nodes: The thyroid and esophagus are normal. Bilateral pleural effusions are relatively small. A small pericardial effusion is identified. Shotty nodes in the mediastinum are likely reactive without gross adenopathy. Chest wall is normal. Lungs/Pleura: Central airways are normal. No pneumothorax. Mild atelectasis is associated with the bilateral pleural effusions. No infiltrate identified to suggest pneumonia or aspiration. No overt edema. No mass or suspicious nodule. Upper Abdomen: There is a 4.4 cm cyst in the left hepatic lobe. Musculoskeletal: No chest wall mass or suspicious bone lesions identified. IMPRESSION: 1. Small bilateral pleural effusions with underlying atelectasis. 2. Calcified atherosclerotic changes in the nonaneurysmal aorta in the left coronary arteries. 3. Cardiomegaly. 4. Small pericardial effusion. 5. No other significant abnormalities. Aortic Atherosclerosis (ICD10-I70.0). Electronically Signed   By: Dorise Bullion III M.D.   On: 08/13/2021 14:50  ? ?DG Chest Port 1 View ? ?Result Date: 08/13/2021 ?CLINICAL DATA:  Tachycardia EXAM: PORTABLE CHEST 1 VIEW COMPARISON:  Chest x-ray dated November 02, 2019 FINDINGS: Cardiac and mediastinal contours are unchanged. New left basilar opacity. No large pleural effusion or pneumothorax. IMPRESSION: New mild left basilar opacity, differential considerations include atelectasis or infection/aspiration. Electronically Signed   By: Yetta Glassman M.D.   On: 08/13/2021 11:42   ? ?Cardiac Studies  ? ?11/05/19: TTE ?1. Left ventricular ejection fraction, by estimation, is 60 to 65%. The  ?left ventricle has normal function. The left ventricle has no regional  ?wall motion abnormalities. There is mild left ventricular hypertrophy.  ?Left ventricular diastolic parameters  ?are consistent with Grade II diastolic dysfunction (pseudonormalization).  ?Elevated left atrial  pressure.  ? 2. Right ventricular systolic function is normal. The right ventricular  ?size is normal. There is mildly elevated pulmonary artery systolic  ?pressure. The estimated right ventricular systolic pressure is 32.6 mmHg.  ? 3. Right atrial size was mildly dilated.  ?  4. The mitral valve is normal in structure. Trivial mitral valve  ?regurgitation.  ? 5. Tricuspid valve regurgitation is moderate to severe.  ? 6. The aortic valve is tricuspid. Aortic valve regurgitation is mild.  ?Mild aortic valve sclerosis is present, with no evidence of aortic valve  ?stenosis.  ? 7. The inferior vena cava is normal in size with greater than 50%  ?respiratory variability, suggesting right atrial pressure of 3 mmHg.  ?  ? ?Patient Profile  ?   ?88 y.o. female with a hx of SVT, AFlutter, HLD, diastolic dysfunction, dementia, stroke admitted with SVT ? ?Historically by chart note, amiodarone stopped for prolonged QT ? ?Assessment & Plan  ?  ?SVT ?asymptomatic ?PAFib by history ?CHA2DS2Vasc is 3 on Eliquis appropriately dosed for age/weight ? ?Less burden of tachycardia yesterday afternoon ?Though recurrent this AM ?Agree with resumption of her home lopressor and titrate as able to BP and sinus rate tolerance ?Avoid digoxin ?Dr. Quentin Ore will see later today, suspect will be OK to discharge once he has rounded ? ?Further as per her attending cardiologist/team ? ?For questions or updates, please contact [place] ?Please consult [email or web address] for contact info under  ? ?  ?   ?Signed, ?Baldwin Jamaica, PA-C  ?08/15/2021, 9:59 AM   ? ?

## 2021-08-15 NOTE — Progress Notes (Signed)
Progress Note ? ?Patient Name: Bianca [name] ?Date of Encounter: 08/15/2021 ? ?Attending physician: Terrilee Croak, MD ?Primary care provider: Virgie Dad, MD ?Primary Cardiologist: Rex Kras, DO, [facility] ? ?Subjective: ?Bianca [name] is a 88 y.o. female who was seen and examined at bedside  ?Resting in bed comfortably. ?No events overnight ?Denies CP or HF symptoms - accuracy limited due to dementia.  ?No family at bedside.  ? ?Objective: ?Vital Signs in the last 24 hours: ?Temp:  [97.8 ?F (36.6 ?C)-98.2 ?F (36.8 ?C)] 98.2 ?F (36.8 ?C) (05/09 2032) ?Pulse Rate:  [56-139] 56 (05/09 2032) ?Resp:  [18-24] 18 (05/09 2032) ?BP: (110-138)/(52-92) 126/56 (05/09 2032) ?SpO2:  [96 %-98 %] 98 % (05/09 2032) ?Weight:  [52.8 kg] 52.8 kg (05/09 0454) ? ?Intake/Output: ? ?Intake/Output Summary (Last 24 hours) at 08/15/2021 2337 ?Last data filed at 08/15/2021 2210 ?Gross per 24 hour  ?Intake 480 ml  ?Output 350 ml  ?Net 130 ml  ?  ?Net IO Since Admission: 470 mL [08/15/21 2337] ? ?Weights:  ?Filed Weights  ? 08/13/21 1554 08/14/21 0325 08/15/21 0454  ?Weight: 52.2 kg 52.3 kg 52.8 kg  ? ? ?Telemetry: Personally reviewed.  Predominately normal sinus with rare episodes of SVT.  ? ?Physical examination: ?PHYSICAL EXAM: ? ?  08/15/2021  ?  8:32 PM 08/15/2021  ?  4:00 PM 08/15/2021  ?  1:15 PM  ?Vitals with BMI  ?Systolic 401 027 253  ?Diastolic 56 64 92  ?Pulse 56 58 139  ? ? ?General: No acute distress, hemodynamically stable, resting comfortably ?HEENT: Normocephalic, atraumatic, no scleral icterus or xanthelasmas, no JVP, trachea midline ?Lungs: Clear to auscultation bilaterally.  No wheezes rales or rhonchi's. ?Heart: Regular, positive S1-S2, no murmurs rubs or gallops appreciated. ?Abdomen: Soft, nontender, nondistended, positive bowel sounds in all 4 quadrants ?Extremities: No pitting edema, warm to touch ?Neuro: Pleasantly demented, alert oriented x1, baseline dementia ?Psych: Cooperative ? ?Lab Results: ?Hematology ?Recent Labs   ?Lab 08/13/21 ?1027 08/13/21 ?1420  ?WBC 8.5  --   ?RBC 4.72  --   ?HGB 14.3 15.3*  ?HCT 46.1* 45.0  ?MCV 97.7  --   ?MCH 30.3  --   ?MCHC 31.0  --   ?RDW 16.1*  --   ?PLT 264  --   ? ? ?Chemistry ?Recent Labs  ?Lab 08/13/21 ?1027 08/13/21 ?1420 08/14/21 ?0336 08/15/21 ?0353  ?NA 140 142 141 142  ?K 4.9 4.8 4.5 3.9  ?CL 111  --  114* 114*  ?CO2 17*  --  20* 21*  ?GLUCOSE 206*  --  97 82  ?BUN 34*  --  34* 29*  ?CREATININE 1.32*  --  1.22* 1.12*  ?CALCIUM 8.6*  --  8.3* 8.0*  ?PROT 6.0*  --   --   --   ?ALBUMIN 3.5  --   --   --   ?AST 37  --   --   --   ?ALT 25  --   --   --   ?ALKPHOS 80  --   --   --   ?BILITOT 1.0  --   --   --   ?GFRNONAA 37*  --  40* 45*  ?ANIONGAP 12  --  7 7  ?  ? ?Cardiac Enzymes: ?Cardiac Panel (last 3 results) ?Recent Labs  ?  08/13/21 ?1027 08/13/21 ?1316  ?TROPONINIHS 15 15  ? ? ?BNP (last 3 results) ?No results for input(s): BNP in the last 8760 hours. ? ?ProBNP (  last 3 results) ?No results for input(s): PROBNP in the last 8760 hours. ? ? ?DDimer No results for input(s): DDIMER in the last 168 hours.  ? ?Hemoglobin A1c:  ?Lab Results  ?Component Value Date  ? HGBA1C 5.9 (H) 11/04/2019  ? MPG 122.63 11/04/2019  ? ? ?TSH  ?Recent Labs  ?  08/13/21 ?1336  ?TSH 5.842*  ? ? ?Lipid Panel  ?   ?Component Value Date/Time  ? CHOL 78 06/08/2021 0000  ? TRIG 49 06/08/2021 0000  ? HDL 46 06/08/2021 0000  ? CHOLHDL 2.8 11/06/2019 0544  ? VLDL 17 11/06/2019 0544  ? [place] 18 06/08/2021 0000  ? ? ?Imaging: ?No results found. ? ?CARDIAC DATABASE: ?EKG: ?02/01/2021: Normal sinus rhythm, 60 bpm, left axis deviation, poor R wave progression, without underlying injury pattern.  QT 424 ms. ?  ?08/02/2021: Junctional tachycardia with retrograde P wave conduction, poor R wave progression.  Prior EKG notes normal sinus rhythm. ?  ?08/13/2021: Junctional tachycardia, 150 bpm, right axis deviation without underlying injury pattern. ?  ?08/14/2021: Sinus rhythm, 61 bpm, first-degree AV block, TWI high lateral and  lateral leads suggestive of possible ischemia, without underlying injury pattern. ?  ?Echocardiogram: ?10/27/2019:  ?1. Left ventricular ejection fraction, by estimation, is 60 to 65%. The  left ventricle has normal function. The left ventricle has no regional  ?wall motion abnormalities. There is mild left ventricular hypertrophy.  Left ventricular diastolic parameters  ?are consistent with Grade II diastolic dysfunction (pseudonormalization).  Elevated left atrial pressure.  ? 2. Right ventricular systolic function is normal. The right ventricular  size is normal. There is mildly elevated pulmonary artery systolic  ?pressure. The estimated right ventricular systolic pressure is 37.3 mmHg.  ? 3. Right atrial size was mildly dilated.  ? 4. The mitral valve is normal in structure. Trivial mitral valve  regurgitation.  ? 5. Tricuspid valve regurgitation is moderate to severe.  ? 6. The aortic valve is tricuspid. Aortic valve regurgitation is mild.  Mild aortic valve sclerosis is present, with no evidence of aortic valve stenosis.  ? 7. The inferior vena cava is normal in size with greater than 50%  respiratory variability, suggesting right atrial pressure of 3 mmHg.  ?  ?Carotid artery duplex  11/06/2019: ?Right Carotid: Velocities in the right ICA are consistent with a 1-39%  stenosis.  ?Left Carotid: Velocities in the left ICA are consistent with a 1-39%  stenosis.  ?Vertebrals: Bilateral vertebral arteries demonstrate antegrade flow.  ? ?Scheduled Meds: ? apixaban  2.5 mg Oral BID  ? atorvastatin  5 mg Oral Daily  ? guaiFENesin  1,200 mg Oral BID  ? magic mouthwash  5 mL Oral QID  ? melatonin  3 mg Oral Q2000  ? metoprolol tartrate  12.5 mg Oral BID  ? pantoprazole  40 mg Oral Daily  ? sodium bicarbonate  650 mg Oral BID  ? sodium chloride flush  3 mL Intravenous Q12H  ? timolol  1 drop Both Eyes Daily  ? ? ?Continuous Infusions: ? sodium chloride    ? ? ?PRN Meds: ?sodium chloride, acetaminophen, acetaminophen,  levalbuterol, metoprolol tartrate, sodium chloride flush  ? ?IMPRESSION & RECOMMENDATIONS: ?Bianca [name] is a 88 y.o. Caucasian female whose past medical history and cardiac risk factors include: Paroxysmal supraventricular tachycardia, paroxysmal atrial flutter/fibrillation, hyperlipidemia, grade 2 diastolic dysfunction, cognitive impairment suggestive of possible age-related dementia/memory difficulties, history of stroke, postmenopausal female, advanced age. ? 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 ?Mixed hyperlipidemia ?Diastolic dysfunction. ?History of stroke ? ?Plan: ?Given her history of paroxysmal atrial fibrillation patient has been on rate control strategy with metoprolol and oral anticoagulation for thromboembolic prophylaxis given her history of stroke.  Patient was on amiodarone for rhythm control but discontinued due to history of prolonged QT.  During her last office visit as part intention metoprolol was being held due to soft blood pressures and rapid heart rate.  EKG performed in the office concerning for junctional tachycardia ?metoprolol tartrate transitioned to pindolol.  ? ?Last week Friday her SNF reached out due to her underlying tachycardia and she was transitioned back to Lopressor. However, over the weekend presents to ED for symptomatic tachycardia due to SVT.  ? ?Telemetry noted paroxsymal episodes of SVT and Afib w/ RVR. She was loaded w/ digoxin by primary team over night and it help her ventricular rate and was back to SR/SB.  ? ?Given her advance age, dementia, poor oral intake, and frailty long term digoxin is not ideal as she is predisposed to digoxin toxicity. Keep that in mind I discontinued digoxin and consulted EP for further guidance given her rhythm disturbance and limited pharmacological options. Appreciate EP evaluation / recommendations by Dr. Quentin Ore  who has reviewed the EKGs and telemetry and suspects her PSVT is likely due to AVNRT.  ? ?I have started her on Lopressor 12.'5mg'$  po bid for now w/ holding parameters. Will follow.  ? ?Clinically it would be

## 2021-08-15 NOTE — TOC Progression Note (Signed)
Transition of Care (TOC) - Progression Note  ? ? ?Patient Details  ?Name: Bianca [name] ?MRN: [id number] ?Date of Birth: January 02, 1923 ? ?Transition of Care (TOC) CM/SW Contact  ?Carlena Ruybal Renold Don, LCSWA ?Phone Number: ?08/15/2021, 3:13 PM ? ?Clinical Narrative:    ?Pt from [facility], not ready for DC. CSW will continue to follow for DC planning needs.  ? ? ?  ?  ? ?Expected Discharge Plan and Services ?  ?  ?  ?  ?  ?                ?  ?  ?  ?  ?  ?  ?  ?  ?  ?  ? ? ?Social Determinants of Health (SDOH) Interventions ?  ? ?Readmission Risk Interventions ? ?  01/11/2020  ? 10:45 AM  ?Readmission Risk Prevention Plan  ?Transportation Screening Complete  ?PCP or Specialist Appt within 5-7 Days Complete  ?Home Care Screening Complete  ?Medication Review (RN CM) Referral to Pharmacy  ? ? ?

## 2021-08-16 ENCOUNTER — Encounter: Payer: Self-pay | Admitting: Orthopedic Surgery

## 2021-08-16 ENCOUNTER — Non-Acute Institutional Stay (SKILLED_NURSING_FACILITY): Payer: Medicare Other | Admitting: Orthopedic Surgery

## 2021-08-16 DIAGNOSIS — I471 Supraventricular tachycardia: Secondary | ICD-10-CM

## 2021-08-16 DIAGNOSIS — M6281 Muscle weakness (generalized): Secondary | ICD-10-CM | POA: Diagnosis not present

## 2021-08-16 DIAGNOSIS — Z79899 Other long term (current) drug therapy: Secondary | ICD-10-CM | POA: Diagnosis not present

## 2021-08-16 DIAGNOSIS — I639 Cerebral infarction, unspecified: Secondary | ICD-10-CM | POA: Diagnosis not present

## 2021-08-16 DIAGNOSIS — K146 Glossodynia: Secondary | ICD-10-CM | POA: Diagnosis not present

## 2021-08-16 DIAGNOSIS — J90 Pleural effusion, not elsewhere classified: Secondary | ICD-10-CM | POA: Diagnosis not present

## 2021-08-16 DIAGNOSIS — R9431 Abnormal electrocardiogram [ECG] [EKG]: Secondary | ICD-10-CM | POA: Diagnosis not present

## 2021-08-16 DIAGNOSIS — I509 Heart failure, unspecified: Secondary | ICD-10-CM | POA: Diagnosis not present

## 2021-08-16 DIAGNOSIS — R0789 Other chest pain: Secondary | ICD-10-CM | POA: Diagnosis not present

## 2021-08-16 DIAGNOSIS — K219 Gastro-esophageal reflux disease without esophagitis: Secondary | ICD-10-CM | POA: Diagnosis not present

## 2021-08-16 DIAGNOSIS — F01B0 Vascular dementia, moderate, without behavioral disturbance, psychotic disturbance, mood disturbance, and anxiety: Secondary | ICD-10-CM

## 2021-08-16 DIAGNOSIS — Z8673 Personal history of transient ischemic attack (TIA), and cerebral infarction without residual deficits: Secondary | ICD-10-CM | POA: Diagnosis not present

## 2021-08-16 DIAGNOSIS — R2681 Unsteadiness on feet: Secondary | ICD-10-CM | POA: Diagnosis not present

## 2021-08-16 DIAGNOSIS — R001 Bradycardia, unspecified: Secondary | ICD-10-CM | POA: Diagnosis not present

## 2021-08-16 DIAGNOSIS — N183 Chronic kidney disease, stage 3 unspecified: Secondary | ICD-10-CM | POA: Diagnosis not present

## 2021-08-16 DIAGNOSIS — Z66 Do not resuscitate: Secondary | ICD-10-CM | POA: Diagnosis not present

## 2021-08-16 DIAGNOSIS — I4892 Unspecified atrial flutter: Secondary | ICD-10-CM | POA: Diagnosis not present

## 2021-08-16 DIAGNOSIS — R079 Chest pain, unspecified: Secondary | ICD-10-CM | POA: Diagnosis not present

## 2021-08-16 DIAGNOSIS — R1312 Dysphagia, oropharyngeal phase: Secondary | ICD-10-CM | POA: Diagnosis not present

## 2021-08-16 DIAGNOSIS — Z8616 Personal history of COVID-19: Secondary | ICD-10-CM | POA: Diagnosis not present

## 2021-08-16 DIAGNOSIS — Z7901 Long term (current) use of anticoagulants: Secondary | ICD-10-CM | POA: Diagnosis not present

## 2021-08-16 DIAGNOSIS — Z9181 History of falling: Secondary | ICD-10-CM | POA: Diagnosis not present

## 2021-08-16 DIAGNOSIS — F339 Major depressive disorder, recurrent, unspecified: Secondary | ICD-10-CM | POA: Diagnosis not present

## 2021-08-16 DIAGNOSIS — I480 Paroxysmal atrial fibrillation: Secondary | ICD-10-CM | POA: Diagnosis not present

## 2021-08-16 DIAGNOSIS — N1831 Chronic kidney disease, stage 3a: Secondary | ICD-10-CM

## 2021-08-16 DIAGNOSIS — I5032 Chronic diastolic (congestive) heart failure: Secondary | ICD-10-CM | POA: Diagnosis not present

## 2021-08-16 DIAGNOSIS — R2689 Other abnormalities of gait and mobility: Secondary | ICD-10-CM | POA: Diagnosis not present

## 2021-08-16 DIAGNOSIS — Z743 Need for continuous supervision: Secondary | ICD-10-CM | POA: Diagnosis not present

## 2021-08-16 DIAGNOSIS — R279 Unspecified lack of coordination: Secondary | ICD-10-CM | POA: Diagnosis not present

## 2021-08-16 DIAGNOSIS — R4189 Other symptoms and signs involving cognitive functions and awareness: Secondary | ICD-10-CM | POA: Diagnosis not present

## 2021-08-16 DIAGNOSIS — I5033 Acute on chronic diastolic (congestive) heart failure: Secondary | ICD-10-CM | POA: Diagnosis not present

## 2021-08-16 DIAGNOSIS — Z7401 Bed confinement status: Secondary | ICD-10-CM | POA: Diagnosis not present

## 2021-08-16 DIAGNOSIS — R6889 Other general symptoms and signs: Secondary | ICD-10-CM | POA: Diagnosis not present

## 2021-08-16 DIAGNOSIS — I499 Cardiac arrhythmia, unspecified: Secondary | ICD-10-CM | POA: Diagnosis not present

## 2021-08-16 DIAGNOSIS — Z515 Encounter for palliative care: Secondary | ICD-10-CM | POA: Diagnosis not present

## 2021-08-16 DIAGNOSIS — E782 Mixed hyperlipidemia: Secondary | ICD-10-CM | POA: Diagnosis not present

## 2021-08-16 DIAGNOSIS — I4891 Unspecified atrial fibrillation: Secondary | ICD-10-CM | POA: Diagnosis not present

## 2021-08-16 DIAGNOSIS — I130 Hypertensive heart and chronic kidney disease with heart failure and stage 1 through stage 4 chronic kidney disease, or unspecified chronic kidney disease: Secondary | ICD-10-CM | POA: Diagnosis not present

## 2021-08-16 DIAGNOSIS — I5189 Other ill-defined heart diseases: Secondary | ICD-10-CM | POA: Diagnosis not present

## 2021-08-16 DIAGNOSIS — F0390 Unspecified dementia without behavioral disturbance: Secondary | ICD-10-CM | POA: Diagnosis not present

## 2021-08-16 DIAGNOSIS — M2550 Pain in unspecified joint: Secondary | ICD-10-CM | POA: Diagnosis not present

## 2021-08-16 LAB — CBC WITH DIFFERENTIAL/PLATELET
Abs Immature Granulocytes: 0.05 10*3/uL (ref 0.00–0.07)
Basophils Absolute: 0 10*3/uL (ref 0.0–0.1)
Basophils Relative: 0 %
Eosinophils Absolute: 0.2 10*3/uL (ref 0.0–0.5)
Eosinophils Relative: 3 %
HCT: 38 % (ref 36.0–46.0)
Hemoglobin: 12.2 g/dL (ref 12.0–15.0)
Immature Granulocytes: 1 %
Lymphocytes Relative: 11 %
Lymphs Abs: 0.8 10*3/uL (ref 0.7–4.0)
MCH: 30.7 pg (ref 26.0–34.0)
MCHC: 32.1 g/dL (ref 30.0–36.0)
MCV: 95.7 fL (ref 80.0–100.0)
Monocytes Absolute: 0.6 10*3/uL (ref 0.1–1.0)
Monocytes Relative: 9 %
Neutro Abs: 5.3 10*3/uL (ref 1.7–7.7)
Neutrophils Relative %: 76 %
Platelets: 176 10*3/uL (ref 150–400)
RBC: 3.97 MIL/uL (ref 3.87–5.11)
RDW: 15.9 % — ABNORMAL HIGH (ref 11.5–15.5)
WBC: 7 10*3/uL (ref 4.0–10.5)
nRBC: 0 % (ref 0.0–0.2)

## 2021-08-16 LAB — BASIC METABOLIC PANEL
Anion gap: 5 (ref 5–15)
BUN: 21 mg/dL (ref 8–23)
CO2: 23 mmol/L (ref 22–32)
Calcium: 8.1 mg/dL — ABNORMAL LOW (ref 8.9–10.3)
Chloride: 113 mmol/L — ABNORMAL HIGH (ref 98–111)
Creatinine, Ser: 0.94 mg/dL (ref 0.44–1.00)
GFR, Estimated: 55 mL/min — ABNORMAL LOW (ref >60)
Glucose, Bld: 84 mg/dL (ref 70–99)
Potassium: 4 mmol/L (ref 3.5–5.1)
Sodium: 141 mmol/L (ref 135–145)

## 2021-08-16 MED ORDER — METOPROLOL TARTRATE 25 MG PO TABS: 25.0000 mg | ORAL_TABLET | Freq: Three times a day (TID) | ORAL | Status: DC

## 2021-08-16 MED ORDER — METOPROLOL TARTRATE 12.5 MG HALF TABLET
12.5000 mg | ORAL_TABLET | Freq: Three times a day (TID) | ORAL | Status: DC
Start: 2021-08-16 — End: 2021-08-16

## 2021-08-16 NOTE — Telephone Encounter (Signed)
This was already addressed on 08/11/2021. ? ?ST

## 2021-08-16 NOTE — Consult Note (Signed)
? ?  [facility] CM Inpatient Consult ? ? ?08/16/2021 ? ?[name] ?[birth year] ?[id number] ? ? ?[place] [facility] [ACO] Patient: Medicare ACO REACH ? ?Primary Care Provider:  Virgie Dad, MD, patient is at [facility] which is listed to provide the [facility] follow up calls and appointments. ? ?Patient screened for hospitalization due to advanced age for potential [place] Management service needs for post hospital transition.  Review of patient's medical record reveals patient is from Assisted Living at [facility].  ? ?Met with the patient at the bedside.  Patient states she feels fair but guess the doctor said she was good enough to go back to [facility].  Patient states SDOH needs are met at [facility]. ? ? ?Plan:  No additional needs assessed. ? ?For questions contact:  ? ?Natividad Brood, RN BSN CCM ?[facility] Liaison ? [phone] business mobile phone ?Toll free office [phone]  ?Fax number: [phone] ?[email or web address] ?[email or web address]  ? ? ? ? ? ? ? ? ? ?

## 2021-08-16 NOTE — TOC Transition Note (Addendum)
Transition of Care (TOC) - CM/SW Discharge Note ? ? ?Patient Details  ?Name: Bianca [name] ?MRN: [id number] ?Date of Birth: 23-Dec-1922 ? ?Transition of Care (TOC) CM/SW Contact:  ?Reece Agar, LCSWA ?Phone Number: ?08/16/2021, 1:27 PM ? ? ?Clinical Narrative:    ?Per MD patient ready for DC to [facility] [phone]). RN, patient, patient's family, and facility notified of DC. Discharge Summary and FL2 sent to facility. DC packet on chart. Insurance Josem Kaufmann has been received.  Ambulance transport requested for patient.  ?  ?RN to call report to  ? ?CSW will sign off for now as social work intervention is no longer needed. Please consult [place] again if new needs arise. ? ? ?  ?  ? ? ?Patient Goals and CMS Choice ?  ?  ?  ? ?Discharge Placement ?  ?           ?  ?  ?  ?  ? ?Discharge Plan and Services ?  ?  ?           ?  ?  ?  ?  ?  ?  ?  ?  ?  ?  ? ?Social Determinants of Health (SDOH) Interventions ?  ? ? ?Readmission Risk Interventions ? ?  01/11/2020  ? 10:45 AM  ?Readmission Risk Prevention Plan  ?Transportation Screening Complete  ?PCP or Specialist Appt within 5-7 Days Complete  ?Home Care Screening Complete  ?Medication Review (RN CM) Referral to Pharmacy  ? ? ? ? ? ?

## 2021-08-16 NOTE — Progress Notes (Signed)
Mobility Specialist Progress Note: ? ? 08/16/21 1010  ?Mobility  ?Activity Ambulated with assistance to bathroom  ?Level of Assistance Minimal assist, patient does 75% or more  ?Assistive Device Front wheel walker  ?Distance Ambulated (ft) 30 ft  ?Activity Response Tolerated well  ?$Mobility charge 1 Mobility  ? ?Pt requesting to go to BR. Required minA to stand from EOB, MinG to ambulate to BR with RW. Pt with small BM, back in bed with bed alarm on.  ? ?Bianca [name] ?Acute Rehab ?Phone: [phone] ?Office Phone: [phone] ? ?

## 2021-08-16 NOTE — Discharge Summary (Addendum)
? ?Physician Discharge Summary  ?Bianca [name] HTX:[id number] DOB: 1922-11-05 DOA: 08/13/2021 ? ?PCP: Virgie Dad, MD ? ?Admit date: 08/13/2021 ?Discharge date: 08/16/2021 ? ?Admitted From: ALF ?Disposition:  ALF ? ?Recommendations for Outpatient Follow-up:  ?Follow up with Dr Terri Skains in 1-2 weeks ?Recommend palliative care outpatient follow up ? ?Home Health: PT ?Equipment/Devices: none ? ?Discharge Condition: stable ?CODE STATUS: DNR ?Diet Orders (From admission, onward)  ? ?  Start     Ordered  ? 08/13/21 1401  Diet Heart Room service appropriate? Yes; Fluid consistency: Thin; Fluid restriction: 1800 mL Fluid  Diet effective now       ?Question Answer Comment  ?Room service appropriate? Yes   ?Fluid consistency: Thin   ?Fluid restriction: 1800 mL Fluid   ?  ? 08/13/21 1401  ? ?  ?  ? ?  ? ? ?HPI: Per admitting MD, ?Bianca [name] is a 88 y.o. female with medical history significant of PAF on Eliquis, PSVT refractory, chronic diastolic CHF, stroke, macular degeneration, came with worsening of palpitations and shortness of breath. Patient has chronic PAF versus PSVT, in the past, has tried multiple rate control medications, failed amiodarone because of prolonged QTc, who has had uncontrolled palpitations lately since late April.  She has been following with cardiologist who started patient on Pindolol in addition to Metoprolol. Last several days, patient gets.  Increasing palpitations and new onset of shortness of breath, today, even at rest, patient feels significant shortness of breath.  No cough no wheezing no chest pains. ? ?Hospital Course / Discharge diagnoses: ?Principal Problem: ?  SVT (supraventricular tachycardia) ([place]) ?Active Problems: ?  CKD (chronic kidney disease) stage 3, GFR 30-59 ml/min (HCC) ?  Tachycardia ?  Diastolic dysfunction ? ?Principal problem ?PSVT versus a flutter-cardiology consulted, appreciate follow-up.  Her beta-blocker dose was increased with improvement in her SVT.  Recommended  to avoid digoxin, apparently she could not tolerate Cardizem due to hypotension.  She was on amiodarone in the past but this was discontinued due to prolonged QT ? ?Active problem ?Chronic diastolic CHF-no evidence of fluid overload, chest x-ray without evidence of congestion.  Continue low-sodium, heart healthy diet ?CKD stage 3a, chronic metabolic acidosis-creatinine is at baseline ?Hyperlipidemia-continue statin ? ?Sepsis ruled out ? ? ?Discharge Instructions ? ? ?Allergies as of 08/16/2021   ? ?   Reactions  ? Alphagan P [brimonidine Tartrate]   ? Unknown reaction ?Listed on MAR  ? Azopt [brinzolamide] Other (See Comments)  ? Unknown reaction ?Listed on MAR  ? Lumigan [bimatoprost] Other (See Comments)  ? Unknown reaction ?Listed on MAR  ? ?  ? ?  ?Medication List  ?  ? ?TAKE these medications   ? ?acetaminophen 325 MG tablet ?Commonly known as: TYLENOL ?Take 650 mg by mouth every 6 (six) hours as needed. ?  ?apixaban 2.5 MG Tabs tablet ?Commonly known as: ELIQUIS ?Take 1 tablet (2.5 mg total) by mouth 2 (two) times daily. ?  ?atorvastatin 10 MG tablet ?Commonly known as: LIPITOR ?Take 5 mg by mouth daily. 1/2 a tablet=5 mg ?  ?B COMPLEX VITAMINS (W/ FA) PO ?Take 1 tablet by mouth daily. Vitamin B complex - Folic Acid 0.4 mg ?  ?bismuth subsalicylate 320 EB/34DH suspension ?Commonly known as: PEPTO BISMOL ?Take 60 mLs by mouth daily as needed for indigestion or diarrhea or loose stools. ?  ?Calcium Carbonate-Vitamin D 600-400 MG-UNIT tablet ?Take 1 tablet by mouth daily. ?  ?feeding supplement (BOOST BREEZE) Liqd ?Take 1  Bottle by mouth daily. ?  ?magic mouthwash Soln ?Take 5 mLs by mouth 4 (four) times daily. Swish and spit. ?  ?melatonin 3 MG Tabs tablet ?Take 3 mg by mouth daily at 8 pm. ?  ?metoprolol tartrate 25 MG tablet ?Commonly known as: LOPRESSOR ?Take 1 tablet (25 mg total) by mouth in the morning, at noon, and at bedtime. ?What changed:  ?when to take this ?additional instructions ?  ?omeprazole 20 MG  capsule ?Commonly known as: PRILOSEC ?Take 20 mg by mouth daily. ?  ?PreviDent 5000 Booster Plus 1.1 % Pste ?Generic drug: Sodium Fluoride ?Place 1 application. onto teeth daily at 8 pm. ?  ?Air traffic controller ?Take 1 tablet by mouth daily. ?  ?timolol 0.5 % ophthalmic solution ?Commonly known as: TIMOPTIC ?Place 1 drop into both eyes daily. ?  ?triamcinolone ointment 0.1 % ?Commonly known as: KENALOG ?Apply 1 application. topically 2 (two) times daily. ?  ? ?  ? ? Follow-up Information   ? ? Tolia, Sunit, DO Follow up on 08/31/2021.   ?Specialties: Cardiology, Vascular Surgery ?Why: 3:30 status post hospitalization. ?Contact information: ?[address] ?[place] [address] ?[phone] ? ? ?  ?  ? ?  ?  ? ?  ? ? ?Consultations: ?Cardiology  ?EP ? ?Procedures/Studies: ? ?CT CHEST WO CONTRAST ? ?Result Date: 08/13/2021 ?CLINICAL DATA:  Pneumonia suspected on chest x-ray. EXAM: CT CHEST WITHOUT CONTRAST TECHNIQUE: Multidetector CT imaging of the chest was performed following the standard protocol without IV contrast. RADIATION DOSE REDUCTION: This exam was performed according to the departmental dose-optimization program which includes automated exposure control, adjustment of the mA and/or kV according to patient size and/or use of iterative reconstruction technique. COMPARISON:  Aug 13, 2020 chest x-ray FINDINGS: Cardiovascular: Mild calcified atherosclerotic change in the nonaneurysmal aorta. The central pulmonary arteries are normal in caliber. Calcified atherosclerotic changes are identified in the left coronary arteries. Cardiomegaly. Mediastinum/Nodes: The thyroid and esophagus are normal. Bilateral pleural effusions are relatively small. A small pericardial effusion is identified. Shotty nodes in the mediastinum are likely reactive without gross adenopathy. Chest wall is normal. Lungs/Pleura: Central airways are normal. No pneumothorax. Mild atelectasis is associated with the bilateral pleural  effusions. No infiltrate identified to suggest pneumonia or aspiration. No overt edema. No mass or suspicious nodule. Upper Abdomen: There is a 4.4 cm cyst in the left hepatic lobe. Musculoskeletal: No chest wall mass or suspicious bone lesions identified. IMPRESSION: 1. Small bilateral pleural effusions with underlying atelectasis. 2. Calcified atherosclerotic changes in the nonaneurysmal aorta in the left coronary arteries. 3. Cardiomegaly. 4. Small pericardial effusion. 5. No other significant abnormalities. Aortic Atherosclerosis (ICD10-I70.0). Electronically Signed   By: Dorise Bullion III M.D.   On: 08/13/2021 14:50  ? ?DG Chest Port 1 View ? ?Result Date: 08/13/2021 ?CLINICAL DATA:  Tachycardia EXAM: PORTABLE CHEST 1 VIEW COMPARISON:  Chest x-ray dated November 02, 2019 FINDINGS: Cardiac and mediastinal contours are unchanged. New left basilar opacity. No large pleural effusion or pneumothorax. IMPRESSION: New mild left basilar opacity, differential considerations include atelectasis or infection/aspiration. Electronically Signed   By: Yetta Glassman M.D.   On: 08/13/2021 11:42   ? ? ?Subjective: ?- no chest pain, shortness of breath, no abdominal pain, nausea or vomiting.  ? ?Discharge Exam: ?BP (!) 124/55 (BP Location: Right Arm)   Pulse (!) 58   Temp 98 ?F (36.7 ?C) (Oral)   Resp 18   Ht '4\' 10"'$  (1.473 m)   Wt  52.1 kg   SpO2 91%   BMI 24.01 kg/m?  ? ?General: Pt is alert, awake, not in acute distress ?Cardiovascular: RRR, S1/S2 +, no rubs, no gallops ?Respiratory: CTA bilaterally, no wheezing, no rhonchi ?Abdominal: Soft, NT, ND, bowel sounds + ?Extremities: no edema, no cyanosis ? ? ?The results of significant diagnostics from this hospitalization (including imaging, microbiology, ancillary and laboratory) are listed below for reference.   ? ? ?Microbiology: ?No results found for this or any previous visit (from the past 240 hour(s)).  ? ?Labs: ?Basic Metabolic Panel: ?Recent Labs  ?Lab 08/13/21 ?1027  08/13/21 ?1336 08/13/21 ?1420 08/14/21 ?0336 08/15/21 ?0353 08/16/21 ?0421  ?NA 140  --  142 141 142 141  ?K 4.9  --  4.8 4.5 3.9 4.0  ?CL 111  --   --  114* 114* 113*  ?CO2 17*  --   --  20* 21* 23  ?GLUCOSE 206

## 2021-08-16 NOTE — Progress Notes (Signed)
Progress Note ? ?Patient Name: Bianca [name] ?Date of Encounter: 08/16/2021 ? ?Attending physician: Caren Griffins, MD ?Primary care provider: Virgie Dad, MD ?Primary Cardiologist: Rex Kras, DO, [facility] ? ?Subjective: ?Bianca [name] is a 88 y.o. female who was seen and examined at bedside  ?No events overnight. ?Asymptomatic. ?Patient states " I remember you from yesterday." ?Plan of care discussed with nursing staff. ? ?Objective: ?Vital Signs in the last 24 hours: ?Temp:  [97.8 ?F (36.6 ?C)-98.2 ?F (36.8 ?C)] 98 ?F (36.7 ?C) (05/10 0724) ?Pulse Rate:  [56-139] 58 (05/10 0724) ?Resp:  [15-20] 18 (05/10 0724) ?BP: (117-142)/(55-92) 124/55 (05/10 0724) ?SpO2:  [91 %-98 %] 91 % (05/10 0724) ?Weight:  [52.1 kg] 52.1 kg (05/10 0500) ? ?Intake/Output: ? ?Intake/Output Summary (Last 24 hours) at 08/16/2021 1058 ?Last data filed at 08/16/2021 5427 ?Gross per 24 hour  ?Intake 477 ml  ?Output 200 ml  ?Net 277 ml  ?  ?Net IO Since Admission: 707 mL [08/16/21 1058] ? ?Weights:  ?Filed Weights  ? 08/15/21 0454 08/16/21 0004 08/16/21 0500  ?Weight: 52.8 kg 52.1 kg 52.1 kg  ? ? ?Telemetry: Personally reviewed.  Predominately normal sinus with rare episodes of tachycardia. ? ?Physical examination: ?PHYSICAL EXAM: ? ?  08/16/2021  ?  7:24 AM 08/16/2021  ?  5:00 AM 08/16/2021  ?  3:00 AM  ?Vitals with BMI  ?Weight  114 lbs 14 oz   ?BMI  24.01   ?Systolic 062  376  ?Diastolic 55  63  ?Pulse 58  59  ? ? ?General: No acute distress, hemodynamically stable, resting comfortably ?HEENT: Normocephalic, atraumatic, no scleral icterus or xanthelasmas, no JVP, trachea midline ?Lungs: Clear to auscultation bilaterally.  No wheezes rales or rhonchi's. ?Heart: Regular, positive S1-S2, no murmurs rubs or gallops appreciated. ?Abdomen: Soft, nontender, nondistended, positive bowel sounds in all 4 quadrants ?Extremities: No pitting edema, warm to touch ?Neuro: Pleasantly demented, alert oriented x1, baseline dementia ?Psych:  Cooperative. ?No change in physical examination since yesterday. ? ?Lab Results: ?Hematology ?Recent Labs  ?Lab 08/13/21 ?1027 08/13/21 ?1420 08/16/21 ?0421  ?WBC 8.5  --  7.0  ?RBC 4.72  --  3.97  ?HGB 14.3 15.3* 12.2  ?HCT 46.1* 45.0 38.0  ?MCV 97.7  --  95.7  ?MCH 30.3  --  30.7  ?MCHC 31.0  --  32.1  ?RDW 16.1*  --  15.9*  ?PLT 264  --  176  ? ? ?Chemistry ?Recent Labs  ?Lab 08/13/21 ?1027 08/13/21 ?1420 08/14/21 ?0336 08/15/21 ?0353 08/16/21 ?0421  ?NA 140   < > 141 142 141  ?K 4.9   < > 4.5 3.9 4.0  ?CL 111  --  114* 114* 113*  ?CO2 17*  --  20* 21* 23  ?GLUCOSE 206*  --  97 82 84  ?BUN 34*  --  34* 29* 21  ?CREATININE 1.32*  --  1.22* 1.12* 0.94  ?CALCIUM 8.6*  --  8.3* 8.0* 8.1*  ?PROT 6.0*  --   --   --   --   ?ALBUMIN 3.5  --   --   --   --   ?AST 37  --   --   --   --   ?ALT 25  --   --   --   --   ?ALKPHOS 80  --   --   --   --   ?BILITOT 1.0  --   --   --   --   ?  GFRNONAA 37*  --  40* 45* 55*  ?ANIONGAP 12  --  '7 7 5  '$ ? < > = values in this interval not displayed.  ?  ? ?Cardiac Enzymes: ?Cardiac Panel (last 3 results) ?Recent Labs  ?  08/13/21 ?1316  ?TROPONINIHS 15  ? ? ?BNP (last 3 results) ?No results for input(s): BNP in the last 8760 hours. ? ?ProBNP (last 3 results) ?No results for input(s): PROBNP in the last 8760 hours. ? ? ?DDimer No results for input(s): DDIMER in the last 168 hours.  ? ?Hemoglobin A1c:  ?Lab Results  ?Component Value Date  ? HGBA1C 5.9 (H) 11/04/2019  ? MPG 122.63 11/04/2019  ? ? ?TSH  ?Recent Labs  ?  08/13/21 ?1336  ?TSH 5.842*  ? ? ?Lipid Panel  ?   ?Component Value Date/Time  ? CHOL 78 06/08/2021 0000  ? TRIG 49 06/08/2021 0000  ? HDL 46 06/08/2021 0000  ? CHOLHDL 2.8 11/06/2019 0544  ? VLDL 17 11/06/2019 0544  ? [place] 18 06/08/2021 0000  ? ? ?Imaging: ?No results found. ? ?CARDIAC DATABASE: ?EKG: ?02/01/2021: Normal sinus rhythm, 60 bpm, left axis deviation, poor R wave progression, without underlying injury pattern.  QT 424 ms. ?  ?08/02/2021: Junctional tachycardia  with retrograde P wave conduction, poor R wave progression.  Prior EKG notes normal sinus rhythm. ?  ?08/13/2021: Junctional tachycardia, 150 bpm, right axis deviation without underlying injury pattern. ?  ?08/14/2021: Sinus rhythm, 61 bpm, first-degree AV block, TWI high lateral and lateral leads suggestive of possible ischemia, without underlying injury pattern. ?  ?Echocardiogram: ?10/27/2019:  ?1. Left ventricular ejection fraction, by estimation, is 60 to 65%. The  left ventricle has normal function. The left ventricle has no regional  ?wall motion abnormalities. There is mild left ventricular hypertrophy.  Left ventricular diastolic parameters  ?are consistent with Grade II diastolic dysfunction (pseudonormalization).  Elevated left atrial pressure.  ? 2. Right ventricular systolic function is normal. The right ventricular  size is normal. There is mildly elevated pulmonary artery systolic  ?pressure. The estimated right ventricular systolic pressure is 75.1 mmHg.  ? 3. Right atrial size was mildly dilated.  ? 4. The mitral valve is normal in structure. Trivial mitral valve  regurgitation.  ? 5. Tricuspid valve regurgitation is moderate to severe.  ? 6. The aortic valve is tricuspid. Aortic valve regurgitation is mild.  Mild aortic valve sclerosis is present, with no evidence of aortic valve stenosis.  ? 7. The inferior vena cava is normal in size with greater than 50%  respiratory variability, suggesting right atrial pressure of 3 mmHg.  ?  ?Carotid artery duplex  11/06/2019: ?Right Carotid: Velocities in the right ICA are consistent with a 1-39%  stenosis.  ?Left Carotid: Velocities in the left ICA are consistent with a 1-39%  stenosis.  ?Vertebrals: Bilateral vertebral arteries demonstrate antegrade flow.  ? ?Scheduled Meds: ? apixaban  2.5 mg Oral BID  ? atorvastatin  5 mg Oral Daily  ? guaiFENesin  1,200 mg Oral BID  ? magic mouthwash  5 mL Oral QID  ? melatonin  3 mg Oral Q2000  ? metoprolol tartrate  12.5  mg Oral TID  ? pantoprazole  40 mg Oral Daily  ? sodium bicarbonate  650 mg Oral BID  ? sodium chloride flush  3 mL Intravenous Q12H  ? timolol  1 drop Both Eyes Daily  ? ? ?Continuous Infusions: ? sodium chloride    ? ? ?PRN Meds: ?sodium  chloride, acetaminophen, acetaminophen, levalbuterol, metoprolol tartrate, sodium chloride flush  ? ?IMPRESSION & RECOMMENDATIONS: ?DOMINIC RHOME is a 88 y.o. Caucasian female whose past medical history and cardiac risk factors include: Paroxysmal supraventricular tachycardia, paroxysmal atrial flutter/fibrillation, hyperlipidemia, grade 2 diastolic dysfunction, cognitive impairment suggestive of possible age-related dementia/memory difficulties, history of stroke, postmenopausal female, advanced age. ? 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 ?Mixed hyperlipidemia ?Diastolic dysfunction. ?History of stroke ?Dementia ? ?Plan: ?Given her history of paroxysmal atrial fibrillation patient has been on rate control strategy with metoprolol and oral anticoagulation for thromboembolic prophylaxis given her history of stroke.  Patient was on amiodarone for rhythm control but discontinued due to  prolonged QT.  At last office visit please [facility] metoprolol was being held due to soft blood pressures despite being tachycardic.  EKG concerning for junctional tachycardia and her Lopressor was transitioned to pindolol. ? ?Since she did not respond well to pindolol her she was transitioned back to Lopressor but over the weekend she presented to the hospital with SVT and noted to be in A-fib with RVR during initial 24 hours of hospitalization.  She was initially loaded with digoxin by the primary team and her ventricular rate stabilized and she restored sinus rhythm. ? ?Given her advanced age, dementia, poor oral intake, and frailty long-term  digoxin felt not to be a deal as it would predispose her to dig toxicity.  I discontinued digoxin for the same reasons and consulted electrophysiology for further guidance given her rhythm disturbance and limited pha

## 2021-08-16 NOTE — Progress Notes (Signed)
?Location:  [facility] [place] ?Nursing Home Room Number: N040/A ?Place of Service:  SNF (31) ?Provider: Yvonna Alanis, NP ? ?Patient Care Team: ?Virgie Dad, MD as PCP - General (Internal Medicine) ? ?Extended Emergency Contact Information ?Primary Emergency Contact: [name], [place] ?Mobile Phone: [phone] ?Relation: Nephew ?Secondary Emergency Contact: [name] ?Mobile Phone: [phone] ?Relation: Friend ? ?Code Status:  DNR ?Goals of care: Advanced Directive information ? ?  08/16/2021  ?  3:44 PM  ?Advanced Directives  ?Does Patient Have a Medical Advance Directive? Yes  ?Type of Advance Directive Living will;Healthcare Power of Attorney  ?Does patient want to make changes to medical advance directive? No - Patient declined  ?Copy of [place] in Chart? Yes - validated most recent copy scanned in chart (See row information)  ? ? ? ?Chief Complaint  ?Patient presents with  ? Hospitalization Follow-up  ?  Hospital follow up.   ? Quality Metric Gaps  ?  Discuss the need for TDAP, Pneuomonia, and Shingrix vaccine or post pone if patient refuses.   ? ? ?HPI:  ?Pt is a 88 y.o. female seen today s/p f/u hospitalization 05/07-05/10.  ? ?Cardiac history of PAF- amiodarone intolerance due to prolonged QT/falls, HLD, grade 2 diastolic dysfuntion, h/o stroke. 04/26 she was diagnosed with junctional tachycardia by cardiologist and started on pindolol. 05/06 nursing reported  HR in 150's. After talking with cardiology, pindolol was discontinued and she was restarted on her lopressor. 05/07 she was hospitalized due to elevated HR. Diagnosed with PSVT versus flutter. TSH 0.0867 08/13/2021. Lopressor was increased to 25 mg TID. She was advised to avoid digoxin, unable to tolerate Cardizem due to hypotension. Discharged to [facility]. Palliative consult recommended.  ? ?Today, she reports feeling tired. She is a poor historian due to dementia. She cannot recall much of her  hospitalization. Denies chest pain and sob today.  ? ?Dementia- no behavioral outbursts, lived in [place] prior to hospitalization, ambulates with walker ?PAF- see above, remains on low dose Eliquis for clot prevention ?CKD- BUN/creat 21/0.94 08/16/2021 ?GERD- hgb 12.2 08/16/2021, remains on Omeprazole ? ? ?Past Medical History:  ?Diagnosis Date  ? Atrial flutter, paroxysmal ([place])   ? COVID-19   ? Dementia ([place])   ? Diastolic dysfunction   ? Hyperlipidemia   ? Hypertension   ? PSVT (paroxysmal supraventricular tachycardia) ([place])   ? Stroke [facility])   ? 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 ?History reviewed. No pertinent surgical history. ? ?Allergies  ?Allergen Reactions  ? Alphagan P [Brimonidine Tartrate]   ?  Unknown reaction ?Listed on MAR  ? Azopt [Brinzolamide] Other (See Comments)  ?  Unknown reaction ?Listed on MAR  ? Lumigan [Bimatoprost] Other (See Comments)  ?  Unknown reaction ?Listed on MAR  ? ? ?Outpatient Encounter Medications as of 08/16/2021  ?Medication Sig  ? acetaminophen (TYLENOL) 325 MG tablet Take 650 mg by mouth every 6 (six) hours as needed.  ? apixaban (ELIQUIS) 2.5 MG TABS tablet Take 1 tablet (2.5 mg total) by mouth 2 (two) times daily.  ? atorvastatin (LIPITOR) 10 MG tablet Take 5 mg by mouth daily. 1/2 a tablet=5 mg  ? B Complex-Folic Acid (B COMPLEX VITAMINS, W/ FA, PO) Take 1 tablet by mouth daily. Vitamin B complex - Folic Acid 0.4 mg  ? bismuth subsalicylate (PEPTO BISMOL) 262 MG/15ML suspension Take 60 mLs by mouth daily as needed for indigestion or diarrhea  or loose stools.   ? Calcium Carbonate-Vitamin D 600-400 MG-UNIT tablet Take 1 tablet by mouth daily.  ? melatonin 3 MG TABS tablet Take 3 mg by mouth daily at 8 pm.  ? metoprolol tartrate (LOPRESSOR) 25 MG tablet Take 1 tablet (25 mg total) by mouth in the morning, at noon, and at bedtime.  ? Nutritional Supplements (FEEDING SUPPLEMENT, BOOST BREEZE,) LIQD Take 1 Bottle by  mouth daily.  ? omeprazole (PRILOSEC) 20 MG capsule Take 20 mg by mouth daily.   ? Sodium Fluoride (PREVIDENT 5000 BOOSTER PLUS) 1.1 % PSTE Place 1 application. onto teeth daily at 8 pm.  ? timolol (TIMOPTIC) 0.5 % ophthalmic solution Place 1 drop into both eyes daily.  ? triamcinolone ointment (KENALOG) 0.1 % Apply 1 application. topically 2 (two) times daily.  ? [DISCONTINUED] magic mouthwash SOLN Take 5 mLs by mouth 4 (four) times daily. Swish and spit.  ? [DISCONTINUED] Multiple Vitamins-Minerals (SENTRY SENIOR) TABS Take 1 tablet by mouth daily.  ? ?Facility-Administered Encounter Medications as of 08/16/2021  ?Medication  ? 0.9 %  sodium chloride infusion  ? acetaminophen (TYLENOL) tablet 650 mg  ? acetaminophen (TYLENOL) tablet 650 mg  ? apixaban (ELIQUIS) tablet 2.5 mg  ? atorvastatin (LIPITOR) tablet 5 mg  ? guaiFENesin (MUCINEX) 12 hr tablet 1,200 mg  ? levalbuterol (XOPENEX HFA) inhaler 2 puff  ? magic mouthwash  ? melatonin tablet 3 mg  ? metoprolol tartrate (LOPRESSOR) injection 2.5 mg  ? metoprolol tartrate (LOPRESSOR) tablet 12.5 mg  ? pantoprazole (PROTONIX) EC tablet 40 mg  ? sodium bicarbonate tablet 650 mg  ? sodium chloride flush (NS) 0.9 % injection 3 mL  ? sodium chloride flush (NS) 0.9 % injection 3 mL  ? timolol (TIMOPTIC) 0.5 % ophthalmic solution 1 drop  ? ? ?Review of Systems  ?Unable to perform ROS: Dementia  ? ?Immunization History  ?Administered Date(s) Administered  ? DTaP 09/05/2004  ? Influenza, High Dose Seasonal PF 01/06/2014, 11/08/2018  ? Influenza, Quadrivalent, Recombinant, Inj, Pf 01/09/2018, 01/21/2019  ? Influenza-Unspecified 11/07/2013, 01/14/2020, 01/26/2021  ? Moderna Covid-19 Vaccine Bivalent Booster 50yr & up 04/04/2021  ? Moderna SARS-COV2 Booster Vaccination 02/22/2020, 09/06/2020  ? Moderna Sars-Covid-2 Vaccination 05/09/2019, 06/06/2019  ? Pneumococcal Conjugate-13 01/06/2014  ? Pneumococcal-Unspecified 09/05/2004, 10/18/2014  ? Zoster Recombinat (Shingrix)  05/24/2021  ? ?Pertinent  Health Maintenance Due  ?Topic Date Due  ? INFLUENZA VACCINE  11/07/2021  ? DEXA SCAN  Completed  ? ? ?  08/13/2021  ?  5:00 PM 08/14/2021  ? 11:00 AM 08/14/2021  ?  8:45 PM 08/15/2021  ? 10:00 AM 08/15/2021  ?  9:00 PM  ?Fall Risk  ?Patient Fall Risk Level High fall risk High fall risk Moderate fall risk Moderate fall risk High fall risk  ? ?Functional Status Survey: ?  ? ?Vitals:  ? 08/16/21 1539  ?BP: 110/60  ?Pulse: 82  ?Resp: 20  ?Temp: 98 ?F (36.7 ?C)  ?SpO2: 96%  ?Weight: 79 lb 8 oz (36.1 kg)  ?Height: 5' (1.524 m)  ? ?Body mass index is 15.53 kg/m?.Marland Kitchen?Physical Exam ?Vitals reviewed.  ?Constitutional:   ?   General: She is not in acute distress. ?HENT:  ?   Head: Normocephalic.  ?Eyes:  ?   General:     ?   Right eye: No discharge.     ?   Left eye: No discharge.  ?Cardiovascular:  ?   Rate and Rhythm: Normal rate and regular rhythm.  ?  Pulses: Normal pulses.  ?   Heart sounds: Normal heart sounds.  ?Pulmonary:  ?   Effort: Pulmonary effort is normal.  ?   Breath sounds: Normal breath sounds.  ?Abdominal:  ?   General: Abdomen is flat. Bowel sounds are normal. There is no distension.  ?   Palpations: Abdomen is soft.  ?   Tenderness: There is no abdominal tenderness.  ?Musculoskeletal:  ?   Cervical back: Neck supple.  ?   Right lower leg: No edema.  ?   Left lower leg: No edema.  ?Skin: ?   General: Skin is warm and dry.  ?   Capillary Refill: Capillary refill takes less than 2 seconds.  ?Neurological:  ?   General: No focal deficit present.  ?   Mental Status: She is alert. Mental status is at baseline.  ?   Motor: Weakness present.  ?   Gait: Gait abnormal.  ?Psychiatric:     ?   Mood and Affect: Mood normal.     ?   Behavior: Behavior normal.  ? ? ?Labs reviewed: ?Recent Labs  ?  08/13/21 ?1336 08/13/21 ?1420 08/14/21 ?0336 08/15/21 ?0353 08/16/21 ?0421  ?NA  --    < > 141 142 141  ?K  --    < > 4.5 3.9 4.0  ?CL  --   --  114* 114* 113*  ?CO2  --   --  20* 21* 23  ?GLUCOSE  --   --  97 82  84  ?BUN  --   --  34* 29* 21  ?CREATININE  --   --  1.22* 1.12* 0.94  ?CALCIUM  --   --  8.3* 8.0* 8.1*  ?MG 2.3  --   --   --   --   ? < > = values in this interval not displayed.  ? ?Recent Labs  ?  01/19/21 ?000

## 2021-08-16 NOTE — NC FL2 (Addendum)
?[place] MEDICAID FL2 LEVEL OF CARE SCREENING TOOL  ?  ? ?IDENTIFICATION  ?Patient Name: ?Bianca [name] Birthdate: 04-10-1922 Sex: female Admission Date (Current Location): ?08/13/2021  ?[place] and [place] Number: ? [place] ?  Facility and Address:  ?[facility] [employer], [place] [address], [place], [place] [address] ?     Provider Number: ?[id number]  ?Attending Physician Name and Address:  ?Caren Griffins, MD ? Relative Name and Phone Number:  ?[name] [facility])   [phone] ?   ?Current Level of Care: ?ALF Recommended Level of Care: ?SNF Prior Approval Number: ?  ? ?Date Approved/Denied: ?  PASRR Number: ?[id number] H ? ?Discharge Plan: ?SNF ?  ? ?Current Diagnoses: ?Patient Active Problem List  ? Diagnosis Date Noted  ? SVT (supraventricular tachycardia) ([place]) 08/13/2021  ? Soreness of tongue 08/03/2021  ? Delayed wound healing 02/17/2021  ? Cellulitis of right anterior lower leg 01/26/2021  ? Chest wall pain 01/19/2021  ? Left knee pain 01/11/2021  ? Weight gain 07/06/2020  ? Anemia, iron deficiency 01/22/2020  ? Syncope   ? Prolonged QT interval   ? Paroxysmal atrial flutter ([place])   ? Long term current use of anticoagulant   ? Mixed hyperlipidemia   ? History of stroke   ? Diastolic dysfunction   ? Fall 01/09/2020  ? Depression, recurrent ([place]) 11/07/2019  ? Stroke ([place]) 11/05/2019  ? Tachycardia 11/03/2019  ? Hypotension 11/03/2019  ? Sinus bradycardia 11/02/2019  ? Acute lower UTI 11/02/2019  ? Vascular dementia ([place]) 10/26/2019  ? CKD (chronic kidney disease) stage 3, GFR 30-59 ml/min (HCC) 09/30/2019  ? Edema 06/11/2019  ? Metatarsalgia 06/11/2019  ? GERD (gastroesophageal reflux disease) 05/14/2019  ? Paroxysmal SVT (supraventricular tachycardia) ([place]) 04/27/2019  ? Pneumonia due to COVID-19 virus 04/27/2019  ? Chronic diarrhea 04/27/2019  ? Urinary frequency 04/27/2019  ? Protein-calorie malnutrition ([place]) 04/27/2019  ? Emphysema of lung ([place]) 04/27/2019  ? ? ?Orientation  RESPIRATION BLADDER Height & Weight   ?  ?Self, Time, Situation, Place ? Normal Continent Weight: 114 lb 13.8 oz (52.1 kg) ?Height:  '4\' 10"'$  (147.3 cm)  ?BEHAVIORAL SYMPTOMS/MOOD NEUROLOGICAL BOWEL NUTRITION STATUS  ?    Continent Diet (See Summary)  ?AMBULATORY STATUS COMMUNICATION OF NEEDS Skin   ?Limited Assist Verbally Normal ?  ?  ?  ?    ?     ?     ? ? ?Personal Care Assistance Level of Assistance  ?Bathing, Feeding, Dressing Bathing Assistance: Limited assistance ?Feeding assistance: Independent ?Dressing Assistance: Limited assistance ?   ? ?Functional Limitations Info  ?Sight, Hearing, Speech Sight Info: Adequate ?Hearing Info: Impaired ?Speech Info: Adequate  ? ? ?SPECIAL CARE FACTORS FREQUENCY  ?PT (By licensed PT), OT (By licensed OT)   ?  ?PT Frequency: 3x a week ?OT Frequency: 3x a week ?  ?  ?  ?   ? ? ?Contractures Contractures Info: Not present  ? ? ?Additional Factors Info  ?Code Status, Allergies Code Status Info: DNR ?Allergies Info: Alphagan P (Brimonidine Tartrate)   Azopt (Brinzolamide)   Lumigan (Bimatoprost) ?  ?  ?  ?   ? ?Current Medications (08/16/2021):  This is the current hospital active medication list ?Current Facility-Administered Medications  ?Medication Dose Route Frequency Provider Last Rate Last Admin  ? 0.9 %  sodium chloride infusion  250 mL Intravenous PRN Wynetta Fines T, MD      ? acetaminophen (TYLENOL) tablet 650 mg  650 mg Oral Q6H PRN  Lequita Halt, MD      ? acetaminophen (TYLENOL) tablet 650 mg  650 mg Oral Q4H PRN Wynetta Fines T, MD      ? apixaban Arne [place]) tablet 2.5 mg  2.5 mg Oral BID Wynetta Fines T, MD   2.5 mg at 08/16/21 6160  ? atorvastatin (LIPITOR) tablet 5 mg  5 mg Oral Daily Wynetta Fines T, MD   5 mg at 08/16/21 7371  ? guaiFENesin (MUCINEX) 12 hr tablet 1,200 mg  1,200 mg Oral BID Wynetta Fines T, MD   1,200 mg at 08/16/21 0626  ? levalbuterol (XOPENEX HFA) inhaler 2 puff  2 puff Inhalation Q6H PRN Shalhoub, Sherryll Burger, MD      ? magic mouthwash  5 mL Oral QID  Wynetta Fines T, MD   5 mL at 08/15/21 2217  ? melatonin tablet 3 mg  3 mg Oral Q2000 Lequita Halt, MD   3 mg at 08/15/21 2216  ? metoprolol tartrate (LOPRESSOR) injection 2.5 mg  2.5 mg Intravenous Q6H PRN Wynetta Fines T, MD   2.5 mg at 08/15/21 0920  ? metoprolol tartrate (LOPRESSOR) tablet 12.5 mg  12.5 mg Oral TID Tolia, Sunit, DO      ? pantoprazole (PROTONIX) EC tablet 40 mg  40 mg Oral Daily Wynetta Fines T, MD   40 mg at 08/16/21 9485  ? sodium bicarbonate tablet 650 mg  650 mg Oral BID Lequita Halt, MD   650 mg at 08/16/21 4627  ? sodium chloride flush (NS) 0.9 % injection 3 mL  3 mL Intravenous Q12H Wynetta Fines T, MD   3 mL at 08/15/21 2217  ? sodium chloride flush (NS) 0.9 % injection 3 mL  3 mL Intravenous PRN Wynetta Fines T, MD   3 mL at 08/13/21 1406  ? timolol (TIMOPTIC) 0.5 % ophthalmic solution 1 drop  1 drop Both Eyes Daily Dahal, Marlowe Aschoff, MD   1 drop at 08/15/21 0836  ? ? ? ?Discharge Medications: ?Please see discharge summary for a list of discharge medications. ? ?Relevant Imaging Results: ? ?Relevant Lab Results: ? ? ?Additional Information ?SS#[id number]; Moderna COVID-19 Vaccine 06/06/2019 , 05/09/2019  Moderna Covid-19 Booster Vaccine 09/06/2020 , 02/22/2020  Moderna Covid-19 vaccine Bivalent Booster 04/04/2021 ? ?Tobiah Celestine Renold Don, LCSWA ? ? ? ? ?

## 2021-08-17 ENCOUNTER — Encounter: Payer: Self-pay | Admitting: Internal Medicine

## 2021-08-17 ENCOUNTER — Non-Acute Institutional Stay (SKILLED_NURSING_FACILITY): Payer: Medicare Other | Admitting: Internal Medicine

## 2021-08-17 ENCOUNTER — Telehealth: Payer: Self-pay

## 2021-08-17 DIAGNOSIS — I471 Supraventricular tachycardia, unspecified: Secondary | ICD-10-CM

## 2021-08-17 DIAGNOSIS — I4892 Unspecified atrial flutter: Secondary | ICD-10-CM | POA: Diagnosis not present

## 2021-08-17 DIAGNOSIS — F01B0 Vascular dementia, moderate, without behavioral disturbance, psychotic disturbance, mood disturbance, and anxiety: Secondary | ICD-10-CM | POA: Diagnosis not present

## 2021-08-17 DIAGNOSIS — E782 Mixed hyperlipidemia: Secondary | ICD-10-CM | POA: Diagnosis not present

## 2021-08-17 DIAGNOSIS — I639 Cerebral infarction, unspecified: Secondary | ICD-10-CM

## 2021-08-17 DIAGNOSIS — F339 Major depressive disorder, recurrent, unspecified: Secondary | ICD-10-CM

## 2021-08-17 DIAGNOSIS — N1831 Chronic kidney disease, stage 3a: Secondary | ICD-10-CM | POA: Diagnosis not present

## 2021-08-17 NOTE — Progress Notes (Signed)
?Provider:  Veleta Miners MD ?Location:   [facility] ?Nursing Home Room Number: 40 ?Place of Service:  SNF (31) ? ?PCP: Virgie Dad, MD ?Patient Care Team: ?Virgie Dad, MD as PCP - General (Internal Medicine) ? ?Extended Emergency Contact Information ?Primary Emergency Contact: [name], [place] ?Mobile Phone: [phone] ?Relation: Nephew ?Secondary Emergency Contact: [name] ?Mobile Phone: [phone] ?Relation: Friend ? ?Code Status: DNR ?Goals of Care: Advanced Directive information ? ?  08/17/2021  ?  3:07 PM  ?Advanced Directives  ?Does Patient Have a Medical Advance Directive? Yes  ?Type of Advance Directive Living will;Healthcare Power of Attorney  ?Does patient want to make changes to medical advance directive? No - Patient declined  ?Copy of Rosita in Chart? Yes - validated most recent copy scanned in chart (See row information)  ? ? ? ? ?Chief Complaint  ?Patient presents with  ? Readmit To SNF  ?  Re admission to SNF  ? ? ?HPI: Patient is a 88 y.o. female seen today for admission to Admit to SNF ? ?Admitted to hospital from 05/7 - 5/10 for SVT with shortness of breath ? ? 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  ?Intolerant to Amiodarone due to Prolong QT and [place] ? ?Patient's metoprolol was changed to pindolol by cardiology to see if it will help her PSVT.  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 ?Patient's is back in SNF ?Continues to work with therapy ?Complaining of weakness ?No dizziness chest pain or  shortness of breath no palpitation ? ?Past Medical History:  ?Diagnosis Date  ? Atrial flutter, paroxysmal ([place])   ? COVID-19   ? Dementia ([place])   ? Diastolic dysfunction   ? Hyperlipidemia   ? Hypertension   ? PSVT (paroxysmal supraventricular tachycardia) ([place])   ? Stroke [facility])   ? 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 ?History reviewed. No pertinent surgical history. ? reports that she has never smoked. She has never used smokeless tobacco. She reports that she does not drink alcohol and does not use drugs. ?Social History  ? ?Socioeconomic History  ? Marital status: Widowed  ?  Spouse name: Not on file  ? Number of children: 0  ? Years of education: Not on file  ? Highest education level: Not on file  ?Occupational History  ? Not on file  ?Tobacco Use  ? Smoking status: Never  ? Smokeless tobacco: Never  ?Vaping Use  ? Vaping Use: Never used  ?Substance and Sexual Activity  ? Alcohol use: Never  ? Drug use: Never  ? Sexual activity: Not on file  ?Other Topics Concern  ? Not on file  ?Social History Narrative  ? Not on file  ? ?Social Determinants of Health  ? ?Financial Resource Strain: Not on file  ?Food Insecurity: No Food Insecurity  ? Worried About Charity fundraiser in the Last Year: Never true  ? Ran Out of Food in the Last Year: Never true  ?  Transportation Needs: Not on file  ?Physical Activity: Not on file  ?Stress: Not on file  ?Social Connections: Not on file  ?Intimate Partner Violence: Not on file  ? ? ?Functional Status Survey: ?  ? ?History reviewed. No pertinent family history. ? ?Health Maintenance  ?Topic Date Due  ? TETANUS/TDAP  Never done  ? Pneumonia Vaccine 80+ Years old (2 - PPSV23 if available, else PCV20) 10/18/2015  ? Zoster Vaccines- Shingrix (2 of 2) 07/19/2021  ? INFLUENZA VACCINE  11/07/2021  ? DEXA SCAN  Completed  ? COVID-19 Vaccine  Completed  ? HPV VACCINES  Aged Out  ? ? ?Allergies  ?Allergen Reactions  ? Alphagan  P [Brimonidine Tartrate]   ?  Unknown reaction ?Listed on MAR  ? Azopt [Brinzolamide] Other (See Comments)  ?  Unknown reaction ?Listed on MAR  ? Lumigan [Bimatoprost] Other (See Comments)  ?  Unknown reaction ?Listed on MAR  ? ? ?Allergies as of 08/17/2021   ? ?   Reactions  ? Alphagan P [brimonidine Tartrate]   ? Unknown reaction ?Listed on MAR  ? Azopt [brinzolamide] Other (See Comments)  ? Unknown reaction ?Listed on MAR  ? Lumigan [bimatoprost] Other (See Comments)  ? Unknown reaction ?Listed on MAR  ? ?  ? ?  ?Medication List  ?  ? ?  ? Accurate as of Aug 17, 2021  3:07 PM. If you have any questions, ask your nurse or doctor.  ?  ?  ? ?  ? ?acetaminophen 325 MG tablet ?Commonly known as: TYLENOL ?Take 650 mg by mouth every 6 (six) hours as needed. ?  ?apixaban 2.5 MG Tabs tablet ?Commonly known as: ELIQUIS ?Take 1 tablet (2.5 mg total) by mouth 2 (two) times daily. ?  ?atorvastatin 10 MG tablet ?Commonly known as: LIPITOR ?Take 5 mg by mouth daily. 1/2 a tablet=5 mg ?  ?B COMPLEX VITAMINS (W/ FA) PO ?Take 1 tablet by mouth daily. Vitamin B complex - Folic Acid 0.4 mg ?  ?bismuth subsalicylate 222 LN/98XQ suspension ?Commonly known as: PEPTO BISMOL ?Take 60 mLs by mouth daily as needed for indigestion or diarrhea or loose stools. ?  ?Calcium Carbonate-Vitamin D 600-400 MG-UNIT tablet ?Take 1 tablet by mouth daily. ?  ?feeding supplement (BOOST BREEZE) Liqd ?Take 1 Bottle by mouth daily. ?  ?magic mouthwash Soln ?Take 5 mLs by mouth in the morning, at noon, in the evening, and at bedtime. ?  ?melatonin 3 MG Tabs tablet ?Take 3 mg by mouth daily at 8 pm. ?  ?metoprolol tartrate 25 MG tablet ?Commonly known as: LOPRESSOR ?Take 1 tablet (25 mg total) by mouth in the morning, at noon, and at bedtime. ?  ?MULTIPLE VITAMIN-FOLIC ACID PO ?Take 1 tablet by mouth daily. ?  ?omeprazole 20 MG capsule ?Commonly known as: PRILOSEC ?Take 20 mg by mouth daily. ?  ?PreviDent 5000 Booster Plus 1.1 % Pste ?Generic drug: Sodium  Fluoride ?Place 1 application. onto teeth daily at 8 pm. ?  ?timolol 0.5 % ophthalmic solution ?Commonly known as: TIMOPTIC ?Place 1 drop into both eyes daily. ?  ?triamcinolone ointment 0.1 % ?Commonly known as: KENALOG ?Apply 1 application. topically 2 (two) times daily. ?  ? ?  ? ? ?Review of Systems  ?Constitutional:  Positive for activity change. Negative for appetite change.  ?HENT: Negative.    ?Respiratory:  Negative for cough and shortness of breath.   ?Cardiovascular:  Negative for leg swelling.  ?Gastrointestinal:  Negative for constipation.  ?Genitourinary:  Negative.   ?Musculoskeletal:  Positive for gait problem. Negative for arthralgias and myalgias.  ?Skin: Negative.   ?Neurological:  Positive for weakness. Negative for dizziness.  ?Psychiatric/Behavioral:  Negative for confusion, dysphoric mood and sleep disturbance.   ? ?Vitals:  ? 08/17/21 1438  ?BP: 107/61  ?Pulse: 60  ?Resp: 17  ?Temp: (!) 97.1 ?F (36.2 ?C)  ?SpO2: 92%  ?Weight: 114 lb 11.2 oz (52 kg)  ?Height: '4\' 10"'$  (1.473 m)  ? ?Body mass index is 23.97 kg/m?Marland Kitchen ?Physical Exam ?Vitals reviewed.  ?Constitutional:   ?   Appearance: Normal appearance.  ?   Comments: Very frail  ?HENT:  ?   Head: Normocephalic.  ?   Nose: Nose normal.  ?   Mouth/Throat:  ?   Mouth: Mucous membranes are moist.  ?   Pharynx: Oropharynx is clear.  ?Eyes:  ?   Pupils: Pupils are equal, round, and reactive to light.  ?Cardiovascular:  ?   Rate and Rhythm: Normal rate and regular rhythm.  ?   Pulses: Normal pulses.  ?   Heart sounds: Normal heart sounds. No murmur heard. ?Pulmonary:  ?   Effort: Pulmonary effort is normal.  ?   Breath sounds: Normal breath sounds.  ?Abdominal:  ?   General: Abdomen is flat. Bowel sounds are normal.  ?   Palpations: Abdomen is soft.  ?Musculoskeletal:     ?   General: No swelling.  ?   Cervical back: Neck supple.  ?Skin: ?   General: Skin is warm.  ?Neurological:  ?   General: No focal deficit present.  ?   Mental Status: She is alert and  oriented to person, place, and time.  ?Psychiatric:     ?   Mood and Affect: Mood normal.     ?   Thought Content: Thought content normal.  ? ? ?Labs reviewed: ?Basic Metabolic Panel: ?Recent Labs  ?  08/13/21

## 2021-08-17 NOTE — Telephone Encounter (Signed)
Transition Care Management Unsuccessful Follow-up Telephone Call ? ?Date of discharge and from where:  08/16/2021, [facility] ? ? ?Attempts:  1st Attempt ? ?Reason for unsuccessful TCM follow-up call:  Unable to reach patient; Unable to be reached ;due to release into a skilled nursing facility. ?PT @ [facility] ? ? ? ?

## 2021-08-26 ENCOUNTER — Other Ambulatory Visit (HOSPITAL_COMMUNITY): Payer: Medicare Other

## 2021-08-26 ENCOUNTER — Other Ambulatory Visit: Payer: Self-pay

## 2021-08-26 ENCOUNTER — Emergency Department (HOSPITAL_COMMUNITY): Payer: No Typology Code available for payment source

## 2021-08-26 ENCOUNTER — Encounter (HOSPITAL_COMMUNITY): Payer: Self-pay | Admitting: Emergency Medicine

## 2021-08-26 ENCOUNTER — Observation Stay (HOSPITAL_COMMUNITY)
Admission: EM | Admit: 2021-08-26 | Discharge: 2021-08-27 | Disposition: A | Discharge status: Skilled Nursing Facility | Payer: No Typology Code available for payment source | Attending: Family Medicine | Admitting: Family Medicine

## 2021-08-26 DIAGNOSIS — Z79899 Other long term (current) drug therapy: Secondary | ICD-10-CM | POA: Diagnosis not present

## 2021-08-26 DIAGNOSIS — R0789 Other chest pain: Principal | ICD-10-CM | POA: Insufficient documentation

## 2021-08-26 DIAGNOSIS — R9431 Abnormal electrocardiogram [ECG] [EKG]: Secondary | ICD-10-CM | POA: Insufficient documentation

## 2021-08-26 DIAGNOSIS — I471 Supraventricular tachycardia, unspecified: Secondary | ICD-10-CM | POA: Diagnosis present

## 2021-08-26 DIAGNOSIS — F0390 Unspecified dementia without behavioral disturbance: Secondary | ICD-10-CM | POA: Diagnosis present

## 2021-08-26 DIAGNOSIS — R2689 Other abnormalities of gait and mobility: Secondary | ICD-10-CM | POA: Diagnosis not present

## 2021-08-26 DIAGNOSIS — I5033 Acute on chronic diastolic (congestive) heart failure: Secondary | ICD-10-CM | POA: Diagnosis not present

## 2021-08-26 DIAGNOSIS — Z9181 History of falling: Secondary | ICD-10-CM | POA: Insufficient documentation

## 2021-08-26 DIAGNOSIS — I4719 Other supraventricular tachycardia: Secondary | ICD-10-CM

## 2021-08-26 DIAGNOSIS — Z8673 Personal history of transient ischemic attack (TIA), and cerebral infarction without residual deficits: Secondary | ICD-10-CM | POA: Insufficient documentation

## 2021-08-26 DIAGNOSIS — F0150 Vascular dementia without behavioral disturbance: Secondary | ICD-10-CM | POA: Diagnosis present

## 2021-08-26 DIAGNOSIS — F01B0 Vascular dementia, moderate, without behavioral disturbance, psychotic disturbance, mood disturbance, and anxiety: Secondary | ICD-10-CM | POA: Diagnosis not present

## 2021-08-26 DIAGNOSIS — Z66 Do not resuscitate: Secondary | ICD-10-CM | POA: Insufficient documentation

## 2021-08-26 DIAGNOSIS — I4892 Unspecified atrial flutter: Secondary | ICD-10-CM | POA: Diagnosis present

## 2021-08-26 DIAGNOSIS — I480 Paroxysmal atrial fibrillation: Secondary | ICD-10-CM | POA: Diagnosis not present

## 2021-08-26 DIAGNOSIS — Z515 Encounter for palliative care: Secondary | ICD-10-CM | POA: Diagnosis not present

## 2021-08-26 DIAGNOSIS — R001 Bradycardia, unspecified: Secondary | ICD-10-CM | POA: Diagnosis not present

## 2021-08-26 DIAGNOSIS — Z8616 Personal history of COVID-19: Secondary | ICD-10-CM | POA: Insufficient documentation

## 2021-08-26 DIAGNOSIS — J90 Pleural effusion, not elsewhere classified: Secondary | ICD-10-CM | POA: Diagnosis not present

## 2021-08-26 DIAGNOSIS — Z7901 Long term (current) use of anticoagulants: Secondary | ICD-10-CM | POA: Insufficient documentation

## 2021-08-26 DIAGNOSIS — N1831 Chronic kidney disease, stage 3a: Secondary | ICD-10-CM | POA: Diagnosis not present

## 2021-08-26 DIAGNOSIS — K219 Gastro-esophageal reflux disease without esophagitis: Secondary | ICD-10-CM | POA: Diagnosis present

## 2021-08-26 DIAGNOSIS — I509 Heart failure, unspecified: Secondary | ICD-10-CM | POA: Diagnosis not present

## 2021-08-26 DIAGNOSIS — I130 Hypertensive heart and chronic kidney disease with heart failure and stage 1 through stage 4 chronic kidney disease, or unspecified chronic kidney disease: Secondary | ICD-10-CM | POA: Insufficient documentation

## 2021-08-26 DIAGNOSIS — R079 Chest pain, unspecified: Secondary | ICD-10-CM | POA: Diagnosis present

## 2021-08-26 LAB — CBC
HCT: 43.9 % (ref 36.0–46.0)
Hemoglobin: 13.7 g/dL (ref 12.0–15.0)
MCH: 30.4 pg (ref 26.0–34.0)
MCHC: 31.2 g/dL (ref 30.0–36.0)
MCV: 97.6 fL (ref 80.0–100.0)
Platelets: 168 10*3/uL (ref 150–400)
RBC: 4.5 MIL/uL (ref 3.87–5.11)
RDW: 17 % — ABNORMAL HIGH (ref 11.5–15.5)
WBC: 5.1 10*3/uL (ref 4.0–10.5)
nRBC: 0 % (ref 0.0–0.2)

## 2021-08-26 LAB — BASIC METABOLIC PANEL
Anion gap: 11 (ref 5–15)
BUN: 16 mg/dL (ref 8–23)
CO2: 19 mmol/L — ABNORMAL LOW (ref 22–32)
Calcium: 8.4 mg/dL — ABNORMAL LOW (ref 8.9–10.3)
Chloride: 111 mmol/L (ref 98–111)
Creatinine, Ser: 0.9 mg/dL (ref 0.44–1.00)
GFR, Estimated: 58 mL/min — ABNORMAL LOW (ref >60)
Glucose, Bld: 82 mg/dL (ref 70–99)
Potassium: 4.4 mmol/L (ref 3.5–5.1)
Sodium: 141 mmol/L (ref 135–145)

## 2021-08-26 LAB — HEPATIC FUNCTION PANEL
ALT: 12 U/L (ref 0–44)
AST: 27 U/L (ref 15–41)
Albumin: 2.3 g/dL — ABNORMAL LOW (ref 3.5–5.0)
Alkaline Phosphatase: 56 U/L (ref 38–126)
Bilirubin, Direct: 0.3 mg/dL — ABNORMAL HIGH (ref 0.0–0.2)
Indirect Bilirubin: 1 mg/dL — ABNORMAL HIGH (ref 0.3–0.9)
Total Bilirubin: 1.3 mg/dL — ABNORMAL HIGH (ref 0.3–1.2)
Total Protein: 3.9 g/dL — ABNORMAL LOW (ref 6.5–8.1)

## 2021-08-26 LAB — TROPONIN I (HIGH SENSITIVITY)
Troponin I (High Sensitivity): 11 ng/L (ref <18)
Troponin I (High Sensitivity): 8 ng/L (ref <18)
Troponin I (High Sensitivity): 11 ng/L (ref <18)

## 2021-08-26 LAB — BRAIN NATRIURETIC PEPTIDE: B Natriuretic Peptide: 798 pg/mL — ABNORMAL HIGH (ref 0.0–100.0)

## 2021-08-26 LAB — MAGNESIUM: Magnesium: 1.4 mg/dL — ABNORMAL LOW (ref 1.7–2.4)

## 2021-08-26 LAB — MRSA NEXT GEN BY PCR, NASAL: MRSA by PCR Next Gen: DETECTED — AB

## 2021-08-26 LAB — TSH: TSH: 3.779 u[IU]/mL (ref 0.350–4.500)

## 2021-08-26 MED ORDER — METOPROLOL TARTRATE 25 MG PO TABS: 25.0000 mg | ORAL_TABLET | Freq: Two times a day (BID) | ORAL | Status: DC

## 2021-08-26 MED ORDER — METOPROLOL TARTRATE 25 MG PO TABS
25.0000 mg | ORAL_TABLET | Freq: Two times a day (BID) | ORAL | Status: DC
  Administered 2021-08-26 – 2021-08-27 (×3): 25 mg via ORAL
  Filled 2021-08-26 (×3): qty 1

## 2021-08-26 MED ORDER — BISMUTH SUBSALICYLATE 262 MG/15ML PO SUSP: 60.0000 mL | Freq: Every day | ORAL | Status: DC | PRN

## 2021-08-26 MED ORDER — MELATONIN 3 MG PO TABS
3.0000 mg | ORAL_TABLET | Freq: Every day | ORAL | Status: DC
  Administered 2021-08-26: 3 mg via ORAL
  Filled 2021-08-26: qty 1

## 2021-08-26 MED ORDER — CHLORHEXIDINE GLUCONATE CLOTH 2 % EX PADS
6.0000 | MEDICATED_PAD | Freq: Every day | CUTANEOUS | Status: DC
  Administered 2021-08-27: 6 via TOPICAL

## 2021-08-26 MED ORDER — PANTOPRAZOLE SODIUM 40 MG PO TBEC
40.0000 mg | DELAYED_RELEASE_TABLET | Freq: Every day | ORAL | Status: DC
  Administered 2021-08-26 – 2021-08-27 (×2): 40 mg via ORAL
  Filled 2021-08-26 (×2): qty 1

## 2021-08-26 MED ORDER — MUPIROCIN 2 % EX OINT
1.0000 "application " | TOPICAL_OINTMENT | Freq: Two times a day (BID) | CUTANEOUS | Status: DC
  Administered 2021-08-26 – 2021-08-27 (×2): 1 via NASAL
  Filled 2021-08-26: qty 22

## 2021-08-26 MED ORDER — MAGNESIUM SULFATE 2 GM/50ML IV SOLN
2.0000 g | Freq: Once | INTRAVENOUS | Status: AC
  Administered 2021-08-26: 2 g via INTRAVENOUS
  Filled 2021-08-26: qty 50

## 2021-08-26 MED ORDER — ATORVASTATIN CALCIUM 10 MG PO TABS
5.0000 mg | ORAL_TABLET | Freq: Every day | ORAL | Status: DC
  Administered 2021-08-26 – 2021-08-27 (×2): 5 mg via ORAL
  Filled 2021-08-26 (×2): qty 1

## 2021-08-26 MED ORDER — APIXABAN 2.5 MG PO TABS
2.5000 mg | ORAL_TABLET | Freq: Two times a day (BID) | ORAL | Status: DC
  Administered 2021-08-26 – 2021-08-27 (×3): 2.5 mg via ORAL
  Filled 2021-08-26 (×4): qty 1

## 2021-08-26 MED ORDER — ACETAMINOPHEN 325 MG PO TABS: 650.0000 mg | ORAL_TABLET | Freq: Four times a day (QID) | ORAL | Status: DC | PRN

## 2021-08-26 NOTE — ED Notes (Signed)
ED TO INPATIENT HANDOFF REPORT  ED Nurse Name and Phone #: Caryl Pina RN [phone]  S Name/Age/Gender Bianca [name] 87 y.o. female Room/Bed: 024C/024C  Code Status   Code Status: DNR  Home/SNF/Other Skilled nursing facility Patient oriented to: self Is this baseline? Yes   Triage Complete: Triage complete  Chief Complaint Chest pain [R07.9]  Triage Note Pt BIB EMS from [facility] facility called out states that while assessing pt she complained of not feeling well then complained of chest pain. States that HR was in the 50's upon assessment then jumped to 115. EMS reports the same in route to ED that pt was initially SB in the 50's and HR then up to 120 in a-fib. Pt denies CP at this time.    Allergies Allergies  Allergen Reactions   Alphagan P [Brimonidine Tartrate]     Unknown reaction Listed on MAR   Azopt [Brinzolamide] Other (See Comments)    Unknown reaction Listed on MAR   Lumigan [Bimatoprost] Other (See Comments)    Unknown reaction Listed on MAR    Level of Care/Admitting Diagnosis ED Disposition     ED Disposition  Admit   Condition  --   [place]: [place] [100100]  Level of Care: Telemetry Medical [104]  May place patient in observation at [facility] or [place] if equivalent level of care is available:: No  Covid Evaluation: Asymptomatic - no recent exposure (last 10 days) testing not required  Diagnosis: Chest pain [542706]  Admitting Physician: Darliss Cheney [[id number]]  Attending Physician: Darliss Cheney [phone]          B Medical/Surgery History Past Medical History:  Diagnosis Date   Atrial flutter, paroxysmal ([place])    COVID-19    Dementia ([place])    Diastolic dysfunction    Hyperlipidemia    Hypertension    PSVT (paroxysmal supraventricular tachycardia) ([place])    Stroke ([place])    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 No pertinent surgical history.   A IV Location/Drains/Wounds Patient Lines/Drains/Airways Status     Active Line/Drains/Airways     Name Placement date Placement time Site Days   Peripheral IV 08/26/21 20 G Left Antecubital 08/26/21  0910  Antecubital  less than 1   Pressure Injury 01/10/20 Hip Proximal;Right;Lateral;Mid Unstageable - Full thickness tissue loss in which the base of the injury is covered by slough (yellow, tan, gray, green or brown) and/or eschar (tan, brown or black) in the wound bed. dime sized cir 01/10/20  2045  -- 594            Intake/Output Last 24 hours  Intake/Output Summary (Last 24 hours) at 08/26/2021 1150 Last data filed at 08/26/2021 1125 Gross per 24 hour  Intake 150 ml  Output 50 ml  Net 100 ml    Labs/Imaging Results for orders placed or performed during the hospital encounter of 08/26/21 (from the past 48 hour(s))  Basic metabolic panel     Status: Abnormal   Collection Time: 08/26/21  9:19 AM  Result Value Ref Range   Sodium 141 135 - 145 mmol/L   Potassium 4.4 3.5 - 5.1 mmol/L   Chloride 111 98 - 111 mmol/L   CO2 19 (L) 22 - 32 mmol/L   Glucose, Bld 82 70 - 99 mg/dL    Comment: Glucose reference range applies only to samples taken after fasting for at least 8  hours.   BUN 16 8 - 23 mg/dL   Creatinine, Ser 0.90 0.44 - 1.00 mg/dL   Calcium 8.4 (L) 8.9 - 10.3 mg/dL   GFR, Estimated 58 (L) >60 mL/min    Comment: (NOTE) Calculated using the CKD-EPI Creatinine Equation (2021)    Anion gap 11 5 - 15    Comment: Performed at [facility] [address], [place], [place] [address]  CBC     Status: Abnormal   Collection Time: 08/26/21  9:19 AM  Result Value Ref Range   WBC 5.1 4.0 - 10.5 K/uL   RBC 4.50 3.87 - 5.11 MIL/uL   Hemoglobin 13.7 12.0 - 15.0 g/dL   HCT 43.9 36.0 - 46.0 %   MCV 97.6 80.0 - 100.0 fL   MCH 30.4 26.0 - 34.0 pg   MCHC 31.2 30.0 - 36.0 g/dL   RDW 17.0 (H) 11.5 - 15.5 %   Platelets 168 150 - 400  K/uL   nRBC 0.0 0.0 - 0.2 %    Comment: Performed at [facility], [place] [address] [place], [place] [address]  Troponin I (High Sensitivity)     Status: None   Collection Time: 08/26/21  9:19 AM  Result Value Ref Range   Troponin I (High Sensitivity) 11 <18 ng/L    Comment: (NOTE) Elevated high sensitivity troponin I (hsTnI) values and significant  changes across serial measurements may suggest ACS but many other  chronic and acute conditions are known to elevate hsTnI results.  Refer to the Links section for chest pain algorithms and additional  guidance. Performed at [facility], [place] [address], [place], [place] [address]   Brain natriuretic peptide     Status: Abnormal   Collection Time: 08/26/21  9:19 AM  Result Value Ref Range   B Natriuretic Peptide 798.0 (H) 0.0 - 100.0 pg/mL    Comment: Performed at [facility] [address] [place], [place] [address]  TSH     Status: None   Collection Time: 08/26/21  9:58 AM  Result Value Ref Range   TSH 3.779 0.350 - 4.500 uIU/mL    Comment: Performed by a 3rd Generation assay with a functional sensitivity of <=0.01 uIU/mL. Performed at [facility], [place] [address], [place], [place] [address]    DG Chest 2 View  Result Date: 08/26/2021 CLINICAL DATA:  Chest pain.  Bradycardia. EXAM: CHEST - 2 VIEW COMPARISON:  08/13/2021 FINDINGS: Moderate cardiomegaly remains stable. New small right pleural effusion. Increased diffuse interstitial prominence suspicious for mild pulmonary edema. No evidence of pulmonary consolidation. IMPRESSION: Mild congestive heart failure with small right pleural effusion. Electronically Signed   By: Marlaine Hind M.D.   On: 08/26/2021 10:14    Pending Labs Unresulted Labs (From admission, onward)     Start     Ordered   08/27/21 0093  Basic metabolic panel  Tomorrow morning,   R        08/26/21 1117   08/27/21 0500  CBC  Tomorrow morning,   R        08/26/21 1117   08/26/21 1117   Magnesium  Once,   R        08/26/21 1117   08/26/21 1058  Hepatic function panel  Once,   R        08/26/21 1058   08/26/21 1058  Magnesium  Once,   R        08/26/21  1058            Vitals/Pain Today's Vitals   08/26/21 0917 08/26/21 1015 08/26/21 1030 08/26/21 1100  BP:  (!) 139/97 (!) 132/99 (!) 125/103  Pulse:  (!) 138 (!) 139 (!) 137  Resp:  (!) '26 14 18  '$ Temp:      TempSrc:      SpO2:  98% 99% 99%  Weight: 52 kg     Height: '4\' 10"'$  (1.473 m)       Isolation Precautions No active isolations  Medications Medications  metoprolol tartrate (LOPRESSOR) tablet 25 mg (25 mg Oral Given 08/26/21 1052)  acetaminophen (TYLENOL) tablet 650 mg (has no administration in time range)  atorvastatin (LIPITOR) tablet 5 mg (has no administration in time range)  metoprolol tartrate (LOPRESSOR) tablet 25 mg (has no administration in time range)  bismuth subsalicylate (PEPTO BISMOL) 262 MG/15ML suspension 60 mL (has no administration in time range)  pantoprazole (PROTONIX) EC tablet 40 mg (has no administration in time range)  apixaban (ELIQUIS) tablet 2.5 mg (has no administration in time range)  melatonin tablet 3 mg (has no administration in time range)    Mobility walks with person assist High fall risk   Focused Assessments Cardiac Assessment Handoff:  Cardiac Rhythm: Sinus tachycardia No results found for: CKTOTAL, CKMB, CKMBINDEX, TROPONINI No results found for: DDIMER Does the Patient currently have chest pain? No    R Recommendations: See Admitting Provider Note  Report given to:   Additional Notes: Pt alert to self at baseline, hx of dementia.

## 2021-08-26 NOTE — H&P (Addendum)
History and Physical    Bianca [name] PYP:[id number] DOB: 24-Mar-1923 DOA: 08/26/2021  PCP: Virgie Dad, MD  Patient coming from: ALF  I have personally briefly reviewed patient's old medical records in [place]  Chief Complaint: Chest pain  HPI: Bianca [name] is a 88 y.o. female with medical history significant of PAF on Eliquis, PSVT refractory, chronic diastolic CHF, stroke, macular degeneration was sent to the emergency department with chest pain.  Patient has severe dementia, she is only alert and oriented to self.  Unable to provide any meaningful history.  History obtained from the chart review and ER physician.  Per reports, when she was assessed by her primary nurse at assisted living facility today, patient had mentioned some chest discomfort.  EMS was called.  Upon EMS arrival, patient was bradycardic and then in route she became tachycardic.  Patient had not complained of any chest pain to the EMS or to the ED physician.  ED Course: Upon arrival to ED, patient was tachycardic but blood pressure was slightly elevated diastolic.  Despite of this, she had no complaints.  EKG showed junctional tachycardia with prolonged QTc.  Patient's primary cardiologist Dr. Virgina Jock was consulted who recommended 25 mg of p.o. metoprolol.  Admission requested under hospital service.  Chest x-ray shows mild congestive heart failure with small right pleural effusion.  Despite of this, patient is not hypoxic.  Review of Systems: As per HPI otherwise negative.    Past Medical History:  Diagnosis Date   Atrial flutter, paroxysmal ([place])    COVID-19    Dementia (HCC)    Diastolic dysfunction    Hyperlipidemia    Hypertension    PSVT (paroxysmal supraventricular tachycardia) ([place])    Stroke ([place])    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 ([place])     History reviewed. No pertinent surgical history.   reports that she has never  smoked. She has never used smokeless tobacco. She reports that she does not drink alcohol and does not use drugs.  Allergies  Allergen Reactions   Alphagan P [Brimonidine Tartrate]     Unknown reaction Listed on MAR   Azopt [Brinzolamide] Other (See Comments)    Unknown reaction Listed on MAR   Lumigan [Bimatoprost] Other (See Comments)    Unknown reaction Listed on MAR    History reviewed. No pertinent family history.  Prior to Admission medications   Medication Sig Start Date End Date Taking? Authorizing Provider  acetaminophen (TYLENOL) 325 MG tablet Take 650 mg by mouth every 6 (six) hours as needed.    [provider]  apixaban (ELIQUIS) 2.5 MG TABS tablet Take 1 tablet (2.5 mg total) by mouth 2 (two) times daily. 11/10/19   Hosie Poisson, MD  atorvastatin (LIPITOR) 10 MG tablet Take 5 mg by mouth daily. 1/2 a tablet=5 mg    [provider]  B Complex-Folic Acid (B COMPLEX VITAMINS, W/ FA, PO) Take 1 tablet by mouth daily. Vitamin B complex - Folic Acid 0.4 mg    [provider]  bismuth subsalicylate (PEPTO BISMOL) 262 MG/15ML suspension Take 60 mLs by mouth daily as needed for indigestion or diarrhea or loose stools.     [provider]  Calcium Carbonate-Vitamin D 600-400 MG-UNIT tablet Take 1 tablet by mouth daily.    [provider]  magic mouthwash SOLN Take 5 mLs by mouth in the morning, at noon, in the evening, and at bedtime.  [provider]  melatonin 3 MG TABS tablet Take 3 mg by mouth daily at 8 pm.    [provider]  metoprolol tartrate (LOPRESSOR) 25 MG tablet Take 1 tablet (25 mg total) by mouth in the morning, at noon, and at bedtime. 08/16/21   Caren Griffins, MD  MULTIPLE VITAMIN-FOLIC ACID PO Take 1 tablet by mouth daily.    [provider]  Nutritional Supplements (FEEDING SUPPLEMENT, BOOST BREEZE,) LIQD Take 1 Bottle by mouth daily.    [provider]  omeprazole (PRILOSEC) 20  MG capsule Take 20 mg by mouth daily.     [provider]  Sodium Fluoride (PREVIDENT 5000 BOOSTER PLUS) 1.1 % PSTE Place 1 application. onto teeth daily at 8 pm.    [provider]  timolol (TIMOPTIC) 0.5 % ophthalmic solution Place 1 drop into both eyes daily. 08/01/20   [provider]  triamcinolone ointment (KENALOG) 0.1 % Apply 1 application. topically 2 (two) times daily.    [provider]    Physical Exam: Vitals:   08/26/21 0917 08/26/21 1015 08/26/21 1030 08/26/21 1100  BP:  (!) 139/97 (!) 132/99 (!) 125/103  Pulse:  (!) 138 (!) 139 (!) 137  Resp:  (!) '26 14 18  '$ Temp:      TempSrc:      SpO2:  98% 99% 99%  Weight: 52 kg     Height: '4\' 10"'$  (1.473 m)       Constitutional: NAD, calm, comfortable Vitals:   08/26/21 0917 08/26/21 1015 08/26/21 1030 08/26/21 1100  BP:  (!) 139/97 (!) 132/99 (!) 125/103  Pulse:  (!) 138 (!) 139 (!) 137  Resp:  (!) '26 14 18  '$ Temp:      TempSrc:      SpO2:  98% 99% 99%  Weight: 52 kg     Height: '4\' 10"'$  (1.473 m)      Eyes: PERRL, lids and conjunctivae normal ENMT: Mucous membranes are moist. Posterior pharynx clear of any exudate or lesions.Normal dentition.  Neck: normal, supple, no masses, no thyromegaly Respiratory: Very faint bibasilar crackles. Normal respiratory effort. No accessory muscle use.  Cardiovascular: Regular rate and rhythm with a tachycardia, no murmurs / rubs / gallops. No extremity edema. 2+ pedal pulses. No carotid bruits.  Abdomen: no tenderness, no masses palpated. No hepatosplenomegaly. Bowel sounds positive.  Musculoskeletal: no clubbing / cyanosis. No joint deformity upper and lower extremities. Good ROM, no contractures. Normal muscle tone.  Skin: no rashes, lesions, ulcers. No induration Neurologic: CN 2-12 grossly intact. Sensation intact, DTR normal. Strength 5/5 in all 4.    Labs on Admission: I have personally reviewed following labs and imaging studies  CBC: Recent Labs   Lab 08/26/21 0919  WBC 5.1  HGB 13.7  HCT 43.9  MCV 97.6  PLT 737   Basic Metabolic Panel: Recent Labs  Lab 08/26/21 0919  NA 141  K 4.4  CL 111  CO2 19*  GLUCOSE 82  BUN 16  CREATININE 0.90  CALCIUM 8.4*   GFR: Estimated Creatinine Clearance: 25.6 mL/min (by C-G formula based on SCr of 0.9 mg/dL). Liver Function Tests: No results for input(s): AST, ALT, ALKPHOS, BILITOT, PROT, ALBUMIN in the last 168 hours. No results for input(s): LIPASE, AMYLASE in the last 168 hours. No results for input(s): AMMONIA in the last 168 hours. Coagulation Profile: No results for input(s): INR, PROTIME in the last 168 hours. Cardiac Enzymes: No results for input(s): CKTOTAL, CKMB, CKMBINDEX,  TROPONINI in the last 168 hours. BNP (last 3 results) No results for input(s): PROBNP in the last 8760 hours. HbA1C: No results for input(s): HGBA1C in the last 72 hours. CBG: No results for input(s): GLUCAP in the last 168 hours. Lipid Profile: No results for input(s): CHOL, HDL, LDLCALC, TRIG, CHOLHDL, LDLDIRECT in the last 72 hours. Thyroid Function Tests: Recent Labs    08/26/21 0958  TSH 3.779   Anemia Panel: No results for input(s): VITAMINB12, FOLATE, FERRITIN, TIBC, IRON, RETICCTPCT in the last 72 hours. Urine analysis:    Component Value Date/Time   COLORURINE YELLOW 01/10/2020 0624   APPEARANCEUR CLEAR 01/10/2020 0624   LABSPEC 1.016 01/10/2020 0624   PHURINE 5.0 01/10/2020 0624   GLUCOSEU NEGATIVE 01/10/2020 0624   HGBUR NEGATIVE 01/10/2020 0624   BILIRUBINUR NEGATIVE 01/10/2020 0624   KETONESUR NEGATIVE 01/10/2020 0624   PROTEINUR NEGATIVE 01/10/2020 0624   UROBILINOGEN 0.2 09/18/2009 1810   NITRITE NEGATIVE 01/10/2020 0624   LEUKOCYTESUR TRACE (A) 01/10/2020 0624    Radiological Exams on Admission: DG Chest 2 View  Result Date: 08/26/2021 CLINICAL DATA:  Chest pain.  Bradycardia. EXAM: CHEST - 2 VIEW COMPARISON:  08/13/2021 FINDINGS: Moderate cardiomegaly remains  stable. New small right pleural effusion. Increased diffuse interstitial prominence suspicious for mild pulmonary edema. No evidence of pulmonary consolidation. IMPRESSION: Mild congestive heart failure with small right pleural effusion. Electronically Signed   By: Marlaine Hind M.D.   On: 08/26/2021 10:14    EKG: Independently reviewed.  As above.  Assessment/Plan Principal Problem:   Chest pain Active Problems:   Paroxysmal SVT (supraventricular tachycardia) (HCC)   GERD (gastroesophageal reflux disease)   Vascular dementia (HCC)   Paroxysmal atrial flutter (HCC)   Junctional tachycardia (HCC)   Chest pain: Complaint from patient's nurse.  Patient has not complained of any chest pain.  EKG does not show any acute ST-T wave changes.  Troponin x1 negative.  Follow cardiac enzymes.  Defer further management to cardiology.  Acute on chronic congestive heart failure with preserved ejection fraction/mild right pleural effusion: Last echo in chart was done in July 2021 which showed 60 to 65% ejection fraction but grade 2 diastolic dysfunction.  Chest x-ray with pulmonary edema/congestive heart failure.  Despite of this, patient is totally asymptomatic and saturating 92% on room air.  She has been placed on 2 L of oxygen just for the comfort.  I have ordered repeat echo.  Will defer further management or decision of diuretic to cardiology.  Prolonged QTc: Potassium normal.  Checking magnesium.  Keep potassium over 4 and magnesium over 2.  Avoid QTc prolonging medications.  Repeat EKG in 1 to 2 days.  History of SVT but now with junctional tachycardia.  Also history of paroxysmal atrial fibrillation: She received metoprolol 25 mg p.o. daily.  She is on at the same dosage twice daily.  We will resume starting tonight.  We will also resume home anticoagulation.  Defer further management to cardiology.  Currently she is asymptomatic.  CKD stage IIIa: At baseline.  Hyperlipidemia: Resume  atorvastatin.  GERD: Resume PPI.  DVT prophylaxis: apixaban (ELIQUIS) tablet 2.5 mg Start: 08/26/21 1130 Code Status: DNR per chart Family Communication: None present at bedside.  Disposition Plan: Potentially back to ALF when cleared by cardiology Consults called: Cardiology consulted  Darliss Cheney MD [facility]  *Please note that this is a verbal dictation therefore any spelling or grammatical errors are due to the "[place] [employer]" system interpretation.  Please page  via Amion and do not message via secure chat for urgent patient care matters. Secure chat can be used for non urgent patient care matters. 08/26/2021, 11:18 AM  To contact the attending provider between 7A-7P or the covering provider during after hours 7P-7A, please log into the web site [email or web address]

## 2021-08-26 NOTE — Progress Notes (Signed)
   08/26/21 1701  Assess: MEWS Score  Temp 97.9 F (36.6 C)  BP 129/87  Pulse Rate (!) 123  ECG Heart Rate (!) 122  Resp 18  SpO2 90 %  O2 Device Room Air  Assess: MEWS Score  MEWS Temp 0  MEWS Systolic 0  MEWS Pulse 2  MEWS RR 0  MEWS LOC 0  MEWS Score 2  MEWS Score Color Yellow  Assess: if the MEWS score is Yellow or Red  Were vital signs taken at a resting state? Yes  Focused Assessment No change from prior assessment  Early Detection of Sepsis Score *See Row Information* Low  MEWS guidelines implemented *See Row Information* Yes  Treat  MEWS Interventions Other (Comment) (No new interventions, patient has had elevated HR off and on since came in to ED.)  Take Vital Signs  Increase Vital Sign Frequency  Yellow: Q 2hr X 2 then Q 4hr X 2, if remains yellow, continue Q 4hrs  Escalate  MEWS: Escalate Yellow: discuss with charge nurse/RN and consider discussing with provider and RRT  Notify: Charge Nurse/RN  Name of Charge Nurse/RN Notified Yoko RN  Date Charge Nurse/RN Notified 08/26/21  Time Charge Nurse/RN Notified 1724  Notify: Provider  Provider Name/Title Dr. Virgina Jock  Date Provider Notified 08/26/21  Time Provider Notified 1323  Method of Notification Page  Notification Reason Other (Comment) (arrival on floor with HR change from 120 to 50.)  Provider response No new orders  Date of Provider Response 08/26/21  Time of Provider Response 1325  Document  Patient Outcome Other (Comment) (remains stable.)  Progress note created (see row info) Yes

## 2021-08-26 NOTE — Plan of Care (Signed)
  Problem: Activity: Goal: Ability to tolerate increased activity will improve Outcome: Progressing   

## 2021-08-26 NOTE — Progress Notes (Addendum)
Patient transferred from ED at 1306hrs.  Initially SVT on monitor with HR in 120s but had 3 sec pause and now SB with HR 50.  BP 126/71.  Dr. Doristine Bosworth and Dr. Virgina Jock notified.  Patient oriented to unit.  Confused and not able to complete admission history at this time.  Bed alarm on.

## 2021-08-26 NOTE — ED Notes (Signed)
MD Tegeler made aware of HR in upper 130's-140's.

## 2021-08-26 NOTE — ED Notes (Signed)
Lab called to add on Mg, Hepatic, and BNP.

## 2021-08-26 NOTE — Consult Note (Addendum)
CARDIOLOGY CONSULT NOTE  Patient ID: JEVON LITTLEPAGE MRN: [id number] DOB/AGE: [birth year] 88 y.o.  Admit date: 08/26/2021 Referring Physician: Zacarias Pontes, ER/[facility] Reason for Consultation: Tachycardia, chest pain  HPI:   88 y.o. Caucasian female  with recurrent PSVT, PAF by history, hypertension, hyperlipidemia, h/o stroke, dementia, presented with chest pain and tachycardia.  Patient was recently hospitalized last week with similar complaints of tachycardia.  She was evaluated by EP at that time.  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  Digoxin was not recommended due to her frailty and poor nutrition.  Beta-blocker was recommended as tolerated.  She was deemed not to be a candidate for ablation or EP study due to frailty. Patient was back to SNF.  Today, she presented to ER with complaints of chest pain.  On arrival, she was found to be tachycardic in the 140s without any active complaints. On my interview, she is quite unaware of her whereabouts and says "I don't remember anything". She denies chest pain, palpitations.  Past Medical History:  Diagnosis Date   Atrial flutter, paroxysmal ([place])    COVID-19    Dementia (HCC)    Diastolic dysfunction    Hyperlipidemia    Hypertension    PSVT (paroxysmal supraventricular tachycardia) (HCC)    Stroke ([place])    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 ([place])       Social History: Social History   Socioeconomic History   Marital status: Widowed    Spouse name: Not on file   Number of children: 0   Years of education: Not on file   Highest education level: Not on file  Occupational History   Not on file  Tobacco Use   Smoking status: Never   Smokeless tobacco: Never  Vaping Use   Vaping Use: Never used  Substance and Sexual Activity   Alcohol use: Never   Drug use: Never   Sexual activity: Not on file  Other Topics Concern   Not on file   Social History Narrative   Not on file   Social Determinants of Health   Financial Resource Strain: Not on file  Food Insecurity: No Food Insecurity   Worried About Running Out of Food in the Last Year: Never true   Ran Out of Food in the Last Year: Never true  Transportation Needs: Not on file  Physical Activity: Not on file  Stress: Not on file  Social Connections: Not on file  Intimate Partner Violence: Not on file     (Not in a hospital admission)   Review of Systems  Cardiovascular:  Negative for chest pain and palpitations.     Physical Exam: Physical Exam Vitals and nursing note reviewed.  Constitutional:      General: She is not in acute distress. Neck:     Vascular: No JVD.  Cardiovascular:     Rate and Rhythm: Normal rate and regular rhythm.     Heart sounds: Normal heart sounds. No murmur heard. Pulmonary:     Effort: Pulmonary effort is normal.     Breath sounds: Normal breath sounds. No wheezing or rales.  Musculoskeletal:     Right lower leg: No edema.     Left lower leg: No edema.  Neurological:     Comments: Oriented to self only        Imaging/tests reviewed and independently interpreted: Lab Results: CBC. BMP, BNP, trop HS  Cardiac Studies:  CXR  08/26/2021  Telemetry 08/26/2021: PSVT, converted to sinus rhythm/bradycardia  EKG 08/26/2021: Supraventricular tachycardia 127 bpm Right axis deviation Low voltage Nonspecific ST-T abnormality  Echocardiogram 11/05/2019:  1. Left ventricular ejection fraction, by estimation, is 60 to 65%. The  left ventricle has normal function. The left ventricle has no regional  wall motion abnormalities. There is mild left ventricular hypertrophy.  Left ventricular diastolic parameters  are consistent with Grade II diastolic dysfunction (pseudonormalization).  Elevated left atrial pressure.   2. Right ventricular systolic function is normal. The right ventricular  size is normal. There is mildly  elevated pulmonary artery systolic  pressure. The estimated right ventricular systolic pressure is 50.3 mmHg.   3. Right atrial size was mildly dilated.   4. The mitral valve is normal in structure. Trivial mitral valve  regurgitation.   5. Tricuspid valve regurgitation is moderate to severe.   6. The aortic valve is tricuspid. Aortic valve regurgitation is mild.  Mild aortic valve sclerosis is present, with no evidence of aortic valve  stenosis.   7. The inferior vena cava is normal in size with greater than 50%  respiratory variability, suggesting right atrial pressure of 3 mmHg.     Assessment & Recommendations:  88 y.o. Caucasian female  with recurrent PSVT, PAF by history, hypertension, hyperlipidemia, h/o stroke, dementia, presented with chest pain and tachycardia.  PSVT:  Currently resolved. Recommend PO metoprolol 25 mg bid. Do not recommend any other antiarrhythmic agents.  She is not a candidate for any invasive EP evaluation with her advanced age, frailty, and dementia.  No further intervention necessary at this time.  I do not think echocardiogram will change the management course either.   Discussed interpretation of tests and management recommendations with the primary team     Nigel Mormon, MD Pager: [phone] Office: [phone]

## 2021-08-26 NOTE — Consult Note (Signed)
Consultation Note Date: 08/26/2021   Patient Name: Bianca [name]  DOB: 04-18-1922  MRN: [id number]  Age / Sex: 88 y.o., female  PCP: Virgie Dad, MD Referring Physician: Darliss Cheney, MD  Reason for Consultation: Establishing goals of care  HPI/Patient Profile: 88 y.o. female  with past medical history of  PAF on Eliquis, PSVT refractory, chronic diastolic CHF, stroke, macular degeneration, severe dementia, admitted on 08/26/2021 with chest pain.  Patient has not complained of chest pain to EMS or ED. EKG showed junctional tachycardia with prolonged QTc.  Patient's primary cardiologist Dr. Virgina Jock was consulted who recommended 25 mg of p.o. metoprolol. Patient is asymptomatic.   PMT has been consulted to assist with goals of care conversation.  Clinical Assessment and Goals of Care:  I have reviewed medical records including EPIC notes, labs and imaging, assessed the patient and then called patient's nephew/HCPOA Liliane Channel to discuss diagnosis, prognosis, GOC, EOL wishes, disposition and options.  I introduced Palliative Medicine as specialized medical care for people living with serious illness. It focuses on providing relief from the symptoms and stress of a serious illness. The goal is to improve quality of life for both the patient and the family.  We discussed a brief life review of the patient and then focused on their current illness.   I attempted to elicit values and goals of care important to the patient.    Medical History Review and Understanding:  Discussed patient's illness since PMT's last interaction with patient in July 2021.   Social History: Patient resides at [place].  Functional and Nutritional State: Liliane Channel shares that patient's appetite has always been low. She has been functioning well at [facility], ambulates to the dining hall without issues. Cognitively declining.     Advance Directives: A detailed discussion regarding advanced directives was had. Nephew Liliane Channel is patient's HCPOA.   Code Status: Concepts specific to code status, artificial hydration and nutrition, rehospitalizations were discussed.  Discussion: Patient is pleasantly confused and tells me she has nothing bothering her at this time. She states "we just do everything the best we can." Her nephew Liliane Channel shares that his mother is enrolled in [facility] at the same facility. We discussed the current care plan and lack of further options to treat patient's arrhythmias should they reoccur. Reviewed patient's previous statements when meeting with PMT in Jul 22, 2019 - she would accept her death if it appeared she was at end of life. She is not currently suffering from any symptoms and has a good quality of life. Liliane Channel is agreeable to ongoing palliative care support and conversation based on her trajectory.     The difference between aggressive medical intervention and comfort care was considered in light of the patient's goals of care. Hospice and Palliative Care services outpatient were explained and offered.   Discussed the importance of continued conversation with family and the medical providers regarding overall plan of care and treatment options, ensuring decisions are within the context of the patient's values and GOCs.   Questions and concerns were addressed. The family was encouraged to call with questions or concerns.  PMT will continue to support holistically.    SUMMARY OF RECOMMENDATIONS   -DNR -Continue current supportive care -Patient's nephew is agreeable to outpatient palliative care referral, appreciate TOC assistance -PMT will continue to follow   Prognosis:  Unable to determine  Discharge Planning:  ALF with palliative care       Primary Diagnoses: Present on Admission:  Chest  pain  GERD (gastroesophageal reflux disease)  Paroxysmal SVT (supraventricular  tachycardia) (HCC)  Paroxysmal atrial flutter (HCC)  Vascular dementia ([place])   Review of Systems  All other systems reviewed and are negative.  Physical Exam Vitals and nursing note reviewed.  Constitutional:      General: She is not in acute distress. Cardiovascular:     Rate and Rhythm: Normal rate.  Pulmonary:     Effort: Pulmonary effort is normal. No respiratory distress.  Skin:    General: Skin is warm and dry.  Neurological:     Mental Status: She is alert. Mental status is at baseline. She is disoriented.  Psychiatric:        Mood and Affect: Mood normal.    Vital Signs: BP 126/71 (BP Location: Right Arm)   Pulse 60   Temp (!) 97.5 F (36.4 C) (Oral)   Resp 18   Ht '4\' 10"'$  (1.473 m)   Wt 51.3 kg   SpO2 97%   BMI 23.64 kg/m  Pain Scale: Faces   Pain Score: 0-No pain   SpO2: SpO2: 97 % O2 Device:SpO2: 97 % O2 Flow Rate: .O2 Flow Rate (L/min): 2 L/min  Palliative Assessment/Data:    Total time: I spent 60 minutes in the care of the patient today in the above activities and documenting the encounter.   Dorthy Cooler, PA-C Palliative Medicine Team Team phone # [phone]  Thank you for allowing the Palliative Medicine Team to assist in the care of this patient. Please utilize secure chat with additional questions, if there is no response within 30 minutes please call the above phone number.  Palliative Medicine Team providers are available by phone from 7am to 7pm daily and can be reached through the team cell phone.  Should this patient require assistance outside of these hours, please call the patient's attending physician.

## 2021-08-26 NOTE — ED Triage Notes (Signed)
Pt BIB EMS from [facility] facility called out states that while assessing pt she complained of not feeling well then complained of chest pain. States that HR was in the 50's upon assessment then jumped to 115. EMS reports the same in route to ED that pt was initially SB in the 50's and HR then up to 120 in a-fib. Pt denies CP at this time.

## 2021-08-26 NOTE — ED Provider Notes (Signed)
[facility] EMERGENCY DEPARTMENT Provider Note   CSN: [id number] Arrival date & time: 08/26/21  9833     History  Chief Complaint  Patient presents with   Chest Pain    Bianca [name] is a 88 y.o. female.  The history is provided by the patient, the EMS personnel and medical records. The history is limited by the condition of the patient. No language interpreter was used.  Chest Pain Pain location:  Substernal area Pain radiates to:  Does not radiate Timing:  Unable to specify Progression:  Unable to specify Chronicity:  Recurrent Associated symptoms: fatigue and lower extremity edema   Associated symptoms: no abdominal pain, no altered mental status, no anxiety, no back pain, no cough, no diaphoresis, no fever, no headache, no nausea, no numbness, no palpitations, no shortness of breath, no vomiting and no weakness       Home Medications Prior to Admission medications   Medication Sig Start Date End Date Taking? Authorizing Provider  acetaminophen (TYLENOL) 325 MG tablet Take 650 mg by mouth every 6 (six) hours as needed.    [provider]  apixaban (ELIQUIS) 2.5 MG TABS tablet Take 1 tablet (2.5 mg total) by mouth 2 (two) times daily. 11/10/19   Hosie Poisson, MD  atorvastatin (LIPITOR) 10 MG tablet Take 5 mg by mouth daily. 1/2 a tablet=5 mg    [provider]  B Complex-Folic Acid (B COMPLEX VITAMINS, W/ FA, PO) Take 1 tablet by mouth daily. Vitamin B complex - Folic Acid 0.4 mg    [provider]  bismuth subsalicylate (PEPTO BISMOL) 262 MG/15ML suspension Take 60 mLs by mouth daily as needed for indigestion or diarrhea or loose stools.     [provider]  Calcium Carbonate-Vitamin D 600-400 MG-UNIT tablet Take 1 tablet by mouth daily.    [provider]  magic mouthwash SOLN Take 5 mLs by mouth in the morning, at noon, in the evening, and at bedtime.    [provider]  melatonin 3 MG TABS tablet  Take 3 mg by mouth daily at 8 pm.    [provider]  metoprolol tartrate (LOPRESSOR) 25 MG tablet Take 1 tablet (25 mg total) by mouth in the morning, at noon, and at bedtime. 08/16/21   Caren Griffins, MD  MULTIPLE VITAMIN-FOLIC ACID PO Take 1 tablet by mouth daily.    [provider]  Nutritional Supplements (FEEDING SUPPLEMENT, BOOST BREEZE,) LIQD Take 1 Bottle by mouth daily.    [provider]  omeprazole (PRILOSEC) 20 MG capsule Take 20 mg by mouth daily.     [provider]  Sodium Fluoride (PREVIDENT 5000 BOOSTER PLUS) 1.1 % PSTE Place 1 application. onto teeth daily at 8 pm.    [provider]  timolol (TIMOPTIC) 0.5 % ophthalmic solution Place 1 drop into both eyes daily. 08/01/20   [provider]  triamcinolone ointment (KENALOG) 0.1 % Apply 1 application. topically 2 (two) times daily.    [provider]      Allergies    Alphagan p [brimonidine tartrate], Azopt [brinzolamide], and Lumigan [bimatoprost]    Review of Systems   Review of Systems  Constitutional:  Positive for fatigue. Negative for chills, diaphoresis and fever.  HENT:  Negative for congestion.   Respiratory:  Negative for cough, chest tightness, shortness of breath and wheezing.   Cardiovascular:  Positive for chest pain. Negative for palpitations.  Gastrointestinal:  Negative for abdominal pain, constipation,  diarrhea, nausea and vomiting.  Genitourinary:  Negative for dysuria, flank pain and frequency.  Musculoskeletal:  Negative for back pain, neck pain and neck stiffness.  Skin:  Negative for rash and wound.  Neurological:  Negative for weakness, light-headedness, numbness and headaches.  Psychiatric/Behavioral:  Negative for agitation and confusion.   All other systems reviewed and are negative.  Physical Exam Updated Vital Signs BP 126/71 (BP Location: Right Arm)   Pulse 60   Temp (!) 97.5 F (36.4 C) (Oral)   Resp 18   Ht '4\' 10"'$   (1.473 m)   Wt 51.3 kg   SpO2 97%   BMI 23.64 kg/m  Physical Exam Vitals and nursing note reviewed.  Constitutional:      General: She is not in acute distress.    Appearance: She is well-developed. She is not ill-appearing, toxic-appearing or diaphoretic.  HENT:     Head: Normocephalic and atraumatic.  Eyes:     Conjunctiva/sclera: Conjunctivae normal.     Pupils: Pupils are equal, round, and reactive to light.  Cardiovascular:     Rate and Rhythm: Tachycardia present. Rhythm irregular.     Heart sounds: No murmur heard. Pulmonary:     Effort: Pulmonary effort is normal. No respiratory distress.     Breath sounds: Rales present. No decreased breath sounds, wheezing or rhonchi.  Chest:     Chest wall: No tenderness.  Abdominal:     Palpations: Abdomen is soft.     Tenderness: There is no abdominal tenderness.  Musculoskeletal:        General: No swelling.     Cervical back: Neck supple.     Right lower leg: No tenderness. Edema present.     Left lower leg: No tenderness. Edema present.  Skin:    General: Skin is warm and dry.     Capillary Refill: Capillary refill takes less than 2 seconds.  Neurological:     General: No focal deficit present.     Mental Status: She is alert.  Psychiatric:        Mood and Affect: Mood normal.    ED Results / Procedures / Treatments   Labs (all labs ordered are listed, but only abnormal results are displayed) Labs Reviewed  BASIC METABOLIC PANEL - Abnormal; Notable for the following components:      Result Value   CO2 19 (*)    Calcium 8.4 (*)    GFR, Estimated 58 (*)    All other components within normal limits  CBC - Abnormal; Notable for the following components:   RDW 17.0 (*)    All other components within normal limits  BRAIN NATRIURETIC PEPTIDE - Abnormal; Notable for the following components:   B Natriuretic Peptide 798.0 (*)    All other components within normal limits  HEPATIC FUNCTION PANEL - Abnormal; Notable for  the following components:   Total Protein 3.9 (*)    Albumin 2.3 (*)    Total Bilirubin 1.3 (*)    Bilirubin, Direct 0.3 (*)    Indirect Bilirubin 1.0 (*)    All other components within normal limits  MAGNESIUM - Abnormal; Notable for the following components:   Magnesium 1.4 (*)    All other components within normal limits  TSH  MAGNESIUM  TROPONIN I (HIGH SENSITIVITY)  TROPONIN I (HIGH SENSITIVITY)  TROPONIN I (HIGH SENSITIVITY)  TROPONIN I (HIGH SENSITIVITY)    EKG EKG Interpretation  Date/Time:  Saturday Aug 26 2021 09:15:23 EDT Ventricular Rate:  127  PR Interval:    QRS Duration: 110 QT Interval:  439 QTC Calculation: 639 R Axis:   172 Text Interpretation: Junctional tachycardia Right axis deviation Low voltage, extremity leads ST depression, probably rate related Prolonged QT interval when compared to prior, faster rate. no STEMI Confirmed by Antony Blackbird [phone]) on 08/26/2021 9:20:15 AM  Radiology DG Chest 2 View  Result Date: 08/26/2021 CLINICAL DATA:  Chest pain.  Bradycardia. EXAM: CHEST - 2 VIEW COMPARISON:  08/13/2021 FINDINGS: Moderate cardiomegaly remains stable. New small right pleural effusion. Increased diffuse interstitial prominence suspicious for mild pulmonary edema. No evidence of pulmonary consolidation. IMPRESSION: Mild congestive heart failure with small right pleural effusion. Electronically Signed   By: Marlaine Hind M.D.   On: 08/26/2021 10:14    Procedures Procedures    Medications Ordered in ED Medications  metoprolol tartrate (LOPRESSOR) tablet 25 mg (25 mg Oral Given 08/26/21 1052)  acetaminophen (TYLENOL) tablet 650 mg (has no administration in time range)  atorvastatin (LIPITOR) tablet 5 mg (has no administration in time range)  metoprolol tartrate (LOPRESSOR) tablet 25 mg (has no administration in time range)  bismuth subsalicylate (PEPTO BISMOL) 262 MG/15ML suspension 60 mL (has no administration in time range)  pantoprazole (PROTONIX)  EC tablet 40 mg (has no administration in time range)  apixaban (ELIQUIS) tablet 2.5 mg (has no administration in time range)  melatonin tablet 3 mg (has no administration in time range)  magnesium sulfate IVPB 2 g 50 mL (has no administration in time range)    ED Course/ Medical Decision Making/ A&P                           Medical Decision Making Amount and/or Complexity of Data Reviewed Labs: ordered. Radiology: ordered.  Risk Prescription drug management. Decision regarding hospitalization.    ZENOBIA KUENNEN is a 88 y.o. female with a past medical history significant for vascular dementia, paroxysmal atrial fibrillation on Eliquis therapy, previous stroke, prolonged QT, previous SVT, and recent admission for tachycardia who presents with chest pain and tachycardia.  According to EMS, patient was recently discharged from the hospital in the last 2 weeks for intermittent tachycardia and chest discomfort.  She reportedly had done well this week but then this morning was complaining of chest pain and was found to have heart rate in the 130s.  Patient brought in for evaluation.  Given the patient's chest pain and fast heart rate, she will be worked up for possible life-threatening condition.  She is DNR per chart.  Patient currently denies any symptoms whatsoever despite her heart rate being in the 130s and 140s.  She is denying any current chest pain, palpitations, or shortness of breath.  She does not remember having chest pain this morning but EMS reported she describes some pressure.  He denies any nausea, vomiting, constipation, or diarrhea.  She does report feeling fatigued.  She reports she has chronic edema.  On exam, lungs had rales and chest was nontender.  Abdomen was nontender.  Does not appreciate a loud murmur.  Patient does have pulses in extremities but had edema in the legs bilaterally.  Clinically I am concerned about some fluid overload based on her exam.  Given the  heart rate into the 140s, I suspect it is A-fib with RVR versus a sinus tachycardia.  Given her intermittent chest pain and this fast heart rate, anticipate she will need admission.  Due to her lungs sounding wet and  history of diastolic dysfunction I am concerned about giving her fluids to help lower her heart rate down.  I personally reviewed and interpreted chest x-ray and I agree with concern for pleural effusion and some edema.  EKG did not show STEMI.  Patient had lab work that showed elevated BNP nearly 800 however initial troponin was normal.  Will trend.  CBC reassuring.  Metabolic panel shows slight hypocalcemia and magnesium slightly low.  We will call cardiology for recommendations on both rate control and disposition.  10:48 AM Just spoke to Dr. Charolette Child with [facility] cardiology who reviewed the case.  He requested we give 25 mg oral metoprolol, he will see the patient, and she will need to be admitted.  He agreed to hold on fluids and initially hold on either diltiazem or amiodarone drips.  We will call for admission for intermittent chest pain with now persistent tachycardia in the 130s and 140s despite her essentially being symptom-free otherwise.        Final Clinical Impression(s) / ED Diagnoses Final diagnoses:  Atypical chest pain      Clinical Impression: 1. Atypical chest pain   2. Other chest pain   3. SVT (supraventricular tachycardia) ([place])     Disposition: Admit  This note was prepared with assistance of Dragon voice recognition software. Occasional wrong-word or sound-a-like substitutions may have occurred due to the inherent limitations of voice recognition software.     Sade Mehlhoff, Gwenyth Allegra, MD 08/26/21 1356

## 2021-08-27 ENCOUNTER — Observation Stay (HOSPITAL_BASED_OUTPATIENT_CLINIC_OR_DEPARTMENT_OTHER): Payer: No Typology Code available for payment source

## 2021-08-27 DIAGNOSIS — I5032 Chronic diastolic (congestive) heart failure: Secondary | ICD-10-CM | POA: Diagnosis not present

## 2021-08-27 DIAGNOSIS — I4892 Unspecified atrial flutter: Secondary | ICD-10-CM | POA: Diagnosis not present

## 2021-08-27 DIAGNOSIS — F01B0 Vascular dementia, moderate, without behavioral disturbance, psychotic disturbance, mood disturbance, and anxiety: Secondary | ICD-10-CM | POA: Diagnosis not present

## 2021-08-27 DIAGNOSIS — R079 Chest pain, unspecified: Secondary | ICD-10-CM | POA: Diagnosis not present

## 2021-08-27 DIAGNOSIS — R0789 Other chest pain: Secondary | ICD-10-CM | POA: Diagnosis not present

## 2021-08-27 DIAGNOSIS — I471 Supraventricular tachycardia: Secondary | ICD-10-CM | POA: Diagnosis not present

## 2021-08-27 LAB — ECHOCARDIOGRAM COMPLETE
AR max vel: 2 cm2
AV Peak grad: 3.8 mmHg
Ao pk vel: 0.98 m/s
Height: 58 in
S' Lateral: 2.05 cm
Weight: 1838.4 oz

## 2021-08-27 LAB — CBC
HCT: 42.6 % (ref 36.0–46.0)
Hemoglobin: 13.8 g/dL (ref 12.0–15.0)
MCH: 30.6 pg (ref 26.0–34.0)
MCHC: 32.4 g/dL (ref 30.0–36.0)
MCV: 94.5 fL (ref 80.0–100.0)
Platelets: 162 10*3/uL (ref 150–400)
RBC: 4.51 MIL/uL (ref 3.87–5.11)
RDW: 16.6 % — ABNORMAL HIGH (ref 11.5–15.5)
WBC: 6.1 10*3/uL (ref 4.0–10.5)
nRBC: 0 % (ref 0.0–0.2)

## 2021-08-27 LAB — BASIC METABOLIC PANEL
Anion gap: 7 (ref 5–15)
BUN: 14 mg/dL (ref 8–23)
CO2: 26 mmol/L (ref 22–32)
Calcium: 8.4 mg/dL — ABNORMAL LOW (ref 8.9–10.3)
Chloride: 109 mmol/L (ref 98–111)
Creatinine, Ser: 1.04 mg/dL — ABNORMAL HIGH (ref 0.44–1.00)
GFR, Estimated: 49 mL/min — ABNORMAL LOW (ref >60)
Glucose, Bld: 91 mg/dL (ref 70–99)
Potassium: 4.1 mmol/L (ref 3.5–5.1)
Sodium: 142 mmol/L (ref 135–145)

## 2021-08-27 MED ORDER — METOPROLOL TARTRATE 25 MG PO TABS: 12.5000 mg | ORAL_TABLET | Freq: Two times a day (BID) | ORAL | 0 refills | Status: DC

## 2021-08-27 MED ORDER — METOPROLOL TARTRATE 25 MG PO TABS: 25.0000 mg | ORAL_TABLET | Freq: Two times a day (BID) | ORAL | 0 refills | Status: DC

## 2021-08-27 NOTE — Evaluation (Signed)
Physical Therapy Evaluation Patient Details Name: Bianca [name] MRN: [id number] DOB: 1923-03-13 Today's Date: 08/27/2021  History of Present Illness  88 y.o. female presents to [facility] hospital on 08/26/2021 with chest pain. Pt bradycardic and then tachycardic en route to hospital from ALF. Chest x-ray shows mild congestive heart failure with small right pleural effusion. PMH includes PSVT, PAF, HTN, HLD, CVA, dementia.  Clinical Impression  Pt presents to PT with deficits in endurance, cardiopulmonary function, balance, gait, power. PT anticipates the pt is likely not far from her baseline based on other recent PT evaluations from recent admissions, pt ambulates with staff assistance, utilizing a walker. Pt demonstrates the ability to mobilize without significant balance deviations, although she does report a fear of falling multiple times during this session. Pt will benefit from continued acute PT follow up in an effort to maintain her current level of function during this admission. Pt recommends return to ALF when medically ready.       Recommendations for follow up therapy are one component of a multi-disciplinary discharge planning process, led by the attending physician.  Recommendations may be updated based on patient status, additional functional criteria and insurance authorization.  Follow Up Recommendations No PT follow up (pt likely near baseline)    Assistance Recommended at Discharge Frequent or constant Supervision/Assistance  Patient can return home with the following  A little help with walking and/or transfers;A little help with bathing/dressing/bathroom;Direct supervision/assist for medications management;Direct supervision/assist for financial management;Assist for transportation;Help with stairs or ramp for entrance    Equipment Recommendations None recommended by PT  Recommendations for Other Services       Functional Status Assessment Patient has had a recent decline in  their functional status and demonstrates the ability to make significant improvements in function in a reasonable and predictable amount of time.     Precautions / Restrictions Precautions Precautions: Fall Precaution Comments: pt reports a fear of falling Restrictions Weight Bearing Restrictions: No      Mobility  Bed Mobility Overal bed mobility: Needs Assistance Bed Mobility: Supine to Sit, Sit to Supine     Supine to sit: Modified independent (Device/Increase time) Sit to supine: Modified independent (Device/Increase time)   General bed mobility comments: increased time    Transfers Overall transfer level: Needs assistance Equipment used: Rolling walker (2 wheels) Transfers: Sit to/from Stand Sit to Stand: Supervision                Ambulation/Gait Ambulation/Gait assistance: Min guard Gait Distance (Feet): 50 Feet Assistive device: Rolling walker (2 wheels) Gait Pattern/deviations: Step-through pattern   Gait velocity interpretation: <1.31 ft/sec, indicative of household ambulator   General Gait Details: pt with slowed step-through gait, pt picking up walker to navigate around Advertising copywriter    Modified Rankin (Stroke Patients Only)       Balance Overall balance assessment: Needs assistance Sitting-balance support: No upper extremity supported, Feet supported Sitting balance-Leahy Scale: Fair     Standing balance support: Single extremity supported, Bilateral upper extremity supported, Reliant on assistive device for balance Standing balance-Leahy Scale: Poor Standing balance comment: BUE support on RW                             Pertinent Vitals/Pain Pain Assessment Pain Assessment: No/denies pain    Home Living Family/patient expects to be discharged to:: Assisted living  Available Help at Discharge: Available 24 hours/day (staff) Type of Home: Assisted living           Home  Equipment:  (pt is unable to report, previous noted indicate the pt has a 4 wheeled walker)      Prior Function Prior Level of Function : Needs assist             Mobility Comments: pt mobilizing with use of rollator, reports people are there to assist with almost all activities ADLs Comments: staff assist with ADLs     Hand Dominance        Extremity/Trunk Assessment   Upper Extremity Assessment Upper Extremity Assessment: Generalized weakness    Lower Extremity Assessment Lower Extremity Assessment: Generalized weakness    Cervical / Trunk Assessment Cervical / Trunk Assessment: Kyphotic  Communication   Communication: No difficulties  Cognition Arousal/Alertness: Awake/alert Behavior During Therapy: WFL for tasks assessed/performed Overall Cognitive Status: History of cognitive impairments - at baseline                                 General Comments: history of dementia, pt is only oriented to person. Pt follows one-step commands well        General Comments General comments (skin integrity, edema, etc.): tachy into 120s at rest, up to high 130s with mobility    Exercises     Assessment/Plan    PT Assessment Patient needs continued PT services  PT Problem List Decreased strength;Decreased activity tolerance;Decreased mobility;Decreased cognition;Cardiopulmonary status limiting activity       PT Treatment Interventions DME instruction;Gait training;Functional mobility training;Therapeutic activities;Therapeutic exercise;Balance training;Patient/family education    PT Goals (Current goals can be found in the Care Plan section)  Acute Rehab PT Goals Patient Stated Goal: return to ALF PT Goal Formulation: With patient Time For Goal Achievement: 09/10/21 Potential to Achieve Goals: Good    Frequency Min 2X/week     Co-evaluation               AM-PAC PT "6 Clicks" Mobility  Outcome Measure Help needed turning from your back to  your side while in a flat bed without using bedrails?: None Help needed moving from lying on your back to sitting on the side of a flat bed without using bedrails?: None Help needed moving to and from a bed to a chair (including a wheelchair)?: A Little Help needed standing up from a chair using your arms (e.g., wheelchair or bedside chair)?: A Little Help needed to walk in hospital room?: A Little Help needed climbing 3-5 steps with a railing? : Total 6 Click Score: 18    End of Session   Activity Tolerance: Patient tolerated treatment well Patient left: in bed;with call bell/phone within reach;with bed alarm set Nurse Communication: Mobility status PT Visit Diagnosis: Other abnormalities of gait and mobility (R26.89);History of falling (Z91.81)    Time: 4270-6237 PT Time Calculation (min) (ACUTE ONLY): 14 min   Charges:   PT Evaluation $PT Eval Low Complexity: 1 Low          Zenaida Niece, PT, DPT Acute Rehabilitation Pager: [phone] Office [phone]   Zenaida Niece 08/27/2021, 12:37 PM

## 2021-08-27 NOTE — TOC Transition Note (Signed)
Transition of Care [facility]) - CM/SW Discharge Note   Patient Details  Name: Bianca [name] MRN: [id number] Date of Birth: [birth year]  [facility] CM/SW Contact:  Bary Castilla, LCSW Phone Number: 08/27/2021, 11:25 AM   Clinical Narrative:     Patient will DC to:?[place]  Anticipated DC date:?08/27/2021 Family notified:?[place] Transport by: [facility]    CSW spoke with pt's Warden Fillers and he confirmed that she was a resident of [place]. Per MD patient ready for DC to [facility] RN, patient, patient's family, and facility notified of DC. Discharge Summary and FL2 sent to facility by pt. RN given number for report  [phone]. DC packet on chart. Ambulance transport requested for patient.   CSW signing off.   [place], [place] [phone]        Patient Goals and CMS Choice        Discharge Placement                       Discharge Plan and Services                                     Social Determinants of Health (SDOH) Interventions     Readmission Risk Interventions    01/11/2020   10:45 AM  Readmission Risk Prevention Plan  Transportation Screening Complete  PCP or Specialist Appt within 5-7 Days Complete  Home Care Screening Complete  Medication Review (RN CM) Referral to Pharmacy

## 2021-08-27 NOTE — Social Work (Addendum)
CSW was alerted that pt was medically ready for DC today. CSW called [place] however had to leave voice mail. CSW will follow back up shortly.  11:00AM- Spoke with facility and pt can return(Bianca [name]) with Fl2 and DC summary sent by patient.

## 2021-08-27 NOTE — Progress Notes (Signed)
Patient continues to have heart rate that is as low as 30s at times and then 120s SVT on monitor.  Patient denies SOB, chest pain or dizziness.

## 2021-08-27 NOTE — Progress Notes (Signed)
Daily Progress Note   Patient Name: Bianca [name]       Date: 08/27/2021 DOB: 1923-03-17  Age: 88 y.o. MRN#: [id number] Attending Physician: Darliss Cheney, MD Primary Care Physician: Virgie Dad, MD Admit Date: 08/26/2021  Reason for Consultation/Follow-up: Establishing goals of care  Subjective: Medical records reviewed including progress notes, labs. Patient assessed at the bedside. She is resting comfortably, did not attempt to arouse Discussed with RN. Patient continues to have asymptomatic fluctuations in HR and slept well overnight.   Called patient's nephew/HCPOA Bianca [name] to provide updates and support. He agrees palliative care continues to be appropriate and would like to go with [facility] for referral.   Questions and concerns addressed. PMT will continue to support holistically.   Length of Stay: 0   Physical Exam Vitals and nursing note reviewed.  Constitutional:      General: She is sleeping. She is not in acute distress. Cardiovascular:     Rate and Rhythm: Normal rate.  Pulmonary:     Effort: Pulmonary effort is normal. No respiratory distress.            Vital Signs: BP (!) 103/92 (BP Location: Right Arm)   Pulse 74   Temp 98 F (36.7 C) (Oral)   Resp 16   Ht '4\' 10"'$  (1.473 m)   Wt 52.1 kg   SpO2 97%   BMI 24.01 kg/m  SpO2: SpO2: 97 % O2 Device: O2 Device: Room Air O2 Flow Rate: O2 Flow Rate (L/min): 2 L/min      Palliative Assessment/Data: 50%      Palliative Care Assessment & Plan   Patient Profile: 88 y.o. female  with past medical history of  PAF on Eliquis, PSVT refractory, chronic diastolic CHF, stroke, macular degeneration, severe dementia, admitted on 08/26/2021 with chest pain.   Patient has not complained of chest pain to EMS or ED. EKG  showed junctional tachycardia with prolonged QTc.  Patient's primary cardiologist Dr. Virgina Jock was consulted who recommended 25 mg of p.o. metoprolol. Patient is asymptomatic.    PMT has been consulted to assist with goals of care conversation.  Assessment: Goals of care conversation Dementia PSVT  Recommendations/Plan: DNR Patient is appropriate for palliative care services with uncertain prognosis, dementia, advanced age, will reach out to [facility] per HCPOA Return to ALF today  with outpatient palliative care follow up PMT remains available for acute needs  Prognosis:  Unable to determine  Discharge Planning: ALF with palliative care  Care plan was discussed with patient's nephew Bianca [name]    Total time: I spent 25 minutes in the care of the patient today in the above activities and documenting the encounter.          Deion Forgue Johnnette Litter, PA-C  Palliative Medicine Team Team phone # [phone]  Thank you for allowing the Palliative Medicine Team to assist in the care of this patient. Please utilize secure chat with additional questions, if there is no response within 30 minutes please call the above phone number.  Palliative Medicine Team providers are available by phone from 7am to 7pm daily and can be reached through the team cell phone.  Should this patient require assistance outside of these hours, please call the patient's attending physician.

## 2021-08-27 NOTE — Progress Notes (Signed)
Attempted to call report to [facility] multiple times, left message on answering machine for [facility] unit [phone] to call me back for report. Called general number at [facility]  and transferred to [employer] at [facility]. Report given. Patient awaiting PTAR for transfer.

## 2021-08-27 NOTE — Progress Notes (Signed)
Reviewed telemetry. Occasional SVT into 130s, otherwise resting heart rate in sinus rhythm predominantly in 40s. Recommend metoprolol tartrate at lower dose of 12.5 mg bid on discharge. Not a candidate for any other antiarrhythmic agents or EP study.   Nigel Mormon, MD Pager: [phone] Office: [phone]

## 2021-08-27 NOTE — Progress Notes (Signed)
TAKE these medications     acetaminophen 325 MG tablet Commonly known as: TYLENOL Take 650 mg by mouth every 6 (six) hours as needed for headache (pain).    apixaban 2.5 MG Tabs tablet Commonly known as: ELIQUIS Take 1 tablet (2.5 mg total) by mouth 2 (two) times daily.    atorvastatin 10 MG tablet Commonly known as: LIPITOR Take 5 mg by mouth daily.    B COMPLEX VITAMINS (W/ FA) PO Take 1 tablet by mouth daily. Vitamin B complex - Folic Acid 0.4 mg    bismuth subsalicylate 737 TG/62IR suspension Commonly known as: PEPTO BISMOL Take 60 mLs by mouth daily as needed for indigestion or diarrhea or loose stools.    Calcium Carbonate-Vitamin D 600-400 MG-UNIT tablet Take 1 tablet by mouth daily.    lactose free nutrition Liqd Take 237 mLs by mouth daily.    magic mouthwash Soln Take 5 mLs by mouth in the morning, at noon, in the evening, and at bedtime. Swish and spit    melatonin 3 MG Tabs tablet Take 3 mg by mouth at bedtime.    metoprolol tartrate 25 MG tablet Commonly known as: LOPRESSOR Take 0.5 tablets (12.5 mg total) by mouth 2 (two) times daily. What changed:  how much to take when to take this    MULTIPLE VITAMIN-FOLIC ACID PO Take 1 tablet by mouth daily.    omeprazole 20 MG capsule Commonly known as: PRILOSEC Take 20 mg by mouth daily.    PreviDent 5000 Booster Plus 1.1 % Pste Generic drug: Sodium Fluoride Place 1 application. onto teeth at bedtime.    timolol 0.5 % ophthalmic solution Commonly known as: TIMOPTIC Place 1 drop into both eyes daily.    triamcinolone ointment 0.1 % Commonly known as: KENALOG Apply 1 application. topically 2 (two) times daily.

## 2021-08-27 NOTE — Discharge Summary (Signed)
\ PatientPhysician Discharge Summary  Bianca [name] JOI:[id number] DOB: 1923-01-06 DOA: 08/26/2021  PCP: Virgie Dad, MD  Admit date: 08/26/2021 Discharge date: 08/27/2021 30 Day Unplanned Readmission Risk Score    Flowsheet Row ED to Hosp-Admission (Discharged) from 08/13/2021 in [facility]  30 Day Unplanned Readmission Risk Score (%) 11.1 Filed at 08/16/2021 1200       This score is the patient's risk of an unplanned readmission within 30 days of being discharged (0 -100%). The score is based on dignosis, age, lab data, medications, orders, and past utilization.   Low:  0-14.9   Medium: 15-21.9   High: 22-29.9   Extreme: 30 and above          Admitted From: ALF Disposition: ALF  Recommendations for Outpatient Follow-up:  Follow up with PCP in 1-2 weeks Please obtain BMP/CBC in one week Please follow up with your PCP on the following pending results: Unresulted Labs (From admission, onward)    None         Home Health: None Equipment/Devices: None  Discharge Condition: Stable CODE STATUS: DNR Diet recommendation: Cardiac  Subjective: Seen and examined.  Patient alert and oriented to self, at her baseline.  She has no complaints.  No chest pain.  Brief/Interim Summary: Bianca [name] is a 88 y.o. female with medical history significant of PAF on Eliquis, PSVT refractory, chronic diastolic CHF, stroke, macular degeneration was sent to the emergency department with chest pain.  Patient has severe dementia, she is only alert and oriented to self.  Unable to provide any meaningful history.  History obtained from the chart review and ER physician.  Per reports, when she was assessed by her primary nurse at assisted living facility today, patient had mentioned some chest discomfort.  EMS was called.  Upon EMS arrival, patient was bradycardic and then in route she became tachycardic.  Patient had not complained of any chest pain to the EMS or to the ED  physician.   ED Course: Upon arrival to ED, patient was tachycardic but blood pressure was slightly elevated diastolic.  Despite of this, she had no complaints.  EKG showed junctional tachycardia with prolonged QTc.  Patient's primary cardiologist Dr. Virgina Jock was consulted who recommended 25 mg of p.o. metoprolol.  Patient was admitted under hospitalist service.  Hospitalization summary: Patient was observed overnight.  She was ruled out of ACS, negative cardiac enzymes.  She remained stable.  No chest pain that she ever mention to anyone during this brief hospitalization.  I discussed with her cardiologist Dr. Virgina Jock this morning who reviewed her telemetry and according to his note, there were occasional SVTs in the 130s, otherwise resting heart rate in sinus rhythm predominantly in the 40s and he recommended reducing her metoprolol from 25 mg twice daily to 12.5 mg twice daily and according to his note, she is not a candidate for any EP study or antiarrhythmic agents and that patient can be discharged.  Patient is going to be discharged in stable condition.  I called and updated patient's nephew Mr. [name] who had no further questions for me.  Discharge Diagnoses:  Principal Problem:   Chest pain Active Problems:   Paroxysmal SVT (supraventricular tachycardia) (HCC)   GERD (gastroesophageal reflux disease)   Vascular dementia (HCC)   Paroxysmal atrial flutter (HCC)   Junctional tachycardia [facility])    Discharge Instructions  Discharge Instructions     Amb referral to [facility]   Complete by:  As directed       Allergies as of 08/27/2021       Reactions   Alphagan P [brimonidine Tartrate] Other (See Comments)   Unknown reaction Listed on MAR   Azopt [brinzolamide] Other (See Comments)   Unknown reaction Listed on MAR   Lumigan [bimatoprost] Other (See Comments)   Unknown reaction Listed on MAR        Medication List     TAKE these medications    acetaminophen  325 MG tablet Commonly known as: TYLENOL Take 650 mg by mouth every 6 (six) hours as needed for headache (pain).   apixaban 2.5 MG Tabs tablet Commonly known as: ELIQUIS Take 1 tablet (2.5 mg total) by mouth 2 (two) times daily.   atorvastatin 10 MG tablet Commonly known as: LIPITOR Take 5 mg by mouth daily.   B COMPLEX VITAMINS (W/ FA) PO Take 1 tablet by mouth daily. Vitamin B complex - Folic Acid 0.4 mg   bismuth subsalicylate 619 JK/93OI suspension Commonly known as: PEPTO BISMOL Take 60 mLs by mouth daily as needed for indigestion or diarrhea or loose stools.   Calcium Carbonate-Vitamin D 600-400 MG-UNIT tablet Take 1 tablet by mouth daily.   lactose free nutrition Liqd Take 237 mLs by mouth daily.   magic mouthwash Soln Take 5 mLs by mouth in the morning, at noon, in the evening, and at bedtime. Swish and spit   melatonin 3 MG Tabs tablet Take 3 mg by mouth at bedtime.   metoprolol tartrate 25 MG tablet Commonly known as: LOPRESSOR Take 0.5 tablets (12.5 mg total) by mouth 2 (two) times daily. What changed:  how much to take when to take this   MULTIPLE VITAMIN-FOLIC ACID PO Take 1 tablet by mouth daily.   omeprazole 20 MG capsule Commonly known as: PRILOSEC Take 20 mg by mouth daily.   PreviDent 5000 Booster Plus 1.1 % Pste Generic drug: Sodium Fluoride Place 1 application. onto teeth at bedtime.   timolol 0.5 % ophthalmic solution Commonly known as: TIMOPTIC Place 1 drop into both eyes daily.   triamcinolone ointment 0.1 % Commonly known as: KENALOG Apply 1 application. topically 2 (two) times daily.        Follow-up Information     Virgie Dad, MD Follow up in 1 week(s).   Specialty: Internal Medicine Contact information: [place] [address] [phone]                Allergies  Allergen Reactions   Alphagan P [Brimonidine Tartrate] Other (See Comments)    Unknown reaction Listed on MAR   Azopt  [Brinzolamide] Other (See Comments)    Unknown reaction Listed on MAR   Lumigan [Bimatoprost] Other (See Comments)    Unknown reaction Listed on [facility]    Consultations: Cardiology   Procedures/Studies: DG Chest 2 View  Result Date: 08/26/2021 CLINICAL DATA:  Chest pain.  Bradycardia. EXAM: CHEST - 2 VIEW COMPARISON:  08/13/2021 FINDINGS: Moderate cardiomegaly remains stable. New small right pleural effusion. Increased diffuse interstitial prominence suspicious for mild pulmonary edema. No evidence of pulmonary consolidation. IMPRESSION: Mild congestive heart failure with small right pleural effusion. Electronically Signed   By: Marlaine Hind M.D.   On: 08/26/2021 10:14   CT CHEST WO CONTRAST  Result Date: 08/13/2021 CLINICAL DATA:  Pneumonia suspected on chest x-ray. EXAM: CT CHEST WITHOUT CONTRAST TECHNIQUE: Multidetector CT imaging of the chest was performed following the standard protocol without IV contrast. RADIATION DOSE REDUCTION:  This exam was performed according to the departmental dose-optimization program which includes automated exposure control, adjustment of the mA and/or kV according to patient size and/or use of iterative reconstruction technique. COMPARISON:  Aug 13, 2020 chest x-ray FINDINGS: Cardiovascular: Mild calcified atherosclerotic change in the nonaneurysmal aorta. The central pulmonary arteries are normal in caliber. Calcified atherosclerotic changes are identified in the left coronary arteries. Cardiomegaly. Mediastinum/Nodes: The thyroid and esophagus are normal. Bilateral pleural effusions are relatively small. A small pericardial effusion is identified. Shotty nodes in the mediastinum are likely reactive without gross adenopathy. Chest wall is normal. Lungs/Pleura: Central airways are normal. No pneumothorax. Mild atelectasis is associated with the bilateral pleural effusions. No infiltrate identified to suggest pneumonia or aspiration. No overt edema. No mass or  suspicious nodule. Upper Abdomen: There is a 4.4 cm cyst in the left hepatic lobe. Musculoskeletal: No chest wall mass or suspicious bone lesions identified. IMPRESSION: 1. Small bilateral pleural effusions with underlying atelectasis. 2. Calcified atherosclerotic changes in the nonaneurysmal aorta in the left coronary arteries. 3. Cardiomegaly. 4. Small pericardial effusion. 5. No other significant abnormalities. Aortic Atherosclerosis (ICD10-I70.0). Electronically Signed   By: Dorise Bullion III M.D.   On: 08/13/2021 14:50   DG Chest Port 1 View  Result Date: 08/13/2021 CLINICAL DATA:  Tachycardia EXAM: PORTABLE CHEST 1 VIEW COMPARISON:  Chest x-ray dated November 02, 2019 FINDINGS: Cardiac and mediastinal contours are unchanged. New left basilar opacity. No large pleural effusion or pneumothorax. IMPRESSION: New mild left basilar opacity, differential considerations include atelectasis or infection/aspiration. Electronically Signed   By: Yetta Glassman M.D.   On: 08/13/2021 11:42     Discharge Exam: Vitals:   08/27/21 0554 08/27/21 0754  BP: 101/86 (!) 103/92  Pulse: (!) 52 74  Resp: (!) 83 16  Temp: 97.9 F (36.6 C) 98 F (36.7 C)  SpO2:  97%   Vitals:   08/27/21 0137 08/27/21 0554 08/27/21 0600 08/27/21 0754  BP: 129/67 101/86  (!) 103/92  Pulse: (!) 53 (!) 52  74  Resp: 18 (!) 83  16  Temp: 98 F (36.7 C) 97.9 F (36.6 C)  98 F (36.7 C)  TempSrc:  Oral  Oral  SpO2: 96%   97%  Weight:   52.1 kg   Height:        General: Pt is alert, awake, not in acute distress Cardiovascular: RRR, S1/S2 +, no rubs, no gallops Respiratory: CTA bilaterally, no wheezing, no rhonchi Abdominal: Soft, NT, ND, bowel sounds + Extremities: no edema, no cyanosis    The results of significant diagnostics from this hospitalization (including imaging, microbiology, ancillary and laboratory) are listed below for reference.     Microbiology: Recent Results (from the past 240 hour(s))  MRSA Next  Gen by PCR, Nasal     Status: Abnormal   Collection Time: 08/26/21  2:44 PM   Specimen: Nasal Mucosa; Nasal Swab  Result Value Ref Range Status   MRSA by PCR Next Gen DETECTED (A) NOT DETECTED Final    Comment: RESULT CALLED TO, READ BACK BY AND VERIFIED WITH: L,MITCHELL '@2020'$  08/26/21 EB (NOTE) The GeneXpert MRSA Assay (FDA approved for NASAL specimens only), is one component of a comprehensive MRSA colonization surveillance program. It is not intended to diagnose MRSA infection nor to guide or monitor treatment for MRSA infections. Test performance is not FDA approved in patients less than 84 years old. Performed at [facility], [place] [address], [place], [place] [address]  Labs: BNP (last 3 results) Recent Labs    08/26/21 0919  BNP 585.2*   Basic Metabolic Panel: Recent Labs  Lab 08/26/21 0919 08/26/21 1058 08/27/21 0404  NA 141  --  142  K 4.4  --  4.1  CL 111  --  109  CO2 19*  --  26  GLUCOSE 82  --  91  BUN 16  --  14  CREATININE 0.90  --  1.04*  CALCIUM 8.4*  --  8.4*  MG  --  1.4*  --    Liver Function Tests: Recent Labs  Lab 08/26/21 1058  AST 27  ALT 12  ALKPHOS 56  BILITOT 1.3*  PROT 3.9*  ALBUMIN 2.3*   No results for input(s): LIPASE, AMYLASE in the last 168 hours. No results for input(s): AMMONIA in the last 168 hours. CBC: Recent Labs  Lab 08/26/21 0919 08/27/21 0404  WBC 5.1 6.1  HGB 13.7 13.8  HCT 43.9 42.6  MCV 97.6 94.5  PLT 168 162   Cardiac Enzymes: No results for input(s): CKTOTAL, CKMB, CKMBINDEX, TROPONINI in the last 168 hours. BNP: Invalid input(s): POCBNP CBG: No results for input(s): GLUCAP in the last 168 hours. D-Dimer No results for input(s): DDIMER in the last 72 hours. Hgb A1c No results for input(s): HGBA1C in the last 72 hours. Lipid Profile No results for input(s): CHOL, HDL, LDLCALC, TRIG, CHOLHDL, LDLDIRECT in the last 72 hours. Thyroid function studies Recent Labs    08/26/21 0958  TSH  3.779   Anemia work up No results for input(s): VITAMINB12, FOLATE, FERRITIN, TIBC, IRON, RETICCTPCT in the last 72 hours. Urinalysis    Component Value Date/Time   COLORURINE YELLOW 01/10/2020 0624   APPEARANCEUR CLEAR 01/10/2020 0624   LABSPEC 1.016 01/10/2020 0624   PHURINE 5.0 01/10/2020 0624   GLUCOSEU NEGATIVE 01/10/2020 0624   HGBUR NEGATIVE 01/10/2020 0624   BILIRUBINUR NEGATIVE 01/10/2020 0624   KETONESUR NEGATIVE 01/10/2020 0624   PROTEINUR NEGATIVE 01/10/2020 0624   UROBILINOGEN 0.2 09/18/2009 1810   NITRITE NEGATIVE 01/10/2020 0624   LEUKOCYTESUR TRACE (A) 01/10/2020 0624   Sepsis Labs Invalid input(s): PROCALCITONIN,  WBC,  LACTICIDVEN Microbiology Recent Results (from the past 240 hour(s))  MRSA Next Gen by PCR, Nasal     Status: Abnormal   Collection Time: 08/26/21  2:44 PM   Specimen: Nasal Mucosa; Nasal Swab  Result Value Ref Range Status   MRSA by PCR Next Gen DETECTED (A) NOT DETECTED Final    Comment: RESULT CALLED TO, READ BACK BY AND VERIFIED WITH: L,MITCHELL '@2020'$  08/26/21 EB (NOTE) The GeneXpert MRSA Assay (FDA approved for NASAL specimens only), is one component of a comprehensive MRSA colonization surveillance program. It is not intended to diagnose MRSA infection nor to guide or monitor treatment for MRSA infections. Test performance is not FDA approved in patients less than 66 years old. Performed at [facility], [place] [address], [place], [place] [address]      Time coordinating discharge: Over 30 minutes  SIGNED:   Darliss Cheney, MD  [facility] 08/27/2021, 11:06 AM *Please note that this is a verbal dictation therefore any spelling or grammatical errors are due to the "[place] [employer]" system interpretation. If 7PM-7AM, please contact night-coverage [email or web address]

## 2021-08-27 NOTE — NC FL2 (Addendum)
[place] LEVEL OF CARE SCREENING TOOL     IDENTIFICATION  Patient Name: Bianca [name] Birthdate: Aug 21, 1922 Sex: female Admission Date (Current Location): 08/26/2021  [facility] and [place] Number:  Herbalist and Address:  [facility], [place] [address], [place],  [address]      Provider Number: [id number]  Attending Physician Name and Address:  Darliss Cheney, MD  Relative Name and Phone Number:  [name] [facility])   [phone]    Current Level of Care: Hospital Recommended Level of Care: [place] Prior Approval Number:    Date Approved/Denied:   PASRR Number: [id number]  Discharge Plan: Other (Comment) (ALF)    Current Diagnoses: Patient Active Problem List   Diagnosis Date Noted   Chest pain 08/26/2021   Junctional tachycardia ([place]) 08/13/2021   Soreness of tongue 08/03/2021   Delayed wound healing 02/17/2021   Cellulitis of right anterior lower leg 01/26/2021   Chest wall pain 01/19/2021   Left knee pain 01/11/2021   Weight gain 07/06/2020   Anemia, iron deficiency 01/22/2020   Syncope    Prolonged QT interval    Paroxysmal atrial flutter ([place])    Long term current use of anticoagulant    Mixed hyperlipidemia    History of stroke    Diastolic dysfunction    Fall 01/09/2020   Depression, recurrent ([place]) 11/07/2019   Stroke ([place]) 11/05/2019   Tachycardia 11/03/2019   Hypotension 11/03/2019   Sinus bradycardia 11/02/2019   Acute lower UTI 11/02/2019   Vascular dementia ([place]) 10/26/2019   CKD (chronic kidney disease) stage 3, GFR 30-59 ml/min (HCC) 09/30/2019   Edema 06/11/2019   Metatarsalgia 06/11/2019   GERD (gastroesophageal reflux disease) 05/14/2019   Paroxysmal SVT (supraventricular tachycardia) ([place]) 04/27/2019   Pneumonia due to COVID-19 virus 04/27/2019   Chronic diarrhea 04/27/2019   Urinary frequency 04/27/2019   Protein-calorie malnutrition ([place]) 04/27/2019    Emphysema of lung ([place]) 04/27/2019    Orientation RESPIRATION BLADDER Height & Weight     Self  Normal Continent Weight: 114 lb 14.4 oz (52.1 kg) Height:  '4\' 10"'$  (147.3 cm)  BEHAVIORAL SYMPTOMS/MOOD NEUROLOGICAL BOWEL NUTRITION STATUS      Continent Diet (See DC Summary)  AMBULATORY STATUS COMMUNICATION OF NEEDS Skin   Limited Assist Verbally Normal                       Personal Care Assistance Level of Assistance  Bathing, Feeding, Dressing Bathing Assistance: Limited assistance Feeding assistance: Independent Dressing Assistance: Limited assistance     Functional Limitations Info  Sight, Hearing, Speech Sight Info: Adequate Hearing Info: Impaired Speech Info: Adequate    SPECIAL CARE FACTORS FREQUENCY                       Contractures Contractures Info: Not present    Additional Factors Info  Code Status, Allergies Code Status Info: DNR Allergies Info: Alphagan P (Brimonidine Tartrate)   Azopt (Brinzolamide)   Lumigan (Bimatoprost)           Current Medications (08/27/2021):  This is the current hospital active medication list   Discharge Medications: Current Facility-Administered Medications  Medication Dose Route Frequency Provider Last Rate Last Admin   acetaminophen (TYLENOL) tablet 650 mg  650 mg Oral Q6H PRN Darliss Cheney, MD       apixaban (ELIQUIS) tablet 2.5 mg  2.5 mg Oral BID Darliss Cheney, MD   2.5  mg at 08/27/21 0924   atorvastatin (LIPITOR) tablet 5 mg  5 mg Oral Daily Pahwani, Einar Grad, MD   5 mg at 08/27/21 2703   bismuth subsalicylate (PEPTO BISMOL) 262 MG/15ML suspension 60 mL  60 mL Oral Daily PRN Darliss Cheney, MD       Chlorhexidine Gluconate Cloth 2 % PADS 6 each  6 each Topical Q0600 Darliss Cheney, MD   6 each at 08/27/21 0553   melatonin tablet 3 mg  3 mg Oral Q2000 Pahwani, Einar Grad, MD   3 mg at 08/26/21 2100   metoprolol tartrate (LOPRESSOR) tablet 25 mg  25 mg Oral BID Tegeler, Gwenyth Allegra, MD   25 mg at 08/27/21 0925    mupirocin ointment (BACTROBAN) 2 % 1 application.  1 application. Nasal BID Darliss Cheney, MD   1 application. at 08/27/21 0925   pantoprazole (PROTONIX) EC tablet 40 mg  40 mg Oral Daily Darliss Cheney, MD   40 mg at 08/27/21 5009     Relevant Imaging Results:  Relevant Lab Results:   Additional Information SS#[id number]; Moderna COVID-19 Vaccine 06/06/2019 , 05/09/2019  Moderna Covid-19 Booster Vaccine 09/06/2020 , 02/22/2020  Moderna Covid-19 vaccine Bivalent Booster 04/04/2021  Reece Agar, LCSWA

## 2021-08-27 NOTE — Progress Notes (Signed)
Paged Dr. Marcelle Smiling with cardiology about pt heart rate consistently in 40's. VS are stable see chart for details and pt remains asymptomatic. No complaints at this time. Will continue to monitor pt closely.

## 2021-08-31 ENCOUNTER — Ambulatory Visit: Payer: PRIVATE HEALTH INSURANCE | Admitting: Cardiology

## 2021-09-05 ENCOUNTER — Non-Acute Institutional Stay: Payer: Medicare Other | Admitting: Internal Medicine

## 2021-09-05 ENCOUNTER — Encounter: Payer: Self-pay | Admitting: Internal Medicine

## 2021-09-05 DIAGNOSIS — N1831 Chronic kidney disease, stage 3a: Secondary | ICD-10-CM

## 2021-09-05 DIAGNOSIS — E782 Mixed hyperlipidemia: Secondary | ICD-10-CM

## 2021-09-05 DIAGNOSIS — K219 Gastro-esophageal reflux disease without esophagitis: Secondary | ICD-10-CM | POA: Diagnosis not present

## 2021-09-05 DIAGNOSIS — I471 Supraventricular tachycardia: Secondary | ICD-10-CM | POA: Diagnosis not present

## 2021-09-05 DIAGNOSIS — I4892 Unspecified atrial flutter: Secondary | ICD-10-CM | POA: Diagnosis not present

## 2021-09-05 DIAGNOSIS — F01B0 Vascular dementia, moderate, without behavioral disturbance, psychotic disturbance, mood disturbance, and anxiety: Secondary | ICD-10-CM | POA: Diagnosis not present

## 2021-09-05 NOTE — Progress Notes (Signed)
Provider:  Veleta Miners MD Location:   [address] Room Number: 921 Place of Service:  ALF [phone]  PCP: Virgie Dad, MD Patient Care Team: Virgie Dad, MD as PCP - General (Internal Medicine)  Extended Emergency Contact Information Primary Emergency Contact: [place] Mobile Phone: [phone] Relation: [employer] Secondary Emergency Contact: [place] Mobile Phone: [phone] Relation: Friend  Code Status: DNR Goals of Care: Advanced Directive information    09/05/2021    4:09 PM  Advanced Directives  Does Patient Have a Medical Advance Directive? Yes  Type of Advance Directive Living will;Healthcare Power of Attorney  Does patient want to make changes to medical advance directive? No - Patient declined  Copy of [place] in Chart? Yes - validated most recent copy scanned in chart (See row information)      Chief Complaint  Patient presents with   Readmit To SNF    Readmission to [facility]    HPI: Patient is a 88 y.o. 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  Intolerant to Amiodarone due to Prolong QT and Recurent Falls  Continues to have issues with Tachy brady. 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 Does not remember anything No Chest pain or SOB   Past Medical History:  Diagnosis Date   Atrial flutter, paroxysmal (HCC)    COVID-19    Dementia (HCC)     Diastolic dysfunction    Hyperlipidemia    Hypertension    PSVT (paroxysmal supraventricular tachycardia) ([place])    Stroke ([place])    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 ([place])    History reviewed. No pertinent surgical history.  reports that she has never smoked. She has never used smokeless tobacco. She reports that she does not drink alcohol and does not use drugs. Social History   Socioeconomic History   Marital status: Widowed    Spouse name: Not on file   Number of children: 0   Years of education: Not on file   Highest education level: Not on file  Occupational History   Not on file  Tobacco Use   Smoking status: Never   Smokeless tobacco: Never  Vaping Use   Vaping Use: Never used  Substance and Sexual Activity   Alcohol use: Never   Drug use: Never   Sexual activity: Not on file  Other Topics Concern   Not on file  Social History Narrative   Not on file   Social Determinants of Health   Financial Resource Strain: Not on file  Food Insecurity: No Food Insecurity   Worried About Running Out of Food in the Last Year: Never true   Ran Out of Food in the Last Year: Never true  Transportation Needs: Not on file  Physical Activity: Not on file  Stress: Not on file  Social Connections: Not on file  Intimate Partner Violence: Not on file  Functional Status Survey:    History reviewed. No pertinent family history.  Health Maintenance  Topic Date Due   TETANUS/TDAP  Never done   Pneumonia Vaccine 24+ Years old (2 - PPSV23 if available, else PCV20) 10/18/2015   Zoster Vaccines- Shingrix (2 of 2) 07/19/2021   INFLUENZA VACCINE  11/07/2021   DEXA SCAN  Completed   COVID-19 Vaccine  Completed   HPV VACCINES  Aged Out    Allergies  Allergen Reactions   Alphagan P [Brimonidine Tartrate] Other (See Comments)    Unknown reaction Listed on MAR   Azopt [Brinzolamide] Other (See Comments)    Unknown  reaction Listed on MAR   Lumigan [Bimatoprost] Other (See Comments)    Unknown reaction Listed on MAR    Allergies as of 09/05/2021       Reactions   Alphagan P [brimonidine Tartrate] Other (See Comments)   Unknown reaction Listed on MAR   Azopt [brinzolamide] Other (See Comments)   Unknown reaction Listed on MAR   Lumigan [bimatoprost] Other (See Comments)   Unknown reaction Listed on [facility]        Medication List        Accurate as of Sep 05, 2021  4:09 PM. If you have any questions, ask your nurse or doctor.          acetaminophen 325 MG tablet Commonly known as: TYLENOL Take 650 mg by mouth every 6 (six) hours as needed for headache (pain).   apixaban 2.5 MG Tabs tablet Commonly known as: ELIQUIS Take 1 tablet (2.5 mg total) by mouth 2 (two) times daily.   atorvastatin 10 MG tablet Commonly known as: LIPITOR Take 5 mg by mouth daily.   B COMPLEX VITAMINS (W/ FA) PO Take 1 tablet by mouth daily. Vitamin B complex - Folic Acid 0.4 mg   Benzocaine-Menthol 20-0.26 % Gel Use as directed in the mouth or throat 3 (three) times daily as needed.   bismuth subsalicylate 503 TW/65KC suspension Commonly known as: PEPTO BISMOL Take 60 mLs by mouth daily as needed for indigestion or diarrhea or loose stools.   Calcium Carbonate-Vitamin D 600-400 MG-UNIT tablet Take 1 tablet by mouth daily.   lactose free nutrition Liqd Take 237 mLs by mouth daily.   magic mouthwash Soln Take 5 mLs by mouth in the morning, at noon, in the evening, and at bedtime. Swish and spit   melatonin 3 MG Tabs tablet Take 3 mg by mouth at bedtime.   metoprolol tartrate 25 MG tablet Commonly known as: LOPRESSOR Take 0.5 tablets (12.5 mg total) by mouth 2 (two) times daily.   Moisturizing Creme Crea Apply topically 2 (two) times daily.   MULTIPLE VITAMIN-FOLIC ACID PO Take 1 tablet by mouth daily.   omeprazole 20 MG capsule Commonly known as: PRILOSEC Take 20 mg by mouth daily.    PreviDent 5000 Booster Plus 1.1 % Pste Generic drug: Sodium Fluoride Place 1 application. onto teeth at bedtime.   timolol 0.5 % ophthalmic solution Commonly known as: TIMOPTIC Place 1 drop into both eyes daily.   triamcinolone ointment 0.1 % Commonly known as: KENALOG Apply 1 application. topically 2 (two) times daily.   zinc oxide 20 % ointment Apply 1 application. topically as needed for irritation.        Review of Systems  Constitutional:  Negative for activity change and appetite change.  HENT: Negative.    Respiratory:  Negative for cough and shortness of breath.   Cardiovascular:  Negative for  leg swelling.  Gastrointestinal:  Negative for constipation.  Genitourinary: Negative.   Musculoskeletal:  Positive for gait problem. Negative for arthralgias and myalgias.  Skin: Negative.   Neurological:  Positive for weakness. Negative for dizziness.  Psychiatric/Behavioral:  Positive for confusion. Negative for dysphoric mood and sleep disturbance.    Vitals:   09/05/21 1543  BP: 100/80  Pulse: (!) 160  Resp: 20  Temp: (!) 97.5 F (36.4 C)  SpO2: 93%  Weight: 112 lb 6.4 oz (51 kg)  Height: '4\' 5"'$  (1.346 m)   Body mass index is 28.13 kg/m. Physical Exam Vitals reviewed.  Constitutional:      Appearance: Normal appearance.  HENT:     Head: Normocephalic.     Nose: Nose normal.     Mouth/Throat:     Mouth: Mucous membranes are moist.     Pharynx: Oropharynx is clear.  Eyes:     Pupils: Pupils are equal, round, and reactive to light.  Cardiovascular:     Rate and Rhythm: Regular rhythm. Tachycardia present.     Pulses: Normal pulses.     Heart sounds: Normal heart sounds. No murmur heard. Pulmonary:     Effort: Pulmonary effort is normal.     Breath sounds: Normal breath sounds.  Abdominal:     General: Abdomen is flat. Bowel sounds are normal.     Palpations: Abdomen is soft.  Musculoskeletal:        General: No swelling.     Cervical back: Neck  supple.  Skin:    General: Skin is warm.  Neurological:     General: No focal deficit present.     Mental Status: She is alert.     Comments: Seems to have worsening Cognition  Psychiatric:        Mood and Affect: Mood normal.        Thought Content: Thought content normal.    Labs reviewed: Basic Metabolic Panel: Recent Labs    08/13/21 1336 08/13/21 1420 08/16/21 0421 08/26/21 0919 08/26/21 1058 08/27/21 0404  NA  --    < > 141 141  --  142  K  --    < > 4.0 4.4  --  4.1  CL  --    < > 113* 111  --  109  CO2  --    < > 23 19*  --  26  GLUCOSE  --    < > 84 82  --  91  BUN  --    < > 21 16  --  14  CREATININE  --    < > 0.94 0.90  --  1.04*  CALCIUM  --    < > 8.1* 8.4*  --  8.4*  MG 2.3  --   --   --  1.4*  --    < > = values in this interval not displayed.   Liver Function Tests: Recent Labs    06/08/21 0000 08/13/21 1027 08/26/21 1058  AST 23 37 27  ALT '17 25 12  '$ ALKPHOS 77 80 56  BILITOT  --  1.0 1.3*  PROT  --  6.0* 3.9*  ALBUMIN 3.6 3.5 2.3*   Recent Labs    08/13/21 1027  LIPASE 31   No results for input(s): AMMONIA in the last 8760 hours. CBC: Recent Labs    06/08/21 0000 06/08/21 0000 08/13/21 1027 08/13/21 1420 08/16/21 0421 08/26/21 0919 08/27/21 0404  WBC 6.2   < > 8.5  --  7.0 5.1 6.1  NEUTROABS 4,303.00  --  6.7  --  5.3  --   --   HGB 13.3  --  14.3   < > 12.2 13.7 13.8  HCT 41  --  46.1*   < > 38.0 43.9 42.6  MCV  --    < > 97.7  --  95.7 97.6 94.5  PLT 199  --  264  --  176 168 162   < > = values in this interval not displayed.   Cardiac Enzymes: No results for input(s): CKTOTAL, CKMB, CKMBINDEX, TROPONINI in the last 8760 hours. BNP: Invalid input(s): POCBNP Lab Results  Component Value Date   HGBA1C 5.9 (H) 11/04/2019   Lab Results  Component Value Date   TSH 3.779 08/26/2021   Lab Results  Component Value Date   XLKGMWNU27 253 01/21/2020   No results found for: FOLATE Lab Results  Component Value Date   IRON  44 01/21/2020   TIBC 273 01/21/2020   FERRITIN 63 01/21/2020    Imaging and Procedures obtained prior to SNF admission: DG Chest 2 View  Result Date: 08/26/2021 CLINICAL DATA:  Chest pain.  Bradycardia. EXAM: CHEST - 2 VIEW COMPARISON:  08/13/2021 FINDINGS: Moderate cardiomegaly remains stable. New small right pleural effusion. Increased diffuse interstitial prominence suspicious for mild pulmonary edema. No evidence of pulmonary consolidation. IMPRESSION: Mild congestive heart failure with small right pleural effusion. Electronically Signed   By: Marlaine Hind M.D.   On: 08/26/2021 10:14   ECHOCARDIOGRAM COMPLETE  Result Date: 08/27/2021    ECHOCARDIOGRAM REPORT   Patient Name:   Bianca [name] Date of Exam: 08/27/2021 Medical Rec #:  [id number]        Height:       58.0 in Accession #:    [id number]       Weight:       114.9 lb Date of Birth:  07-25-1922         BSA:          1.439 m Patient Age:    39 years         BP:           103/92 mmHg Patient Gender: F                HR:           124 bpm. Exam Location:  Inpatient Procedure: 2D Echo, Cardiac Doppler and Color Doppler Indications:    CHF  History:        Patient has prior history of Echocardiogram examinations, most                 recent 11/05/2019. Arrythmias:Tachycardia.  Sonographer:    Jefferey Pica Referring Phys: [id number] RAVI PAHWANI IMPRESSIONS  1. Left ventricular ejection fraction, by estimation, is 60 to 65%. The left ventricle has normal function. The left ventricle has no regional wall motion abnormalities. Indeterminate diastolic filling due to E-A fusion.  2. Right ventricular systolic function is moderately reduced. The right ventricular size is mildly enlarged. There is moderately elevated pulmonary artery systolic pressure.  3. Left atrial size was mildly dilated.  4. Right atrial size was mildly dilated.  5. The mitral valve is normal in structure. Trivial mitral valve regurgitation. No evidence of mitral stenosis.  6.  Tricuspid valve regurgitation is severe.  7. The aortic valve is tricuspid. Aortic valve regurgitation is mild. No aortic stenosis is present.  8. The inferior vena cava is  dilated in size with <50% respiratory variability, suggesting right atrial pressure of 15 mmHg. Comparison(s): No significant change from prior study. Prior images reviewed side by side. The right ventricular hypertrophy is worse. Systolic PA pressure is higher and tricuspid insufficiency is worse. FINDINGS  Left Ventricle: Left ventricular ejection fraction, by estimation, is 60 to 65%. The left ventricle has normal function. The left ventricle has no regional wall motion abnormalities. The left ventricular internal cavity size was normal in size. There is  no left ventricular hypertrophy. Indeterminate diastolic filling due to E-A fusion. Right Ventricle: The right ventricular size is mildly enlarged. No increase in right ventricular wall thickness. Right ventricular systolic function is moderately reduced. There is moderately elevated pulmonary artery systolic pressure. The tricuspid regurgitant velocity is 3.23 m/s, and with an assumed right atrial pressure of 15 mmHg, the estimated right ventricular systolic pressure is 16.1 mmHg. Left Atrium: Left atrial size was mildly dilated. Right Atrium: Right atrial size was mildly dilated. Pericardium: There is no evidence of pericardial effusion. Mitral Valve: The mitral valve is normal in structure. Trivial mitral valve regurgitation. No evidence of mitral valve stenosis. Tricuspid Valve: The tricuspid valve is normal in structure. Tricuspid valve regurgitation is severe. No evidence of tricuspid stenosis. Aortic Valve: The aortic valve is tricuspid. Aortic valve regurgitation is mild. No aortic stenosis is present. Aortic valve peak gradient measures 3.8 mmHg. Pulmonic Valve: The pulmonic valve was normal in structure. Pulmonic valve regurgitation is trivial. No evidence of pulmonic stenosis.  Aorta: The aortic root is normal in size and structure. Venous: The inferior vena cava is dilated in size with less than 50% respiratory variability, suggesting right atrial pressure of 15 mmHg. The inferior vena cava and the hepatic vein show a pattern of systolic flow reversal, suggestive of tricuspid regurgitation. IAS/Shunts: No atrial level shunt detected by color flow Doppler.  LEFT VENTRICLE PLAX 2D LVIDd:         2.70 cm LVIDs:         2.05 cm LV PW:         1.10 cm LV IVS:        1.10 cm LVOT diam:     1.80 cm LV SV:         32 LV SV Index:   22 LVOT Area:     2.54 cm  RIGHT VENTRICLE            IVC RV Basal diam:  4.10 cm    IVC diam: 2.20 cm RV Mid diam:    3.70 cm RV S prime:     6.39 cm/s LEFT ATRIUM             Index        RIGHT ATRIUM           Index LA diam:        3.90 cm 2.71 cm/m   RA Area:     18.20 cm LA Vol (A2C):   39.8 ml 27.66 ml/m  RA Volume:   53.00 ml  36.83 ml/m LA Vol (A4C):   34.9 ml 24.25 ml/m LA Biplane Vol: 38.6 ml 26.83 ml/m  AORTIC VALVE                 PULMONIC VALVE AV Area (Vmax): 2.00 cm     PV Vmax:       0.60 m/s AV Vmax:        97.70 cm/s   PV Peak grad:  1.4 mmHg  AV Peak Grad:   3.8 mmHg LVOT Vmax:      76.90 cm/s LVOT Vmean:     45.200 cm/s LVOT VTI:       0.125 m  AORTA Ao Root diam: 2.90 cm Ao Asc diam:  2.80 cm TRICUSPID VALVE TR Peak grad:   41.7 mmHg TR Vmax:        323.00 cm/s  SHUNTS Systemic VTI:  0.12 m Systemic Diam: 1.80 cm Dani Gobble Croitoru MD Electronically signed by Sanda Klein MD Signature Date/Time: 08/27/2021/12:15:15 PM    Final     Assessment/Plan 1. Paroxysmal SVT (supraventricular tachycardia) (HCC) Continues to have Tachybrady Will Change her holding order for lopressor  Do not hold unless less then 50 Also Write PRN Lopressor Per Cardiology not candidate for any other meds Palliative is involved  2. Paroxysmal atrial flutter (HCC) On Eliquis low dose  3. Moderate vascular dementia without behavioral disturbance, psychotic  disturbance, mood disturbance, or anxiety (HCC) Continues to have worsening cognition Do not think would be able to go back to [place]  4. Stage 3a chronic kidney disease (HCC) Creat stable  5. Mixed hyperlipidemia On low dose of Statin  6. Gastroesophageal reflux disease, unspecified whether esophagitis present On Protonix Continues to says she feels nauseated but eating well    Family/ staff Communication:   Labs/tests ordered:

## 2021-09-07 ENCOUNTER — Encounter: Payer: Self-pay | Admitting: Cardiology

## 2021-09-07 ENCOUNTER — Ambulatory Visit: Payer: Medicare Other | Admitting: Cardiology

## 2021-09-07 VITALS — BP 135/67 | HR 58 | Temp 97.8°F | Resp 18 | Ht <= 58 in | Wt 104.0 lb

## 2021-09-07 DIAGNOSIS — E782 Mixed hyperlipidemia: Secondary | ICD-10-CM

## 2021-09-07 DIAGNOSIS — Z7901 Long term (current) use of anticoagulants: Secondary | ICD-10-CM | POA: Diagnosis not present

## 2021-09-07 DIAGNOSIS — I4892 Unspecified atrial flutter: Secondary | ICD-10-CM | POA: Diagnosis not present

## 2021-09-07 DIAGNOSIS — I471 Supraventricular tachycardia: Secondary | ICD-10-CM | POA: Diagnosis not present

## 2021-09-07 NOTE — Progress Notes (Signed)
Bianca [name] Date of Birth: 01-16-1923 MRN: [id number] Primary Care Provider:Gupta, Rene Kocher, MD Former Cardiology Providers: Dr. Adrian [name] Primary Cardiologist: Bianca Kras, DO, [facility] (established care 02/05/2020)  Date: 09/07/21 Last Office Visit: 08/02/2021  Chief Complaint  Patient presents with   supraventricular tachycardia   Hospitalization Follow-up    HPI  Bianca [name] is a 88 y.o.  female whose past medical history and cardiovascular risk factors include: Paroxysmal SVT, paroxysmal atrial flutter, hyperlipidemia, grade 2 diastolic dysfunction, cognitive impairment suggestive of possible age-related dementia, history of stroke, postmenopausal female, advanced age.  Patient has a history of paroxysmal atrial flutter for which she was on amiodarone for rhythm control strategy but the medication was discontinued due to prolonged QT interval.  She has been on Eliquis for thromboembolic prophylaxis.  Since last office visit she has had multiple episodes of PSVT for which she has been hospitalized.  She has been evaluated by EP and during her hospitalization and given her advanced age, dementia, and frailty not a candidate for any invasive studies or catheter directed interventions.  They recommended continuing beta-blocker therapies for now.  Given her advanced age, dementia, patient's nephew Haskel Schroeder states that they plan to keep her in a nursing facility and reevaluate goals of care and may consider palliative/hospice when appropriate.  ALLERGIES: Allergies  Allergen Reactions   Alphagan P [Brimonidine Tartrate] Other (See Comments)    Unknown reaction Listed on MAR   Azopt [Brinzolamide] Other (See Comments)    Unknown reaction Listed on MAR   Lumigan [Bimatoprost] Other (See Comments)    Unknown reaction Listed on MAR   MEDICATION LIST PRIOR TO VISIT: Current Outpatient Medications on File Prior to Visit  Medication Sig Dispense Refill   acetaminophen  (TYLENOL) 325 MG tablet Take 650 mg by mouth every 6 (six) hours as needed for headache (pain).     apixaban (ELIQUIS) 2.5 MG TABS tablet Take 1 tablet (2.5 mg total) by mouth 2 (two) times daily. 60 tablet 1   atorvastatin (LIPITOR) 10 MG tablet Take 5 mg by mouth daily.     B Complex-Folic Acid (B COMPLEX VITAMINS, W/ FA, PO) Take 1 tablet by mouth daily. Vitamin B complex - Folic Acid 0.4 mg     bismuth subsalicylate (PEPTO BISMOL) 262 MG/15ML suspension Take 60 mLs by mouth daily as needed for indigestion or diarrhea or loose stools.      Calcium Carbonate-Vitamin D 600-400 MG-UNIT tablet Take 1 tablet by mouth daily.     lactose free nutrition (BOOST) LIQD Take 237 mLs by mouth daily.     magic mouthwash SOLN Take 5 mLs by mouth in the morning, at noon, in the evening, and at bedtime. Swish and spit     melatonin 3 MG TABS tablet Take 3 mg by mouth at bedtime.     metoprolol tartrate (LOPRESSOR) 25 MG tablet Take 0.5 tablets (12.5 mg total) by mouth 2 (two) times daily. 30 tablet 0   MULTIPLE VITAMIN-FOLIC ACID PO Take 1 tablet by mouth daily.     omeprazole (PRILOSEC) 20 MG capsule Take 20 mg by mouth daily.      Sodium Fluoride (PREVIDENT 5000 BOOSTER PLUS) 1.1 % PSTE Place 1 application. onto teeth at bedtime.     timolol (TIMOPTIC) 0.5 % ophthalmic solution Place 1 drop into both eyes daily.     triamcinolone ointment (KENALOG) 0.1 % Apply 1 application. topically 2 (two) times daily.     No current facility-administered  medications on file prior to visit.    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 No pertinent surgical  history.  FAMILY HISTORY: No family history of premature coronary disease or sudden cardiac death.   SOCIAL HISTORY:  The patient  reports that she has never smoked. She has never used smokeless tobacco. She reports that she does not drink alcohol and does not use drugs.  Review of Systems  Cardiovascular:  Negative for chest pain, dyspnea on exertion, leg swelling, palpitations and syncope.  Respiratory:  Negative for cough and shortness of breath.   Hematologic/Lymphatic: Negative for bleeding problem.  Neurological:        Baseline dementia.    PHYSICAL EXAM:    09/07/2021    3:41 PM 09/05/2021    3:43 PM 08/27/2021    7:54 AM  Vitals with BMI  Height '4\' 5"'$  '4\' 5"'$    Weight 104 lbs 112 lbs 6 oz   BMI 84.66 59.93   Systolic 570 177 939  Diastolic 67 80 92  Pulse 58 140 74    CONSTITUTIONAL: Elderly frail appearing female, uses a walker, hemodynamically stable, no acute distress.  SKIN: Skin is warm and dry. No rash noted. No cyanosis. No pallor. No jaundice HEAD: Normocephalic and atraumatic.  EYES: No scleral icterus MOUTH/THROAT: Moist oral membranes.  NECK: No JVD present. No thyromegaly noted. No carotid bruits  LYMPHATIC: No visible cervical adenopathy.  CHEST Normal respiratory effort. No intercostal retractions  LUNGS: Clear to auscultation bilaterally. No stridor. No wheezes. No rales.  CARDIOVASCULAR: Tachycardic, regular, positive Q3-E0, holosystolic murmur heard at the left sternal border, no gallops or rubs ABDOMINAL: soft, nontender, nondistended, positive bowel sounds all 4 quadrants. No apparent ascites.  EXTREMITIES: No peripheral edema. Faint DP and PT. skin changes consistent with chronic venous insufficiency HEMATOLOGIC: ecchymosis noted.  NEUROLOGIC: Oriented to person, place, and time. Nonfocal. Normal muscle tone.  PSYCHIATRIC: Normal mood and affect. Normal behavior. Cooperative  CARDIAC DATABASE: EKG: 02/01/2021: Normal sinus rhythm, 60 bpm, left  axis deviation, poor R wave progression, without underlying injury pattern.  QT 424 ms.  08/02/2021: Junctional tachycardia with retrograde P wave conduction, poor R wave progression.  Prior EKG notes normal sinus rhythm.  09/07/2021: Probable sinus bradycardia, 58 bpm, low voltage in the limb leads, poor R wave progression.  Echocardiogram: 10/27/2019:  1. Left ventricular ejection fraction, by estimation, is 60 to 65%. The  left ventricle has normal function. The left ventricle has no regional  wall motion abnormalities. There is mild left ventricular hypertrophy.  Left ventricular diastolic parameters  are consistent with Grade II diastolic dysfunction (pseudonormalization).  Elevated left atrial pressure.   2. Right ventricular systolic function is normal. The right ventricular  size is normal. There is mildly elevated pulmonary artery systolic  pressure. The estimated right ventricular systolic pressure is 92.3 mmHg.   3. Right atrial size was mildly dilated.   4. The mitral valve is normal in structure. Trivial mitral valve  regurgitation.   5. Tricuspid valve regurgitation is moderate to severe.   6. The aortic valve is tricuspid. Aortic valve regurgitation  is mild.  Mild aortic valve sclerosis is present, with no evidence of aortic valve stenosis.   7. The inferior vena cava is normal in size with greater than 50%  respiratory variability, suggesting right atrial pressure of 3 mmHg.    Carotid artery duplex  11/06/2019: Right Carotid: Velocities in the right ICA are consistent with a 1-39%  stenosis.  Left Carotid: Velocities in the left ICA are consistent with a 1-39%  stenosis.  Vertebrals: Bilateral vertebral arteries demonstrate antegrade flow.   LABORATORY DATA:    Latest Ref Rng & Units 08/27/2021    4:04 AM 08/26/2021    9:19 AM 08/16/2021    4:21 AM  CBC  WBC 4.0 - 10.5 K/uL 6.1  5.1  7.0   Hemoglobin 12.0 - 15.0 g/dL 13.8  13.7  12.2   Hematocrit 36.0 - 46.0 % 42.6  43.9   38.0   Platelets 150 - 400 K/uL 162  168  176        Latest Ref Rng & Units 08/27/2021    4:04 AM 08/26/2021   10:58 AM 08/26/2021    9:19 AM  CMP  Glucose 70 - 99 mg/dL 91   82   BUN 8 - 23 mg/dL 14   16   Creatinine 0.44 - 1.00 mg/dL 1.04   0.90   Sodium 135 - 145 mmol/L 142   141   Potassium 3.5 - 5.1 mmol/L 4.1   4.4   Chloride 98 - 111 mmol/L 109   111   CO2 22 - 32 mmol/L 26   19   Calcium 8.9 - 10.3 mg/dL 8.4   8.4   Total Protein 6.5 - 8.1 g/dL  3.9    Total Bilirubin 0.3 - 1.2 mg/dL  1.3    Alkaline Phos 38 - 126 U/L  56    AST 15 - 41 U/L  27    ALT 0 - 44 U/L  12      Lipid Panel     Component Value Date/Time   CHOL 78 06/08/2021 0000   TRIG 49 06/08/2021 0000   HDL 46 06/08/2021 0000   CHOLHDL 2.8 11/06/2019 0544   VLDL 17 11/06/2019 0544   LDLCALC 18 06/08/2021 0000    Lab Results  Component Value Date   HGBA1C 5.9 (H) 11/04/2019   No components found for: "NTPROBNP" Lab Results  Component Value Date   TSH 3.779 08/26/2021   TSH 5.842 (H) 08/13/2021   TSH 4.45 07/08/2020    Cardiac Panel (last 3 results) No results for input(s): "CKTOTAL", "CKMB", "TROPONINIHS", "RELINDX" in the last 72 hours.  IMPRESSION:    ICD-10-CM   1. Paroxysmal SVT (supraventricular tachycardia) (HCC)  I47.1 EKG 12-Lead    2. Paroxysmal atrial flutter (HCC)  I48.92     3. Long term current use of anticoagulant  Z79.01     4. Mixed hyperlipidemia  E78.2        RECOMMENDATIONS: AYRIEL TEXIDOR is a 88 y.o. female whose past medical history and cardiovascular risk factors include:  Paroxysmal SVT, paroxysmal atrial flutter, hyperlipidemia, grade 2 diastolic dysfunction, cognitive impairment suggestive of possible age-related dementia, history of stroke, postmenopausal female, advanced age.  Paroxysmal SVT (supraventricular tachycardia) (HCC) Patient has had multiple episodes of PSVT requiring either ER or hospitalization since last office visit. Given her age and  comorbid conditions medical therapy has been challenging. Recommend continuation of beta-blocker therapy. Would avoid AV nodal blocking agents such as digoxin given  her age, frailty, and the risk of dig toxicity. 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  She is not a candidate for EP study or catheter directed interventions for reasons mentioned above.  They to recommend medical therapy with Lopressor 12.5 mg p.o. twice daily with up titration as needed. I spoke to Dr. Lyndel Safe over the phone after today's office visit and have conveyed these recommendations as she is a primary provider at the patient's residence.  Dr. Lyndel Safe agrees with the same that medical therapy is quite appropriate and if she were to decompensate they may consider palliative/hospice as well.  Paroxysmal atrial flutter (HCC) Continue current AV nodal blocking agent. Currently on oral anticoagulation for thromboembolic prophylaxis. Monitor for now.  Long term current use of anticoagulant Indication: Paroxysmal atrial flutter. Does not endorse evidence of bleeding.  Mixed hyperlipidemia Currently on atorvastatin.   She denies myalgia or other side effects.  I have informed Dr. Lyndel Safe to feel free to reach out to me with regards to her cardiovascular care going forward.    FINAL MEDICATION LIST END OF ENCOUNTER: No orders of the defined types were placed in this encounter.   Medications Discontinued During This Encounter  Medication Reason   zinc oxide 20 % ointment    Emollient (MOISTURIZING CREME) CREA    Benzocaine-Menthol 20-0.26 % GEL      Current Outpatient Medications:    acetaminophen (TYLENOL) 325 MG tablet, Take 650 mg by mouth every 6 (six) hours as needed for headache (pain)., Disp: , Rfl:    apixaban (ELIQUIS) 2.5 MG TABS tablet, Take 1 tablet (2.5 mg total) by mouth 2 (two) times daily., Disp: 60 tablet, Rfl: 1    atorvastatin (LIPITOR) 10 MG tablet, Take 5 mg by mouth daily., Disp: , Rfl:    B Complex-Folic Acid (B COMPLEX VITAMINS, W/ FA, PO), Take 1 tablet by mouth daily. Vitamin B complex - Folic Acid 0.4 mg, Disp: , Rfl:    bismuth subsalicylate (PEPTO BISMOL) 262 MG/15ML suspension, Take 60 mLs by mouth daily as needed for indigestion or diarrhea or loose stools. , Disp: , Rfl:    Calcium Carbonate-Vitamin D 600-400 MG-UNIT tablet, Take 1 tablet by mouth daily., Disp: , Rfl:    lactose free nutrition (BOOST) LIQD, Take 237 mLs by mouth daily., Disp: , Rfl:    magic mouthwash SOLN, Take 5 mLs by mouth in the morning, at noon, in the evening, and at bedtime. Swish and spit, Disp: , Rfl:    melatonin 3 MG TABS tablet, Take 3 mg by mouth at bedtime., Disp: , Rfl:    metoprolol tartrate (LOPRESSOR) 25 MG tablet, Take 0.5 tablets (12.5 mg total) by mouth 2 (two) times daily., Disp: 30 tablet, Rfl: 0   MULTIPLE VITAMIN-FOLIC ACID PO, Take 1 tablet by mouth daily., Disp: , Rfl:    omeprazole (PRILOSEC) 20 MG capsule, Take 20 mg by mouth daily. , Disp: , Rfl:    Sodium Fluoride (PREVIDENT 5000 BOOSTER PLUS) 1.1 % PSTE, Place 1 application. onto teeth at bedtime., Disp: , Rfl:    timolol (TIMOPTIC) 0.5 % ophthalmic solution, Place 1 drop into both eyes daily., Disp: , Rfl:    triamcinolone ointment (KENALOG) 0.1 %, Apply 1 application. topically 2 (two) times daily., Disp: , Rfl:   Orders Placed This Encounter  Procedures   EKG 12-Lead   --Continue cardiac medications as reconciled in final medication  list. --Return if symptoms worsen or fail to improve. Or sooner if needed. --Continue follow-up with your primary care physician regarding the management of your other chronic comorbid conditions.  This note was created using a voice recognition software as a result there may be grammatical errors inadvertently enclosed that do not reflect the nature of this encounter. Every attempt is made to correct such  errors.  Bianca [name], [place], [facility]  Pager: [phone] Office: [phone]

## 2021-09-19 DIAGNOSIS — E785 Hyperlipidemia, unspecified: Secondary | ICD-10-CM | POA: Diagnosis not present

## 2021-09-19 LAB — LIPID PANEL
Cholesterol: 110 (ref 0–200)
HDL: 55 (ref 35–70)
LDL Cholesterol: 41
LDl/HDL Ratio: 2
Triglycerides: 56 (ref 40–160)

## 2021-09-25 ENCOUNTER — Non-Acute Institutional Stay: Payer: Medicare Other | Admitting: Family Medicine

## 2021-09-25 ENCOUNTER — Encounter: Payer: Self-pay | Admitting: Family Medicine

## 2021-09-25 VITALS — BP 120/66 | HR 59 | Resp 18

## 2021-09-25 DIAGNOSIS — R1010 Upper abdominal pain, unspecified: Secondary | ICD-10-CM

## 2021-09-25 DIAGNOSIS — Z7901 Long term (current) use of anticoagulants: Secondary | ICD-10-CM

## 2021-09-25 DIAGNOSIS — I4892 Unspecified atrial flutter: Secondary | ICD-10-CM | POA: Diagnosis not present

## 2021-09-25 NOTE — Progress Notes (Signed)
[facility] Palliative Care Consult Note Telephone: [phone]  Fax: [phone]   Date of encounter: 09/25/21 11:30 AM PATIENT NAME: Bianca [name] [address] [place] [address]   [phone] (home)  DOB: Mar 19, 1923 MRN: [id number] PRIMARY CARE PROVIDER:    Virgie Dad, MD,  [place] [address] [phone]  REFERRING PROVIDER:   Virgie Dad, MD [address] [place],  [place] [address] [phone]  RESPONSIBLE PARTY:    Contact Information     Name Relation Home Work Mobile   [place]   [phone]   Bianca [name]   [phone]   Bianca [name]   [phone]        I met face to face with patient and nephew Bianca [name] in her skilled nursing facility. Palliative Care was asked to follow this patient by consultation request of  Bianca Dad, MD to address advance care planning and complex medical decision making. This is the initial visit.          ASSESSMENT, SYMPTOM MANAGEMENT AND PLAN / RECOMMENDATIONS:    Upper abdominal pain Continue Omeprazole 20 mg daily. Question if possible esophageal spasm vs CP variant with pt a poor historian. No weight loss or bleeding.   Paroxysmal atrial flutter on long term anticoagulant therapy Currently slow rate at 59, controlled with no evidence of bleeding and no recent falls   Palliative Encounter Nephew, Bianca [name] is her [facility] POA Has DNR/MOST  Follow up Palliative Care Visit: Palliative care will continue to follow for complex medical decision making, advance care planning, and clarification of goals. Return 4 weeks or prn.    This visit was coded based on medical decision making (MDM).  PPS: 60%  HOSPICE ELIGIBILITY/DIAGNOSIS: TBD  Chief Complaint:  Bianca [name] received a referral to follow up with patient for chronic disease management in setting of vascular  dementia.  HISTORY OF PRESENT ILLNESS:  Bianca [name] is a 88 y.o. year old female with vascular dementia, Paroxysmal atrial flutter on long term anticoagulation, CKD stage 3, GERD, bradycardia, hx of hypotension, hx of stroke and emphysema, diastolic dysfunction, HLD, iron deficiency anemia and new c/o upper abdominal pain. Denies CP, SOB, falls, nausea/ vomiting or constipation.  Denies nosebleed or blood in stool.  Mood is good but states memory is bad.  She states not remembering her husband's name.  Food is good "but there is too much of it." Has pain across upper abdomen not change if she eats or doesn't eat and has been there "a while". Assistance with bathing and dressing. Spoke with Bianca [name], pt's nurse. She corroborates pt is eating well, has had no falls and requires assistance with bathing and dressing, that she is continent. She reports no c/o abd pain from the patient, states she had a recent CP episode and was noted to be in altered rhythm which at times was slow and at others was very rapid so her dose of Metoprolol was adjusted down to 12.5 mg BID and only to hold if her pulse rate was less than 50.  She said at times when not given her heart rate would reach 140s.  History obtained from review of EMR, discussion with nephew Bianca [name] who works on the facility grounds of [place], facility staff and/or Bianca [name].  I reviewed available labs, medications, imaging, studies and related documents from the EMR.  Records reviewed and summarized above.  ROS General: NAD ENMT: denies dysphagia Cardiovascular: denies chest pain, denies DOE Pulmonary: denies cough, denies increased SOB Abdomen: endorses good appetite, denies constipation, endorses continence of bowel GU: denies dysuria, endorses continence of urine MSK:  denies increased weakness, no falls reported Skin: denies rashes or wounds Neurological: endorses pain in upper abdomen, denies insomnia Psych: Endorses  positive mood and memory deficits Heme/lymph/immuno: denies bruises, abnormal bleeding  Physical Exam: Current and past weights: 104 lbs as of 09/07/21, 108 lbs 3.2 ounces on 05/30/21 Constitutional: NAD General: thin/WD ENMT: intact hearing, missing some upper dentition CV: S1S2, IRIR with slow rate, no LE edema Pulmonary: CTAB, no increased work of breathing, no cough, room air Abdomen: hypo-active BS + 4 quadrants, soft and non tender, no epigastric pain/tenderness GU: deferred MSK: moves all extremities, ambulatory with rollator present Skin: warm and dry Neuro:  no generalized weakness, noted memory deficits of names and family Psych: non-anxious affect, A and O x 2 Hem/lymph/immuno: no widespread bruising  CURRENT PROBLEM LIST:  Patient Active Problem List   Diagnosis Date Noted   Chest pain 08/26/2021   Junctional tachycardia ([place]) 08/13/2021   Soreness of tongue 08/03/2021   Delayed wound healing 02/17/2021   Cellulitis of right anterior lower leg 01/26/2021   Chest wall pain 01/19/2021   Left knee pain 01/11/2021   Weight gain 07/06/2020   Anemia, iron deficiency 01/22/2020   Syncope    Prolonged QT interval    Paroxysmal atrial flutter ([place])    Long term current use of anticoagulant    Mixed hyperlipidemia    History of stroke    Diastolic dysfunction    Fall 01/09/2020   Depression, recurrent ([place]) 11/07/2019   Stroke ([place]) 11/05/2019   Tachycardia 11/03/2019   Hypotension 11/03/2019   Sinus bradycardia 11/02/2019   Acute lower UTI 11/02/2019   Vascular dementia ([place]) 10/26/2019   CKD (chronic kidney disease) stage 3, GFR 30-59 ml/min (HCC) 09/30/2019   Edema 06/11/2019   Metatarsalgia 06/11/2019   GERD (gastroesophageal reflux disease) 05/14/2019   Paroxysmal SVT (supraventricular tachycardia) ([place]) 04/27/2019   Pneumonia due to COVID-19 virus 04/27/2019   Chronic diarrhea 04/27/2019   Urinary frequency 04/27/2019   Protein-calorie malnutrition ([place])  04/27/2019   Emphysema of lung ([place]) 04/27/2019   PAST MEDICAL HISTORY:  Active Ambulatory Problems    Diagnosis Date Noted   Paroxysmal SVT (supraventricular tachycardia) ([place]) 04/27/2019   Pneumonia due to COVID-19 virus 04/27/2019   Chronic diarrhea 04/27/2019   Urinary frequency 04/27/2019   Protein-calorie malnutrition ([place]) 04/27/2019   Emphysema of lung ([place]) 04/27/2019   GERD (gastroesophageal reflux disease) 05/14/2019   Edema 06/11/2019   Metatarsalgia 06/11/2019   CKD (chronic kidney disease) stage 3, GFR 30-59 ml/min (HCC) 09/30/2019   Vascular dementia ([place]) 10/26/2019   Sinus bradycardia 11/02/2019   Acute lower UTI 11/02/2019   Tachycardia 11/03/2019   Hypotension 11/03/2019   Stroke ([place]) 11/05/2019   Depression, recurrent ([place]) 11/07/2019   Fall 01/09/2020   Prolonged QT interval    Paroxysmal atrial flutter ([place])    Long term current use of anticoagulant    Mixed hyperlipidemia    History of stroke    Diastolic dysfunction    Syncope    Anemia, iron deficiency 01/22/2020   Weight gain 07/06/2020   Left knee pain 01/11/2021   Chest wall pain 01/19/2021   Cellulitis of right anterior lower leg 01/26/2021   Delayed wound healing 02/17/2021   Soreness of tongue 08/03/2021   Junctional tachycardia (  [place]) 08/13/2021   Chest pain 08/26/2021   Resolved Ambulatory Problems    Diagnosis Date Noted   Skin tear of right lower leg without complication 88/91/6945   Infected wound 07/27/2019   Altered mental status 11/02/2019   Past Medical History:  Diagnosis Date   Atrial flutter, paroxysmal (HCC)    COVID-19    Dementia (HCC)    Hyperlipidemia    Hypertension    PSVT (paroxysmal supraventricular tachycardia) (HCC)    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 ([place])    SOCIAL HX:  Social History   Tobacco Use   Smoking status: Never   Smokeless tobacco: Never  Substance Use Topics   Alcohol use: Never    FAMILY HX: sister with dementia who also resides in [place]   Preferred Pharmacy: ALLERGIES:  Allergies  Allergen Reactions   Alphagan P [Brimonidine Tartrate] Other (See Comments)    Unknown reaction Listed on MAR   Azopt [Brinzolamide] Other (See Comments)    Unknown reaction Listed on MAR   Lumigan [Bimatoprost] Other (See Comments)    Unknown reaction Listed on MAR     PERTINENT MEDICATIONS:  Outpatient Encounter Medications as of 09/25/2021  Medication Sig   acetaminophen (TYLENOL) 325 MG tablet Take 650 mg by mouth every 6 (six) hours as needed for headache (pain).   apixaban (ELIQUIS) 2.5 MG TABS tablet Take 1 tablet (2.5 mg total) by mouth 2 (two) times daily.   atorvastatin (LIPITOR) 10 MG tablet Take 5 mg by mouth daily.   B Complex-Folic Acid (B COMPLEX VITAMINS, W/ FA, PO) Take 1 tablet by mouth daily. Vitamin B complex - Folic Acid 0.4 mg   bismuth subsalicylate (PEPTO BISMOL) 262 MG/15ML suspension Take 60 mLs by mouth daily as needed for indigestion or diarrhea or loose stools.    Calcium Carbonate-Vitamin D 600-400 MG-UNIT tablet Take 1 tablet by mouth daily.   lactose free nutrition (BOOST) LIQD Take 237 mLs by mouth daily.   magic mouthwash SOLN Take 5 mLs by mouth in the morning, at noon, in the evening, and at bedtime. Swish and spit   melatonin 3 MG TABS tablet Take 3 mg by mouth at bedtime.   metoprolol tartrate (LOPRESSOR) 25 MG tablet Take 0.5 tablets (12.5 mg total) by mouth 2 (two) times daily.   MULTIPLE VITAMIN-FOLIC ACID PO Take 1 tablet by mouth daily.   omeprazole (PRILOSEC) 20 MG capsule Take 20 mg by mouth daily.    Sodium Fluoride (PREVIDENT 5000 BOOSTER PLUS) 1.1 % PSTE Place 1 application. onto teeth at bedtime.   timolol (TIMOPTIC) 0.5 % ophthalmic solution Place 1 drop into both eyes daily.   triamcinolone ointment (KENALOG) 0.1 % Apply 1 application. topically 2 (two) times daily.   No facility-administered encounter medications on  file as of 09/25/2021.     ------------------------------------------------------------------------------------------- Advance Care Planning/Goals of Care: Goals include to maximize quality of life and symptom management.   Experiences with loved ones who have been seriously ill or have died-Nephew Liliane Channel states his mother is with Hospice currently due to dementia  Exploration of personal, cultural or spiritual beliefs that might influence medical decisions  Exploration of goals of care in the event of a sudden injury or illness  Identification  of a healthcare agent-nephew Bianca [name] is [facility] POA Review of advance directive documents.-Has DNR and MOST Decision not to resuscitate or to de-escalate disease focused treatments due to poor prognosis. CODE STATUS: MOST as of 09/06/2021:  DNR/DNI with comfort measures Use of antibiotics and IV fluids on a case by case, time limited basis No feeding tube.    Thank you for the opportunity to participate in the care of Bianca [name].  The palliative care team will continue to follow. Please call our office at [phone] if we can be of additional assistance.   Marijo Conception, FNP-C  COVID-19 PATIENT SCREENING TOOL Asked and negative response unless otherwise noted:  Have you had symptoms of covid, tested positive or been in contact with someone with symptoms/positive test in the past 5-10 days? No

## 2021-09-26 DIAGNOSIS — G4700 Insomnia, unspecified: Secondary | ICD-10-CM | POA: Diagnosis not present

## 2021-09-26 DIAGNOSIS — F330 Major depressive disorder, recurrent, mild: Secondary | ICD-10-CM | POA: Diagnosis not present

## 2021-11-06 DIAGNOSIS — G4700 Insomnia, unspecified: Secondary | ICD-10-CM | POA: Diagnosis not present

## 2021-11-06 DIAGNOSIS — F330 Major depressive disorder, recurrent, mild: Secondary | ICD-10-CM | POA: Diagnosis not present

## 2021-11-07 ENCOUNTER — Encounter: Payer: Self-pay | Admitting: Internal Medicine

## 2021-11-07 ENCOUNTER — Non-Acute Institutional Stay (SKILLED_NURSING_FACILITY): Payer: Medicare Other | Admitting: Internal Medicine

## 2021-11-07 DIAGNOSIS — R195 Other fecal abnormalities: Secondary | ICD-10-CM | POA: Diagnosis not present

## 2021-11-07 DIAGNOSIS — R350 Frequency of micturition: Secondary | ICD-10-CM

## 2021-11-07 DIAGNOSIS — I4892 Unspecified atrial flutter: Secondary | ICD-10-CM | POA: Diagnosis not present

## 2021-11-07 DIAGNOSIS — F01B0 Vascular dementia, moderate, without behavioral disturbance, psychotic disturbance, mood disturbance, and anxiety: Secondary | ICD-10-CM | POA: Diagnosis not present

## 2021-11-07 NOTE — Progress Notes (Signed)
Location:    [facility] Room Number: 40 Place of Service:  SNF [phone]) Provider:  Veleta Miners MD  Virgie Dad, MD  Patient Care Team: Virgie Dad, MD as PCP - General (Internal Medicine)  Extended Emergency Contact Information Primary Emergency Contact: [place] Mobile Phone: [phone] Relation: Jack Secondary Emergency Contact: [place] Mobile Phone: [phone] Relation: Friend  Code Status:  DNR Goals of care: Advanced Directive information    09/05/2021    4:09 PM  Advanced Directives  Does Patient Have a Medical Advance Directive? Yes  Type of Advance Directive Living will;Healthcare Power of Attorney  Does patient want to make changes to medical advance directive? No - Patient declined  Copy of [place] in Chart? Yes - validated most recent copy scanned in chart (See row information)     Chief Complaint  Patient presents with   Acute Visit    HPI:  Pt is a 88 y.o. female seen today for an acute visit for ? Diarrhea Loose stool and Increase urinary frequency  Patient has a history of her LE edema, PSVT and Tachy brady syndrome  , GERD, IBS with diarrhea H/o Acute Infarct with Acute Encephalopathy  Intolerant to Amiodarone due to Prolong QT and Recurent Falls  Seen today as nurses last night noticed  her going to the bathroom with increased urinary frequency In today nurses noticed that patient was also having some loose stool Patient also complained of some upper abdominal pain. She denies any nausea or vomiting no fever denies any dysuria. 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 No pertinent surgical history.  Allergies  Allergen Reactions   Alphagan P [Brimonidine Tartrate] Other (See Comments)    Unknown reaction Listed on MAR   Azopt [Brinzolamide] Other (See Comments)    Unknown reaction Listed on MAR   Lumigan [Bimatoprost] Other (See Comments)    Unknown reaction Listed on MAR    Allergies as of 11/07/2021       Reactions   Alphagan P [brimonidine Tartrate] Other (See Comments)   Unknown reaction Listed on MAR   Azopt [brinzolamide] Other (See Comments)   Unknown reaction Listed on MAR   Lumigan [bimatoprost] Other (See Comments)   Unknown reaction Listed on [facility]        Medication List        Accurate as of November 07, 2021 10:32 AM. If you have any questions, ask your nurse or doctor.          acetaminophen 325 MG tablet Commonly known as: TYLENOL Take 650 mg by mouth every 6 (six) hours as needed for headache (pain).   apixaban 2.5 MG Tabs tablet Commonly known as: ELIQUIS Take 1 tablet (2.5 mg total) by mouth 2 (two) times daily.   atorvastatin 10 MG tablet Commonly known as: LIPITOR  Take 5 mg by mouth daily.   B COMPLEX VITAMINS (W/ FA) PO Take 1 tablet by mouth daily. Vitamin B complex - Folic Acid 0.4 mg   bismuth subsalicylate 921 JH/41DE suspension Commonly known as: PEPTO BISMOL Take 60 mLs by mouth daily as needed for indigestion or diarrhea or loose stools.   Calcium Carbonate-Vitamin D 600-400 MG-UNIT tablet Take 1 tablet by mouth daily.   lactose free nutrition Liqd Take 237 mLs by mouth daily.   magic mouthwash Soln Take 5 mLs by mouth in the morning, at noon, in the evening, and at bedtime. Swish and spit   melatonin 3 MG Tabs tablet Take 3 mg by mouth at  bedtime.   metoprolol tartrate 25 MG tablet Commonly known as: LOPRESSOR Take 12.5 mg by mouth as needed.   metoprolol tartrate 25 MG tablet Commonly known as: LOPRESSOR Take 0.5 tablets (12.5 mg total) by mouth 2 (two) times daily.   MULTIPLE VITAMIN-FOLIC ACID PO Take 1 tablet by mouth daily.   omeprazole 20 MG capsule Commonly known as: PRILOSEC Take 20 mg by mouth daily.   PreviDent 5000 Booster Plus 1.1 % Pste Generic drug: Sodium Fluoride Place 1 application. onto teeth at bedtime.   timolol 0.5 % ophthalmic solution Commonly known as: TIMOPTIC Place 1 drop into both eyes daily.   triamcinolone ointment 0.1 % Commonly known as: KENALOG Apply 1 application. topically 2 (two) times daily.        Review of Systems  Constitutional:  Negative for activity change and appetite change.  HENT: Negative.    Respiratory:  Negative for cough and shortness of breath.   Cardiovascular:  Negative for leg swelling.  Gastrointestinal:  Positive for diarrhea. Negative for constipation.  Genitourinary:  Positive for frequency.  Musculoskeletal:  Negative for arthralgias, gait problem and myalgias.  Skin: Negative.   Neurological:  Negative for dizziness and weakness.  Psychiatric/Behavioral:  Positive for confusion. Negative for dysphoric mood and sleep disturbance.     Immunization History  Administered Date(s) Administered   DTaP 09/05/2004   Influenza, High Dose Seasonal PF 01/06/2014, 11/08/2018   Influenza, Quadrivalent, Recombinant, Inj, Pf 01/09/2018, 01/21/2019   Influenza-Unspecified 11/07/2013, 01/14/2020, 01/26/2021   Moderna Covid-19 Vaccine Bivalent Booster 14yr & up 04/04/2021   Moderna SARS-COV2 Booster Vaccination 02/22/2020, 09/06/2020   Moderna Sars-Covid-2 Vaccination 05/09/2019, 06/06/2019   Pneumococcal Conjugate-13 01/06/2014   Pneumococcal-Unspecified 09/05/2004, 10/18/2014   Zoster Recombinat (Shingrix) 05/24/2021   Pertinent  Health Maintenance  Due  Topic Date Due   INFLUENZA VACCINE  11/07/2021   DEXA SCAN  Completed      08/15/2021    9:00 PM 08/26/2021    9:19 AM 08/26/2021    2:00 PM 08/26/2021    8:30 PM 08/27/2021   10:00 AM  Fall Risk  Patient Fall Risk Level High fall risk High fall risk High fall risk High fall risk High fall risk   Functional Status Survey:    Vitals:   11/07/21 1016  BP: 120/65  Pulse: (!) 58  Resp: 14  Temp: (!) 96.8 F (36 C)  SpO2: 95%  Weight: 104 lb 4.8 oz (47.3 kg)  Height: '4\' 10"'$  (1.473 m)   Body mass index is 21.8 kg/m. Physical Exam Vitals reviewed.  Constitutional:      Appearance: Normal appearance.  HENT:     Head: Normocephalic.     Nose: Nose normal.     Mouth/Throat:     Mouth: Mucous membranes are moist.  Pharynx: Oropharynx is clear.  Eyes:     Pupils: Pupils are equal, round, and reactive to light.  Cardiovascular:     Rate and Rhythm: Bradycardia present. Rhythm irregular.     Pulses: Normal pulses.     Heart sounds: Normal heart sounds. No murmur heard. Pulmonary:     Effort: Pulmonary effort is normal.     Breath sounds: Normal breath sounds.  Abdominal:     General: Abdomen is flat. Bowel sounds are normal.     Palpations: Abdomen is soft.  Musculoskeletal:        General: No swelling.     Cervical back: Neck supple.  Skin:    General: Skin is warm.  Neurological:     General: No focal deficit present.     Mental Status: She is alert.  Psychiatric:        Mood and Affect: Mood normal.        Thought Content: Thought content normal.     Labs reviewed: Recent Labs    08/13/21 1336 08/13/21 1420 08/16/21 0421 08/26/21 0919 08/26/21 1058 08/27/21 0404  NA  --    < > 141 141  --  142  K  --    < > 4.0 4.4  --  4.1  CL  --    < > 113* 111  --  109  CO2  --    < > 23 19*  --  26  GLUCOSE  --    < > 84 82  --  91  BUN  --    < > 21 16  --  14  CREATININE  --    < > 0.94 0.90  --  1.04*  CALCIUM  --    < > 8.1* 8.4*  --  8.4*  MG 2.3   --   --   --  1.4*  --    < > = values in this interval not displayed.   Recent Labs    06/08/21 0000 08/13/21 1027 08/26/21 1058  AST 23 37 27  ALT '17 25 12  '$ ALKPHOS 77 80 56  BILITOT  --  1.0 1.3*  PROT  --  6.0* 3.9*  ALBUMIN 3.6 3.5 2.3*   Recent Labs    06/08/21 0000 06/08/21 0000 08/13/21 1027 08/13/21 1420 08/16/21 0421 08/26/21 0919 08/27/21 0404  WBC 6.2   < > 8.5  --  7.0 5.1 6.1  NEUTROABS 4,303.00  --  6.7  --  5.3  --   --   HGB 13.3  --  14.3   < > 12.2 13.7 13.8  HCT 41  --  46.1*   < > 38.0 43.9 42.6  MCV  --    < > 97.7  --  95.7 97.6 94.5  PLT 199  --  264  --  176 168 162   < > = values in this interval not displayed.   Lab Results  Component Value Date   TSH 3.779 08/26/2021   Lab Results  Component Value Date   HGBA1C 5.9 (H) 11/04/2019   Lab Results  Component Value Date   CHOL 110 09/19/2021   HDL 55 09/19/2021   LDLCALC 41 09/19/2021   TRIG 56 09/19/2021   CHOLHDL 2.8 11/06/2019    Significant Diagnostic Results in last 30 days:  No results found.  Assessment/Plan 1. Urinary frequency No Dysuria Will continue to monitor for now  2. Loose stools Can  be related  to drinking Supplements Will continue to monitor for now   3. Paroxysmal atrial flutter (HCC) On Eliquis and Metoprolol  4. Moderate vascular dementia without behavioral disturbance, psychotic disturbance, mood disturbance, or anxiety ([place]) Now in SNF    Family/ staff Communication:   Labs/tests ordered:

## 2021-12-04 DIAGNOSIS — F330 Major depressive disorder, recurrent, mild: Secondary | ICD-10-CM | POA: Diagnosis not present

## 2021-12-04 DIAGNOSIS — G4700 Insomnia, unspecified: Secondary | ICD-10-CM | POA: Diagnosis not present

## 2021-12-08 ENCOUNTER — Non-Acute Institutional Stay (SKILLED_NURSING_FACILITY): Payer: Medicare Other | Admitting: Nurse Practitioner

## 2021-12-08 ENCOUNTER — Encounter: Payer: Self-pay | Admitting: Nurse Practitioner

## 2021-12-08 DIAGNOSIS — E782 Mixed hyperlipidemia: Secondary | ICD-10-CM | POA: Diagnosis not present

## 2021-12-08 DIAGNOSIS — I639 Cerebral infarction, unspecified: Secondary | ICD-10-CM

## 2021-12-08 DIAGNOSIS — I471 Supraventricular tachycardia: Secondary | ICD-10-CM | POA: Diagnosis not present

## 2021-12-08 DIAGNOSIS — I5189 Other ill-defined heart diseases: Secondary | ICD-10-CM | POA: Diagnosis not present

## 2021-12-08 DIAGNOSIS — F339 Major depressive disorder, recurrent, unspecified: Secondary | ICD-10-CM | POA: Diagnosis not present

## 2021-12-08 DIAGNOSIS — K219 Gastro-esophageal reflux disease without esophagitis: Secondary | ICD-10-CM | POA: Diagnosis not present

## 2021-12-08 DIAGNOSIS — D509 Iron deficiency anemia, unspecified: Secondary | ICD-10-CM | POA: Diagnosis not present

## 2021-12-08 DIAGNOSIS — F01B0 Vascular dementia, moderate, without behavioral disturbance, psychotic disturbance, mood disturbance, and anxiety: Secondary | ICD-10-CM

## 2021-12-08 DIAGNOSIS — N1831 Chronic kidney disease, stage 3a: Secondary | ICD-10-CM

## 2021-12-08 NOTE — Assessment & Plan Note (Signed)
11/02/19 CVA (MRI cevebral small acute infarcts in the posterior left hemisphere in the left PCA and MCA watershed area. Takes Eliquis.  

## 2021-12-08 NOTE — Assessment & Plan Note (Signed)
stable, off Sertraline.  

## 2021-12-08 NOTE — Progress Notes (Signed)
Location:   [place] Room Number: 40 A Place of Service:  SNF (31) Provider:  Chariti Havel X, NP  Virgie Dad, MD  Patient Care Team: Virgie Dad, MD as PCP - General (Internal Medicine)  Extended Emergency Contact Information Primary Emergency Contact: [place] Mobile Phone: [phone] Relation: [place] Secondary Emergency Contact: [place] Mobile Phone: [phone] Relation: Friend  Code Status:  DNR Goals of care: Advanced Directive information    12/08/2021    9:26 AM  Advanced Directives  Does Patient Have a Medical Advance Directive? Yes  Type of Paramedic of [place];Living will;Out of facility DNR (pink MOST or yellow form)  Does patient want to make changes to medical advance directive? No - Patient declined  Copy of [place] in Chart? Yes - validated most recent copy scanned in chart (See row information)  Pre-existing out of facility DNR order (yellow form or pink MOST form) Pink MOST form placed in chart (order not valid for inpatient use);Yellow form placed in chart (order not valid for inpatient use)     Chief Complaint  Patient presents with   Medical Management of Chronic Issues    Routine follow up visit.   Immunizations    Tetanus/tdap,pneumonia vaccine, COVID vaccine and Flu vaccine due    HPI:  Pt is a 88 y.o. female seen today for medical management of chronic diseases.   Edema, not apparent, off Torsemide. BNP 798 08/26/21, echo 08/27/21 EF 60-65%             PSVT off Amiodarone 01/2020 in hospital due to prolonged QT interval. On Metoprolol bid and prn,  Eliquis.              GERD, takes Omeprazole. Hgb 13.8 08/27/21             Depression, stable, off Sertraline.              CKD Bun/creat 14/1.04 08/27/21             Vascular dementia since CVA, no behavioral issues. MMSE 15/30 03/20/21             11/02/19 CVA (MRI cevebral small acute infarcts in the posterior  left hemisphere in the left PCA and MCA watershed area. Takes Eliquis.              Anemia, 01/21/20 Iron 44, ferritin 63, Vit B12 695, Hgb 13.8 521/23             Hyperlipidemia, takes Atorvastatin, LDL 18 06/08/21    Past Medical History:  Diagnosis Date   Atrial flutter, paroxysmal (HCC)    COVID-19    Dementia (HCC)    Diastolic dysfunction    Hyperlipidemia    Hypertension    PSVT (paroxysmal supraventricular tachycardia) ([place])    Stroke ([place])    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 ([place])    History reviewed. No pertinent surgical history.  Allergies  Allergen Reactions   Alphagan P [Brimonidine Tartrate] Other (See Comments)    Unknown reaction Listed on MAR   Azopt [Brinzolamide] Other (See Comments)    Unknown reaction Listed on MAR   Lumigan [Bimatoprost] Other (See Comments)    Unknown reaction Listed on MAR    Allergies as of 12/08/2021       Reactions   Alphagan P [brimonidine Tartrate] Other (See Comments)   Unknown reaction Listed on MAR   Azopt [brinzolamide]  Other (See Comments)   Unknown reaction Listed on MAR   Lumigan [bimatoprost] Other (See Comments)   Unknown reaction Listed on [facility]        Medication List        Accurate as of December 08, 2021 11:59 PM. If you have any questions, ask your nurse or doctor.          acetaminophen 325 MG tablet Commonly known as: TYLENOL Take 650 mg by mouth every 6 (six) hours as needed for headache (pain).   apixaban 2.5 MG Tabs tablet Commonly known as: ELIQUIS Take 1 tablet (2.5 mg total) by mouth 2 (two) times daily.   atorvastatin 10 MG tablet Commonly known as: LIPITOR Take 5 mg by mouth daily.   B COMPLEX VITAMINS (W/ FA) PO Take 1 tablet by mouth daily. Vitamin B complex - Folic Acid 0.4 mg   bismuth subsalicylate 275 TZ/00FV suspension Commonly known as: PEPTO BISMOL Take 60 mLs by mouth daily as needed for indigestion or diarrhea  or loose stools.   Calcium Carbonate-Vitamin D 600-400 MG-UNIT tablet Take 1 tablet by mouth daily.   lactose free nutrition Liqd Take 237 mLs by mouth daily.   magic mouthwash Soln Take 5 mLs by mouth in the morning, at noon, in the evening, and at bedtime. Swish and spit   melatonin 3 MG Tabs tablet Take 3 mg by mouth at bedtime.   metoprolol tartrate 25 MG tablet Commonly known as: LOPRESSOR Take 12.5 mg by mouth as needed.   metoprolol tartrate 25 MG tablet Commonly known as: LOPRESSOR Take 0.5 tablets (12.5 mg total) by mouth 2 (two) times daily.   MULTIPLE VITAMIN-FOLIC ACID PO Take 1 tablet by mouth daily.   omeprazole 20 MG capsule Commonly known as: PRILOSEC Take 20 mg by mouth daily.   PreviDent 5000 Booster Plus 1.1 % Pste Generic drug: Sodium Fluoride Place 1 application. onto teeth at bedtime.   timolol 0.5 % ophthalmic solution Commonly known as: TIMOPTIC Place 1 drop into both eyes daily.   triamcinolone ointment 0.1 % Commonly known as: KENALOG Apply 1 application. topically 2 (two) times daily.        Review of Systems  Constitutional:  Negative for activity change, appetite change and fever.  HENT:  Positive for hearing loss. Negative for congestion and trouble swallowing.   Eyes:  Negative for visual disturbance.  Respiratory:  Negative for shortness of breath.   Cardiovascular:  Positive for leg swelling.  Gastrointestinal:  Negative for abdominal pain and constipation.  Genitourinary:  Positive for frequency. Negative for dysuria and urgency.  Musculoskeletal:  Positive for arthralgias and gait problem.  Skin:  Negative for color change and wound.  Neurological:  Negative for speech difficulty, weakness and light-headedness.       Memory lapses.   Psychiatric/Behavioral:  Positive for confusion. Negative for sleep disturbance. The patient is not nervous/anxious.        Baseline mentation.     Immunization History  Administered Date(s)  Administered   DTaP 09/05/2004   Influenza, High Dose Seasonal PF 01/06/2014, 11/08/2018   Influenza, Quadrivalent, Recombinant, Inj, Pf 01/09/2018, 01/21/2019   Influenza-Unspecified 11/07/2013, 01/14/2020, 01/26/2021   Moderna Covid-19 Vaccine Bivalent Booster 69yr & up 04/04/2021   Moderna Sars-Covid-2 Vaccination 05/09/2019, 06/06/2019, 02/22/2020, 09/06/2020   Pneumococcal Conjugate-13 01/06/2014   Pneumococcal Polysaccharide-23 09/05/2004   Pneumococcal-Unspecified 09/05/2004, 10/18/2014   Zoster Recombinat (Shingrix) 03/21/2021, 05/24/2021   Pertinent  Health Maintenance Due  Topic Date Due  INFLUENZA VACCINE  11/07/2021   DEXA SCAN  Completed      08/15/2021    9:00 PM 08/26/2021    9:19 AM 08/26/2021    2:00 PM 08/26/2021    8:30 PM 08/27/2021   10:00 AM  Fall Risk  Patient Fall Risk Level High fall risk High fall risk High fall risk High fall risk High fall risk   Functional Status Survey:    Vitals:   12/08/21 0919  BP: (!) 144/80  Pulse: 60  Resp: 17  Temp: 97.8 F (36.6 C)  SpO2: 94%  Weight: 103 lb 9.6 oz (47 kg)  Height: '4\' 10"'$  (1.473 m)   Body mass index is 21.65 kg/m. Physical Exam Vitals and nursing note reviewed.  Constitutional:      Appearance: Normal appearance.  HENT:     Head: Normocephalic and atraumatic.     Nose: Nose normal.     Mouth/Throat:     Mouth: Mucous membranes are moist.  Eyes:     Extraocular Movements: Extraocular movements intact.     Conjunctiva/sclera: Conjunctivae normal.     Pupils: Pupils are equal, round, and reactive to light.  Cardiovascular:     Rate and Rhythm: Regular rhythm. Tachycardia present.     Heart sounds: No murmur heard. Pulmonary:     Effort: Pulmonary effort is normal.     Breath sounds: No rales.  Abdominal:     General: Bowel sounds are normal.     Palpations: Abdomen is soft.     Tenderness: There is no abdominal tenderness.  Musculoskeletal:     Cervical back: Normal range of motion and  neck supple.     Right lower leg: Edema present.     Left lower leg: No edema.     Comments: Trace edema RLE  Skin:    General: Skin is warm and dry.     Comments:    Neurological:     General: No focal deficit present.     Mental Status: She is alert and oriented to person, place, and time. Mental status is at baseline.     Motor: No weakness.     Coordination: Coordination normal.     Gait: Gait abnormal.     Comments: Ambulates with walker.   Psychiatric:        Mood and Affect: Mood normal.        Behavior: Behavior normal.        Thought Content: Thought content normal.     Labs reviewed: Recent Labs    08/13/21 1336 08/13/21 1420 08/16/21 0421 08/26/21 0919 08/26/21 1058 08/27/21 0404  NA  --    < > 141 141  --  142  K  --    < > 4.0 4.4  --  4.1  CL  --    < > 113* 111  --  109  CO2  --    < > 23 19*  --  26  GLUCOSE  --    < > 84 82  --  91  BUN  --    < > 21 16  --  14  CREATININE  --    < > 0.94 0.90  --  1.04*  CALCIUM  --    < > 8.1* 8.4*  --  8.4*  MG 2.3  --   --   --  1.4*  --    < > = values in this interval not displayed.   Recent  Labs    06/08/21 0000 08/13/21 1027 08/26/21 1058  AST 23 37 27  ALT '17 25 12  '$ ALKPHOS 77 80 56  BILITOT  --  1.0 1.3*  PROT  --  6.0* 3.9*  ALBUMIN 3.6 3.5 2.3*   Recent Labs    06/08/21 0000 06/08/21 0000 08/13/21 1027 08/13/21 1420 08/16/21 0421 08/26/21 0919 08/27/21 0404  WBC 6.2   < > 8.5  --  7.0 5.1 6.1  NEUTROABS 4,303.00  --  6.7  --  5.3  --   --   HGB 13.3  --  14.3   < > 12.2 13.7 13.8  HCT 41  --  46.1*   < > 38.0 43.9 42.6  MCV  --    < > 97.7  --  95.7 97.6 94.5  PLT 199  --  264  --  176 168 162   < > = values in this interval not displayed.   Lab Results  Component Value Date   TSH 3.779 08/26/2021   Lab Results  Component Value Date   HGBA1C 5.9 (H) 11/04/2019   Lab Results  Component Value Date   CHOL 110 09/19/2021   HDL 55 09/19/2021   LDLCALC 41 09/19/2021   TRIG 56  09/19/2021   CHOLHDL 2.8 11/06/2019    Significant Diagnostic Results in last 30 days:  No results found.  Assessment/Plan Paroxysmal SVT (supraventricular tachycardia) (HCC) PSVT off Amiodarone 01/2020 in hospital due to prolonged QT interval. On Metoprolol bid and prn,  Eliquis.   Diastolic dysfunction Edema, not apparent, off Torsemide. BNP 798 08/26/21, echo 08/27/21 EF 60-65%  GERD (gastroesophageal reflux disease) Stable,  takes Omeprazole. Hgb 13.8 08/27/21  Depression, recurrent (HCC)  stable, off Sertraline.   CKD (chronic kidney disease) stage 3, GFR 30-59 ml/min (HCC) Bun/creat 14/1.04 08/27/21  Vascular dementia [facility]) Vascular dementia since CVA, no behavioral issues. MMSE 15/30 03/20/21  Stroke ([place]) 11/02/19 CVA (MRI cevebral small acute infarcts in the posterior left hemisphere in the left PCA and MCA watershed area. Takes Eliquis.   Anemia, iron deficiency 01/21/20 Iron 44, ferritin 63, Vit B12 695, Hgb 13.8 521/23  Mixed hyperlipidemia  takes Atorvastatin, LDL 18 06/08/21      Family/ staff Communication: plan of care reviewed with the patient and charge nurse.   Labs/tests ordered:  none  Time spend 35 minutes

## 2021-12-08 NOTE — Assessment & Plan Note (Signed)
PSVT off Amiodarone 01/2020 in hospital due to prolonged QT interval. On Metoprolol bid and prn,  Eliquis.  

## 2021-12-08 NOTE — Assessment & Plan Note (Signed)
Edema, not apparent, off Torsemide. BNP 798 08/26/21, echo 08/27/21 EF 60-65%

## 2021-12-08 NOTE — Assessment & Plan Note (Signed)
Vascular dementia since CVA, no behavioral issues. MMSE 15/30 03/20/21 

## 2021-12-08 NOTE — Assessment & Plan Note (Signed)
Bun/creat 14/1.04 08/27/21 

## 2021-12-08 NOTE — Assessment & Plan Note (Signed)
takes Atorvastatin, LDL 18 06/08/21

## 2021-12-08 NOTE — Assessment & Plan Note (Signed)
01/21/20 Iron 44, ferritin 63, Vit B12 695, Hgb 13.8 08/27/21 

## 2021-12-08 NOTE — Assessment & Plan Note (Signed)
Stable, takes Omeprazole. Hgb 13.8 08/27/21 

## 2021-12-15 ENCOUNTER — Encounter: Payer: Self-pay | Admitting: Nurse Practitioner

## 2021-12-25 DIAGNOSIS — H401131 Primary open-angle glaucoma, bilateral, mild stage: Secondary | ICD-10-CM | POA: Diagnosis not present

## 2022-01-01 ENCOUNTER — Encounter: Payer: Self-pay | Admitting: Nurse Practitioner

## 2022-01-01 ENCOUNTER — Non-Acute Institutional Stay (SKILLED_NURSING_FACILITY): Payer: Medicare Other | Admitting: Nurse Practitioner

## 2022-01-01 DIAGNOSIS — I639 Cerebral infarction, unspecified: Secondary | ICD-10-CM

## 2022-01-01 DIAGNOSIS — F339 Major depressive disorder, recurrent, unspecified: Secondary | ICD-10-CM

## 2022-01-01 DIAGNOSIS — M2041 Other hammer toe(s) (acquired), right foot: Secondary | ICD-10-CM | POA: Diagnosis not present

## 2022-01-01 DIAGNOSIS — D509 Iron deficiency anemia, unspecified: Secondary | ICD-10-CM | POA: Diagnosis not present

## 2022-01-01 DIAGNOSIS — N1831 Chronic kidney disease, stage 3a: Secondary | ICD-10-CM

## 2022-01-01 DIAGNOSIS — F01B0 Vascular dementia, moderate, without behavioral disturbance, psychotic disturbance, mood disturbance, and anxiety: Secondary | ICD-10-CM

## 2022-01-01 DIAGNOSIS — R609 Edema, unspecified: Secondary | ICD-10-CM | POA: Diagnosis not present

## 2022-01-01 DIAGNOSIS — I471 Supraventricular tachycardia: Secondary | ICD-10-CM

## 2022-01-01 DIAGNOSIS — B351 Tinea unguium: Secondary | ICD-10-CM | POA: Insufficient documentation

## 2022-01-01 DIAGNOSIS — M2042 Other hammer toe(s) (acquired), left foot: Secondary | ICD-10-CM | POA: Diagnosis not present

## 2022-01-01 DIAGNOSIS — E782 Mixed hyperlipidemia: Secondary | ICD-10-CM

## 2022-01-01 DIAGNOSIS — K219 Gastro-esophageal reflux disease without esophagitis: Secondary | ICD-10-CM

## 2022-01-01 NOTE — Assessment & Plan Note (Signed)
takes Atorvastatin, LDL 41 09/19/21 

## 2022-01-01 NOTE — Assessment & Plan Note (Signed)
Vascular dementia since CVA, no behavioral issues. MMSE 15/30 03/20/21 

## 2022-01-01 NOTE — Progress Notes (Unsigned)
Location:   SNF [place] Room Number: 040/A Place of Service:  SNF (31) Provider: [facility] Alvin Rubano NP  Virgie Dad, MD  Patient Care Team: Virgie Dad, MD as PCP - General (Internal Medicine)  Extended Emergency Contact Information Primary Emergency Contact: [place] Mobile Phone: [phone] Relation: [place] Secondary Emergency Contact: [place] Mobile Phone: [phone] Relation: Friend  Code Status: DNR Goals of care: Advanced Directive information    01/01/2022    4:28 PM  Advanced Directives  Does Patient Have a Medical Advance Directive? Yes  Type of Paramedic of [place];Living will;Out of facility DNR (pink MOST or yellow form)  Does patient want to make changes to medical advance directive? No - Patient declined  Copy of [place] in Chart? Yes - validated most recent copy scanned in chart (See row information)  Pre-existing out of facility DNR order (yellow form or pink MOST form) Yellow form placed in chart (order not valid for inpatient use);Pink MOST form placed in chart (order not valid for inpatient use)     Chief Complaint  Patient presents with   Acute Visit    Right fourth toe pain.    HPI:  Pt is a 88 y.o. female seen today for an acute visit for reported the right 4th toe redness, painful when touched, no open area, ingrown toes nails or decreased ROM, but hammer toes and yellow thick toe nails most of her toes noted.  Edema, not apparent, off Torsemide. BNP 798 08/26/21, echo 08/27/21 EF 60-65%             PSVT off Amiodarone 01/2020 in hospital due to prolonged QT interval. On Metoprolol bid and prn,  Eliquis.              GERD, takes Omeprazole. Hgb 13.8 08/27/21             Depression, stable, off Sertraline.              CKD Bun/creat 14/1.04 08/27/21             Vascular dementia since CVA, no behavioral issues. MMSE 15/30 03/20/21             11/02/19 CVA (MRI cevebral small acute  infarcts in the posterior left hemisphere in the left PCA and MCA watershed area. Takes Eliquis.              Anemia, 01/21/20 Iron 44, ferritin 63, Vit B12 695, Hgb 13.8 08/27/21             Hyperlipidemia, takes Atorvastatin, LDL 41 09/19/21    Past Medical History:  Diagnosis Date   Atrial flutter, paroxysmal (HCC)    COVID-19    Dementia (HCC)    Diastolic dysfunction    Hyperlipidemia    Hypertension    PSVT (paroxysmal supraventricular tachycardia) ([place])    Stroke ([place])    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 ([place])    History reviewed. No pertinent surgical history.  Allergies  Allergen Reactions   Alphagan P [Brimonidine Tartrate] Other (See Comments)    Unknown reaction Listed on MAR   Azopt [Brinzolamide] Other (See Comments)    Unknown reaction Listed on MAR   Lumigan [Bimatoprost] Other (See Comments)    Unknown reaction Listed on MAR    Allergies as of 01/01/2022       Reactions   Alphagan P [brimonidine Tartrate] Other (See Comments)   Unknown reaction  Listed on MAR   Azopt [brinzolamide] Other (See Comments)   Unknown reaction Listed on MAR   Lumigan [bimatoprost] Other (See Comments)   Unknown reaction Listed on [facility]        Medication List        Accurate as of January 01, 2022 11:59 PM. If you have any questions, ask your nurse or doctor.          acetaminophen 325 MG tablet Commonly known as: TYLENOL Take 650 mg by mouth every 6 (six) hours as needed for headache (pain).   apixaban 2.5 MG Tabs tablet Commonly known as: ELIQUIS Take 1 tablet (2.5 mg total) by mouth 2 (two) times daily.   atorvastatin 10 MG tablet Commonly known as: LIPITOR Take 5 mg by mouth daily.   B COMPLEX VITAMINS (W/ FA) PO Take 1 tablet by mouth daily. Vitamin B complex - Folic Acid 0.4 mg   bismuth subsalicylate 952 WU/13KG suspension Commonly known as: PEPTO BISMOL Take 60 mLs by mouth daily as  needed for indigestion or diarrhea or loose stools.   Calcium Carbonate-Vitamin D 600-400 MG-UNIT tablet Take 1 tablet by mouth daily.   lactose free nutrition Liqd Take 237 mLs by mouth daily.   magic mouthwash Soln Take 5 mLs by mouth in the morning, at noon, in the evening, and at bedtime. Swish and spit   melatonin 3 MG Tabs tablet Take 3 mg by mouth at bedtime.   metoprolol tartrate 25 MG tablet Commonly known as: LOPRESSOR Take 12.5 mg by mouth as needed.   metoprolol tartrate 25 MG tablet Commonly known as: LOPRESSOR Take 0.5 tablets (12.5 mg total) by mouth 2 (two) times daily.   MULTIPLE VITAMIN-FOLIC ACID PO Take 1 tablet by mouth daily.   omeprazole 20 MG capsule Commonly known as: PRILOSEC Take 20 mg by mouth daily.   PreviDent 5000 Booster Plus 1.1 % Pste Generic drug: Sodium Fluoride Place 1 application. onto teeth at bedtime.   timolol 0.5 % ophthalmic solution Commonly known as: TIMOPTIC Place 1 drop into both eyes daily.   triamcinolone ointment 0.1 % Commonly known as: KENALOG Apply 1 application. topically 2 (two) times daily.        Review of Systems  Constitutional:  Negative for activity change, appetite change and fever.  HENT:  Positive for hearing loss. Negative for congestion and trouble swallowing.   Eyes:  Negative for visual disturbance.  Respiratory:  Negative for shortness of breath.   Cardiovascular:  Negative for leg swelling.  Gastrointestinal:  Negative for abdominal pain and constipation.  Genitourinary:  Positive for frequency. Negative for dysuria and urgency.  Musculoskeletal:  Positive for arthralgias and gait problem.  Skin:  Negative for color change and wound.  Neurological:  Negative for speech difficulty, weakness and light-headedness.       Memory lapses.   Psychiatric/Behavioral:  Positive for confusion. Negative for sleep disturbance. The patient is not nervous/anxious.        Baseline mentation.      Immunization History  Administered Date(s) Administered   DTaP 09/05/2004   Influenza, High Dose Seasonal PF 01/06/2014, 11/08/2018   Influenza, Quadrivalent, Recombinant, Inj, Pf 01/09/2018, 01/21/2019   Influenza-Unspecified 11/07/2013, 01/14/2020, 01/26/2021   Moderna Covid-19 Vaccine Bivalent Booster 57yr & up 04/04/2021   Moderna Sars-Covid-2 Vaccination 05/09/2019, 06/06/2019, 02/22/2020, 09/06/2020   Pneumococcal Conjugate-13 01/06/2014   Pneumococcal Polysaccharide-23 09/05/2004   Pneumococcal-Unspecified 09/05/2004, 10/18/2014   Zoster Recombinat (Shingrix) 03/21/2021, 05/24/2021   Pertinent  Health Maintenance  Due  Topic Date Due   INFLUENZA VACCINE  11/07/2021   DEXA SCAN  Completed      08/15/2021    9:00 PM 08/26/2021    9:19 AM 08/26/2021    2:00 PM 08/26/2021    8:30 PM 08/27/2021   10:00 AM  Fall Risk  Patient Fall Risk Level High fall risk High fall risk High fall risk High fall risk High fall risk   Functional Status Survey:    Vitals:   01/01/22 1406  BP: 109/64  Pulse: (!) 55  Resp: 18  SpO2: 97%   There is no height or weight on file to calculate BMI. Physical Exam Vitals and nursing note reviewed.  Constitutional:      Appearance: Normal appearance.  HENT:     Head: Normocephalic and atraumatic.     Nose: Nose normal.     Mouth/Throat:     Mouth: Mucous membranes are moist.  Eyes:     Extraocular Movements: Extraocular movements intact.     Conjunctiva/sclera: Conjunctivae normal.     Pupils: Pupils are equal, round, and reactive to light.  Cardiovascular:     Rate and Rhythm: Regular rhythm. Tachycardia present.     Heart sounds: No murmur heard. Pulmonary:     Effort: Pulmonary effort is normal.     Breath sounds: No rales.  Abdominal:     General: Bowel sounds are normal.     Palpations: Abdomen is soft.     Tenderness: There is no abdominal tenderness.  Musculoskeletal:     Cervical back: Normal range of motion and neck supple.      Right lower leg: No edema.     Left lower leg: No edema.  Skin:    General: Skin is warm and dry.     Findings: Erythema present.     Comments: Hammer toes most of her toes, the right 4th toe redness on the prominent DIP redness, no open areas, ingrown toe nails, or decreased ROM Yellow, thick toe nails.   Neurological:     General: No focal deficit present.     Mental Status: She is alert and oriented to person, place, and time. Mental status is at baseline.     Motor: No weakness.     Coordination: Coordination normal.     Gait: Gait abnormal.     Comments: Ambulates with walker.   Psychiatric:        Mood and Affect: Mood normal.        Behavior: Behavior normal.        Thought Content: Thought content normal.     Labs reviewed: Recent Labs    08/13/21 1336 08/13/21 1420 08/16/21 0421 08/26/21 0919 08/26/21 1058 08/27/21 0404  NA  --    < > 141 141  --  142  K  --    < > 4.0 4.4  --  4.1  CL  --    < > 113* 111  --  109  CO2  --    < > 23 19*  --  26  GLUCOSE  --    < > 84 82  --  91  BUN  --    < > 21 16  --  14  CREATININE  --    < > 0.94 0.90  --  1.04*  CALCIUM  --    < > 8.1* 8.4*  --  8.4*  MG 2.3  --   --   --  1.4*  --    < > = values in this interval not displayed.   Recent Labs    06/08/21 0000 08/13/21 1027 08/26/21 1058  AST 23 37 27  ALT '17 25 12  '$ ALKPHOS 77 80 56  BILITOT  --  1.0 1.3*  PROT  --  6.0* 3.9*  ALBUMIN 3.6 3.5 2.3*   Recent Labs    06/08/21 0000 06/08/21 0000 08/13/21 1027 08/13/21 1420 08/16/21 0421 08/26/21 0919 08/27/21 0404  WBC 6.2   < > 8.5  --  7.0 5.1 6.1  NEUTROABS 4,303.00  --  6.7  --  5.3  --   --   HGB 13.3  --  14.3   < > 12.2 13.7 13.8  HCT 41  --  46.1*   < > 38.0 43.9 42.6  MCV  --    < > 97.7  --  95.7 97.6 94.5  PLT 199  --  264  --  176 168 162   < > = values in this interval not displayed.   Lab Results  Component Value Date   TSH 3.779 08/26/2021   Lab Results  Component Value Date    HGBA1C 5.9 (H) 11/04/2019   Lab Results  Component Value Date   CHOL 110 09/19/2021   HDL 55 09/19/2021   LDLCALC 41 09/19/2021   TRIG 56 09/19/2021   CHOLHDL 2.8 11/06/2019    Significant Diagnostic Results in last 30 days:  No results found.  Assessment/Plan: Hammer toes, bilateral reported the right 4th toe redness, painful when touched, no open area, ingrown toes nails or decreased ROM, but hammer toes and yellow thick toe nails most of her toes noted.  Open toe shoes all the time.   Onychomycosis Podiatry ASAP  Mixed hyperlipidemia  takes Atorvastatin, LDL 41 09/19/21  Anemia, iron deficiency 01/21/20 Iron 44, ferritin 63, Vit B12 695, Hgb 13.8 521/23  Stroke ([place])  11/02/19 CVA (MRI cevebral small acute infarcts in the posterior left hemisphere in the left PCA and MCA watershed area. Takes Eliquis.   Vascular dementia [facility]) Vascular dementia since CVA, no behavioral issues. MMSE 15/30 03/20/21  CKD (chronic kidney disease) stage 3, GFR 30-59 ml/min (HCC)  Bun/creat 14/1.04 08/27/21  Depression, recurrent (HCC) stable, off Sertraline.   GERD (gastroesophageal reflux disease) takes Omeprazole. Hgb 13.8 08/27/21  Paroxysmal SVT (supraventricular tachycardia) (HCC) off Amiodarone 01/2020 in hospital due to prolonged QT interval. On Metoprolol bid and prn,  Eliquis. Noted bradycardia at times, will monitor apical pulses.   Edema  not apparent, off Torsemide. BNP 798 08/26/21, echo 08/27/21 EF 60-65%    Family/ staff Communication: plan of care reviewed with the patient and charge nurse.   Labs/tests ordered:  none  Time spend 35 minutes.

## 2022-01-01 NOTE — Assessment & Plan Note (Signed)
off Amiodarone 01/2020 in hospital due to prolonged QT interval. On Metoprolol bid and prn,  Eliquis. Noted bradycardia at times, will monitor apical pulses.

## 2022-01-01 NOTE — Assessment & Plan Note (Signed)
Bun/creat 14/1.04 08/27/21 

## 2022-01-01 NOTE — Assessment & Plan Note (Signed)
stable, off Sertraline.  

## 2022-01-01 NOTE — Assessment & Plan Note (Signed)
11/02/19 CVA (MRI cevebral small acute infarcts in the posterior left hemisphere in the left PCA and MCA watershed area. Takes Eliquis.  

## 2022-01-01 NOTE — Assessment & Plan Note (Signed)
Podiatry ASAP

## 2022-01-01 NOTE — Assessment & Plan Note (Signed)
reported the right 4th toe redness, painful when touched, no open area, ingrown toes nails or decreased ROM, but hammer toes and yellow thick toe nails most of her toes noted.  Open toe shoes all the time.

## 2022-01-01 NOTE — Assessment & Plan Note (Signed)
not apparent, off Torsemide. BNP 798 08/26/21, echo 08/27/21 EF 60-65% 

## 2022-01-01 NOTE — Assessment & Plan Note (Signed)
takes Omeprazole. Hgb 13.8 08/27/21 

## 2022-01-01 NOTE — Assessment & Plan Note (Signed)
01/21/20 Iron 44, ferritin 63, Vit B12 695, Hgb 13.8 08/27/21 

## 2022-01-02 ENCOUNTER — Encounter: Payer: Self-pay | Admitting: Nurse Practitioner

## 2022-01-02 DIAGNOSIS — F330 Major depressive disorder, recurrent, mild: Secondary | ICD-10-CM | POA: Diagnosis not present

## 2022-01-02 DIAGNOSIS — G4700 Insomnia, unspecified: Secondary | ICD-10-CM | POA: Diagnosis not present

## 2022-01-02 NOTE — Progress Notes (Unsigned)
[name]   Provider:  [name] [name] NP  Virgie Dad, MD  Patient Care Team: Virgie Dad, MD as PCP - General (Internal Medicine)  Extended Emergency Contact Information Primary Emergency Contact: [place] Mobile Phone: [phone] Relation: [place] Secondary Emergency Contact: [place] Mobile Phone: [phone] Relation: Friend  Code Status: DNR  Goals of care: Advanced Directive information    01/01/2022    4:28 PM  Advanced Directives  Does Patient Have a Medical Advance Directive? Yes  Type of Paramedic of [place];Living will;Out of facility DNR (pink MOST or yellow form)  Does patient want to make changes to medical advance directive? No - Patient declined  Copy of [place] in Chart? Yes - validated most recent copy scanned in chart (See row information)  Pre-existing out of facility DNR order (yellow form or pink MOST form) Yellow form placed in chart (order not valid for inpatient use);Pink MOST form placed in chart (order not valid for inpatient use)     No chief complaint on file.   HPI:  Pt is a 88 y.o. female seen today for an acute visit for    Past Medical History:  Diagnosis Date   Atrial flutter, paroxysmal ([place])    COVID-19    Dementia ([place])    Diastolic dysfunction    Hyperlipidemia    Hypertension    PSVT (paroxysmal supraventricular tachycardia) ([place])    Stroke ([place])    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 ([place])    No past surgical history on file.  Allergies  Allergen Reactions   Alphagan P [Brimonidine Tartrate] Other (See Comments)    Unknown reaction Listed on MAR   Azopt [Brinzolamide] Other (See Comments)    Unknown reaction Listed on MAR   Lumigan [Bimatoprost] Other (See Comments)    Unknown reaction Listed on MAR    Allergies as of 01/01/2022       Reactions   Alphagan P  [brimonidine Tartrate] Other (See Comments)   Unknown reaction Listed on MAR   Azopt [brinzolamide] Other (See Comments)   Unknown reaction Listed on MAR   Lumigan [bimatoprost] Other (See Comments)   Unknown reaction Listed on [facility]        Medication List        Accurate as of January 01, 2022 11:59 PM. If you have any questions, ask your nurse or doctor.          acetaminophen 325 MG tablet Commonly known as: TYLENOL Take 650 mg by mouth every 6 (six) hours as needed for headache (pain).   apixaban 2.5 MG Tabs tablet Commonly known as: ELIQUIS Take 1 tablet (2.5 mg total) by mouth 2 (two) times daily.   atorvastatin 10 MG tablet Commonly known as: LIPITOR Take 5 mg by mouth daily.   B COMPLEX VITAMINS (W/ FA) PO Take 1 tablet by mouth daily. Vitamin B complex - Folic Acid 0.4 mg   bismuth subsalicylate 638 VF/64PP suspension Commonly known as: PEPTO BISMOL Take 60 mLs by mouth daily as needed for indigestion or diarrhea or loose stools.   Calcium Carbonate-Vitamin D 600-400 MG-UNIT tablet Take 1 tablet by mouth daily.   lactose free nutrition Liqd Take 237 mLs by mouth daily.   magic mouthwash Soln Take 5 mLs by mouth in the morning, at noon, in the evening, and at bedtime. Swish and spit   melatonin 3 MG  Tabs tablet Take 3 mg by mouth at bedtime.   metoprolol tartrate 25 MG tablet Commonly known as: LOPRESSOR Take 12.5 mg by mouth as needed.   metoprolol tartrate 25 MG tablet Commonly known as: LOPRESSOR Take 0.5 tablets (12.5 mg total) by mouth 2 (two) times daily.   MULTIPLE VITAMIN-FOLIC ACID PO Take 1 tablet by mouth daily.   omeprazole 20 MG capsule Commonly known as: PRILOSEC Take 20 mg by mouth daily.   PreviDent 5000 Booster Plus 1.1 % Pste Generic drug: Sodium Fluoride Place 1 application. onto teeth at bedtime.   timolol 0.5 % ophthalmic solution Commonly known as: TIMOPTIC Place 1 drop into both eyes daily.   triamcinolone  ointment 0.1 % Commonly known as: KENALOG Apply 1 application. topically 2 (two) times daily.        Review of Systems  Immunization History  Administered Date(s) Administered   DTaP 09/05/2004   Influenza, High Dose Seasonal PF 01/06/2014, 11/08/2018   Influenza, Quadrivalent, Recombinant, Inj, Pf 01/09/2018, 01/21/2019   Influenza-Unspecified 11/07/2013, 01/14/2020, 01/26/2021   Moderna Covid-19 Vaccine Bivalent Booster 29yr & up 04/04/2021   Moderna Sars-Covid-2 Vaccination 05/09/2019, 06/06/2019, 02/22/2020, 09/06/2020   Pneumococcal Conjugate-13 01/06/2014   Pneumococcal Polysaccharide-23 09/05/2004   Pneumococcal-Unspecified 09/05/2004, 10/18/2014   Zoster Recombinat (Shingrix) 03/21/2021, 05/24/2021   Pertinent  Health Maintenance Due  Topic Date Due   INFLUENZA VACCINE  11/07/2021   DEXA SCAN  Completed      08/15/2021    9:00 PM 08/26/2021    9:19 AM 08/26/2021    2:00 PM 08/26/2021    8:30 PM 08/27/2021   10:00 AM  Fall Risk  Patient Fall Risk Level High fall risk High fall risk High fall risk High fall risk High fall risk   Functional Status Survey:    There were no vitals filed for this visit. There is no height or weight on file to calculate BMI. Physical Exam  Labs reviewed: Recent Labs    08/13/21 1336 08/13/21 1420 08/16/21 0421 08/26/21 0919 08/26/21 1058 08/27/21 0404  NA  --    < > 141 141  --  142  K  --    < > 4.0 4.4  --  4.1  CL  --    < > 113* 111  --  109  CO2  --    < > 23 19*  --  26  GLUCOSE  --    < > 84 82  --  91  BUN  --    < > 21 16  --  14  CREATININE  --    < > 0.94 0.90  --  1.04*  CALCIUM  --    < > 8.1* 8.4*  --  8.4*  MG 2.3  --   --   --  1.4*  --    < > = values in this interval not displayed.   Recent Labs    06/08/21 0000 08/13/21 1027 08/26/21 1058  AST 23 37 27  ALT '17 25 12  '$ ALKPHOS 77 80 56  BILITOT  --  1.0 1.3*  PROT  --  6.0* 3.9*  ALBUMIN 3.6 3.5 2.3*   Recent Labs    06/08/21 0000  06/08/21 0000 08/13/21 1027 08/13/21 1420 08/16/21 0421 08/26/21 0919 08/27/21 0404  WBC 6.2   < > 8.5  --  7.0 5.1 6.1  NEUTROABS 4,303.00  --  6.7  --  5.3  --   --   HGB  13.3  --  14.3   < > 12.2 13.7 13.8  HCT 41  --  46.1*   < > 38.0 43.9 42.6  MCV  --    < > 97.7  --  95.7 97.6 94.5  PLT 199  --  264  --  176 168 162   < > = values in this interval not displayed.   Lab Results  Component Value Date   TSH 3.779 08/26/2021   Lab Results  Component Value Date   HGBA1C 5.9 (H) 11/04/2019   Lab Results  Component Value Date   CHOL 110 09/19/2021   HDL 55 09/19/2021   LDLCALC 41 09/19/2021   TRIG 56 09/19/2021   CHOLHDL 2.8 11/06/2019    Significant Diagnostic Results in last 30 days:  No results found.  Assessment/Plan There are no diagnoses linked to this encounter.   Family/ staff Communication:   Labs/tests ordered:

## 2022-01-04 NOTE — Progress Notes (Signed)
This encounter was created in error - please disregard.

## 2022-01-09 ENCOUNTER — Non-Acute Institutional Stay (SKILLED_NURSING_FACILITY): Payer: Medicare Other | Admitting: Nurse Practitioner

## 2022-01-09 ENCOUNTER — Encounter: Payer: Self-pay | Admitting: Nurse Practitioner

## 2022-01-09 DIAGNOSIS — E782 Mixed hyperlipidemia: Secondary | ICD-10-CM | POA: Diagnosis not present

## 2022-01-09 DIAGNOSIS — N1831 Chronic kidney disease, stage 3a: Secondary | ICD-10-CM

## 2022-01-09 DIAGNOSIS — F01B0 Vascular dementia, moderate, without behavioral disturbance, psychotic disturbance, mood disturbance, and anxiety: Secondary | ICD-10-CM | POA: Diagnosis not present

## 2022-01-09 DIAGNOSIS — K219 Gastro-esophageal reflux disease without esophagitis: Secondary | ICD-10-CM

## 2022-01-09 DIAGNOSIS — I639 Cerebral infarction, unspecified: Secondary | ICD-10-CM | POA: Diagnosis not present

## 2022-01-09 DIAGNOSIS — F339 Major depressive disorder, recurrent, unspecified: Secondary | ICD-10-CM

## 2022-01-09 DIAGNOSIS — D509 Iron deficiency anemia, unspecified: Secondary | ICD-10-CM | POA: Diagnosis not present

## 2022-01-09 DIAGNOSIS — R609 Edema, unspecified: Secondary | ICD-10-CM | POA: Diagnosis not present

## 2022-01-09 DIAGNOSIS — I471 Supraventricular tachycardia, unspecified: Secondary | ICD-10-CM | POA: Diagnosis not present

## 2022-01-09 NOTE — Assessment & Plan Note (Signed)
11/02/19 CVA (MRI cevebral small acute infarcts in the posterior left hemisphere in the left PCA and MCA watershed area. Takes Eliquis.  

## 2022-01-09 NOTE — Assessment & Plan Note (Signed)
not apparent, off Torsemide. BNP 798 08/26/21, echo 08/27/21 EF 60-65% 

## 2022-01-09 NOTE — Progress Notes (Signed)
Location:  [facility] Room Number: NO/40/A Place of Service:  SNF (31) Provider:  Icie Kuznicki X, NP  Patient Care Team: Virgie Dad, MD as PCP - General (Internal Medicine)  Extended Emergency Contact Information Primary Emergency Contact: [place] Mobile Phone: [phone] Relation: [place] Secondary Emergency Contact: [place] Mobile Phone: [phone] Relation: Friend  Code Status:  DNR Goals of care: Advanced Directive information    01/09/2022   12:36 PM  Advanced Directives  Does Patient Have a Medical Advance Directive? Yes  Type of Paramedic of [place];Living will;Out of facility DNR (pink MOST or yellow form)  Does patient want to make changes to medical advance directive? No - Patient declined  Copy of [place] in Chart? Yes - validated most recent copy scanned in chart (See row information)  Pre-existing out of facility DNR order (yellow form or pink MOST form) Yellow form placed in chart (order not valid for inpatient use);Pink MOST form placed in chart (order not valid for inpatient use)     Chief Complaint  Patient presents with   Medical Management of Chronic Issues    Patient is here for a follow up for chronic conditions     HPI:  Pt is a 88 y.o. female seen today for medical management of chronic diseases.    Edema, not apparent, off Torsemide. BNP 798 08/26/21, echo 08/27/21 EF 60-65%             PSVT off Amiodarone 01/2020 in hospital due to prolonged QT interval. On Metoprolol bid and prn,  Eliquis.              GERD, takes Omeprazole. Hgb 13.8 08/27/21             Depression, stable, off Sertraline.              CKD Bun/creat 14/1.04 08/27/21             Vascular dementia since CVA, no behavioral issues. MMSE 15/30 03/20/21             11/02/19 CVA (MRI cevebral small acute infarcts in the posterior left hemisphere in the left PCA and MCA watershed area. Takes Eliquis.               Anemia, 01/21/20 Iron 44, ferritin 63, Vit B12 695, Hgb 13.8 08/27/21             Hyperlipidemia, takes Atorvastatin, LDL 41 09/19/21   Past Medical History:  Diagnosis Date   Atrial flutter, paroxysmal (HCC)    COVID-19    Dementia (HCC)    Diastolic dysfunction    Hyperlipidemia    Hypertension    PSVT (paroxysmal supraventricular tachycardia)    Stroke ([place])    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 ([place])    History reviewed. No pertinent surgical history.  Allergies  Allergen Reactions   Alphagan P [Brimonidine Tartrate] Other (See Comments)    Unknown reaction Listed on MAR   Azopt [Brinzolamide] Other (See Comments)    Unknown reaction Listed on MAR   Lumigan [Bimatoprost] Other (See Comments)    Unknown reaction Listed on [facility]    Outpatient Encounter Medications as of 01/09/2022  Medication Sig   acetaminophen (TYLENOL) 325 MG tablet Take 650 mg by mouth every 6 (six) hours as needed for headache (pain).   apixaban (ELIQUIS) 2.5 MG TABS tablet Take 1 tablet (2.5 mg total) by mouth 2 (  two) times daily.   atorvastatin (LIPITOR) 10 MG tablet Take 5 mg by mouth daily.   B Complex-Folic Acid (B COMPLEX VITAMINS, W/ FA, PO) Take 1 tablet by mouth daily. Vitamin B complex - Folic Acid 0.4 mg   bismuth subsalicylate (PEPTO BISMOL) 262 MG/15ML suspension Take 60 mLs by mouth daily as needed for indigestion or diarrhea or loose stools.    Calcium Carbonate-Vitamin D 600-400 MG-UNIT tablet Take 1 tablet by mouth daily.   lactose free nutrition (BOOST) LIQD Take 237 mLs by mouth daily.   magic mouthwash SOLN Take 5 mLs by mouth in the morning, at noon, in the evening, and at bedtime. Swish and spit   melatonin 3 MG TABS tablet Take 3 mg by mouth at bedtime.   metoprolol tartrate (LOPRESSOR) 25 MG tablet Take 12.5 mg by mouth as needed.   MULTIPLE VITAMIN-FOLIC ACID PO Take 1 tablet by mouth daily.   omeprazole (PRILOSEC)  20 MG capsule Take 20 mg by mouth daily.    Sodium Fluoride (PREVIDENT 5000 BOOSTER PLUS) 1.1 % PSTE Place 1 application. onto teeth at bedtime.   timolol (TIMOPTIC) 0.5 % ophthalmic solution Place 1 drop into both eyes daily.   triamcinolone ointment (KENALOG) 0.1 % Apply 1 application. topically 2 (two) times daily.   metoprolol tartrate (LOPRESSOR) 25 MG tablet Take 0.5 tablets (12.5 mg total) by mouth 2 (two) times daily.   No facility-administered encounter medications on file as of 01/09/2022.    Review of Systems  Constitutional:  Negative for activity change, fever and unexpected weight change.  HENT:  Positive for hearing loss. Negative for congestion and trouble swallowing.   Eyes:  Negative for visual disturbance.  Respiratory:  Negative for shortness of breath.   Cardiovascular:  Negative for leg swelling.  Gastrointestinal:  Negative for abdominal pain and constipation.  Genitourinary:  Positive for frequency. Negative for dysuria and urgency.  Musculoskeletal:  Positive for arthralgias and gait problem.  Skin:  Negative for color change and wound.  Neurological:  Negative for speech difficulty, weakness and light-headedness.       Memory lapses.   Psychiatric/Behavioral:  Positive for confusion. Negative for sleep disturbance. The patient is not nervous/anxious.        Baseline mentation.     Immunization History  Administered Date(s) Administered   DTaP 09/05/2004   Influenza, High Dose Seasonal PF 01/06/2014, 11/08/2018   Influenza, Quadrivalent, Recombinant, Inj, Pf 01/09/2018, 01/21/2019   Influenza-Unspecified 11/07/2013, 01/14/2020, 01/26/2021   Moderna Covid-19 Vaccine Bivalent Booster 48yr & up 04/04/2021   Moderna Sars-Covid-2 Vaccination 05/09/2019, 06/06/2019, 02/22/2020, 09/06/2020   Pneumococcal Conjugate-13 01/06/2014   Pneumococcal Polysaccharide-23 09/05/2004   Pneumococcal-Unspecified 09/05/2004, 10/18/2014   Zoster Recombinat (Shingrix) 03/21/2021,  05/24/2021   Pertinent  Health Maintenance Due  Topic Date Due   INFLUENZA VACCINE  11/07/2021   DEXA SCAN  Completed      08/26/2021    9:19 AM 08/26/2021    2:00 PM 08/26/2021    8:30 PM 08/27/2021   10:00 AM 01/09/2022   12:36 PM  Fall Risk  Falls in the past year?     0  Was there an injury with Fall?     0  Fall Risk Category Calculator     0  Fall Risk Category     Low  Patient Fall Risk Level High fall risk High fall risk High fall risk High fall risk Low fall risk  Patient at Risk for Falls Due to  No Fall Risks  Fall risk Follow up     Falls evaluation completed   Functional Status Survey:    Vitals:   01/09/22 1249  BP: 123/74  Pulse: 60  Resp: 16  Temp: (!) 97.3 F (36.3 C)  SpO2: 97%  Weight: 104 lb (47.2 kg)  Height: '4\' 10"'$  (1.473 m)   Body mass index is 21.74 kg/m. Physical Exam Vitals and nursing note reviewed.  Constitutional:      Appearance: Normal appearance.  HENT:     Head: Normocephalic and atraumatic.     Nose: Nose normal.     Mouth/Throat:     Mouth: Mucous membranes are moist.  Eyes:     Extraocular Movements: Extraocular movements intact.     Conjunctiva/sclera: Conjunctivae normal.     Pupils: Pupils are equal, round, and reactive to light.  Cardiovascular:     Rate and Rhythm: Normal rate and regular rhythm.     Heart sounds: No murmur heard. Pulmonary:     Effort: Pulmonary effort is normal.     Breath sounds: No rales.  Abdominal:     General: Bowel sounds are normal.     Palpations: Abdomen is soft.     Tenderness: There is no abdominal tenderness.  Musculoskeletal:     Cervical back: Normal range of motion and neck supple.     Right lower leg: No edema.     Left lower leg: No edema.  Skin:    General: Skin is warm and dry.     Comments: Hammer toes most of her toes, the right 4th toe redness on the prominent DIP redness better Yellow, thick toe nails.   Neurological:     General: No focal deficit present.      Mental Status: She is alert and oriented to person, place, and time. Mental status is at baseline.     Motor: No weakness.     Coordination: Coordination normal.     Gait: Gait abnormal.     Comments: Ambulates with walker.   Psychiatric:        Mood and Affect: Mood normal.        Behavior: Behavior normal.        Thought Content: Thought content normal.     Labs reviewed: Recent Labs    08/13/21 1336 08/13/21 1420 08/16/21 0421 08/26/21 0919 08/26/21 1058 08/27/21 0404  NA  --    < > 141 141  --  142  K  --    < > 4.0 4.4  --  4.1  CL  --    < > 113* 111  --  109  CO2  --    < > 23 19*  --  26  GLUCOSE  --    < > 84 82  --  91  BUN  --    < > 21 16  --  14  CREATININE  --    < > 0.94 0.90  --  1.04*  CALCIUM  --    < > 8.1* 8.4*  --  8.4*  MG 2.3  --   --   --  1.4*  --    < > = values in this interval not displayed.   Recent Labs    06/08/21 0000 08/13/21 1027 08/26/21 1058  AST 23 37 27  ALT '17 25 12  '$ ALKPHOS 77 80 56  BILITOT  --  1.0 1.3*  PROT  --  6.0* 3.9*  ALBUMIN  3.6 3.5 2.3*   Recent Labs    06/08/21 0000 06/08/21 0000 08/13/21 1027 08/13/21 1420 08/16/21 0421 08/26/21 0919 08/27/21 0404  WBC 6.2   < > 8.5  --  7.0 5.1 6.1  NEUTROABS 4,303.00  --  6.7  --  5.3  --   --   HGB 13.3  --  14.3   < > 12.2 13.7 13.8  HCT 41  --  46.1*   < > 38.0 43.9 42.6  MCV  --    < > 97.7  --  95.7 97.6 94.5  PLT 199  --  264  --  176 168 162   < > = values in this interval not displayed.   Lab Results  Component Value Date   TSH 3.779 08/26/2021   Lab Results  Component Value Date   HGBA1C 5.9 (H) 11/04/2019   Lab Results  Component Value Date   CHOL 110 09/19/2021   HDL 55 09/19/2021   LDLCALC 41 09/19/2021   TRIG 56 09/19/2021   CHOLHDL 2.8 11/06/2019    Significant Diagnostic Results in last 30 days:  No results found.  Assessment/Plan Paroxysmal SVT (supraventricular tachycardia) (HCC) off Amiodarone 01/2020 in hospital due to prolonged  QT interval. On Metoprolol bid and prn, intermittent PSVT and bradycardia, usually asymptomatic, on Eliquis.   GERD (gastroesophageal reflux disease) takes Omeprazole. Hgb 13.8 08/27/21  Depression, recurrent (HCC) stable, off Sertraline.   CKD (chronic kidney disease) stage 3, GFR 30-59 ml/min (HCC) Bun/creat 14/1.04 08/27/21  Vascular dementia [facility])  Vascular dementia since CVA, no behavioral issues. MMSE 15/30 03/20/21  Stroke ([place]) 11/02/19 CVA (MRI cevebral small acute infarcts in the posterior left hemisphere in the left PCA and MCA watershed area. Takes Eliquis.   Anemia, iron deficiency 01/21/20 Iron 44, ferritin 63, Vit B12 695, Hgb 13.8 08/27/21  Mixed hyperlipidemia takes Atorvastatin, LDL 41 6/13/  Edema  not apparent, off Torsemide. BNP 798 08/26/21, echo 08/27/21 EF 60-65%     Family/ staff Communication: plan of care reviewed with the patient and charge nurse.   Labs/tests ordered:  none  Time spend 35 minutes.

## 2022-01-09 NOTE — Assessment & Plan Note (Signed)
takes Omeprazole. Hgb 13.8 08/27/21 

## 2022-01-09 NOTE — Assessment & Plan Note (Signed)
takes Atorvastatin, LDL 41 6/13/

## 2022-01-09 NOTE — Assessment & Plan Note (Signed)
01/21/20 Iron 44, ferritin 63, Vit B12 695, Hgb 13.8 08/27/21 

## 2022-01-09 NOTE — Assessment & Plan Note (Signed)
Bun/creat 14/1.04 08/27/21 

## 2022-01-09 NOTE — Assessment & Plan Note (Signed)
stable, off Sertraline.  

## 2022-01-09 NOTE — Assessment & Plan Note (Addendum)
off Amiodarone 01/2020 in hospital due to prolonged QT interval. On Metoprolol bid and prn, intermittent PSVT and bradycardia, usually asymptomatic, on Eliquis.

## 2022-01-09 NOTE — Assessment & Plan Note (Signed)
Vascular dementia since CVA, no behavioral issues. MMSE 15/30 03/20/21 

## 2022-01-12 ENCOUNTER — Non-Acute Institutional Stay (SKILLED_NURSING_FACILITY): Payer: Medicare Other | Admitting: Nurse Practitioner

## 2022-01-12 ENCOUNTER — Encounter: Payer: Self-pay | Admitting: Nurse Practitioner

## 2022-01-12 DIAGNOSIS — F01B0 Vascular dementia, moderate, without behavioral disturbance, psychotic disturbance, mood disturbance, and anxiety: Secondary | ICD-10-CM

## 2022-01-12 DIAGNOSIS — N1831 Chronic kidney disease, stage 3a: Secondary | ICD-10-CM

## 2022-01-12 DIAGNOSIS — D509 Iron deficiency anemia, unspecified: Secondary | ICD-10-CM | POA: Diagnosis not present

## 2022-01-12 DIAGNOSIS — I471 Supraventricular tachycardia, unspecified: Secondary | ICD-10-CM | POA: Diagnosis not present

## 2022-01-12 DIAGNOSIS — K219 Gastro-esophageal reflux disease without esophagitis: Secondary | ICD-10-CM

## 2022-01-12 DIAGNOSIS — R609 Edema, unspecified: Secondary | ICD-10-CM | POA: Diagnosis not present

## 2022-01-12 DIAGNOSIS — I639 Cerebral infarction, unspecified: Secondary | ICD-10-CM

## 2022-01-12 DIAGNOSIS — R0789 Other chest pain: Secondary | ICD-10-CM

## 2022-01-12 DIAGNOSIS — E782 Mixed hyperlipidemia: Secondary | ICD-10-CM | POA: Diagnosis not present

## 2022-01-12 DIAGNOSIS — F339 Major depressive disorder, recurrent, unspecified: Secondary | ICD-10-CM | POA: Diagnosis not present

## 2022-01-12 NOTE — Progress Notes (Signed)
Location:   S[facility] Room Number: 40A Place of Service:  SNF (31) Provider: [facility] Jaquil Todt NP  Virgie Dad, MD  Patient Care Team: Virgie Dad, MD as PCP - General (Internal Medicine)  Extended Emergency Contact Information Primary Emergency Contact: [place] Mobile Phone: [phone] Relation: [place] Secondary Emergency Contact: [place] Mobile Phone: [phone] Relation: Friend  Code Status: DNR Goals of care: Advanced Directive information    01/09/2022   12:36 PM  Advanced Directives  Does Patient Have a Medical Advance Directive? Yes  Type of Paramedic of [place];Living will;Out of facility DNR (pink MOST or yellow form)  Does patient want to make changes to medical advance directive? No - Patient declined  Copy of [place] in Chart? Yes - validated most recent copy scanned in chart (See row information)  Pre-existing out of facility DNR order (yellow form or pink MOST form) Yellow form placed in chart (order not valid for inpatient use);Pink MOST form placed in chart (order not valid for inpatient use)     Chief Complaint  Patient presents with   Acute Visit    resolved waist line abd pain    HPI:  Pt is a 88 y.o. female seen today for an acute visit for resolved pain in the lower chest/waist line while in therapy. Denied dizziness, cough, nausea, vomiting, constipation or diarrhea. Denied sense of impending doom, chest pain, or diaphoresis. No O2 desaturation or altered level of consciousness.    Edema, not apparent, off Torsemide. BNP 798 08/26/21, echo 08/27/21 EF 60-65%             PSVT off Amiodarone 01/2020 in hospital due to prolonged QT interval. On Metoprolol bid and prn,  Eliquis.              GERD, takes Omeprazole. Hgb 13.8 08/27/21             Depression, stable, off Sertraline.              CKD Bun/creat 14/1.04 08/27/21             Vascular dementia since CVA, no behavioral  issues. MMSE 15/30 03/20/21             11/02/19 CVA (MRI cevebral small acute infarcts in the posterior left hemisphere in the left PCA and MCA watershed area. Takes Eliquis.              Anemia, 01/21/20 Iron 44, ferritin 63, Vit B12 695, Hgb 13.8 08/27/21             Hyperlipidemia, takes Atorvastatin, LDL 41 09/19/21      Past Medical History:  Diagnosis Date   Atrial flutter, paroxysmal (HCC)    COVID-19    Dementia (HCC)    Diastolic dysfunction    Hyperlipidemia    Hypertension    PSVT (paroxysmal supraventricular tachycardia)    Stroke ([place])    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 ([place])    History reviewed. No pertinent surgical history.  Allergies  Allergen Reactions   Alphagan P [Brimonidine Tartrate] Other (See Comments)    Unknown reaction Listed on MAR   Azopt [Brinzolamide] Other (See Comments)    Unknown reaction Listed on MAR   Lumigan [Bimatoprost] Other (See Comments)    Unknown reaction Listed on MAR    Allergies as of 01/12/2022       Reactions   Alphagan P [brimonidine Tartrate]  Other (See Comments)   Unknown reaction Listed on MAR   Azopt [brinzolamide] Other (See Comments)   Unknown reaction Listed on MAR   Lumigan [bimatoprost] Other (See Comments)   Unknown reaction Listed on [facility]        Medication List        Accurate as of January 12, 2022 11:59 PM. If you have any questions, ask your nurse or doctor.          acetaminophen 325 MG tablet Commonly known as: TYLENOL Take 650 mg by mouth every 6 (six) hours as needed for headache (pain).   apixaban 2.5 MG Tabs tablet Commonly known as: ELIQUIS Take 1 tablet (2.5 mg total) by mouth 2 (two) times daily.   atorvastatin 10 MG tablet Commonly known as: LIPITOR Take 5 mg by mouth daily.   B COMPLEX VITAMINS (W/ FA) PO Take 1 tablet by mouth daily. Vitamin B complex - Folic Acid 0.4 mg   bismuth subsalicylate 650 PT/46FK  suspension Commonly known as: PEPTO BISMOL Take 60 mLs by mouth daily as needed for indigestion or diarrhea or loose stools.   Calcium Carbonate-Vitamin D 600-400 MG-UNIT tablet Take 1 tablet by mouth daily.   lactose free nutrition Liqd Take 237 mLs by mouth daily.   magic mouthwash Soln Take 5 mLs by mouth in the morning, at noon, in the evening, and at bedtime. Swish and spit   melatonin 3 MG Tabs tablet Take 3 mg by mouth at bedtime.   metoprolol tartrate 25 MG tablet Commonly known as: LOPRESSOR Take 12.5 mg by mouth as needed.   metoprolol tartrate 25 MG tablet Commonly known as: LOPRESSOR Take 0.5 tablets (12.5 mg total) by mouth 2 (two) times daily.   MULTIPLE VITAMIN-FOLIC ACID PO Take 1 tablet by mouth daily.   omeprazole 20 MG capsule Commonly known as: PRILOSEC Take 20 mg by mouth daily.   PreviDent 5000 Booster Plus 1.1 % Pste Generic drug: Sodium Fluoride Place 1 application. onto teeth at bedtime.   timolol 0.5 % ophthalmic solution Commonly known as: TIMOPTIC Place 1 drop into both eyes daily.   triamcinolone ointment 0.1 % Commonly known as: KENALOG Apply 1 application. topically 2 (two) times daily.        Review of Systems  Unable to perform ROS: Dementia    Immunization History  Administered Date(s) Administered   DTaP 09/05/2004   Influenza, High Dose Seasonal PF 01/06/2014, 11/08/2018   Influenza, Quadrivalent, Recombinant, Inj, Pf 01/09/2018, 01/21/2019   Influenza-Unspecified 11/07/2013, 01/14/2020, 01/26/2021   Moderna Covid-19 Vaccine Bivalent Booster 76yr & up 04/04/2021   Moderna Sars-Covid-2 Vaccination 05/09/2019, 06/06/2019, 02/22/2020, 09/06/2020   Pneumococcal Conjugate-13 01/06/2014   Pneumococcal Polysaccharide-23 09/05/2004   Pneumococcal-Unspecified 09/05/2004, 10/18/2014   Zoster Recombinat (Shingrix) 03/21/2021, 05/24/2021   Pertinent  Health Maintenance Due  Topic Date Due   INFLUENZA VACCINE  11/07/2021    DEXA SCAN  Completed      08/26/2021    9:19 AM 08/26/2021    2:00 PM 08/26/2021    8:30 PM 08/27/2021   10:00 AM 01/09/2022   12:36 PM  Fall Risk  Falls in the past year?     0  Was there an injury with Fall?     0  Fall Risk Category Calculator     0  Fall Risk Category     Low  Patient Fall Risk Level High fall risk High fall risk High fall risk High fall risk Low fall risk  Patient  at Risk for Falls Due to     No Fall Risks  Fall risk Follow up     Falls evaluation completed   Functional Status Survey:    Vitals:   01/12/22 1642  BP: 114/62  Pulse: (!) 58  Resp: 16  Temp: (!) 97.5 F (36.4 C)  SpO2: 99%   There is no height or weight on file to calculate BMI. Physical Exam Vitals and nursing note reviewed.  Constitutional:      Appearance: Normal appearance.  HENT:     Head: Normocephalic and atraumatic.     Nose: Nose normal.     Mouth/Throat:     Mouth: Mucous membranes are moist.  Eyes:     Extraocular Movements: Extraocular movements intact.     Conjunctiva/sclera: Conjunctivae normal.     Pupils: Pupils are equal, round, and reactive to light.  Cardiovascular:     Rate and Rhythm: Normal rate and regular rhythm.     Heart sounds: No murmur heard. Pulmonary:     Effort: Pulmonary effort is normal.     Breath sounds: No rales.  Chest:     Chest wall: No tenderness.  Abdominal:     General: Bowel sounds are normal.     Palpations: Abdomen is soft.     Tenderness: There is no abdominal tenderness. There is no right CVA tenderness, left CVA tenderness, guarding or rebound.  Musculoskeletal:     Cervical back: Normal range of motion and neck supple.     Right lower leg: No edema.     Left lower leg: No edema.  Skin:    General: Skin is warm and dry.     Comments: Hammer toes most of her toes, the right 4th toe redness on the prominent DIP redness better Yellow, thick toe nails.   Neurological:     General: No focal deficit present.     Mental Status:  She is alert and oriented to person, place, and time. Mental status is at baseline.     Motor: No weakness.     Coordination: Coordination normal.     Gait: Gait abnormal.     Comments: Ambulates with walker.   Psychiatric:        Mood and Affect: Mood normal.        Behavior: Behavior normal.        Thought Content: Thought content normal.     Labs reviewed: Recent Labs    08/13/21 1336 08/13/21 1420 08/16/21 0421 08/26/21 0919 08/26/21 1058 08/27/21 0404  NA  --    < > 141 141  --  142  K  --    < > 4.0 4.4  --  4.1  CL  --    < > 113* 111  --  109  CO2  --    < > 23 19*  --  26  GLUCOSE  --    < > 84 82  --  91  BUN  --    < > 21 16  --  14  CREATININE  --    < > 0.94 0.90  --  1.04*  CALCIUM  --    < > 8.1* 8.4*  --  8.4*  MG 2.3  --   --   --  1.4*  --    < > = values in this interval not displayed.   Recent Labs    06/08/21 0000 08/13/21 1027 08/26/21 1058  AST 23 37 27  ALT _0 ALKPHOS 77 80 56  BILITOT  --  1.0 1.3*  PROT  --  6.0* 3.9*  ALBUMIN 3.6 3.5 2.3*   Recent Labs    06/08/21 0000 06/08/21 0000 08/13/21 1027 08/13/21 1420 08/16/21 0421 08/26/21 0919 08/27/21 0404  WBC 6.2   < > 8.5  --  7.0 5.1 6.1  NEUTROABS 4,303.00  --  6.7  --  5.3  --   --   HGB 13.3  --  14.3   < > 12.2 13.7 13.8  HCT 41  --  46.1*   < > 38.0 43.9 42.6  MCV  --    < > 97.7  --  95.7 97.6 94.5  PLT 199  --  264  --  176 168 162   < > = values in this interval not displayed.   Lab Results  Component Value Date   TSH 3.779 08/26/2021   Lab Results  Component Value Date   HGBA1C 5.9 (H) 11/04/2019   Lab Results  Component Value Date   CHOL 110 09/19/2021   HDL 55 09/19/2021   LDLCALC 41 09/19/2021   TRIG 56 09/19/2021   CHOLHDL 2.8 11/06/2019    Significant Diagnostic Results in last 30 days:  No results found.  Assessment/Plan: GERD (gastroesophageal reflux disease) in the lower chest/waist line while in therapy. Denied dizziness, cough,  nausea, vomiting, constipation or diarrhea. Denied sense of impending doom, chest pain, or diaphoresis. No O2 desaturation or altered level of consciousness. Takes Omeprazole for GERD, Hgb 13.8 08/27/21  Paroxysmal SVT (supraventricular tachycardia) (HCC) off Amiodarone 01/2020 in hospital due to prolonged QT interval. On Metoprolol bid and prn,  Eliquis.   Edema not apparent, off Torsemide. BNP 798 08/26/21, echo 08/27/21 EF 60-65%  Depression, recurrent (HCC) stable, off Sertraline.   CKD (chronic kidney disease) stage 3, GFR 30-59 ml/min (HCC) Bun/creat 14/1.04 08/27/21  Vascular dementia [facility])    Vascular dementia since CVA, no behavioral issues. MMSE 15/30 03/20/21, SNF [facility] for supportive care  Stroke [facility]) 11/02/19 CVA (MRI cevebral small acute infarcts in the posterior left hemisphere in the left PCA and MCA watershed area. Takes Eliquis.   Anemia, iron deficiency 01/21/20 Iron 44, ferritin 63, Vit B12 695, Hgb 13.8 08/27/21  Mixed hyperlipidemia takes Atorvastatin, LDL 41 09/19/21      Family/ staff Communication: plan of care reviewed with the patient and charge nurse.   Labs/tests ordered:  X-ray abd, CXR, CBC/diff, CMP/eGFR

## 2022-01-13 DIAGNOSIS — I10 Essential (primary) hypertension: Secondary | ICD-10-CM | POA: Diagnosis not present

## 2022-01-15 ENCOUNTER — Encounter: Payer: Self-pay | Admitting: Nurse Practitioner

## 2022-01-15 DIAGNOSIS — R109 Unspecified abdominal pain: Secondary | ICD-10-CM | POA: Diagnosis not present

## 2022-01-15 DIAGNOSIS — R079 Chest pain, unspecified: Secondary | ICD-10-CM | POA: Diagnosis not present

## 2022-01-15 NOTE — Assessment & Plan Note (Signed)
off Amiodarone 01/2020 in hospital due to prolonged QT interval. On Metoprolol bid and prn,  Eliquis.  

## 2022-01-15 NOTE — Assessment & Plan Note (Signed)
Bun/creat 14/1.04 08/27/21 

## 2022-01-15 NOTE — Assessment & Plan Note (Signed)
Vascular dementia since CVA, no behavioral issues. MMSE 15/30 03/20/21, SNF [facility] for supportive care

## 2022-01-15 NOTE — Assessment & Plan Note (Signed)
stable, off Sertraline.  

## 2022-01-15 NOTE — Assessment & Plan Note (Signed)
11/02/19 CVA (MRI cevebral small acute infarcts in the posterior left hemisphere in the left PCA and MCA watershed area. Takes Eliquis.  

## 2022-01-15 NOTE — Assessment & Plan Note (Signed)
takes Atorvastatin, LDL 41 09/19/21 

## 2022-01-15 NOTE — Assessment & Plan Note (Signed)
01/21/20 Iron 44, ferritin 63, Vit B12 695, Hgb 13.8 08/27/21 

## 2022-01-15 NOTE — Assessment & Plan Note (Signed)
in the lower chest/waist line while in therapy. Denied dizziness, cough, nausea, vomiting, constipation or diarrhea. Denied sense of impending doom, chest pain, or diaphoresis. No O2 desaturation or altered level of consciousness. Takes Omeprazole for GERD, Hgb 13.8 08/27/21

## 2022-01-15 NOTE — Assessment & Plan Note (Signed)
not apparent, off Torsemide. BNP 798 08/26/21, echo 08/27/21 EF 60-65% 

## 2022-02-01 ENCOUNTER — Ambulatory Visit: Payer: PRIVATE HEALTH INSURANCE | Admitting: Cardiology

## 2022-02-05 DIAGNOSIS — F330 Major depressive disorder, recurrent, mild: Secondary | ICD-10-CM | POA: Diagnosis not present

## 2022-02-05 DIAGNOSIS — G4700 Insomnia, unspecified: Secondary | ICD-10-CM | POA: Diagnosis not present

## 2022-02-08 DIAGNOSIS — Z23 Encounter for immunization: Secondary | ICD-10-CM | POA: Diagnosis not present

## 2022-02-12 DIAGNOSIS — F320 Major depressive disorder, single episode, mild: Secondary | ICD-10-CM | POA: Diagnosis not present

## 2022-03-06 ENCOUNTER — Non-Acute Institutional Stay (SKILLED_NURSING_FACILITY): Payer: Medicare Other | Admitting: Family Medicine

## 2022-03-06 DIAGNOSIS — G4700 Insomnia, unspecified: Secondary | ICD-10-CM | POA: Diagnosis not present

## 2022-03-06 DIAGNOSIS — Z7901 Long term (current) use of anticoagulants: Secondary | ICD-10-CM | POA: Diagnosis not present

## 2022-03-06 DIAGNOSIS — R609 Edema, unspecified: Secondary | ICD-10-CM | POA: Diagnosis not present

## 2022-03-06 DIAGNOSIS — F330 Major depressive disorder, recurrent, mild: Secondary | ICD-10-CM | POA: Diagnosis not present

## 2022-03-06 DIAGNOSIS — I4892 Unspecified atrial flutter: Secondary | ICD-10-CM | POA: Diagnosis not present

## 2022-03-06 DIAGNOSIS — R0789 Other chest pain: Secondary | ICD-10-CM

## 2022-03-06 DIAGNOSIS — F01B0 Vascular dementia, moderate, without behavioral disturbance, psychotic disturbance, mood disturbance, and anxiety: Secondary | ICD-10-CM

## 2022-03-06 DIAGNOSIS — N1831 Chronic kidney disease, stage 3a: Secondary | ICD-10-CM | POA: Diagnosis not present

## 2022-03-06 DIAGNOSIS — E782 Mixed hyperlipidemia: Secondary | ICD-10-CM | POA: Diagnosis not present

## 2022-03-06 NOTE — Progress Notes (Signed)
Provider:  Alain Honey, MD Location:      Place of Service:     PCP: Virgie Dad, MD Patient Care Team: Virgie Dad, MD as PCP - General (Internal Medicine)  Extended Emergency Contact Information Primary Emergency Contact: [place] Mobile Phone: [phone] Relation: [place] Secondary Emergency Contact: [place] Mobile Phone: [phone] Relation: Friend  Code Status:  Goals of Care: Advanced Directive information    01/09/2022   12:36 PM  Advanced Directives  Does Patient Have a Medical Advance Directive? Yes  Type of Paramedic of [place];Living will;Out of facility DNR (pink MOST or yellow form)  Does patient want to make changes to medical advance directive? No - Patient declined  Copy of [place] in Chart? Yes - validated most recent copy scanned in chart (See row information)  Pre-existing out of facility DNR order (yellow form or pink MOST form) Yellow form placed in chart (order not valid for inpatient use);Pink MOST form placed in chart (order not valid for inpatient use)      No chief complaint on file.   HPI: Patient is a 88 y.o. female seen today for medical management of chronic problems including dementia, atrial flutter, hyperlipidemia, hypertension, and past CVA.  I found her in the living room.  She told me she had been looking for a doctor due to some discomfort in her chest.  She is not cough or shortness of breath. She answers simple questions appropriately.  There are no other complaints except for the chest pain which exam today was unrevealing.  I explained this to her and she seemed good with that response. Continues on Eliquis and metoprolol for her atrial flutter.  She also continues on a atorvastatin for lipids but at her age I do not see why this needs to be continued.  Past Medical History:  Diagnosis Date   Atrial flutter, paroxysmal ([place])    COVID-19    Dementia (HCC)     Diastolic dysfunction    Hyperlipidemia    Hypertension    PSVT (paroxysmal supraventricular tachycardia)    Stroke ([place])    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 ([facility])    No past surgical history on file.  reports that she has never smoked. She has never used smokeless tobacco. She reports that she does not drink alcohol and does not use drugs. Social History   Socioeconomic History   Marital status: Widowed    Spouse name: Not on file   Number of children: 0   Years of education: Not on file   Highest education level: Not on file  Occupational History   Not on file  Tobacco Use   Smoking status: Never   Smokeless tobacco: Never  Vaping Use   Vaping Use: Never used  Substance and Sexual Activity   Alcohol use: Never   Drug use: Never   Sexual activity: Not on file  Other Topics Concern   Not on file  Social History Narrative   Not on file   Social Determinants of Health   Financial Resource Strain: Not on file  Food Insecurity: No Food Insecurity (08/16/2021)   Hunger Vital Sign    Worried About Running Out of Food in the Last Year: Never true    Ran Out of Food in the Last Year: Never true  Transportation Needs: Not on file  Physical Activity: Not on file  Stress: Not on file  Social Connections: Not on  file  Intimate Partner Violence: Not on file    Functional Status Survey:    No family history on file.  Health Maintenance  Topic Date Due   INFLUENZA VACCINE  11/07/2021   COVID-19 Vaccine (6 - 2023-24 season) 12/08/2021   Medicare Annual Wellness (AWV)  03/14/2022   Pneumonia Vaccine 66+ Years old  Completed   DEXA SCAN  Completed   Zoster Vaccines- Shingrix  Completed   HPV VACCINES  Aged Out    Allergies  Allergen Reactions   Alphagan P [Brimonidine Tartrate] Other (See Comments)    Unknown reaction Listed on MAR   Azopt [Brinzolamide] Other (See Comments)    Unknown reaction Listed on MAR    Lumigan [Bimatoprost] Other (See Comments)    Unknown reaction Listed on [facility]    Outpatient Encounter Medications as of 03/06/2022  Medication Sig   acetaminophen (TYLENOL) 325 MG tablet Take 650 mg by mouth every 6 (six) hours as needed for headache (pain).   apixaban (ELIQUIS) 2.5 MG TABS tablet Take 1 tablet (2.5 mg total) by mouth 2 (two) times daily.   atorvastatin (LIPITOR) 10 MG tablet Take 5 mg by mouth daily.   B Complex-Folic Acid (B COMPLEX VITAMINS, W/ FA, PO) Take 1 tablet by mouth daily. Vitamin B complex - Folic Acid 0.4 mg   bismuth subsalicylate (PEPTO BISMOL) 262 MG/15ML suspension Take 60 mLs by mouth daily as needed for indigestion or diarrhea or loose stools.    Calcium Carbonate-Vitamin D 600-400 MG-UNIT tablet Take 1 tablet by mouth daily.   lactose free nutrition (BOOST) LIQD Take 237 mLs by mouth daily.   magic mouthwash SOLN Take 5 mLs by mouth in the morning, at noon, in the evening, and at bedtime. Swish and spit   melatonin 3 MG TABS tablet Take 3 mg by mouth at bedtime.   metoprolol tartrate (LOPRESSOR) 25 MG tablet Take 0.5 tablets (12.5 mg total) by mouth 2 (two) times daily.   metoprolol tartrate (LOPRESSOR) 25 MG tablet Take 12.5 mg by mouth as needed.   MULTIPLE VITAMIN-FOLIC ACID PO Take 1 tablet by mouth daily.   omeprazole (PRILOSEC) 20 MG capsule Take 20 mg by mouth daily.    Sodium Fluoride (PREVIDENT 5000 BOOSTER PLUS) 1.1 % PSTE Place 1 application. onto teeth at bedtime.   timolol (TIMOPTIC) 0.5 % ophthalmic solution Place 1 drop into both eyes daily.   triamcinolone ointment (KENALOG) 0.1 % Apply 1 application. topically 2 (two) times daily.   No facility-administered encounter medications on file as of 03/06/2022.    Review of Systems  Unable to perform ROS: Dementia    There were no vitals filed for this visit. There is no height or weight on file to calculate BMI. Physical Exam Vitals and nursing note reviewed.  Constitutional:       Appearance: Normal appearance.  HENT:     Mouth/Throat:     Pharynx: Oropharynx is clear.  Eyes:     Pupils: Pupils are equal, round, and reactive to light.  Cardiovascular:     Rate and Rhythm: Normal rate and regular rhythm.  Abdominal:     General: Bowel sounds are normal.     Palpations: Abdomen is soft.  Musculoskeletal:     Comments: Uses walker for ambulation.  No recent falls.  Skin:    General: Skin is warm and dry.  Neurological:     General: No focal deficit present.     Mental Status: She is alert.  Comments: Oriented to self not time or place  Psychiatric:        Mood and Affect: Mood normal.     Comments: There have been no behaviors associated with her dementia     Labs reviewed: Basic Metabolic Panel: Recent Labs    08/13/21 1336 08/13/21 1420 08/16/21 0421 08/26/21 0919 08/26/21 1058 08/27/21 0404  NA  --    < > 141 141  --  142  K  --    < > 4.0 4.4  --  4.1  CL  --    < > 113* 111  --  109  CO2  --    < > 23 19*  --  26  GLUCOSE  --    < > 84 82  --  91  BUN  --    < > 21 16  --  14  CREATININE  --    < > 0.94 0.90  --  1.04*  CALCIUM  --    < > 8.1* 8.4*  --  8.4*  MG 2.3  --   --   --  1.4*  --    < > = values in this interval not displayed.   Liver Function Tests: Recent Labs    06/08/21 0000 08/13/21 1027 08/26/21 1058  AST 23 37 27  ALT '17 25 12  '$ ALKPHOS 77 80 56  BILITOT  --  1.0 1.3*  PROT  --  6.0* 3.9*  ALBUMIN 3.6 3.5 2.3*   Recent Labs    08/13/21 1027  LIPASE 31   No results for input(s): "AMMONIA" in the last 8760 hours. CBC: Recent Labs    06/08/21 0000 06/08/21 0000 08/13/21 1027 08/13/21 1420 08/16/21 0421 08/26/21 0919 08/27/21 0404  WBC 6.2   < > 8.5  --  7.0 5.1 6.1  NEUTROABS 4,303.00  --  6.7  --  5.3  --   --   HGB 13.3  --  14.3   < > 12.2 13.7 13.8  HCT 41  --  46.1*   < > 38.0 43.9 42.6  MCV  --    < > 97.7  --  95.7 97.6 94.5  PLT 199  --  264  --  176 168 162   < > = values in this  interval not displayed.   Cardiac Enzymes: No results for input(s): "CKTOTAL", "CKMB", "CKMBINDEX", "TROPONINI" in the last 8760 hours. BNP: Invalid input(s): "POCBNP" Lab Results  Component Value Date   HGBA1C 5.9 (H) 11/04/2019   Lab Results  Component Value Date   TSH 3.779 08/26/2021   Lab Results  Component Value Date   IONGEXBM84 132 01/21/2020   No results found for: "FOLATE" Lab Results  Component Value Date   IRON 44 01/21/2020   TIBC 273 01/21/2020   FERRITIN 63 01/21/2020    Imaging and Procedures obtained prior to SNF admission: ECHOCARDIOGRAM COMPLETE  Result Date: 08/27/2021    ECHOCARDIOGRAM REPORT   Patient Name:   Bianca [name] Date of Exam: 08/27/2021 Medical Rec #:  [id number]        Height:       58.0 in Accession #:    [id number]       Weight:       114.9 lb Date of Birth:  10-18-1922         BSA:          1.439 m Patient Age:    88 years  BP:           103/92 mmHg Patient Gender: F                HR:           124 bpm. Exam Location:  Inpatient Procedure: 2D Echo, Cardiac Doppler and Color Doppler Indications:    CHF  History:        Patient has prior history of Echocardiogram examinations, most                 recent 11/05/2019. Arrythmias:Tachycardia.  Sonographer:    Jefferey Pica Referring Phys: [id number] RAVI PAHWANI IMPRESSIONS  1. Left ventricular ejection fraction, by estimation, is 60 to 65%. The left ventricle has normal function. The left ventricle has no regional wall motion abnormalities. Indeterminate diastolic filling due to E-A fusion.  2. Right ventricular systolic function is moderately reduced. The right ventricular size is mildly enlarged. There is moderately elevated pulmonary artery systolic pressure.  3. Left atrial size was mildly dilated.  4. Right atrial size was mildly dilated.  5. The mitral valve is normal in structure. Trivial mitral valve regurgitation. No evidence of mitral stenosis.  6. Tricuspid valve regurgitation is severe.   7. The aortic valve is tricuspid. Aortic valve regurgitation is mild. No aortic stenosis is present.  8. The inferior vena cava is dilated in size with <50% respiratory variability, suggesting right atrial pressure of 15 mmHg. Comparison(s): No significant change from prior study. Prior images reviewed side by side. The right ventricular hypertrophy is worse. Systolic PA pressure is higher and tricuspid insufficiency is worse. FINDINGS  Left Ventricle: Left ventricular ejection fraction, by estimation, is 60 to 65%. The left ventricle has normal function. The left ventricle has no regional wall motion abnormalities. The left ventricular internal cavity size was normal in size. There is  no left ventricular hypertrophy. Indeterminate diastolic filling due to E-A fusion. Right Ventricle: The right ventricular size is mildly enlarged. No increase in right ventricular wall thickness. Right ventricular systolic function is moderately reduced. There is moderately elevated pulmonary artery systolic pressure. The tricuspid regurgitant velocity is 3.23 m/s, and with an assumed right atrial pressure of 15 mmHg, the estimated right ventricular systolic pressure is 01.0 mmHg. Left Atrium: Left atrial size was mildly dilated. Right Atrium: Right atrial size was mildly dilated. Pericardium: There is no evidence of pericardial effusion. Mitral Valve: The mitral valve is normal in structure. Trivial mitral valve regurgitation. No evidence of mitral valve stenosis. Tricuspid Valve: The tricuspid valve is normal in structure. Tricuspid valve regurgitation is severe. No evidence of tricuspid stenosis. Aortic Valve: The aortic valve is tricuspid. Aortic valve regurgitation is mild. No aortic stenosis is present. Aortic valve peak gradient measures 3.8 mmHg. Pulmonic Valve: The pulmonic valve was normal in structure. Pulmonic valve regurgitation is trivial. No evidence of pulmonic stenosis. Aorta: The aortic root is normal in size and  structure. Venous: The inferior vena cava is dilated in size with less than 50% respiratory variability, suggesting right atrial pressure of 15 mmHg. The inferior vena cava and the hepatic vein show a pattern of systolic flow reversal, suggestive of tricuspid regurgitation. IAS/Shunts: No atrial level shunt detected by color flow Doppler.  LEFT VENTRICLE PLAX 2D LVIDd:         2.70 cm LVIDs:         2.05 cm LV PW:         1.10 cm LV IVS:  1.10 cm LVOT diam:     1.80 cm LV SV:         32 LV SV Index:   22 LVOT Area:     2.54 cm  RIGHT VENTRICLE            IVC RV Basal diam:  4.10 cm    IVC diam: 2.20 cm RV Mid diam:    3.70 cm RV S prime:     6.39 cm/s LEFT ATRIUM             Index        RIGHT ATRIUM           Index LA diam:        3.90 cm 2.71 cm/m   RA Area:     18.20 cm LA Vol (A2C):   39.8 ml 27.66 ml/m  RA Volume:   53.00 ml  36.83 ml/m LA Vol (A4C):   34.9 ml 24.25 ml/m LA Biplane Vol: 38.6 ml 26.83 ml/m  AORTIC VALVE                 PULMONIC VALVE AV Area (Vmax): 2.00 cm     PV Vmax:       0.60 m/s AV Vmax:        97.70 cm/s   PV Peak grad:  1.4 mmHg AV Peak Grad:   3.8 mmHg LVOT Vmax:      76.90 cm/s LVOT Vmean:     45.200 cm/s LVOT VTI:       0.125 m  AORTA Ao Root diam: 2.90 cm Ao Asc diam:  2.80 cm TRICUSPID VALVE TR Peak grad:   41.7 mmHg TR Vmax:        323.00 cm/s  SHUNTS Systemic VTI:  0.12 m Systemic Diam: 1.80 cm Dani Gobble Croitoru MD Electronically signed by Sanda Klein MD Signature Date/Time: 08/27/2021/12:15:15 PM    Final    DG Chest 2 View  Result Date: 08/26/2021 CLINICAL DATA:  Chest pain.  Bradycardia. EXAM: CHEST - 2 VIEW COMPARISON:  08/13/2021 FINDINGS: Moderate cardiomegaly remains stable. New small right pleural effusion. Increased diffuse interstitial prominence suspicious for mild pulmonary edema. No evidence of pulmonary consolidation. IMPRESSION: Mild congestive heart failure with small right pleural effusion. Electronically Signed   By: Marlaine Hind M.D.   On:  08/26/2021 10:14    Assessment/Plan 1. Chest wall pain Heart and lung exam within normal limits.  Assume chest pain is related to musculoskeletal etiology  2. Stage 3a chronic kidney disease (HCC) BUN and creatinine are stable  3. Edema, unspecified type No edema noted today; not taking diuretic  4. Long term current use of anticoagulant Continues with low-dose apixaban for A-fib  5. Mixed hyperlipidemia Continues with atorvastatin 10 mg; was last assessed in 3-23  6. Paroxysmal atrial flutter (HCC) Symptoms regulated with metoprolol.  Appears to be in sinus rhythm today  7. Moderate vascular dementia without behavioral disturbance, psychotic disturbance, mood disturbance, or anxiety (HCC) No medications; behaviors normal    Family/ staff Communication:   Labs/tests ordered:  .smmsig

## 2022-03-12 ENCOUNTER — Telehealth: Payer: Self-pay | Admitting: Family Medicine

## 2022-03-12 ENCOUNTER — Encounter: Payer: Self-pay | Admitting: Family Medicine

## 2022-03-12 ENCOUNTER — Non-Acute Institutional Stay: Payer: Medicare Other | Admitting: Family Medicine

## 2022-03-12 VITALS — BP 118/50 | HR 61 | Temp 97.7°F | Resp 18 | Wt 104.1 lb

## 2022-03-12 DIAGNOSIS — E441 Mild protein-calorie malnutrition: Secondary | ICD-10-CM | POA: Diagnosis not present

## 2022-03-12 DIAGNOSIS — F01B0 Vascular dementia, moderate, without behavioral disturbance, psychotic disturbance, mood disturbance, and anxiety: Secondary | ICD-10-CM

## 2022-03-12 DIAGNOSIS — I4892 Unspecified atrial flutter: Secondary | ICD-10-CM | POA: Diagnosis not present

## 2022-03-12 NOTE — Progress Notes (Signed)
[facility] Palliative Care Consult Note Telephone: [phone]  Fax: [phone]    Date of encounter: 03/12/22 1:56 PM PATIENT NAME: Bianca [name] [place] [address]   [phone] (home)  DOB: Aug 04, 1922 MRN: [id number] PRIMARY CARE PROVIDER:    Virgie Dad, MD,  [place] [address] [phone]  REFERRING PROVIDER:   Virgie Dad, MD [address] [place],  [place] [address] [phone]  RESPONSIBLE PARTY:    Contact Information     Name Relation Home Work [place]   [phone]   Bianca [name]   [phone]   Bianca [name]   [phone]        I met face to face with patient in [facility] facility. Palliative Care was asked to follow this patient by consultation request of  Bianca Dad, MD to address advance care planning and complex medical decision making. This is a follow up visit   ASSESSMENT , SYMPTOM MANAGEMENT AND PLAN / RECOMMENDATIONS:   Vascular Dementia Not currently on any meds specifically for dementia Agree given age, renal function and dementia that long term benefit is not greater than short term risk of continued use of Atorvastatin.  2.  Mild protein calorie malnutrition Weight loss of 10 lbs in 7 months. Continue giving lactose free nutritional supplement, may increase to TID and offer frequent small snacks. Last Albumin 2.3 on 08/26/21, 2 weeks prior had been normal.  3.   Paroxysmal atrial tachycardia Agree with Metoprolol 12.5 mg daily.  Recommend not using automated machine to obtain pulse, manual apical pulse count for accuracy as flutter may not conduct all beats. Continue Eliquis 2.5 mg BID but if pt has falls may need to discuss risks vs benefit with nephew. Advance Care Planning/Goals of Care: Goals include to maximize quality of life and symptom management. Identification of a healthcare  agent-nephew, Bianca [name] Review of advance directive documents-DNR and MOST . Decision not to resuscitate or to de-escalate disease focused treatments due to poor prognosis. CODE STATUS: DNR and MOST  MOST as of 09/06/21: DNR/DNI with comfort measures Use of antibiotics and IV fluids on a case by case, time limited basis No feeding tube.      Follow up Palliative Care Visit: Palliative care will continue to follow for complex medical decision making, advance care planning, and clarification of goals. Return 4-6 weeks or prn.    This visit was coded based on medical decision making (MDM).  PPS: 40%  HOSPICE ELIGIBILITY/DIAGNOSIS: TBD  Chief Complaint:  Palliative Care is continuing to follow patient for chronic medical management in setting of vascular dementia.  HISTORY OF PRESENT ILLNESS:  Bianca [name] is a 88 y.o. year old female with vascular dementia, hx of PSVT/junctional tachycardia/bradycardia and paroxysmal atrial flutter on reduced dose anticoagulation, emphysema, chronic diarrhea, GERD, CKD, hx of stroke, IDA and protein calorie malnutrition. Pt is seen today, sleeping in bed, startles on arousal.  She states "some well dressed man came to see me."  Pt denies it was her nephew.  Denies pain or SOB, nausea or falls but is not a reliable historian. Nursing staff say she is eating well and drinking supplements.  She is continent of bowel and bladder.  History obtained from review of EMR, discussion with  facility staff and/or Bianca [name].   08/27/21 Echo: LV EF 60-65%, no regional wall motion abnormality.  Bi atrial mild  enlargement. RV systolic function is moderately reduced, mildly enlarged RV size with moderately elevated PASP.  Trivial MR.  Mild AR.  Severe tricuspid regurg with insufficiency.  RVSP 56.7 mm HG.  01/15/22 CX ([facility]): Normal CXR, no TB noted  01/15/22 KUB ([facility]): Normal KUB  I reviewed EMR for available labs, medications,  imaging, studies and related documents.  There are no new records since last visit/Records reviewed and summarized above.   ROS Unreliable historian with advanced dementia  Physical Exam: Current and past weights: 114.9 as of 08/27/21, 104 lbs 1.6 oz 03/09/22 Constitutional: NAD General: frail appearing, thin  ENMT: hard of hearing, oral mucous membranes moist, dentition intact CV: S1S2, IRIR, rate controlled, no LE edema Pulmonary: CTAB, no increased work of breathing, no cough, room air Abdomen:  normo-active BS + 4 quadrants, soft and non tender GU: deferred MSK: no sarcopenia, moves all extremities Skin: warm and dry, no rashes or wounds on visible skin Neuro:  no generalized weakness,  noted confusion Psych: non-anxious affect, A and O to self Hem/lymph/immuno: no widespread bruising   Thank you for the opportunity to participate in the care of Bianca [name].  The palliative care team will continue to follow. Please call our office at [phone] if we can be of additional assistance.   Bianca Conception, FNP -C  COVID-19 PATIENT SCREENING TOOL Asked and negative response unless otherwise noted:   Have you had symptoms of covid, tested positive or been in contact with someone with symptoms/positive test in the past 5-10 days?  unknown

## 2022-03-12 NOTE — Telephone Encounter (Signed)
TCT nephew [name] to update on visit today.  Advised pt is doing well, VSS, eating well and with supplement is maintaining her weight but overall a little more confused than last visit.  Asked if he had any concerns or questions.  He states "I think she is doing well overall to be 89."  Ensured he has my contact information for questions.  Damaris Hippo FNP-C

## 2022-03-28 ENCOUNTER — Encounter: Payer: Self-pay | Admitting: Nurse Practitioner

## 2022-03-28 ENCOUNTER — Non-Acute Institutional Stay (SKILLED_NURSING_FACILITY): Payer: Medicare Other | Admitting: Nurse Practitioner

## 2022-03-28 DIAGNOSIS — E782 Mixed hyperlipidemia: Secondary | ICD-10-CM | POA: Diagnosis not present

## 2022-03-28 DIAGNOSIS — K219 Gastro-esophageal reflux disease without esophagitis: Secondary | ICD-10-CM | POA: Diagnosis not present

## 2022-03-28 DIAGNOSIS — N1831 Chronic kidney disease, stage 3a: Secondary | ICD-10-CM

## 2022-03-28 DIAGNOSIS — F01B0 Vascular dementia, moderate, without behavioral disturbance, psychotic disturbance, mood disturbance, and anxiety: Secondary | ICD-10-CM

## 2022-03-28 DIAGNOSIS — F339 Major depressive disorder, recurrent, unspecified: Secondary | ICD-10-CM

## 2022-03-28 DIAGNOSIS — R609 Edema, unspecified: Secondary | ICD-10-CM

## 2022-03-28 DIAGNOSIS — D509 Iron deficiency anemia, unspecified: Secondary | ICD-10-CM

## 2022-03-28 DIAGNOSIS — I471 Supraventricular tachycardia, unspecified: Secondary | ICD-10-CM

## 2022-03-28 DIAGNOSIS — I639 Cerebral infarction, unspecified: Secondary | ICD-10-CM | POA: Diagnosis not present

## 2022-03-28 NOTE — Assessment & Plan Note (Signed)
stable, off Sertraline.

## 2022-03-28 NOTE — Assessment & Plan Note (Signed)
not apparent, off Torsemide. BNP 798 08/26/21, echo 08/27/21 EF 60-65%

## 2022-03-28 NOTE — Assessment & Plan Note (Signed)
Bun/creat 14/1.04 08/27/21

## 2022-03-28 NOTE — Assessment & Plan Note (Signed)
01/21/20 Iron 44, ferritin 63, Vit B12 695, Hgb 13.8 08/27/21

## 2022-03-28 NOTE — Assessment & Plan Note (Signed)
PSVT off Amiodarone 01/2020 in hospital due to prolonged QT interval. On Metoprolol bid and prn,  Eliquis.  

## 2022-03-28 NOTE — Assessment & Plan Note (Signed)
takes Atorvastatin, LDL 41 09/19/21

## 2022-03-28 NOTE — Assessment & Plan Note (Signed)
Vascular dementia since CVA, no behavioral issues. MMSE 15/30 03/20/21

## 2022-03-28 NOTE — Assessment & Plan Note (Signed)
11/02/19 CVA (MRI cevebral small acute infarcts in the posterior left hemisphere in the left PCA and MCA watershed area. Takes Eliquis.

## 2022-03-28 NOTE — Progress Notes (Signed)
Location:   SNF [place] Room Number: 40A Place of Service:  SNF (31) Provider: [facility] Persis Graffius NP  Virgie Dad, MD  Patient Care Team: Virgie Dad, MD as PCP - General (Internal Medicine)  Extended Emergency Contact Information Primary Emergency Contact: [place] Mobile Phone: [phone] Relation: [place] Secondary Emergency Contact: [place] Mobile Phone: [phone] Relation: Friend  Code Status:  DNR Goals of care: Advanced Directive information    01/09/2022   12:36 PM  Advanced Directives  Does Patient Have a Medical Advance Directive? Yes  Type of Paramedic of [place];Living will;Out of facility DNR (pink MOST or yellow form)  Does patient want to make changes to medical advance directive? No - Patient declined  Copy of [place] in Chart? Yes - validated most recent copy scanned in chart (See row information)  Pre-existing out of facility DNR order (yellow form or pink MOST form) Yellow form placed in chart (order not valid for inpatient use);Pink MOST form placed in chart (order not valid for inpatient use)     Chief Complaint  Patient presents with  . Medical Management of Chronic Issues    HPI:  Pt is a 88 y.o. female seen today for medical management of chronic diseases.    Edema, not apparent, off Torsemide. BNP 798 08/26/21, echo 08/27/21 EF 60-65%             PSVT off Amiodarone 01/2020 in hospital due to prolonged QT interval. On Metoprolol bid and prn,  Eliquis.              GERD, takes Omeprazole. Hgb 13.8 08/27/21             Depression, stable, off Sertraline.              CKD Bun/creat 14/1.04 08/27/21             Vascular dementia since CVA, no behavioral issues. MMSE 15/30 03/20/21             11/02/19 CVA (MRI cevebral small acute infarcts in the posterior left hemisphere in the left PCA and MCA watershed area. Takes Eliquis.              Anemia, 01/21/20 Iron 44, ferritin 63, Vit B12  695, Hgb 13.8 08/27/21             Hyperlipidemia, takes Atorvastatin, LDL 41 09/19/21     Past Medical History:  Diagnosis Date  . Atrial flutter, paroxysmal ([place])   . COVID-19   . Dementia ([place])   . Diastolic dysfunction   . Hyperlipidemia   . Hypertension   . PSVT (paroxysmal supraventricular tachycardia)   . Stroke ([place])   . 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 No pertinent surgical history.  Allergies  Allergen Reactions  . Alphagan P [Brimonidine Tartrate] Other (See Comments)    Unknown reaction Listed on MAR  . Azopt [Brinzolamide] Other (See Comments)    Unknown reaction Listed on MAR  . Lumigan [Bimatoprost] Other (See Comments)    Unknown reaction Listed on MAR    Allergies as of 03/28/2022       Reactions   Alphagan P [brimonidine Tartrate] Other (See Comments)   Unknown reaction Listed on MAR   Azopt [brinzolamide] Other (See Comments)   Unknown reaction Listed on MAR   Lumigan [bimatoprost] Other (See Comments)   Unknown reaction Listed on [facility]  Medication List        Accurate as of March 28, 2022  1:48 PM. If you have any questions, ask your nurse or doctor.          acetaminophen 325 MG tablet Commonly known as: TYLENOL Take 650 mg by mouth every 6 (six) hours as needed for headache (pain).   apixaban 2.5 MG Tabs tablet Commonly known as: ELIQUIS Take 1 tablet (2.5 mg total) by mouth 2 (two) times daily.   atorvastatin 10 MG tablet Commonly known as: LIPITOR Take 5 mg by mouth daily.   B COMPLEX VITAMINS (W/ FA) PO Take 1 tablet by mouth daily. Vitamin B complex - Folic Acid 0.4 mg   bismuth subsalicylate 836 OQ/94TM suspension Commonly known as: PEPTO BISMOL Take 60 mLs by mouth daily as needed for indigestion or diarrhea or loose stools.   Calcium Carbonate-Vitamin D 600-400 MG-UNIT tablet Take 1 tablet by mouth daily.   lactose free  nutrition Liqd Take 237 mLs by mouth daily.   magic mouthwash Soln Take 5 mLs by mouth in the morning, at noon, in the evening, and at bedtime. Swish and spit   melatonin 3 MG Tabs tablet Take 3 mg by mouth at bedtime.   metoprolol tartrate 25 MG tablet Commonly known as: LOPRESSOR Take 12.5 mg by mouth as needed.   MULTIPLE VITAMIN-FOLIC ACID PO Take 1 tablet by mouth daily.   omeprazole 20 MG capsule Commonly known as: PRILOSEC Take 20 mg by mouth daily.   PreviDent 5000 Booster Plus 1.1 % Pste Generic drug: Sodium Fluoride Place 1 application. onto teeth at bedtime.   timolol 0.5 % ophthalmic solution Commonly known as: TIMOPTIC Place 1 drop into both eyes daily.   triamcinolone ointment 0.1 % Commonly known as: KENALOG Apply 1 application. topically 2 (two) times daily.        Review of Systems  Constitutional:  Negative for activity change, fever and unexpected weight change.  HENT:  Positive for hearing loss. Negative for congestion and trouble swallowing.   Eyes:  Negative for visual disturbance.  Respiratory:  Negative for shortness of breath.   Cardiovascular:  Negative for leg swelling.  Gastrointestinal:  Negative for abdominal pain and constipation.  Genitourinary:  Positive for frequency. Negative for dysuria and urgency.  Musculoskeletal:  Positive for arthralgias and gait problem.  Skin:  Negative for color change and wound.  Neurological:  Negative for speech difficulty, weakness and light-headedness.       Memory lapses.   Psychiatric/Behavioral:  Positive for confusion. Negative for sleep disturbance. The patient is not nervous/anxious.        Baseline mentation.     Immunization History  Administered Date(s) Administered  . DTaP 09/05/2004  . Influenza, High Dose Seasonal PF 01/06/2014, 11/08/2018  . Influenza, Quadrivalent, Recombinant, Inj, Pf 01/09/2018, 01/21/2019  . Influenza-Unspecified 11/07/2013, 01/14/2020, 01/26/2021  . Moderna  Covid-19 Vaccine Bivalent Booster 63yr & up 04/04/2021  . Moderna Sars-Covid-2 Vaccination 05/09/2019, 06/06/2019, 02/22/2020, 09/06/2020  . Pneumococcal Conjugate-13 01/06/2014  . Pneumococcal Polysaccharide-23 09/05/2004  . Pneumococcal-Unspecified 09/05/2004, 10/18/2014  . Zoster Recombinat (Shingrix) 03/21/2021, 05/24/2021   Pertinent  Health Maintenance Due  Topic Date Due  . INFLUENZA VACCINE  11/07/2021  . DEXA SCAN  Completed      08/26/2021    9:19 AM 08/26/2021    2:00 PM 08/26/2021    8:30 PM 08/27/2021   10:00 AM 01/09/2022   12:36 PM  Fall Risk  Falls in the past year?  0  Was there an injury with Fall?     0  Fall Risk Category Calculator     0  Fall Risk Category     Low  Patient Fall Risk Level High fall risk High fall risk High fall risk High fall risk Low fall risk  Patient at Risk for Falls Due to     No Fall Risks  Fall risk Follow up     Falls evaluation completed   Functional Status Survey:    Vitals:   03/28/22 1334  BP: 104/62  Pulse: 63  Resp: 14  Temp: (!) 97 F (36.1 C)  SpO2: 96%   There is no height or weight on file to calculate BMI. Physical Exam Vitals and nursing note reviewed.  Constitutional:      Appearance: Normal appearance.  HENT:     Head: Normocephalic and atraumatic.     Nose: Nose normal.     Mouth/Throat:     Mouth: Mucous membranes are moist.  Eyes:     Extraocular Movements: Extraocular movements intact.     Conjunctiva/sclera: Conjunctivae normal.     Pupils: Pupils are equal, round, and reactive to light.  Cardiovascular:     Rate and Rhythm: Normal rate and regular rhythm.     Heart sounds: No murmur heard. Pulmonary:     Effort: Pulmonary effort is normal.     Breath sounds: No rales.  Chest:     Chest wall: No tenderness.  Abdominal:     General: Bowel sounds are normal.     Palpations: Abdomen is soft.     Tenderness: There is no abdominal tenderness. There is no right CVA tenderness, left CVA  tenderness, guarding or rebound.  Musculoskeletal:     Cervical back: Normal range of motion and neck supple.     Right lower leg: No edema.     Left lower leg: No edema.  Skin:    General: Skin is warm and dry.     Comments: Hammer toes most of her toes, the right 4th toe redness on the prominent DIP redness better Yellow, thick toe nails.   Neurological:     General: No focal deficit present.     Mental Status: She is alert and oriented to person, place, and time. Mental status is at baseline.     Motor: No weakness.     Coordination: Coordination normal.     Gait: Gait abnormal.     Comments: Ambulates with walker.   Psychiatric:        Mood and Affect: Mood normal.        Behavior: Behavior normal.        Thought Content: Thought content normal.    Labs reviewed: Recent Labs    08/13/21 1336 08/13/21 1420 08/16/21 0421 08/26/21 0919 08/26/21 1058 08/27/21 0404  NA  --    < > 141 141  --  142  K  --    < > 4.0 4.4  --  4.1  CL  --    < > 113* 111  --  109  CO2  --    < > 23 19*  --  26  GLUCOSE  --    < > 84 82  --  91  BUN  --    < > 21 16  --  14  CREATININE  --    < > 0.94 0.90  --  1.04*  CALCIUM  --    < >  8.1* 8.4*  --  8.4*  MG 2.3  --   --   --  1.4*  --    < > = values in this interval not displayed.   Recent Labs    06/08/21 0000 08/13/21 1027 08/26/21 1058  AST 23 37 27  ALT '17 25 12  '$ ALKPHOS 77 80 56  BILITOT  --  1.0 1.3*  PROT  --  6.0* 3.9*  ALBUMIN 3.6 3.5 2.3*   Recent Labs    06/08/21 0000 06/08/21 0000 08/13/21 1027 08/13/21 1420 08/16/21 0421 08/26/21 0919 08/27/21 0404  WBC 6.2   < > 8.5  --  7.0 5.1 6.1  NEUTROABS 4,303.00  --  6.7  --  5.3  --   --   HGB 13.3  --  14.3   < > 12.2 13.7 13.8  HCT 41  --  46.1*   < > 38.0 43.9 42.6  MCV  --    < > 97.7  --  95.7 97.6 94.5  PLT 199  --  264  --  176 168 162   < > = values in this interval not displayed.   Lab Results  Component Value Date   TSH 3.779 08/26/2021   Lab  Results  Component Value Date   HGBA1C 5.9 (H) 11/04/2019   Lab Results  Component Value Date   CHOL 110 09/19/2021   HDL 55 09/19/2021   LDLCALC 41 09/19/2021   TRIG 56 09/19/2021   CHOLHDL 2.8 11/06/2019    Significant Diagnostic Results in last 30 days:  No results found.  Assessment/Plan  Edema not apparent, off Torsemide. BNP 798 08/26/21, echo 08/27/21 EF 60-65%  Paroxysmal SVT (supraventricular tachycardia) (HCC)  PSVT off Amiodarone 01/2020 in hospital due to prolonged QT interval. On Metoprolol bid and prn,  Eliquis.   GERD (gastroesophageal reflux disease) Stable, takes Omeprazole. Hgb 13.8 08/27/21  Depression, recurrent (HCC) stable, off Sertraline.   CKD (chronic kidney disease) stage 3, GFR 30-59 ml/min (HCC) Bun/creat 14/1.04 08/27/21  Vascular dementia [facility])  Vascular dementia since CVA, no behavioral issues. MMSE 15/30 03/20/21  Stroke ([place]) 11/02/19 CVA (MRI cevebral small acute infarcts in the posterior left hemisphere in the left PCA and MCA watershed area. Takes Eliquis.   Anemia, iron deficiency 01/21/20 Iron 44, ferritin 63, Vit B12 695, Hgb 13.8 08/27/21  Mixed hyperlipidemia  takes Atorvastatin, LDL 41 09/19/21   Family/ staff Communication: plan of care reviewed with the patient and charge nurse.   Labs/tests ordered:  none  Time spend 35 minutes.

## 2022-03-28 NOTE — Assessment & Plan Note (Signed)
Stable, takes Omeprazole. Hgb 13.8 08/27/21

## 2022-03-30 DIAGNOSIS — F330 Major depressive disorder, recurrent, mild: Secondary | ICD-10-CM | POA: Diagnosis not present

## 2022-03-30 DIAGNOSIS — G4700 Insomnia, unspecified: Secondary | ICD-10-CM | POA: Diagnosis not present

## 2022-04-27 ENCOUNTER — Encounter: Payer: Self-pay | Admitting: Nurse Practitioner

## 2022-04-27 ENCOUNTER — Non-Acute Institutional Stay (SKILLED_NURSING_FACILITY): Payer: Medicare Other | Admitting: Nurse Practitioner

## 2022-04-27 DIAGNOSIS — I639 Cerebral infarction, unspecified: Secondary | ICD-10-CM | POA: Diagnosis not present

## 2022-04-27 DIAGNOSIS — D509 Iron deficiency anemia, unspecified: Secondary | ICD-10-CM

## 2022-04-27 DIAGNOSIS — M159 Polyosteoarthritis, unspecified: Secondary | ICD-10-CM

## 2022-04-27 DIAGNOSIS — K219 Gastro-esophageal reflux disease without esophagitis: Secondary | ICD-10-CM

## 2022-04-27 DIAGNOSIS — R609 Edema, unspecified: Secondary | ICD-10-CM

## 2022-04-27 DIAGNOSIS — N1831 Chronic kidney disease, stage 3a: Secondary | ICD-10-CM | POA: Diagnosis not present

## 2022-04-27 DIAGNOSIS — E782 Mixed hyperlipidemia: Secondary | ICD-10-CM | POA: Diagnosis not present

## 2022-04-27 DIAGNOSIS — F339 Major depressive disorder, recurrent, unspecified: Secondary | ICD-10-CM

## 2022-04-27 DIAGNOSIS — F0390 Unspecified dementia without behavioral disturbance: Secondary | ICD-10-CM

## 2022-04-27 DIAGNOSIS — I471 Supraventricular tachycardia, unspecified: Secondary | ICD-10-CM | POA: Diagnosis not present

## 2022-04-27 NOTE — Assessment & Plan Note (Signed)
01/21/20 Iron 44, ferritin 63, Vit B12 695, Hgb 13.8 08/27/21

## 2022-04-27 NOTE — Assessment & Plan Note (Signed)
Bun/creat 14/1.04 08/27/21

## 2022-04-27 NOTE — Assessment & Plan Note (Signed)
lower back, aches, ambulates with walker, will Tylenol '500mg'$  bid x 1 weeks.

## 2022-04-27 NOTE — Assessment & Plan Note (Signed)
takes Omeprazole. Hgb 13.8 08/27/21 

## 2022-04-27 NOTE — Assessment & Plan Note (Signed)
stable, off Sertraline.

## 2022-04-27 NOTE — Assessment & Plan Note (Signed)
Senile dementia since CVA, no behavioral issues. MMSE 15/30 03/20/21

## 2022-04-27 NOTE — Progress Notes (Signed)
Location:  [place] Room Number: 40-A Place of Service:  SNF [phone]) Provider:  [place] Lions, MD  Patient Care Team: Virgie Dad, MD as PCP - General (Internal Medicine)  Extended Emergency Contact Information Primary Emergency Contact: [place] Mobile Phone: [phone] Relation: [place] Secondary Emergency Contact: [place] Mobile Phone: [phone] Relation: Friend  Code Status:  DNR Goals of care: Advanced Directive information    04/27/2022   11:30 AM  Advanced Directives  Does Patient Have a Medical Advance Directive? Yes  Type of Paramedic of [place];Living will;Out of facility DNR (pink MOST or yellow form)  Does patient want to make changes to medical advance directive? No - Patient declined  Copy of [place] in Chart? Yes - validated most recent copy scanned in chart (See row information)  Pre-existing out of facility DNR order (yellow form or pink MOST form) Pink MOST/Yellow Form most recent copy in chart - Physician notified to receive inpatient order     Chief Complaint  Patient presents with   Routine    HPI:  Pt is a 89 y.o. female seen today for medical management of chronic diseases.       Edema, not apparent, off Torsemide. BNP 798 08/26/21, echo 08/27/21 EF 60-65%             PSVT off Amiodarone 01/2020 in hospital due to prolonged QT interval. On Metoprolol bid and prn,  Eliquis.              GERD, takes Omeprazole. Hgb 13.8 08/27/21             Depression, stable, off Sertraline.              CKD Bun/creat 14/1.04 08/27/21             Senile dementia since CVA, no behavioral issues. MMSE 15/30 03/20/21             11/02/19 CVA (MRI cevebral small acute infarcts in the posterior left hemisphere in the left PCA and MCA watershed area. Takes Eliquis.              Anemia, 01/21/20 Iron 44, ferritin 63, Vit B12 695, Hgb 13.8 08/27/21              Hyperlipidemia, takes Atorvastatin, LDL 41 09/19/21  OA lower back, aches, takes Tylenol.          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 No pertinent surgical history.  Allergies  Allergen Reactions   Alphagan P [Brimonidine Tartrate] Other (See Comments)    Unknown reaction Listed on MAR   Azopt [Brinzolamide] Other (See Comments)    Unknown reaction Listed  on MAR   Lumigan [Bimatoprost] Other (See Comments)    Unknown reaction Listed on [facility]    Outpatient Encounter Medications as of 04/27/2022  Medication Sig   acetaminophen (TYLENOL) 325 MG tablet Take 650 mg by mouth every 6 (six) hours as needed for headache (pain).   AMBULATORY NON FORMULARY MEDICATION DUKES MAGIC MOUTHWASH Give 5 mg/ml orally every 6 hours as needed for mouth care SWISH AND SPIT 4X DAILY BY MOUTH   apixaban (ELIQUIS) 2.5 MG TABS tablet Take 1 tablet (2.5 mg total) by mouth 2 (two) times daily.   atorvastatin (LIPITOR) 10 MG tablet Take 5 mg by mouth daily.   B Complex-Folic Acid (B COMPLEX VITAMINS, W/ FA, PO) Take 1 tablet by mouth daily. Vitamin B complex - Folic Acid 0.4 mg   bismuth subsalicylate (PEPTO BISMOL) 262 MG/15ML suspension Take 60 mLs by mouth daily as needed for indigestion or diarrhea or loose stools.    Calcium Carbonate-Vitamin D 600-400  MG-UNIT tablet Take 1 tablet by mouth daily.   melatonin 3 MG TABS tablet Take 3 mg by mouth at bedtime.   metoprolol tartrate (LOPRESSOR) 25 MG tablet Take 12.5 mg by mouth as needed. Give 0.5 tablet orally two times a day for SVT HOLD IF HR < 50   MULTIPLE VITAMIN-FOLIC ACID PO Take 1 tablet by mouth daily.   omeprazole (PRILOSEC) 20 MG capsule Take 20 mg by mouth daily.    Sodium Fluoride (PREVIDENT 5000 BOOSTER PLUS) 1.1 % PSTE Place 1 application. onto teeth at bedtime.   timolol (TIMOPTIC) 0.5 % ophthalmic solution Place 1 drop into both eyes daily.   lactose free nutrition (BOOST) LIQD Take 237 mLs by mouth daily. (Patient not taking: Reported on 04/27/2022)   triamcinolone ointment (KENALOG) 0.1 % Apply 1 application. topically 2 (two) times daily. (Patient not taking: Reported on 04/27/2022)   [DISCONTINUED] magic mouthwash SOLN Take 5 mLs by mouth in the morning, at noon, in the evening, and at bedtime. Swish and spit   No facility-administered encounter medications on file as of 04/27/2022.    Review of Systems  Constitutional:  Negative for activity change, fever and unexpected weight change.  HENT:  Positive for hearing loss. Negative for congestion and trouble swallowing.   Eyes:  Negative for visual disturbance.  Respiratory:  Negative for shortness of breath.   Cardiovascular:  Negative for leg swelling.  Gastrointestinal:  Negative for abdominal pain and constipation.  Genitourinary:  Positive for frequency. Negative for dysuria and urgency.  Musculoskeletal:  Positive for arthralgias, back pain and gait problem.  Skin:  Negative for color change and wound.  Neurological:  Negative for speech difficulty, weakness and light-headedness.       Memory lapses.   Psychiatric/Behavioral:  Positive for confusion. Negative for sleep disturbance. The patient is not nervous/anxious.        Baseline mentation.     Immunization History  Administered Date(s) Administered   DTaP  09/05/2004   Influenza, High Dose Seasonal PF 01/06/2014, 11/08/2018   Influenza, Quadrivalent, Recombinant, Inj, Pf 01/09/2018, 01/21/2019   Influenza-Unspecified 11/07/2013, 01/14/2020, 01/26/2021, 01/31/2022   Moderna Covid-19 Vaccine Bivalent Booster 28yr & up 04/04/2021   Moderna Sars-Covid-2 Vaccination 05/09/2019, 06/06/2019, 02/22/2020, 09/06/2020   Pneumococcal Conjugate-13 01/06/2014   Pneumococcal Polysaccharide-23 09/05/2004   Pneumococcal-Unspecified 09/05/2004, 10/18/2014   Zoster Recombinat (Shingrix) 03/21/2021, 05/24/2021   Pertinent  Health Maintenance Due  Topic Date Due   INFLUENZA VACCINE  Completed   DEXA SCAN  Completed  08/26/2021    9:19 AM 08/26/2021    2:00 PM 08/26/2021    8:30 PM 08/27/2021   10:00 AM 01/09/2022   12:36 PM  Fall Risk  Falls in the past year?     0  Was there an injury with Fall?     0  Fall Risk Category Calculator     0  Fall Risk Category (Retired)     Low  (RETIRED) Patient Fall Risk Level High fall risk High fall risk High fall risk High fall risk Low fall risk  Patient at Risk for Falls Due to     No Fall Risks  Fall risk Follow up     Falls evaluation completed   Functional Status Survey:    Vitals:   04/27/22 1131  BP: 104/81  Pulse: 97  Resp: 17  Temp: (!) 97 F (36.1 C)  SpO2: 96%  Weight: 104 lb (47.2 kg)  Height: '4\' 10"'$  (1.473 m)   Body mass index is 21.74 kg/m. Physical Exam Vitals and nursing note reviewed.  Constitutional:      Appearance: Normal appearance.  HENT:     Head: Normocephalic and atraumatic.     Nose: Nose normal.     Mouth/Throat:     Mouth: Mucous membranes are moist.  Eyes:     Extraocular Movements: Extraocular movements intact.     Conjunctiva/sclera: Conjunctivae normal.     Pupils: Pupils are equal, round, and reactive to light.  Cardiovascular:     Rate and Rhythm: Normal rate and regular rhythm.     Heart sounds: No murmur heard. Pulmonary:     Effort: Pulmonary effort is  normal.     Breath sounds: No rales.  Chest:     Chest wall: No tenderness.  Abdominal:     General: Bowel sounds are normal.     Palpations: Abdomen is soft.     Tenderness: There is no abdominal tenderness. There is no right CVA tenderness, left CVA tenderness, guarding or rebound.  Musculoskeletal:     Cervical back: Normal range of motion and neck supple.     Right lower leg: No edema.     Left lower leg: No edema.     Comments: The patient pointed her lower back, but denied when spinal spinous processes palpated, or with her movement, or with her walking.   Skin:    General: Skin is warm and dry.     Comments: Hammer toes most of her toes, the right 4th toe redness on the prominent DIP redness better Yellow, thick toe nails.   Neurological:     General: No focal deficit present.     Mental Status: She is alert and oriented to person, place, and time. Mental status is at baseline.     Motor: No weakness.     Coordination: Coordination normal.     Gait: Gait abnormal.     Comments: Ambulates with walker.   Psychiatric:        Mood and Affect: Mood normal.        Behavior: Behavior normal.        Thought Content: Thought content normal.     Labs reviewed: Recent Labs    08/13/21 1336 08/13/21 1420 08/16/21 0421 08/26/21 0919 08/26/21 1058 08/27/21 0404  NA  --    < > 141 141  --  142  K  --    < > 4.0 4.4  --  4.1  CL  --    < >  113* 111  --  109  CO2  --    < > 23 19*  --  26  GLUCOSE  --    < > 84 82  --  91  BUN  --    < > 21 16  --  14  CREATININE  --    < > 0.94 0.90  --  1.04*  CALCIUM  --    < > 8.1* 8.4*  --  8.4*  MG 2.3  --   --   --  1.4*  --    < > = values in this interval not displayed.   Recent Labs    06/08/21 0000 08/13/21 1027 08/26/21 1058  AST 23 37 27  ALT '17 25 12  '$ ALKPHOS 77 80 56  BILITOT  --  1.0 1.3*  PROT  --  6.0* 3.9*  ALBUMIN 3.6 3.5 2.3*   Recent Labs    06/08/21 0000 06/08/21 0000 08/13/21 1027 08/13/21 1420  08/16/21 0421 08/26/21 0919 08/27/21 0404  WBC 6.2   < > 8.5  --  7.0 5.1 6.1  NEUTROABS 4,303.00  --  6.7  --  5.3  --   --   HGB 13.3  --  14.3   < > 12.2 13.7 13.8  HCT 41  --  46.1*   < > 38.0 43.9 42.6  MCV  --    < > 97.7  --  95.7 97.6 94.5  PLT 199  --  264  --  176 168 162   < > = values in this interval not displayed.   Lab Results  Component Value Date   TSH 3.779 08/26/2021   Lab Results  Component Value Date   HGBA1C 5.9 (H) 11/04/2019   Lab Results  Component Value Date   CHOL 110 09/19/2021   HDL 55 09/19/2021   LDLCALC 41 09/19/2021   TRIG 56 09/19/2021   CHOLHDL 2.8 11/06/2019    Significant Diagnostic Results in last 30 days:  No results found.  Assessment/Plan Osteoarthritis, multiple sites  lower back, aches, ambulates with walker, will Tylenol '500mg'$  bid x 1 weeks.   Mixed hyperlipidemia  takes Atorvastatin, LDL 41 09/19/21  Anemia, iron deficiency 01/21/20 Iron 44, ferritin 63, Vit B12 695, Hgb 13.8 08/27/21  Stroke ([place])  11/02/19 CVA (MRI cevebral small acute infarcts in the posterior left hemisphere in the left PCA and MCA watershed area. Takes Eliquis.   Senile dementia ([place]) Senile dementia since CVA, no behavioral issues. MMSE 15/30 03/20/21  CKD (chronic kidney disease) stage 3, GFR 30-59 ml/min (HCC) Bun/creat 14/1.04 08/27/21  Depression, recurrent (HCC)  stable, off Sertraline.   GERD (gastroesophageal reflux disease) takes Omeprazole. Hgb 13.8 08/27/21  Paroxysmal SVT (supraventricular tachycardia) (HCC) off Amiodarone 01/2020 in hospital due to prolonged QT interval. On Metoprolol bid and prn,  Eliquis.   Edema not apparent, off Torsemide. BNP 798     Family/ staff Communication: plan of care reviewed with the patient and charge nurse.   Labs/tests ordered:  none  Time spend 35 minutes.

## 2022-04-27 NOTE — Assessment & Plan Note (Signed)
off Amiodarone 01/2020 in hospital due to prolonged QT interval. On Metoprolol bid and prn,  Eliquis.

## 2022-04-27 NOTE — Assessment & Plan Note (Signed)
11/02/19 CVA (MRI cevebral small acute infarcts in the posterior left hemisphere in the left PCA and MCA watershed area. Takes Eliquis.

## 2022-04-27 NOTE — Assessment & Plan Note (Signed)
not apparent, off Torsemide. BNP 798

## 2022-04-27 NOTE — Assessment & Plan Note (Signed)
takes Atorvastatin, LDL 41 09/19/21

## 2022-05-07 DIAGNOSIS — F330 Major depressive disorder, recurrent, mild: Secondary | ICD-10-CM | POA: Diagnosis not present

## 2022-05-07 DIAGNOSIS — G4700 Insomnia, unspecified: Secondary | ICD-10-CM | POA: Diagnosis not present

## 2022-06-01 DIAGNOSIS — F330 Major depressive disorder, recurrent, mild: Secondary | ICD-10-CM | POA: Diagnosis not present

## 2022-06-01 DIAGNOSIS — G4700 Insomnia, unspecified: Secondary | ICD-10-CM | POA: Diagnosis not present

## 2022-06-05 ENCOUNTER — Non-Acute Institutional Stay (SKILLED_NURSING_FACILITY): Payer: Medicare Other | Admitting: Family Medicine

## 2022-06-05 DIAGNOSIS — N1831 Chronic kidney disease, stage 3a: Secondary | ICD-10-CM

## 2022-06-05 DIAGNOSIS — F339 Major depressive disorder, recurrent, unspecified: Secondary | ICD-10-CM

## 2022-06-05 DIAGNOSIS — I471 Supraventricular tachycardia, unspecified: Secondary | ICD-10-CM | POA: Diagnosis not present

## 2022-06-05 DIAGNOSIS — R609 Edema, unspecified: Secondary | ICD-10-CM

## 2022-06-05 DIAGNOSIS — F0390 Unspecified dementia without behavioral disturbance: Secondary | ICD-10-CM

## 2022-06-05 NOTE — Progress Notes (Signed)
Provider:  Alain Honey, MD Location:      Place of Service:     PCP: Virgie Dad, MD Patient Care Team: Virgie Dad, MD as PCP - General (Internal Medicine)  Extended Emergency Contact Information Primary Emergency Contact: [place] Mobile Phone: [phone] Relation: [place] Secondary Emergency Contact: [place] Mobile Phone: [phone] Relation: Friend  Code Status:  Goals of Care: Advanced Directive information    04/27/2022   11:30 AM  Advanced Directives  Does Patient Have a Medical Advance Directive? Yes  Type of Paramedic of [place];Living will;Out of facility DNR (pink MOST or yellow form)  Does patient want to make changes to medical advance directive? No - Patient declined  Copy of [place] in Chart? Yes - validated most recent copy scanned in chart (See row information)  Pre-existing out of facility DNR order (yellow form or pink MOST form) Pink MOST/Yellow Form most recent copy in chart - Physician notified to receive inpatient order      No chief complaint on file.   HPI: Patient is a 89 y.o. female seen today for medical management of chronic problems including dementia with MMSE of 15 over 32 years ago.  She has also had CVA with several small infarcts in the posterior left hemisphere, PSVT, osteoarthritis. I met her in the living room.  She is able to converse smiles and laughs but cannot remember answers to simple questions that I ask her.  Example is nearby family to visit, could not answer She has no specific complaints today says that her appetite is good she sleeps okay she uses her walker for ambulation no recent falls by history. She is off amiodarone for her PSVT but continues on metoprolol.  Past Medical History:  Diagnosis Date   Atrial flutter, paroxysmal ([place])    COVID-19    Dementia (H[facility])    Diastolic dysfunction    Hyperlipidemia    Hypertension    PSVT (paroxysmal  supraventricular tachycardia)    Stroke ([place])    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 ([place])    No past surgical history on file.  reports that she has never smoked. She has never used smokeless tobacco. She reports that she does not drink alcohol and does not use drugs. Social History   Socioeconomic History   Marital status: Widowed    Spouse name: Not on file   Number of children: 0   Years of education: Not on file   Highest education level: Not on file  Occupational History   Not on file  Tobacco Use   Smoking status: Never   Smokeless tobacco: Never  Vaping Use   Vaping Use: Never used  Substance and Sexual Activity   Alcohol use: Never   Drug use: Never   Sexual activity: Not on file  Other Topics Concern   Not on file  Social History Narrative   Not on file   Social Determinants of Health   Financial Resource Strain: Not on file  Food Insecurity: No Food Insecurity (08/16/2021)   Hunger Vital Sign    Worried About Running Out of Food in the Last Year: Never true    Ran Out of Food in the Last Year: Never true  Transportation Needs: Not on file  Physical Activity: Not on file  Stress: Not on file  Social Connections: Not on file  Intimate Partner Violence: Not on file    Functional Status Survey:  No family history on file.  Health Maintenance  Topic Date Due   DTaP/Tdap/Td (2 - Tdap) 09/06/2014   COVID-19 Vaccine (6 - 2023-24 season) 12/08/2021   Medicare Annual Wellness (AWV)  03/14/2022   Pneumonia Vaccine 78+ Years old  Completed   INFLUENZA VACCINE  Completed   DEXA SCAN  Completed   Zoster Vaccines- Shingrix  Completed   HPV VACCINES  Aged Out    Allergies  Allergen Reactions   Alphagan P [Brimonidine Tartrate] Other (See Comments)    Unknown reaction Listed on MAR   Azopt [Brinzolamide] Other (See Comments)    Unknown reaction Listed on MAR   Lumigan [Bimatoprost] Other (See  Comments)    Unknown reaction Listed on [facility]    Outpatient Encounter Medications as of 06/05/2022  Medication Sig   acetaminophen (TYLENOL) 325 MG tablet Take 650 mg by mouth every 6 (six) hours as needed for headache (pain).   AMBULATORY NON FORMULARY MEDICATION DUKES MAGIC MOUTHWASH Give 5 mg/ml orally every 6 hours as needed for mouth care SWISH AND SPIT 4X DAILY BY MOUTH   apixaban (ELIQUIS) 2.5 MG TABS tablet Take 1 tablet (2.5 mg total) by mouth 2 (two) times daily.   atorvastatin (LIPITOR) 10 MG tablet Take 5 mg by mouth daily.   B Complex-Folic Acid (B COMPLEX VITAMINS, W/ FA, PO) Take 1 tablet by mouth daily. Vitamin B complex - Folic Acid 0.4 mg   bismuth subsalicylate (PEPTO BISMOL) 262 MG/15ML suspension Take 60 mLs by mouth daily as needed for indigestion or diarrhea or loose stools.    Calcium Carbonate-Vitamin D 600-400 MG-UNIT tablet Take 1 tablet by mouth daily.   lactose free nutrition (BOOST) LIQD Take 237 mLs by mouth daily. (Patient not taking: Reported on 04/27/2022)   melatonin 3 MG TABS tablet Take 3 mg by mouth at bedtime.   metoprolol tartrate (LOPRESSOR) 25 MG tablet Take 12.5 mg by mouth as needed. Give 0.5 tablet orally two times a day for SVT HOLD IF HR < 50   MULTIPLE VITAMIN-FOLIC ACID PO Take 1 tablet by mouth daily.   omeprazole (PRILOSEC) 20 MG capsule Take 20 mg by mouth daily.    Sodium Fluoride (PREVIDENT 5000 BOOSTER PLUS) 1.1 % PSTE Place 1 application. onto teeth at bedtime.   timolol (TIMOPTIC) 0.5 % ophthalmic solution Place 1 drop into both eyes daily.   triamcinolone ointment (KENALOG) 0.1 % Apply 1 application. topically 2 (two) times daily. (Patient not taking: Reported on 04/27/2022)   No facility-administered encounter medications on file as of 06/05/2022.    Review of Systems  Constitutional: Negative.   Eyes: Negative.   Respiratory: Negative.    Cardiovascular:  Positive for leg swelling.  Gastrointestinal: Negative.   Musculoskeletal:   Positive for arthralgias and gait problem.  Psychiatric/Behavioral:  Positive for confusion.     There were no vitals filed for this visit. There is no height or weight on file to calculate BMI. Physical Exam Vitals and nursing note reviewed.  Constitutional:      Appearance: Normal appearance.  HENT:     Head: Normocephalic.     Mouth/Throat:     Pharynx: Oropharynx is clear.  Eyes:     Conjunctiva/sclera: Conjunctivae normal.     Pupils: Pupils are equal, round, and reactive to light.  Cardiovascular:     Rate and Rhythm: Regular rhythm.  Pulmonary:     Effort: Pulmonary effort is normal.     Breath sounds: Normal breath sounds.  Abdominal:  General: Bowel sounds are normal.     Palpations: Abdomen is soft.  Musculoskeletal:        General: Normal range of motion.     Comments: Uses walker for ambulation  Neurological:     General: No focal deficit present.     Mental Status: She is alert.     Comments: Oriented to name but not place or time  Psychiatric:        Mood and Affect: Mood normal.        Behavior: Behavior normal.     Labs reviewed: Basic Metabolic Panel: Recent Labs    08/13/21 1336 08/13/21 1420 08/16/21 0421 08/26/21 0919 08/26/21 1058 08/27/21 0404  NA  --    < > 141 141  --  142  K  --    < > 4.0 4.4  --  4.1  CL  --    < > 113* 111  --  109  CO2  --    < > 23 19*  --  26  GLUCOSE  --    < > 84 82  --  91  BUN  --    < > 21 16  --  14  CREATININE  --    < > 0.94 0.90  --  1.04*  CALCIUM  --    < > 8.1* 8.4*  --  8.4*  MG 2.3  --   --   --  1.4*  --    < > = values in this interval not displayed.   Liver Function Tests: Recent Labs    06/08/21 0000 08/13/21 1027 08/26/21 1058  AST 23 37 27  ALT '17 25 12  '$ ALKPHOS 77 80 56  BILITOT  --  1.0 1.3*  PROT  --  6.0* 3.9*  ALBUMIN 3.6 3.5 2.3*   Recent Labs    08/13/21 1027  LIPASE 31   No results for input(s): "AMMONIA" in the last 8760 hours. CBC: Recent Labs     06/08/21 0000 06/08/21 0000 08/13/21 1027 08/13/21 1420 08/16/21 0421 08/26/21 0919 08/27/21 0404  WBC 6.2   < > 8.5  --  7.0 5.1 6.1  NEUTROABS 4,303.00  --  6.7  --  5.3  --   --   HGB 13.3  --  14.3   < > 12.2 13.7 13.8  HCT 41  --  46.1*   < > 38.0 43.9 42.6  MCV  --    < > 97.7  --  95.7 97.6 94.5  PLT 199  --  264  --  176 168 162   < > = values in this interval not displayed.   Cardiac Enzymes: No results for input(s): "CKTOTAL", "CKMB", "CKMBINDEX", "TROPONINI" in the last 8760 hours. BNP: Invalid input(s): "POCBNP" Lab Results  Component Value Date   HGBA1C 5.9 (H) 11/04/2019   Lab Results  Component Value Date   TSH 3.779 08/26/2021   Lab Results  Component Value Date   [id number] 01/21/2020   No results found for: "FOLATE" Lab Results  Component Value Date   IRON 44 01/21/2020   TIBC 273 01/21/2020   FERRITIN 63 01/21/2020    Imaging and Procedures obtained prior to SNF admission: ECHOCARDIOGRAM COMPLETE  Result Date: 08/27/2021    ECHOCARDIOGRAM REPORT   Patient Name:   SEPIDEH ESCOTT Date of Exam: 08/27/2021 Medical Rec #:  [id number]        Height:  58.0 in Accession #:    [id number]       Weight:       114.9 lb Date of Birth:  1922/10/18         BSA:          1.439 m Patient Age:    71 years         BP:           103/92 mmHg Patient Gender: F                HR:           124 bpm. Exam Location:  Inpatient Procedure: 2D Echo, Cardiac Doppler and Color Doppler Indications:    CHF  History:        Patient has prior history of Echocardiogram examinations, most                 recent 11/05/2019. Arrythmias:Tachycardia.  Sonographer:    Jefferey Pica Referring Phys: [id number] RAVI PAHWANI IMPRESSIONS  1. Left ventricular ejection fraction, by estimation, is 60 to 65%. The left ventricle has normal function. The left ventricle has no regional wall motion abnormalities. Indeterminate diastolic filling due to E-A fusion.  2. Right ventricular systolic function  is moderately reduced. The right ventricular size is mildly enlarged. There is moderately elevated pulmonary artery systolic pressure.  3. Left atrial size was mildly dilated.  4. Right atrial size was mildly dilated.  5. The mitral valve is normal in structure. Trivial mitral valve regurgitation. No evidence of mitral stenosis.  6. Tricuspid valve regurgitation is severe.  7. The aortic valve is tricuspid. Aortic valve regurgitation is mild. No aortic stenosis is present.  8. The inferior vena cava is dilated in size with <50% respiratory variability, suggesting right atrial pressure of 15 mmHg. Comparison(s): No significant change from prior study. Prior images reviewed side by side. The right ventricular hypertrophy is worse. Systolic PA pressure is higher and tricuspid insufficiency is worse. FINDINGS  Left Ventricle: Left ventricular ejection fraction, by estimation, is 60 to 65%. The left ventricle has normal function. The left ventricle has no regional wall motion abnormalities. The left ventricular internal cavity size was normal in size. There is  no left ventricular hypertrophy. Indeterminate diastolic filling due to E-A fusion. Right Ventricle: The right ventricular size is mildly enlarged. No increase in right ventricular wall thickness. Right ventricular systolic function is moderately reduced. There is moderately elevated pulmonary artery systolic pressure. The tricuspid regurgitant velocity is 3.23 m/s, and with an assumed right atrial pressure of 15 mmHg, the estimated right ventricular systolic pressure is 123XX123 mmHg. Left Atrium: Left atrial size was mildly dilated. Right Atrium: Right atrial size was mildly dilated. Pericardium: There is no evidence of pericardial effusion. Mitral Valve: The mitral valve is normal in structure. Trivial mitral valve regurgitation. No evidence of mitral valve stenosis. Tricuspid Valve: The tricuspid valve is normal in structure. Tricuspid valve regurgitation is  severe. No evidence of tricuspid stenosis. Aortic Valve: The aortic valve is tricuspid. Aortic valve regurgitation is mild. No aortic stenosis is present. Aortic valve peak gradient measures 3.8 mmHg. Pulmonic Valve: The pulmonic valve was normal in structure. Pulmonic valve regurgitation is trivial. No evidence of pulmonic stenosis. Aorta: The aortic root is normal in size and structure. Venous: The inferior vena cava is dilated in size with less than 50% respiratory variability, suggesting right atrial pressure of 15 mmHg. The inferior vena cava and the hepatic vein show a pattern of  systolic flow reversal, suggestive of tricuspid regurgitation. IAS/Shunts: No atrial level shunt detected by color flow Doppler.  LEFT VENTRICLE PLAX 2D LVIDd:         2.70 cm LVIDs:         2.05 cm LV PW:         1.10 cm LV IVS:        1.10 cm LVOT diam:     1.80 cm LV SV:         32 LV SV Index:   22 LVOT Area:     2.54 cm  RIGHT VENTRICLE            IVC RV Basal diam:  4.10 cm    IVC diam: 2.20 cm RV Mid diam:    3.70 cm RV S prime:     6.39 cm/s LEFT ATRIUM             Index        RIGHT ATRIUM           Index LA diam:        3.90 cm 2.71 cm/m   RA Area:     18.20 cm LA Vol (A2C):   39.8 ml 27.66 ml/m  RA Volume:   53.00 ml  36.83 ml/m LA Vol (A4C):   34.9 ml 24.25 ml/m LA Biplane Vol: 38.6 ml 26.83 ml/m  AORTIC VALVE                 PULMONIC VALVE AV Area (Vmax): 2.00 cm     PV Vmax:       0.60 m/s AV Vmax:        97.70 cm/s   PV Peak grad:  1.4 mmHg AV Peak Grad:   3.8 mmHg LVOT Vmax:      76.90 cm/s LVOT Vmean:     45.200 cm/s LVOT VTI:       0.125 m  AORTA Ao Root diam: 2.90 cm Ao Asc diam:  2.80 cm TRICUSPID VALVE TR Peak grad:   41.7 mmHg TR Vmax:        323.00 cm/s  SHUNTS Systemic VTI:  0.12 m Systemic Diam: 1.80 cm Dani Gobble Croitoru MD Electronically signed by Sanda Klein MD Signature Date/Time: 08/27/2021/12:15:15 PM    Final    DG Chest 2 View  Result Date: 08/26/2021 CLINICAL DATA:  Chest pain.  Bradycardia.  EXAM: CHEST - 2 VIEW COMPARISON:  08/13/2021 FINDINGS: Moderate cardiomegaly remains stable. New small right pleural effusion. Increased diffuse interstitial prominence suspicious for mild pulmonary edema. No evidence of pulmonary consolidation. IMPRESSION: Mild congestive heart failure with small right pleural effusion. Electronically Signed   By: Marlaine Hind M.D.   On: 08/26/2021 10:14    Assessment/Plan 1. Stage 3a chronic kidney disease ([place]) Stage IIIa this is consistent past 9 months  2. Depression, recurrent ([place]) On sertraline  3. Edema, unspecified type No edema today.  Off torsemide  4. Paroxysmal SVT (supraventricular tachycardia) Off amiodarone but continues to take metoprolol  5. Senile dementia [facility]) He is obviously lacking in MMSE supports diagnosis of medication just supportive care here at facility    Family/ staff Communication:   Labs/tests ordered:  Lillette Boxer. Sabra Heck, [place] [address] [place], [place] Office [phone]

## 2022-06-07 DIAGNOSIS — I471 Supraventricular tachycardia, unspecified: Secondary | ICD-10-CM | POA: Diagnosis not present

## 2022-06-08 ENCOUNTER — Non-Acute Institutional Stay (SKILLED_NURSING_FACILITY): Payer: Medicare Other | Admitting: Nurse Practitioner

## 2022-06-08 ENCOUNTER — Encounter: Payer: Self-pay | Admitting: Nurse Practitioner

## 2022-06-08 DIAGNOSIS — I517 Cardiomegaly: Secondary | ICD-10-CM | POA: Diagnosis not present

## 2022-06-08 DIAGNOSIS — J439 Emphysema, unspecified: Secondary | ICD-10-CM | POA: Diagnosis not present

## 2022-06-08 DIAGNOSIS — I471 Supraventricular tachycardia, unspecified: Secondary | ICD-10-CM

## 2022-06-08 DIAGNOSIS — K219 Gastro-esophageal reflux disease without esophagitis: Secondary | ICD-10-CM

## 2022-06-08 DIAGNOSIS — R059 Cough, unspecified: Secondary | ICD-10-CM | POA: Diagnosis not present

## 2022-06-08 NOTE — Assessment & Plan Note (Signed)
takes Omeprazole. Hgb 13.8 08/27/21

## 2022-06-08 NOTE — Assessment & Plan Note (Addendum)
cough, non productive, denied congestion, sore throat, chest pain or SOB, not associated with swallowing, afebrile, no O2 desaturation. Will try Mucinex 600mg  bid, DuoNeb q8hrs x 5 days, CXR ap/lateral to r/o PNA, test for COVID. Update CBC/diff, CMP/eGFR, TSH 06/08/22 CXR no acute cardiopulmonary disease.  06/12/22 Na 141, K 4.3, Bun 22, creat 0.96, TSH 3.96, wbc 5.3, Hgb 14.9, plt 221, neutrophils 67.8

## 2022-06-08 NOTE — Progress Notes (Addendum)
Location:   SNF [place] Room Number: 40A Place of Service:  SNF (31) Provider: [facility]renity Batley NP  Virgie Dad, MD  Patient Care Team: Virgie Dad, MD as PCP - General (Internal Medicine)  Extended Emergency Contact Information Primary Emergency Contact: [place] Mobile Phone: [phone] Relation: [place] Secondary Emergency Contact: [place] Mobile Phone: [phone] Relation: Friend  Code Status: DNR Goals of care: Advanced Directive information    04/27/2022   11:30 AM  Advanced Directives  Does Patient Have a Medical Advance Directive? Yes  Type of Paramedic of [place];Living will;Out of facility DNR (pink MOST or yellow form)  Does patient want to make changes to medical advance directive? No - Patient declined  Copy of [place] in Chart? Yes - validated most recent copy scanned in chart (See row information)  Pre-existing out of facility DNR order (yellow form or pink MOST form) Pink MOST/Yellow Form most recent copy in chart - Physician notified to receive inpatient order     Chief Complaint  Patient presents with   Acute Visit    cough    HPI:  Pt is a 89 y.o. female seen today for an acute visit for cough, non productive, denied congestion, sore throat, chest pain or SOB, not associated with swallowing, afebrile, no O2 desaturation.   Hx of emphysema.  Edema, not apparent, off Torsemide. BNP 798 08/26/21, echo 08/27/21 EF 60-65%             PSVT off Amiodarone 01/2020 in hospital due to prolonged QT interval. On Metoprolol bid and prn,  Eliquis.              GERD, takes Omeprazole. Hgb 13.8 08/27/21             Depression, stable, off Sertraline.              CKD Bun/creat 14/1.04 08/27/21             Senile dementia since CVA, no behavioral issues. MMSE 15/30 03/20/21             11/02/19 CVA (MRI cevebral small acute infarcts in the posterior left hemisphere in the left PCA and MCA watershed  area. Takes Eliquis.              Anemia, 01/21/20 Iron 44, ferritin 63, Vit B12 695, Hgb 13.8 08/27/21             Hyperlipidemia, takes Atorvastatin, LDL 41 09/19/21             OA lower back, aches, takes Tylenol.        Past Medical History:  Diagnosis Date   Atrial flutter, paroxysmal ([place])    COVID-19    Dementia (HCC)    Diastolic dysfunction    Hyperlipidemia    Hypertension    PSVT (paroxysmal supraventricular tachycardia)    Stroke ([place])    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 ([place])    History reviewed. No pertinent surgical history.  Allergies  Allergen Reactions   Alphagan P [Brimonidine Tartrate] Other (See Comments)    Unknown reaction Listed on MAR   Azopt [Brinzolamide] Other (See Comments)    Unknown reaction Listed on MAR   Lumigan [Bimatoprost] Other (See Comments)    Unknown reaction Listed on MAR    Allergies as of 06/08/2022       Reactions   Alphagan P [brimonidine Tartrate] Other (See Comments)  Unknown reaction Listed on MAR   Azopt [brinzolamide] Other (See Comments)   Unknown reaction Listed on MAR   Lumigan [bimatoprost] Other (See Comments)   Unknown reaction Listed on [facility]        Medication List        Accurate as of June 08, 2022 12:54 PM. If you have any questions, ask your nurse or doctor.          acetaminophen 325 MG tablet Commonly known as: TYLENOL Take 650 mg by mouth every 6 (six) hours as needed for headache (pain).   AMBULATORY NON FORMULARY MEDICATION DUKES MAGIC MOUTHWASH Give 5 mg/ml orally every 6 hours as needed for mouth care SWISH AND SPIT 4X DAILY BY MOUTH   apixaban 2.5 MG Tabs tablet Commonly known as: ELIQUIS Take 1 tablet (2.5 mg total) by mouth 2 (two) times daily.   atorvastatin 10 MG tablet Commonly known as: LIPITOR Take 5 mg by mouth daily.   B COMPLEX VITAMINS (W/ FA) PO Take 1 tablet by mouth daily. Vitamin B complex - Folic Acid  0.4 mg   bismuth subsalicylate [id number] suspension Commonly known as: PEPTO BISMOL Take 60 mLs by mouth daily as needed for indigestion or diarrhea or loose stools.   Calcium Carbonate-Vitamin D 600-400 MG-UNIT tablet Take 1 tablet by mouth daily.   lactose free nutrition Liqd Take 237 mLs by mouth daily.   melatonin 3 MG Tabs tablet Take 3 mg by mouth at bedtime.   metoprolol tartrate 25 MG tablet Commonly known as: LOPRESSOR Take 12.5 mg by mouth as needed. Give 0.5 tablet orally two times a day for SVT HOLD IF HR < 50   MULTIPLE VITAMIN-FOLIC ACID PO Take 1 tablet by mouth daily.   omeprazole 20 MG capsule Commonly known as: PRILOSEC Take 20 mg by mouth daily.   PreviDent 5000 Booster Plus 1.1 % Pste Generic drug: Sodium Fluoride Place 1 application. onto teeth at bedtime.   timolol 0.5 % ophthalmic solution Commonly known as: TIMOPTIC Place 1 drop into both eyes daily.   triamcinolone ointment 0.1 % Commonly known as: KENALOG Apply 1 application. topically 2 (two) times daily.        Review of Systems  Constitutional:  Negative for activity change, fever and unexpected weight change.  HENT:  Positive for hearing loss. Negative for congestion and trouble swallowing.   Eyes:  Negative for visual disturbance.  Respiratory:  Positive for cough. Negative for shortness of breath and wheezing.   Cardiovascular:  Negative for leg swelling.  Gastrointestinal:  Negative for abdominal pain and constipation.  Genitourinary:  Positive for frequency. Negative for dysuria and urgency.  Musculoskeletal:  Positive for arthralgias, back pain and gait problem.  Skin:  Negative for color change.  Neurological:  Negative for speech difficulty, weakness and light-headedness.       Memory lapses.   Psychiatric/Behavioral:  Positive for confusion. Negative for sleep disturbance. The patient is not nervous/anxious.        Baseline mentation.     Immunization History   Administered Date(s) Administered   DTaP 09/05/2004   Influenza, High Dose Seasonal PF 01/06/2014, 11/08/2018   Influenza, Quadrivalent, Recombinant, Inj, Pf 01/09/2018, 01/21/2019   Influenza-Unspecified 11/07/2013, 01/14/2020, 01/26/2021, 01/31/2022   Moderna Covid-19 Vaccine Bivalent Booster 38yr & up 04/04/2021   Moderna Sars-Covid-2 Vaccination 05/09/2019, 06/06/2019, 02/22/2020, 09/06/2020   Pneumococcal Conjugate-13 01/06/2014   Pneumococcal Polysaccharide-23 09/05/2004   Pneumococcal-Unspecified 09/05/2004, 10/18/2014   Zoster Recombinat (Shingrix) 03/21/2021, 05/24/2021  Pertinent  Health Maintenance Due  Topic Date Due   INFLUENZA VACCINE  Completed   DEXA SCAN  Completed      08/26/2021    9:19 AM 08/26/2021    2:00 PM 08/26/2021    8:30 PM 08/27/2021   10:00 AM 01/09/2022   12:36 PM  Fall Risk  Falls in the past year?     0  Was there an injury with Fall?     0  Fall Risk Category Calculator     0  Fall Risk Category (Retired)     Low  (RETIRED) Patient Fall Risk Level High fall risk High fall risk High fall risk High fall risk Low fall risk  Patient at Risk for Falls Due to     No Fall Risks  Fall risk Follow up     Falls evaluation completed   Functional Status Survey:    Vitals:   06/08/22 1241  BP: 115/67  Pulse: (!) 56  Resp: 17  Temp: (!) 97.3 F (36.3 C)  SpO2: 98%  Weight: 101 lb 6.4 oz (46 kg)   Body mass index is 21.19 kg/m. Physical Exam Vitals and nursing note reviewed.  Constitutional:      Appearance: Normal appearance.  HENT:     Head: Normocephalic and atraumatic.     Nose: Nose normal.     Mouth/Throat:     Mouth: Mucous membranes are moist.  Eyes:     Extraocular Movements: Extraocular movements intact.     Conjunctiva/sclera: Conjunctivae normal.     Pupils: Pupils are equal, round, and reactive to light.  Cardiovascular:     Rate and Rhythm: Normal rate and regular rhythm.     Heart sounds: No murmur heard. Pulmonary:      Effort: Pulmonary effort is normal.     Breath sounds: Rhonchi present. No wheezing or rales.     Comments: Posterior right mid to lower lung scattered rhonchi, mostly inspiratory phase.  Chest:     Chest wall: No tenderness.  Abdominal:     General: Bowel sounds are normal.     Palpations: Abdomen is soft.     Tenderness: There is no abdominal tenderness.  Musculoskeletal:     Cervical back: Normal range of motion and neck supple.     Right lower leg: No edema.     Left lower leg: No edema.     Comments: The patient pointed her lower back, but denied when spinal spinous processes palpated, or with her movement, or with her walking.   Skin:    General: Skin is warm and dry.     Comments: Hammer toes most of her toes Yellow, thick toe nails.   Neurological:     General: No focal deficit present.     Mental Status: She is alert and oriented to person, place, and time. Mental status is at baseline.     Gait: Gait abnormal.     Comments: Ambulates with walker.   Psychiatric:        Mood and Affect: Mood normal.        Behavior: Behavior normal.        Thought Content: Thought content normal.     Labs reviewed: Recent Labs    08/13/21 1336 08/13/21 1420 08/16/21 0421 08/26/21 0919 08/26/21 1058 08/27/21 0404  NA  --    < > 141 141  --  142  K  --    < > 4.0 4.4  --  4.1  CL  --    < > 113* 111  --  109  CO2  --    < > 23 19*  --  26  GLUCOSE  --    < > 84 82  --  91  BUN  --    < > 21 16  --  14  CREATININE  --    < > 0.94 0.90  --  1.04*  CALCIUM  --    < > 8.1* 8.4*  --  8.4*  MG 2.3  --   --   --  1.4*  --    < > = values in this interval not displayed.   Recent Labs    08/13/21 1027 08/26/21 1058  AST 37 27  ALT 25 12  ALKPHOS 80 56  BILITOT 1.0 1.3*  PROT 6.0* 3.9*  ALBUMIN 3.5 2.3*   Recent Labs    08/13/21 1027 08/13/21 1420 08/16/21 0421 08/26/21 0919 08/27/21 0404  WBC 8.5  --  7.0 5.1 6.1  NEUTROABS 6.7  --  5.3  --   --   HGB 14.3   < > 12.2  13.7 13.8  HCT 46.1*   < > 38.0 43.9 42.6  MCV 97.7  --  95.7 97.6 94.5  PLT 264  --  176 168 162   < > = values in this interval not displayed.   Lab Results  Component Value Date   TSH 3.779 08/26/2021   Lab Results  Component Value Date   HGBA1C 5.9 (H) 11/04/2019   Lab Results  Component Value Date   CHOL 110 09/19/2021   HDL 55 09/19/2021   LDLCALC 41 09/19/2021   TRIG 56 09/19/2021   CHOLHDL 2.8 11/06/2019    Significant Diagnostic Results in last 30 days:  No results found.  Assessment/Plan: Emphysema of lung (HCC) cough, non productive, denied congestion, sore throat, chest pain or SOB, not associated with swallowing, afebrile, no O2 desaturation. Will try Mucinex '600mg'$  bid, DuoNeb q8hrs x 5 days, CXR ap/lateral to r/o PNA, test for COVID. Update CBC/diff, CMP/eGFR, TSH  Paroxysmal SVT (supraventricular tachycardia) (HCC)  PSVT off Amiodarone 01/2020 in hospital due to prolonged QT interval. On Metoprolol bid and prn,  Eliquis.   GERD (gastroesophageal reflux disease)  takes Omeprazole. Hgb 13.8 08/27/21    Family/ staff Communication: plan of care reviewed with the patient and charge nurse.   Labs/tests ordered:  CXR ap/lateral, CBC/diff, CMP/eGFR, TSH  Time spend 35 minutes

## 2022-06-08 NOTE — Assessment & Plan Note (Signed)
PSVT off Amiodarone 01/2020 in hospital due to prolonged QT interval. On Metoprolol bid and prn,  Eliquis.

## 2022-06-11 DIAGNOSIS — R1312 Dysphagia, oropharyngeal phase: Secondary | ICD-10-CM | POA: Diagnosis not present

## 2022-06-12 DIAGNOSIS — I10 Essential (primary) hypertension: Secondary | ICD-10-CM | POA: Diagnosis not present

## 2022-06-12 LAB — COMPREHENSIVE METABOLIC PANEL
Albumin: 3.7 (ref 3.5–5.0)
Calcium: 8.8 (ref 8.7–10.7)
Globulin: 2.2
eGFR: 54

## 2022-06-12 LAB — CBC AND DIFFERENTIAL
HCT: 45 (ref 36–46)
Hemoglobin: 14.9 (ref 12.0–16.0)
Neutrophils Absolute: 3593
Platelets: 221 10*3/uL (ref 150–400)
WBC: 5.3

## 2022-06-12 LAB — HEPATIC FUNCTION PANEL
ALT: 15 U/L (ref 7–35)
AST: 23 (ref 13–35)
Alkaline Phosphatase: 76 (ref 25–125)
Bilirubin, Total: 0.6

## 2022-06-12 LAB — BASIC METABOLIC PANEL
BUN: 22 — AB (ref 4–21)
CO2: 33 — AB (ref 13–22)
Chloride: 104 (ref 99–108)
Creatinine: 1 (ref 0.5–1.1)
Glucose: 89
Potassium: 4.3 mEq/L (ref 3.5–5.1)
Sodium: 141 (ref 137–147)

## 2022-06-12 LAB — TSH: TSH: 3.96 (ref 0.41–5.90)

## 2022-06-12 LAB — CBC: RBC: 4.82 (ref 3.87–5.11)

## 2022-06-13 DIAGNOSIS — R1312 Dysphagia, oropharyngeal phase: Secondary | ICD-10-CM | POA: Diagnosis not present

## 2022-06-14 DIAGNOSIS — R1312 Dysphagia, oropharyngeal phase: Secondary | ICD-10-CM | POA: Diagnosis not present

## 2022-06-19 ENCOUNTER — Other Ambulatory Visit: Payer: Self-pay | Admitting: Nurse Practitioner

## 2022-06-19 DIAGNOSIS — R1312 Dysphagia, oropharyngeal phase: Secondary | ICD-10-CM | POA: Diagnosis not present

## 2022-06-20 DIAGNOSIS — R1312 Dysphagia, oropharyngeal phase: Secondary | ICD-10-CM | POA: Diagnosis not present

## 2022-06-22 DIAGNOSIS — R1312 Dysphagia, oropharyngeal phase: Secondary | ICD-10-CM | POA: Diagnosis not present

## 2022-06-25 ENCOUNTER — Encounter: Payer: Self-pay | Admitting: Nurse Practitioner

## 2022-06-25 ENCOUNTER — Non-Acute Institutional Stay (SKILLED_NURSING_FACILITY): Payer: Medicare Other | Admitting: Nurse Practitioner

## 2022-06-25 DIAGNOSIS — F339 Major depressive disorder, recurrent, unspecified: Secondary | ICD-10-CM | POA: Diagnosis not present

## 2022-06-25 DIAGNOSIS — F0390 Unspecified dementia without behavioral disturbance: Secondary | ICD-10-CM

## 2022-06-25 DIAGNOSIS — J439 Emphysema, unspecified: Secondary | ICD-10-CM | POA: Diagnosis not present

## 2022-06-25 DIAGNOSIS — I471 Supraventricular tachycardia, unspecified: Secondary | ICD-10-CM

## 2022-06-25 DIAGNOSIS — K219 Gastro-esophageal reflux disease without esophagitis: Secondary | ICD-10-CM | POA: Diagnosis not present

## 2022-06-25 DIAGNOSIS — R1312 Dysphagia, oropharyngeal phase: Secondary | ICD-10-CM | POA: Diagnosis not present

## 2022-06-25 DIAGNOSIS — I959 Hypotension, unspecified: Secondary | ICD-10-CM

## 2022-06-25 NOTE — Assessment & Plan Note (Signed)
since CVA, no behavioral issues. MMSE 15/30 03/20/21

## 2022-06-25 NOTE — Progress Notes (Signed)
Location:  [place] Room Number: 40A Place of Service:  SNF (31) Provider:  Janeen [name] X, NP    Patient Care Team: Virgie Dad, MD as PCP - General (Internal Medicine)  Extended Emergency Contact Information Primary Emergency Contact: [place] Mobile Phone: [phone] Relation: [place] Secondary Emergency Contact: [place] Mobile Phone: [phone] Relation: Friend  Code Status:  DNR Goals of care: Advanced Directive information    06/25/2022   12:28 PM  Advanced Directives  Does Patient Have a Medical Advance Directive? Yes  Type of Paramedic of [place];Living will;Out of facility DNR (pink MOST or yellow form)  Does patient want to make changes to medical advance directive? No - Patient declined  Copy of [place] in Chart? Yes - validated most recent copy scanned in chart (See row information)  Pre-existing out of facility DNR order (yellow form or pink MOST form) Pink MOST/Yellow Form most recent copy in chart - Physician notified to receive inpatient order     Chief Complaint  Patient presents with  . Medical Management of Chronic Issues    Routine Visit  . Quality Metric Gaps    Need to discuss DTAP, COVID-19 Vaccine and Medicare Annual Wellnes     HPI:  Pt is a 89 y.o. female seen today for medical management of chronic diseases.     Hx of emphysema, stable.  Edema, not apparent, off Torsemide. BNP 798 08/26/21, echo 08/27/21 EF 60-65%             PSVT off Amiodarone 01/2020 in hospital due to prolonged QT interval. On Metoprolol bid and prn, on Eliquis             GERD, takes Omeprazole. Hgb 13.8 08/27/21             Depression, stable, off Sertraline.              CKD Bun/creat 14/1.04 08/27/21             Senile dementia since CVA, no behavioral issues. MMSE 15/30 03/20/21             11/02/19 CVA (MRI cevebral small acute infarcts in the posterior left hemisphere in the left PCA  and MCA watershed area. Takes Eliquis.              Anemia, 01/21/20 Iron 44, ferritin 63, Vit B12 695, Hgb 13.8 08/27/21             Hyperlipidemia, takes Atorvastatin, LDL 41 09/19/21             OA lower back, aches, ambulates with walker.                        Past Medical History:  Diagnosis Date  . Atrial flutter, paroxysmal ([place])   . COVID-19   . Dementia ([place])   . Diastolic dysfunction   . Hyperlipidemia   . Hypertension   . PSVT (paroxysmal supraventricular tachycardia)   . Stroke ([place])   . 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 ([place])    History reviewed. No pertinent surgical history.  Allergies  Allergen Reactions  . Alphagan P [Brimonidine Tartrate] Other (See Comments)    Unknown reaction Listed on MAR  . Azopt [Brinzolamide] Other (See Comments)    Unknown reaction Listed on MAR  . Lumigan [Bimatoprost] Other (See Comments)    Unknown reaction Listed on [facility]  Outpatient Encounter Medications as of 06/25/2022  Medication Sig  . AMBULATORY NON FORMULARY MEDICATION DUKES MAGIC MOUTHWASH Give 5 mg/ml orally every 6 hours as needed for mouth care SWISH AND SPIT 4X DAILY BY MOUTH  . apixaban (ELIQUIS) 2.5 MG TABS tablet Take 1 tablet (2.5 mg total) by mouth 2 (two) times daily.  Marland Kitchen atorvastatin (LIPITOR) 10 MG tablet Take 5 mg by mouth daily.  . B Complex-Folic Acid (B COMPLEX VITAMINS, W/ FA, PO) Take 1 tablet by mouth daily. Vitamin B complex - Folic Acid 0.4 mg  . bismuth subsalicylate (PEPTO BISMOL) 262 MG/15ML suspension Take 60 mLs by mouth daily as needed for indigestion or diarrhea or loose stools.   . Calcium Carbonate-Vitamin D 600-400 MG-UNIT tablet Take 1 tablet by mouth daily.  . melatonin 3 MG TABS tablet Take 3 mg by mouth at bedtime.  . metoprolol tartrate (LOPRESSOR) 25 MG tablet Take 12.5 mg by mouth as needed. Give 0.5 tablet orally two times a day for SVT HOLD IF HR < 50  . MULTIPLE  VITAMIN-FOLIC ACID PO Take 1 tablet by mouth daily.  Marland Kitchen omeprazole (PRILOSEC) 20 MG capsule Take 20 mg by mouth daily.   . Sodium Fluoride (PREVIDENT 5000 BOOSTER PLUS) 1.1 % PSTE Place 1 application. onto teeth at bedtime.  . timolol (TIMOPTIC) 0.5 % ophthalmic solution Place 1 drop into both eyes daily.  . [DISCONTINUED] lactose free nutrition (BOOST) LIQD Take 237 mLs by mouth daily. (Patient not taking: Reported on 04/27/2022)  . [DISCONTINUED] triamcinolone ointment (KENALOG) 0.1 % Apply 1 application. topically 2 (two) times daily. (Patient not taking: Reported on 04/27/2022)   No facility-administered encounter medications on file as of 06/25/2022.    Review of Systems  Constitutional:  Negative for activity change and fever.  HENT:  Positive for hearing loss. Negative for congestion and trouble swallowing.   Eyes:  Negative for visual disturbance.  Respiratory:  Negative for cough, shortness of breath and wheezing.   Cardiovascular:  Negative for leg swelling.  Gastrointestinal:  Negative for abdominal pain and constipation.  Genitourinary:  Positive for frequency. Negative for dysuria and urgency.  Musculoskeletal:  Positive for arthralgias, back pain and gait problem.  Skin:  Negative for color change.  Neurological:  Negative for speech difficulty, weakness and light-headedness.       Memory lapses.   Psychiatric/Behavioral:  Positive for confusion. Negative for sleep disturbance. The patient is not nervous/anxious.        Baseline mentation.     Immunization History  Administered Date(s) Administered  . DTaP 09/05/2004  . Influenza, High Dose Seasonal PF 01/06/2014, 11/08/2018  . Influenza, Quadrivalent, Recombinant, Inj, Pf 01/09/2018, 01/21/2019  . Influenza-Unspecified 11/07/2013, 01/14/2020, 01/26/2021, 01/31/2022  . Moderna Covid-19 Vaccine Bivalent Booster 65yrs & up 04/04/2021  . Moderna Sars-Covid-2 Vaccination 05/09/2019, 06/06/2019, 02/22/2020, 09/06/2020  .  Pneumococcal Conjugate-13 01/06/2014  . Pneumococcal Polysaccharide-23 09/05/2004  . Pneumococcal-Unspecified 09/05/2004, 10/18/2014  . Zoster Recombinat (Shingrix) 03/21/2021, 05/24/2021   Pertinent  Health Maintenance Due  Topic Date Due  . INFLUENZA VACCINE  Completed  . DEXA SCAN  Completed      08/26/2021    2:00 PM 08/26/2021    8:30 PM 08/27/2021   10:00 AM 01/09/2022   12:36 PM 06/25/2022   12:28 PM  Fall Risk  Falls in the past year?    0 0  Was there an injury with Fall?    0 0  Fall Risk Category Calculator    0 0  Fall Risk Category (Retired)    Low   (RETIRED) Patient Fall Risk Level High fall risk High fall risk High fall risk Low fall risk   Patient at Risk for Falls Due to    No Fall Risks No Fall Risks  Fall risk Follow up    Falls evaluation completed Falls evaluation completed   Functional Status Survey:    Vitals:   06/25/22 1222  BP: (!) 100/54  Pulse: (!) 53  Resp: 20  Temp: (!) 96.5 F (35.8 C)  SpO2: 98%  Weight: 101 lb 4 oz (45.9 kg)  Height: 4\' 10"  (1.473 m)   Body mass index is 21.16 kg/m. Physical Exam Vitals and nursing note reviewed.  Constitutional:      Appearance: Normal appearance.  HENT:     Head: Normocephalic and atraumatic.     Nose: Nose normal.     Mouth/Throat:     Mouth: Mucous membranes are moist.  Eyes:     Extraocular Movements: Extraocular movements intact.     Conjunctiva/sclera: Conjunctivae normal.     Pupils: Pupils are equal, round, and reactive to light.  Cardiovascular:     Rate and Rhythm: Normal rate and regular rhythm.     Heart sounds: No murmur heard. Pulmonary:     Effort: Pulmonary effort is normal.     Breath sounds: No rales.  Abdominal:     General: Bowel sounds are normal.     Palpations: Abdomen is soft.     Tenderness: There is no abdominal tenderness.  Musculoskeletal:     Cervical back: Normal range of motion and neck supple.     Right lower leg: No edema.     Left lower leg: No edema.   Skin:    General: Skin is warm and dry.     Comments: Hammer toes most of her toes Yellow, thick toe nails.   Neurological:     General: No focal deficit present.     Mental Status: She is alert and oriented to person, place, and time. Mental status is at baseline.     Gait: Gait abnormal.     Comments: Ambulates with walker.   Psychiatric:        Mood and Affect: Mood normal.        Behavior: Behavior normal.        Thought Content: Thought content normal.    Labs reviewed: Recent Labs    08/13/21 1336 08/13/21 1420 08/16/21 0421 08/26/21 0919 08/26/21 1058 08/27/21 0404 06/12/22 0630  NA  --    < > 141 141  --  142 141  K  --    < > 4.0 4.4  --  4.1 4.3  CL  --    < > 113* 111  --  109 104  CO2  --    < > 23 19*  --  26 33*  GLUCOSE  --    < > 84 82  --  91  --   BUN  --    < > 21 16  --  14 22*  CREATININE  --    < > 0.94 0.90  --  1.04* 1.0  CALCIUM  --    < > 8.1* 8.4*  --  8.4* 8.8  MG 2.3  --   --   --  1.4*  --   --    < > = values in this interval not displayed.   Recent Labs  08/13/21 1027 08/26/21 1058 06/12/22 0630  AST 37 27 23  ALT 25 12 15   ALKPHOS 80 56 76  BILITOT 1.0 1.3*  --   PROT 6.0* 3.9*  --   ALBUMIN 3.5 2.3* 3.7   Recent Labs    08/13/21 1027 08/13/21 1420 08/16/21 0421 08/26/21 0919 08/27/21 0404 06/12/22 0630  WBC 8.5  --  7.0 5.1 6.1 5.3  NEUTROABS 6.7  --  5.3  --   --  3,593.00  HGB 14.3   < > 12.2 13.7 13.8 14.9  HCT 46.1*   < > 38.0 43.9 42.6 45  MCV 97.7  --  95.7 97.6 94.5  --   PLT 264  --  176 168 162 221   < > = values in this interval not displayed.   Lab Results  Component Value Date   TSH 3.96 06/12/2022   Lab Results  Component Value Date   HGBA1C 5.9 (H) 11/04/2019   Lab Results  Component Value Date   CHOL 110 09/19/2021   HDL 55 09/19/2021   LDLCALC 41 09/19/2021   TRIG 56 09/19/2021   CHOLHDL 2.8 11/06/2019    Significant Diagnostic Results in last 30 days:  No results  found.  Assessment/Plan Hypotension Hold metoprolol if Bp<90/60  Paroxysmal SVT (supraventricular tachycardia) (HCC) off Amiodarone 01/2020 in hospital due to prolonged QT interval. On Metoprolol bid and prn, on Eliquis  Emphysema of lung (HCC) Stable.   GERD (gastroesophageal reflux disease) Stable,  takes Omeprazole. Hgb 13.8 08/27/21  Depression, recurrent (HCC)  stable, off Sertraline.   Senile dementia ([place]) since CVA, no behavioral issues. MMSE 15/30 03/20/21     Family/ staff Communication: plan of care reviewed with the patient and charge nurse.   Labs/tests ordered:  none  Time spend 35 minutes.

## 2022-06-25 NOTE — Assessment & Plan Note (Signed)
off Amiodarone 01/2020 in hospital due to prolonged QT interval. On Metoprolol bid and prn, on Eliquis

## 2022-06-25 NOTE — Assessment & Plan Note (Signed)
Stable

## 2022-06-25 NOTE — Assessment & Plan Note (Signed)
aches, ambulates with walker.

## 2022-06-25 NOTE — Assessment & Plan Note (Signed)
Stable, takes Omeprazole. Hgb 13.8 08/27/21 

## 2022-06-25 NOTE — Assessment & Plan Note (Signed)
stable, off Sertraline.  

## 2022-06-25 NOTE — Assessment & Plan Note (Signed)
Hold metoprolol if Bp<90/60

## 2022-06-26 ENCOUNTER — Encounter: Payer: Self-pay | Admitting: Family Medicine

## 2022-06-26 ENCOUNTER — Non-Acute Institutional Stay: Payer: Medicare Other | Admitting: Family Medicine

## 2022-06-26 VITALS — BP 104/72 | HR 48 | Resp 16 | Wt 101.2 lb

## 2022-06-26 DIAGNOSIS — E441 Mild protein-calorie malnutrition: Secondary | ICD-10-CM

## 2022-06-26 DIAGNOSIS — I4892 Unspecified atrial flutter: Secondary | ICD-10-CM | POA: Diagnosis not present

## 2022-06-26 DIAGNOSIS — F0390 Unspecified dementia without behavioral disturbance: Secondary | ICD-10-CM | POA: Diagnosis not present

## 2022-06-26 NOTE — Progress Notes (Signed)
[facility] Consult Note Telephone: [phone]  Fax: [phone]   Date of encounter: 06/26/22 10:34 AM PATIENT NAME: [name] [address] [place] [address]   [phone] (home)  DOB: September 11, 1922 MRN: [id number] PRIMARY CARE PROVIDER:    Virgie Dad, MD,  [place] [id number]  REFERRING PROVIDER:   Virgie Dad, MD [address] [place],  [place] [phone]  Health Care Agent/Health Care Power of Attorney:    Contact Information     Name Relation Home Work Mobile   [place]   [phone]   Gweneth Dimitri   [phone]   Ambrose Pancoast   [phone]        I met face to face with patient in [place] facility. Spoke to [place] by phone and provided patient update. Palliative Care was asked to follow this patient by consultation request of Virgie Dad, MD to address advance care planning and complex medical decision making. This is a follow up visit.   CODE STATUS: DNR and MOST   MOST as of 09/06/21: DNR/DNI with comfort measures Use of antibiotics and IV fluids on a case by case, time limited basis No feeding tube   ASSESSMENT AND / RECOMMENDATIONS:  PPS: 40 %   Senile Dementia -   FAST 6e    Worsening mentation and recall.  Reorient as necessary and monitor for changes in behaviors/mentation.                Paroxysmal Atrial Flutter-  Ectopic beats present. Rate on Pulse Ox 48 BPM but  higher with apical pulse count and ranging 50's-60's.   Continue Toprol 12.5 mg po BID for rate greater than 50 BPM. Hold for BP less than 90/60 mmHG.     Mild Protein Cal Malnutrition -   Albumin improved to 3.7 from previous of 2.3.  Fluctuating weights 1-2 lbs over the past month likely attributed more to muscle wasting from under use as patient increasingly noted to be laying in  bed.  Monitor monthly weights and encourage meals/snacks routinely.    Follow up Palliative Care Visit:  Palliative Care continuing to follow up by monitoring for changes in appetite, weight, functional and cognitive status for chronic disease progression and management in agreement with patient's stated goals of care. Next visit in 4 weeks or prn.  This visit was coded based on medical decision making (MDM).  Chief Complaint  Palliative Care is continuing to follow up patient for chronic medical  management of symptoms in setting of dementia.  HISTORY OF PRESENT ILLNESS: Bianca [name] is a 89 y.o. year old female with dementia.  Recent mild cough, tested negative for Covid.  CXR 06/08/22 done at outside facility negative for acute pulmonary findings, noted mild cardiomegaly. Gave 1 week course of DuoNeb.  Pt knows her first name, cannot give her last name/spouse's or mother's name but says she wants to see her mother.   ACTIVITIES OF DAILY LIVING: CONTINENT OF BLADDER? Yes CONTINENT OF BOWEL? No BATHING/DRESSING/FEEDING? Set up with feeding, total with bathing and dressing  MOBILITY:   INDEPENDENTLY AMBULATORY?  Yes   AMBULATORY WITH ASSISTIVE DEVICE: Rollator   APPETITE? 50-75% of meals Weight 06/08/22 101.4 lbs, Jan 104.0, Dec 23 102.8  CURRENT PROBLEM LIST:  Patient Active Problem List   Diagnosis Date Noted   Osteoarthritis, multiple sites 04/27/2022   Hammer toes, bilateral  01/01/2022   Onychomycosis 01/01/2022   Pain of upper abdomen 09/25/2021   Chest pain 08/26/2021   Junctional tachycardia 08/13/2021   Chest wall pain 01/19/2021   Left knee pain 01/11/2021   Weight gain 07/06/2020   Anemia, iron deficiency 01/22/2020   Syncope    Prolonged QT interval    Paroxysmal atrial flutter ([place])    Long term current use of anticoagulant    Mixed hyperlipidemia    History of stroke    Diastolic dysfunction    Fall 01/09/2020   Depression, recurrent ([place]) 11/07/2019    Stroke ([place]) 11/05/2019   Tachycardia 11/03/2019   Hypotension 11/03/2019   Sinus bradycardia 11/02/2019   Acute lower UTI 11/02/2019   Senile dementia ([place]) 10/26/2019   CKD (chronic kidney disease) stage 3, GFR 30-59 ml/min (HCC) 09/30/2019   Edema 06/11/2019   Metatarsalgia 06/11/2019   GERD (gastroesophageal reflux disease) 05/14/2019   Paroxysmal SVT (supraventricular tachycardia) 04/27/2019   Pneumonia due to COVID-19 virus 04/27/2019   Chronic diarrhea 04/27/2019   Urinary frequency 04/27/2019   Protein-calorie malnutrition ([place]) 04/27/2019   Emphysema of lung ([place]) 04/27/2019   PAST MEDICAL HISTORY:  Active Ambulatory Problems    Diagnosis Date Noted   Paroxysmal SVT (supraventricular tachycardia) 04/27/2019   Pneumonia due to COVID-19 virus 04/27/2019   Chronic diarrhea 04/27/2019   Urinary frequency 04/27/2019   Protein-calorie malnutrition ([place]) 04/27/2019   Emphysema of lung ([place]) 04/27/2019   GERD (gastroesophageal reflux disease) 05/14/2019   Edema 06/11/2019   Metatarsalgia 06/11/2019   CKD (chronic kidney disease) stage 3, GFR 30-59 ml/min (HCC) 09/30/2019   Senile dementia ([place]) 10/26/2019   Sinus bradycardia 11/02/2019   Acute lower UTI 11/02/2019   Tachycardia 11/03/2019   Hypotension 11/03/2019   Stroke ([place]) 11/05/2019   Depression, recurrent ([place]) 11/07/2019   Fall 01/09/2020   Prolonged QT interval    Paroxysmal atrial flutter ([place])    Long term current use of anticoagulant    Mixed hyperlipidemia    History of stroke    Diastolic dysfunction    Syncope    Anemia, iron deficiency 01/22/2020   Weight gain 07/06/2020   Left knee pain 01/11/2021   Chest wall pain 01/19/2021   Junctional tachycardia 08/13/2021   Chest pain 08/26/2021   Pain of upper abdomen 09/25/2021   Hammer toes, bilateral 01/01/2022   Onychomycosis 01/01/2022   Osteoarthritis, multiple sites 04/27/2022   Resolved Ambulatory Problems    Diagnosis Date Noted   Skin tear  of right lower leg without complication [id number]   Infected wound 07/27/2019   Altered mental status 11/02/2019   Cellulitis of right anterior lower leg 01/26/2021   Delayed wound healing 02/17/2021   Soreness of tongue 08/03/2021   Past Medical History:  Diagnosis Date   Atrial flutter, paroxysmal ([place])    COVID-19    Dementia (HCC)    Hyperlipidemia    Hypertension    PSVT (paroxysmal supraventricular tachycardia)    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 ([place])    SOCIAL HX:  Social History   Tobacco Use   Smoking status: Never   Smokeless tobacco: Never  Substance Use Topics   Alcohol use: Never   FAMILY HX: No family history on file.     Preferred Pharmacy: ALLERGIES:  Allergies  Allergen Reactions   Alphagan P [Brimonidine Tartrate] Other (See Comments)    Unknown reaction Listed on MAR   Azopt [Brinzolamide] Other (See Comments)  Unknown reaction Listed on MAR   Lumigan [Bimatoprost] Other (See Comments)    Unknown reaction Listed on MAR     PERTINENT MEDICATIONS:  Outpatient Encounter Medications as of 06/26/2022  Medication Sig   AMBULATORY NON FORMULARY MEDICATION DUKES MAGIC MOUTHWASH Give 5 mg/ml orally every 6 hours as needed for mouth care SWISH AND SPIT 4X DAILY BY MOUTH   apixaban (ELIQUIS) 2.5 MG TABS tablet Take 1 tablet (2.5 mg total) by mouth 2 (two) times daily.   atorvastatin (LIPITOR) 10 MG tablet Take 5 mg by mouth daily.   B Complex-Folic Acid (B COMPLEX VITAMINS, W/ FA, PO) Take 1 tablet by mouth daily. Vitamin B complex - Folic Acid 0.4 mg   bismuth subsalicylate (PEPTO BISMOL) 262 MG/15ML suspension Take 60 mLs by mouth daily as needed for indigestion or diarrhea or loose stools.    Calcium Carbonate-Vitamin D 600-400 MG-UNIT tablet Take 1 tablet by mouth daily.   melatonin 3 MG TABS tablet Take 3 mg by mouth at bedtime.   metoprolol tartrate (LOPRESSOR) 25 MG tablet Take 12.5 mg by  mouth as needed. Give 0.5 tablet orally two times a day for SVT HOLD IF HR < 50   MULTIPLE VITAMIN-FOLIC ACID PO Take 1 tablet by mouth daily.   omeprazole (PRILOSEC) 20 MG capsule Take 20 mg by mouth daily.    Sodium Fluoride (PREVIDENT 5000 BOOSTER PLUS) 1.1 % PSTE Place 1 application. onto teeth at bedtime.   timolol (TIMOPTIC) 0.5 % ophthalmic solution Place 1 drop into both eyes daily.   No facility-administered encounter medications on file as of 06/26/2022.    History obtained from review of EMR, discussion with primary team, and interview with family, facility staff/caregiver and/or patient.   CBC    Component Value Date/Time   WBC 5.3 06/12/2022 0630   WBC 6.1 08/27/2021 0404   RBC 4.82 06/12/2022 0630   HGB 14.9 06/12/2022 0630   HCT 45 06/12/2022 0630   PLT 221 06/12/2022 0630   MCV 94.5 08/27/2021 0404   MCH 30.6 08/27/2021 0404   MCHC 32.4 08/27/2021 0404   RDW 16.6 (H) 08/27/2021 0404   LYMPHSABS 0.8 08/16/2021 0421   MONOABS 0.6 08/16/2021 0421   EOSABS 0.2 08/16/2021 0421   BASOSABS 0.0 08/16/2021 0421    CMP    Latest Ref Rng & Units 06/12/2022    6:30 AM 08/27/2021    4:04 AM 08/26/2021   10:58 AM  CMP  Glucose 70 - 99 mg/dL  91    BUN 4 - 21 22     14     Creatinine 0.5 - 1.1 1.0     1.04    Sodium 137 - 147 141     142    Potassium 3.5 - 5.1 mEq/L 4.3     4.1    Chloride 99 - 108 104     109    CO2 13 - 22 33     26    Calcium 8.7 - 10.7 8.8     8.4    Total Protein 6.5 - 8.1 g/dL   3.9   Total Bilirubin 0.3 - 1.2 mg/dL   1.3   Alkaline Phos 25 - 125 76      56   AST 13 - 35 23      27   ALT 7 - 35 U/L 15      12      This result is from an external source.  LFTs    Latest Ref Rng & Units 06/12/2022    6:30 AM 08/26/2021   10:58 AM 08/13/2021   10:27 AM  Hepatic Function  Total Protein 6.5 - 8.1 g/dL  3.9  6.0   Albumin 3.5 - 5.0 3.7     2.3  3.5   AST 13 - 35 23     27  37   ALT 7 - 35 U/L 15     12  25    Alk Phosphatase 25 - 125 76     56   80   Total Bilirubin 0.3 - 1.2 mg/dL  1.3  1.0   Bilirubin, Direct 0.0 - 0.2 mg/dL  0.3       This result is from an external source.       I reviewed available labs, medications, imaging, studies and related documents from the EMR.  There were no new records/imaging since last visit. Records reviewed and summarized above.   Physical Exam: GENERAL: NAD LUNGS: CTAB, no increased work of breathing, room air CARDIAC:  Irregular rhythm, No M/R/G, No edema, No cyanosis ABD:  Normo-active BS x 4 quads, soft, non-tender EXTREMITIES: Normal ROM, no deformity, strength equal, Yes muscle atrophy/subcutaneous fat loss to BLE  NEURO:  Cognitive Impairment, forgetful but able to follow simple commands  PSYCH:  non-anxious affect, A & O to self, Decreased recall, responds appropriately to conversation although conversation wanders   Thank you for the opportunity to participate in the care of [place]. Please call our main office at [phone] if we can be of additional assistance.    Damaris Hippo FNP-C  [email or web address] .[facility]  Phone:  [phone]

## 2022-06-27 DIAGNOSIS — R1312 Dysphagia, oropharyngeal phase: Secondary | ICD-10-CM | POA: Diagnosis not present

## 2022-06-29 DIAGNOSIS — R1312 Dysphagia, oropharyngeal phase: Secondary | ICD-10-CM | POA: Diagnosis not present

## 2022-07-03 DIAGNOSIS — R1312 Dysphagia, oropharyngeal phase: Secondary | ICD-10-CM | POA: Diagnosis not present

## 2022-07-04 DIAGNOSIS — R1312 Dysphagia, oropharyngeal phase: Secondary | ICD-10-CM | POA: Diagnosis not present

## 2022-07-06 DIAGNOSIS — R1312 Dysphagia, oropharyngeal phase: Secondary | ICD-10-CM | POA: Diagnosis not present

## 2022-07-09 DIAGNOSIS — R1312 Dysphagia, oropharyngeal phase: Secondary | ICD-10-CM | POA: Diagnosis not present

## 2022-07-11 DIAGNOSIS — R1312 Dysphagia, oropharyngeal phase: Secondary | ICD-10-CM | POA: Diagnosis not present

## 2022-07-12 DIAGNOSIS — R1312 Dysphagia, oropharyngeal phase: Secondary | ICD-10-CM | POA: Diagnosis not present

## 2022-07-13 DIAGNOSIS — R1312 Dysphagia, oropharyngeal phase: Secondary | ICD-10-CM | POA: Diagnosis not present

## 2022-07-17 ENCOUNTER — Encounter: Payer: Self-pay | Admitting: Nurse Practitioner

## 2022-07-17 ENCOUNTER — Non-Acute Institutional Stay (SKILLED_NURSING_FACILITY): Payer: Medicare Other | Admitting: Nurse Practitioner

## 2022-07-17 DIAGNOSIS — N1831 Chronic kidney disease, stage 3a: Secondary | ICD-10-CM | POA: Diagnosis not present

## 2022-07-17 DIAGNOSIS — F339 Major depressive disorder, recurrent, unspecified: Secondary | ICD-10-CM | POA: Diagnosis not present

## 2022-07-17 DIAGNOSIS — I639 Cerebral infarction, unspecified: Secondary | ICD-10-CM | POA: Diagnosis not present

## 2022-07-17 DIAGNOSIS — I471 Supraventricular tachycardia, unspecified: Secondary | ICD-10-CM | POA: Diagnosis not present

## 2022-07-17 DIAGNOSIS — D509 Iron deficiency anemia, unspecified: Secondary | ICD-10-CM | POA: Diagnosis not present

## 2022-07-17 DIAGNOSIS — M159 Polyosteoarthritis, unspecified: Secondary | ICD-10-CM | POA: Diagnosis not present

## 2022-07-17 DIAGNOSIS — E782 Mixed hyperlipidemia: Secondary | ICD-10-CM

## 2022-07-17 DIAGNOSIS — F0390 Unspecified dementia without behavioral disturbance: Secondary | ICD-10-CM | POA: Diagnosis not present

## 2022-07-17 DIAGNOSIS — K219 Gastro-esophageal reflux disease without esophagitis: Secondary | ICD-10-CM | POA: Diagnosis not present

## 2022-07-17 NOTE — Assessment & Plan Note (Signed)
off Amiodarone 01/2020 in hospital due to prolonged QT interval. On Metoprolol bid and prn, on Eliquis 

## 2022-07-17 NOTE — Assessment & Plan Note (Signed)
stable, off Sertraline.  

## 2022-07-17 NOTE — Assessment & Plan Note (Signed)
aches, ambulates with walker.  

## 2022-07-17 NOTE — Progress Notes (Unsigned)
Location:   [facility] Room Number: 40A Place of Service:  SNF [phone]) Provider:  Ivadell Gaul Link SnufferXie, NP  Mahlon GammonGupta, Anjali L, MD  Patient Care Team: Mahlon GammonGupta, Anjali L, MD as PCP - General (Internal Medicine)  Extended Emergency Contact Information Primary Emergency Contact: [name],[name] Mobile Phone: [phone] Relation: Nephew Secondary Emergency Contact: [name],[name] Mobile Phone: [phone] Relation: Friend  Code Status:  DNR Goals of care: Advanced Directive information    07/17/2022    9:49 AM  Advanced Directives  Does Patient Have a Medical Advance Directive? Yes  Type of Estate agentAdvance Directive Healthcare Power of ClearviewAttorney;Living will;Out of facility DNR (pink MOST or yellow form)  Does patient want to make changes to medical advance directive? No - Patient declined  Copy of Healthcare Power of Attorney in Chart? Yes - validated most recent copy scanned in chart (See row information)  Pre-existing out of facility DNR order (yellow form or pink MOST form) Pink MOST form placed in chart (order not valid for inpatient use);Yellow form placed in chart (order not valid for inpatient use)     Chief Complaint  Patient presents with   Medical Management of Chronic Issues    Routine follow up visit   Immunizations    Tdap and COVID booster due   Quality Metric Gaps    Annual wellness visit due    HPI:  Pt is a 89 y.o. female seen today for medical management of chronic diseases.      Hx of emphysema, stable.  Edema, not apparent, off Torsemide. BNP 798 08/26/21, echo 08/27/21 EF 60-65%             PSVT off Amiodarone 01/2020 in hospital due to prolonged QT interval. On Metoprolol bid and prn, on Eliquis             GERD, takes Omeprazole. Hgb 14.9 06/12/22             Depression, stable, off Sertraline.              CKD Bun/creat 22/1.0 06/12/22             Senile dementia since CVA, no behavioral issues. MMSE 15/30 03/20/21             11/02/19 CVA (MRI cevebral  small acute infarcts in the posterior left hemisphere in the left PCA and MCA watershed area. Takes Eliquis.              Anemia, 01/21/20 Iron 44, ferritin 63, Vit B12 695, Hgb 14.9 06/12/22             Hyperlipidemia, takes Atorvastatin, LDL 41 09/19/21             OA lower back, aches, ambulates with walker.                   Past Medical History:  Diagnosis Date   Atrial flutter, paroxysmal    COVID-19    Dementia    Diastolic dysfunction    Hyperlipidemia    Hypertension    PSVT (paroxysmal supraventricular tachycardia)    Stroke    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    History reviewed. No pertinent surgical history.  Allergies  Allergen Reactions   Alphagan P [Brimonidine Tartrate] Other (See Comments)    Unknown reaction Listed on MAR   Azopt [Brinzolamide] Other (See Comments)    Unknown reaction Listed on MAR   Lumigan [Bimatoprost] Other (  See Comments)    Unknown reaction Listed on MAR    Allergies as of 07/17/2022       Reactions   Alphagan P [brimonidine Tartrate] Other (See Comments)   Unknown reaction Listed on MAR   Azopt [brinzolamide] Other (See Comments)   Unknown reaction Listed on MAR   Lumigan [bimatoprost] Other (See Comments)   Unknown reaction Listed on [facility]        Medication List        Accurate as of July 17, 2022  1:18 PM. If you have any questions, ask your nurse or doctor.          STOP taking these medications    AMBULATORY NON FORMULARY MEDICATION Stopped by: Yafet Cline X Duwan Adrian, NP   bismuth subsalicylate 262 MG/15ML suspension Commonly known as: PEPTO BISMOL Stopped by: Louis Gaw X Trinka Keshishyan, NP       TAKE these medications    apixaban 2.5 MG Tabs tablet Commonly known as: ELIQUIS Take 1 tablet (2.5 mg total) by mouth 2 (two) times daily.   atorvastatin 10 MG tablet Commonly known as: LIPITOR Take 5 mg by mouth daily.   B COMPLEX VITAMINS (W/ FA) PO Take 1 tablet by  mouth daily. Vitamin B complex - Folic Acid 0.4 mg   Calcium Carbonate-Vitamin D 600-400 MG-UNIT tablet Take 1 tablet by mouth daily.   melatonin 3 MG Tabs tablet Take 3 mg by mouth at bedtime.   metoprolol tartrate 25 MG tablet Commonly known as: LOPRESSOR Take 12.5 mg by mouth as needed. Give 0.5 tablet orally two times a day for SVT HOLD IF HR < 50   MULTIPLE VITAMIN-FOLIC ACID PO Take 1 tablet by mouth daily.   omeprazole 20 MG capsule Commonly known as: PRILOSEC Take 20 mg by mouth daily.   PreviDent 5000 Booster Plus 1.1 % Pste Generic drug: Sodium Fluoride Place 1 application. onto teeth at bedtime.   timolol 0.5 % ophthalmic solution Commonly known as: TIMOPTIC Place 1 drop into both eyes daily.        Review of Systems  Constitutional:  Negative for activity change and fever.  HENT:  Positive for hearing loss. Negative for congestion and trouble swallowing.   Eyes:  Negative for visual disturbance.  Respiratory:  Positive for cough. Negative for shortness of breath and wheezing.        Occasionally hacking cough  Cardiovascular:  Negative for leg swelling.  Gastrointestinal:  Negative for abdominal pain and constipation.  Genitourinary:  Positive for frequency. Negative for dysuria and urgency.  Musculoskeletal:  Positive for arthralgias, back pain and gait problem.  Skin:  Negative for color change.  Neurological:  Negative for speech difficulty, weakness and light-headedness.       Memory lapses.   Psychiatric/Behavioral:  Positive for confusion. Negative for sleep disturbance. The patient is not nervous/anxious.        Baseline mentation.     Immunization History  Administered Date(s) Administered   DTaP 09/05/2004   Influenza, High Dose Seasonal PF 01/06/2014, 11/08/2018   Influenza, Quadrivalent, Recombinant, Inj, Pf 01/09/2018, 01/21/2019   Influenza-Unspecified 11/07/2013, 01/14/2020, 01/26/2021, 01/31/2022   Moderna Covid-19 Vaccine Bivalent  Booster 33yrs & up 04/04/2021   Moderna Sars-Covid-2 Vaccination 05/09/2019, 06/06/2019, 02/22/2020, 09/06/2020   Pneumococcal Conjugate-13 01/06/2014   Pneumococcal Polysaccharide-23 09/05/2004   Pneumococcal-Unspecified 09/05/2004, 10/18/2014   Zoster Recombinat (Shingrix) 03/21/2021, 05/24/2021   Pertinent  Health Maintenance Due  Topic Date Due   INFLUENZA VACCINE  11/08/2022   DEXA SCAN  Completed      08/26/2021    2:00 PM 08/26/2021    8:30 PM 08/27/2021   10:00 AM 01/09/2022   12:36 PM 06/25/2022   12:28 PM  Fall Risk  Falls in the past year?    0 0  Was there an injury with Fall?    0 0  Fall Risk Category Calculator    0 0  Fall Risk Category (Retired)    Low   (RETIRED) Patient Fall Risk Level High fall risk High fall risk High fall risk Low fall risk   Patient at Risk for Falls Due to    No Fall Risks No Fall Risks  Fall risk Follow up    Falls evaluation completed Falls evaluation completed   Functional Status Survey:    There were no vitals filed for this visit. There is no height or weight on file to calculate BMI. Physical Exam Vitals and nursing note reviewed.  Constitutional:      Appearance: Normal appearance.  HENT:     Head: Normocephalic and atraumatic.     Nose: Nose normal.     Mouth/Throat:     Mouth: Mucous membranes are moist.  Eyes:     Extraocular Movements: Extraocular movements intact.     Conjunctiva/sclera: Conjunctivae normal.     Pupils: Pupils are equal, round, and reactive to light.  Cardiovascular:     Rate and Rhythm: Normal rate and regular rhythm.     Heart sounds: No murmur heard. Pulmonary:     Effort: Pulmonary effort is normal.     Breath sounds: No rales.  Abdominal:     General: Bowel sounds are normal.     Palpations: Abdomen is soft.     Tenderness: There is no abdominal tenderness.  Musculoskeletal:     Cervical back: Normal range of motion and neck supple.     Right lower leg: No edema.     Left lower leg: No  edema.  Skin:    General: Skin is warm and dry.     Comments: Hammer toes most of her toes Yellow, thick toe nails.   Neurological:     General: No focal deficit present.     Mental Status: She is alert and oriented to person, place, and time. Mental status is at baseline.     Gait: Gait abnormal.     Comments: Ambulates with walker.   Psychiatric:        Mood and Affect: Mood normal.        Behavior: Behavior normal.        Thought Content: Thought content normal.     Labs reviewed: Recent Labs    08/13/21 1336 08/13/21 1420 08/16/21 0421 08/26/21 0919 08/26/21 1058 08/27/21 0404 06/12/22 0630  NA  --    < > 141 141  --  142 141  K  --    < > 4.0 4.4  --  4.1 4.3  CL  --    < > 113* 111  --  109 104  CO2  --    < > 23 19*  --  26 33*  GLUCOSE  --    < > 84 82  --  91  --   BUN  --    < > 21 16  --  14 22*  CREATININE  --    < > 0.94 0.90  --  1.04* 1.0  CALCIUM  --    < > 8.1* 8.4*  --  8.4* 8.8  MG 2.3  --   --   --  1.4*  --   --    < > = values in this interval not displayed.   Recent Labs    08/13/21 1027 08/26/21 1058 06/12/22 0630  AST 37 27 23  ALT 25 12 15   ALKPHOS 80 56 76  BILITOT 1.0 1.3*  --   PROT 6.0* 3.9*  --   ALBUMIN 3.5 2.3* 3.7   Recent Labs    08/13/21 1027 08/13/21 1420 08/16/21 0421 08/26/21 0919 08/27/21 0404 06/12/22 0630  WBC 8.5  --  7.0 5.1 6.1 5.3  NEUTROABS 6.7  --  5.3  --   --  3,593.00  HGB 14.3   < > 12.2 13.7 13.8 14.9  HCT 46.1*   < > 38.0 43.9 42.6 45  MCV 97.7  --  95.7 97.6 94.5  --   PLT 264  --  176 168 162 221   < > = values in this interval not displayed.   Lab Results  Component Value Date   TSH 3.96 06/12/2022   Lab Results  Component Value Date   HGBA1C 5.9 (H) 11/04/2019   Lab Results  Component Value Date   CHOL 110 09/19/2021   HDL 55 09/19/2021   LDLCALC 41 09/19/2021   TRIG 56 09/19/2021   CHOLHDL 2.8 11/06/2019    Significant Diagnostic Results in last 30 days:  No results  found.  Assessment/Plan CKD (chronic kidney disease) stage 3, GFR 30-59 ml/min (HCC) Bun/creat 22/1.0 06/12/22  Senile dementia (HCC)  Senile dementia since CVA, no behavioral issues. MMSE 15/30 03/20/21  Stroke (HCC)   11/02/19 CVA (MRI cevebral small acute infarcts in the posterior left hemisphere in the left PCA and MCA watershed area. Takes Eliquis.   Anemia, iron deficiency 01/21/20 Iron 44, ferritin 63, Vit B12 695, Hgb 14.9 06/12/22  Mixed hyperlipidemia takes Atorvastatin, LDL 41 09/19/21  Osteoarthritis, multiple sites aches, ambulates with walker.   Depression, recurrent (HCC) stable, off Sertraline.   GERD (gastroesophageal reflux disease) Stable, takes Omeprazole. Hgb 14.9 06/12/22  Paroxysmal SVT (supraventricular tachycardia) (HCC)  off Amiodarone 01/2020 in hospital due to prolonged QT interval. On Metoprolol bid and prn, on Eliquis     Family/ staff Communication: plan of care reviewed with the patient and charge nurse.   Labs/tests ordered:  none  Time spend 35 minutes.

## 2022-07-17 NOTE — Assessment & Plan Note (Signed)
Occasionally hacking cough.

## 2022-07-17 NOTE — Assessment & Plan Note (Signed)
takes Atorvastatin, LDL 41 09/19/21   

## 2022-07-17 NOTE — Assessment & Plan Note (Signed)
Stable, takes Omeprazole. Hgb 14.9 06/12/22

## 2022-07-17 NOTE — Assessment & Plan Note (Signed)
01/21/20 Iron 44, ferritin 63, Vit B12 695, Hgb 14.9 06/12/22

## 2022-07-17 NOTE — Assessment & Plan Note (Signed)
Bun/creat 22/1.0 06/12/22

## 2022-07-17 NOTE — Assessment & Plan Note (Signed)
Senile dementia since CVA, no behavioral issues. MMSE 15/30 03/20/21 

## 2022-07-17 NOTE — Assessment & Plan Note (Signed)
11/02/19 CVA (MRI cevebral small acute infarcts in the posterior left hemisphere in the left PCA and MCA watershed area. Takes Eliquis.  

## 2022-07-19 ENCOUNTER — Encounter: Payer: Self-pay | Admitting: Nurse Practitioner

## 2022-07-19 ENCOUNTER — Non-Acute Institutional Stay (INDEPENDENT_AMBULATORY_CARE_PROVIDER_SITE_OTHER): Payer: Medicare Other | Admitting: Nurse Practitioner

## 2022-07-19 DIAGNOSIS — Z0000 Encounter for general adult medical examination without abnormal findings: Secondary | ICD-10-CM

## 2022-07-19 NOTE — Progress Notes (Signed)
Subjective:   Bianca [name] is a 89 y.o. female who presents for Medicare Annual (Subsequent) preventive examination @ [facility] [address].  Cardiac Risk Factors include: advanced age (>75men, >71 women);dyslipidemia     Objective:    Today's Vitals   07/19/22 1033  BP: 130/72  Pulse: (!) 50  Resp: 18  Temp: (!) 97.2 F (36.2 C)  SpO2: 92%  Weight: 102 lb (46.3 kg)  Height: 4\' 10"  (1.473 m)   Body mass index is 21.32 kg/m.     07/19/2022   10:41 AM 07/17/2022    9:49 AM 06/25/2022   12:28 PM 04/27/2022   11:30 AM 01/09/2022   12:36 PM 01/01/2022    4:28 PM 12/08/2021    9:26 AM  Advanced Directives  Does Patient Have a Medical Advance Directive? Yes Yes Yes Yes Yes Yes Yes  Type of Estate agent of [place];Living will;Out of facility DNR (pink MOST or yellow form) Healthcare Power of [place];Living will;Out of facility DNR (pink MOST or yellow form) Healthcare Power of [place];Living will;Out of facility DNR (pink MOST or yellow form) Healthcare Power of [place];Living will;Out of facility DNR (pink MOST or yellow form) Healthcare Power of [place];Living will;Out of facility DNR (pink MOST or yellow form) Healthcare Power of [place];Living will;Out of facility DNR (pink MOST or yellow form) Healthcare Power of [place];Living will;Out of facility DNR (pink MOST or yellow form)  Does patient want to make changes to medical advance directive? No - Patient declined No - Patient declined No - Patient declined No - Patient declined No - Patient declined No - Patient declined No - Patient declined  Copy of Healthcare Power of Attorney in Chart? Yes - validated most recent copy scanned in chart (See row information) Yes - validated most recent copy scanned in chart (See row information) Yes - validated most recent copy scanned in chart (See row information) Yes - validated most recent copy scanned in chart (See row information) Yes - validated most recent  copy scanned in chart (See row information) Yes - validated most recent copy scanned in chart (See row information) Yes - validated most recent copy scanned in chart (See row information)  Pre-existing out of facility DNR order (yellow form or pink MOST form) Pink MOST form placed in chart (order not valid for inpatient use);Yellow form placed in chart (order not valid for inpatient use) Pink MOST form placed in chart (order not valid for inpatient use);Yellow form placed in chart (order not valid for inpatient use) Pink MOST/Yellow Form most recent copy in chart - Physician notified to receive inpatient order Pink MOST/Yellow Form most recent copy in chart - Physician notified to receive inpatient order Yellow form placed in chart (order not valid for inpatient use);Pink MOST form placed in chart (order not valid for inpatient use) Yellow form placed in chart (order not valid for inpatient use);Pink MOST form placed in chart (order not valid for inpatient use) Pink MOST form placed in chart (order not valid for inpatient use);Yellow form placed in chart (order not valid for inpatient use)    Current Medications (verified) Outpatient Encounter Medications as of 07/19/2022  Medication Sig   apixaban (ELIQUIS) 2.5 MG TABS tablet Take 1 tablet (2.5 mg total) by mouth 2 (two) times daily.   atorvastatin (LIPITOR) 10 MG tablet Take 5 mg by mouth daily.   B Complex-Folic Acid (B COMPLEX VITAMINS, W/ FA, PO) Take 1 tablet by mouth daily. Vitamin B complex -  Folic Acid 0.4 mg   Calcium Carbonate-Vitamin D 600-400 MG-UNIT tablet Take 1 tablet by mouth daily.   melatonin 3 MG TABS tablet Take 3 mg by mouth at bedtime.   metoprolol tartrate (LOPRESSOR) 25 MG tablet Take 12.5 mg by mouth as needed. Give 0.5 tablet orally two times a day for SVT HOLD IF HR < 50   MULTIPLE VITAMIN-FOLIC ACID PO Take 1 tablet by mouth daily.   omeprazole (PRILOSEC) 20 MG capsule Take 20 mg by mouth daily.    Sodium Fluoride  (PREVIDENT 5000 BOOSTER PLUS) 1.1 % PSTE Place 1 application. onto teeth at bedtime.   timolol (TIMOPTIC) 0.5 % ophthalmic solution Place 1 drop into both eyes daily.   No facility-administered encounter medications on file as of 07/19/2022.    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 No pertinent surgical history. History reviewed. No pertinent family history. Social History   Socioeconomic History   Marital status: Widowed    Spouse name: Not on file   Number of children: 0   Years of education: Not on file   Highest education level: Not on file  Occupational History   Not on file  Tobacco Use   Smoking status: Never   Smokeless tobacco: Never  Vaping Use   Vaping Use: Never used  Substance and Sexual Activity   Alcohol use: Never   Drug use: Never   Sexual activity: Not on file  Other Topics Concern   Not on file  Social History Narrative   Not on file   Social Determinants of Health   Financial Resource Strain: Not on file  Food Insecurity: No Food Insecurity (08/16/2021)   Hunger Vital Sign    Worried About Running Out of Food in the Last Year: Never true    Ran Out of Food in the Last Year: Never true  Transportation Needs: Not on file  Physical Activity: Not on file  Stress: Not on file  Social Connections: Not on file    Tobacco Counseling Counseling given: Not Answered   Clinical Intake:     Pain : No/denies pain     BMI - recorded: 21.32 Nutritional Status: BMI of 19-24  Normal Nutritional Risks: None Diabetes: No  How often do you need to  have someone help you when you read instructions, pamphlets, or other written materials from your doctor or pharmacy?: 5 - Always  Diabetic?no  Interpreter Needed?: No  Information entered by :: Felise Georgia Nedra Hai NP   Activities of Daily Living    07/19/2022    1:12 PM 08/26/2021    2:00 PM  In your present state of health, do you have any difficulty performing the following activities:  Hearing? 0 0  Vision? 0 0  Difficulty concentrating or making decisions? 1 1  Walking or climbing stairs? 1 1  Dressing or bathing? 1 1  Doing errands, shopping? 1 1  Preparing Food and eating ? N   Using the Toilet? N   In the past six months, have you accidently leaked urine? Y   Do you have problems with  loss of bowel control? N   Managing your Medications? Y   Managing your Finances? Y   Housekeeping or managing your Housekeeping? Y     Patient Care Team: Mahlon Gammon, MD as PCP - General (Internal Medicine)  Indicate any recent Medical Services you may have received from other than [facility] providers in the past year (date may be approximate).     Assessment:   This is a routine wellness examination for [facility].  Hearing/Vision screen No results found.  Dietary issues and exercise activities discussed: Current Exercise Habits: The patient does not participate in regular exercise at present, Exercise limited by: cardiac condition(s);orthopedic condition(s)   Goals Addressed             This Visit's Progress    Maintain Mobility and Function       Evidence-based guidance:  Acknowledge and validate impact of pain, loss of strength and potential disfigurement (hand osteoarthritis) on mental health and daily life, such as social isolation, anxiety, depression, impaired sexual relationship and   injury from falls.  Anticipate referral to physical or occupational therapy for assessment, therapeutic exercise and recommendation for adaptive equipment or assistive devices; encourage  participation.  Assess impact on ability to perform activities of daily living, as well as engage in sports and leisure events or requirements of work or school.  Provide anticipatory guidance and reassurance about the benefit of exercise to maintain function; acknowledge and normalize fear that exercise may worsen symptoms.  Encourage regular exercise, at least 10 minutes at a time for 45 minutes per week; consider yoga, water exercise and proprioceptive exercises; encourage use of wearable activity tracker to increase motivation and adherence.  Encourage maintenance or resumption of daily activities, including employment, as pain allows and with minimal exposure to trauma.  Assist patient to advocate for adaptations to the work environment.  Consider level of pain and function, gender, age, lifestyle, patient preference, quality of life, readiness and ?ocapacity to benefit? when recommending patients for orthopaedic surgery consultation.  Explore strategies, such as changes to medication regimen or activity that enables patient to anticipate and manage flare-ups that increase deconditioning and disability.  Explore patient preferences; encourage exposure to a broader range of activities that have been avoided for fear of experiencing pain.  Identify barriers to participation in therapy or exercise, such as pain with activity, anticipated or imagined pain.  Monitor postoperative joint replacement or any preexisting joint replacement for ongoing pain and loss of function; provide social support and encouragement throughout recovery.   Notes:        Depression Screen    07/19/2022   10:41 AM 06/25/2022   12:28 PM  PHQ 2/9 Scores  PHQ - 2 Score 0 0    Fall Risk    07/19/2022   10:41 AM 06/25/2022   12:28 PM 01/09/2022   12:36 PM 05/19/2019    1:43 PM 05/14/2019    3:06 PM  Fall Risk   Falls in the past year? 0 0 0 0 0  Number falls in past yr: 0 0 0 0 0  Injury with Fall? 0 0 0 0   Risk  for fall due to : No Fall Risks No Fall Risks No Fall Risks    Follow up Falls evaluation completed Falls evaluation completed Falls evaluation completed      FALL RISK PREVENTION PERTAINING TO THE HOME:  Any stairs in or around the home? Yes  If so, are there any without handrails? No  Home free of loose throw rugs in walkways, pet beds, electrical cords, etc? Yes  Adequate lighting in your home to reduce risk of falls? Yes   ASSISTIVE DEVICES UTILIZED TO PREVENT FALLS:  Life alert? No  Use of a cane, walker or w/c? Yes  Grab bars in the bathroom? Yes  Shower chair or bench in shower? Yes   Elevated toilet seat or a handicapped toilet? Yes   TIMED UP AND GO:  Was the test performed? Yes .  Length of time to ambulate 10 feet: 12 sec.   Gait slow and steady with assistive device  Cognitive Function:    03/25/2021    9:37 PM 12/28/2019    9:00 AM  MMSE - Mini Mental State Exam  Orientation to time 1 2  Orientation to Place 1 4  Registration 3 3  Attention/ Calculation 1 1  Recall 1 0  Language- name 2 objects 1 2  Language- repeat 1 1  Language- follow 3 step command 3 3  Language- read & follow direction 1 1  Write a sentence 1 1  Copy design 1 0  Total score 15 18        Immunizations Immunization History  Administered Date(s) Administered   DTaP 09/05/2004   Influenza, High Dose Seasonal PF 01/06/2014, 11/08/2018   Influenza, Quadrivalent, Recombinant, Inj, Pf 01/09/2018, 01/21/2019   Influenza-Unspecified 11/07/2013, 01/14/2020, 01/26/2021, 01/31/2022   Moderna Covid-19 Vaccine Bivalent Booster 96yrs & up 04/04/2021   Moderna SARS-COV2 Booster Vaccination 02/08/2022   Moderna Sars-Covid-2 Vaccination 05/09/2019, 06/06/2019, 02/22/2020, 09/06/2020   Pneumococcal Conjugate-13 01/06/2014   Pneumococcal Polysaccharide-23 09/05/2004   Pneumococcal-Unspecified 09/05/2004, 10/18/2014   Zoster Recombinat (Shingrix) 03/21/2021, 05/24/2021    TDAP status: Due,  Education has been provided regarding the importance of this vaccine. Advised may receive this vaccine at local pharmacy or Health Dept. Aware to provide a copy of the vaccination record if obtained from local pharmacy or Health Dept. Verbalized acceptance and understanding.  Flu Vaccine status: Up to date  Pneumococcal vaccine status: Up to date  Covid-19 vaccine status: Completed vaccines  Qualifies for Shingles Vaccine? Yes   Zostavax completed Yes   Shingrix Completed?: Yes  Screening Tests Health Maintenance  Topic Date Due   DTaP/Tdap/Td (2 - Tdap) 09/06/2014   COVID-19 Vaccine (7 - 2023-24 season) 04/05/2022   INFLUENZA VACCINE  11/08/2022   Medicare Annual Wellness (AWV)  07/19/2023   Pneumonia Vaccine 44+ Years old  Completed   DEXA SCAN  Completed   Zoster Vaccines- Shingrix  Completed   HPV VACCINES  Aged Out    Health Maintenance  Health Maintenance Due  Topic Date Due   DTaP/Tdap/Td (2 - Tdap) 09/06/2014   COVID-19 Vaccine (7 - 2023-24 season) 04/05/2022    Colorectal cancer screening: No longer required.   Mammogram status: No longer required due to age.  Bone Density status: Completed 09/08/2002. Results reflect: Bone density results: OSTEOPENIA. Repeat every 2 years.  Lung Cancer Screening: (Low Dose CT Chest recommended if Age 8-80 years, 30 pack-year currently smoking OR have quit w/in 15years.) does not qualify.   Additional Screening:  Hepatitis C Screening: does not qualify;   Vision Screening: Recommended annual ophthalmology exams for early detection of glaucoma and other disorders of the eye. Is the patient up to date with their annual eye exam?  No  Who is the provider or what is the name of the office in which the patient attends annual eye exams? [facility] will provide.  If pt is not established with a provider, would they like to be referred to a provider to establish care? No .   Dental Screening: Recommended annual dental exams for proper oral  hygiene  Community Resource Referral / Chronic Care Management: CRR required this visit?  No   CCM required this visit?  No      Plan:     I have personally reviewed and noted the following in the patient's chart:   Medical and social history Use of alcohol, tobacco or illicit drugs  Current medications and supplements including opioid prescriptions. Patient is not currently taking opioid prescriptions. Functional ability and status Nutritional status Physical activity Advanced directives List of other physicians Hospitalizations, surgeries, and ER visits in previous 12 months Vitals Screenings to include cognitive, depression, and falls Referrals and appointments  In addition, I have reviewed and discussed with patient certain preventive protocols, quality metrics, and best practice recommendations. A written personalized care plan for preventive services as well as general preventive health recommendations were provided to patient.     Sherrelle Prochazka X Justice Aguirre, NP   07/19/2022

## 2022-08-03 DIAGNOSIS — F330 Major depressive disorder, recurrent, mild: Secondary | ICD-10-CM | POA: Diagnosis not present

## 2022-08-03 DIAGNOSIS — G4700 Insomnia, unspecified: Secondary | ICD-10-CM | POA: Diagnosis not present

## 2022-08-28 ENCOUNTER — Non-Acute Institutional Stay (SKILLED_NURSING_FACILITY): Payer: Medicare Other | Admitting: Family Medicine

## 2022-08-28 DIAGNOSIS — N1831 Chronic kidney disease, stage 3a: Secondary | ICD-10-CM | POA: Diagnosis not present

## 2022-08-28 DIAGNOSIS — J439 Emphysema, unspecified: Secondary | ICD-10-CM | POA: Diagnosis not present

## 2022-08-28 DIAGNOSIS — E782 Mixed hyperlipidemia: Secondary | ICD-10-CM

## 2022-08-28 DIAGNOSIS — Z8673 Personal history of transient ischemic attack (TIA), and cerebral infarction without residual deficits: Secondary | ICD-10-CM

## 2022-08-28 DIAGNOSIS — F0390 Unspecified dementia without behavioral disturbance: Secondary | ICD-10-CM | POA: Diagnosis not present

## 2022-08-28 NOTE — Progress Notes (Signed)
Provider:  Jacalyn Lefevre, MD Location:      Place of Service:     PCP: Mahlon Gammon, MD Patient Care Team: Mahlon Gammon, MD as PCP - General (Internal Medicine)  Extended Emergency Contact Information Primary Emergency Contact: [name],[name] Mobile Phone: [phone] Relation: Nephew Secondary Emergency Contact: [name],[name] Mobile Phone: [phone] Relation: Friend  Code Status:  Goals of Care: Advanced Directive information    07/19/2022   10:41 AM  Advanced Directives  Does Patient Have a Medical Advance Directive? Yes  Type of Estate agent of [place];Living will;Out of facility DNR (pink MOST or yellow form)  Does patient want to make changes to medical advance directive? No - Patient declined  Copy of Healthcare Power of Attorney in Chart? Yes - validated most recent copy scanned in chart (See row information)  Pre-existing out of facility DNR order (yellow form or pink MOST form) Pink MOST form placed in chart (order not valid for inpatient use);Yellow form placed in chart (order not valid for inpatient use)      No chief complaint on file.   HPI: Patient is a 89 y.o. female seen today for medical management of chronic diseases including osteoarthritis especially in the lower back, hyperlipidemia, senile dementia with MMSE of 15 over 31-1/2 years ago history of COPD.  I found her resting in her room today.  She answers questions for the most part although she does seem a little confused.  There have been no behavioral issues. She has a history of CVA in 2021 and takes Eliquis as a result.  She continues with atorvastatin for hyperlipidemia with LDL of 41 1 year ago.  She tells me she has had some falls.  She uses a walker for ambulation  Past Medical History:  Diagnosis Date   Atrial flutter, paroxysmal (HCC)    COVID-19    Dementia (HCC)    Diastolic dysfunction    Hyperlipidemia    Hypertension    PSVT (paroxysmal supraventricular  tachycardia)    Stroke (HCC)    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 (HCC)    No past surgical history on file.  reports that she has never smoked. She has never used smokeless tobacco. She reports that she does not drink alcohol and does not use drugs. Social History   Socioeconomic History   Marital status: Widowed    Spouse name: Not on file   Number of children: 0   Years of education: Not on file   Highest education level: Not on file  Occupational History   Not on file  Tobacco Use   Smoking status: Never   Smokeless tobacco: Never  Vaping Use   Vaping Use: Never used  Substance and Sexual Activity   Alcohol use: Never   Drug use: Never   Sexual activity: Not on file  Other Topics Concern   Not on file  Social History Narrative   Not on file   Social Determinants of Health   Financial Resource Strain: Not on file  Food Insecurity: No Food Insecurity (08/16/2021)   Hunger Vital Sign    Worried About Running Out of Food in the Last Year: Never true    Ran Out of Food in the Last Year: Never true  Transportation Needs: Not on file  Physical Activity: Not on file  Stress: Not on file  Social Connections: Not on file  Intimate Partner Violence: Not on file    Functional Status Survey:  No family history on file.  Health Maintenance  Topic Date Due   COVID-19 Vaccine (7 - 2023-24 season) 04/05/2022   INFLUENZA VACCINE  11/08/2022   Medicare Annual Wellness (AWV)  07/19/2023   DTaP/Tdap/Td (3 - Td or Tdap) 07/18/2032   Pneumonia Vaccine 78+ Years old  Completed   DEXA SCAN  Completed   Zoster Vaccines- Shingrix  Completed   HPV VACCINES  Aged Out    Allergies  Allergen Reactions   Alphagan P [Brimonidine Tartrate] Other (See Comments)    Unknown reaction Listed on MAR   Azopt [Brinzolamide] Other (See Comments)    Unknown reaction Listed on MAR   Lumigan [Bimatoprost] Other (See Comments)     Unknown reaction Listed on [facility]    Outpatient Encounter Medications as of 08/28/2022  Medication Sig   apixaban (ELIQUIS) 2.5 MG TABS tablet Take 1 tablet (2.5 mg total) by mouth 2 (two) times daily.   atorvastatin (LIPITOR) 10 MG tablet Take 5 mg by mouth daily.   B Complex-Folic Acid (B COMPLEX VITAMINS, W/ FA, PO) Take 1 tablet by mouth daily. Vitamin B complex - Folic Acid 0.4 mg   Calcium Carbonate-Vitamin D 600-400 MG-UNIT tablet Take 1 tablet by mouth daily.   melatonin 3 MG TABS tablet Take 3 mg by mouth at bedtime.   metoprolol tartrate (LOPRESSOR) 25 MG tablet Take 12.5 mg by mouth as needed. Give 0.5 tablet orally two times a day for SVT HOLD IF HR < 50   MULTIPLE VITAMIN-FOLIC ACID PO Take 1 tablet by mouth daily.   omeprazole (PRILOSEC) 20 MG capsule Take 20 mg by mouth daily.    Sodium Fluoride (PREVIDENT 5000 BOOSTER PLUS) 1.1 % PSTE Place 1 application. onto teeth at bedtime.   timolol (TIMOPTIC) 0.5 % ophthalmic solution Place 1 drop into both eyes daily.   No facility-administered encounter medications on file as of 08/28/2022.    Review of Systems  Constitutional: Negative.   HENT: Negative.    Respiratory: Negative.    Cardiovascular: Negative.   Gastrointestinal: Negative.   Endocrine: Negative.   Genitourinary: Negative.   Musculoskeletal:  Positive for arthralgias.  Psychiatric/Behavioral:  Positive for confusion.   All other systems reviewed and are negative.   There were no vitals filed for this visit. There is no height or weight on file to calculate BMI. Physical Exam Vitals and nursing note reviewed.  Constitutional:      Appearance: Normal appearance.  HENT:     Head: Normocephalic.     Mouth/Throat:     Mouth: Mucous membranes are moist.  Eyes:     Extraocular Movements: Extraocular movements intact.     Pupils: Pupils are equal, round, and reactive to light.  Cardiovascular:     Rate and Rhythm: Normal rate and regular rhythm.  Pulmonary:      Effort: Pulmonary effort is normal.     Breath sounds: Normal breath sounds.  Musculoskeletal:        General: Normal range of motion.     Cervical back: Normal range of motion.     Comments: Uses walker to ambulate  Skin:    General: Skin is warm and dry.  Neurological:     General: No focal deficit present.     Mental Status: She is alert.     Comments: Oriented x 1  Psychiatric:        Mood and Affect: Mood normal.     Labs reviewed: Basic Metabolic Panel: Recent Labs  06/12/22 0630  NA 141  K 4.3  CL 104  CO2 33*  BUN 22*  CREATININE 1.0  CALCIUM 8.8   Liver Function Tests: Recent Labs    06/12/22 0630  AST 23  ALT 15  ALKPHOS 76  ALBUMIN 3.7   No results for input(s): "LIPASE", "AMYLASE" in the last 8760 hours. No results for input(s): "AMMONIA" in the last 8760 hours. CBC: Recent Labs    06/12/22 0630  WBC 5.3  NEUTROABS 3,593.00  HGB 14.9  HCT 45  PLT 221   Cardiac Enzymes: No results for input(s): "CKTOTAL", "CKMB", "CKMBINDEX", "TROPONINI" in the last 8760 hours. BNP: Invalid input(s): "POCBNP" Lab Results  Component Value Date   HGBA1C 5.9 (H) 11/04/2019   Lab Results  Component Value Date   TSH 3.96 06/12/2022   Lab Results  Component Value Date   VITAMINB12 695 01/21/2020   No results found for: "FOLATE" Lab Results  Component Value Date   IRON 44 01/21/2020   TIBC 273 01/21/2020   FERRITIN 63 01/21/2020    Imaging and Procedures obtained prior to SNF admission: ECHOCARDIOGRAM COMPLETE  Result Date: 08/27/2021    ECHOCARDIOGRAM REPORT   Patient Name:   Bianca [name] Date of Exam: 08/27/2021 Medical Rec #:  [id number]        Height:       58.0 in Accession #:    [id number]       Weight:       114.9 lb Date of Birth:  Jun 25, 1922         BSA:          1.439 m Patient Age:    89 years         BP:           103/92 mmHg Patient Gender: F                HR:           124 bpm. Exam Location:  Inpatient Procedure: 2D Echo, Cardiac  Doppler and Color Doppler Indications:    CHF  History:        Patient has prior history of Echocardiogram examinations, most                 recent 11/05/2019. Arrythmias:Tachycardia.  Sonographer:    Eduard Roux Referring Phys: [id number] RAVI PAHWANI IMPRESSIONS  1. Left ventricular ejection fraction, by estimation, is 60 to 65%. The left ventricle has normal function. The left ventricle has no regional wall motion abnormalities. Indeterminate diastolic filling due to E-A fusion.  2. Right ventricular systolic function is moderately reduced. The right ventricular size is mildly enlarged. There is moderately elevated pulmonary artery systolic pressure.  3. Left atrial size was mildly dilated.  4. Right atrial size was mildly dilated.  5. The mitral valve is normal in structure. Trivial mitral valve regurgitation. No evidence of mitral stenosis.  6. Tricuspid valve regurgitation is severe.  7. The aortic valve is tricuspid. Aortic valve regurgitation is mild. No aortic stenosis is present.  8. The inferior vena cava is dilated in size with <50% respiratory variability, suggesting right atrial pressure of 15 mmHg. Comparison(s): No significant change from prior study. Prior images reviewed side by side. The right ventricular hypertrophy is worse. Systolic PA pressure is higher and tricuspid insufficiency is worse. FINDINGS  Left Ventricle: Left ventricular ejection fraction, by estimation, is 60 to 65%. The left ventricle has normal function. The left ventricle has no regional  wall motion abnormalities. The left ventricular internal cavity size was normal in size. There is  no left ventricular hypertrophy. Indeterminate diastolic filling due to E-A fusion. Right Ventricle: The right ventricular size is mildly enlarged. No increase in right ventricular wall thickness. Right ventricular systolic function is moderately reduced. There is moderately elevated pulmonary artery systolic pressure. The tricuspid regurgitant  velocity is 3.23 m/s, and with an assumed right atrial pressure of 15 mmHg, the estimated right ventricular systolic pressure is 56.7 mmHg. Left Atrium: Left atrial size was mildly dilated. Right Atrium: Right atrial size was mildly dilated. Pericardium: There is no evidence of pericardial effusion. Mitral Valve: The mitral valve is normal in structure. Trivial mitral valve regurgitation. No evidence of mitral valve stenosis. Tricuspid Valve: The tricuspid valve is normal in structure. Tricuspid valve regurgitation is severe. No evidence of tricuspid stenosis. Aortic Valve: The aortic valve is tricuspid. Aortic valve regurgitation is mild. No aortic stenosis is present. Aortic valve peak gradient measures 3.8 mmHg. Pulmonic Valve: The pulmonic valve was normal in structure. Pulmonic valve regurgitation is trivial. No evidence of pulmonic stenosis. Aorta: The aortic root is normal in size and structure. Venous: The inferior vena cava is dilated in size with less than 50% respiratory variability, suggesting right atrial pressure of 15 mmHg. The inferior vena cava and the hepatic vein show a pattern of systolic flow reversal, suggestive of tricuspid regurgitation. IAS/Shunts: No atrial level shunt detected by color flow Doppler.  LEFT VENTRICLE PLAX 2D LVIDd:         2.70 cm LVIDs:         2.05 cm LV PW:         1.10 cm LV IVS:        1.10 cm LVOT diam:     1.80 cm LV SV:         32 LV SV Index:   22 LVOT Area:     2.54 cm  RIGHT VENTRICLE            IVC RV Basal diam:  4.10 cm    IVC diam: 2.20 cm RV Mid diam:    3.70 cm RV S prime:     6.39 cm/s LEFT ATRIUM             Index        RIGHT ATRIUM           Index LA diam:        3.90 cm 2.71 cm/m   RA Area:     18.20 cm LA Vol (A2C):   39.8 ml 27.66 ml/m  RA Volume:   53.00 ml  36.83 ml/m LA Vol (A4C):   34.9 ml 24.25 ml/m LA Biplane Vol: 38.6 ml 26.83 ml/m  AORTIC VALVE                 PULMONIC VALVE AV Area (Vmax): 2.00 cm     PV Vmax:       0.60 m/s AV Vmax:         97.70 cm/s   PV Peak grad:  1.4 mmHg AV Peak Grad:   3.8 mmHg LVOT Vmax:      76.90 cm/s LVOT Vmean:     45.200 cm/s LVOT VTI:       0.125 m  AORTA Ao Root diam: 2.90 cm Ao Asc diam:  2.80 cm TRICUSPID VALVE TR Peak grad:   41.7 mmHg TR Vmax:        323.00 cm/s  SHUNTS Systemic VTI:  0.12 m Systemic Diam: 1.80 cm Thurmon Fair MD Electronically signed by Thurmon Fair MD Signature Date/Time: 08/27/2021/12:15:15 PM    Final    DG Chest 2 View  Result Date: 08/26/2021 CLINICAL DATA:  Chest pain.  Bradycardia. EXAM: CHEST - 2 VIEW COMPARISON:  08/13/2021 FINDINGS: Moderate cardiomegaly remains stable. New small right pleural effusion. Increased diffuse interstitial prominence suspicious for mild pulmonary edema. No evidence of pulmonary consolidation. IMPRESSION: Mild congestive heart failure with small right pleural effusion. Electronically Signed   By: Danae Orleans M.D.   On: 08/26/2021 10:14    Assessment/Plan 1. Stage 3a chronic kidney disease (HCC) Creatinine within normal limits, GFR 48  2. Pulmonary emphysema, unspecified emphysema type (HCC) No recent complaints for cardiopulmonary disease  3. History of stroke Stroke 3 years ago continues on apixaban and atorvastatin as well as metoprolol for blood pressure  4. Mixed hyperlipidemia LDL at goal on low-dose atorvastatin  5. Senile dementia (HCC) Communicates answers to simple questions pretty well seems a little bit confused    Family/ staff Communication:   Labs/tests ordered:  Bertram Millard. Hyacinth Meeker, MD [facility] [address] [place], [place] [address] Office [phone]

## 2022-09-04 ENCOUNTER — Non-Acute Institutional Stay: Payer: Medicare Other | Admitting: Family Medicine

## 2022-09-04 DIAGNOSIS — F0390 Unspecified dementia without behavioral disturbance: Secondary | ICD-10-CM | POA: Diagnosis not present

## 2022-09-04 DIAGNOSIS — E441 Mild protein-calorie malnutrition: Secondary | ICD-10-CM | POA: Diagnosis not present

## 2022-09-04 DIAGNOSIS — I4892 Unspecified atrial flutter: Secondary | ICD-10-CM | POA: Diagnosis not present

## 2022-09-04 NOTE — Progress Notes (Signed)
Therapist, nutritional Palliative Care Consult Note Telephone: [phone]  Fax: [phone]   Date of encounter: 09/04/22 4:53 PM PATIENT NAME: Bianca [name] [address] [place] [address]   [phone] (home)  DOB: 08/13/1922 MRN: [id number] PRIMARY CARE PROVIDER:    Mahlon Gammon, MD,  [address] [place] [address] [phone]  REFERRING PROVIDER:   Mahlon Gammon, MD [address] [place],  [place] [address] [phone]  Health Care Agent/Health Care Power of Attorney:    Contact Information     Name Relation Home Work Mobile   [place]   [phone]   Alfonse Flavors   [phone]   Myrtis Hopping   [phone]        I met face to face with patient in [facility] facility. Spoke to [employer] by phone and provided patient update. Palliative Care was asked to follow this patient by consultation request of Mahlon Gammon, MD to address advance care planning and complex medical decision making. This is a follow up visit.   CODE STATUS: DNR and MOST   MOST as of 09/06/21: DNR/DNI with comfort measures Use of antibiotics and IV fluids on a case by case, time limited basis No feeding tube   ASSESSMENT AND / RECOMMENDATIONS:  PPS: 40 %   Senile Dementia FAST 7 score 6e Not on current meds for dementia  Mild protein calorie malnutrition Small decline in weight. Eats well majority of meals. Continue to offer frequent snacks and favorite foods Weekly weight and if appetite decreases consider potential appetite stimulant.  Paroxysmal atrial flutter  Rate controlled, continue Lopressor  Encourage adequate fluid intake. Anticoagulated on reduced dose Eliquis given age and fall risk with no evidence of bleeding.                                                                              Follow up Palliative Care Visit:  Palliative Care continuing to follow  up by monitoring for changes in appetite, weight, functional and cognitive status for chronic disease progression and management in agreement with patient's stated goals of care. Next visit in 4 weeks or prn.  This visit was coded based on medical decision making (MDM).  Chief Complaint  Palliative Care is continuing to follow up patient for chronic medical  management of symptoms in setting of dementia.  HISTORY OF PRESENT ILLNESS: Bianca [name] is a 89 y.o. year old female with dementia.  She is seen sitting in the dining room eating with peer residents.  Pt had eaten all of her meat and about 1/3 of her meal.  Unable to give any significant history.  Denies pain, SOB.  Has partially eaten meal in front of her but states she has not eaten. Staff indicate she is pleasantly confused and at baseline mentation with no recent falls.   ACTIVITIES OF DAILY LIVING: CONTINENT OF BLADDER? Yes CONTINENT OF BOWEL? No BATHING/DRESSING/FEEDING? Set up with feeding, total with bathing and dressing  MOBILITY:   INDEPENDENTLY AMBULATORY?  Yes   AMBULATORY WITH ASSISTIVE DEVICE: Rollator   APPETITE? 50-75% of meals Weight 06/08/22 101.4 lbs, Jan 104.0, Dec 23  102.8,  99.2 lbs on 08/08/22  CURRENT PROBLEM LIST:  Patient Active Problem List   Diagnosis Date Noted   Osteoarthritis, multiple sites 04/27/2022   Hammer toes, bilateral 01/01/2022   Onychomycosis 01/01/2022   Pain of upper abdomen 09/25/2021   Chest pain 08/26/2021   Junctional tachycardia 08/13/2021   Chest wall pain 01/19/2021   Left knee pain 01/11/2021   Weight gain 07/06/2020   Anemia, iron deficiency 01/22/2020   Syncope    Prolonged QT interval    Paroxysmal atrial flutter (HCC)    Long term current use of anticoagulant    Mixed hyperlipidemia    History of stroke    Diastolic dysfunction    Fall 01/09/2020   Depression, recurrent (HCC) 11/07/2019   Stroke (HCC) 11/05/2019   Tachycardia 11/03/2019   Hypotension 11/03/2019    Sinus bradycardia 11/02/2019   Acute lower UTI 11/02/2019   Senile dementia (HCC) 10/26/2019   CKD (chronic kidney disease) stage 3, GFR 30-59 ml/min (HCC) 09/30/2019   Edema 06/11/2019   Metatarsalgia 06/11/2019   GERD (gastroesophageal reflux disease) 05/14/2019   Paroxysmal SVT (supraventricular tachycardia) 04/27/2019   Pneumonia due to COVID-19 virus 04/27/2019   Chronic diarrhea 04/27/2019   Urinary frequency 04/27/2019   Protein-calorie malnutrition (HCC) 04/27/2019   Emphysema of lung (HCC) 04/27/2019   PAST MEDICAL HISTORY:  Active Ambulatory Problems    Diagnosis Date Noted   Paroxysmal SVT (supraventricular tachycardia) 04/27/2019   Pneumonia due to COVID-19 virus 04/27/2019   Chronic diarrhea 04/27/2019   Urinary frequency 04/27/2019   Protein-calorie malnutrition (HCC) 04/27/2019   Emphysema of lung (HCC) 04/27/2019   GERD (gastroesophageal reflux disease) 05/14/2019   Edema 06/11/2019   Metatarsalgia 06/11/2019   CKD (chronic kidney disease) stage 3, GFR 30-59 ml/min (HCC) 09/30/2019   Senile dementia (HCC) 10/26/2019   Sinus bradycardia 11/02/2019   Acute lower UTI 11/02/2019   Tachycardia 11/03/2019   Hypotension 11/03/2019   Stroke (HCC) 11/05/2019   Depression, recurrent (HCC) 11/07/2019   Fall 01/09/2020   Prolonged QT interval    Paroxysmal atrial flutter (HCC)    Long term current use of anticoagulant    Mixed hyperlipidemia    History of stroke    Diastolic dysfunction    Syncope    Anemia, iron deficiency 01/22/2020   Weight gain 07/06/2020   Left knee pain 01/11/2021   Chest wall pain 01/19/2021   Junctional tachycardia 08/13/2021   Chest pain 08/26/2021   Pain of upper abdomen 09/25/2021   Hammer toes, bilateral 01/01/2022   Onychomycosis 01/01/2022   Osteoarthritis, multiple sites 04/27/2022   Resolved Ambulatory Problems    Diagnosis Date Noted   Skin tear of right lower leg without complication 05/14/2019   Infected wound  07/27/2019   Altered mental status 11/02/2019   Cellulitis of right anterior lower leg 01/26/2021   Delayed wound healing 02/17/2021   Soreness of tongue 08/03/2021   Past Medical History:  Diagnosis Date   Atrial flutter, paroxysmal (HCC)    COVID-19    Dementia (HCC)    Hyperlipidemia    Hypertension    PSVT (paroxysmal supraventricular tachycardia)    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 (HCC)    SOCIAL HX:  Social History   Tobacco Use   Smoking status: Never   Smokeless tobacco: Never  Substance Use Topics   Alcohol use: Never   FAMILY HX: No family history on file.     Preferred Pharmacy: ALLERGIES:  Allergies  Allergen Reactions   Alphagan P [Brimonidine Tartrate] Other (See Comments)    Unknown reaction Listed on MAR   Azopt [Brinzolamide] Other (See Comments)    Unknown reaction Listed on MAR   Lumigan [Bimatoprost] Other (See Comments)    Unknown reaction Listed on MAR     PERTINENT MEDICATIONS:  Outpatient Encounter Medications as of 09/04/2022  Medication Sig   apixaban (ELIQUIS) 2.5 MG TABS tablet Take 1 tablet (2.5 mg total) by mouth 2 (two) times daily.   atorvastatin (LIPITOR) 10 MG tablet Take 5 mg by mouth daily.   B Complex-Folic Acid (B COMPLEX VITAMINS, W/ FA, PO) Take 1 tablet by mouth daily. Vitamin B complex - Folic Acid 0.4 mg   Calcium Carbonate-Vitamin D 600-400 MG-UNIT tablet Take 1 tablet by mouth daily.   melatonin 3 MG TABS tablet Take 3 mg by mouth at bedtime.   metoprolol tartrate (LOPRESSOR) 25 MG tablet Take 12.5 mg by mouth as needed. Give 0.5 tablet orally two times a day for SVT HOLD IF HR < 50   MULTIPLE VITAMIN-FOLIC ACID PO Take 1 tablet by mouth daily.   omeprazole (PRILOSEC) 20 MG capsule Take 20 mg by mouth daily.    Sodium Fluoride (PREVIDENT 5000 BOOSTER PLUS) 1.1 % PSTE Place 1 application. onto teeth at bedtime.   timolol (TIMOPTIC) 0.5 % ophthalmic solution Place  1 drop into both eyes daily.   No facility-administered encounter medications on file as of 09/04/2022.    History obtained from review of EMR, discussion with facility staff/caregiver and/or patient.      I reviewed available labs, medications, imaging, studies and related documents from the EMR.  There were no new records/imaging since last visit. Records reviewed and summarized above.   Physical Exam: GENERAL: NAD LUNGS: CTAB, no increased work of breathing, room air CARDIAC:  IRIR, rate controlled w/No MRG, No edema/cyanosis ABD:  Normo-active BS x 4 quads, soft, non-tender EXTREMITIES: Normal ROM, no deformity, strength equal, Yes muscle atrophy/subcutaneous fat loss to BLE  NEURO:  Cognitive Impairment, forgetful but able to follow simple commands  PSYCH:  non-anxious affect, A & O to self, pleasantly confused  Thank you for the opportunity to participate in the care of Kerin Perna. Please call our main office at [phone] if we can be of additional assistance.    Joycelyn Man FNP-C  [email or web address]  Phone:  [phone]

## 2022-09-07 DIAGNOSIS — G4700 Insomnia, unspecified: Secondary | ICD-10-CM | POA: Diagnosis not present

## 2022-09-07 DIAGNOSIS — F330 Major depressive disorder, recurrent, mild: Secondary | ICD-10-CM | POA: Diagnosis not present

## 2022-09-11 ENCOUNTER — Encounter: Payer: Self-pay | Admitting: Family Medicine

## 2022-09-17 ENCOUNTER — Non-Acute Institutional Stay (SKILLED_NURSING_FACILITY): Payer: Medicare Other | Admitting: Nurse Practitioner

## 2022-09-17 ENCOUNTER — Encounter: Payer: Self-pay | Admitting: Nurse Practitioner

## 2022-09-17 DIAGNOSIS — D509 Iron deficiency anemia, unspecified: Secondary | ICD-10-CM | POA: Diagnosis not present

## 2022-09-17 DIAGNOSIS — F0390 Unspecified dementia without behavioral disturbance: Secondary | ICD-10-CM

## 2022-09-17 DIAGNOSIS — I639 Cerebral infarction, unspecified: Secondary | ICD-10-CM

## 2022-09-17 DIAGNOSIS — F339 Major depressive disorder, recurrent, unspecified: Secondary | ICD-10-CM | POA: Diagnosis not present

## 2022-09-17 DIAGNOSIS — E782 Mixed hyperlipidemia: Secondary | ICD-10-CM

## 2022-09-17 DIAGNOSIS — M159 Polyosteoarthritis, unspecified: Secondary | ICD-10-CM

## 2022-09-17 DIAGNOSIS — K219 Gastro-esophageal reflux disease without esophagitis: Secondary | ICD-10-CM

## 2022-09-17 DIAGNOSIS — N1831 Chronic kidney disease, stage 3a: Secondary | ICD-10-CM

## 2022-09-17 DIAGNOSIS — I471 Supraventricular tachycardia, unspecified: Secondary | ICD-10-CM

## 2022-09-17 DIAGNOSIS — M150 Primary generalized (osteo)arthritis: Secondary | ICD-10-CM

## 2022-09-17 NOTE — Assessment & Plan Note (Signed)
Senile dementia since CVA, no behavioral issues. MMSE 15/30 03/20/21 

## 2022-09-17 NOTE — Assessment & Plan Note (Signed)
Bun/creat 22/1.0 06/12/22 

## 2022-09-17 NOTE — Assessment & Plan Note (Signed)
01/21/20 Iron 44, ferritin 63, Vit B12 695, Hgb 14.9 06/12/22 

## 2022-09-17 NOTE — Assessment & Plan Note (Signed)
11/02/19 CVA (MRI cevebral small acute infarcts in the posterior left hemisphere in the left PCA and MCA watershed area. Takes Eliquis.  

## 2022-09-17 NOTE — Assessment & Plan Note (Signed)
Stable, takes Omeprazole. Hgb 14.9 06/12/22 

## 2022-09-17 NOTE — Assessment & Plan Note (Signed)
Hx of tachy and bradycardia, off Amiodarone 01/2020 in hospital due to prolonged QT interval. On Metoprolol bid and prn, on Eliquis

## 2022-09-17 NOTE — Assessment & Plan Note (Signed)
takes Atorvastatin, LDL 41 09/19/21   

## 2022-09-17 NOTE — Assessment & Plan Note (Signed)
OA lower back, aches, ambulates with walker.

## 2022-09-17 NOTE — Progress Notes (Signed)
Location:   SNF [facility] Room Number: 40A Place of Service:  SNF (31) Provider: [facility] Glennette Galster NP  Mahlon Gammon, MD  Patient Care Team: Mahlon Gammon, MD as PCP - General (Internal Medicine)  Extended Emergency Contact Information Primary Emergency Contact: [name],[name] Mobile Phone: [phone] Relation: Nephew Secondary Emergency Contact: [name],[name] Mobile Phone: [phone] Relation: Friend  Code Status:  DNR Goals of care: Advanced Directive information    07/19/2022   10:41 AM  Advanced Directives  Does Patient Have a Medical Advance Directive? Yes  Type of Estate agent of [place];Living will;Out of facility DNR (pink MOST or yellow form)  Does patient want to make changes to medical advance directive? No - Patient declined  Copy of Healthcare Power of Attorney in Chart? Yes - validated most recent copy scanned in chart (See row information)  Pre-existing out of facility DNR order (yellow form or pink MOST form) Pink MOST form placed in chart (order not valid for inpatient use);Yellow form placed in chart (order not valid for inpatient use)     Chief Complaint  Patient presents with  . Medical Management of Chronic Issues    HPI:  Pt is a 89 y.o. female seen today for medical management of chronic diseases.        Hx of emphysema, stable.  Edema, not apparent, off Torsemide. BNP 798 08/26/21, echo 08/27/21 EF 60-65%             PSVT off Amiodarone 01/2020 in hospital due to prolonged QT interval. On Metoprolol bid and prn, on Eliquis             GERD, takes Omeprazole. Hgb 14.9 06/12/22             Depression, stable, off Sertraline.              CKD Bun/creat 22/1.0 06/12/22             Senile dementia since CVA, no behavioral issues. MMSE 15/30 03/20/21             11/02/19 CVA (MRI cevebral small acute infarcts in the posterior left hemisphere in the left PCA and MCA watershed area. Takes Eliquis.              Anemia, 01/21/20  Iron 44, ferritin 63, Vit B12 695, Hgb 14.9 06/12/22             Hyperlipidemia, takes Atorvastatin, LDL 41 09/19/21             OA lower back, aches, ambulates with walker.                  Past Medical History:  Diagnosis Date  . Atrial flutter, paroxysmal (HCC)   . COVID-19   . Dementia (HCC)   . Diastolic dysfunction   . Hyperlipidemia   . Hypertension   . PSVT (paroxysmal supraventricular tachycardia)   . Stroke (HCC)   . 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 No pertinent surgical history.  Allergies  Allergen Reactions  . Alphagan P [Brimonidine Tartrate] Other (See Comments)    Unknown reaction Listed on MAR  . Azopt [Brinzolamide] Other (See Comments)    Unknown reaction Listed on MAR  . Lumigan [Bimatoprost] Other (See Comments)    Unknown reaction Listed on MAR    Allergies as of 09/17/2022       Reactions   Alphagan P [brimonidine  Tartrate] Other (See Comments)   Unknown reaction Listed on MAR   Azopt [brinzolamide] Other (See Comments)   Unknown reaction Listed on MAR   Lumigan [bimatoprost] Other (See Comments)   Unknown reaction Listed on [facility]        Medication List        Accurate as of September 17, 2022  2:07 PM. If you have any questions, ask your nurse or doctor.          apixaban 2.5 MG Tabs tablet Commonly known as: ELIQUIS Take 1 tablet (2.5 mg total) by mouth 2 (two) times daily.   atorvastatin 10 MG tablet Commonly known as: LIPITOR Take 5 mg by mouth daily.   B COMPLEX VITAMINS (W/ FA) PO Take 1 tablet by mouth daily. Vitamin B complex - Folic Acid 0.4 mg   Calcium Carbonate-Vitamin D 600-400 MG-UNIT tablet Take 1 tablet by mouth daily.   melatonin 3 MG Tabs tablet Take 3 mg by mouth at bedtime.   metoprolol tartrate 25 MG tablet Commonly known as: LOPRESSOR Take 12.5 mg by mouth as needed. Give 0.5 tablet orally two times a day for SVT HOLD IF  HR < 50   MULTIPLE VITAMIN-FOLIC ACID PO Take 1 tablet by mouth daily.   omeprazole 20 MG capsule Commonly known as: PRILOSEC Take 20 mg by mouth daily.   PreviDent 5000 Booster Plus 1.1 % Pste Generic drug: Sodium Fluoride Place 1 application. onto teeth at bedtime.   timolol 0.5 % ophthalmic solution Commonly known as: TIMOPTIC Place 1 drop into both eyes daily.        Review of Systems  Constitutional:  Negative for activity change and fever.  HENT:  Positive for hearing loss. Negative for congestion and trouble swallowing.   Eyes:  Negative for visual disturbance.  Respiratory:  Positive for cough. Negative for shortness of breath and wheezing.        Occasionally hacking cough  Cardiovascular:  Negative for leg swelling.  Gastrointestinal:  Negative for abdominal pain and constipation.  Genitourinary:  Positive for frequency. Negative for dysuria and urgency.  Musculoskeletal:  Positive for arthralgias, back pain and gait problem.  Skin:  Negative for color change.  Neurological:  Negative for speech difficulty, weakness and light-headedness.       Memory lapses.   Psychiatric/Behavioral:  Positive for confusion. Negative for sleep disturbance. The patient is not nervous/anxious.        Baseline mentation.     Immunization History  Administered Date(s) Administered  . DTaP 09/05/2004  . Influenza, High Dose Seasonal PF 01/06/2014, 11/08/2018  . Influenza, Quadrivalent, Recombinant, Inj, Pf 01/09/2018, 01/21/2019  . Influenza-Unspecified 11/07/2013, 01/14/2020, 01/26/2021, 01/31/2022  . Moderna Covid-19 Vaccine Bivalent Booster 48yrs & up 04/04/2021  . Moderna SARS-COV2 Booster Vaccination 02/08/2022  . Moderna Sars-Covid-2 Vaccination 05/09/2019, 06/06/2019, 02/22/2020, 09/06/2020  . Pneumococcal Conjugate-13 01/06/2014  . Pneumococcal Polysaccharide-23 09/05/2004  . Pneumococcal-Unspecified 09/05/2004, 10/18/2014  . Tdap 07/19/2022  . Zoster Recombinat  (Shingrix) 03/21/2021, 05/24/2021   Pertinent  Health Maintenance Due  Topic Date Due  . INFLUENZA VACCINE  11/08/2022  . DEXA SCAN  Completed      08/26/2021    8:30 PM 08/27/2021   10:00 AM 01/09/2022   12:36 PM 06/25/2022   12:28 PM 07/19/2022   10:41 AM  Fall Risk  Falls in the past year?   0 0 0  Was there an injury with Fall?   0 0 0  Fall Risk Category Calculator  0 0 0  Fall Risk Category (Retired)   Low    (RETIRED) Patient Fall Risk Level High fall risk High fall risk Low fall risk    Patient at Risk for Falls Due to   No Fall Risks No Fall Risks No Fall Risks  Fall risk Follow up   Falls evaluation completed Falls evaluation completed Falls evaluation completed   Functional Status Survey:    Vitals:   09/17/22 1357  BP: 122/80  Pulse: 84  Resp: 18  Temp: (!) 97.4 F (36.3 C)  SpO2: 92%  Weight: 98 lb 12.8 oz (44.8 kg)   Body mass index is 20.65 kg/m. Physical Exam Vitals and nursing note reviewed.  Constitutional:      Appearance: Normal appearance.  HENT:     Head: Normocephalic and atraumatic.     Nose: Nose normal.     Mouth/Throat:     Mouth: Mucous membranes are moist.  Eyes:     Extraocular Movements: Extraocular movements intact.     Conjunctiva/sclera: Conjunctivae normal.     Pupils: Pupils are equal, round, and reactive to light.  Cardiovascular:     Rate and Rhythm: Normal rate and regular rhythm.     Heart sounds: No murmur heard. Pulmonary:     Effort: Pulmonary effort is normal.     Breath sounds: No rales.  Abdominal:     General: Bowel sounds are normal.     Palpations: Abdomen is soft.     Tenderness: There is no abdominal tenderness.  Musculoskeletal:     Cervical back: Normal range of motion and neck supple.     Right lower leg: No edema.     Left lower leg: No edema.  Skin:    General: Skin is warm and dry.     Comments: Hammer toes most of her toes Yellow, thick toe nails.   Neurological:     General: No focal deficit  present.     Mental Status: She is alert and oriented to person, place, and time. Mental status is at baseline.     Gait: Gait abnormal.     Comments: Ambulates with walker.   Psychiatric:        Mood and Affect: Mood normal.        Behavior: Behavior normal.        Thought Content: Thought content normal.    Labs reviewed: Recent Labs    06/12/22 0630  NA 141  K 4.3  CL 104  CO2 33*  BUN 22*  CREATININE 1.0  CALCIUM 8.8   Recent Labs    06/12/22 0630  AST 23  ALT 15  ALKPHOS 76  ALBUMIN 3.7   Recent Labs    06/12/22 0630  WBC 5.3  NEUTROABS 3,593.00  HGB 14.9  HCT 45  PLT 221   Lab Results  Component Value Date   TSH 3.96 06/12/2022   Lab Results  Component Value Date   HGBA1C 5.9 (H) 11/04/2019   Lab Results  Component Value Date   CHOL 110 09/19/2021   HDL 55 09/19/2021   LDLCALC 41 09/19/2021   TRIG 56 09/19/2021   CHOLHDL 2.8 11/06/2019    Significant Diagnostic Results in last 30 days:  No results found.  Assessment/Plan  Paroxysmal SVT (supraventricular tachycardia) (HCC)  Hx of tachy and bradycardia, off Amiodarone 01/2020 in hospital due to prolonged QT interval. On Metoprolol bid and prn, on Eliquis  GERD (gastroesophageal reflux disease)  Stable, takes Omeprazole. Hgb  14.9 06/12/22  Depression, recurrent (HCC) stable, off Sertraline.   CKD (chronic kidney disease) stage 3, GFR 30-59 ml/min (HCC) Bun/creat 22/1.0 06/12/22  Senile dementia (HCC) Senile dementia since CVA, no behavioral issues. MMSE 15/30 03/20/21  Stroke (HCC)  11/02/19 CVA (MRI cevebral small acute infarcts in the posterior left hemisphere in the left PCA and MCA watershed area. Takes Eliquis.   Anemia, iron deficiency  01/21/20 Iron 44, ferritin 63, Vit B12 695, Hgb 14.9 06/12/22  Mixed hyperlipidemia takes Atorvastatin, LDL 41 09/19/21  Osteoarthritis, multiple sites   OA lower back, aches, ambulates with walker.    Family/ staff Communication: plan of care  reviewed with the patient and charge nurse.   Labs/tests ordered:  none  Time spend 25 minutes.

## 2022-09-17 NOTE — Assessment & Plan Note (Signed)
stable, off Sertraline.  

## 2022-10-05 ENCOUNTER — Non-Acute Institutional Stay (SKILLED_NURSING_FACILITY): Payer: Medicare Other | Admitting: Nurse Practitioner

## 2022-10-05 ENCOUNTER — Encounter: Payer: Self-pay | Admitting: Nurse Practitioner

## 2022-10-05 DIAGNOSIS — I471 Supraventricular tachycardia, unspecified: Secondary | ICD-10-CM | POA: Diagnosis not present

## 2022-10-05 DIAGNOSIS — Z8673 Personal history of transient ischemic attack (TIA), and cerebral infarction without residual deficits: Secondary | ICD-10-CM

## 2022-10-05 DIAGNOSIS — D509 Iron deficiency anemia, unspecified: Secondary | ICD-10-CM

## 2022-10-05 DIAGNOSIS — M159 Polyosteoarthritis, unspecified: Secondary | ICD-10-CM | POA: Diagnosis not present

## 2022-10-05 DIAGNOSIS — E782 Mixed hyperlipidemia: Secondary | ICD-10-CM

## 2022-10-05 DIAGNOSIS — K219 Gastro-esophageal reflux disease without esophagitis: Secondary | ICD-10-CM

## 2022-10-05 DIAGNOSIS — N1831 Chronic kidney disease, stage 3a: Secondary | ICD-10-CM | POA: Diagnosis not present

## 2022-10-05 DIAGNOSIS — F0390 Unspecified dementia without behavioral disturbance: Secondary | ICD-10-CM | POA: Diagnosis not present

## 2022-10-05 DIAGNOSIS — M150 Primary generalized (osteo)arthritis: Secondary | ICD-10-CM

## 2022-10-05 NOTE — Assessment & Plan Note (Signed)
takes Atorvastatin, LDL 41 09/19/21   

## 2022-10-05 NOTE — Assessment & Plan Note (Signed)
reported the patient c/o stomach pain 10/04/22. The patient was seen napping in bed, appears comfortable, easily aroused, denied pain or discomfort. Poor historian.  Dc Omeprazole Start Pantoprazole, observe the patient.

## 2022-10-05 NOTE — Assessment & Plan Note (Signed)
Bun/creat 22/1.0 06/12/22 

## 2022-10-05 NOTE — Progress Notes (Signed)
Location:   SNF [facility] Room Number: NO/40/A Place of Service:  SNF (31) Provider: [facility] Lyndi Holbein NP  Mahlon Gammon, MD  Patient Care Team: Mahlon Gammon, MD as PCP - General (Internal Medicine)  Extended Emergency Contact Information Primary Emergency Contact: [name],[name] Mobile Phone: [phone] Relation: Nephew Secondary Emergency Contact: [name],[name] Mobile Phone: [phone] Relation: Friend  Code Status: DNR Goals of care: Advanced Directive information    07/19/2022   10:41 AM  Advanced Directives  Does Patient Have a Medical Advance Directive? Yes  Type of Estate agent of [place];Living will;Out of facility DNR (pink MOST or yellow form)  Does patient want to make changes to medical advance directive? No - Patient declined  Copy of Healthcare Power of Attorney in Chart? Yes - validated most recent copy scanned in chart (See row information)  Pre-existing out of facility DNR order (yellow form or pink MOST form) Pink MOST form placed in chart (order not valid for inpatient use);Yellow form placed in chart (order not valid for inpatient use)     Chief Complaint  Patient presents with  . Acute Visit    Patient is being seen for stomach pain    HPI:  Pt is a 89 y.o. female seen today for an acute visit for reported the patient c/o stomach pain 10/04/22. The patient was seen napping in bed, appears comfortable, easily aroused, denied pain or discomfort. Poor historian.   Hx of emphysema, stable.  Edema, not apparent, off Torsemide. BNP 798 08/26/21, echo 08/27/21 EF 60-65%             PSVT off Amiodarone 01/2020 in hospital due to prolonged QT interval. On Metoprolol bid and prn, on Eliquis             GERD, takes Omeprazole. Hgb 14.9 06/12/22             Depression, stable, off Sertraline.              CKD Bun/creat 22/1.0 06/12/22             Senile dementia since CVA, no behavioral issues. MMSE 15/30 03/20/21             11/02/19 CVA  (MRI cevebral small acute infarcts in the posterior left hemisphere in the left PCA and MCA watershed area. Takes Eliquis.              Anemia, 01/21/20 Iron 44, ferritin 63, Vit B12 695, Hgb 14.9 06/12/22             Hyperlipidemia, takes Atorvastatin, LDL 41 09/19/21             OA lower back, aches, ambulates with walker.     Past Medical History:  Diagnosis Date  . Atrial flutter, paroxysmal (HCC)   . COVID-19   . Dementia (HCC)   . Diastolic dysfunction   . Hyperlipidemia   . Hypertension   . PSVT (paroxysmal supraventricular tachycardia)   . Stroke (HCC)   . 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 No pertinent surgical history.  Allergies  Allergen Reactions  . Alphagan P [Brimonidine Tartrate] Other (See Comments)    Unknown reaction Listed on MAR  . Azopt [Brinzolamide] Other (See Comments)    Unknown reaction Listed on MAR  . Lumigan [Bimatoprost] Other (See Comments)    Unknown reaction Listed on MAR    Allergies as of 10/05/2022  Reactions   Alphagan P [brimonidine Tartrate] Other (See Comments)   Unknown reaction Listed on MAR   Azopt [brinzolamide] Other (See Comments)   Unknown reaction Listed on MAR   Lumigan [bimatoprost] Other (See Comments)   Unknown reaction Listed on [facility]        Medication List        Accurate as of October 05, 2022  2:21 PM. If you have any questions, ask your nurse or doctor.          apixaban 2.5 MG Tabs tablet Commonly known as: ELIQUIS Take 1 tablet (2.5 mg total) by mouth 2 (two) times daily.   atorvastatin 10 MG tablet Commonly known as: LIPITOR Take 5 mg by mouth daily.   B COMPLEX VITAMINS (W/ FA) PO Take 1 tablet by mouth daily. Vitamin B complex - Folic Acid 0.4 mg   Calcium Carbonate-Vitamin D 600-400 MG-UNIT tablet Take 1 tablet by mouth daily.   melatonin 3 MG Tabs tablet Take 3 mg by mouth at bedtime.   metoprolol  tartrate 25 MG tablet Commonly known as: LOPRESSOR Take 12.5 mg by mouth as needed. Give 0.5 tablet orally two times a day for SVT HOLD IF HR < 50   MULTIPLE VITAMIN-FOLIC ACID PO Take 1 tablet by mouth daily.   omeprazole 20 MG capsule Commonly known as: PRILOSEC Take 20 mg by mouth daily.   PreviDent 5000 Booster Plus 1.1 % Pste Generic drug: Sodium Fluoride Place 1 application. onto teeth at bedtime.   timolol 0.5 % ophthalmic solution Commonly known as: TIMOPTIC Place 1 drop into both eyes daily.        Review of Systems  Unable to perform ROS: Dementia    Immunization History  Administered Date(s) Administered  . DTaP 09/05/2004  . Influenza, High Dose Seasonal PF 01/06/2014, 11/08/2018  . Influenza, Quadrivalent, Recombinant, Inj, Pf 01/09/2018, 01/21/2019  . Influenza-Unspecified 11/07/2013, 01/14/2020, 01/26/2021, 01/31/2022  . Moderna Covid-19 Vaccine Bivalent Booster 22yrs & up 04/04/2021  . Moderna SARS-COV2 Booster Vaccination 02/08/2022  . Moderna Sars-Covid-2 Vaccination 05/09/2019, 06/06/2019, 02/22/2020, 09/06/2020  . Pneumococcal Conjugate-13 01/06/2014  . Pneumococcal Polysaccharide-23 09/05/2004  . Pneumococcal-Unspecified 09/05/2004, 10/18/2014  . Tdap 07/19/2022  . Zoster Recombinat (Shingrix) 03/21/2021, 05/24/2021   Pertinent  Health Maintenance Due  Topic Date Due  . INFLUENZA VACCINE  11/08/2022  . DEXA SCAN  Completed      08/26/2021    8:30 PM 08/27/2021   10:00 AM 01/09/2022   12:36 PM 06/25/2022   12:28 PM 07/19/2022   10:41 AM  Fall Risk  Falls in the past year?   0 0 0  Was there an injury with Fall?   0 0 0  Fall Risk Category Calculator   0 0 0  Fall Risk Category (Retired)   Low    (RETIRED) Patient Fall Risk Level High fall risk High fall risk Low fall risk    Patient at Risk for Falls Due to   No Fall Risks No Fall Risks No Fall Risks  Fall risk Follow up   Falls evaluation completed Falls evaluation completed Falls  evaluation completed   Functional Status Survey:    Vitals:   10/05/22 1305  BP: (!) 103/55  Pulse: 86  Resp: 18  Temp: (!) 97.4 F (36.3 C)  SpO2: 92%  Weight: 98 lb 12.8 oz (44.8 kg)  Height: 4\' 10"  (1.473 m)   Body mass index is 20.65 kg/m. Physical Exam Vitals and nursing note reviewed.  Constitutional:  Appearance: Normal appearance.  HENT:     Head: Normocephalic and atraumatic.     Nose: Nose normal.     Mouth/Throat:     Mouth: Mucous membranes are moist.  Eyes:     Extraocular Movements: Extraocular movements intact.     Conjunctiva/sclera: Conjunctivae normal.     Pupils: Pupils are equal, round, and reactive to light.  Cardiovascular:     Rate and Rhythm: Normal rate and regular rhythm.     Heart sounds: No murmur heard. Pulmonary:     Effort: Pulmonary effort is normal.     Breath sounds: No rales.  Abdominal:     General: Bowel sounds are normal.     Palpations: Abdomen is soft.     Tenderness: There is no abdominal tenderness.  Musculoskeletal:     Cervical back: Normal range of motion and neck supple.     Right lower leg: No edema.     Left lower leg: No edema.  Skin:    General: Skin is warm and dry.     Comments: Hammer toes most of her toes Yellow, thick toe nails.   Neurological:     General: No focal deficit present.     Mental Status: She is alert and oriented to person, place, and time. Mental status is at baseline.     Gait: Gait abnormal.     Comments: Ambulates with walker.   Psychiatric:        Mood and Affect: Mood normal.        Behavior: Behavior normal.        Thought Content: Thought content normal.    Labs reviewed: Recent Labs    06/12/22 0630  NA 141  K 4.3  CL 104  CO2 33*  BUN 22*  CREATININE 1.0  CALCIUM 8.8   Recent Labs    06/12/22 0630  AST 23  ALT 15  ALKPHOS 76  ALBUMIN 3.7   Recent Labs    06/12/22 0630  WBC 5.3  NEUTROABS 3,593.00  HGB 14.9  HCT 45  PLT 221   Lab Results   Component Value Date   TSH 3.96 06/12/2022   Lab Results  Component Value Date   HGBA1C 5.9 (H) 11/04/2019   Lab Results  Component Value Date   CHOL 110 09/19/2021   HDL 55 09/19/2021   LDLCALC 41 09/19/2021   TRIG 56 09/19/2021   CHOLHDL 2.8 11/06/2019    Significant Diagnostic Results in last 30 days:  No results found.  Assessment/Plan: GERD (gastroesophageal reflux disease) reported the patient c/o stomach pain 10/04/22. The patient was seen napping in bed, appears comfortable, easily aroused, denied pain or discomfort. Poor historian.  Dc Omeprazole Start Pantoprazole, observe the patient.   Paroxysmal SVT (supraventricular tachycardia) (HCC)  off Amiodarone 01/2020 in hospital due to prolonged QT interval. On Metoprolol bid and prn, on Eliquis  CKD (chronic kidney disease) stage 3, GFR 30-59 ml/min (HCC) Bun/creat 22/1.0 06/12/22  Senile dementia (HCC)  Senile dementia since CVA, no behavioral issues. MMSE 15/30 03/20/21  History of stroke  11/02/19 CVA (MRI cevebral small acute infarcts in the posterior left hemisphere in the left PCA and MCA watershed area. Takes Eliquis.   Anemia, iron deficiency  01/21/20 Iron 44, ferritin 63, Vit B12 695, Hgb 14.9 06/12/22  Mixed hyperlipidemia  takes Atorvastatin, LDL 41 09/19/21  Osteoarthritis, multiple sites OA lower back, aches, ambulates with walker.     Family/ staff Communication: plan of care reviewed with  the patient and charge nurse.   Labs/tests ordered:  none  Time spend 35 minutes.

## 2022-10-05 NOTE — Assessment & Plan Note (Signed)
OA lower back, aches, ambulates with walker.  

## 2022-10-05 NOTE — Assessment & Plan Note (Signed)
Senile dementia since CVA, no behavioral issues. MMSE 15/30 03/20/21 

## 2022-10-05 NOTE — Assessment & Plan Note (Signed)
off Amiodarone 01/2020 in hospital due to prolonged QT interval. On Metoprolol bid and prn, on Eliquis 

## 2022-10-05 NOTE — Assessment & Plan Note (Signed)
01/21/20 Iron 44, ferritin 63, Vit B12 695, Hgb 14.9 06/12/22 

## 2022-10-05 NOTE — Assessment & Plan Note (Signed)
11/02/19 CVA (MRI cevebral small acute infarcts in the posterior left hemisphere in the left PCA and MCA watershed area. Takes Eliquis.  

## 2022-10-18 ENCOUNTER — Non-Acute Institutional Stay (SKILLED_NURSING_FACILITY): Payer: Medicare Other | Admitting: Nurse Practitioner

## 2022-10-18 ENCOUNTER — Encounter: Payer: Self-pay | Admitting: Nurse Practitioner

## 2022-10-18 DIAGNOSIS — M159 Polyosteoarthritis, unspecified: Secondary | ICD-10-CM | POA: Diagnosis not present

## 2022-10-18 DIAGNOSIS — E782 Mixed hyperlipidemia: Secondary | ICD-10-CM | POA: Diagnosis not present

## 2022-10-18 DIAGNOSIS — K219 Gastro-esophageal reflux disease without esophagitis: Secondary | ICD-10-CM | POA: Diagnosis not present

## 2022-10-18 DIAGNOSIS — F339 Major depressive disorder, recurrent, unspecified: Secondary | ICD-10-CM | POA: Diagnosis not present

## 2022-10-18 DIAGNOSIS — J439 Emphysema, unspecified: Secondary | ICD-10-CM

## 2022-10-18 DIAGNOSIS — D509 Iron deficiency anemia, unspecified: Secondary | ICD-10-CM

## 2022-10-18 DIAGNOSIS — F0390 Unspecified dementia without behavioral disturbance: Secondary | ICD-10-CM

## 2022-10-18 DIAGNOSIS — R609 Edema, unspecified: Secondary | ICD-10-CM

## 2022-10-18 DIAGNOSIS — I471 Supraventricular tachycardia, unspecified: Secondary | ICD-10-CM

## 2022-10-18 DIAGNOSIS — I639 Cerebral infarction, unspecified: Secondary | ICD-10-CM

## 2022-10-18 DIAGNOSIS — N1831 Chronic kidney disease, stage 3a: Secondary | ICD-10-CM

## 2022-10-18 NOTE — Assessment & Plan Note (Signed)
Bun/creat 22/1.0 06/12/22 

## 2022-10-18 NOTE — Assessment & Plan Note (Signed)
stable, off Sertraline.  

## 2022-10-18 NOTE — Assessment & Plan Note (Signed)
off Amiodarone 01/2020 in hospital due to prolonged QT interval. On Metoprolol bid and prn, on Eliquis 

## 2022-10-18 NOTE — Assessment & Plan Note (Signed)
01/21/20 Iron 44, ferritin 63, Vit B12 695, Hgb 14.9 06/12/22 

## 2022-10-18 NOTE — Assessment & Plan Note (Signed)
11/02/19 CVA (MRI cevebral small acute infarcts in the posterior left hemisphere in the left PCA and MCA watershed area. Takes Eliquis.  

## 2022-10-18 NOTE — Assessment & Plan Note (Signed)
Senile dementia since CVA, no behavioral issues. MMSE 15/30 03/20/21 

## 2022-10-18 NOTE — Progress Notes (Signed)
Location:  [facility] Room Number: NO/40/A Place of Service:  SNF (31) Provider:  Lonzell Dorris X, NP    Patient Care Team: Mahlon Gammon, MD as PCP - General (Internal Medicine)  Extended Emergency Contact Information Primary Emergency Contact: [name],[name] Mobile Phone: [phone] Relation: Nephew Secondary Emergency Contact: [name],[name] Mobile Phone: [phone] Relation: Friend  Code Status:  DNR Goals of care: Advanced Directive information    10/18/2022    8:58 AM  Advanced Directives  Does Patient Have a Medical Advance Directive? Yes  Type of Estate agent of [place];Living will;Out of facility DNR (pink MOST or yellow form)  Does patient want to make changes to medical advance directive? No - Patient declined  Copy of Healthcare Power of Attorney in Chart? Yes - validated most recent copy scanned in chart (See row information)  Pre-existing out of facility DNR order (yellow form or pink MOST form) Pink MOST form placed in chart (order not valid for inpatient use);Yellow form placed in chart (order not valid for inpatient use)     Chief Complaint  Patient presents with   Medical Management of Chronic Issues    Routine Visit   Quality Metric Gaps    Needs to discuss Covid vaccine.    HPI:  Pt is a 89 y.o. female seen today for medical management of chronic diseases.     Hx of emphysema, stable.  Edema, not apparent, off Torsemide. BNP 798 08/26/21, echo 08/27/21 EF 60-65%             PSVT off Amiodarone 01/2020 in hospital due to prolonged QT interval. On Metoprolol bid and prn, on Eliquis             GERD, no further c/o abd discomfort or pain since PPI switched from Omeprazole to Pantoprazole 10/05/22. Hgb 14.9 06/12/22             Depression, stable, off Sertraline.              CKD Bun/creat 22/1.0 06/12/22             Senile dementia since CVA, no behavioral issues. MMSE 15/30 03/20/21             11/02/19 CVA (MRI  cevebral small acute infarcts in the posterior left hemisphere in the left PCA and MCA watershed area. Takes Eliquis.              Anemia, 01/21/20 Iron 44, ferritin 63, Vit B12 695, Hgb 14.9 06/12/22             Hyperlipidemia, takes Atorvastatin, LDL 41 09/19/21             OA lower back, aches, ambulates with walker.        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 No pertinent surgical history.  Allergies  Allergen Reactions   Alphagan P [Brimonidine Tartrate] Other (See Comments)    Unknown reaction Listed on MAR   Azopt [Brinzolamide] Other (See Comments)    Unknown reaction Listed on MAR   Lumigan [Bimatoprost] Other (See Comments)    Unknown reaction Listed on [facility]  Outpatient Encounter Medications as of 10/18/2022  Medication Sig   apixaban (ELIQUIS) 2.5 MG TABS tablet Take 1 tablet (2.5 mg total) by mouth 2 (two) times daily.   atorvastatin (LIPITOR) 10 MG tablet Take 5 mg by mouth daily.   B Complex-Folic Acid (B COMPLEX VITAMINS, W/ FA, PO) Take 1 tablet by mouth daily. Vitamin B complex - Folic Acid 0.4 mg   Calcium Carbonate-Vitamin D 600-400 MG-UNIT tablet Take 1 tablet by mouth daily.   melatonin 3 MG TABS tablet Take 3 mg by mouth at bedtime.   metoprolol tartrate (LOPRESSOR) 25 MG tablet Take 12.5 mg by mouth as needed. Give 0.5 tablet orally two times a day for SVT HOLD IF HR < 50   MULTIPLE VITAMIN-FOLIC ACID PO Take 1 tablet by mouth daily.   pantoprazole (PROTONIX) 40 MG tablet Take 40 mg by mouth daily.   Sodium Fluoride (PREVIDENT 5000 BOOSTER PLUS) 1.1 % PSTE Place 1 application. onto teeth at bedtime.   timolol (TIMOPTIC) 0.5 % ophthalmic solution  Place 1 drop into both eyes daily.   [DISCONTINUED] omeprazole (PRILOSEC) 20 MG capsule Take 20 mg by mouth daily.    No facility-administered encounter medications on file as of 10/18/2022.    Review of Systems  Unable to perform ROS: Dementia    Immunization History  Administered Date(s) Administered   DTaP 09/05/2004   Influenza, High Dose Seasonal PF 01/06/2014, 11/08/2018   Influenza, Quadrivalent, Recombinant, Inj, Pf 01/09/2018, 01/21/2019   Influenza-Unspecified 11/07/2013, 01/14/2020, 01/26/2021, 01/31/2022   Moderna Covid-19 Vaccine Bivalent Booster 25yrs & up 04/04/2021   Moderna SARS-COV2 Booster Vaccination 02/08/2022   Moderna Sars-Covid-2 Vaccination 05/09/2019, 06/06/2019, 02/22/2020, 09/06/2020   Pneumococcal Conjugate-13 01/06/2014   Pneumococcal Polysaccharide-23 09/05/2004   Pneumococcal-Unspecified 09/05/2004, 10/18/2014   Tdap 07/19/2022   Zoster Recombinant(Shingrix) 03/21/2021, 05/24/2021   Pertinent  Health Maintenance Due  Topic Date Due   INFLUENZA VACCINE  11/08/2022   DEXA SCAN  Completed      08/26/2021    8:30 PM 08/27/2021   10:00 AM 01/09/2022   12:36 PM 06/25/2022   12:28 PM 07/19/2022   10:41 AM  Fall Risk  Falls in the past year?   0 0 0  Was there an injury with Fall?   0 0 0  Fall Risk Category Calculator   0 0 0  Fall Risk Category (Retired)   Low    (RETIRED) Patient Fall Risk Level High fall risk High fall risk Low fall risk    Patient at Risk for Falls Due to   No Fall Risks No Fall Risks No Fall Risks  Fall risk Follow up   Falls evaluation completed Falls evaluation completed Falls evaluation completed   Functional Status Survey:    Vitals:   10/18/22 0844  BP: 122/67  Pulse: (!) 56  Resp: 17  Temp: 98.8 F (37.1 C)  SpO2: 92%  Weight: 99 lb 1.6 oz (45 kg)  Height: 4\' 10"  (1.473 m)   Body mass index is 20.71 kg/m. Physical Exam Vitals and nursing note reviewed.  Constitutional:      Appearance: Normal appearance.   HENT:     Head: Normocephalic and atraumatic.     Nose: Nose normal.     Mouth/Throat:     Mouth: Mucous membranes are moist.  Eyes:     Extraocular Movements: Extraocular movements intact.     Conjunctiva/sclera: Conjunctivae normal.     Pupils: Pupils are equal, round, and reactive to light.  Cardiovascular:  Rate and Rhythm: Normal rate and regular rhythm.     Heart sounds: No murmur heard. Pulmonary:     Effort: Pulmonary effort is normal.     Breath sounds: No rales.  Abdominal:     General: Bowel sounds are normal.     Palpations: Abdomen is soft.     Tenderness: There is no abdominal tenderness.  Musculoskeletal:     Cervical back: Normal range of motion and neck supple.     Right lower leg: No edema.     Left lower leg: No edema.  Skin:    General: Skin is warm and dry.     Comments: Hammer toes most of her toes Yellow, thick toe nails.   Neurological:     General: No focal deficit present.     Mental Status: She is alert and oriented to person, place, and time. Mental status is at baseline.     Gait: Gait abnormal.     Comments: Ambulates with walker.   Psychiatric:        Mood and Affect: Mood normal.        Behavior: Behavior normal.        Thought Content: Thought content normal.     Labs reviewed: Recent Labs    06/12/22 0630  NA 141  K 4.3  CL 104  CO2 33*  BUN 22*  CREATININE 1.0  CALCIUM 8.8   Recent Labs    06/12/22 0630  AST 23  ALT 15  ALKPHOS 76  ALBUMIN 3.7   Recent Labs    06/12/22 0630  WBC 5.3  NEUTROABS 3,593.00  HGB 14.9  HCT 45  PLT 221   Lab Results  Component Value Date   TSH 3.96 06/12/2022   Lab Results  Component Value Date   HGBA1C 5.9 (H) 11/04/2019   Lab Results  Component Value Date   CHOL 110 09/19/2021   HDL 55 09/19/2021   LDLCALC 41 09/19/2021   TRIG 56 09/19/2021   CHOLHDL 2.8 11/06/2019    Significant Diagnostic Results in last 30 days:  No results found.  Assessment/Plan Emphysema  of lung (HCC) Hx of emphysema, stable.   Edema not apparent, off Torsemide. BNP 798 08/26/21, echo 08/27/21 EF 60-65%  Paroxysmal SVT (supraventricular tachycardia) (HCC)  off Amiodarone 01/2020 in hospital due to prolonged QT interval. On Metoprolol bid and prn, on Eliquis  GERD (gastroesophageal reflux disease) no further c/o abd discomfort or pain since PPI switched from Omeprazole to Pantoprazole 10/05/22. Hgb 14.9 06/12/22  Depression, recurrent (HCC)  stable, off Sertraline.   CKD (chronic kidney disease) stage 3, GFR 30-59 ml/min (HCC) Bun/creat 22/1.0 06/12/22  Senile dementia (HCC)  Senile dementia since CVA, no behavioral issues. MMSE 15/30 03/20/21  Stroke (HCC)  11/02/19 CVA (MRI cevebral small acute infarcts in the posterior left hemisphere in the left PCA and MCA watershed area. Takes Eliquis.   Anemia, iron deficiency 01/21/20 Iron 44, ferritin 63, Vit B12 695, Hgb 14.9 06/12/22  Mixed hyperlipidemia , takes Atorvastatin, LDL 41 09/19/21     Family/ staff Communication: plan of care reviewed with the patient and charge nurse.   Labs/tests ordered:  none  Time spend 35 minutes

## 2022-10-18 NOTE — Assessment & Plan Note (Signed)
Hx of emphysema, stable.

## 2022-10-18 NOTE — Assessment & Plan Note (Signed)
,   takes Atorvastatin, LDL 41 09/19/21

## 2022-10-18 NOTE — Assessment & Plan Note (Signed)
not apparent, off Torsemide. BNP 798 08/26/21, echo 08/27/21 EF 60-65% 

## 2022-10-18 NOTE — Assessment & Plan Note (Signed)
OA lower back, aches, ambulates with walker.  

## 2022-10-18 NOTE — Assessment & Plan Note (Signed)
no further c/o abd discomfort or pain since PPI switched from Omeprazole to Pantoprazole 10/05/22. Hgb 14.9 06/12/22

## 2022-10-30 DIAGNOSIS — M6281 Muscle weakness (generalized): Secondary | ICD-10-CM | POA: Diagnosis not present

## 2022-10-30 DIAGNOSIS — I471 Supraventricular tachycardia, unspecified: Secondary | ICD-10-CM | POA: Diagnosis not present

## 2022-10-31 DIAGNOSIS — M6281 Muscle weakness (generalized): Secondary | ICD-10-CM | POA: Diagnosis not present

## 2022-10-31 DIAGNOSIS — I471 Supraventricular tachycardia, unspecified: Secondary | ICD-10-CM | POA: Diagnosis not present

## 2022-11-01 DIAGNOSIS — G4700 Insomnia, unspecified: Secondary | ICD-10-CM | POA: Diagnosis not present

## 2022-11-01 DIAGNOSIS — M6281 Muscle weakness (generalized): Secondary | ICD-10-CM | POA: Diagnosis not present

## 2022-11-01 DIAGNOSIS — F330 Major depressive disorder, recurrent, mild: Secondary | ICD-10-CM | POA: Diagnosis not present

## 2022-11-01 DIAGNOSIS — I471 Supraventricular tachycardia, unspecified: Secondary | ICD-10-CM | POA: Diagnosis not present

## 2022-11-06 DIAGNOSIS — I471 Supraventricular tachycardia, unspecified: Secondary | ICD-10-CM | POA: Diagnosis not present

## 2022-11-06 DIAGNOSIS — M6281 Muscle weakness (generalized): Secondary | ICD-10-CM | POA: Diagnosis not present

## 2022-11-07 DIAGNOSIS — I471 Supraventricular tachycardia, unspecified: Secondary | ICD-10-CM | POA: Diagnosis not present

## 2022-11-07 DIAGNOSIS — M6281 Muscle weakness (generalized): Secondary | ICD-10-CM | POA: Diagnosis not present

## 2022-11-09 DIAGNOSIS — M6281 Muscle weakness (generalized): Secondary | ICD-10-CM | POA: Diagnosis not present

## 2022-11-09 DIAGNOSIS — R2681 Unsteadiness on feet: Secondary | ICD-10-CM | POA: Diagnosis not present

## 2022-11-13 DIAGNOSIS — M6281 Muscle weakness (generalized): Secondary | ICD-10-CM | POA: Diagnosis not present

## 2022-11-13 DIAGNOSIS — R2681 Unsteadiness on feet: Secondary | ICD-10-CM | POA: Diagnosis not present

## 2022-11-15 DIAGNOSIS — R2681 Unsteadiness on feet: Secondary | ICD-10-CM | POA: Diagnosis not present

## 2022-11-15 DIAGNOSIS — M6281 Muscle weakness (generalized): Secondary | ICD-10-CM | POA: Diagnosis not present

## 2022-11-16 ENCOUNTER — Encounter: Payer: Self-pay | Admitting: Family Medicine

## 2022-11-18 DIAGNOSIS — R2681 Unsteadiness on feet: Secondary | ICD-10-CM | POA: Diagnosis not present

## 2022-11-18 DIAGNOSIS — M6281 Muscle weakness (generalized): Secondary | ICD-10-CM | POA: Diagnosis not present

## 2022-11-19 ENCOUNTER — Encounter: Payer: Self-pay | Admitting: Family Medicine

## 2022-11-20 DIAGNOSIS — M6281 Muscle weakness (generalized): Secondary | ICD-10-CM | POA: Diagnosis not present

## 2022-11-20 DIAGNOSIS — R2681 Unsteadiness on feet: Secondary | ICD-10-CM | POA: Diagnosis not present

## 2022-11-22 DIAGNOSIS — M6281 Muscle weakness (generalized): Secondary | ICD-10-CM | POA: Diagnosis not present

## 2022-11-22 DIAGNOSIS — R2681 Unsteadiness on feet: Secondary | ICD-10-CM | POA: Diagnosis not present

## 2022-11-26 DIAGNOSIS — M6281 Muscle weakness (generalized): Secondary | ICD-10-CM | POA: Diagnosis not present

## 2022-11-26 DIAGNOSIS — R2681 Unsteadiness on feet: Secondary | ICD-10-CM | POA: Diagnosis not present

## 2022-11-28 DIAGNOSIS — R2681 Unsteadiness on feet: Secondary | ICD-10-CM | POA: Diagnosis not present

## 2022-11-28 DIAGNOSIS — M6281 Muscle weakness (generalized): Secondary | ICD-10-CM | POA: Diagnosis not present

## 2022-11-30 DIAGNOSIS — R2681 Unsteadiness on feet: Secondary | ICD-10-CM | POA: Diagnosis not present

## 2022-11-30 DIAGNOSIS — G4700 Insomnia, unspecified: Secondary | ICD-10-CM | POA: Diagnosis not present

## 2022-11-30 DIAGNOSIS — F330 Major depressive disorder, recurrent, mild: Secondary | ICD-10-CM | POA: Diagnosis not present

## 2022-11-30 DIAGNOSIS — M6281 Muscle weakness (generalized): Secondary | ICD-10-CM | POA: Diagnosis not present

## 2022-12-03 DIAGNOSIS — M6281 Muscle weakness (generalized): Secondary | ICD-10-CM | POA: Diagnosis not present

## 2022-12-03 DIAGNOSIS — R2681 Unsteadiness on feet: Secondary | ICD-10-CM | POA: Diagnosis not present

## 2022-12-04 DIAGNOSIS — M6281 Muscle weakness (generalized): Secondary | ICD-10-CM | POA: Diagnosis not present

## 2022-12-04 DIAGNOSIS — R2681 Unsteadiness on feet: Secondary | ICD-10-CM | POA: Diagnosis not present

## 2022-12-06 ENCOUNTER — Encounter: Payer: Self-pay | Admitting: Sports Medicine

## 2022-12-06 ENCOUNTER — Non-Acute Institutional Stay (SKILLED_NURSING_FACILITY): Payer: Medicare Other | Admitting: Sports Medicine

## 2022-12-06 DIAGNOSIS — I4892 Unspecified atrial flutter: Secondary | ICD-10-CM | POA: Diagnosis not present

## 2022-12-06 DIAGNOSIS — Z9181 History of falling: Secondary | ICD-10-CM | POA: Diagnosis not present

## 2022-12-06 DIAGNOSIS — Z8673 Personal history of transient ischemic attack (TIA), and cerebral infarction without residual deficits: Secondary | ICD-10-CM

## 2022-12-06 DIAGNOSIS — Z66 Do not resuscitate: Secondary | ICD-10-CM

## 2022-12-06 DIAGNOSIS — I471 Supraventricular tachycardia, unspecified: Secondary | ICD-10-CM | POA: Diagnosis not present

## 2022-12-06 DIAGNOSIS — F0390 Unspecified dementia without behavioral disturbance: Secondary | ICD-10-CM

## 2022-12-06 DIAGNOSIS — K219 Gastro-esophageal reflux disease without esophagitis: Secondary | ICD-10-CM

## 2022-12-06 NOTE — Progress Notes (Signed)
Location:  [facility] Room Number: N040-A Place of Service:  SNF [phone]) Provider:  Willey Blade, MD     Patient Care Team: Mahlon Gammon, MD as PCP - General (Internal Medicine)  Extended Emergency Contact Information Primary Emergency Contact: [name],[name] Mobile Phone: [phone] Relation: Nephew Secondary Emergency Contact: [name],[name] Mobile Phone: [phone] Relation: Friend  Code Status:  DNR Goals of care: Advanced Directive information    12/06/2022   10:03 AM  Advanced Directives  Does Patient Have a Medical Advance Directive? Yes  Type of Estate agent of [place];Out of facility DNR (pink MOST or yellow form);Living will  Does patient want to make changes to medical advance directive? No - Patient declined  Copy of Healthcare Power of Attorney in Chart? Yes - validated most recent copy scanned in chart (See row information)  Pre-existing out of facility DNR order (yellow form or pink MOST form) Yellow form placed in chart (order not valid for inpatient use);Pink MOST form placed in chart (order not valid for inpatient use)     Chief Complaint  Patient presents with   Medical Management of Chronic Issues    Routine visit , Discuss Covid vaccine, Flu Vaccine.    HPI:  Pt is a 89 y.o. female seen today for medical management of chronic diseases.    Pt seen and examined in her room Lying comfortably in her bed She knows her name, cannot remember what she ate for breakfast  Denies pain  Denies cough, SOB, abdominal pain, nausea, dysuria. She has a soft toy in her bed and says that her baby .  Spoke with the RN, they have no concerns. Pt is high risk for falling, she is working with PT. She is able to ambulate with the help of walker. 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 No pertinent surgical history.  Allergies  Allergen Reactions   Alphagan P [Brimonidine Tartrate] Other (See Comments)    Unknown reaction Listed on MAR   Azopt [Brinzolamide] Other (See Comments)    Unknown reaction Listed on MAR   Lumigan [Bimatoprost] Other (See Comments)    Unknown reaction Listed on [facility]    Outpatient Encounter Medications as of 12/06/2022  Medication Sig   apixaban (ELIQUIS) 2.5 MG TABS tablet Take 1 tablet (2.5 mg total) by mouth 2 (two) times daily.   atorvastatin (LIPITOR) 10 MG tablet Take 5 mg by mouth daily.   B Complex-Folic Acid (B COMPLEX VITAMINS, W/ FA, PO) Take 1 tablet by mouth daily. Vitamin B complex - Folic Acid 0.4 mg   Calcium Carbonate-Vitamin D 600-400 MG-UNIT tablet Take 1 tablet by mouth daily.   lactose free nutrition (BOOST) LIQD Take 237 mLs by mouth daily.   melatonin 3 MG TABS tablet Take 3 mg by mouth at bedtime.   metoprolol tartrate (LOPRESSOR) 25 MG tablet Take 12.5 mg by mouth as needed.  for SVT HOLD IF HR < 50   metoprolol tartrate (LOPRESSOR) 25 MG tablet  Take 12.5 mg by mouth 2 (two) times daily. for SVT HOLD IF BP <90/60, HR<50   MULTIPLE VITAMIN-FOLIC ACID PO Take 1 tablet by mouth daily.   pantoprazole (PROTONIX) 40 MG tablet Take 40 mg by mouth daily. DAW   Sodium Fluoride (PREVIDENT 5000 BOOSTER PLUS) 1.1 % PSTE Place 1 application. onto teeth at bedtime.   timolol (TIMOPTIC) 0.5 % ophthalmic solution Place 1 drop into both eyes daily.   No facility-administered encounter medications on file as of 12/06/2022.    Review of Systems  Unable to perform ROS: Dementia  Constitutional:  Negative for chills.  HENT:   Negative for postnasal drip and sore throat.   Respiratory:  Negative for cough.   Gastrointestinal:  Negative for abdominal pain, blood in stool, nausea and vomiting.  Genitourinary:  Negative for dysuria and hematuria.  Neurological:  Negative for dizziness.  Psychiatric/Behavioral:  Negative for agitation and behavioral problems.     Immunization History  Administered Date(s) Administered   DTaP 09/05/2004   Influenza, High Dose Seasonal PF 01/06/2014, 11/08/2018   Influenza, Quadrivalent, Recombinant, Inj, Pf 01/09/2018, 01/21/2019   Influenza-Unspecified 11/07/2013, 01/14/2020, 01/26/2021, 01/31/2022   Moderna Covid-19 Vaccine Bivalent Booster 105yrs & up 04/04/2021   Moderna SARS-COV2 Booster Vaccination 02/08/2022   Moderna Sars-Covid-2 Vaccination 05/09/2019, 06/06/2019, 02/22/2020, 09/06/2020   Pneumococcal Conjugate-13 01/06/2014   Pneumococcal Polysaccharide-23 09/05/2004   Pneumococcal-Unspecified 09/05/2004, 10/18/2014   Tdap 07/19/2022   Zoster Recombinant(Shingrix) 03/21/2021, 05/24/2021   Pertinent  Health Maintenance Due  Topic Date Due   INFLUENZA VACCINE  11/08/2022   DEXA SCAN  Completed      08/26/2021    8:30 PM 08/27/2021   10:00 AM 01/09/2022   12:36 PM 06/25/2022   12:28 PM 07/19/2022   10:41 AM  Fall Risk  Falls in the past year?   0 0 0  Was there an injury with Fall?   0 0 0  Fall Risk Category Calculator   0 0 0  Fall Risk Category (Retired)   Low    (RETIRED) Patient Fall Risk Level High fall risk High fall risk Low fall risk    Patient at Risk for Falls Due to   No Fall Risks No Fall Risks No Fall Risks  Fall risk Follow up   Falls evaluation completed Falls evaluation completed Falls evaluation completed   Functional Status Survey:    Vitals:   12/06/22 0959  BP: 115/72  Pulse: 68  Resp: 14  Temp: (!) 97.5 F (36.4 C)  SpO2: 97%  Weight: 98 lb 11.2 oz (44.8 kg)  Height: 4\' 10"  (1.473 m)   Body mass index is 20.63 kg/m. Physical  Exam Constitutional:      Appearance: Normal appearance.  Cardiovascular:     Rate and Rhythm: Normal rate and regular rhythm.  Pulmonary:     Effort: Pulmonary effort is normal.     Breath sounds: Normal breath sounds.  Abdominal:     General: There is no distension.     Tenderness: There is no guarding or rebound.  Musculoskeletal:        General: No swelling.  Skin:    General: Skin is warm and dry.  Neurological:     Mental Status: She is alert. Mental status is at baseline.     Sensory: No sensory deficit.     Motor: No weakness.     Labs reviewed: Recent Labs    06/12/22 0630  NA 141  K 4.3  CL 104  CO2 33*  BUN 22*  CREATININE 1.0  CALCIUM 8.8   Recent Labs    06/12/22 0630  AST 23  ALT 15  ALKPHOS 76  ALBUMIN 3.7   Recent Labs    06/12/22 0630  WBC 5.3  NEUTROABS 3,593.00  HGB 14.9  HCT 45  PLT 221   Lab Results  Component Value Date   TSH 3.96 06/12/2022   Lab Results  Component Value Date   HGBA1C 5.9 (H) 11/04/2019   Lab Results  Component Value Date   CHOL 110 09/19/2021   HDL 55 09/19/2021   LDLCALC 41 09/19/2021   TRIG 56 09/19/2021   CHOLHDL 2.8 11/06/2019    Significant Diagnostic Results in last 30 days:  No results found.  Assessment/Plan  1. Senile dementia (HCC) Cont with supportive care Increase physical activity and cognitively engaging activities No behavioral issues noted  2. Paroxysmal SVT (supraventricular tachycardia) Rate controlled Cont with metoprolol  3. Gastroesophageal reflux disease, unspecified whether esophagitis present No signs of bleeding Cont with protonix  4. Paroxysmal atrial flutter (HCC) Rate controlled Cont with eliquis 2.5 mg bid  5. History of stroke Cont with lipitor  6. At risk for falls Cont with PT Gym mat at bedside Instructed patient to use walker all the time  Cont with calcium, vit D   Other orders - lactose free nutrition (BOOST) LIQD; Take 237 mLs by mouth  daily. - metoprolol tartrate (LOPRESSOR) 25 MG tablet; Take 12.5 mg by mouth 2 (two) times daily. for SVT HOLD IF BP <90/60, HR<50  Dr Jacquenette Shone  Medical director  [facility]

## 2022-12-07 ENCOUNTER — Encounter: Payer: Self-pay | Admitting: Sports Medicine

## 2022-12-07 DIAGNOSIS — R2681 Unsteadiness on feet: Secondary | ICD-10-CM | POA: Diagnosis not present

## 2022-12-07 DIAGNOSIS — M6281 Muscle weakness (generalized): Secondary | ICD-10-CM | POA: Diagnosis not present

## 2022-12-11 DIAGNOSIS — Z515 Encounter for palliative care: Secondary | ICD-10-CM | POA: Diagnosis not present

## 2022-12-11 DIAGNOSIS — R636 Underweight: Secondary | ICD-10-CM | POA: Diagnosis not present

## 2022-12-11 DIAGNOSIS — F03C0 Unspecified dementia, severe, without behavioral disturbance, psychotic disturbance, mood disturbance, and anxiety: Secondary | ICD-10-CM | POA: Diagnosis not present

## 2022-12-18 ENCOUNTER — Non-Acute Institutional Stay (SKILLED_NURSING_FACILITY): Payer: Self-pay | Admitting: Nurse Practitioner

## 2022-12-18 ENCOUNTER — Encounter: Payer: Self-pay | Admitting: Nurse Practitioner

## 2022-12-18 DIAGNOSIS — F0390 Unspecified dementia without behavioral disturbance: Secondary | ICD-10-CM

## 2022-12-18 DIAGNOSIS — D509 Iron deficiency anemia, unspecified: Secondary | ICD-10-CM

## 2022-12-18 DIAGNOSIS — K219 Gastro-esophageal reflux disease without esophagitis: Secondary | ICD-10-CM | POA: Diagnosis not present

## 2022-12-18 DIAGNOSIS — F339 Major depressive disorder, recurrent, unspecified: Secondary | ICD-10-CM | POA: Diagnosis not present

## 2022-12-18 DIAGNOSIS — J439 Emphysema, unspecified: Secondary | ICD-10-CM | POA: Diagnosis not present

## 2022-12-18 DIAGNOSIS — N1831 Chronic kidney disease, stage 3a: Secondary | ICD-10-CM | POA: Diagnosis not present

## 2022-12-18 DIAGNOSIS — I639 Cerebral infarction, unspecified: Secondary | ICD-10-CM | POA: Diagnosis not present

## 2022-12-18 DIAGNOSIS — E782 Mixed hyperlipidemia: Secondary | ICD-10-CM | POA: Diagnosis not present

## 2022-12-18 DIAGNOSIS — I471 Supraventricular tachycardia, unspecified: Secondary | ICD-10-CM | POA: Diagnosis not present

## 2022-12-18 DIAGNOSIS — R609 Edema, unspecified: Secondary | ICD-10-CM | POA: Diagnosis not present

## 2022-12-18 NOTE — Assessment & Plan Note (Signed)
stable, off Sertraline.  

## 2022-12-18 NOTE — Assessment & Plan Note (Signed)
not apparent, off Torsemide. BNP 798 08/26/21, echo 08/27/21 EF 60-65%, weight stable.

## 2022-12-18 NOTE — Assessment & Plan Note (Signed)
off Amiodarone 01/2020 in hospital due to prolonged QT interval. On Metoprolol bid and prn, on Eliquis 

## 2022-12-18 NOTE — Assessment & Plan Note (Signed)
Stable

## 2022-12-18 NOTE — Assessment & Plan Note (Signed)
11/02/19 CVA (MRI cevebral small acute infarcts in the posterior left hemisphere in the left PCA and MCA watershed area. Takes Eliquis.

## 2022-12-18 NOTE — Assessment & Plan Note (Signed)
lower back, aches, ambulates with walker.

## 2022-12-18 NOTE — Assessment & Plan Note (Signed)
Senile dementia since CVA, no behavioral issues. MMSE 15/30 03/20/21 

## 2022-12-18 NOTE — Assessment & Plan Note (Signed)
Bun/creat 22/1.0 06/12/22 

## 2022-12-18 NOTE — Progress Notes (Unsigned)
Location:  [facility] Room Number: N040-A Place of Service:  SNF (31) Provider:  Chayton Murata X, NP    Patient Care Team: Mahlon Gammon, MD as PCP - General (Internal Medicine)  Extended Emergency Contact Information Primary Emergency Contact: [name],[name] Mobile Phone: [phone] Relation: Nephew Secondary Emergency Contact: [name],[name] Mobile Phone: [phone] Relation: Friend  Code Status:  DNR Goals of care: Advanced Directive information    12/18/2022   10:01 AM  Advanced Directives  Does Patient Have a Medical Advance Directive? Yes  Type of Estate agent of [place];Out of facility DNR (pink MOST or yellow form);Living will  Does patient want to make changes to medical advance directive? No - Patient declined  Copy of Healthcare Power of Attorney in Chart? Yes - validated most recent copy scanned in chart (See row information)  Pre-existing out of facility DNR order (yellow form or pink MOST form) Yellow form placed in chart (order not valid for inpatient use);Pink MOST form placed in chart (order not valid for inpatient use)     Chief Complaint  Patient presents with   Medical Management of Chronic Issues    Routine Visit    Immunizations    Influenza and Covid    HPI:  Pt is a 89 y.o. female seen today for medical management of chronic diseases.     Hx of emphysema, stable.  Edema, not apparent, off Torsemide. BNP 798 08/26/21, echo 08/27/21 EF 60-65%, weight stable.              PSVT off Amiodarone 01/2020 in hospital due to prolonged QT interval. On Metoprolol bid and prn, on Eliquis             GERD, no further c/o abd discomfort or pain since PPI switched from Omeprazole to Pantoprazole 10/05/22. Hgb 14.9 06/12/22             Depression, stable, off Sertraline.              CKD Bun/creat 22/1.0 06/12/22             Senile dementia since CVA, no behavioral issues. MMSE 15/30 03/20/21             11/02/19 CVA (MRI  cevebral small acute infarcts in the posterior left hemisphere in the left PCA and MCA watershed area. Takes Eliquis.              Anemia, 01/21/20 Iron 44, ferritin 63, Vit B12 695, Hgb 14.9 06/12/22             Hyperlipidemia, takes Atorvastatin, LDL 41 09/19/21             OA lower back, aches, ambulates with walker.      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 No pertinent surgical history.  Allergies  Allergen Reactions   Alphagan P [Brimonidine Tartrate] Other (See Comments)    Unknown reaction Listed on MAR   Azopt [Brinzolamide] Other (See Comments)    Unknown reaction Listed on MAR   Lumigan [Bimatoprost] Other (See Comments)    Unknown reaction Listed on [facility]    Outpatient  Encounter Medications as of 12/18/2022  Medication Sig   apixaban (ELIQUIS) 2.5 MG TABS tablet Take 1 tablet (2.5 mg total) by mouth 2 (two) times daily.   atorvastatin (LIPITOR) 10 MG tablet Take 5 mg by mouth daily.   B Complex-Folic Acid (B COMPLEX VITAMINS, W/ FA, PO) Take 1 tablet by mouth daily. Vitamin B complex - Folic Acid 0.4 mg   Calcium Carbonate-Vitamin D 600-400 MG-UNIT tablet Take 1 tablet by mouth daily.   lactose free nutrition (BOOST) LIQD Take 237 mLs by mouth daily.   melatonin 3 MG TABS tablet Take 3 mg by mouth at bedtime.   metoprolol tartrate (LOPRESSOR) 25 MG tablet Take 12.5 mg by mouth as needed.  for SVT HOLD IF HR < 50   metoprolol tartrate (LOPRESSOR) 25 MG tablet Take 12.5 mg by mouth 2 (two) times daily. for SVT HOLD IF BP <90/60, HR<50   MULTIPLE VITAMIN-FOLIC ACID PO Take 1 tablet by mouth daily.   pantoprazole (PROTONIX) 40 MG tablet Take 40 mg by mouth daily.  DAW   Sodium Fluoride (PREVIDENT 5000 BOOSTER PLUS) 1.1 % PSTE Place 1 application. onto teeth at bedtime.   timolol (TIMOPTIC) 0.5 % ophthalmic solution Place 1 drop into both eyes daily.   No facility-administered encounter medications on file as of 12/18/2022.    Review of Systems  Unable to perform ROS: Dementia    Immunization History  Administered Date(s) Administered   DTaP 09/05/2004   Influenza, High Dose Seasonal PF 01/06/2014, 11/08/2018   Influenza, Quadrivalent, Recombinant, Inj, Pf 01/09/2018, 01/21/2019   Influenza-Unspecified 11/07/2013, 01/14/2020, 01/26/2021, 01/31/2022   Moderna Covid-19 Vaccine Bivalent Booster 82yrs & up 04/04/2021   Moderna SARS-COV2 Booster Vaccination 02/08/2022   Moderna Sars-Covid-2 Vaccination 05/09/2019, 06/06/2019, 02/22/2020, 09/06/2020   Pneumococcal Conjugate-13 01/06/2014   Pneumococcal Polysaccharide-23 09/05/2004   Pneumococcal-Unspecified 09/05/2004, 10/18/2014   Tdap 07/19/2022   Zoster Recombinant(Shingrix) 03/21/2021, 05/24/2021   Pertinent  Health Maintenance Due  Topic Date Due   INFLUENZA VACCINE  11/08/2022   DEXA SCAN  Completed      08/26/2021    8:30 PM 08/27/2021   10:00 AM 01/09/2022   12:36 PM 06/25/2022   12:28 PM 07/19/2022   10:41 AM  Fall Risk  Falls in the past year?   0 0 0  Was there an injury with Fall?   0 0 0  Fall Risk Category Calculator   0 0 0  Fall Risk Category (Retired)   Low    (RETIRED) Patient Fall Risk Level High fall risk High fall risk Low fall risk    Patient at Risk for Falls Due to   No Fall Risks No Fall Risks No Fall Risks  Fall risk Follow up   Falls evaluation completed Falls evaluation completed Falls evaluation completed   Functional Status Survey:    Vitals:   12/18/22 1000  BP: 118/62  Pulse: 60  Resp: 20  Temp: (!) 97.2 F (36.2 C)  SpO2: 93%  Weight: 98 lb (44.5 kg)  Height: 4\' 10"  (1.473 m)   Body mass index is 20.48 kg/m. Physical Exam Vitals and nursing  note reviewed.  Constitutional:      Appearance: Normal appearance.  HENT:     Head: Normocephalic and atraumatic.     Nose: Nose normal.     Mouth/Throat:     Mouth: Mucous membranes are moist.  Eyes:     Extraocular Movements: Extraocular movements intact.     Conjunctiva/sclera: Conjunctivae normal.  Pupils: Pupils are equal, round, and reactive to light.  Cardiovascular:     Rate and Rhythm: Normal rate and regular rhythm.     Heart sounds: No murmur heard. Pulmonary:     Effort: Pulmonary effort is normal.     Breath sounds: No rales.  Abdominal:     General: Bowel sounds are normal.     Palpations: Abdomen is soft.     Tenderness: There is no abdominal tenderness.  Musculoskeletal:     Cervical back: Normal range of motion and neck supple.     Right lower leg: No edema.     Left lower leg: No edema.  Skin:    General: Skin is warm and dry.     Comments: Hammer toes most of her toes Yellow, thick toe nails.   Neurological:     General: No focal deficit present.     Mental Status: She is alert and oriented to person, place, and time. Mental status is at baseline.     Gait: Gait abnormal.     Comments: Ambulates with walker.   Psychiatric:        Mood and Affect: Mood normal.        Behavior: Behavior normal.        Thought Content: Thought content normal.     Labs reviewed: Recent Labs    06/12/22 0630  NA 141  K 4.3  CL 104  CO2 33*  BUN 22*  CREATININE 1.0  CALCIUM 8.8   Recent Labs    06/12/22 0630  AST 23  ALT 15  ALKPHOS 76  ALBUMIN 3.7   Recent Labs    06/12/22 0630  WBC 5.3  NEUTROABS 3,593.00  HGB 14.9  HCT 45  PLT 221   Lab Results  Component Value Date   TSH 3.96 06/12/2022   Lab Results  Component Value Date   HGBA1C 5.9 (H) 11/04/2019   Lab Results  Component Value Date   CHOL 110 09/19/2021   HDL 55 09/19/2021   LDLCALC 41 09/19/2021   TRIG 56 09/19/2021   CHOLHDL 2.8 11/06/2019    Significant Diagnostic  Results in last 30 days:  No results found.  Assessment/Plan There are no diagnoses linked to this encounter.   Family/ staff Communication: ***  Labs/tests ordered:  ***

## 2022-12-18 NOTE — Assessment & Plan Note (Signed)
no further c/o abd discomfort or pain since PPI switched from Omeprazole to Pantoprazole 10/05/22. Hgb 14.9 06/12/22

## 2022-12-18 NOTE — Assessment & Plan Note (Signed)
01/21/20 Iron 44, ferritin 63, Vit B12 695, Hgb 14.9 06/12/22 

## 2022-12-18 NOTE — Assessment & Plan Note (Signed)
takes Atorvastatin, LDL 41 09/19/21 

## 2023-01-04 DIAGNOSIS — G4700 Insomnia, unspecified: Secondary | ICD-10-CM | POA: Diagnosis not present

## 2023-01-04 DIAGNOSIS — F330 Major depressive disorder, recurrent, mild: Secondary | ICD-10-CM | POA: Diagnosis not present

## 2023-01-07 ENCOUNTER — Non-Acute Institutional Stay (SKILLED_NURSING_FACILITY): Payer: Self-pay | Admitting: Nurse Practitioner

## 2023-01-07 ENCOUNTER — Encounter: Payer: Self-pay | Admitting: Nurse Practitioner

## 2023-01-07 DIAGNOSIS — I471 Supraventricular tachycardia, unspecified: Secondary | ICD-10-CM

## 2023-01-07 DIAGNOSIS — M159 Polyosteoarthritis, unspecified: Secondary | ICD-10-CM

## 2023-01-07 DIAGNOSIS — K219 Gastro-esophageal reflux disease without esophagitis: Secondary | ICD-10-CM | POA: Diagnosis not present

## 2023-01-07 DIAGNOSIS — I639 Cerebral infarction, unspecified: Secondary | ICD-10-CM | POA: Diagnosis not present

## 2023-01-07 DIAGNOSIS — N1831 Chronic kidney disease, stage 3a: Secondary | ICD-10-CM | POA: Diagnosis not present

## 2023-01-07 DIAGNOSIS — M150 Primary generalized (osteo)arthritis: Secondary | ICD-10-CM

## 2023-01-07 DIAGNOSIS — F0390 Unspecified dementia without behavioral disturbance: Secondary | ICD-10-CM

## 2023-01-07 NOTE — Assessment & Plan Note (Signed)
no further c/o abd discomfort or pain since PPI switched from Omeprazole to Pantoprazole 10/05/22. Hgb 14.9 06/12/22

## 2023-01-07 NOTE — Assessment & Plan Note (Signed)
Stable

## 2023-01-07 NOTE — Assessment & Plan Note (Signed)
lower back, aches, ambulates with walker.

## 2023-01-07 NOTE — Assessment & Plan Note (Signed)
Senile dementia since CVA, no behavioral issues. MMSE 15/30 03/20/21 

## 2023-01-07 NOTE — Progress Notes (Unsigned)
Location:   [facility] Room Number: 40A Place of Service:  SNF (31) Provider: [facility] Sajid Ruppert NP  Mahlon Gammon, MD  Patient Care Team: Mahlon Gammon, MD as PCP - General (Internal Medicine)  Extended Emergency Contact Information Primary Emergency Contact: [name],[name] Mobile Phone: [phone] Relation: Nephew Secondary Emergency Contact: [name],[name] Mobile Phone: [phone] Relation: Friend  Code Status:  DNR Goals of care: Advanced Directive information    12/18/2022   10:01 AM  Advanced Directives  Does Patient Have a Medical Advance Directive? Yes  Type of Estate agent of [place];Out of facility DNR (pink MOST or yellow form);Living will  Does patient want to make changes to medical advance directive? No - Patient declined  Copy of Healthcare Power of Attorney in Chart? Yes - validated most recent copy scanned in chart (See row information)  Pre-existing out of facility DNR order (yellow form or pink MOST form) Yellow form placed in chart (order not valid for inpatient use);Pink MOST form placed in chart (order not valid for inpatient use)     Chief Complaint  Patient presents with  . Medical Management of Chronic Issues    HPI:  Pt is a 89 y.o. female seen today for medical management of chronic diseases.     Hx of emphysema, stable.  Edema, not apparent, off Torsemide. BNP 798 08/26/21, echo 08/27/21 EF 60-65%, weight stable.              PSVT off Amiodarone 01/2020 in hospital due to prolonged QT interval. On Metoprolol bid and prn, on Eliquis             GERD, no further c/o abd discomfort or pain since PPI switched from Omeprazole to Pantoprazole 10/05/22. Hgb 14.9 06/12/22             Depression, stable, off Sertraline.              CKD Bun/creat 22/1.0 06/12/22             Senile dementia since CVA, no behavioral issues. MMSE 15/30 03/20/21             11/02/19 CVA (MRI cevebral small acute infarcts in the posterior left  hemisphere in the left PCA and MCA watershed area. Takes Eliquis.              Anemia, 01/21/20 Iron 44, ferritin 63, Vit B12 695, Hgb 14.9 06/12/22             Hyperlipidemia, takes Atorvastatin, LDL 41 09/19/21             OA lower back, aches, ambulates with walker.      Past Medical History:  Diagnosis Date  . Atrial flutter, paroxysmal (HCC)   . COVID-19   . Dementia (HCC)   . Diastolic dysfunction   . Hyperlipidemia   . Hypertension   . PSVT (paroxysmal supraventricular tachycardia)   . Stroke (HCC)   . 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 No pertinent surgical history.  Allergies  Allergen Reactions  . Alphagan P [Brimonidine Tartrate] Other (See Comments)    Unknown reaction Listed on MAR  . Azopt [Brinzolamide] Other (See Comments)    Unknown reaction Listed on MAR  . Lumigan [Bimatoprost] Other (See Comments)    Unknown reaction Listed on MAR    Allergies as of 01/07/2023       Reactions   Alphagan P [  brimonidine Tartrate] Other (See Comments)   Unknown reaction Listed on MAR   Azopt [brinzolamide] Other (See Comments)   Unknown reaction Listed on MAR   Lumigan [bimatoprost] Other (See Comments)   Unknown reaction Listed on [facility]        Medication List        Accurate as of January 07, 2023 11:59 PM. If you have any questions, ask your nurse or doctor.          apixaban 2.5 MG Tabs tablet Commonly known as: ELIQUIS Take 1 tablet (2.5 mg total) by mouth 2 (two) times daily.   atorvastatin 10 MG tablet Commonly known as: LIPITOR Take 5 mg by mouth daily.   B COMPLEX VITAMINS (W/ FA) PO Take 1 tablet by mouth daily. Vitamin B complex - Folic Acid 0.4 mg   Calcium Carbonate-Vitamin D 600-400 MG-UNIT tablet Take 1 tablet by mouth daily.   lactose free nutrition Liqd Take 237 mLs by mouth daily.   melatonin 3 MG Tabs tablet Take 3 mg by mouth at bedtime.    metoprolol tartrate 25 MG tablet Commonly known as: LOPRESSOR Take 12.5 mg by mouth as needed.  for SVT HOLD IF HR < 50   metoprolol tartrate 25 MG tablet Commonly known as: LOPRESSOR Take 12.5 mg by mouth 2 (two) times daily. for SVT HOLD IF BP <90/60, HR<50   MULTIPLE VITAMIN-FOLIC ACID PO Take 1 tablet by mouth daily.   pantoprazole 40 MG tablet Commonly known as: PROTONIX Take 40 mg by mouth daily. DAW   PreviDent 5000 Booster Plus 1.1 % Pste Generic drug: Sodium Fluoride Place 1 application. onto teeth at bedtime.   timolol 0.5 % ophthalmic solution Commonly known as: TIMOPTIC Place 1 drop into both eyes daily.        Review of Systems  Unable to perform ROS: Dementia    Immunization History  Administered Date(s) Administered  . DTaP 09/05/2004  . Influenza, High Dose Seasonal PF 01/06/2014, 11/08/2018  . Influenza, Quadrivalent, Recombinant, Inj, Pf 01/09/2018, 01/21/2019  . Influenza-Unspecified 11/07/2013, 01/14/2020, 01/26/2021, 01/31/2022  . Moderna Covid-19 Vaccine Bivalent Booster 70yrs & up 04/04/2021  . Moderna SARS-COV2 Booster Vaccination 02/08/2022  . Moderna Sars-Covid-2 Vaccination 05/09/2019, 06/06/2019, 02/22/2020, 09/06/2020  . Pneumococcal Conjugate-13 01/06/2014  . Pneumococcal Polysaccharide-23 09/05/2004  . Pneumococcal-Unspecified 09/05/2004, 10/18/2014  . Tdap 07/19/2022  . Zoster Recombinant(Shingrix) 03/21/2021, 05/24/2021   Pertinent  Health Maintenance Due  Topic Date Due  . INFLUENZA VACCINE  11/08/2022  . DEXA SCAN  Completed      08/26/2021    8:30 PM 08/27/2021   10:00 AM 01/09/2022   12:36 PM 06/25/2022   12:28 PM 07/19/2022   10:41 AM  Fall Risk  Falls in the past year?   0 0 0  Was there an injury with Fall?   0 0 0  Fall Risk Category Calculator   0 0 0  Fall Risk Category (Retired)   Low    (RETIRED) Patient Fall Risk Level High fall risk High fall risk Low fall risk    Patient at Risk for Falls Due to   No Fall  Risks No Fall Risks No Fall Risks  Fall risk Follow up   Falls evaluation completed Falls evaluation completed Falls evaluation completed   Functional Status Survey:    Vitals:   01/07/23 1544  BP: (!) 101/54  Pulse: (!) 54  Resp: 16  Temp: (!) 97.4 F (36.3 C)  SpO2: 96%  Weight: 98 lb (  44.5 kg)   Body mass index is 20.48 kg/m. Physical Exam Vitals and nursing note reviewed.  Constitutional:      Appearance: Normal appearance.  HENT:     Head: Normocephalic and atraumatic.     Nose: Nose normal.     Mouth/Throat:     Mouth: Mucous membranes are moist.  Eyes:     Extraocular Movements: Extraocular movements intact.     Conjunctiva/sclera: Conjunctivae normal.     Pupils: Pupils are equal, round, and reactive to light.  Cardiovascular:     Rate and Rhythm: Regular rhythm. Bradycardia present.     Heart sounds: No murmur heard. Pulmonary:     Effort: Pulmonary effort is normal.     Breath sounds: No rales.  Abdominal:     General: Bowel sounds are normal.     Palpations: Abdomen is soft.     Tenderness: There is no abdominal tenderness.  Musculoskeletal:     Cervical back: Normal range of motion and neck supple.     Right lower leg: No edema.     Left lower leg: No edema.  Skin:    General: Skin is warm and dry.     Comments: Hammer toes most of her toes Yellow, thick toe nails.   Neurological:     General: No focal deficit present.     Mental Status: She is alert and oriented to person, place, and time. Mental status is at baseline.     Gait: Gait abnormal.     Comments: Ambulates with walker.   Psychiatric:        Mood and Affect: Mood normal.        Behavior: Behavior normal.        Thought Content: Thought content normal.    Labs reviewed: Recent Labs    06/12/22 0630  NA 141  K 4.3  CL 104  CO2 33*  BUN 22*  CREATININE 1.0  CALCIUM 8.8   Recent Labs    06/12/22 0630  AST 23  ALT 15  ALKPHOS 76  ALBUMIN 3.7   Recent Labs     06/12/22 0630  WBC 5.3  NEUTROABS 3,593.00  HGB 14.9  HCT 45  PLT 221   Lab Results  Component Value Date   TSH 3.96 06/12/2022   Lab Results  Component Value Date   HGBA1C 5.9 (H) 11/04/2019   Lab Results  Component Value Date   CHOL 110 09/19/2021   HDL 55 09/19/2021   LDLCALC 41 09/19/2021   TRIG 56 09/19/2021   CHOLHDL 2.8 11/06/2019    Significant Diagnostic Results in last 30 days:  No results found.  Assessment/Plan  Paroxysmal SVT (supraventricular tachycardia) (HCC)  off Amiodarone 01/2020 in hospital due to prolonged QT interval. On Metoprolol bid and prn, on Eliquis  GERD (gastroesophageal reflux disease) no further c/o abd discomfort or pain since PPI switched from Omeprazole to Pantoprazole 10/05/22. Hgb 14.9 06/12/22  CKD (chronic kidney disease) stage 3, GFR 30-59 ml/min (HCC) Bun/creat 22/1.0 06/12/22  Senile dementia (HCC)  Senile dementia since CVA, no behavioral issues. MMSE 15/30 03/20/21  Stroke (HCC) 11/02/19 CVA (MRI cevebral small acute infarcts in the posterior left hemisphere in the left PCA and MCA watershed area. Takes Eliquis.  Osteoarthritis, multiple sites  lower back, aches, ambulates with walker.   Emphysema of lung (HCC) Stable.    Family/ staff Communication: plan of care reviewed with the patient and charge nurse.   Labs/tests ordered:  none  Time  spend 25 minutes.

## 2023-01-07 NOTE — Assessment & Plan Note (Signed)
Bun/creat 22/1.0 06/12/22 

## 2023-01-07 NOTE — Assessment & Plan Note (Signed)
off Amiodarone 01/2020 in hospital due to prolonged QT interval. On Metoprolol bid and prn, on Eliquis 

## 2023-01-07 NOTE — Assessment & Plan Note (Signed)
11/02/19 CVA (MRI cevebral small acute infarcts in the posterior left hemisphere in the left PCA and MCA watershed area. Takes Eliquis.

## 2023-01-09 DIAGNOSIS — F03C0 Unspecified dementia, severe, without behavioral disturbance, psychotic disturbance, mood disturbance, and anxiety: Secondary | ICD-10-CM | POA: Diagnosis not present

## 2023-01-09 DIAGNOSIS — R636 Underweight: Secondary | ICD-10-CM | POA: Diagnosis not present

## 2023-01-09 DIAGNOSIS — Z515 Encounter for palliative care: Secondary | ICD-10-CM | POA: Diagnosis not present

## 2023-01-23 DIAGNOSIS — Z23 Encounter for immunization: Secondary | ICD-10-CM | POA: Diagnosis not present

## 2023-02-12 DIAGNOSIS — F03C0 Unspecified dementia, severe, without behavioral disturbance, psychotic disturbance, mood disturbance, and anxiety: Secondary | ICD-10-CM | POA: Diagnosis not present

## 2023-02-12 DIAGNOSIS — R636 Underweight: Secondary | ICD-10-CM | POA: Diagnosis not present

## 2023-02-12 DIAGNOSIS — Z515 Encounter for palliative care: Secondary | ICD-10-CM | POA: Diagnosis not present

## 2023-02-14 ENCOUNTER — Non-Acute Institutional Stay (SKILLED_NURSING_FACILITY): Payer: Medicare Other | Admitting: Sports Medicine

## 2023-02-14 ENCOUNTER — Encounter: Payer: Self-pay | Admitting: Sports Medicine

## 2023-02-14 DIAGNOSIS — I639 Cerebral infarction, unspecified: Secondary | ICD-10-CM

## 2023-02-14 DIAGNOSIS — F0390 Unspecified dementia without behavioral disturbance: Secondary | ICD-10-CM | POA: Diagnosis not present

## 2023-02-14 DIAGNOSIS — R634 Abnormal weight loss: Secondary | ICD-10-CM

## 2023-02-14 DIAGNOSIS — I4892 Unspecified atrial flutter: Secondary | ICD-10-CM

## 2023-02-14 DIAGNOSIS — I471 Supraventricular tachycardia, unspecified: Secondary | ICD-10-CM | POA: Diagnosis not present

## 2023-02-14 DIAGNOSIS — K219 Gastro-esophageal reflux disease without esophagitis: Secondary | ICD-10-CM | POA: Diagnosis not present

## 2023-02-14 NOTE — Progress Notes (Signed)
Provider:  Venita Sheffield MD Location:   [facility]   Place of Service:   Skilled care   PCP: Venita Sheffield, MD Patient Care Team: Venita Sheffield, MD as PCP - General (Internal Medicine)  Extended Emergency Contact Information Primary Emergency Contact: [name],[name] Mobile Phone: [phone] Relation: Nephew Secondary Emergency Contact: [name],[name] Mobile Phone: [phone] Relation: Friend  Code Status:  Goals of Care: Advanced Directive information    12/18/2022   10:01 AM  Advanced Directives  Does Patient Have a Medical Advance Directive? Yes  Type of Estate agent of [place];Out of facility DNR (pink MOST or yellow form);Living will  Does patient want to make changes to medical advance directive? No - Patient declined  Copy of Healthcare Power of Attorney in Chart? Yes - validated most recent copy scanned in chart (See row information)  Pre-existing out of facility DNR order (yellow form or pink MOST form) Yellow form placed in chart (order not valid for inpatient use);Pink MOST form placed in chart (order not valid for inpatient use)      No chief complaint on file.   HPI: Patient is a 89 y.o. female seen today for  routine visit for follow up on chronic medical problems Major Neurocognitive disorders Pt seen and examined in her room, she is laying comfortably in her bed She knows her name not oriented to time or place. She has a soft toy next to her, she says that her baby and hugs her. As per nursing staff pt has baseline confusion and able to voice her needs.  No behavioral manifestations She needs help with all her ADLS  Poor PO intake, staff encouraged her to increase fluid intake  She takes her med crushed in apple sauce   Weight loss No significant weight loss but continues to loose weight  Pt is on  regular/mechanical soft diet with boost supplements She needs cuing and limited assistance at  meals PRN. Consumes average of 75% of Boost            Paroxysmal SVT  echo 08/27/21 EF 60-65%  Off amiodarone due to prolonged Qtc On metoprolol and eliquis                       H/o CVA  MRI showed  11/02/19 CVA (MRI cevebral small acute infarcts in the posterior left hemisphere in the left PCA and MCA watershed area.   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 No pertinent surgical history.  reports that she has never smoked. She has never used smokeless tobacco. She reports that she does not drink alcohol and does not use drugs. Social History   Socioeconomic History   Marital status: Widowed    Spouse name: Not on file   Number of children: 0   Years of education: Not on file   Highest education level: Not on file  Occupational History   Not on file  Tobacco Use   Smoking status: Never   Smokeless tobacco: Never  Vaping Use   Vaping status: Never Used  Substance and Sexual Activity   Alcohol use: Never   Drug  use: Never   Sexual activity: Not on file  Other Topics Concern   Not on file  Social History Narrative   Not on file   Social Determinants of Health   Financial Resource Strain: Not on file  Food Insecurity: No Food Insecurity (08/16/2021)   Hunger Vital Sign    Worried About Running Out of Food in the Last Year: Never true    Ran Out of Food in the Last Year: Never true  Transportation Needs: Not on file  Physical Activity: Not on file  Stress: Not on file  Social Connections: Not on file  Intimate Partner Violence: Not on file    Functional Status Survey:    History reviewed. No pertinent family history.  Health Maintenance   Topic Date Due   INFLUENZA VACCINE  11/08/2022   COVID-19 Vaccine (7 - 2023-24 season) 12/09/2022   Medicare Annual Wellness (AWV)  07/19/2023   DTaP/Tdap/Td (3 - Td or Tdap) 07/18/2032   Pneumonia Vaccine 75+ Years old  Completed   DEXA SCAN  Completed   Zoster Vaccines- Shingrix  Completed   HPV VACCINES  Aged Out    Allergies  Allergen Reactions   Alphagan P [Brimonidine Tartrate] Other (See Comments)    Unknown reaction Listed on MAR   Azopt [Brinzolamide] Other (See Comments)    Unknown reaction Listed on MAR   Lumigan [Bimatoprost] Other (See Comments)    Unknown reaction Listed on [facility]    Outpatient Encounter Medications as of 02/14/2023  Medication Sig   apixaban (ELIQUIS) 2.5 MG TABS tablet Take 1 tablet (2.5 mg total) by mouth 2 (two) times daily.   atorvastatin (LIPITOR) 10 MG tablet Take 5 mg by mouth daily.   B Complex-Folic Acid (B COMPLEX VITAMINS, W/ FA, PO) Take 1 tablet by mouth daily. Vitamin B complex - Folic Acid 0.4 mg   Calcium Carbonate-Vitamin D 600-400 MG-UNIT tablet Take 1 tablet by mouth daily.   lactose free nutrition (BOOST) LIQD Take 237 mLs by mouth daily.   melatonin 3 MG TABS tablet Take 3 mg by mouth at bedtime.   metoprolol tartrate (LOPRESSOR) 25 MG tablet Take 12.5 mg by mouth as needed.  for SVT HOLD IF HR < 50   metoprolol tartrate (LOPRESSOR) 25 MG tablet Take 12.5 mg by mouth 2 (two) times daily. for SVT HOLD IF BP <90/60, HR<50   MULTIPLE VITAMIN-FOLIC ACID PO Take 1 tablet by mouth daily.   pantoprazole (PROTONIX) 40 MG tablet Take 40 mg by mouth daily. DAW   Sodium Fluoride (PREVIDENT 5000 BOOSTER PLUS) 1.1 % PSTE Place 1 application. onto teeth at bedtime.   timolol (TIMOPTIC) 0.5 % ophthalmic solution Place 1 drop into both eyes daily.   No facility-administered encounter medications on file as of 02/14/2023.    Review of Systems  Unable to perform ROS: Dementia  Constitutional:  Negative for fever.  Respiratory:  Negative for  cough.   Cardiovascular:  Negative for chest pain and leg swelling.  Gastrointestinal:  Negative for abdominal distention, abdominal pain, blood in stool, constipation, diarrhea, nausea and vomiting.  Genitourinary:  Negative for dysuria and urgency.  Psychiatric/Behavioral:  Positive for confusion. Negative for agitation and behavioral problems.     Vitals:   02/14/23 1109  BP: (!) 106/40  Pulse: 62  Resp: 18  Temp: 97.8 F (36.6 C)  SpO2: 98%  Weight: 97 lb 1.6 oz (44 kg)   Body mass index is 20.29 kg/m. Physical Exam Constitutional:  Appearance: Normal appearance.  HENT:     Head: Normocephalic and atraumatic.  Cardiovascular:     Rate and Rhythm: Normal rate and regular rhythm.  Pulmonary:     Effort: Pulmonary effort is normal. No respiratory distress.     Breath sounds: Normal breath sounds. No wheezing.  Abdominal:     General: Bowel sounds are normal. There is no distension.     Tenderness: There is no abdominal tenderness. There is no guarding or rebound.  Musculoskeletal:        General: No swelling or tenderness.  Skin:    General: Skin is dry.  Neurological:     Mental Status: She is alert. Mental status is at baseline.     Sensory: No sensory deficit.     Motor: No weakness.     Labs reviewed: Basic Metabolic Panel: Recent Labs    06/12/22 0630  NA 141  K 4.3  CL 104  CO2 33*  BUN 22*  CREATININE 1.0  CALCIUM 8.8   Liver Function Tests: Recent Labs    06/12/22 0630  AST 23  ALT 15  ALKPHOS 76  ALBUMIN 3.7   No results for input(s): "LIPASE", "AMYLASE" in the last 8760 hours. No results for input(s): "AMMONIA" in the last 8760 hours. CBC: Recent Labs    06/12/22 0630  WBC 5.3  NEUTROABS 3,593.00  HGB 14.9  HCT 45  PLT 221   Cardiac Enzymes: No results for input(s): "CKTOTAL", "CKMB", "CKMBINDEX", "TROPONINI" in the last 8760 hours. BNP: Invalid input(s): "POCBNP" Lab Results  Component Value Date   HGBA1C 5.9 (H)  11/04/2019   Lab Results  Component Value Date   TSH 3.96 06/12/2022   Lab Results  Component Value Date   VITAMINB12 695 01/21/2020   No results found for: "FOLATE" Lab Results  Component Value Date   IRON 44 01/21/2020   TIBC 273 01/21/2020   FERRITIN 63 01/21/2020    Imaging and Procedures obtained prior to SNF admission: ECHOCARDIOGRAM COMPLETE  Result Date: 08/27/2021    ECHOCARDIOGRAM REPORT   Patient Name:   MEYLI BOICE Date of Exam: 08/27/2021 Medical Rec #:  [id number]        Height:       58.0 in Accession #:    [id number]       Weight:       114.9 lb Date of Birth:  26-Mar-1923         BSA:          1.439 m Patient Age:    89 years         BP:           103/92 mmHg Patient Gender: F                HR:           124 bpm. Exam Location:  Inpatient Procedure: 2D Echo, Cardiac Doppler and Color Doppler Indications:    CHF  History:        Patient has prior history of Echocardiogram examinations, most                 recent 11/05/2019. Arrythmias:Tachycardia.  Sonographer:    Eduard Roux Referring Phys: [id number] RAVI PAHWANI IMPRESSIONS  1. Left ventricular ejection fraction, by estimation, is 60 to 65%. The left ventricle has normal function. The left ventricle has no regional wall motion abnormalities. Indeterminate diastolic filling due to E-A fusion.  2. Right ventricular systolic function is  moderately reduced. The right ventricular size is mildly enlarged. There is moderately elevated pulmonary artery systolic pressure.  3. Left atrial size was mildly dilated.  4. Right atrial size was mildly dilated.  5. The mitral valve is normal in structure. Trivial mitral valve regurgitation. No evidence of mitral stenosis.  6. Tricuspid valve regurgitation is severe.  7. The aortic valve is tricuspid. Aortic valve regurgitation is mild. No aortic stenosis is present.  8. The inferior vena cava is dilated in size with <50% respiratory variability, suggesting right atrial pressure of 15 mmHg.  Comparison(s): No significant change from prior study. Prior images reviewed side by side. The right ventricular hypertrophy is worse. Systolic PA pressure is higher and tricuspid insufficiency is worse. FINDINGS  Left Ventricle: Left ventricular ejection fraction, by estimation, is 60 to 65%. The left ventricle has normal function. The left ventricle has no regional wall motion abnormalities. The left ventricular internal cavity size was normal in size. There is  no left ventricular hypertrophy. Indeterminate diastolic filling due to E-A fusion. Right Ventricle: The right ventricular size is mildly enlarged. No increase in right ventricular wall thickness. Right ventricular systolic function is moderately reduced. There is moderately elevated pulmonary artery systolic pressure. The tricuspid regurgitant velocity is 3.23 m/s, and with an assumed right atrial pressure of 15 mmHg, the estimated right ventricular systolic pressure is 56.7 mmHg. Left Atrium: Left atrial size was mildly dilated. Right Atrium: Right atrial size was mildly dilated. Pericardium: There is no evidence of pericardial effusion. Mitral Valve: The mitral valve is normal in structure. Trivial mitral valve regurgitation. No evidence of mitral valve stenosis. Tricuspid Valve: The tricuspid valve is normal in structure. Tricuspid valve regurgitation is severe. No evidence of tricuspid stenosis. Aortic Valve: The aortic valve is tricuspid. Aortic valve regurgitation is mild. No aortic stenosis is present. Aortic valve peak gradient measures 3.8 mmHg. Pulmonic Valve: The pulmonic valve was normal in structure. Pulmonic valve regurgitation is trivial. No evidence of pulmonic stenosis. Aorta: The aortic root is normal in size and structure. Venous: The inferior vena cava is dilated in size with less than 50% respiratory variability, suggesting right atrial pressure of 15 mmHg. The inferior vena cava and the hepatic vein show a pattern of systolic flow  reversal, suggestive of tricuspid regurgitation. IAS/Shunts: No atrial level shunt detected by color flow Doppler.  LEFT VENTRICLE PLAX 2D LVIDd:         2.70 cm LVIDs:         2.05 cm LV PW:         1.10 cm LV IVS:        1.10 cm LVOT diam:     1.80 cm LV SV:         32 LV SV Index:   22 LVOT Area:     2.54 cm  RIGHT VENTRICLE            IVC RV Basal diam:  4.10 cm    IVC diam: 2.20 cm RV Mid diam:    3.70 cm RV S prime:     6.39 cm/s LEFT ATRIUM             Index        RIGHT ATRIUM           Index LA diam:        3.90 cm 2.71 cm/m   RA Area:     18.20 cm LA Vol (A2C):   39.8 ml 27.66 ml/m  RA  Volume:   53.00 ml  36.83 ml/m LA Vol (A4C):   34.9 ml 24.25 ml/m LA Biplane Vol: 38.6 ml 26.83 ml/m  AORTIC VALVE                 PULMONIC VALVE AV Area (Vmax): 2.00 cm     PV Vmax:       0.60 m/s AV Vmax:        97.70 cm/s   PV Peak grad:  1.4 mmHg AV Peak Grad:   3.8 mmHg LVOT Vmax:      76.90 cm/s LVOT Vmean:     45.200 cm/s LVOT VTI:       0.125 m  AORTA Ao Root diam: 2.90 cm Ao Asc diam:  2.80 cm TRICUSPID VALVE TR Peak grad:   41.7 mmHg TR Vmax:        323.00 cm/s  SHUNTS Systemic VTI:  0.12 m Systemic Diam: 1.80 cm Rachelle Hora Croitoru MD Electronically signed by Thurmon Fair MD Signature Date/Time: 08/27/2021/12:15:15 PM    Final    DG Chest 2 View  Result Date: 08/26/2021 CLINICAL DATA:  Chest pain.  Bradycardia. EXAM: CHEST - 2 VIEW COMPARISON:  08/13/2021 FINDINGS: Moderate cardiomegaly remains stable. New small right pleural effusion. Increased diffuse interstitial prominence suspicious for mild pulmonary edema. No evidence of pulmonary consolidation. IMPRESSION: Mild congestive heart failure with small right pleural effusion. Electronically Signed   By: Danae Orleans M.D.   On: 08/26/2021 10:14     COMPREHENSIVE METABOLIC PANEL GLUCOSE 89 mg/dL 16-10 Final  Fasting reference interval UREA NITROGEN (BUN) 22 mg/dL 9-60 Final CREATININE 4.54 mg/dL 0.98-1.19 H Final EGFR 54 mL/min/1.73 m2 > OR = 60  L Final BUN/CREATININE RATIO 23 (calc) 6-22 H Final SODIUM 141 mmol/L 135-146 Final POTASSIUM 4.3 mmol/L 3.5-5.3 Final CHLORIDE 104 mmol/L 98-110 Final CARBON DIOXIDE 33 mmol/L 20-32 H Final CALCIUM 8.8 mg/dL 1.4-78.2 Final PROTEIN, TOTAL 5.9 g/dL 9.5-6.2 L Final ALBUMIN 3.7 g/dL 1.3-0.8 Final GLOBULIN 2.2 g/dL (calc) 6.5-7.8 Final ALBUMIN/GLOBULIN RATIO 1.7 (calc) 1.0-2.5 Final BILIRUBIN, TOTAL 0.6 mg/dL 4.6-9.6 Final ALKALINE PHOSPHATASE 76 U/L 37-153 Final AST 23 U/L 10-35 Final ALT 15 U/L 6-29 Final  WHITE BLOOD CELL COUNT 5.3 Thousand/u L 3.8-10.8 Final RED BLOOD CELL COUNT 4.82 Million/uL 3.80-5.10 Final HEMOGLOBIN 14.9 g/dL 29.5-28.4 Final HEMATOCRIT 45.2 % 35.0-45.0 H Final MCV 93.8 fL 80.0-100.0 Final MCH 30.9 pg 27.0-33.0 Final MCHC 33.0 g/dL 13.2-44.0 Final RDW 10.2 % 11.0-15.0 Final PLATELET COUNT 221 Thousand/u L 140-400 Fina  Assessment/Plan  Major Neurocognitive disorder  No behavioral problems noted  Pt knows her name, not oriented to time or place Ambulates with an walker  Had a fall couple of months back  Pt is on palliative care but not hospice MRI showed  vebral small acute infarcts in the posterior left hemisphere in the left PCA and MCA watershed area.  FAST score 7c  1 - No difficulty either subjectively or objectively  2 - Complains of forgetting the location of objects--subjective word-finding difficulties  3 - Decreased job functioning evident to co-workers. Difficulty in traveling to new locations. Decreased organizational capacity*  4 - Decreased ability to perform complex tasks (e.g., planning dinner for guests, handling. personal finances, difficulty marketing, etc.)  5 - Requires assistance in choosing proper clothing to wear for the day, season, or occasion (e.g., a patient may wear the same clothing repeatedly unless supervised)*  6a - Improperly putting on clothes without assistance or prompting (e.g., may put street clothes on  overnight clothes, put shoes on wrong feet, or have difficulty buttoning clothing) occasionally or more frequently over the past weeks*  6b - Unable to bathe properly (e.g., difficulty adjusting bathwater temp.) occasionally or more frequently over the past weeks*  6c - Inability to handle mechanics of toileting (e.g., forgets to flush the toilet, does not wipe properly or properly dispose of toilet tissue) occasionally or more frequently over the past weeks*  6d - Urinary incontinence occasionally or more frequently over the past weeks*  6e - Fecal incontinence occasionally or more frequently over the past weeks*  7a - Ability to speak limited to approximately a half-dozen different intelligible words or fewer in an average day or the course of an intensive interview  7b - Speech ability is limited to using a single intelligible word on an average day or in an intensive interview (the person may repeat the word over and over)  7c - Ambulatory ability is lost (cannot walk without personal assistance)  7d - Cannot sit up without assistance  7e - Loss of ability to smile  27f - Loss of ability to hold head up independently  Cont with supportive care  Increase participation in group activities and cognitively engaging activities   Weight loss Assistance while feeding  Diet liberalization  Increase boost supplements   Atrial flutter/ SVT  Off amiodarone due to prolonged qtc  Rate controled Cont with metoprolol  Cont with eliquis , dose adjusted as her age and kidney function  No signs of bleeding noted   GERD  No signs of bleeding noted Cont with protonix  H/O CVA  Cont with supportive care Pt with ongoing weight loss Will check with family about stopping lipitor        Family/ staff Communication:  care plan discussed with the nursing staff  Labs/tests ordered: none   I spent greater than  30 minutes for the care of this patient in face to face time, chart review,  clinical documentation, patient education.

## 2023-03-01 DIAGNOSIS — F330 Major depressive disorder, recurrent, mild: Secondary | ICD-10-CM | POA: Diagnosis not present

## 2023-03-01 DIAGNOSIS — G4700 Insomnia, unspecified: Secondary | ICD-10-CM | POA: Diagnosis not present

## 2023-03-15 ENCOUNTER — Encounter: Payer: Self-pay | Admitting: Nurse Practitioner

## 2023-03-15 ENCOUNTER — Non-Acute Institutional Stay (SKILLED_NURSING_FACILITY): Payer: Medicare Other | Admitting: Nurse Practitioner

## 2023-03-15 DIAGNOSIS — D509 Iron deficiency anemia, unspecified: Secondary | ICD-10-CM | POA: Diagnosis not present

## 2023-03-15 DIAGNOSIS — Z8673 Personal history of transient ischemic attack (TIA), and cerebral infarction without residual deficits: Secondary | ICD-10-CM | POA: Diagnosis not present

## 2023-03-15 DIAGNOSIS — I471 Supraventricular tachycardia, unspecified: Secondary | ICD-10-CM

## 2023-03-15 DIAGNOSIS — E782 Mixed hyperlipidemia: Secondary | ICD-10-CM | POA: Diagnosis not present

## 2023-03-15 DIAGNOSIS — J439 Emphysema, unspecified: Secondary | ICD-10-CM

## 2023-03-15 DIAGNOSIS — F0390 Unspecified dementia without behavioral disturbance: Secondary | ICD-10-CM

## 2023-03-15 DIAGNOSIS — K219 Gastro-esophageal reflux disease without esophagitis: Secondary | ICD-10-CM

## 2023-03-15 DIAGNOSIS — R609 Edema, unspecified: Secondary | ICD-10-CM

## 2023-03-15 DIAGNOSIS — N1831 Chronic kidney disease, stage 3a: Secondary | ICD-10-CM

## 2023-03-15 DIAGNOSIS — F339 Major depressive disorder, recurrent, unspecified: Secondary | ICD-10-CM | POA: Diagnosis not present

## 2023-03-15 DIAGNOSIS — M150 Primary generalized (osteo)arthritis: Secondary | ICD-10-CM | POA: Diagnosis not present

## 2023-03-15 NOTE — Assessment & Plan Note (Signed)
Heart rate is in control.  

## 2023-03-15 NOTE — Assessment & Plan Note (Signed)
takes Atorvastatin, LDL 41 09/19/21 

## 2023-03-15 NOTE — Assessment & Plan Note (Signed)
11/02/19 CVA (MRI cevebral small acute infarcts in the posterior left hemisphere in the left PCA and MCA watershed area. Takes Eliquis.

## 2023-03-15 NOTE — Assessment & Plan Note (Signed)
stable, off Sertraline.  

## 2023-03-15 NOTE — Assessment & Plan Note (Signed)
01/21/20 Iron 44, ferritin 63, Vit B12 695, Hgb 14.9 06/12/22 

## 2023-03-15 NOTE — Assessment & Plan Note (Signed)
not apparent, off Torsemide. BNP 798 08/26/21, echo 08/27/21 EF 60-65%, weight stable.

## 2023-03-15 NOTE — Assessment & Plan Note (Signed)
Senile dementia since CVA, no behavioral issues. MMSE 15/30 03/20/21 

## 2023-03-15 NOTE — Assessment & Plan Note (Signed)
no further c/o abd discomfort or pain since PPI switched from Omeprazole to Pantoprazole 10/05/22. Hgb 14.9 06/12/22

## 2023-03-15 NOTE — Assessment & Plan Note (Signed)
OA lower back, aches, ambulates with walker.

## 2023-03-15 NOTE — Progress Notes (Signed)
Location:   [facility] Room Number: 19 Place of Service:  SNF (31) Provider: Arna Snipe Hays Dunnigan NP  Venita Sheffield, MD  Patient Care Team: Venita Sheffield, MD as PCP - General (Internal Medicine)  Extended Emergency Contact Information Primary Emergency Contact: [name],[name] Mobile Phone: [phone] Relation: Nephew Secondary Emergency Contact: [name],[name] Mobile Phone: [phone] Relation: Friend  Code Status:  DNR Goals of care: Advanced Directive information    12/18/2022   10:01 AM  Advanced Directives  Does Patient Have a Medical Advance Directive? Yes  Type of Estate agent of [place];Out of facility DNR (pink MOST or yellow form);Living will  Does patient want to make changes to medical advance directive? No - Patient declined  Copy of Healthcare Power of Attorney in Chart? Yes - validated most recent copy scanned in chart (See row information)  Pre-existing out of facility DNR order (yellow form or pink MOST form) Yellow form placed in chart (order not valid for inpatient use);Pink MOST form placed in chart (order not valid for inpatient use)     Chief Complaint  Patient presents with   Medical Management of Chronic Issues    HPI:  Pt is a 89 y.o. female seen today for medical management of chronic diseases.    Hx of emphysema, stable.  Edema, not apparent, off Torsemide. BNP 798 08/26/21, echo 08/27/21 EF 60-65%, weight stable.              PSVT off Amiodarone 01/2020 in hospital due to prolonged QT interval. On Metoprolol bid and prn, on Eliquis             GERD, no further c/o abd discomfort or pain since PPI switched from Omeprazole to Pantoprazole 10/05/22. Hgb 14.9 06/12/22             Depression, stable, off Sertraline.              CKD Bun/creat 22/1.0 06/12/22             Senile dementia since CVA, no behavioral issues. MMSE 15/30 03/20/21             11/02/19 CVA (MRI cevebral small acute infarcts in the posterior  left hemisphere in the left PCA and MCA watershed area. Takes Eliquis.              Anemia, 01/21/20 Iron 44, ferritin 63, Vit B12 695, Hgb 14.9 06/12/22             Hyperlipidemia, takes Atorvastatin, LDL 41 09/19/21             OA lower back, aches, ambulates with walker.      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 No pertinent surgical history.  Allergies  Allergen Reactions   Alphagan P [Brimonidine Tartrate] Other (See Comments)    Unknown reaction Listed on MAR   Azopt [Brinzolamide] Other (See Comments)    Unknown reaction Listed on MAR   Lumigan [Bimatoprost] Other (See Comments)    Unknown reaction Listed on MAR    Allergies as of 03/15/2023       Reactions   Alphagan P [brimonidine Tartrate]  Other (See Comments)   Unknown reaction Listed on MAR   Azopt [brinzolamide] Other (See Comments)   Unknown reaction Listed on MAR   Lumigan [bimatoprost] Other (See Comments)   Unknown reaction Listed on [facility]        Medication List        Accurate as of March 15, 2023  1:47 PM. If you have any questions, ask your nurse or doctor.          apixaban 2.5 MG Tabs tablet Commonly known as: ELIQUIS Take 1 tablet (2.5 mg total) by mouth 2 (two) times daily.   atorvastatin 10 MG tablet Commonly known as: LIPITOR Take 5 mg by mouth daily.   B COMPLEX VITAMINS (W/ FA) PO Take 1 tablet by mouth daily. Vitamin B complex - Folic Acid 0.4 mg   Calcium Carbonate-Vitamin D 600-400 MG-UNIT tablet Take 1 tablet by mouth daily.   lactose free nutrition Liqd Take 237 mLs by mouth daily.   melatonin 3 MG Tabs tablet Take 3 mg by mouth at bedtime.    metoprolol tartrate 25 MG tablet Commonly known as: LOPRESSOR Take 12.5 mg by mouth as needed.  for SVT HOLD IF HR < 50   metoprolol tartrate 25 MG tablet Commonly known as: LOPRESSOR Take 12.5 mg by mouth 2 (two) times daily. for SVT HOLD IF BP <90/60, HR<50   MULTIPLE VITAMIN-FOLIC ACID PO Take 1 tablet by mouth daily.   pantoprazole 40 MG tablet Commonly known as: PROTONIX Take 40 mg by mouth daily. DAW   PreviDent 5000 Booster Plus 1.1 % Pste Generic drug: Sodium Fluoride Place 1 application. onto teeth at bedtime.   timolol 0.5 % ophthalmic solution Commonly known as: TIMOPTIC Place 1 drop into both eyes daily.        Review of Systems  Unable to perform ROS: Dementia    Immunization History  Administered Date(s) Administered   DTaP 09/05/2004   Fluad Quad(high Dose 65+) 02/09/2023   Influenza, High Dose Seasonal PF 01/06/2014, 11/08/2018   Influenza, Quadrivalent, Recombinant, Inj, Pf 01/09/2018, 01/21/2019   Influenza-Unspecified 11/07/2013, 01/14/2020, 01/26/2021, 01/31/2022   Moderna Covid-19 Vaccine Bivalent Booster 63yrs & up 04/04/2021   Moderna SARS-COV2 Booster Vaccination 02/08/2022   Moderna Sars-Covid-2 Vaccination 05/09/2019, 06/06/2019, 02/22/2020, 09/06/2020   PFIZER(Purple Top)SARS-COV-2 Vaccination 01/23/2023   Pneumococcal Conjugate-13 01/06/2014   Pneumococcal Polysaccharide-23 09/05/2004   Pneumococcal-Unspecified 09/05/2004, 10/18/2014   Tdap 07/19/2022   Zoster Recombinant(Shingrix) 03/21/2021, 05/24/2021   Pertinent  Health Maintenance Due  Topic Date Due   INFLUENZA VACCINE  Completed   DEXA SCAN  Completed      08/26/2021    8:30 PM 08/27/2021   10:00 AM 01/09/2022   12:36 PM 06/25/2022   12:28 PM 07/19/2022   10:41 AM  Fall Risk  Falls in the past year?   0 0 0  Was there an injury with Fall?   0 0 0  Fall Risk Category Calculator   0 0 0  Fall Risk Category (Retired)   Low    (RETIRED) Patient Fall Risk Level High fall risk  High fall risk Low fall risk    Patient at Risk for Falls Due to   No Fall Risks No Fall Risks No Fall Risks  Fall risk Follow up   Falls evaluation completed Falls evaluation completed Falls evaluation completed   Functional Status Survey:    Vitals:   03/15/23 1326  BP: (!) 145/68  Pulse: 63  Resp: 16  Temp: 98  F (36.7 C)  SpO2: (!) 86%  Weight: 96 lb 6.4 oz (43.7 kg)   Body mass index is 20.15 kg/m. Physical Exam Vitals and nursing note reviewed.  Constitutional:      Appearance: Normal appearance.  HENT:     Head: Normocephalic and atraumatic.     Nose: Nose normal.     Mouth/Throat:     Mouth: Mucous membranes are moist.  Eyes:     Extraocular Movements: Extraocular movements intact.     Conjunctiva/sclera: Conjunctivae normal.     Pupils: Pupils are equal, round, and reactive to light.  Cardiovascular:     Rate and Rhythm: Normal rate and regular rhythm.     Heart sounds: No murmur heard. Pulmonary:     Effort: Pulmonary effort is normal.     Breath sounds: No rales.  Abdominal:     General: Bowel sounds are normal.     Palpations: Abdomen is soft.     Tenderness: There is no abdominal tenderness.  Musculoskeletal:     Cervical back: Normal range of motion and neck supple.     Right lower leg: No edema.     Left lower leg: No edema.  Skin:    General: Skin is warm and dry.     Comments: Hammer toes most of her toes Yellow, thick toe nails.   Neurological:     General: No focal deficit present.     Mental Status: She is alert and oriented to person, place, and time. Mental status is at baseline.     Gait: Gait abnormal.     Comments: Ambulates with walker.   Psychiatric:        Mood and Affect: Mood normal.        Behavior: Behavior normal.        Thought Content: Thought content normal.     Labs reviewed: Recent Labs    06/12/22 0630  NA 141  K 4.3  CL 104  CO2 33*  BUN 22*  CREATININE 1.0  CALCIUM 8.8   Recent Labs    06/12/22 0630   AST 23  ALT 15  ALKPHOS 76  ALBUMIN 3.7   Recent Labs    06/12/22 0630  WBC 5.3  NEUTROABS 3,593.00  HGB 14.9  HCT 45  PLT 221   Lab Results  Component Value Date   TSH 3.96 06/12/2022   Lab Results  Component Value Date   HGBA1C 5.9 (H) 11/04/2019   Lab Results  Component Value Date   CHOL 110 09/19/2021   HDL 55 09/19/2021   LDLCALC 41 09/19/2021   TRIG 56 09/19/2021   CHOLHDL 2.8 11/06/2019    Significant Diagnostic Results in last 30 days:  No results found.  Assessment/Plan  GERD (gastroesophageal reflux disease) no further c/o abd discomfort or pain since PPI switched from Omeprazole to Pantoprazole 10/05/22. Hgb 14.9 06/12/22  Depression, recurrent (HCC) stable, off Sertraline.   CKD (chronic kidney disease) stage 3, GFR 30-59 ml/min (HCC) Bun/creat 22/1.0 06/12/22  Senile dementia (HCC) Senile dementia since CVA, no behavioral issues. MMSE 15/30 03/20/21  History of stroke 11/02/19 CVA (MRI cevebral small acute infarcts in the posterior left hemisphere in the left PCA and MCA watershed area. Takes Eliquis.   Anemia, iron deficiency 01/21/20 Iron 44, ferritin 63, Vit B12 695, Hgb 14.9 06/12/22  Mixed hyperlipidemia  takes Atorvastatin, LDL 41 09/19/21  Osteoarthritis, multiple sites OA lower back, aches, ambulates with walker.   Paroxysmal SVT (supraventricular tachycardia) (HCC)  Heart rate is in control  Edema not apparent, off Torsemide. BNP 798 08/26/21, echo 08/27/21 EF 60-65%, weight stable.    Family/ staff Communication: plan of care reviewed with the patient and charge nurse.   Labs/tests ordered:  none  Time spend 30 minutes.

## 2023-03-15 NOTE — Assessment & Plan Note (Signed)
stable °

## 2023-03-15 NOTE — Assessment & Plan Note (Signed)
Bun/creat 22/1.0 06/12/22 

## 2023-03-18 DIAGNOSIS — R636 Underweight: Secondary | ICD-10-CM | POA: Diagnosis not present

## 2023-03-18 DIAGNOSIS — Z515 Encounter for palliative care: Secondary | ICD-10-CM | POA: Diagnosis not present

## 2023-03-18 DIAGNOSIS — I10 Essential (primary) hypertension: Secondary | ICD-10-CM | POA: Diagnosis not present

## 2023-03-18 DIAGNOSIS — F03C0 Unspecified dementia, severe, without behavioral disturbance, psychotic disturbance, mood disturbance, and anxiety: Secondary | ICD-10-CM | POA: Diagnosis not present

## 2023-04-18 ENCOUNTER — Non-Acute Institutional Stay (SKILLED_NURSING_FACILITY): Payer: Self-pay | Admitting: Nurse Practitioner

## 2023-04-18 DIAGNOSIS — D509 Iron deficiency anemia, unspecified: Secondary | ICD-10-CM

## 2023-04-18 DIAGNOSIS — I639 Cerebral infarction, unspecified: Secondary | ICD-10-CM

## 2023-04-18 DIAGNOSIS — K219 Gastro-esophageal reflux disease without esophagitis: Secondary | ICD-10-CM

## 2023-04-18 DIAGNOSIS — I471 Supraventricular tachycardia, unspecified: Secondary | ICD-10-CM | POA: Diagnosis not present

## 2023-04-18 DIAGNOSIS — N1831 Chronic kidney disease, stage 3a: Secondary | ICD-10-CM | POA: Diagnosis not present

## 2023-04-18 DIAGNOSIS — F339 Major depressive disorder, recurrent, unspecified: Secondary | ICD-10-CM

## 2023-04-18 DIAGNOSIS — E782 Mixed hyperlipidemia: Secondary | ICD-10-CM

## 2023-04-18 DIAGNOSIS — F0390 Unspecified dementia without behavioral disturbance: Secondary | ICD-10-CM

## 2023-04-19 ENCOUNTER — Encounter: Payer: Self-pay | Admitting: Nurse Practitioner

## 2023-04-19 NOTE — Assessment & Plan Note (Signed)
 off Atorvastatin, LDL 41 09/19/21

## 2023-04-19 NOTE — Assessment & Plan Note (Signed)
Senile dementia since CVA, no behavioral issues. MMSE 15/30 03/20/21 

## 2023-04-19 NOTE — Assessment & Plan Note (Signed)
OA lower back, aches, ambulates with walker.

## 2023-04-19 NOTE — Assessment & Plan Note (Signed)
off Amiodarone 01/2020 in hospital due to prolonged QT interval. On Metoprolol bid and prn,  Eliquis.  

## 2023-04-19 NOTE — Progress Notes (Signed)
 Location:   [facility] Room Number: 40A Place of Service:  SNF (31) Provider: Larwance Luken Shadowens NP  Sherlynn Madden, MD  Patient Care Team: Sherlynn Madden, MD as PCP - General (Internal Medicine)  Extended Emergency Contact Information Primary Emergency Contact: [name],[name] Mobile Phone: [phone] Relation: Nephew Secondary Emergency Contact: [name],[name] Mobile Phone: [phone] Relation: Friend  Code Status:  DNR Goals of care: Advanced Directive information    12/18/2022   10:01 AM  Advanced Directives  Does Patient Have a Medical Advance Directive? Yes  Type of Estate Agent of [place];Out of facility DNR (pink MOST or yellow form);Living will  Does patient want to make changes to medical advance directive? No - Patient declined  Copy of Healthcare Power of Attorney in Chart? Yes - validated most recent copy scanned in chart (See row information)  Pre-existing out of facility DNR order (yellow form or pink MOST form) Yellow form placed in chart (order not valid for inpatient use);Pink MOST form placed in chart (order not valid for inpatient use)     Chief Complaint  Patient presents with   Medical Management of Chronic Issues    HPI:  Pt is a 89 y.o. female seen today for medical management of chronic diseases.      Hx of emphysema, stable.  Edema, not apparent, off Torsemide. BNP 798 08/26/21, echo 08/27/21 EF 60-65%, weight stable.              PSVT off Amiodarone  01/2020 in hospital due to prolonged QT interval. On Metoprolol  bid and prn, Eliquis              GERD, no further c/o abd discomfort or pain since PPI switched from Omeprazole  to Pantoprazole  10/05/22. Hgb 14.9 06/12/22             Depression, stable, off Sertraline .              CKD Bun/creat 22/1.0 06/12/22             Senile dementia since CVA, no behavioral issues. MMSE 15/30 03/20/21             11/02/19 CVA (MRI cevebral small acute infarcts in the posterior  left hemisphere in the left PCA and MCA watershed area. Takes Eliquis .              Anemia, 01/21/20 Iron 44, ferritin 63, Vit B12 695, Hgb 14.9 06/12/22             Hyperlipidemia, off Atorvastatin , LDL 41 09/19/21             OA lower back, aches, ambulates with walker.   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 No pertinent surgical history.  Allergies  Allergen Reactions   Alphagan P [Brimonidine Tartrate] Other (See Comments)    Unknown reaction Listed on MAR   Azopt [Brinzolamide] Other (See Comments)    Unknown reaction Listed on MAR   Lumigan [Bimatoprost] Other (See Comments)    Unknown reaction Listed on MAR    Allergies as of 04/18/2023       Reactions   Alphagan P [brimonidine Tartrate] Other (See  Comments)   Unknown reaction Listed on MAR   Azopt [brinzolamide] Other (See Comments)   Unknown reaction Listed on MAR   Lumigan [bimatoprost] Other (See Comments)   Unknown reaction Listed on [facility]        Medication List        Accurate as of April 18, 2023 11:59 PM. If you have any questions, ask your nurse or doctor.          apixaban  2.5 MG Tabs tablet Commonly known as: ELIQUIS  Take 1 tablet (2.5 mg total) by mouth 2 (two) times daily.   atorvastatin  10 MG tablet Commonly known as: LIPITOR Take 5 mg by mouth daily.   B COMPLEX VITAMINS (W/ FA) PO Take 1 tablet by mouth daily. Vitamin B complex - Folic Acid 0.4 mg   Calcium  Carbonate-Vitamin D 600-400 MG-UNIT tablet Take 1 tablet by mouth daily.   lactose free nutrition Liqd Take 237 mLs by mouth daily.   melatonin 3 MG Tabs tablet Take 3 mg by mouth at bedtime.   metoprolol   tartrate 25 MG tablet Commonly known as: LOPRESSOR  Take 12.5 mg by mouth as needed.  for SVT HOLD IF HR < 50   metoprolol  tartrate 25 MG tablet Commonly known as: LOPRESSOR  Take 12.5 mg by mouth 2 (two) times daily. for SVT HOLD IF BP <90/60, HR<50   MULTIPLE VITAMIN-FOLIC ACID PO Take 1 tablet by mouth daily.   pantoprazole  40 MG tablet Commonly known as: PROTONIX  Take 40 mg by mouth daily. DAW   PreviDent 5000 Booster Plus 1.1 % Pste Generic drug: Sodium Fluoride Place 1 application. onto teeth at bedtime.   timolol  0.5 % ophthalmic solution Commonly known as: TIMOPTIC  Place 1 drop into both eyes daily.        Review of Systems  Unable to perform ROS: Dementia    Immunization History  Administered Date(s) Administered   DTaP 09/05/2004   Fluad Quad(high Dose 65+) 02/09/2023   Influenza, High Dose Seasonal PF 01/06/2014, 11/08/2018   Influenza, Quadrivalent, Recombinant, Inj, Pf 01/09/2018, 01/21/2019   Influenza-Unspecified 11/07/2013, 01/14/2020, 01/26/2021, 01/31/2022   Moderna Covid-19 Vaccine  Bivalent Booster 12yrs & up 04/04/2021   Moderna SARS-COV2 Booster Vaccination 02/08/2022   Moderna Sars-Covid-2 Vaccination 05/09/2019, 06/06/2019, 02/22/2020, 09/06/2020   PFIZER(Purple Top)SARS-COV-2 Vaccination 01/23/2023   Pneumococcal Conjugate-13 01/06/2014   Pneumococcal Polysaccharide-23 09/05/2004   Pneumococcal-Unspecified 09/05/2004, 10/18/2014   Tdap 07/19/2022   Zoster Recombinant(Shingrix) 03/21/2021, 05/24/2021   Pertinent  Health Maintenance Due  Topic Date Due   INFLUENZA VACCINE  Completed   DEXA SCAN  Completed      08/26/2021    8:30 PM 08/27/2021   10:00 AM 01/09/2022   12:36 PM 06/25/2022   12:28 PM 07/19/2022   10:41 AM  Fall Risk  Falls in the past year?   0 0 0  Was there an injury with Fall?   0 0 0  Fall Risk Category Calculator   0 0 0  Fall Risk Category (Retired)   Low    (RETIRED) Patient Fall Risk Level High fall risk High fall  risk Low fall risk    Patient at Risk for Falls Due to   No Fall Risks No Fall Risks No Fall Risks  Fall risk Follow up   Falls evaluation completed Falls evaluation completed Falls evaluation completed   Functional Status Survey:    Vitals:   04/19/23 1244  BP: 122/70  Pulse: 62  Resp: 16  Temp: (!) 97.5 F (36.4 C)  SpO2: 96%  Weight: 98 lb 11.2 oz (44.8 kg)   Body mass index is 20.63 kg/m. Physical Exam Vitals and nursing note reviewed.  Constitutional:      Appearance: Normal appearance.  HENT:     Head: Normocephalic and atraumatic.     Nose: Nose normal.     Mouth/Throat:     Mouth: Mucous membranes are moist.  Eyes:     Extraocular Movements: Extraocular movements intact.     Conjunctiva/sclera: Conjunctivae normal.     Pupils: Pupils are equal, round, and reactive to light.  Cardiovascular:     Rate and Rhythm: Normal rate and regular rhythm.     Heart sounds: No murmur heard. Pulmonary:     Effort: Pulmonary effort is normal.     Breath sounds: No rales.  Abdominal:     General: Bowel sounds are normal.     Palpations: Abdomen is soft.     Tenderness: There is no abdominal tenderness.  Musculoskeletal:     Cervical back: Normal range of motion and neck supple.     Right lower leg: No edema.     Left lower leg: No edema.  Skin:    General: Skin is warm and dry.     Comments: Hammer toes most of her toes Yellow, thick toe nails.   Neurological:     General: No focal deficit present.     Mental Status: She is alert and oriented to person, place, and time. Mental status is at baseline.     Gait: Gait abnormal.     Comments: Ambulates with walker.   Psychiatric:        Mood and Affect: Mood normal.        Behavior: Behavior normal.        Thought Content: Thought content normal.     Labs reviewed: Recent Labs    06/12/22 0630  NA 141  K 4.3  CL 104  CO2 33*  BUN 22*  CREATININE 1.0  CALCIUM  8.8   Recent Labs    06/12/22 0630  AST 23   ALT 15  ALKPHOS 76  ALBUMIN 3.7   Recent Labs    06/12/22 0630  WBC 5.3  NEUTROABS 3,593.00  HGB 14.9  HCT 45  PLT 221   Lab Results  Component Value Date   TSH 3.96 06/12/2022   Lab Results  Component Value Date   HGBA1C 5.9 (H) 11/04/2019   Lab Results  Component Value Date   CHOL 110 09/19/2021   HDL 55 09/19/2021   LDLCALC 41 09/19/2021   TRIG 56 09/19/2021   CHOLHDL 2.8 11/06/2019    Significant Diagnostic Results in last 30 days:  No results found.  Assessment/Plan  Paroxysmal SVT (supraventricular tachycardia) (HCC)  off Amiodarone  01/2020 in hospital due to prolonged QT interval. On Metoprolol  bid and prn, Eliquis   GERD (gastroesophageal reflux disease)  no further c/o abd discomfort or pain since PPI switched from Omeprazole  to Pantoprazole  10/05/22. Hgb 14.9 06/12/22  Depression, recurrent (HCC) stable, off Sertraline .   CKD (chronic kidney disease) stage 3, GFR 30-59 ml/min (HCC) Bun/creat 22/1.0 06/12/22  Senile dementia (HCC) Senile dementia since CVA, no behavioral issues. MMSE 15/30 03/20/21  Stroke (HCC)   11/02/19 CVA (MRI cevebral small acute infarcts in the posterior left hemisphere in the left PCA and MCA watershed area. Takes Eliquis .   Anemia, iron deficiency 01/21/20 Iron 44, ferritin 63, Vit B12 695, Hgb 14.9 06/12/22  Mixed hyperlipidemia  off Atorvastatin , LDL  41 09/19/21  Osteoarthritis, multiple sites  OA lower back, aches, ambulates with walker.    Family/ staff Communication: plan of care reviewed with the patient and charge nurse.   Labs/tests ordered:  none  Time spend 30 minutes.

## 2023-04-19 NOTE — Assessment & Plan Note (Signed)
Bun/creat 22/1.0 06/12/22 

## 2023-04-19 NOTE — Assessment & Plan Note (Signed)
no further c/o abd discomfort or pain since PPI switched from Omeprazole to Pantoprazole 10/05/22. Hgb 14.9 06/12/22

## 2023-04-19 NOTE — Assessment & Plan Note (Signed)
stable, off Sertraline.  

## 2023-04-19 NOTE — Assessment & Plan Note (Signed)
11/02/19 CVA (MRI cevebral small acute infarcts in the posterior left hemisphere in the left PCA and MCA watershed area. Takes Eliquis.

## 2023-04-19 NOTE — Assessment & Plan Note (Signed)
01/21/20 Iron 44, ferritin 63, Vit B12 695, Hgb 14.9 06/12/22 

## 2023-05-17 ENCOUNTER — Encounter: Payer: Self-pay | Admitting: Sports Medicine

## 2023-05-17 ENCOUNTER — Non-Acute Institutional Stay (SKILLED_NURSING_FACILITY): Payer: Self-pay | Admitting: Sports Medicine

## 2023-05-17 DIAGNOSIS — Z515 Encounter for palliative care: Secondary | ICD-10-CM | POA: Diagnosis not present

## 2023-05-17 DIAGNOSIS — R634 Abnormal weight loss: Secondary | ICD-10-CM | POA: Diagnosis not present

## 2023-05-17 DIAGNOSIS — F0390 Unspecified dementia without behavioral disturbance: Secondary | ICD-10-CM | POA: Diagnosis not present

## 2023-05-17 NOTE — Progress Notes (Signed)
 Provider:  Dr. Jackalyn [name] Location:  [facility] Place of Service:   Skilled care   PCP: [name] Jackalyn, MD Patient Care Team: [name] Jackalyn, MD as PCP - General (Internal Medicine)  Extended Emergency Contact Information Primary Emergency Contact: [name],[name] Mobile Phone: [phone] Relation: Nephew Secondary Emergency Contact: [name],[name] Mobile Phone: [phone] Relation: Friend  Goals of Care: Advanced Directive information    12/18/2022   10:01 AM  Advanced Directives  Does Patient Have a Medical Advance Directive? Yes  Type of Estate Agent of [place];Out of facility DNR (pink MOST or yellow form);Living will  Does patient want to make changes to medical advance directive? No - Patient declined  Copy of Healthcare Power of Attorney in Chart? Yes - validated most recent copy scanned in chart (See row information)  Pre-existing out of facility DNR order (yellow form or pink MOST form) Yellow form placed in chart (order not valid for inpatient use);Pink MOST form placed in chart (order not valid for inpatient use)                                       HOSPICE RELATED VISIT   History of Present Illness         89 yr old F with h/o dementia, PVST off the amiodarone , GERD, H/O CVA, HLD was evaluated for follow up Pt seen and examined in her room. She is laying comfortably on her bed.  She is verbal but does not comprehend and follow the conversation. When asked her name, she replied its hard to say. Seems pleasant and comfortable and does not appear to be in distress. As per staff , she is ambulatory and moves around with the walker, She does not eat much and is on mechanical soft diet  No coughing or choking noted as per nursing staff  No reports of agitation as per staff        Past Medical History:  Diagnosis Date   Atrial flutter, paroxysmal (HCC)    COVID-19    Dementia (HCC)    Diastolic  dysfunction    Hyperlipidemia    Hypertension    PSVT (paroxysmal supraventricular tachycardia) (HCC)    Stroke (HCC)    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 (HCC)    No past surgical history on file.  reports that she has never smoked. She has never used smokeless tobacco. She reports that she does not drink alcohol and does not use drugs. Social History   Socioeconomic History   Marital status: Widowed    Spouse name: Not on file   Number of children: 0   Years of education: Not on file   Highest education level: Not on file  Occupational History   Not on file  Tobacco Use   Smoking status: Never   Smokeless tobacco: Never  Vaping Use   Vaping status: Never Used  Substance and Sexual Activity   Alcohol use: Never   Drug use: Never   Sexual activity: Not on file  Other Topics Concern   Not on file  Social History Narrative   Not on file   Social Drivers of Health   Financial Resource Strain: Not on file  Food Insecurity: No Food Insecurity (08/16/2021)   Hunger Vital Sign    Worried About Running Out of Food in the Last Year: Never true    Ran  Out of Food in the Last Year: Never true  Transportation Needs: Not on file  Physical Activity: Not on file  Stress: Not on file  Social Connections: Not on file  Intimate Partner Violence: Not on file    Functional Status Survey:    No family history on file.  Health Maintenance  Topic Date Due   COVID-19 Vaccine (8 - 2024-25 season) 03/20/2023   Medicare Annual Wellness (AWV)  07/19/2023   DTaP/Tdap/Td (3 - Td or Tdap) 07/18/2032   Pneumonia Vaccine 5+ Years old  Completed   INFLUENZA VACCINE  Completed   DEXA SCAN  Completed   Zoster Vaccines- Shingrix  Completed   HPV VACCINES  Aged Out    Allergies  Allergen Reactions   Alphagan P [Brimonidine Tartrate] Other (See Comments)    Unknown reaction Listed on MAR   Azopt [Brinzolamide] Other (See Comments)     Unknown reaction Listed on MAR   Lumigan [Bimatoprost] Other (See Comments)    Unknown reaction Listed on [facility]    Outpatient Encounter Medications as of 05/17/2023  Medication Sig   apixaban  (ELIQUIS ) 2.5 MG TABS tablet Take 1 tablet (2.5 mg total) by mouth 2 (two) times daily.   atorvastatin  (LIPITOR) 10 MG tablet Take 5 mg by mouth daily.   B Complex-Folic Acid (B COMPLEX VITAMINS, W/ FA, PO) Take 1 tablet by mouth daily. Vitamin B complex - Folic Acid 0.4 mg   Calcium  Carbonate-Vitamin D 600-400 MG-UNIT tablet Take 1 tablet by mouth daily.   lactose free nutrition (BOOST) LIQD Take 237 mLs by mouth daily.   melatonin 3 MG TABS tablet Take 3 mg by mouth at bedtime.   metoprolol  tartrate (LOPRESSOR ) 25 MG tablet Take 12.5 mg by mouth as needed.  for SVT HOLD IF HR < 50   metoprolol  tartrate (LOPRESSOR ) 25 MG tablet Take 12.5 mg by mouth 2 (two) times daily. for SVT HOLD IF BP <90/60, HR<50   MULTIPLE VITAMIN-FOLIC ACID PO Take 1 tablet by mouth daily.   pantoprazole  (PROTONIX ) 40 MG tablet Take 40 mg by mouth daily. DAW   Sodium Fluoride (PREVIDENT 5000 BOOSTER PLUS) 1.1 % PSTE Place 1 application. onto teeth at bedtime.   timolol  (TIMOPTIC ) 0.5 % ophthalmic solution Place 1 drop into both eyes daily.   No facility-administered encounter medications on file as of 05/17/2023.    Review of Systems  Unable to perform ROS: Dementia  Respiratory:  Negative for cough.   Cardiovascular:  Negative for leg swelling.  Gastrointestinal:  Negative for abdominal pain, blood in stool, diarrhea, nausea and vomiting.  Genitourinary:  Negative for dysuria.  Psychiatric/Behavioral:  Positive for confusion.    Negative unless indicated in HPI.  There were no vitals filed for this visit. There is no height or weight on file to calculate BMI. BP Readings from Last 3 Encounters:  04/19/23 122/70  03/15/23 (!) 145/68  02/14/23 (!) 106/40   Wt Readings from Last 3 Encounters:  04/19/23 98 lb 11.2  oz (44.8 kg)  03/15/23 96 lb 6.4 oz (43.7 kg)  02/14/23 97 lb 1.6 oz (44 kg)   Physical Exam Constitutional:      Appearance: Normal appearance.  HENT:     Head: Normocephalic and atraumatic.  Cardiovascular:     Rate and Rhythm: Normal rate and regular rhythm.  Pulmonary:     Effort: Pulmonary effort is normal. No respiratory distress.     Breath sounds: Normal breath sounds. No wheezing.  Abdominal:  General: Bowel sounds are normal. There is no distension.     Tenderness: There is no abdominal tenderness. There is no guarding or rebound.     Comments:    Musculoskeletal:        General: No swelling or tenderness.  Skin:    General: Skin is dry.  Neurological:     Mental Status: She is alert. Mental status is at baseline.     Motor: No weakness.     Labs reviewed: Basic Metabolic Panel: Recent Labs    06/12/22 0630  NA 141  K 4.3  CL 104  CO2 33*  BUN 22*  CREATININE 1.0  CALCIUM  8.8   Liver Function Tests: Recent Labs    06/12/22 0630  AST 23  ALT 15  ALKPHOS 76  ALBUMIN 3.7   No results for input(s): LIPASE, AMYLASE in the last 8760 hours. No results for input(s): AMMONIA in the last 8760 hours. CBC: Recent Labs    06/12/22 0630  WBC 5.3  NEUTROABS 3,593.00  HGB 14.9  HCT 45  PLT 221   Cardiac Enzymes: No results for input(s): CKTOTAL, CKMB, CKMBINDEX, TROPONINI in the last 8760 hours. BNP: Invalid input(s): POCBNP Lab Results  Component Value Date   HGBA1C 5.9 (H) 11/04/2019   Lab Results  Component Value Date   TSH 3.96 06/12/2022   Lab Results  Component Value Date   VITAMINB12 695 01/21/2020   No results found for: FOLATE Lab Results  Component Value Date   IRON 44 01/21/2020   TIBC 273 01/21/2020   FERRITIN 63 01/21/2020    Imaging and Procedures obtained prior to SNF admission: ECHOCARDIOGRAM COMPLETE Result Date: 08/27/2021    ECHOCARDIOGRAM REPORT   Patient Name:   Bianca [name] Date of Exam:  08/27/2021 Medical Rec #:  [id number]        Height:       58.0 in Accession #:    [id number]       Weight:       114.9 lb Date of Birth:  [birth year]         BSA:          1.439 m Patient Age:    89 years         BP:           103/92 mmHg Patient Gender: F                HR:           124 bpm. Exam Location:  Inpatient Procedure: 2D Echo, Cardiac Doppler and Color Doppler Indications:    CHF  History:        Patient has prior history of Echocardiogram examinations, most                 recent 11/05/2019. Arrythmias:Tachycardia.  Sonographer:    Elida Casey Referring Phys: [id number] RAVI PAHWANI IMPRESSIONS  1. Left ventricular ejection fraction, by estimation, is 60 to 65%. The left ventricle has normal function. The left ventricle has no regional wall motion abnormalities. Indeterminate diastolic filling due to E-A fusion.  2. Right ventricular systolic function is moderately reduced. The right ventricular size is mildly enlarged. There is moderately elevated pulmonary artery systolic pressure.  3. Left atrial size was mildly dilated.  4. Right atrial size was mildly dilated.  5. The mitral valve is normal in structure. Trivial mitral valve regurgitation. No evidence of mitral stenosis.  6. Tricuspid valve regurgitation is severe.  7.  The aortic valve is tricuspid. Aortic valve regurgitation is mild. No aortic stenosis is present.  8. The inferior vena cava is dilated in size with <50% respiratory variability, suggesting right atrial pressure of 15 mmHg. Comparison(s): No significant change from prior study. Prior images reviewed side by side. The right ventricular hypertrophy is worse. Systolic PA pressure is higher and tricuspid insufficiency is worse. FINDINGS  Left Ventricle: Left ventricular ejection fraction, by estimation, is 60 to 65%. The left ventricle has normal function. The left ventricle has no regional wall motion abnormalities. The left ventricular internal cavity size was normal in size. There is   no left ventricular hypertrophy. Indeterminate diastolic filling due to E-A fusion. Right Ventricle: The right ventricular size is mildly enlarged. No increase in right ventricular wall thickness. Right ventricular systolic function is moderately reduced. There is moderately elevated pulmonary artery systolic pressure. The tricuspid regurgitant velocity is 3.23 m/s, and with an assumed right atrial pressure of 15 mmHg, the estimated right ventricular systolic pressure is 56.7 mmHg. Left Atrium: Left atrial size was mildly dilated. Right Atrium: Right atrial size was mildly dilated. Pericardium: There is no evidence of pericardial effusion. Mitral Valve: The mitral valve is normal in structure. Trivial mitral valve regurgitation. No evidence of mitral valve stenosis. Tricuspid Valve: The tricuspid valve is normal in structure. Tricuspid valve regurgitation is severe. No evidence of tricuspid stenosis. Aortic Valve: The aortic valve is tricuspid. Aortic valve regurgitation is mild. No aortic stenosis is present. Aortic valve peak gradient measures 3.8 mmHg. Pulmonic Valve: The pulmonic valve was normal in structure. Pulmonic valve regurgitation is trivial. No evidence of pulmonic stenosis. Aorta: The aortic root is normal in size and structure. Venous: The inferior vena cava is dilated in size with less than 50% respiratory variability, suggesting right atrial pressure of 15 mmHg. The inferior vena cava and the hepatic vein show a pattern of systolic flow reversal, suggestive of tricuspid regurgitation. IAS/Shunts: No atrial level shunt detected by color flow Doppler.  LEFT VENTRICLE PLAX 2D LVIDd:         2.70 cm LVIDs:         2.05 cm LV PW:         1.10 cm LV IVS:        1.10 cm LVOT diam:     1.80 cm LV SV:         32 LV SV Index:   22 LVOT Area:     2.54 cm  RIGHT VENTRICLE            IVC RV Basal diam:  4.10 cm    IVC diam: 2.20 cm RV Mid diam:    3.70 cm RV S prime:     6.39 cm/s LEFT ATRIUM             Index         RIGHT ATRIUM           Index LA diam:        3.90 cm 2.71 cm/m   RA Area:     18.20 cm LA Vol (A2C):   39.8 ml 27.66 ml/m  RA Volume:   53.00 ml  36.83 ml/m LA Vol (A4C):   34.9 ml 24.25 ml/m LA Biplane Vol: 38.6 ml 26.83 ml/m  AORTIC VALVE                 PULMONIC VALVE AV Area (Vmax): 2.00 cm     PV Vmax:  0.60 m/s AV Vmax:        97.70 cm/s   PV Peak grad:  1.4 mmHg AV Peak Grad:   3.8 mmHg LVOT Vmax:      76.90 cm/s LVOT Vmean:     45.200 cm/s LVOT VTI:       0.125 m  AORTA Ao Root diam: 2.90 cm Ao Asc diam:  2.80 cm TRICUSPID VALVE TR Peak grad:   41.7 mmHg TR Vmax:        323.00 cm/s  SHUNTS Systemic VTI:  0.12 m Systemic Diam: 1.80 cm Jerel Balding MD Electronically signed by Jerel Balding MD Signature Date/Time: 08/27/2021/12:15:15 PM    Final    DG Chest 2 View Result Date: 08/26/2021 CLINICAL DATA:  Chest pain.  Bradycardia. EXAM: CHEST - 2 VIEW COMPARISON:  08/13/2021 FINDINGS: Moderate cardiomegaly remains stable. New small right pleural effusion. Increased diffuse interstitial prominence suspicious for mild pulmonary edema. No evidence of pulmonary consolidation. IMPRESSION: Mild congestive heart failure with small right pleural effusion. Electronically Signed   By: Norleen DELENA Kil M.D.   On: 08/26/2021 10:14    Assessment and Plan      1. Major neurocognitive disorder (HCC) (Primary) Cont with supportive care 1 - No difficulty either subjectively or objectively  2 - Complains of forgetting the location of objects--subjective word-finding difficulties  3 - Decreased job functioning evident to co-workers. Difficulty in traveling to new locations. Decreased organizational capacity*  4 - Decreased ability to perform complex tasks (e.g., planning dinner for guests, handling. personal finances, difficulty marketing, etc.)  5 - Requires assistance in choosing proper clothing to wear for the day, season, or occasion (e.g., a patient may wear the same clothing repeatedly unless  supervised)*  6a - Improperly putting on clothes without assistance or prompting (e.g., may put street clothes on overnight clothes, put shoes on wrong feet, or have difficulty buttoning clothing) occasionally or more frequently over the past weeks*  6b - Unable to bathe properly (e.g., difficulty adjusting bathwater temp.) occasionally or more frequently over the past weeks*  6c - Inability to handle mechanics of toileting (e.g., forgets to flush the toilet, does not wipe properly or properly dispose of toilet tissue) occasionally or more frequently over the past weeks*  6d - Urinary incontinence occasionally or more frequently over the past weeks*  6e - Fecal incontinence occasionally or more frequently over the past weeks*  7a - Ability to speak limited to approximately a half-dozen different intelligible words or fewer in an average day or the course of an intensive interview  7b - Speech ability is limited to using a single intelligible word on an average day or in an intensive interview (the person may repeat the word over and over)  7c - Ambulatory ability is lost (cannot walk without personal assistance)  7d - Cannot sit up without assistance  7e - Loss of ability to smile  23f - Loss of ability to hold head up independently  Pt is fast stage 7b Weight loss +    2. Weight loss Diet liberalization  Boost supplements    3. Hospice care patient Cont with supportive care       30 min Total time spent for obtaining history,  performing a medically appropriate examination and evaluation, reviewing the tests, documenting clinical information in the electronic or other health record,  ,care coordination (not separately reported)

## 2023-05-29 ENCOUNTER — Encounter: Payer: Self-pay | Admitting: Orthopedic Surgery

## 2023-05-29 ENCOUNTER — Non-Acute Institutional Stay (SKILLED_NURSING_FACILITY): Payer: Self-pay | Admitting: Orthopedic Surgery

## 2023-05-29 DIAGNOSIS — R062 Wheezing: Secondary | ICD-10-CM | POA: Diagnosis not present

## 2023-05-29 DIAGNOSIS — J101 Influenza due to other identified influenza virus with other respiratory manifestations: Secondary | ICD-10-CM

## 2023-05-29 NOTE — Progress Notes (Signed)
Location:   [facility] Room Number: 40-A Place of Service:  SNF (31) Provider:  Hazle Nordmann, NP  PCP: Venita Sheffield, MD  Patient Care Team: Venita Sheffield, MD as PCP - General (Internal Medicine)  Extended Emergency Contact Information Primary Emergency Contact: [name],[name] Mobile Phone: [phone] Relation: Nephew Secondary Emergency Contact: [name],[name] Mobile Phone: [phone] Relation: Friend  Code Status:  DNR Goals of care: Advanced Directive information    05/29/2023   11:57 AM  Advanced Directives  Does Patient Have a Medical Advance Directive? Yes  Type of Estate agent of [place];Living will;Out of facility DNR (pink MOST or yellow form)  Does patient want to make changes to medical advance directive? No - Patient declined  Copy of Healthcare Power of Attorney in Chart? Yes - validated most recent copy scanned in chart (See row information)     Chief Complaint  Patient presents with   Acute Visit    Flu positive.     HPI:  Pt is a 89 y.o. female seen today for an acute visit due to positive Flu A test.   She currently resides on the skilled nursing unit at [facility]. PMH: junctional tachycardia, PAF, SVT, stroke, emphysema, GERD, chronic diarrhea, CKD, malnutrition and depression.   This morning nursing reports increased malaise, runny nose and dry cough. COVID negative. She tested positive for Flu A. Poor historian due to neurocognitive disorder. She admits to " not feeling well." Recent vitals: bp 100/63, HR 68, RR 17, 95% on RA, temp 97.0. Treatment options discussed with son/ HPOA, agreeable to start Tamiflu and duonebs.    Past Medical History:  Diagnosis Date   Atrial flutter, paroxysmal (HCC)    COVID-19    Dementia (HCC)    Diastolic dysfunction    Hyperlipidemia    Hypertension    PSVT (paroxysmal supraventricular tachycardia) (HCC)    Stroke (HCC)    Wet  age-related macular degeneration of both eyes with active choroidal neovascularization (HCC)    History reviewed. No pertinent surgical history.  Allergies  Allergen Reactions   Alphagan P [Brimonidine Tartrate] Other (See Comments)    Unknown reaction Listed on MAR   Azopt [Brinzolamide] Other (See Comments)    Unknown reaction Listed on MAR   Lumigan [Bimatoprost] Other (See Comments)    Unknown reaction Listed on MAR    Allergies as of 05/29/2023       Reactions   Alphagan P [brimonidine Tartrate] Other (See Comments)   Unknown reaction Listed on MAR   Azopt [brinzolamide] Other (See Comments)   Unknown reaction Listed on MAR   Lumigan [bimatoprost] Other (See Comments)   Unknown reaction Listed on [facility]        Medication List        Accurate as of May 29, 2023 11:57 AM. If you have any questions, ask your nurse or doctor.          STOP taking these medications    atorvastatin 10 MG tablet Commonly known as: LIPITOR Stopped by: Octavia Heir   MULTIPLE VITAMIN-FOLIC ACID PO Stopped by: Etola Mull E Jamaurion Slemmer       TAKE these medications    apixaban 2.5 MG Tabs tablet Commonly known as: ELIQUIS Take 1 tablet (2.5 mg total) by mouth 2 (two) times daily.   B COMPLEX VITAMINS (W/ FA) PO Take 1 tablet by mouth daily. Vitamin B complex - Folic Acid 0.4 mg   Calcium Carbonate-Vitamin D 600-400 MG-UNIT  tablet Take 1 tablet by mouth daily.   lactose free nutrition Liqd Take 237 mLs by mouth daily.   melatonin 3 MG Tabs tablet Take 3 mg by mouth at bedtime.   metoprolol tartrate 25 MG tablet Commonly known as: LOPRESSOR Take 12.5 mg by mouth as needed.  for SVT HOLD IF HR < 50   metoprolol tartrate 25 MG tablet Commonly known as: LOPRESSOR Take 12.5 mg by mouth 2 (two) times daily. for SVT HOLD IF BP <90/60, HR<50   oseltamivir 75 MG capsule Commonly known as: TAMIFLU Take 75 mg by mouth 2 (two) times daily.   pantoprazole 40 MG tablet Commonly  known as: PROTONIX Take 40 mg by mouth daily. DAW   PreviDent 5000 Booster Plus 1.1 % Pste Generic drug: Sodium Fluoride Place 1 application. onto teeth at bedtime.   timolol 0.5 % ophthalmic solution Commonly known as: TIMOPTIC Place 1 drop into both eyes daily.        Review of Systems  Constitutional:  Positive for fatigue. Negative for activity change, appetite change and fever.  HENT:  Positive for congestion and rhinorrhea. Negative for ear pain and sore throat.   Respiratory:  Positive for cough and wheezing. Negative for shortness of breath.   Cardiovascular:  Negative for chest pain.  Gastrointestinal:  Negative for nausea and vomiting.  Genitourinary:  Negative for dysuria.  Musculoskeletal:  Negative for myalgias.  Skin:  Negative for wound.  Neurological:  Positive for weakness. Negative for dizziness and headaches.  Psychiatric/Behavioral:  Positive for confusion. Negative for dysphoric mood. The patient is not nervous/anxious.     Immunization History  Administered Date(s) Administered   DTaP 09/05/2004   Fluad Quad(high Dose 65+) 02/09/2023   Influenza, High Dose Seasonal PF 01/06/2014, 11/08/2018   Influenza, Quadrivalent, Recombinant, Inj, Pf 01/09/2018, 01/21/2019   Influenza-Unspecified 11/07/2013, 01/14/2020, 01/26/2021, 01/31/2022   Moderna Covid-19 Vaccine Bivalent Booster 37yrs & up 04/04/2021   Moderna SARS-COV2 Booster Vaccination 02/08/2022   Moderna Sars-Covid-2 Vaccination 05/09/2019, 06/06/2019, 02/22/2020, 09/06/2020   PFIZER(Purple Top)SARS-COV-2 Vaccination 01/23/2023   Pneumococcal Conjugate-13 01/06/2014   Pneumococcal Polysaccharide-23 09/05/2004   Pneumococcal-Unspecified 09/05/2004, 10/18/2014   Tdap 09/05/2004, 07/19/2022   Zoster Recombinant(Shingrix) 03/21/2021, 05/24/2021   Pertinent  Health Maintenance Due  Topic Date Due   INFLUENZA VACCINE  Completed   DEXA SCAN  Completed      08/26/2021    8:30 PM 08/27/2021   10:00 AM  01/09/2022   12:36 PM 06/25/2022   12:28 PM 07/19/2022   10:41 AM  Fall Risk  Falls in the past year?   0 0 0  Was there an injury with Fall?   0 0 0  Fall Risk Category Calculator   0 0 0  Fall Risk Category (Retired)   Low    (RETIRED) Patient Fall Risk Level High fall risk High fall risk Low fall risk    Patient at Risk for Falls Due to   No Fall Risks No Fall Risks No Fall Risks  Fall risk Follow up   Falls evaluation completed Falls evaluation completed Falls evaluation completed   Functional Status Survey:    Vitals:   05/29/23 1152  BP: 100/63  Pulse: 68  Resp: 18  Temp: (!) 97.3 F (36.3 C)  SpO2: 97%  Weight: 91 lb 8 oz (41.5 kg)  Height: 4\' 10"  (1.473 m)   Body mass index is 19.12 kg/m. Physical Exam Vitals reviewed.  Constitutional:      General: She is  not in acute distress. HENT:     Head: Normocephalic.     Right Ear: Tympanic membrane normal.     Left Ear: Tympanic membrane normal.     Nose: Rhinorrhea present.     Mouth/Throat:     Mouth: Mucous membranes are moist.  Eyes:     General:        Right eye: No discharge.        Left eye: No discharge.  Cardiovascular:     Rate and Rhythm: Normal rate. Rhythm irregular.     Pulses: Normal pulses.     Heart sounds: Normal heart sounds.  Pulmonary:     Effort: Pulmonary effort is normal. No respiratory distress.     Breath sounds: Examination of the right-upper field reveals wheezing. Examination of the left-upper field reveals wheezing. Wheezing present.  Abdominal:     General: Bowel sounds are normal.     Palpations: Abdomen is soft.  Musculoskeletal:     Cervical back: Neck supple.     Right lower leg: No edema.     Left lower leg: No edema.  Lymphadenopathy:     Cervical: No cervical adenopathy.  Skin:    General: Skin is warm.     Capillary Refill: Capillary refill takes less than 2 seconds.  Neurological:     General: No focal deficit present.     Mental Status: She is alert. Mental status  is at baseline.     Motor: Weakness present.     Gait: Gait abnormal.  Psychiatric:        Mood and Affect: Mood normal.     Labs reviewed: Recent Labs    06/12/22 0630  NA 141  K 4.3  CL 104  CO2 33*  BUN 22*  CREATININE 1.0  CALCIUM 8.8   Recent Labs    06/12/22 0630  AST 23  ALT 15  ALKPHOS 76  ALBUMIN 3.7   Recent Labs    06/12/22 0630  WBC 5.3  NEUTROABS 3,593.00  HGB 14.9  HCT 45  PLT 221   Lab Results  Component Value Date   TSH 3.96 06/12/2022   Lab Results  Component Value Date   HGBA1C 5.9 (H) 11/04/2019   Lab Results  Component Value Date   CHOL 110 09/19/2021   HDL 55 09/19/2021   LDLCALC 41 09/19/2021   TRIG 56 09/19/2021   CHOLHDL 2.8 11/06/2019    Significant Diagnostic Results in last 30 days:  No results found.  Assessment/Plan 1. Influenza A (Primary) - tested + this morning - symptoms: fatigue, cough, runny nose - start Tamiflu 75 mg po BID x 7 days - start droplet precautions  2. Wheezing - noted on exam - oxygen sats> 90% on RA - start duonebs BID x 3 days    Family/ staff Communication: plan discussed with patient and nurse  Labs/tests ordered:  none

## 2023-06-12 ENCOUNTER — Encounter: Payer: Self-pay | Admitting: Nurse Practitioner

## 2023-06-12 ENCOUNTER — Non-Acute Institutional Stay (SKILLED_NURSING_FACILITY): Payer: Self-pay | Admitting: Nurse Practitioner

## 2023-06-12 DIAGNOSIS — R609 Edema, unspecified: Secondary | ICD-10-CM

## 2023-06-12 DIAGNOSIS — K219 Gastro-esophageal reflux disease without esophagitis: Secondary | ICD-10-CM

## 2023-06-12 DIAGNOSIS — J439 Emphysema, unspecified: Secondary | ICD-10-CM | POA: Diagnosis not present

## 2023-06-12 DIAGNOSIS — F339 Major depressive disorder, recurrent, unspecified: Secondary | ICD-10-CM

## 2023-06-12 DIAGNOSIS — F0390 Unspecified dementia without behavioral disturbance: Secondary | ICD-10-CM

## 2023-06-12 DIAGNOSIS — I639 Cerebral infarction, unspecified: Secondary | ICD-10-CM

## 2023-06-12 DIAGNOSIS — I4719 Other supraventricular tachycardia: Secondary | ICD-10-CM

## 2023-06-12 NOTE — Progress Notes (Signed)
 Location:  [facility] Room Number: 40A Place of Service:  SNF (31) Provider:  Escher Harr Sonia Baller, MD  Patient Care Team: Venita Sheffield, MD as PCP - General (Internal Medicine)  Extended Emergency Contact Information Primary Emergency Contact: [name],[name] Mobile Phone: [phone] Relation: Nephew Secondary Emergency Contact: [name],[name] Mobile Phone: [phone] Relation: Friend  Code Status:  DNR Goals of care: Advanced Directive information    06/12/2023   10:49 AM  Advanced Directives  Does Patient Have a Medical Advance Directive? Yes  Type of Estate agent of [place];Living will;Out of facility DNR (pink MOST or yellow form)  Does patient want to make changes to medical advance directive? No - Patient declined  Copy of Healthcare Power of Attorney in Chart? Yes - validated most recent copy scanned in chart (See row information)     Chief Complaint  Patient presents with   Medical Management of Chronic Issues    Routine visit     HPI:  Pt is a 89 y.o. female seen today for medical management of chronic diseases.      Hx of emphysema, stable.  Edema, not apparent, off Torsemide. BNP 798 08/26/21, echo 08/27/21 EF 60-65%, weight stable.              PSVT off Amiodarone 01/2020 in hospital due to prolonged QT interval. On Metoprolol bid and prn, Eliquis             GERD, no further c/o abd discomfort or pain since PPI switched from Omeprazole to Pantoprazole 10/05/22. Hgb 14.9 06/12/22             Depression, stable, off Sertraline.              CKD Bun/creat 22/1.0 06/12/22             Senile dementia since CVA, no behavioral issues. MMSE 15/30 03/20/21             11/02/19 CVA (MRI cevebral small acute infarcts in the posterior left hemisphere in the left PCA and MCA watershed area. Takes Eliquis.              Anemia, 01/21/20 Iron 44, ferritin 63, Vit B12 695, Hgb 14.9 06/12/22              Hyperlipidemia, off Atorvastatin, LDL 41 09/19/21             OA lower back, aches, ambulates with walker.   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 No pertinent surgical history.  Allergies  Allergen Reactions   Alphagan P [Brimonidine Tartrate] Other (See Comments)    Unknown reaction Listed on MAR   Azopt [Brinzolamide] Other (See Comments)    Unknown reaction Listed on MAR   Lumigan [Bimatoprost] Other (See Comments)    Unknown reaction Listed on [facility]    Outpatient Encounter Medications as of 06/12/2023  Medication Sig   apixaban (ELIQUIS) 2.5 MG TABS tablet Take 1 tablet (2.5 mg total) by mouth 2 (two) times daily.   B Complex-Folic Acid (B COMPLEX VITAMINS, W/ FA, PO) Take 1 tablet by mouth daily. Vitamin B  complex - Folic Acid 0.4 mg   Calcium Carbonate-Vitamin D 600-400 MG-UNIT tablet Take 1 tablet by mouth daily.   lactose free nutrition (BOOST) LIQD Take 237 mLs by mouth daily.   melatonin 3 MG TABS tablet Take 3 mg by mouth at bedtime.   metoprolol tartrate (LOPRESSOR) 25 MG tablet Take 12.5 mg by mouth as needed.  for SVT HOLD IF HR < 50   metoprolol tartrate (LOPRESSOR) 25 MG tablet Take 12.5 mg by mouth 2 (two) times daily. for SVT HOLD IF BP <90/60, HR<50   oseltamivir (TAMIFLU) 75 MG capsule Take 75 mg by mouth 2 (two) times daily.   pantoprazole (PROTONIX) 40 MG tablet Take 40 mg by mouth daily. DAW   Sodium Fluoride (PREVIDENT 5000 BOOSTER PLUS) 1.1 % PSTE Place 1 application. onto teeth at bedtime.   timolol (TIMOPTIC) 0.5 % ophthalmic solution Place 1 drop into both eyes daily.   No facility-administered encounter medications on file as of  06/12/2023.    Review of Systems  Unable to perform ROS: Dementia    Immunization History  Administered Date(s) Administered   DTaP 09/05/2004   Fluad Quad(high Dose 65+) 02/09/2023   Influenza, High Dose Seasonal PF 01/06/2014, 11/08/2018   Influenza, Quadrivalent, Recombinant, Inj, Pf 01/09/2018, 01/21/2019   Influenza-Unspecified 11/07/2013, 01/14/2020, 01/26/2021, 01/31/2022   Moderna Covid-19 Vaccine Bivalent Booster 59yrs & up 04/04/2021   Moderna SARS-COV2 Booster Vaccination 02/08/2022   Moderna Sars-Covid-2 Vaccination 05/09/2019, 06/06/2019, 02/22/2020, 09/06/2020   PFIZER(Purple Top)SARS-COV-2 Vaccination 01/23/2023   Pneumococcal Conjugate-13 01/06/2014   Pneumococcal Polysaccharide-23 09/05/2004   Pneumococcal-Unspecified 09/05/2004, 10/18/2014   Tdap 09/05/2004, 07/19/2022   Zoster Recombinant(Shingrix) 03/21/2021, 05/24/2021   Pertinent  Health Maintenance Due  Topic Date Due   INFLUENZA VACCINE  Completed   DEXA SCAN  Completed      08/26/2021    8:30 PM 08/27/2021   10:00 AM 01/09/2022   12:36 PM 06/25/2022   12:28 PM 07/19/2022   10:41 AM  Fall Risk  Falls in the past year?   0 0 0  Was there an injury with Fall?   0 0 0  Fall Risk Category Calculator   0 0 0  Fall Risk Category (Retired)   Low    (RETIRED) Patient Fall Risk Level High fall risk High fall risk Low fall risk    Patient at Risk for Falls Due to   No Fall Risks No Fall Risks No Fall Risks  Fall risk Follow up   Falls evaluation completed Falls evaluation completed Falls evaluation completed   Functional Status Survey:    Vitals:   06/12/23 1049  BP: 127/76  Pulse: 96  Resp: 16  Temp: (!) 96.6 F (35.9 C)  SpO2: 95%  Weight: 94 lb 9.6 oz (42.9 kg)  Height: 4\' 10"  (1.473 m)   Body mass index is 19.77 kg/m. Physical Exam Vitals and nursing note reviewed.  Constitutional:      Appearance: Normal appearance.  HENT:     Head: Normocephalic and atraumatic.     Nose: Nose normal.      Mouth/Throat:     Mouth: Mucous membranes are moist.  Eyes:     Extraocular Movements: Extraocular movements intact.     Conjunctiva/sclera: Conjunctivae normal.     Pupils: Pupils are equal, round, and reactive to light.  Cardiovascular:     Rate and Rhythm: Normal rate and regular rhythm.     Heart sounds: No murmur heard. Pulmonary:     Effort:  Pulmonary effort is normal.     Breath sounds: No rales.  Abdominal:     General: Bowel sounds are normal.     Palpations: Abdomen is soft.     Tenderness: There is no abdominal tenderness.  Musculoskeletal:     Cervical back: Normal range of motion and neck supple.     Right lower leg: No edema.     Left lower leg: No edema.  Skin:    General: Skin is warm and dry.     Comments: Hammer toes most of her toes Yellow, thick toe nails.   Neurological:     General: No focal deficit present.     Mental Status: She is alert and oriented to person, place, and time. Mental status is at baseline.     Gait: Gait abnormal.     Comments: Ambulates with walker.   Psychiatric:        Mood and Affect: Mood normal.        Behavior: Behavior normal.        Thought Content: Thought content normal.     Labs reviewed: No results for input(s): "NA", "K", "CL", "CO2", "GLUCOSE", "BUN", "CREATININE", "CALCIUM", "MG", "PHOS" in the last 8760 hours. No results for input(s): "AST", "ALT", "ALKPHOS", "BILITOT", "PROT", "ALBUMIN" in the last 8760 hours. No results for input(s): "WBC", "NEUTROABS", "HGB", "HCT", "MCV", "PLT" in the last 8760 hours. Lab Results  Component Value Date   TSH 3.96 06/12/2022   Lab Results  Component Value Date   HGBA1C 5.9 (H) 11/04/2019   Lab Results  Component Value Date   CHOL 110 09/19/2021   HDL 55 09/19/2021   LDLCALC 41 09/19/2021   TRIG 56 09/19/2021   CHOLHDL 2.8 11/06/2019    Significant Diagnostic Results in last 30 days:  No results found.  Assessment/Plan Emphysema of lung (HCC)  stable.    Edema not apparent, off Torsemide. BNP 798 08/26/21, echo 08/27/21 EF 60-65%, weight stable.   Junctional tachycardia (HCC) not apparent, off Torsemide. BNP 798 08/26/21, echo 08/27/21 EF 60-65%, weight stable. Intermittent tachycardia and bradycardia.   GERD (gastroesophageal reflux disease)  no further c/o abd discomfort or pain since PPI switched from Omeprazole to Pantoprazole 10/05/22. Hgb 14.9 06/12/22  Depression, recurrent (HCC) stable, off Sertraline.   Senile dementia (HCC)  Senile dementia since CVA, no behavioral issues. MMSE 15/30 03/20/21  Stroke (HCC)  11/02/19 CVA (MRI cevebral small acute infarcts in the posterior left hemisphere in the left PCA and MCA watershed area. Takes Eliquis  Osteoarthritis, multiple sites    OA lower back, aches, ambulates with walker.      Family/ staff Communication: plan of care reviewed with the patient and charge nurse.   Labs/tests ordered:  none

## 2023-06-12 NOTE — Assessment & Plan Note (Signed)
Senile dementia since CVA, no behavioral issues. MMSE 15/30 03/20/21 

## 2023-06-12 NOTE — Assessment & Plan Note (Signed)
no further c/o abd discomfort or pain since PPI switched from Omeprazole to Pantoprazole 10/05/22. Hgb 14.9 06/12/22

## 2023-06-12 NOTE — Assessment & Plan Note (Signed)
11/02/19 CVA (MRI cevebral small acute infarcts in the posterior left hemisphere in the left PCA and MCA watershed area. Takes Eliquis.

## 2023-06-12 NOTE — Assessment & Plan Note (Signed)
stable, off Sertraline.  

## 2023-06-12 NOTE — Assessment & Plan Note (Signed)
 stable

## 2023-06-12 NOTE — Assessment & Plan Note (Signed)
 not apparent, off Torsemide. BNP 798 08/26/21, echo 08/27/21 EF 60-65%, weight stable. Intermittent tachycardia and bradycardia.

## 2023-06-12 NOTE — Assessment & Plan Note (Signed)
not apparent, off Torsemide. BNP 798 08/26/21, echo 08/27/21 EF 60-65%, weight stable.

## 2023-06-12 NOTE — Assessment & Plan Note (Signed)
OA lower back, aches, ambulates with walker.

## 2023-06-14 ENCOUNTER — Encounter: Payer: Self-pay | Admitting: Nurse Practitioner

## 2023-07-08 ENCOUNTER — Non-Acute Institutional Stay (SKILLED_NURSING_FACILITY): Payer: Self-pay | Admitting: Sports Medicine

## 2023-07-08 DIAGNOSIS — I4892 Unspecified atrial flutter: Secondary | ICD-10-CM | POA: Diagnosis not present

## 2023-07-08 DIAGNOSIS — K219 Gastro-esophageal reflux disease without esophagitis: Secondary | ICD-10-CM | POA: Diagnosis not present

## 2023-07-08 DIAGNOSIS — R21 Rash and other nonspecific skin eruption: Secondary | ICD-10-CM | POA: Diagnosis not present

## 2023-07-08 DIAGNOSIS — F0390 Unspecified dementia without behavioral disturbance: Secondary | ICD-10-CM | POA: Diagnosis not present

## 2023-07-08 NOTE — Progress Notes (Unsigned)
 Provider:  Dr. Venita Sheffield Location:  [facility] Place of Service:   Skilled care   PCP: Venita Sheffield, MD Patient Care Team: Venita Sheffield, MD as PCP - General (Internal Medicine)  Extended Emergency Contact Information Primary Emergency Contact: [name],[name] Mobile Phone: [phone] Relation: Nephew Secondary Emergency Contact: [name],[name] Mobile Phone: [phone] Relation: Friend  Goals of Care: Advanced Directive information    06/12/2023   10:49 AM  Advanced Directives  Does Patient Have a Medical Advance Directive? Yes  Type of Estate agent of [place];Living will;Out of facility DNR (pink MOST or yellow form)  Does patient want to make changes to medical advance directive? No - Patient declined  Copy of Healthcare Power of Attorney in Chart? Yes - validated most recent copy scanned in chart (See row information)       History of Present Illness         89 yr old F with h/o Emphysema,  GERD, Depression, Dementia is evaluated for acute visit for rash   Pt seen and examined in her room  Nurse available during the visit  Pt is laying on her bed, Opens her eyes on calling her name Knows her name, not oriented to time Seems pleasant and comfortable Mostly answers in 1 word answers Staff reported that pt has red bumps on her upper back with excoriations Pt is a poor historian,  Pt denies sob, abdominal pain, nausea, vomiting, dysuria,  Past Medical History:  Diagnosis Date   Atrial flutter, paroxysmal (HCC)    COVID-19    Dementia (HCC)    Diastolic dysfunction    Hyperlipidemia    Hypertension    PSVT (paroxysmal supraventricular tachycardia) (HCC)    Stroke (HCC)    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 (HCC)    No past surgical history on file.  reports that she has never smoked. She has never used smokeless tobacco. She reports that she does  not drink alcohol and does not use drugs. Social History   Socioeconomic History   Marital status: Widowed    Spouse name: Not on file   Number of children: 0   Years of education: Not on file   Highest education level: Not on file  Occupational History   Not on file  Tobacco Use   Smoking status: Never   Smokeless tobacco: Never  Vaping Use   Vaping status: Never Used  Substance and Sexual Activity   Alcohol use: Never   Drug use: Never   Sexual activity: Not on file  Other Topics Concern   Not on file  Social History Narrative   Not on file   Social Drivers of Health   Financial Resource Strain: Not on file  Food Insecurity: No Food Insecurity (08/16/2021)   Hunger Vital Sign    Worried About Running Out of Food in the Last Year: Never true    Ran Out of Food in the Last Year: Never true  Transportation Needs: Not on file  Physical Activity: Not on file  Stress: Not on file  Social Connections: Not on file  Intimate Partner Violence: Not on file    Functional Status Survey:    No family history on file.  Health Maintenance  Topic Date Due   COVID-19 Vaccine (8 - 2024-25 season) 03/20/2023   DTaP/Tdap/Td (4 - Td or Tdap) 07/18/2032   Pneumonia Vaccine 83+ Years old  Completed   INFLUENZA VACCINE  Completed   DEXA SCAN  Completed   Zoster Vaccines- Shingrix  Completed   HPV VACCINES  Aged Out    Allergies  Allergen Reactions   Alphagan P [Brimonidine Tartrate] Other (See Comments)    Unknown reaction Listed on MAR   Azopt [Brinzolamide] Other (See Comments)    Unknown reaction Listed on MAR   Lumigan [Bimatoprost] Other (See Comments)    Unknown reaction Listed on [facility]    Outpatient Encounter Medications as of 07/08/2023  Medication Sig   apixaban (ELIQUIS) 2.5 MG TABS tablet Take 1 tablet (2.5 mg total) by mouth 2 (two) times daily.   B Complex-Folic Acid (B COMPLEX VITAMINS, W/ FA, PO) Take 1 tablet by mouth daily. Vitamin B complex - Folic Acid  0.4 mg   Calcium Carbonate-Vitamin D 600-400 MG-UNIT tablet Take 1 tablet by mouth daily.   lactose free nutrition (BOOST) LIQD Take 237 mLs by mouth daily.   melatonin 3 MG TABS tablet Take 3 mg by mouth at bedtime.   metoprolol tartrate (LOPRESSOR) 25 MG tablet Take 12.5 mg by mouth as needed.  for SVT HOLD IF HR < 50   metoprolol tartrate (LOPRESSOR) 25 MG tablet Take 12.5 mg by mouth 2 (two) times daily. for SVT HOLD IF BP <90/60, HR<50   oseltamivir (TAMIFLU) 75 MG capsule Take 75 mg by mouth 2 (two) times daily.   pantoprazole (PROTONIX) 40 MG tablet Take 40 mg by mouth daily. DAW   Sodium Fluoride (PREVIDENT 5000 BOOSTER PLUS) 1.1 % PSTE Place 1 application. onto teeth at bedtime.   timolol (TIMOPTIC) 0.5 % ophthalmic solution Place 1 drop into both eyes daily.   No facility-administered encounter medications on file as of 07/08/2023.    Review of Systems  Unable to perform ROS: Dementia  Constitutional:  Negative for fever.  Respiratory:  Negative for cough and shortness of breath.   Cardiovascular:  Negative for leg swelling.  Gastrointestinal:  Negative for abdominal pain, nausea and vomiting.  Genitourinary:  Negative for dysuria.  Psychiatric/Behavioral:  Negative for agitation and behavioral problems.    Negative unless indicated in HPI.  There were no vitals filed for this visit. There is no height or weight on file to calculate BMI. BP Readings from Last 3 Encounters:  06/12/23 127/76  05/29/23 100/63  05/17/23 (!) 103/58   Wt Readings from Last 3 Encounters:  06/12/23 94 lb 9.6 oz (42.9 kg)  05/29/23 91 lb 8 oz (41.5 kg)  05/17/23 91 lb 8 oz (41.5 kg)   Physical Exam Constitutional:      Appearance: Normal appearance.  HENT:     Head: Normocephalic.  Cardiovascular:     Rate and Rhythm: Normal rate and regular rhythm.  Pulmonary:     Effort: Pulmonary effort is normal. No respiratory distress.     Breath sounds: Normal breath sounds. No wheezing.   Abdominal:     General: Bowel sounds are normal. There is no distension.     Tenderness: There is no abdominal tenderness. There is no guarding or rebound.     Comments:    Musculoskeletal:        General: No swelling or tenderness.  Skin:    Comments: Raised erythematous rash on her upper and lower back bilaterally  Neurological:     Mental Status: She is alert. Mental status is at baseline.     Motor: No weakness.     Labs reviewed: Basic Metabolic Panel: No results for input(s): "NA", "K", "CL", "CO2", "GLUCOSE", "BUN", "CREATININE", "CALCIUM", "MG", "  PHOS" in the last 8760 hours. Liver Function Tests: No results for input(s): "AST", "ALT", "ALKPHOS", "BILITOT", "PROT", "ALBUMIN" in the last 8760 hours. No results for input(s): "LIPASE", "AMYLASE" in the last 8760 hours. No results for input(s): "AMMONIA" in the last 8760 hours. CBC: No results for input(s): "WBC", "NEUTROABS", "HGB", "HCT", "MCV", "PLT" in the last 8760 hours. Cardiac Enzymes: No results for input(s): "CKTOTAL", "CKMB", "CKMBINDEX", "TROPONINI" in the last 8760 hours. BNP: Invalid input(s): "POCBNP" Lab Results  Component Value Date   HGBA1C 5.9 (H) 11/04/2019   Lab Results  Component Value Date   TSH 3.96 06/12/2022   Lab Results  Component Value Date   VITAMINB12 695 01/21/2020   No results found for: "FOLATE" Lab Results  Component Value Date   IRON 44 01/21/2020   TIBC 273 01/21/2020   FERRITIN 63 01/21/2020    Imaging and Procedures obtained prior to SNF admission: ECHOCARDIOGRAM COMPLETE Result Date: 08/27/2021    ECHOCARDIOGRAM REPORT   Patient Name:   Bianca [name] Date of Exam: 08/27/2021 Medical Rec #:  [id number]        Height:       58.0 in Accession #:    [id number]       Weight:       114.9 lb Date of Birth:  24-Mar-1923         BSA:          1.439 m Patient Age:    89 years         BP:           103/92 mmHg Patient Gender: F                HR:           124 bpm. Exam Location:   Inpatient Procedure: 2D Echo, Cardiac Doppler and Color Doppler Indications:    CHF  History:        Patient has prior history of Echocardiogram examinations, most                 recent 11/05/2019. Arrythmias:Tachycardia.  Sonographer:    Eduard Roux Referring Phys: [id number] RAVI PAHWANI IMPRESSIONS  1. Left ventricular ejection fraction, by estimation, is 60 to 65%. The left ventricle has normal function. The left ventricle has no regional wall motion abnormalities. Indeterminate diastolic filling due to E-A fusion.  2. Right ventricular systolic function is moderately reduced. The right ventricular size is mildly enlarged. There is moderately elevated pulmonary artery systolic pressure.  3. Left atrial size was mildly dilated.  4. Right atrial size was mildly dilated.  5. The mitral valve is normal in structure. Trivial mitral valve regurgitation. No evidence of mitral stenosis.  6. Tricuspid valve regurgitation is severe.  7. The aortic valve is tricuspid. Aortic valve regurgitation is mild. No aortic stenosis is present.  8. The inferior vena cava is dilated in size with <50% respiratory variability, suggesting right atrial pressure of 15 mmHg. Comparison(s): No significant change from prior study. Prior images reviewed side by side. The right ventricular hypertrophy is worse. Systolic PA pressure is higher and tricuspid insufficiency is worse. FINDINGS  Left Ventricle: Left ventricular ejection fraction, by estimation, is 60 to 65%. The left ventricle has normal function. The left ventricle has no regional wall motion abnormalities. The left ventricular internal cavity size was normal in size. There is  no left ventricular hypertrophy. Indeterminate diastolic filling due to E-A fusion. Right Ventricle: The right ventricular size is mildly  enlarged. No increase in right ventricular wall thickness. Right ventricular systolic function is moderately reduced. There is moderately elevated pulmonary artery  systolic pressure. The tricuspid regurgitant velocity is 3.23 m/s, and with an assumed right atrial pressure of 15 mmHg, the estimated right ventricular systolic pressure is 56.7 mmHg. Left Atrium: Left atrial size was mildly dilated. Right Atrium: Right atrial size was mildly dilated. Pericardium: There is no evidence of pericardial effusion. Mitral Valve: The mitral valve is normal in structure. Trivial mitral valve regurgitation. No evidence of mitral valve stenosis. Tricuspid Valve: The tricuspid valve is normal in structure. Tricuspid valve regurgitation is severe. No evidence of tricuspid stenosis. Aortic Valve: The aortic valve is tricuspid. Aortic valve regurgitation is mild. No aortic stenosis is present. Aortic valve peak gradient measures 3.8 mmHg. Pulmonic Valve: The pulmonic valve was normal in structure. Pulmonic valve regurgitation is trivial. No evidence of pulmonic stenosis. Aorta: The aortic root is normal in size and structure. Venous: The inferior vena cava is dilated in size with less than 50% respiratory variability, suggesting right atrial pressure of 15 mmHg. The inferior vena cava and the hepatic vein show a pattern of systolic flow reversal, suggestive of tricuspid regurgitation. IAS/Shunts: No atrial level shunt detected by color flow Doppler.  LEFT VENTRICLE PLAX 2D LVIDd:         2.70 cm LVIDs:         2.05 cm LV PW:         1.10 cm LV IVS:        1.10 cm LVOT diam:     1.80 cm LV SV:         32 LV SV Index:   22 LVOT Area:     2.54 cm  RIGHT VENTRICLE            IVC RV Basal diam:  4.10 cm    IVC diam: 2.20 cm RV Mid diam:    3.70 cm RV S prime:     6.39 cm/s LEFT ATRIUM             Index        RIGHT ATRIUM           Index LA diam:        3.90 cm 2.71 cm/m   RA Area:     18.20 cm LA Vol (A2C):   39.8 ml 27.66 ml/m  RA Volume:   53.00 ml  36.83 ml/m LA Vol (A4C):   34.9 ml 24.25 ml/m LA Biplane Vol: 38.6 ml 26.83 ml/m  AORTIC VALVE                 PULMONIC VALVE AV Area (Vmax):  2.00 cm     PV Vmax:       0.60 m/s AV Vmax:        97.70 cm/s   PV Peak grad:  1.4 mmHg AV Peak Grad:   3.8 mmHg LVOT Vmax:      76.90 cm/s LVOT Vmean:     45.200 cm/s LVOT VTI:       0.125 m  AORTA Ao Root diam: 2.90 cm Ao Asc diam:  2.80 cm TRICUSPID VALVE TR Peak grad:   41.7 mmHg TR Vmax:        323.00 cm/s  SHUNTS Systemic VTI:  0.12 m Systemic Diam: 1.80 cm Rachelle Hora Croitoru MD Electronically signed by Thurmon Fair MD Signature Date/Time: 08/27/2021/12:15:15 PM    Final    DG Chest 2 View Result  Date: 08/26/2021 CLINICAL DATA:  Chest pain.  Bradycardia. EXAM: CHEST - 2 VIEW COMPARISON:  08/13/2021 FINDINGS: Moderate cardiomegaly remains stable. New small right pleural effusion. Increased diffuse interstitial prominence suspicious for mild pulmonary edema. No evidence of pulmonary consolidation. IMPRESSION: Mild congestive heart failure with small right pleural effusion. Electronically Signed   By: Danae Orleans M.D.   On: 08/26/2021 10:14    Assessment and Plan      1. Rash (Primary)  Pt noted to have red bumps on her upper and lowe back Will start prednisone 40 mg daily  Will start claritin Monitor for any worsening symptoms  2. Major neurocognitive disorder (HCC)  Cont with supportive care  3. Paroxysmal atrial flutter (HCC)  Rate controlled Cont with eliquis, metoprolol   4. Gastroesophageal reflux disease, unspecified whether esophagitis present         30 min Total time spent for obtaining history,  performing a medically appropriate examination and evaluation, reviewing the tests, documenting clinical information in the electronic or other health record,   ,care coordination (not separately reported)

## 2023-07-10 ENCOUNTER — Encounter: Payer: Self-pay | Admitting: Sports Medicine

## 2023-07-17 ENCOUNTER — Encounter: Payer: Self-pay | Admitting: Nurse Practitioner

## 2023-07-17 ENCOUNTER — Non-Acute Institutional Stay (SKILLED_NURSING_FACILITY): Payer: Self-pay | Admitting: Nurse Practitioner

## 2023-07-17 DIAGNOSIS — I471 Supraventricular tachycardia, unspecified: Secondary | ICD-10-CM

## 2023-07-17 DIAGNOSIS — K219 Gastro-esophageal reflux disease without esophagitis: Secondary | ICD-10-CM

## 2023-07-17 DIAGNOSIS — F0390 Unspecified dementia without behavioral disturbance: Secondary | ICD-10-CM

## 2023-07-17 NOTE — Assessment & Plan Note (Signed)
off Amiodarone 01/2020 in hospital due to prolonged QT interval. On Metoprolol bid and prn,  Eliquis.  

## 2023-07-17 NOTE — Assessment & Plan Note (Signed)
no further c/o abd discomfort or pain since PPI switched from Omeprazole to Pantoprazole 10/05/22. Hgb 14.9 06/12/22

## 2023-07-17 NOTE — Assessment & Plan Note (Signed)
 Senile dementia since CVA, no behavioral issues. MMSE 15/30 03/20/21             11/02/19 CVA (MRI cevebral small acute infarcts in the posterior left hemisphere in the left PCA and MCA watershed area. Takes Eliquis.

## 2023-07-17 NOTE — Progress Notes (Unsigned)
 Location:   [facility] Room Number: 40A Place of Service:  SNF (31) Provider: Arna Snipe Deaundra Dupriest NP  Venita Sheffield, MD  Patient Care Team: Venita Sheffield, MD as PCP - General (Internal Medicine)  Extended Emergency Contact Information Primary Emergency Contact: [name],[name] Mobile Phone: [phone] Relation: Nephew Secondary Emergency Contact: [name],[name] Mobile Phone: [phone] Relation: Friend  Code Status:  DNR Goals of care: Advanced Directive information    06/12/2023   10:49 AM  Advanced Directives  Does Patient Have a Medical Advance Directive? Yes  Type of Estate agent of [place];Living will;Out of facility DNR (pink MOST or yellow form)  Does patient want to make changes to medical advance directive? No - Patient declined  Copy of Healthcare Power of Attorney in Chart? Yes - validated most recent copy scanned in chart (See row information)     Chief Complaint  Patient presents with  . Medical Management of Chronic Issues    HPI:  Pt is a 89 y.o. female seen today for medical management of chronic diseases.    Rash on her back, resolved, treated with Prednisone/Claritin.   Hx of emphysema, stable.  Edema, not apparent, off Torsemide. BNP 798 08/26/21, echo 08/27/21 EF 60-65%, weight stable.              PSVT off Amiodarone 01/2020 in hospital due to prolonged QT interval. On Metoprolol bid and prn, Eliquis             GERD, no further c/o abd discomfort or pain since PPI switched from Omeprazole to Pantoprazole 10/05/22. Hgb 14.9 06/12/22             Depression, stable, off Sertraline.              CKD Bun/creat 22/1.0 06/12/22             Senile dementia since CVA, no behavioral issues. MMSE 15/30 03/20/21             11/02/19 CVA (MRI cevebral small acute infarcts in the posterior left hemisphere in the left PCA and MCA watershed area. Takes Eliquis.              Anemia, 01/21/20 Iron 44, ferritin 63, Vit B12 695, Hgb  14.9 06/12/22             Hyperlipidemia, off Atorvastatin, LDL 41 09/19/21             OA lower back, aches, ambulates with walker.     Past Medical History:  Diagnosis Date  . Atrial flutter, paroxysmal (HCC)   . COVID-19   . Dementia (HCC)   . Diastolic dysfunction   . Hyperlipidemia   . Hypertension   . PSVT (paroxysmal supraventricular tachycardia) (HCC)   . Stroke (HCC)   . 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 No pertinent surgical history.  Allergies  Allergen Reactions  . Alphagan P [Brimonidine Tartrate] Other (See Comments)    Unknown reaction Listed on MAR  . Azopt [Brinzolamide] Other (See Comments)    Unknown reaction Listed on MAR  . Lumigan [Bimatoprost] Other (See Comments)    Unknown reaction Listed on MAR    Allergies as of 07/17/2023       Reactions   Alphagan P [brimonidine Tartrate] Other (See Comments)   Unknown reaction Listed on MAR   Azopt [brinzolamide] Other (See Comments)   Unknown reaction Listed on MAR   Lumigan [  bimatoprost] Other (See Comments)   Unknown reaction Listed on [facility]        Medication List        Accurate as of July 17, 2023  3:39 PM. If you have any questions, ask your nurse or doctor.          apixaban 2.5 MG Tabs tablet Commonly known as: ELIQUIS Take 1 tablet (2.5 mg total) by mouth 2 (two) times daily.   B COMPLEX VITAMINS (W/ FA) PO Take 1 tablet by mouth daily. Vitamin B complex - Folic Acid 0.4 mg   Calcium Carbonate-Vitamin D 600-400 MG-UNIT tablet Take 1 tablet by mouth daily.   lactose free nutrition Liqd Take 237 mLs by mouth daily.   melatonin 3 MG Tabs tablet Take 3 mg by mouth at bedtime.   metoprolol tartrate 25 MG tablet Commonly known as: LOPRESSOR Take 12.5 mg by mouth as needed.  for SVT HOLD IF HR < 50   metoprolol tartrate 25 MG tablet Commonly known as: LOPRESSOR Take 12.5 mg by mouth 2 (two) times  daily. for SVT HOLD IF BP <90/60, HR<50   oseltamivir 75 MG capsule Commonly known as: TAMIFLU Take 75 mg by mouth 2 (two) times daily.   pantoprazole 40 MG tablet Commonly known as: PROTONIX Take 40 mg by mouth daily. DAW   PreviDent 5000 Booster Plus 1.1 % Pste Generic drug: Sodium Fluoride Place 1 application. onto teeth at bedtime.   timolol 0.5 % ophthalmic solution Commonly known as: TIMOPTIC Place 1 drop into both eyes daily.        Review of Systems  Unable to perform ROS: Dementia    Immunization History  Administered Date(s) Administered  . DTaP 09/05/2004  . Fluad Quad(high Dose 65+) 02/09/2023  . Influenza, High Dose Seasonal PF 01/06/2014, 11/08/2018  . Influenza, Quadrivalent, Recombinant, Inj, Pf 01/09/2018, 01/21/2019  . Influenza-Unspecified 11/07/2013, 01/14/2020, 01/26/2021, 01/31/2022  . Moderna Covid-19 Vaccine Bivalent Booster 42yrs & up 04/04/2021  . Moderna SARS-COV2 Booster Vaccination 02/08/2022  . Moderna Sars-Covid-2 Vaccination 05/09/2019, 06/06/2019, 02/22/2020, 09/06/2020  . PFIZER(Purple Top)SARS-COV-2 Vaccination 01/23/2023  . Pneumococcal Conjugate-13 01/06/2014  . Pneumococcal Polysaccharide-23 09/05/2004  . Pneumococcal-Unspecified 09/05/2004, 10/18/2014  . Tdap 09/05/2004, 07/19/2022  . Zoster Recombinant(Shingrix) 03/21/2021, 05/24/2021   Pertinent  Health Maintenance Due  Topic Date Due  . INFLUENZA VACCINE  11/08/2023  . DEXA SCAN  Completed      08/26/2021    8:30 PM 08/27/2021   10:00 AM 01/09/2022   12:36 PM 06/25/2022   12:28 PM 07/19/2022   10:41 AM  Fall Risk  Falls in the past year?   0 0 0  Was there an injury with Fall?   0 0 0  Fall Risk Category Calculator   0 0 0  Fall Risk Category (Retired)   Low    (RETIRED) Patient Fall Risk Level High fall risk High fall risk Low fall risk    Patient at Risk for Falls Due to   No Fall Risks No Fall Risks No Fall Risks  Fall risk Follow up   Falls evaluation completed  Falls evaluation completed Falls evaluation completed   Functional Status Survey:    Vitals:   07/17/23 1530  BP: (!) 128/56  Pulse: 65  Resp: 16  Temp: (!) 97.1 F (36.2 C)  SpO2: 96%  Weight: 93 lb 12.8 oz (42.5 kg)   Body mass index is 19.6 kg/m. Physical Exam Vitals and nursing note reviewed.  Constitutional:  Appearance: Normal appearance.  HENT:     Head: Normocephalic and atraumatic.     Nose: Nose normal.     Mouth/Throat:     Mouth: Mucous membranes are moist.  Eyes:     Extraocular Movements: Extraocular movements intact.     Conjunctiva/sclera: Conjunctivae normal.     Pupils: Pupils are equal, round, and reactive to light.  Cardiovascular:     Rate and Rhythm: Normal rate and regular rhythm.     Heart sounds: No murmur heard. Pulmonary:     Effort: Pulmonary effort is normal.     Breath sounds: No rales.  Abdominal:     General: Bowel sounds are normal.     Palpations: Abdomen is soft.     Tenderness: There is no abdominal tenderness.  Musculoskeletal:     Cervical back: Normal range of motion and neck supple.     Right lower leg: No edema.     Left lower leg: No edema.  Skin:    General: Skin is warm and dry.     Comments: Hammer toes most of her toes Yellow, thick toe nails.   Neurological:     General: No focal deficit present.     Mental Status: She is alert and oriented to person, place, and time. Mental status is at baseline.     Gait: Gait abnormal.     Comments: Ambulates with walker.   Psychiatric:        Mood and Affect: Mood normal.        Behavior: Behavior normal.        Thought Content: Thought content normal.    Labs reviewed: No results for input(s): "NA", "K", "CL", "CO2", "GLUCOSE", "BUN", "CREATININE", "CALCIUM", "MG", "PHOS" in the last 8760 hours. No results for input(s): "AST", "ALT", "ALKPHOS", "BILITOT", "PROT", "ALBUMIN" in the last 8760 hours. No results for input(s): "WBC", "NEUTROABS", "HGB", "HCT", "MCV", "PLT"  in the last 8760 hours. Lab Results  Component Value Date   TSH 3.96 06/12/2022   Lab Results  Component Value Date   HGBA1C 5.9 (H) 11/04/2019   Lab Results  Component Value Date   CHOL 110 09/19/2021   HDL 55 09/19/2021   LDLCALC 41 09/19/2021   TRIG 56 09/19/2021   CHOLHDL 2.8 11/06/2019    Significant Diagnostic Results in last 30 days:  No results found.  Assessment/Plan  Paroxysmal SVT (supraventricular tachycardia) (HCC) off Amiodarone 01/2020 in hospital due to prolonged QT interval. On Metoprolol bid and prn, Eliquis  GERD (gastroesophageal reflux disease) no further c/o abd discomfort or pain since PPI switched from Omeprazole to Pantoprazole 10/05/22. Hgb 14.9 06/12/22  Senile dementia (HCC)  Senile dementia since CVA, no behavioral issues. MMSE 15/30 03/20/21             11/02/19 CVA (MRI cevebral small acute infarcts in the posterior left hemisphere in the left PCA and MCA watershed area. Takes Eliquis.    Family/ staff Communication: ***  Labs/tests ordered:  ***

## 2023-08-05 ENCOUNTER — Non-Acute Institutional Stay: Payer: Self-pay | Admitting: Nurse Practitioner

## 2023-08-05 ENCOUNTER — Encounter: Payer: Self-pay | Admitting: Nurse Practitioner

## 2023-08-05 DIAGNOSIS — Z0000 Encounter for general adult medical examination without abnormal findings: Secondary | ICD-10-CM | POA: Diagnosis not present

## 2023-08-05 NOTE — Progress Notes (Unsigned)
 Subjective:   Bianca [name] is a 89 y.o. female who presents for Medicare Annual (Subsequent) preventive examination.  Visit Complete: In person  Patient Medicare AWV questionnaire was completed by the patient on 08/05/23; I have confirmed that all information answered by patient is correct and no changes since this date.  Cardiac Risk Factors include: advanced age (>90men, >69 women);dyslipidemia;hypertension     Objective:    Today's Vitals   08/05/23 1319  BP: (!) 121/50  Pulse: 61  Resp: 16  Temp: (!) 97.1 F (36.2 C)  SpO2: 96%  Weight: 93 lb 12.8 oz (42.5 kg)  Height: 4\' 10"  (1.473 m)  PainSc: 0-No pain   Body mass index is 19.6 kg/m.     08/05/2023    1:31 PM 06/12/2023   10:49 AM 05/29/2023   11:57 AM 12/18/2022   10:01 AM 12/06/2022   10:03 AM 10/18/2022    8:58 AM 07/19/2022   10:41 AM  Advanced Directives  Does Patient Have a Medical Advance Directive? Yes Yes Yes Yes Yes Yes Yes  Type of Estate agent of [place];Living will;Out of facility DNR (pink MOST or yellow form) Healthcare Power of [place];Living will;Out of facility DNR (pink MOST or yellow form) Healthcare Power of [place];Living will;Out of facility DNR (pink MOST or yellow form) Healthcare Power of [place];Out of facility DNR (pink MOST or yellow form);Living will Healthcare Power of [place];Out of facility DNR (pink MOST or yellow form);Living will Healthcare Power of [place];Living will;Out of facility DNR (pink MOST or yellow form) Healthcare Power of [place];Living will;Out of facility DNR (pink MOST or yellow form)  Does patient want to make changes to medical advance directive? No - Patient declined No - Patient declined No - Patient declined No - Patient declined No - Patient declined No - Patient declined No - Patient declined  Copy of Healthcare Power of Attorney in Chart? Yes - validated most recent copy scanned in chart (See row information) Yes - validated most  recent copy scanned in chart (See row information) Yes - validated most recent copy scanned in chart (See row information) Yes - validated most recent copy scanned in chart (See row information) Yes - validated most recent copy scanned in chart (See row information) Yes - validated most recent copy scanned in chart (See row information) Yes - validated most recent copy scanned in chart (See row information)  Pre-existing out of facility DNR order (yellow form or pink MOST form) Pink MOST form placed in chart (order not valid for inpatient use);Yellow form placed in chart (order not valid for inpatient use)   Yellow form placed in chart (order not valid for inpatient use);Pink MOST form placed in chart (order not valid for inpatient use) Yellow form placed in chart (order not valid for inpatient use);Pink MOST form placed in chart (order not valid for inpatient use) Pink MOST form placed in chart (order not valid for inpatient use);Yellow form placed in chart (order not valid for inpatient use) Pink MOST form placed in chart (order not valid for inpatient use);Yellow form placed in chart (order not valid for inpatient use)    Current Medications (verified) Outpatient Encounter Medications as of 08/05/2023  Medication Sig   apixaban  (ELIQUIS ) 2.5 MG TABS tablet Take 1 tablet (2.5 mg total) by mouth 2 (two) times daily.   B Complex-Folic Acid (B COMPLEX VITAMINS, W/ FA, PO) Take 1 tablet by mouth daily. Vitamin B complex - Folic Acid 0.4 mg   CALAMINE  EX Apply 1 Application topically in the morning and at bedtime.   Calcium  Carbonate-Vitamin D 600-400 MG-UNIT tablet Take 1 tablet by mouth daily.   lactose free nutrition (BOOST) LIQD Take 237 mLs by mouth daily.   melatonin 3 MG TABS tablet Take 3 mg by mouth at bedtime.   metoprolol  tartrate (LOPRESSOR ) 25 MG tablet Take 12.5 mg by mouth as needed.  for SVT HOLD IF HR < 50   metoprolol  tartrate (LOPRESSOR ) 25 MG tablet Take 12.5 mg by mouth 2 (two) times  daily. for SVT HOLD IF BP <90/60, HR<50   pantoprazole  (PROTONIX ) 40 MG tablet Take 40 mg by mouth daily. DAW   Sodium Fluoride (PREVIDENT 5000 BOOSTER PLUS) 1.1 % PSTE Place 1 application. onto teeth at bedtime.   timolol  (TIMOPTIC ) 0.5 % ophthalmic solution Place 1 drop into both eyes daily.   oseltamivir (TAMIFLU) 75 MG capsule Take 75 mg by mouth 2 (two) times daily. (Patient not taking: Reported on 08/05/2023)   No facility-administered encounter medications on file as of 08/05/2023.    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 No pertinent surgical history. History reviewed. No pertinent family history. Social History   Socioeconomic History   Marital status: Widowed    Spouse name: Not on file   Number of children: 0   Years of education: Not on file   Highest education level: Not on file  Occupational History   Not on file  Tobacco Use   Smoking status: Never   Smokeless tobacco: Never  Vaping Use   Vaping status: Never Used  Substance and Sexual Activity   Alcohol use: Never   Drug use: Never   Sexual activity: Not on file  Other Topics Concern   Not on file  Social History Narrative   Not on file   Social Drivers of Health   Financial Resource Strain: Not on file  Food Insecurity: No Food Insecurity (08/16/2021)   Hunger Vital Sign    Worried About Running Out of Food in the Last Year: Never true    Ran Out of Food in the Last Year: Never true  Transportation Needs: Not on file  Physical Activity: Not on file  Stress: Not on file   Social Connections: Not on file    Tobacco Counseling Counseling given: Not Answered   Clinical Intake:  Pre-visit preparation completed: Yes  Pain : No/denies pain Pain Score: 0-No pain     BMI - recorded: 19.61 Nutritional Status: BMI of 19-24  Normal Nutritional Risks: None Diabetes: No  How often do you need to have someone help you when you read instructions, pamphlets, or other written materials from your doctor or pharmacy?: 5 - Always What is the last grade level you completed in school?: college  Interpreter Needed?: No  Information entered by :: Tyeasha Ebbs Daylene Evangelist NP   Activities of Daily Living    08/05/2023    1:32 PM 08/05/2023    1:31 PM  In your present state of health, do you have any difficulty performing the following activities:  Hearing?  1  Comment  hearing aids  Vision?  0  Difficulty concentrating or making decisions?  1  Walking or climbing stairs? 1 1  Comment ambulates with walker with SA,  wheechair to go further   Dressing or bathing?  1  Doing errands, shopping?  1  Preparing Food and eating ?  N  Using the Toilet?  Y  In the past six months, have you accidently leaked urine?  Y  Do you have problems with loss of bowel control?  N  Managing your Medications?  Y  Managing your Finances?  Y  Housekeeping or managing your Housekeeping?  Y    Patient Care Team: Tye Gall, MD as PCP - General (Internal Medicine)  Indicate any recent Medical Services you may have received from other than [facility] providers in the past year (date may be approximate).     Assessment:   This is a routine wellness examination for [facility].  Hearing/Vision screen No results found.   Goals Addressed             This Visit's Progress    Maintain Mobility and Function       Evidence-based guidance:  Acknowledge and validate impact of pain, loss of strength and potential disfigurement (hand osteoarthritis) on mental health and daily life, such as  social isolation, anxiety, depression, impaired sexual relationship and   injury from falls.  Anticipate referral to physical or occupational therapy for assessment, therapeutic exercise and recommendation for adaptive equipment or assistive devices; encourage participation.  Assess impact on ability to perform activities of daily living, as well as engage in sports and leisure events or requirements of work or school.  Provide anticipatory guidance and reassurance about the benefit of exercise to maintain function; acknowledge and normalize fear that exercise may worsen symptoms.  Encourage regular exercise, at least 10 minutes at a time for 45 minutes per week; consider yoga, water exercise and proprioceptive exercises; encourage use of wearable activity tracker to increase motivation and adherence.  Encourage maintenance or resumption of daily activities, including employment, as pain allows and with minimal exposure to trauma.  Assist patient to advocate for adaptations to the work environment.  Consider level of pain and function, gender, age, lifestyle, patient preference, quality of life, readiness and ?ocapacity to benefit? when recommending patients for orthopaedic surgery consultation.  Explore strategies, such as changes to medication regimen or activity that enables patient to anticipate and manage flare-ups that increase deconditioning and disability.  Explore patient preferences; encourage exposure to a broader range of activities that have been avoided for fear of experiencing pain.  Identify barriers to participation in therapy or exercise, such as pain with activity, anticipated or imagined pain.  Monitor postoperative joint replacement or any preexisting joint replacement for ongoing pain and loss of function; provide social support and encouragement throughout recovery.   Notes:        Depression Screen    08/05/2023    1:34 PM 07/19/2022   10:41 AM 06/25/2022   12:28 PM   PHQ 2/9 Scores  PHQ - 2 Score 0 0 0    Fall Risk    07/19/2022   10:41 AM 06/25/2022   12:28 PM 01/09/2022   12:36 PM 05/19/2019    1:43 PM 05/14/2019    3:06 PM  Fall Risk   Falls in the past year? 0 0 0 0 0  Number falls in past yr: 0 0 0 0 0  Injury with Fall? 0  0 0 0   Risk for fall due to : No Fall Risks No Fall Risks No Fall Risks    Follow up Falls evaluation completed Falls evaluation completed Falls evaluation completed      MEDICARE RISK AT HOME: Medicare Risk at Home Any stairs in or around the home?: Yes If so, are there any without handrails?: No Home free of loose throw rugs in walkways, pet beds, electrical cords, etc?: Yes Adequate lighting in your home to reduce risk of falls?: Yes Life alert?: No Use of a cane, walker or w/c?: Yes Grab bars in the bathroom?: Yes Shower chair or bench in shower?: Yes Elevated toilet seat or a handicapped toilet?: Yes  TIMED UP AND GO:  Was the test performed?  Yes  Length of time to ambulate 10 feet: 15 sec Gait unsteady with use of assistive device, provider informed and education provided.     Cognitive Function:    08/05/2023    1:37 PM 03/25/2021    9:37 PM 12/28/2019    9:00 AM  MMSE - Mini Mental State Exam  Not completed: Refused    Orientation to time  1 2  Orientation to Place  1 4  Registration  3 3  Attention/ Calculation  1 1  Recall  1 0  Language- name 2 objects  1 2  Language- repeat  1 1  Language- follow 3 step command  3 3  Language- read & follow direction  1 1  Write a sentence  1 1  Copy design  1 0  Total score  15 18        Immunizations Immunization History  Administered Date(s) Administered   DTaP 09/05/2004   Fluad Quad(high Dose 65+) 02/09/2023   Influenza, High Dose Seasonal PF 01/06/2014, 11/08/2018   Influenza, Quadrivalent, Recombinant, Inj, Pf 01/09/2018, 01/21/2019   Influenza-Unspecified 11/07/2013, 01/14/2020, 01/26/2021, 01/31/2022   Moderna Covid-19 Vaccine  Bivalent  Booster 58yrs & up 04/04/2021   Moderna SARS-COV2 Booster Vaccination 02/08/2022   Moderna Sars-Covid-2 Vaccination 05/09/2019, 06/06/2019, 02/22/2020, 09/06/2020   PFIZER(Purple Top)SARS-COV-2 Vaccination 01/23/2023   Pneumococcal Conjugate-13 01/06/2014   Pneumococcal Polysaccharide-23 09/05/2004   Pneumococcal-Unspecified 09/05/2004, 10/18/2014   Tdap 09/05/2004, 07/19/2022   Zoster Recombinant(Shingrix) 03/21/2021, 05/24/2021    TDAP status: Up to date  Flu Vaccine status: Up to date  Pneumococcal vaccine status: Up to date  Covid-19 vaccine status: Completed vaccines  Qualifies for Shingles Vaccine? Yes   Zostavax completed Yes   Shingrix Completed?: Yes  Screening Tests Health Maintenance  Topic Date Due   COVID-19 Vaccine (8 - 2024-25 season) 12/09/2023 (Originally 03/20/2023)   INFLUENZA VACCINE  11/08/2023   DTaP/Tdap/Td (4 - Td or Tdap) 07/18/2032   Pneumonia Vaccine 27+ Years old  Completed   DEXA SCAN  Completed   Zoster Vaccines- Shingrix  Completed   HPV VACCINES  Aged Out   Meningococcal B Vaccine  Aged Out    Health Maintenance  There are no preventive care reminders to display for this patient.   Colorectal cancer screening: No longer required.   Mammogram status: No longer required due to aged out.  DEXA HPOA/patient declined.   Lung Cancer Screening: (Low Dose CT Chest recommended if Age 95-80 years, 20 pack-year currently smoking OR have quit w/in 15years.) does not qualify.   Lung Cancer Screening Referral: NA  Additional Screening:  Hepatitis C Screening: does not qualify  Vision Screening: Recommended annual ophthalmology exams for early detection of glaucoma and other disorders of  the eye. Is the patient up to date with their annual eye exam?  No  Who is the provider or what is the name of the office in which the patient attends annual eye exams? HPOA/patient declined.  If pt is not established with a provider, would they like to be  referred to a provider to establish care? No .   Dental Screening: Recommended annual dental exams for proper oral hygiene  Diabetic Foot Exam: NA  Community Resource Referral / Chronic Care Management: CRR required this visit?  No   CCM required this visit?  No     Plan:     I have personally reviewed and noted the following in the patient's chart:   Medical and social history Use of alcohol, tobacco or illicit drugs  Current medications and supplements including opioid prescriptions. Patient is not currently taking opioid prescriptions. Functional ability and status Nutritional status Physical activity Advanced directives List of other physicians Hospitalizations, surgeries, and ER visits in previous 12 months Vitals Screenings to include cognitive, depression, and falls Referrals and appointments  In addition, I have reviewed and discussed with patient certain preventive protocols, quality metrics, and best practice recommendations. A written personalized care plan for preventive services as well as general preventive health recommendations were provided to patient.     Marcos Ruelas X Nyana Haren, NP   08/06/2023   After Visit Summary: (In Person-Declined) Patient declined AVS at this time.

## 2023-08-08 ENCOUNTER — Non-Acute Institutional Stay (SKILLED_NURSING_FACILITY): Payer: Self-pay | Admitting: Sports Medicine

## 2023-08-08 DIAGNOSIS — F0390 Unspecified dementia without behavioral disturbance: Secondary | ICD-10-CM | POA: Diagnosis not present

## 2023-08-08 DIAGNOSIS — K219 Gastro-esophageal reflux disease without esophagitis: Secondary | ICD-10-CM | POA: Diagnosis not present

## 2023-08-08 DIAGNOSIS — F5101 Primary insomnia: Secondary | ICD-10-CM

## 2023-08-08 DIAGNOSIS — I471 Supraventricular tachycardia, unspecified: Secondary | ICD-10-CM

## 2023-08-08 NOTE — Progress Notes (Unsigned)
 Provider:  Dr. Tye Gall Location:  [facility] Place of Service:   Skilled care   PCP: Tye Gall, MD Patient Care Team: Tye Gall, MD as PCP - General (Internal Medicine)  Extended Emergency Contact Information Primary Emergency Contact: [name],[name] Mobile Phone: [phone] Relation: Nephew Secondary Emergency Contact: [name],[name] Mobile Phone: [phone] Relation: Friend  Goals of Care: Advanced Directive information    08/05/2023    1:31 PM  Advanced Directives  Does Patient Have a Medical Advance Directive? Yes  Type of Estate agent of [place];Living will;Out of facility DNR (pink MOST or yellow form)  Does patient want to make changes to medical advance directive? No - Patient declined  Copy of Healthcare Power of Attorney in Chart? Yes - validated most recent copy scanned in chart (See row information)  Pre-existing out of facility DNR order (yellow form or pink MOST form) Pink MOST form placed in chart (order not valid for inpatient use);Yellow form placed in chart (order not valid for inpatient use)            History of Present Illness  89 yr old F with h/o  rash, emphysema, oedema,GERD, Depression, CKD, CVA is evaluated for chronic disease management. Pt seen and examined in her room  She is laying on her bed Seems pleasant and comfortable and does not appear to be in distress She is smiling during the interview today  Pt is not able to participate in conversations, she does not comprehend. As per nursing staff no concerns She ambulates with a walker   No agitation as per nursing staff She consumes 50% of her meals   07/09/2023 14:13 93.8 Lbs (Standing) 07/09/2023 13:41 95.5 Lbs (Standing) 07/03/2023 11:55 95.5 Lbs (Standing) 06/26/2023 12:02 95.1 Lbs (Standing) 06/19/2023 07:50 95.2 Lbs (Standing) 06/12/2023 10:10 94.6 Lbs (Standing) 06/12/2023 09:48 94.6 Lbs  (Standing    Past Medical History:  Diagnosis Date   Atrial flutter, paroxysmal (HCC)    COVID-19    Dementia (HCC)    Diastolic dysfunction    Hyperlipidemia    Hypertension    PSVT (paroxysmal supraventricular tachycardia) (HCC)    Stroke (HCC)    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 (HCC)    No past surgical history on file.  reports that she has never smoked. She has never used smokeless tobacco. She reports that she does not drink alcohol and does not use drugs. Social History   Socioeconomic History   Marital status: Widowed    Spouse name: Not on file   Number of children: 0   Years of education: Not on file   Highest education level: Not on file  Occupational History   Not on file  Tobacco Use   Smoking status: Never   Smokeless tobacco: Never  Vaping Use   Vaping status: Never Used  Substance and Sexual Activity   Alcohol use: Never   Drug use: Never   Sexual activity: Not on file  Other Topics Concern   Not on file  Social History Narrative   Not on file   Social Drivers of Health   Financial Resource Strain: Not on file  Food Insecurity: No Food Insecurity (08/16/2021)   Hunger Vital Sign    Worried About Running Out of Food in the Last Year: Never true    Ran Out of Food in the Last Year: Never true  Transportation Needs: Not on file  Physical Activity: Not on file  Stress: Not on  file  Social Connections: Not on file  Intimate Partner Violence: Not on file    Functional Status Survey:    No family history on file.  Health Maintenance  Topic Date Due   COVID-19 Vaccine (8 - 2024-25 season) 12/09/2023 (Originally 03/20/2023)   INFLUENZA VACCINE  11/08/2023   DTaP/Tdap/Td (4 - Td or Tdap) 07/18/2032   Pneumonia Vaccine 72+ Years old  Completed   DEXA SCAN  Completed   Zoster Vaccines- Shingrix  Completed   HPV VACCINES  Aged Out   Meningococcal B Vaccine  Aged Out    Allergies   Allergen Reactions   Alphagan P [Brimonidine Tartrate] Other (See Comments)    Unknown reaction Listed on MAR   Azopt [Brinzolamide] Other (See Comments)    Unknown reaction Listed on MAR   Lumigan [Bimatoprost] Other (See Comments)    Unknown reaction Listed on [facility]    Outpatient Encounter Medications as of 08/08/2023  Medication Sig   apixaban  (ELIQUIS ) 2.5 MG TABS tablet Take 1 tablet (2.5 mg total) by mouth 2 (two) times daily.   B Complex-Folic Acid (B COMPLEX VITAMINS, W/ FA, PO) Take 1 tablet by mouth daily. Vitamin B complex - Folic Acid 0.4 mg   CALAMINE EX Apply 1 Application topically in the morning and at bedtime.   Calcium  Carbonate-Vitamin D 600-400 MG-UNIT tablet Take 1 tablet by mouth daily.   lactose free nutrition (BOOST) LIQD Take 237 mLs by mouth daily.   melatonin 3 MG TABS tablet Take 3 mg by mouth at bedtime.   metoprolol  tartrate (LOPRESSOR ) 25 MG tablet Take 12.5 mg by mouth as needed.  for SVT HOLD IF HR < 50   metoprolol  tartrate (LOPRESSOR ) 25 MG tablet Take 12.5 mg by mouth 2 (two) times daily. for SVT HOLD IF BP <90/60, HR<50   oseltamivir (TAMIFLU) 75 MG capsule Take 75 mg by mouth 2 (two) times daily. (Patient not taking: Reported on 08/05/2023)   pantoprazole  (PROTONIX ) 40 MG tablet Take 40 mg by mouth daily. DAW   Sodium Fluoride (PREVIDENT 5000 BOOSTER PLUS) 1.1 % PSTE Place 1 application. onto teeth at bedtime.   timolol  (TIMOPTIC ) 0.5 % ophthalmic solution Place 1 drop into both eyes daily.   No facility-administered encounter medications on file as of 08/08/2023.    Review of Systems  Unable to perform ROS: Dementia  Constitutional:  Negative for fever.  Respiratory:  Negative for cough.   Gastrointestinal:  Negative for blood in stool.  Genitourinary:  Negative for hematuria.  Psychiatric/Behavioral:  Negative for agitation and behavioral problems.    Negative unless indicated in HPI.  There were no vitals filed for this visit. There is no  height or weight on file to calculate BMI. BP Readings from Last 3 Encounters:  08/05/23 (!) 121/50  07/17/23 (!) 128/56  07/10/23 131/68   Wt Readings from Last 3 Encounters:  08/05/23 93 lb 12.8 oz (42.5 kg)  07/17/23 93 lb 12.8 oz (42.5 kg)  07/10/23 93 lb 12.8 oz (42.5 kg)   Physical Exam Constitutional:      Appearance: Normal appearance.  HENT:     Head: Normocephalic and atraumatic.  Cardiovascular:     Rate and Rhythm: Normal rate and regular rhythm.  Pulmonary:     Effort: Pulmonary effort is normal. No respiratory distress.     Breath sounds: Normal breath sounds. No wheezing.  Abdominal:     General: Bowel sounds are normal. There is no distension.     Tenderness:  There is no abdominal tenderness. There is no guarding or rebound.     Comments:    Musculoskeletal:        General: No swelling.  Neurological:     Mental Status: She is alert. Mental status is at baseline.     Motor: No weakness.     Labs reviewed: Basic Metabolic Panel: No results for input(s): "NA", "K", "CL", "CO2", "GLUCOSE", "BUN", "CREATININE", "CALCIUM ", "MG", "PHOS" in the last 8760 hours. Liver Function Tests: No results for input(s): "AST", "ALT", "ALKPHOS", "BILITOT", "PROT", "ALBUMIN" in the last 8760 hours. No results for input(s): "LIPASE", "AMYLASE" in the last 8760 hours. No results for input(s): "AMMONIA" in the last 8760 hours. CBC: No results for input(s): "WBC", "NEUTROABS", "HGB", "HCT", "MCV", "PLT" in the last 8760 hours. Cardiac Enzymes: No results for input(s): "CKTOTAL", "CKMB", "CKMBINDEX", "TROPONINI" in the last 8760 hours. BNP: Invalid input(s): "POCBNP" Lab Results  Component Value Date   HGBA1C 5.9 (H) 11/04/2019   Lab Results  Component Value Date   TSH 3.96 06/12/2022   Lab Results  Component Value Date   VITAMINB12 695 01/21/2020   No results found for: "FOLATE" Lab Results  Component Value Date   IRON 44 01/21/2020   TIBC 273 01/21/2020    FERRITIN 63 01/21/2020    Imaging and Procedures obtained prior to SNF admission: ECHOCARDIOGRAM COMPLETE Result Date: 08/27/2021    ECHOCARDIOGRAM REPORT   Patient Name:   Bianca [name] Date of Exam: 08/27/2021 Medical Rec #:  [id number]        Height:       58.0 in Accession #:    [id number]       Weight:       114.9 lb Date of Birth:  21-Aug-1922         BSA:          1.439 m Patient Age:    89 years         BP:           103/92 mmHg Patient Gender: F                HR:           124 bpm. Exam Location:  Inpatient Procedure: 2D Echo, Cardiac Doppler and Color Doppler Indications:    CHF  History:        Patient has prior history of Echocardiogram examinations, most                 recent 11/05/2019. Arrythmias:Tachycardia.  Sonographer:    Venice Gillis Referring Phys: [id number] RAVI PAHWANI IMPRESSIONS  1. Left ventricular ejection fraction, by estimation, is 60 to 65%. The left ventricle has normal function. The left ventricle has no regional wall motion abnormalities. Indeterminate diastolic filling due to E-A fusion.  2. Right ventricular systolic function is moderately reduced. The right ventricular size is mildly enlarged. There is moderately elevated pulmonary artery systolic pressure.  3. Left atrial size was mildly dilated.  4. Right atrial size was mildly dilated.  5. The mitral valve is normal in structure. Trivial mitral valve regurgitation. No evidence of mitral stenosis.  6. Tricuspid valve regurgitation is severe.  7. The aortic valve is tricuspid. Aortic valve regurgitation is mild. No aortic stenosis is present.  8. The inferior vena cava is dilated in size with <50% respiratory variability, suggesting right atrial pressure of 15 mmHg. Comparison(s): No significant change from prior study. Prior images reviewed side by side. The right  ventricular hypertrophy is worse. Systolic PA pressure is higher and tricuspid insufficiency is worse. FINDINGS  Left Ventricle: Left ventricular ejection  fraction, by estimation, is 60 to 65%. The left ventricle has normal function. The left ventricle has no regional wall motion abnormalities. The left ventricular internal cavity size was normal in size. There is  no left ventricular hypertrophy. Indeterminate diastolic filling due to E-A fusion. Right Ventricle: The right ventricular size is mildly enlarged. No increase in right ventricular wall thickness. Right ventricular systolic function is moderately reduced. There is moderately elevated pulmonary artery systolic pressure. The tricuspid regurgitant velocity is 3.23 m/s, and with an assumed right atrial pressure of 15 mmHg, the estimated right ventricular systolic pressure is 56.7 mmHg. Left Atrium: Left atrial size was mildly dilated. Right Atrium: Right atrial size was mildly dilated. Pericardium: There is no evidence of pericardial effusion. Mitral Valve: The mitral valve is normal in structure. Trivial mitral valve regurgitation. No evidence of mitral valve stenosis. Tricuspid Valve: The tricuspid valve is normal in structure. Tricuspid valve regurgitation is severe. No evidence of tricuspid stenosis. Aortic Valve: The aortic valve is tricuspid. Aortic valve regurgitation is mild. No aortic stenosis is present. Aortic valve peak gradient measures 3.8 mmHg. Pulmonic Valve: The pulmonic valve was normal in structure. Pulmonic valve regurgitation is trivial. No evidence of pulmonic stenosis. Aorta: The aortic root is normal in size and structure. Venous: The inferior vena cava is dilated in size with less than 50% respiratory variability, suggesting right atrial pressure of 15 mmHg. The inferior vena cava and the hepatic vein show a pattern of systolic flow reversal, suggestive of tricuspid regurgitation. IAS/Shunts: No atrial level shunt detected by color flow Doppler.  LEFT VENTRICLE PLAX 2D LVIDd:         2.70 cm LVIDs:         2.05 cm LV PW:         1.10 cm LV IVS:        1.10 cm LVOT diam:     1.80 cm LV  SV:         32 LV SV Index:   22 LVOT Area:     2.54 cm  RIGHT VENTRICLE            IVC RV Basal diam:  4.10 cm    IVC diam: 2.20 cm RV Mid diam:    3.70 cm RV S prime:     6.39 cm/s LEFT ATRIUM             Index        RIGHT ATRIUM           Index LA diam:        3.90 cm 2.71 cm/m   RA Area:     18.20 cm LA Vol (A2C):   39.8 ml 27.66 ml/m  RA Volume:   53.00 ml  36.83 ml/m LA Vol (A4C):   34.9 ml 24.25 ml/m LA Biplane Vol: 38.6 ml 26.83 ml/m  AORTIC VALVE                 PULMONIC VALVE AV Area (Vmax): 2.00 cm     PV Vmax:       0.60 m/s AV Vmax:        97.70 cm/s   PV Peak grad:  1.4 mmHg AV Peak Grad:   3.8 mmHg LVOT Vmax:      76.90 cm/s LVOT Vmean:     45.200 cm/s LVOT VTI:  0.125 m  AORTA Ao Root diam: 2.90 cm Ao Asc diam:  2.80 cm TRICUSPID VALVE TR Peak grad:   41.7 mmHg TR Vmax:        323.00 cm/s  SHUNTS Systemic VTI:  0.12 m Systemic Diam: 1.80 cm Luana Rumple MD Electronically signed by Luana Rumple MD Signature Date/Time: 08/27/2021/12:15:15 PM    Final    DG Chest 2 View Result Date: 08/26/2021 CLINICAL DATA:  Chest pain.  Bradycardia. EXAM: CHEST - 2 VIEW COMPARISON:  08/13/2021 FINDINGS: Moderate cardiomegaly remains stable. New small right pleural effusion. Increased diffuse interstitial prominence suspicious for mild pulmonary edema. No evidence of pulmonary consolidation. IMPRESSION: Mild congestive heart failure with small right pleural effusion. Electronically Signed   By: Marlyce Sine M.D.   On: 08/26/2021 10:14    Assessment and Plan Assessment & Plan  Major neurocognitive disorder No behavioral manifestations as per nursing staff. Continue with supportive care increase cognitively engaging activities Functional Assessment Staging Scale: 7b - Speech ability is limited to using a single intelligible word on an average day or in an intensive interview (the person may repeat the word over and over).   History of  Paroxysmal SVT  Continue with Eliquis  2.5 mg twice  daily metoprolol  12.5 mg twice daily No signs of bleeding Monitor blood pressure  Insomnia Continue with melatonin  GERD Continue with omeprazole    30 min Total time spent for obtaining history,  performing a medically appropriate examination and evaluation, reviewing the tests, documenting clinical information in the electronic or other health record, ,care coordination (not separately reported)

## 2023-08-12 ENCOUNTER — Encounter: Payer: Self-pay | Admitting: Sports Medicine

## 2023-09-17 ENCOUNTER — Non-Acute Institutional Stay (SKILLED_NURSING_FACILITY): Payer: Self-pay | Admitting: Nurse Practitioner

## 2023-09-17 ENCOUNTER — Encounter: Payer: Self-pay | Admitting: Nurse Practitioner

## 2023-09-17 DIAGNOSIS — R609 Edema, unspecified: Secondary | ICD-10-CM

## 2023-09-17 DIAGNOSIS — I471 Supraventricular tachycardia, unspecified: Secondary | ICD-10-CM | POA: Diagnosis not present

## 2023-09-17 DIAGNOSIS — F0390 Unspecified dementia without behavioral disturbance: Secondary | ICD-10-CM | POA: Diagnosis not present

## 2023-09-17 DIAGNOSIS — F339 Major depressive disorder, recurrent, unspecified: Secondary | ICD-10-CM

## 2023-09-17 DIAGNOSIS — J439 Emphysema, unspecified: Secondary | ICD-10-CM

## 2023-09-17 DIAGNOSIS — K219 Gastro-esophageal reflux disease without esophagitis: Secondary | ICD-10-CM | POA: Diagnosis not present

## 2023-09-17 DIAGNOSIS — I639 Cerebral infarction, unspecified: Secondary | ICD-10-CM

## 2023-09-17 NOTE — Assessment & Plan Note (Signed)
 Heart rate is in control, off Amiodarone  01/2020 in hospital due to prolonged QT interval. On Metoprolol  bid and prn, Eliquis 

## 2023-09-17 NOTE — Assessment & Plan Note (Signed)
 no behavioral issues. MMSE 1/30 08/06/23

## 2023-09-17 NOTE — Assessment & Plan Note (Signed)
stable, off Sertraline.  

## 2023-09-17 NOTE — Assessment & Plan Note (Signed)
no further c/o abd discomfort or pain since PPI switched from Omeprazole to Pantoprazole 10/05/22. Hgb 14.9 06/12/22

## 2023-09-17 NOTE — Assessment & Plan Note (Signed)
 On and off hacking cough.

## 2023-09-17 NOTE — Assessment & Plan Note (Signed)
11/02/19 CVA (MRI cevebral small acute infarcts in the posterior left hemisphere in the left PCA and MCA watershed area. Takes Eliquis.

## 2023-09-17 NOTE — Assessment & Plan Note (Signed)
lower back, aches, ambulates with walker.

## 2023-09-17 NOTE — Assessment & Plan Note (Signed)
not apparent, off Torsemide. BNP 798 08/26/21, echo 08/27/21 EF 60-65%, weight stable.

## 2023-09-17 NOTE — Progress Notes (Unsigned)
 Location:  [facility] Room Number: N040-A Place of Service:  SNF (31) Provider:    Kaili Castille X, NP   Patient Care Team: Tye Gall, MD as PCP - General (Internal Medicine)  Extended Emergency Contact Information Primary Emergency Contact: [name],[name] Mobile Phone: [phone] Relation: Nephew Secondary Emergency Contact: [name],[name] Mobile Phone: [phone] Relation: Friend  Code Status:  DNR Goals of care: Advanced Directive information    09/17/2023    9:23 AM  Advanced Directives  Does Patient Have a Medical Advance Directive? Yes  Type of Estate agent of [place];Living will;Out of facility DNR (pink MOST or yellow form)  Does patient want to make changes to medical advance directive? No - Patient declined  Copy of Healthcare Power of Attorney in Chart? Yes - validated most recent copy scanned in chart (See row information)  Pre-existing out of facility DNR order (yellow form or pink MOST form) Pink MOST form placed in chart (order not valid for inpatient use);Yellow form placed in chart (order not valid for inpatient use)     Chief Complaint  Patient presents with  . Medical Management of Chronic Issues    HPI:  Pt is a 89 y.o. female seen today for medical management of chronic diseases.     Hx of emphysema, stable.  Edema, not apparent, off Torsemide. BNP 798 08/26/21, echo 08/27/21 EF 60-65%, weight stable.              PSVT off Amiodarone  01/2020 in hospital due to prolonged QT interval. On Metoprolol  bid and prn, Eliquis              GERD, no further c/o abd discomfort or pain since PPI switched from Omeprazole  to Pantoprazole  10/05/22. Hgb 14.9 06/12/22             Depression, stable, off Sertraline .              CKD Bun/creat 22/1.0 06/12/22             Senile dementia since CVA, no behavioral issues. MMSE 1/30 08/06/23             11/02/19 CVA (MRI cevebral small acute infarcts in the posterior left  hemisphere in the left PCA and MCA watershed area. Takes Eliquis .              Anemia, 01/21/20 Iron 44, ferritin 63, Vit B12 695, Hgb 14.9 06/12/22             Hyperlipidemia, off Atorvastatin , LDL 41 09/19/21             OA lower back, aches, ambulates with walker.        Past Medical History:  Diagnosis Date  . Atrial flutter, paroxysmal (HCC)   . COVID-19   . Dementia (HCC)   . Diastolic dysfunction   . Hyperlipidemia   . Hypertension   . PSVT (paroxysmal supraventricular tachycardia) (HCC)   . Stroke (HCC)   . 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 No pertinent surgical history.  Allergies  Allergen Reactions  . Alphagan P [Brimonidine Tartrate] Other (See Comments)    Unknown reaction Listed on MAR  . Azopt [Brinzolamide] Other (See Comments)    Unknown reaction Listed on MAR  . Lumigan [Bimatoprost] Other (See Comments)    Unknown reaction Listed on [facility]    Outpatient Encounter Medications as of 09/17/2023  Medication Sig  . apixaban  (ELIQUIS )  2.5 MG TABS tablet Take 1 tablet (2.5 mg total) by mouth 2 (two) times daily.  . B Complex-Folic Acid (B COMPLEX VITAMINS, W/ FA, PO) Take 1 tablet by mouth daily. Vitamin B complex - Folic Acid 0.4 mg  . CALAMINE EX Apply 1 Application topically in the morning and at bedtime.  . Calcium  Carbonate-Vitamin D 600-400 MG-UNIT tablet Take 1 tablet by mouth daily.  . melatonin 3 MG TABS tablet Take 3 mg by mouth at bedtime.  . metoprolol  tartrate (LOPRESSOR ) 25 MG tablet Take 12.5 mg by mouth as needed.  for SVT HOLD IF HR < 50  . metoprolol  tartrate (LOPRESSOR ) 25 MG tablet Take 12.5 mg by mouth 2 (two) times daily. for SVT HOLD IF BP <90/60, HR<50  . omeprazole  (PRILOSEC) 20 MG capsule Take 20 mg by mouth daily.  . Sodium Fluoride (PREVIDENT 5000 BOOSTER PLUS) 1.1 % PSTE Place 1 application. onto teeth at bedtime.  . timolol  (TIMOPTIC ) 0.5 % ophthalmic  solution Place 1 drop into both eyes daily.  . [DISCONTINUED] lactose free nutrition (BOOST) LIQD Take 237 mLs by mouth daily. (Patient not taking: Reported on 09/17/2023)  . [DISCONTINUED] oseltamivir (TAMIFLU) 75 MG capsule Take 75 mg by mouth 2 (two) times daily. (Patient not taking: Reported on 09/17/2023)   No facility-administered encounter medications on file as of 09/17/2023.    Review of Systems  Unable to perform ROS: Dementia    Immunization History  Administered Date(s) Administered  . DTaP 09/05/2004  . Fluad Quad(high Dose 65+) 02/09/2023  . Influenza, High Dose Seasonal PF 01/06/2014, 11/08/2018  . Influenza, Quadrivalent, Recombinant, Inj, Pf 01/09/2018, 01/21/2019  . Influenza-Unspecified 11/07/2013, 01/14/2020, 01/26/2021, 01/31/2022  . Moderna Covid-19 Vaccine  Bivalent Booster 83yrs & up 04/04/2021  . Moderna SARS-COV2 Booster Vaccination 02/08/2022  . Moderna Sars-Covid-2 Vaccination 05/09/2019, 06/06/2019, 02/22/2020, 09/06/2020  . PFIZER(Purple Top)SARS-COV-2 Vaccination 01/23/2023  . Pneumococcal Conjugate-13 01/06/2014  . Pneumococcal Polysaccharide-23 09/05/2004  . Pneumococcal-Unspecified 09/05/2004, 10/18/2014  . Tdap 09/05/2004, 07/19/2022  . Zoster Recombinant(Shingrix) 03/21/2021, 05/24/2021   Pertinent  Health Maintenance Due  Topic Date Due  . INFLUENZA VACCINE  11/08/2023  . DEXA SCAN  Completed      08/26/2021    8:30 PM 08/27/2021   10:00 AM 01/09/2022   12:36 PM 06/25/2022   12:28 PM 07/19/2022   10:41 AM  Fall Risk  Falls in the past year?   0 0 0  Was there an injury with Fall?   0 0 0  Fall Risk Category Calculator   0 0 0  Fall Risk Category (Retired)   Low    (RETIRED) Patient Fall Risk Level High fall risk High fall risk Low fall risk    Patient at Risk for Falls Due to   No Fall Risks No Fall Risks No Fall Risks  Fall risk Follow up   Falls evaluation completed Falls evaluation completed Falls evaluation completed   Functional Status  Survey:    Vitals:   09/17/23 0920  BP: 124/66  Pulse: 86  Resp: 18  Temp: (!) 97.1 F (36.2 C)  SpO2: 97%  Weight: 94 lb 8 oz (42.9 kg)  Height: 4' 10 (1.473 m)   Body mass index is 19.75 kg/m. Physical Exam Vitals and nursing note reviewed.  Constitutional:      Appearance: Normal appearance.  HENT:     Head: Normocephalic and atraumatic.     Nose: Nose normal.     Mouth/Throat:     Mouth: Mucous membranes  are moist.  Eyes:     Extraocular Movements: Extraocular movements intact.     Conjunctiva/sclera: Conjunctivae normal.     Pupils: Pupils are equal, round, and reactive to light.  Cardiovascular:     Rate and Rhythm: Normal rate and regular rhythm.     Heart sounds: No murmur heard. Pulmonary:     Effort: Pulmonary effort is normal.     Breath sounds: No rales.  Abdominal:     General: Bowel sounds are normal.     Palpations: Abdomen is soft.     Tenderness: There is no abdominal tenderness.  Musculoskeletal:     Cervical back: Normal range of motion and neck supple.     Right lower leg: No edema.     Left lower leg: No edema.  Skin:    General: Skin is warm and dry.     Comments: Hammer toes most of her toes Yellow, thick toe nails.   Neurological:     General: No focal deficit present.     Mental Status: She is alert and oriented to person, place, and time. Mental status is at baseline.     Gait: Gait abnormal.     Comments: Ambulates with walker.   Psychiatric:        Mood and Affect: Mood normal.        Behavior: Behavior normal.        Thought Content: Thought content normal.    Labs reviewed: No results for input(s): NA, K, CL, CO2, GLUCOSE, BUN, CREATININE, CALCIUM , MG, PHOS in the last 8760 hours. No results for input(s): AST, ALT, ALKPHOS, BILITOT, PROT, ALBUMIN in the last 8760 hours. No results for input(s): WBC, NEUTROABS, HGB, HCT, MCV, PLT in the last 8760 hours. Lab Results  Component Value  Date   TSH 3.96 06/12/2022   Lab Results  Component Value Date   HGBA1C 5.9 (H) 11/04/2019   Lab Results  Component Value Date   CHOL 110 09/19/2021   HDL 55 09/19/2021   LDLCALC 41 09/19/2021   TRIG 56 09/19/2021   CHOLHDL 2.8 11/06/2019    Significant Diagnostic Results in last 30 days:  No results found.  Assessment/Plan Paroxysmal SVT (supraventricular tachycardia) (HCC) Heart rate is in control, off Amiodarone  01/2020 in hospital due to prolonged QT interval. On Metoprolol  bid and prn, Eliquis   GERD (gastroesophageal reflux disease) no further c/o abd discomfort or pain since PPI switched from Omeprazole  to Pantoprazole  10/05/22. Hgb 14.9 06/12/22  Depression, recurrent (HCC)  stable, off Sertraline .   Major neurocognitive disorder (HCC)  no behavioral issues. MMSE 1/30 08/06/23  Edema  not apparent, off Torsemide. BNP 798 08/26/21, echo 08/27/21 EF 60-65%, weight stable.   Emphysema of lung (HCC) On and off hacking cough.   Stroke [facility]) 11/02/19 CVA (MRI cevebral small acute infarcts in the posterior left hemisphere in the left PCA and MCA watershed area. Takes Eliquis .   Osteoarthritis, multiple sites  lower back, aches, ambulates with walker.      Family/ staff Communication: plan of care reviewed with the patient and charge nurse   Labs/tests ordered:  none

## 2023-10-08 ENCOUNTER — Non-Acute Institutional Stay (SKILLED_NURSING_FACILITY): Payer: Self-pay | Admitting: Nurse Practitioner

## 2023-10-08 ENCOUNTER — Encounter: Payer: Self-pay | Admitting: Nurse Practitioner

## 2023-10-08 DIAGNOSIS — K219 Gastro-esophageal reflux disease without esophagitis: Secondary | ICD-10-CM

## 2023-10-08 DIAGNOSIS — I471 Supraventricular tachycardia, unspecified: Secondary | ICD-10-CM | POA: Diagnosis not present

## 2023-10-08 DIAGNOSIS — F339 Major depressive disorder, recurrent, unspecified: Secondary | ICD-10-CM | POA: Diagnosis not present

## 2023-10-08 DIAGNOSIS — F0390 Unspecified dementia without behavioral disturbance: Secondary | ICD-10-CM

## 2023-10-08 NOTE — Assessment & Plan Note (Signed)
no further c/o abd discomfort or pain since PPI switched from Omeprazole to Pantoprazole 10/05/22. Hgb 14.9 06/12/22

## 2023-10-08 NOTE — Assessment & Plan Note (Signed)
lower back, aches, ambulates with walker.

## 2023-10-08 NOTE — Assessment & Plan Note (Signed)
 no behavioral issues. MMSE 1/30 08/06/23

## 2023-10-08 NOTE — Assessment & Plan Note (Signed)
off Amiodarone 01/2020 in hospital due to prolonged QT interval. On Metoprolol bid and prn,  Eliquis.  

## 2023-10-08 NOTE — Assessment & Plan Note (Signed)
stable, off Sertraline.  

## 2023-10-08 NOTE — Progress Notes (Unsigned)
 Location:   [facility] Room Number: 40A Place of Service:  SNF (31) Provider: Larwance Taylie Helder NP  Sherlynn Madden, MD  Patient Care Team: Sherlynn Madden, MD as PCP - General (Internal Medicine)  Extended Emergency Contact Information Primary Emergency Contact: [name],[name] Mobile Phone: [phone] Relation: Nephew Secondary Emergency Contact: [name],[name] Mobile Phone: [phone] Relation: Friend  Code Status:  DNR Goals of care: Advanced Directive information    09/17/2023    9:23 AM  Advanced Directives  Does Patient Have a Medical Advance Directive? Yes  Type of Estate agent of [place];Living will;Out of facility DNR (pink MOST or yellow form)  Does patient want to make changes to medical advance directive? No - Patient declined  Copy of Healthcare Power of Attorney in Chart? Yes - validated most recent copy scanned in chart (See row information)  Pre-existing out of facility DNR order (yellow form or pink MOST form) Pink MOST form placed in chart (order not valid for inpatient use);Yellow form placed in chart (order not valid for inpatient use)     Chief Complaint  Patient presents with   Medical Management of Chronic Issues    HPI:  Pt is a 89 y.o. female seen today for medical management of chronic diseases.      Hx of emphysema, stable.  Edema, not apparent, off Torsemide. BNP 798 08/26/21, echo 08/27/21 EF 60-65%, weight stable.              PSVT off Amiodarone  01/2020 in hospital due to prolonged QT interval. On Metoprolol  bid and prn, Eliquis              GERD, no further c/o abd discomfort or pain since PPI switched from Omeprazole  to Pantoprazole  10/05/22. Hgb 14.9 06/12/22             Depression, stable, off Sertraline .              CKD Bun/creat 22/1.0 06/12/22             Senile dementia since CVA, no behavioral issues. MMSE 1/30 08/06/23             11/02/19 CVA (MRI cevebral small acute infarcts in the posterior left  hemisphere in the left PCA and MCA watershed area. Takes Eliquis .              Anemia, 01/21/20 Iron 44, ferritin 63, Vit B12 695, Hgb 14.9 06/12/22             Hyperlipidemia, off Atorvastatin , LDL 41 09/19/21             OA lower back, aches, ambulates with walker.     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 No pertinent surgical history.  Allergies  Allergen Reactions   Alphagan P [Brimonidine Tartrate] Other (See Comments)    Unknown reaction Listed on MAR   Azopt [Brinzolamide] Other (See Comments)    Unknown reaction Listed on MAR   Lumigan [Bimatoprost] Other (See Comments)    Unknown reaction Listed on MAR    Allergies as of 10/08/2023       Reactions   Alphagan P [brimonidine  Tartrate] Other (See Comments)   Unknown reaction Listed on MAR   Azopt [brinzolamide] Other (See Comments)   Unknown reaction Listed on MAR   Lumigan [bimatoprost] Other (See Comments)   Unknown reaction Listed on [facility]        Medication List        Accurate as of October 08, 2023 11:59 PM. If you have any questions, ask your nurse or doctor.          apixaban  2.5 MG Tabs tablet Commonly known as: ELIQUIS  Take 1 tablet (2.5 mg total) by mouth 2 (two) times daily.   B COMPLEX VITAMINS (W/ FA) PO Take 1 tablet by mouth daily. Vitamin B complex - Folic Acid 0.4 mg   CALAMINE EX Apply 1 Application topically in the morning and at bedtime.   Calcium  Carbonate-Vitamin D 600-400 MG-UNIT tablet Take 1 tablet by mouth daily.   melatonin 3 MG Tabs tablet Take 3 mg by mouth at bedtime.   metoprolol  tartrate 25 MG tablet Commonly known as: LOPRESSOR  Take 12.5 mg by mouth  as needed.  for SVT HOLD IF HR < 50   metoprolol  tartrate 25 MG tablet Commonly known as: LOPRESSOR  Take 12.5 mg by mouth 2 (two) times daily. for SVT HOLD IF BP <90/60, HR<50   omeprazole  20 MG capsule Commonly known as: PRILOSEC Take 20 mg by mouth daily.   PreviDent 5000 Booster Plus 1.1 % Pste Generic drug: Sodium Fluoride Place 1 application. onto teeth at bedtime.   timolol  0.5 % ophthalmic solution Commonly known as: TIMOPTIC  Place 1 drop into both eyes daily.        Review of Systems  Unable to perform ROS: Dementia    Immunization History  Administered Date(s) Administered   DTaP 09/05/2004   Fluad Quad(high Dose 65+) 02/09/2023   Influenza, High Dose Seasonal PF 01/06/2014, 11/08/2018   Influenza, Quadrivalent, Recombinant, Inj, Pf 01/09/2018, 01/21/2019   Influenza-Unspecified 11/07/2013, 01/14/2020, 01/26/2021, 01/31/2022   Moderna Covid-19 Vaccine  Bivalent Booster 41yrs & up 04/04/2021   Moderna SARS-COV2 Booster Vaccination 02/08/2022   Moderna Sars-Covid-2 Vaccination 05/09/2019, 06/06/2019, 02/22/2020, 09/06/2020   PFIZER(Purple Top)SARS-COV-2 Vaccination 01/23/2023   Pneumococcal Conjugate-13 01/06/2014   Pneumococcal Polysaccharide-23 09/05/2004   Pneumococcal-Unspecified 09/05/2004, 10/18/2014   Tdap 09/05/2004, 07/19/2022   Zoster Recombinant(Shingrix) 03/21/2021, 05/24/2021   Pertinent  Health Maintenance Due  Topic Date Due   INFLUENZA VACCINE  11/08/2023   DEXA SCAN  Completed      08/26/2021    8:30 PM 08/27/2021   10:00 AM 01/09/2022   12:36 PM 06/25/2022   12:28 PM 07/19/2022   10:41 AM  Fall Risk  Falls in the past year?   0 0 0  Was there an injury with Fall?   0 0 0  Fall Risk Category Calculator   0 0 0  Fall Risk Category (Retired)   Low     (RETIRED) Patient Fall Risk Level High fall risk  High fall risk  Low fall risk     Patient at Risk for Falls Due to   No Fall Risks No Fall Risks No Fall Risks  Fall risk Follow up   Falls  evaluation completed  Falls evaluation completed Falls evaluation completed     Data saved with a previous flowsheet row definition   Functional Status Survey:    Vitals:   10/08/23 1430 10/10/23 1615  BP: (!) 136/55 (!) 156/92  Pulse: 62   Resp: 20   Temp: (!) 97.3 F (36.3  C)   SpO2: 98%   Weight: 95 lb 11.2 oz (43.4 kg)    Body mass index is 20 kg/m. Physical Exam Vitals and nursing note reviewed.  Constitutional:      Appearance: Normal appearance.  HENT:     Head: Normocephalic and atraumatic.     Nose: Nose normal.     Mouth/Throat:     Mouth: Mucous membranes are moist.  Eyes:     Extraocular Movements: Extraocular movements intact.     Conjunctiva/sclera: Conjunctivae normal.     Pupils: Pupils are equal, round, and reactive to light.  Cardiovascular:     Rate and Rhythm: Normal rate and regular rhythm.     Heart sounds: No murmur heard. Pulmonary:     Effort: Pulmonary effort is normal.     Breath sounds: No rales.  Abdominal:     General: Bowel sounds are normal.     Palpations: Abdomen is soft.     Tenderness: There is no abdominal tenderness.  Musculoskeletal:     Cervical back: Normal range of motion and neck supple.     Right lower leg: No edema.     Left lower leg: No edema.  Skin:    General: Skin is warm and dry.     Comments: Hammer toes most of her toes Yellow, thick toe nails.   Neurological:     General: No focal deficit present.     Mental Status: She is alert and oriented to person, place, and time. Mental status is at baseline.     Gait: Gait abnormal.     Comments: Ambulates with walker.   Psychiatric:        Mood and Affect: Mood normal.        Behavior: Behavior normal.        Thought Content: Thought content normal.     Labs reviewed: No results for input(s): NA, K, CL, CO2, GLUCOSE, BUN, CREATININE, CALCIUM , MG, PHOS in the last 8760 hours. No results for input(s): AST, ALT, ALKPHOS, BILITOT,  PROT, ALBUMIN in the last 8760 hours. No results for input(s): WBC, NEUTROABS, HGB, HCT, MCV, PLT in the last 8760 hours. Lab Results  Component Value Date   TSH 3.96 06/12/2022   Lab Results  Component Value Date   HGBA1C 5.9 (H) 11/04/2019   Lab Results  Component Value Date   CHOL 110 09/19/2021   HDL 55 09/19/2021   LDLCALC 41 09/19/2021   TRIG 56 09/19/2021   CHOLHDL 2.8 11/06/2019    Significant Diagnostic Results in last 30 days:  No results found.  Assessment/Plan  Paroxysmal SVT (supraventricular tachycardia) (HCC)  off Amiodarone  01/2020 in hospital due to prolonged QT interval. On Metoprolol  bid and prn, Eliquis   GERD (gastroesophageal reflux disease) no further c/o abd discomfort or pain since PPI switched from Omeprazole  to Pantoprazole  10/05/22. Hgb 14.9 06/12/22  Depression, recurrent (HCC)  stable, off Sertraline .   Major neurocognitive disorder (HCC)  no behavioral issues. MMSE 1/30 08/06/23  Osteoarthritis, multiple sites  lower back, aches, ambulates with walker.    Family/ staff Communication: plan of care reviewed with the patient and charge nurse.   Labs/tests ordered:  none

## 2023-11-15 ENCOUNTER — Telehealth: Payer: Self-pay

## 2023-11-15 NOTE — Telephone Encounter (Signed)
 Copied from CRM [id number]. Topic: Referral - Request for Referral >> Nov 15, 2023 10:52 AM Laurier BROCKS wrote: Did the patient discuss referral with their provider in the last year? No  Appointment offered? No  Type of order/referral and detailed reason for visit: Hospice Referral- Patient has in stage dementia and malnutrition  Preference of office, provider, location: [facility] [phone]  If referral order, have you been seen by this specialty before? No (If Yes, this issue or another issue? When? Where?  Can we respond through MyChart? No

## 2023-12-24 ENCOUNTER — Non-Acute Institutional Stay (SKILLED_NURSING_FACILITY): Payer: Self-pay | Admitting: Nurse Practitioner

## 2023-12-24 ENCOUNTER — Encounter: Payer: Self-pay | Admitting: Nurse Practitioner

## 2023-12-24 DIAGNOSIS — R609 Edema, unspecified: Secondary | ICD-10-CM | POA: Diagnosis not present

## 2023-12-24 DIAGNOSIS — I471 Supraventricular tachycardia, unspecified: Secondary | ICD-10-CM

## 2023-12-24 DIAGNOSIS — K219 Gastro-esophageal reflux disease without esophagitis: Secondary | ICD-10-CM | POA: Diagnosis not present

## 2023-12-24 DIAGNOSIS — F0390 Unspecified dementia without behavioral disturbance: Secondary | ICD-10-CM

## 2023-12-24 DIAGNOSIS — F339 Major depressive disorder, recurrent, unspecified: Secondary | ICD-10-CM

## 2023-12-24 DIAGNOSIS — M150 Primary generalized (osteo)arthritis: Secondary | ICD-10-CM

## 2023-12-24 NOTE — Assessment & Plan Note (Signed)
 MOST: Comfort measures, no hospitalization, no IVF/ABT no behavioral issues. MMSE 1/30 08/06/23

## 2023-12-24 NOTE — Progress Notes (Signed)
 Location:   [facility] Room Number: 40 A Place of Service:  SNF (31) Provider: Larwance Cyncere Sontag NP  Sherlynn Madden, MD  Patient Care Team: Sherlynn Madden, MD as PCP - General (Internal Medicine)  Extended Emergency Contact Information Primary Emergency Contact: [name],[name] Mobile Phone: [phone] Relation: Nephew Secondary Emergency Contact: [name],[name] Mobile Phone: [phone] Relation: Friend  Code Status:  DNR Goals of care: Advanced Directive information    09/17/2023    9:23 AM  Advanced Directives  Does Patient Have a Medical Advance Directive? Yes  Type of Estate agent of [place];Living will;Out of facility DNR (pink MOST or yellow form)  Does patient want to make changes to medical advance directive? No - Patient declined  Copy of Healthcare Power of Attorney in Chart? Yes - validated most recent copy scanned in chart (See row information)  Pre-existing out of facility DNR order (yellow form or pink MOST form) Pink MOST form placed in chart (order not valid for inpatient use);Yellow form placed in chart (order not valid for inpatient use)     Chief Complaint  Patient presents with   Medical Management of Chronic Issues    HPI:  Pt is a 89 y.o. female seen today for medical management of chronic diseases.    Adult failure to thrive, MOST: Comfort measures, no hospitalization, no IVF/ABT  Hx of emphysema, stable.  Edema, not apparent, off Torsemide. BNP 798 08/26/21, echo 08/27/21 EF 60-65%, weight stable.              PSVT off Amiodarone  01/2020 in hospital due to prolonged QT interval. On Metoprolol  bid and prn, Eliquis              GERD, no further c/o abd discomfort or pain since PPI switched from Omeprazole  to Pantoprazole  10/05/22. Hgb 14.9 06/12/22             Depression, stable, off Sertraline .              CKD Bun/creat 22/1.0 06/12/22             Senile dementia since CVA, no behavioral issues. MMSE 1/30 08/06/23              11/02/19 CVA (MRI cevebral small acute infarcts in the posterior left hemisphere in the left PCA and MCA watershed area. Takes Eliquis .              Anemia, 01/21/20 Iron 44, ferritin 63, Vit B12 695, Hgb 14.9 06/12/22             Hyperlipidemia, off Atorvastatin , LDL 41 09/19/21             OA lower back, aches, ambulates with walker.   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 No pertinent surgical history.  Allergies  Allergen Reactions   Alphagan P [Brimonidine Tartrate] Other (See Comments)    Unknown reaction Listed on MAR   Azopt [Brinzolamide] Other (See Comments)    Unknown reaction Listed on MAR   Lumigan [Bimatoprost] Other (See Comments)    Unknown reaction Listed on MAR    Allergies as of 12/24/2023  Reactions   Alphagan P [brimonidine Tartrate] Other (See Comments)   Unknown reaction Listed on MAR   Azopt [brinzolamide] Other (See Comments)   Unknown reaction Listed on MAR   Lumigan [bimatoprost] Other (See Comments)   Unknown reaction Listed on [facility]        Medication List        Accurate as of December 24, 2023  2:14 PM. If you have any questions, ask your nurse or doctor.          apixaban  2.5 MG Tabs tablet Commonly known as: ELIQUIS  Take 1 tablet (2.5 mg total) by mouth 2 (two) times daily.   B COMPLEX VITAMINS (W/ FA) PO Take 1 tablet by mouth daily. Vitamin B complex - Folic Acid 0.4 mg   CALAMINE EX Apply 1 Application topically in the morning and at bedtime.   Calcium  Carbonate-Vitamin D 600-400 MG-UNIT tablet Take 1 tablet by mouth daily.   melatonin 3 MG Tabs tablet Take 3 mg by mouth at bedtime.    metoprolol  tartrate 25 MG tablet Commonly known as: LOPRESSOR  Take 12.5 mg by mouth as needed.  for SVT HOLD IF HR < 50   metoprolol  tartrate 25 MG tablet Commonly known as: LOPRESSOR  Take 12.5 mg by mouth 2 (two) times daily. for SVT HOLD IF BP <90/60, HR<50   omeprazole  20 MG capsule Commonly known as: PRILOSEC Take 20 mg by mouth daily.   PreviDent 5000 Booster Plus 1.1 % Pste Generic drug: Sodium Fluoride Place 1 application. onto teeth at bedtime.   timolol  0.5 % ophthalmic solution Commonly known as: TIMOPTIC  Place 1 drop into both eyes daily.        Review of Systems  Unable to perform ROS: Dementia    Immunization History  Administered Date(s) Administered   DTaP 09/05/2004   Fluad Quad(high Dose 65+) 02/09/2023   INFLUENZA, HIGH DOSE SEASONAL PF 01/06/2014, 11/08/2018   Influenza, Quadrivalent, Recombinant, Inj, Pf 01/09/2018, 01/21/2019   Influenza-Unspecified 11/07/2013, 01/14/2020, 01/26/2021, 01/31/2022   Moderna Covid-19 Vaccine  Bivalent Booster 32yrs & up 04/04/2021   Moderna SARS-COV2 Booster Vaccination 02/08/2022   Moderna Sars-Covid-2 Vaccination 05/09/2019, 06/06/2019, 02/22/2020, 09/06/2020   PFIZER(Purple Top)SARS-COV-2 Vaccination 01/23/2023   Pneumococcal Conjugate-13 01/06/2014   Pneumococcal Polysaccharide-23 09/05/2004   Pneumococcal-Unspecified 09/05/2004, 10/18/2014   Tdap 09/05/2004, 07/19/2022   Zoster Recombinant(Shingrix) 03/21/2021, 05/24/2021   Pertinent  Health Maintenance Due  Topic Date Due   Influenza Vaccine  11/08/2023   DEXA SCAN  Completed      08/26/2021    8:30 PM 08/27/2021   10:00 AM 01/09/2022   12:36 PM 06/25/2022   12:28 PM 07/19/2022   10:41 AM  Fall Risk  Falls in the past year?   0 0 0  Was there an injury with Fall?   0 0 0  Fall Risk Category Calculator   0 0 0  Fall Risk Category (Retired)   Low     (RETIRED) Patient Fall Risk Level High fall risk  High fall risk  Low fall risk     Patient at Risk for  Falls Due to   No Fall Risks No Fall Risks No Fall Risks  Fall risk Follow up   Falls evaluation completed  Falls evaluation completed Falls evaluation completed     Data saved with a previous flowsheet row definition   Functional Status Survey:    Vitals:   12/24/23 1206  BP: (!) 103/59  Pulse: 63  Resp: 17  Temp: 97.6 F (36.4  C)  SpO2: 97%  Weight: 95 lb 8 oz (43.3 kg)   Body mass index is 19.96 kg/m. Physical Exam Vitals and nursing note reviewed.  Constitutional:      Appearance: Normal appearance.  HENT:     Head: Normocephalic and atraumatic.     Nose: Nose normal.     Mouth/Throat:     Mouth: Mucous membranes are moist.  Eyes:     Extraocular Movements: Extraocular movements intact.     Conjunctiva/sclera: Conjunctivae normal.     Pupils: Pupils are equal, round, and reactive to light.  Cardiovascular:     Rate and Rhythm: Normal rate and regular rhythm.     Heart sounds: No murmur heard. Pulmonary:     Effort: Pulmonary effort is normal.     Breath sounds: No rales.  Abdominal:     General: Bowel sounds are normal.     Palpations: Abdomen is soft.     Tenderness: There is no abdominal tenderness.  Musculoskeletal:     Cervical back: Normal range of motion and neck supple.     Right lower leg: No edema.     Left lower leg: No edema.  Skin:    General: Skin is warm and dry.     Comments: Hammer toes most of her toes Yellow, thick toe nails.   Neurological:     General: No focal deficit present.     Mental Status: She is alert and oriented to person, place, and time. Mental status is at baseline.     Gait: Gait abnormal.     Comments: Ambulates with walker.   Psychiatric:        Mood and Affect: Mood normal.        Behavior: Behavior normal.        Thought Content: Thought content normal.     Labs reviewed: No results for input(s): NA, K, CL, CO2, GLUCOSE, BUN, CREATININE, CALCIUM , MG, PHOS in the last 8760 hours. No results  for input(s): AST, ALT, ALKPHOS, BILITOT, PROT, ALBUMIN in the last 8760 hours. No results for input(s): WBC, NEUTROABS, HGB, HCT, MCV, PLT in the last 8760 hours. Lab Results  Component Value Date   TSH 3.96 06/12/2022   Lab Results  Component Value Date   HGBA1C 5.9 (H) 11/04/2019   Lab Results  Component Value Date   CHOL 110 09/19/2021   HDL 55 09/19/2021   LDLCALC 41 09/19/2021   TRIG 56 09/19/2021   CHOLHDL 2.8 11/06/2019    Significant Diagnostic Results in last 30 days:  No results found.  Assessment/Plan  Major neurocognitive disorder (HCC) MOST: Comfort measures, no hospitalization, no IVF/ABT no behavioral issues. MMSE 1/30 08/06/23  Edema not apparent, off Torsemide. BNP 798 08/26/21, echo 08/27/21 EF 60-65%, weight stable.   Paroxysmal SVT (supraventricular tachycardia) (HCC) off Amiodarone  01/2020 in hospital due to prolonged QT interval. On Metoprolol  bid and prn, Eliquis   GERD (gastroesophageal reflux disease)  no further c/o abd discomfort or pain since PPI switched from Omeprazole  to Pantoprazole  10/05/22. Hgb 14.9 06/12/22  Depression, recurrent (HCC)  stable, off Sertraline .   Osteoarthritis, multiple sites aches, ambulates with walker.    Family/ staff Communication: Plan of care reviewed with the patient and charge nurse  Labs/tests ordered: None

## 2023-12-24 NOTE — Assessment & Plan Note (Signed)
not apparent, off Torsemide. BNP 798 08/26/21, echo 08/27/21 EF 60-65%, weight stable.

## 2023-12-24 NOTE — Assessment & Plan Note (Signed)
no further c/o abd discomfort or pain since PPI switched from Omeprazole to Pantoprazole 10/05/22. Hgb 14.9 06/12/22

## 2023-12-24 NOTE — Assessment & Plan Note (Signed)
stable, off Sertraline.  

## 2023-12-24 NOTE — Assessment & Plan Note (Signed)
aches, ambulates with walker.  

## 2023-12-24 NOTE — Assessment & Plan Note (Signed)
off Amiodarone 01/2020 in hospital due to prolonged QT interval. On Metoprolol bid and prn,  Eliquis.  

## 2024-01-14 ENCOUNTER — Non-Acute Institutional Stay (SKILLED_NURSING_FACILITY): Payer: Self-pay | Admitting: Nurse Practitioner

## 2024-01-14 ENCOUNTER — Encounter: Payer: Self-pay | Admitting: Nurse Practitioner

## 2024-01-14 DIAGNOSIS — F0390 Unspecified dementia without behavioral disturbance: Secondary | ICD-10-CM

## 2024-01-14 DIAGNOSIS — J439 Emphysema, unspecified: Secondary | ICD-10-CM

## 2024-01-14 DIAGNOSIS — I471 Supraventricular tachycardia, unspecified: Secondary | ICD-10-CM | POA: Diagnosis not present

## 2024-01-14 DIAGNOSIS — K219 Gastro-esophageal reflux disease without esophagitis: Secondary | ICD-10-CM

## 2024-01-14 DIAGNOSIS — N1831 Chronic kidney disease, stage 3a: Secondary | ICD-10-CM

## 2024-01-14 DIAGNOSIS — M150 Primary generalized (osteo)arthritis: Secondary | ICD-10-CM

## 2024-01-14 DIAGNOSIS — R609 Edema, unspecified: Secondary | ICD-10-CM | POA: Diagnosis not present

## 2024-01-14 DIAGNOSIS — D509 Iron deficiency anemia, unspecified: Secondary | ICD-10-CM

## 2024-01-14 DIAGNOSIS — F339 Major depressive disorder, recurrent, unspecified: Secondary | ICD-10-CM

## 2024-01-14 DIAGNOSIS — E782 Mixed hyperlipidemia: Secondary | ICD-10-CM

## 2024-01-14 NOTE — Progress Notes (Unsigned)
 Location:  [facility] Room Number: N040-A Place of Service:  SNF (31) Provider: Tashanti Dalporto X, NP    Patient Care Team: Sherlynn Madden, MD as PCP - General (Internal Medicine)  Extended Emergency Contact Information Primary Emergency Contact: [name],[name] Mobile Phone: [phone] Relation: Nephew Secondary Emergency Contact: [name],[name] Mobile Phone: [phone] Relation: Friend  Code Status:  DNR Goals of care: Advanced Directive information    01/14/2024   11:01 AM  Advanced Directives  Does Patient Have a Medical Advance Directive? Yes  Type of Estate agent of [place];Living will;Out of facility DNR (pink MOST or yellow form)  Does patient want to make changes to medical advance directive? No - Patient declined  Copy of Healthcare Power of Attorney in Chart? Yes - validated most recent copy scanned in chart (See row information)  Pre-existing out of facility DNR order (yellow form or pink MOST form) Pink MOST form placed in chart (order not valid for inpatient use)     Chief Complaint  Patient presents with  . Medical Management of Chronic Issues    Routine Visit, need to discuss flu and covid vaccine    HPI:  Pt is a 89 y.o. female seen today for medical management of chronic diseases.    Adult failure to thrive, MOST: Comfort measures, no hospitalization, no IVF/ABT             Hx of emphysema, stable.  Edema, not apparent, off Torsemide. BNP 798 08/26/21, echo 08/27/21 EF 60-65%, weight stable.              PSVT off Amiodarone  01/2020 in hospital due to prolonged QT interval. On Metoprolol  bid and prn, Eliquis              GERD, no further c/o abd discomfort or pain since PPI switched from Omeprazole  to Pantoprazole  10/05/22. Hgb 14.9 06/12/22             Depression, stable, off Sertraline .              CKD Bun/creat 22/1.0 06/12/22             Senile dementia since CVA, no behavioral issues. MMSE 1/30 08/06/23              11/02/19 CVA (MRI cevebral small acute infarcts in the posterior left hemisphere in the left PCA and MCA watershed area. Takes Eliquis .              Anemia, 01/21/20 Iron 44, ferritin 63, Vit B12 695, Hgb 14.9 06/12/22             Hyperlipidemia, off Atorvastatin , LDL 41 09/19/21             OA lower back, aches, ambulates with walker.     Past Medical History:  Diagnosis Date  . Atrial flutter, paroxysmal (HCC)   . COVID-19   . Dementia (HCC)   . Diastolic dysfunction   . Hyperlipidemia   . Hypertension   . PSVT (paroxysmal supraventricular tachycardia)   . Stroke (HCC)   . 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 No pertinent surgical history.  Allergies  Allergen Reactions  . Alphagan P [Brimonidine Tartrate] Other (See Comments)    Unknown reaction Listed on MAR  . Azopt [Brinzolamide] Other (See Comments)    Unknown reaction Listed on MAR  . Lumigan [Bimatoprost] Other (See Comments)    Unknown reaction Listed on  MAR    Outpatient Encounter Medications as of 01/14/2024  Medication Sig  . acetaminophen  (TYLENOL ) 325 MG tablet Take 650 mg by mouth every 4 (four) hours as needed for fever or moderate pain (pain score 4-6).  . apixaban  (ELIQUIS ) 2.5 MG TABS tablet Take 1 tablet (2.5 mg total) by mouth 2 (two) times daily.  . B Complex-Folic Acid (B COMPLEX VITAMINS, W/ FA, PO) Take 1 tablet by mouth daily. Vitamin B complex - Folic Acid 0.4 mg  . Calcium  Carbonate-Vitamin D 600-400 MG-UNIT tablet Take 1 tablet by mouth daily.  SABRA lactose free nutrition (BOOST) LIQD Take 237 mLs by mouth 2 (two) times daily between meals.  . melatonin 3 MG TABS tablet Take 3 mg by mouth at bedtime.  . metoprolol  tartrate (LOPRESSOR ) 25 MG tablet Take 12.5 mg by mouth as needed.  for SVT HOLD IF HR < 50  . metoprolol  tartrate (LOPRESSOR ) 25 MG tablet Take 12.5 mg by mouth 2 (two) times daily. for SVT HOLD IF BP  <90/60, HR<50  . Multiple Vitamin (THEREMS) TABS Take 1 tablet by mouth daily in the afternoon.  . omeprazole  (PRILOSEC) 20 MG capsule Take 20 mg by mouth daily.  . Sodium Fluoride (PREVIDENT 5000 BOOSTER PLUS) 1.1 % PSTE Place 1 application. onto teeth at bedtime.  . timolol  (TIMOPTIC ) 0.5 % ophthalmic solution Place 1 drop into both eyes daily.  SABRA CALAMINE EX Apply 1 Application topically in the morning and at bedtime. (Patient not taking: Reported on 01/14/2024)   No facility-administered encounter medications on file as of 01/14/2024.    Review of Systems  Unable to perform ROS: Dementia    Immunization History  Administered Date(s) Administered  . DTaP 09/05/2004  . Fluad Quad(high Dose 65+) 02/09/2023  . INFLUENZA, HIGH DOSE SEASONAL PF 01/06/2014, 11/08/2018  . Influenza, Quadrivalent, Recombinant, Inj, Pf 01/09/2018, 01/21/2019  . Influenza-Unspecified 11/07/2013, 01/14/2020, 01/26/2021, 01/31/2022  . Moderna Covid-19 Vaccine  Bivalent Booster 51yrs & up 04/04/2021  . Moderna SARS-COV2 Booster Vaccination 02/08/2022  . Moderna Sars-Covid-2 Vaccination 05/09/2019, 06/06/2019, 02/22/2020, 09/06/2020  . PFIZER(Purple Top)SARS-COV-2 Vaccination 01/23/2023  . Pneumococcal Conjugate-13 01/06/2014  . Pneumococcal Polysaccharide-23 09/05/2004  . Pneumococcal-Unspecified 09/05/2004, 10/18/2014  . Tdap 09/05/2004, 07/19/2022  . Zoster Recombinant(Shingrix) 03/21/2021, 05/24/2021   Pertinent  Health Maintenance Due  Topic Date Due  . Influenza Vaccine  11/08/2023  . DEXA SCAN  Completed      08/26/2021    8:30 PM 08/27/2021   10:00 AM 01/09/2022   12:36 PM 06/25/2022   12:28 PM 07/19/2022   10:41 AM  Fall Risk  Falls in the past year?   0 0 0  Was there an injury with Fall?   0 0 0  Fall Risk Category Calculator   0 0 0  Fall Risk Category (Retired)   Low     (RETIRED) Patient Fall Risk Level High fall risk  High fall risk  Low fall risk     Patient at Risk for Falls Due to   No  Fall Risks No Fall Risks No Fall Risks  Fall risk Follow up   Falls evaluation completed  Falls evaluation completed Falls evaluation completed     Data saved with a previous flowsheet row definition   Functional Status Survey:    Vitals:   01/14/24 1043  BP: 124/64  Pulse: 72  Resp: 18  Temp: 97.6 F (36.4 C)  SpO2: 97%  Weight: 97 lb 9.6 oz (44.3 kg)  Height: 4' 10 (1.473  m)   Body mass index is 20.4 kg/m. Physical Exam Vitals and nursing note reviewed.  Constitutional:      Appearance: Normal appearance.  HENT:     Head: Normocephalic and atraumatic.     Nose: Nose normal.     Mouth/Throat:     Mouth: Mucous membranes are moist.  Eyes:     Extraocular Movements: Extraocular movements intact.     Conjunctiva/sclera: Conjunctivae normal.     Pupils: Pupils are equal, round, and reactive to light.  Cardiovascular:     Rate and Rhythm: Normal rate and regular rhythm.     Heart sounds: No murmur heard. Pulmonary:     Effort: Pulmonary effort is normal.     Breath sounds: No rales.  Abdominal:     General: Bowel sounds are normal.     Palpations: Abdomen is soft.     Tenderness: There is no abdominal tenderness.  Musculoskeletal:     Cervical back: Normal range of motion and neck supple.     Right lower leg: No edema.     Left lower leg: No edema.  Skin:    General: Skin is warm and dry.     Comments: Hammer toes most of her toes Yellow, thick toe nails.   Neurological:     General: No focal deficit present.     Mental Status: She is alert and oriented to person, place, and time. Mental status is at baseline.     Gait: Gait abnormal.     Comments: Ambulates with walker.   Psychiatric:        Mood and Affect: Mood normal.        Behavior: Behavior normal.        Thought Content: Thought content normal.     Labs reviewed: No results for input(s): NA, K, CL, CO2, GLUCOSE, BUN, CREATININE, CALCIUM , MG, PHOS in the last 8760 hours. No  results for input(s): AST, ALT, ALKPHOS, BILITOT, PROT, ALBUMIN in the last 8760 hours. No results for input(s): WBC, NEUTROABS, HGB, HCT, MCV, PLT in the last 8760 hours. Lab Results  Component Value Date   TSH 3.96 06/12/2022   Lab Results  Component Value Date   HGBA1C 5.9 (H) 11/04/2019   Lab Results  Component Value Date   CHOL 110 09/19/2021   HDL 55 09/19/2021   LDLCALC 41 09/19/2021   TRIG 56 09/19/2021   CHOLHDL 2.8 11/06/2019    Significant Diagnostic Results in last 30 days:  No results found.  Assessment/Plan No problem-specific Assessment & Plan notes found for this encounter.     Family/ staff Communication: Plan of care reviewed with the patient and charge nurse  Labs/tests ordered: None

## 2024-01-14 NOTE — Assessment & Plan Note (Signed)
 Heart rate is controlled, off Amiodarone  01/2020 in hospital due to prolonged QT interval. On Metoprolol  bid and prn, Eliquis 

## 2024-01-14 NOTE — Assessment & Plan Note (Signed)
01/21/20 Iron 44, ferritin 63, Vit B12 695, Hgb 14.9 06/12/22 

## 2024-01-14 NOTE — Assessment & Plan Note (Signed)
Bun/creat 22/1.0 06/12/22 

## 2024-01-14 NOTE — Assessment & Plan Note (Signed)
not apparent, off Torsemide. BNP 798 08/26/21, echo 08/27/21 EF 60-65%, weight stable.

## 2024-01-14 NOTE — Assessment & Plan Note (Signed)
 Senile dementia since CVA, no behavioral issues. MMSE 1/30 08/06/23 Adult failure to thrive, MOST: Comfort measures, no hospitalization, no IVF/ABT

## 2024-01-14 NOTE — Assessment & Plan Note (Signed)
 off Atorvastatin, LDL 41 09/19/21

## 2024-01-14 NOTE — Assessment & Plan Note (Signed)
no further c/o abd discomfort or pain since PPI switched from Omeprazole to Pantoprazole 10/05/22. Hgb 14.9 06/12/22

## 2024-01-14 NOTE — Assessment & Plan Note (Signed)
Her mood is stable, off Sertraline.

## 2024-01-14 NOTE — Assessment & Plan Note (Signed)
 Stable, on and off hacking cough

## 2024-01-14 NOTE — Assessment & Plan Note (Signed)
 Able to ambulate with walker on the unit

## 2024-01-30 ENCOUNTER — Encounter: Payer: Self-pay | Admitting: Sports Medicine

## 2024-01-30 ENCOUNTER — Non-Acute Institutional Stay (SKILLED_NURSING_FACILITY): Payer: Self-pay | Admitting: Sports Medicine

## 2024-01-30 DIAGNOSIS — J3489 Other specified disorders of nose and nasal sinuses: Secondary | ICD-10-CM

## 2024-01-30 DIAGNOSIS — F0390 Unspecified dementia without behavioral disturbance: Secondary | ICD-10-CM

## 2024-01-30 DIAGNOSIS — I471 Supraventricular tachycardia, unspecified: Secondary | ICD-10-CM | POA: Diagnosis not present

## 2024-01-30 LAB — BASIC METABOLIC PANEL WITH GFR
BUN: 24 — AB (ref 4–21)
CO2: 23 — AB (ref 13–22)
Chloride: 105 (ref 99–108)
Creatinine: 1 (ref 0.5–1.1)
Glucose: 132
Potassium: 4.4 meq/L (ref 3.5–5.1)
Sodium: 140 (ref 137–147)

## 2024-01-30 LAB — COMPREHENSIVE METABOLIC PANEL WITH GFR
Calcium: 8.2 — AB (ref 8.7–10.7)
eGFR: 52

## 2024-01-30 NOTE — Progress Notes (Signed)
 Location:  [facility] Room Number: N040-A Place of Service:  SNF (31) Provider:  Sherlynn Madden, MD   Sherlynn Madden, MD  Patient Care Team: Sherlynn Madden, MD as PCP - General (Internal Medicine)  Extended Emergency Contact Information Primary Emergency Contact: [name],[name] Mobile Phone: [phone] Relation: Nephew Secondary Emergency Contact: [name],[name] Mobile Phone: [phone] Relation: Friend  Code Status:  DNR  Goals of care: Advanced Directive information    01/30/2024   10:07 AM  Advanced Directives  Type of Advance Directive Healthcare Power of [place];Out of facility DNR (pink MOST or yellow form)  Does patient want to make changes to medical advance directive? No - Patient declined  Copy of Healthcare Power of Attorney in Chart? Yes - validated most recent copy scanned in chart (See row information)  Pre-existing out of facility DNR order (yellow form or pink MOST form) Pink MOST form placed in chart (order not valid for inpatient use);Yellow form placed in chart (order not valid for inpatient use)     Chief Complaint  Patient presents with   Acute Visit    Rt cheek is swelling.     HPI:  Pt is a 89 y.o. female with PMH of PVST, GERD, depression, CKD, anemia, HLD is seen today for an acute visit for tachycardia and  left facial swelling  As per nursing staff pt has baseline confusion, has slight swelling and bruise on left forehead. Pt ambulates with a walker no reports of fall noted. Pt knows her name , she is laying on the bed. 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 No pertinent surgical history.  Allergies  Allergen Reactions   Alphagan P [Brimonidine Tartrate] Other (See Comments)    Unknown reaction Listed on MAR   Azopt [Brinzolamide] Other (See Comments)    Unknown reaction Listed on MAR   Lumigan [Bimatoprost] Other (See Comments)    Unknown reaction Listed on MAR    Allergies as of 01/30/2024       Reactions   Alphagan P [brimonidine Tartrate] Other (See Comments)   Unknown reaction Listed on MAR   Azopt [brinzolamide] Other (See Comments)   Unknown reaction Listed on MAR   Lumigan [bimatoprost] Other (See Comments)   Unknown reaction Listed on [facility]        Medication List        Accurate as of January 30, 2024 10:07 AM. If you have any questions, ask your nurse or doctor.          acetaminophen  325 MG tablet Commonly known as: TYLENOL  Take 650 mg by mouth every 4 (four) hours as needed for fever or moderate pain (pain score 4-6).   apixaban  2.5 MG Tabs tablet Commonly known as: ELIQUIS  Take 1 tablet (2.5 mg total) by mouth 2 (  two) times daily.   B COMPLEX VITAMINS (W/ FA) PO Take 1 tablet by mouth daily. Vitamin B complex - Folic Acid 0.4 mg   CALAMINE EX Apply 1 Application topically in the morning and at bedtime.   Calcium  Carbonate-Vitamin D 600-400 MG-UNIT tablet Take 1 tablet by mouth daily.   lactose free nutrition Liqd Take 237 mLs by mouth 2 (two) times daily between meals.   melatonin 3 MG Tabs tablet Take 3 mg by mouth at bedtime.   metoprolol  tartrate 25 MG tablet Commonly known as: LOPRESSOR  Take 12.5 mg by mouth as needed.  for SVT HOLD IF HR < 50   metoprolol  tartrate 25 MG tablet Commonly known as:  LOPRESSOR  Take 12.5 mg by mouth 2 (two) times daily. for SVT HOLD IF BP <90/60, HR<50   omeprazole  20 MG capsule Commonly known as: PRILOSEC Take 20 mg by mouth daily.   PreviDent 5000 Booster Plus 1.1 % Pste Generic drug: Sodium Fluoride Place 1 application. onto teeth at bedtime.   Therems Tabs Take 1 tablet by mouth daily in the afternoon.   timolol  0.5 % ophthalmic solution Commonly known as: TIMOPTIC  Place 1 drop into both eyes daily.        Review of Systems  Constitutional:  Negative for fever.  HENT:  Negative for sore throat.   Respiratory:  Negative for cough and shortness of breath.   Cardiovascular:  Negative for chest pain, palpitations and leg swelling.  Gastrointestinal:  Negative for abdominal pain, blood in stool, diarrhea and nausea.  Genitourinary:  Negative for dysuria.  Neurological:  Negative for dizziness.  Psychiatric/Behavioral:  Negative for confusion.     Immunization History  Administered Date(s) Administered   DTaP 09/05/2004   Fluad Quad(high Dose 65+) 02/09/2023   INFLUENZA, HIGH DOSE SEASONAL PF 01/06/2014, 11/08/2018   Influenza, Quadrivalent, Recombinant, Inj, Pf 01/09/2018, 01/21/2019   Influenza-Unspecified 11/07/2013, 01/14/2020, 01/26/2021, 01/31/2022, 01/14/2024   Moderna Covid-19 Vaccine  Bivalent Booster 24yrs & up 04/04/2021   Moderna SARS-COV2 Booster Vaccination 02/08/2022   Moderna Sars-Covid-2 Vaccination 05/09/2019, 06/06/2019, 02/22/2020, 09/06/2020   PFIZER(Purple Top)SARS-COV-2 Vaccination 01/23/2023   Pneumococcal Conjugate-13 01/06/2014   Pneumococcal Polysaccharide-23 09/05/2004   Pneumococcal-Unspecified 09/05/2004, 10/18/2014   Tdap 09/05/2004, 07/19/2022   Zoster Recombinant(Shingrix) 03/21/2021, 05/24/2021   Pertinent  Health Maintenance Due  Topic Date Due   Influenza Vaccine  Completed   DEXA SCAN  Completed      08/26/2021    8:30 PM 08/27/2021   10:00 AM 01/09/2022   12:36 PM 06/25/2022   12:28 PM  07/19/2022   10:41 AM  Fall Risk  Falls in the past year?   0 0 0  Was there an injury with Fall?   0 0 0  Fall Risk Category Calculator   0 0 0  Fall Risk Category (Retired)   Low     (RETIRED) Patient Fall Risk Level High fall risk  High fall risk  Low fall risk     Patient at Risk for Falls Due to   No Fall Risks No Fall Risks No Fall Risks  Fall risk Follow up   Falls evaluation completed  Falls evaluation completed Falls evaluation completed     Data saved with a previous flowsheet row definition   Functional Status Survey:    Vitals:   01/30/24 1003  BP: 109/61  Pulse: 97  Temp: (!) 97.2 F (36.2 C)  SpO2: 97%  Weight: 97 lb 9.6 oz (44.3 kg)  Height: 4' 10 (1.473  m)   Body mass index is 20.4 kg/m. Physical Exam Constitutional:      Appearance: Normal appearance.  HENT:     Head: Normocephalic and atraumatic.     Mouth/Throat:     Comments: Left maxillary sinus tenderness Cardiovascular:     Rate and Rhythm: Normal rate and regular rhythm.  Pulmonary:     Effort: Pulmonary effort is normal. No respiratory distress.     Breath sounds: Normal breath sounds. No wheezing.  Abdominal:     General: Bowel sounds are normal. There is no distension.     Tenderness: There is no abdominal tenderness. There is no guarding or rebound.     Comments:    Musculoskeletal:        General: No swelling.  Neurological:     Mental Status: She is alert. Mental status is at baseline.     Motor: No weakness.     Labs reviewed: No results for input(s): NA, K, CL, CO2, GLUCOSE, BUN, CREATININE, CALCIUM , MG, PHOS in the last 8760 hours. No results for input(s): AST, ALT, ALKPHOS, BILITOT, PROT, ALBUMIN in the last 8760 hours. No results for input(s): WBC, NEUTROABS, HGB, HCT, MCV, PLT in the last 8760 hours. Lab Results  Component Value Date   TSH 3.96 06/12/2022   Lab Results  Component Value Date   HGBA1C 5.9 (H) 11/04/2019   Lab  Results  Component Value Date   CHOL 110 09/19/2021   HDL 55 09/19/2021   LDLCALC 41 09/19/2021   TRIG 56 09/19/2021   CHOLHDL 2.8 11/06/2019    Significant Diagnostic Results in last 30 days:  No results found.  Assessment/Plan   1. Tenderness over maxillary sinus (Primary) Mild left facial swelling  Mild tenderness noted on left maxillary area Will start doxycycline   2. Paroxysmal SVT (supraventricular tachycardia) Will give 1 time dose of metoprolol   Monitor vitals  Family aware Will check labs  3. Major neurocognitive disorder (HCC)  Cont assistance with ADLS

## 2024-01-31 LAB — CBC AND DIFFERENTIAL
HCT: 39 (ref 36–46)
Hemoglobin: 12.2 (ref 12.0–16.0)
Platelets: 338 K/uL (ref 150–400)
WBC: 9.2

## 2024-01-31 LAB — CBC: RBC: 3.89 (ref 3.87–5.11)

## 2024-02-03 ENCOUNTER — Encounter: Payer: Self-pay | Admitting: Sports Medicine

## 2024-02-14 ENCOUNTER — Non-Acute Institutional Stay (SKILLED_NURSING_FACILITY): Payer: Self-pay | Admitting: Family Medicine

## 2024-02-14 ENCOUNTER — Encounter: Payer: Self-pay | Admitting: Family Medicine

## 2024-02-14 DIAGNOSIS — F0390 Unspecified dementia without behavioral disturbance: Secondary | ICD-10-CM

## 2024-02-14 DIAGNOSIS — N1831 Chronic kidney disease, stage 3a: Secondary | ICD-10-CM

## 2024-02-14 DIAGNOSIS — J439 Emphysema, unspecified: Secondary | ICD-10-CM

## 2024-02-14 DIAGNOSIS — I4892 Unspecified atrial flutter: Secondary | ICD-10-CM

## 2024-02-14 DIAGNOSIS — E782 Mixed hyperlipidemia: Secondary | ICD-10-CM | POA: Diagnosis not present

## 2024-02-14 NOTE — Assessment & Plan Note (Signed)
 No recent exacerbation No distress today's exam

## 2024-02-14 NOTE — Assessment & Plan Note (Signed)
 No change; unable to answer questions

## 2024-02-14 NOTE — Progress Notes (Signed)
 Location:  [facility] Room Number: [facility] [phone] Place of Service:  SNF [id number]) Provider:  Cleotilde Garnette HERO, MD  Patient Care Team: Cleotilde Garnette HERO, MD as PCP - General (Family Medicine)  Extended Emergency Contact Information Primary Emergency Contact: [name],[name] Mobile Phone: [phone] Relation: Nephew Secondary Emergency Contact: [name],[name] Mobile Phone: [phone] Relation: Friend  Code Status:  DNR Goals of care: Advanced Directive information    01/30/2024   10:07 AM  Advanced Directives  Type of Advance Directive Healthcare Power of [place];Out of facility DNR (pink MOST or yellow form)  Does patient want to make changes to medical advance directive? No - Patient declined  Copy of Healthcare Power of Attorney in Chart? Yes - validated most recent copy scanned in chart (See row information)  Pre-existing out of facility DNR order (yellow form or pink MOST form) Pink MOST form placed in chart (order not valid for inpatient use);Yellow form placed in chart (order not valid for inpatient use)     Chief Complaint  Patient presents with   Medical Management of Chronic Issues    Medical Management of Chronic Issues.     HPI:  Pt is a 89 y.o. female seen today for medical management of chronic diseases.  Chronic problems include severe dementia, atrial flutter, hyperlipidemia, old stroke, and wet macular degeneration.  I saw her in her room today prior to lunch.  She was unable to respond to most simple questions but did say that she was ready for lunch.  She ambulates with a walker.  There has been no history of falls. She voices no complaints today. 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 No pertinent surgical history.  Allergies  Allergen Reactions   Alphagan P [Brimonidine Tartrate] Other (See Comments)    Unknown reaction Listed on MAR   Azopt [Brinzolamide] Other (See Comments)    Unknown reaction Listed on MAR   Lumigan [Bimatoprost] Other (See Comments)    Unknown reaction Listed on [facility]    Outpatient Encounter Medications as of 02/14/2024  Medication Sig   acetaminophen  (TYLENOL ) 325 MG tablet Take 650 mg by mouth every 4 (four) hours as needed for fever or moderate pain (pain score 4-6).   apixaban  (ELIQUIS ) 2.5 MG TABS tablet Take 1 tablet (2.5 mg total) by mouth 2 (two) times daily.   lactose free nutrition (BOOST) LIQD Take 237 mLs by mouth 2 (two) times daily between meals.   melatonin 3 MG TABS tablet Take 3 mg by mouth at bedtime.   metoprolol  tartrate (LOPRESSOR ) 25 MG tablet Take 12.5 mg by mouth as needed.  for SVT HOLD IF HR < 50   metoprolol  tartrate (LOPRESSOR ) 25 MG tablet Take 12.5 mg by mouth 2 (two) times daily. for SVT HOLD IF BP <90/60, HR<50   Multiple Vitamin (THEREMS) TABS Take 1 tablet by mouth daily in the afternoon.   omeprazole  (PRILOSEC) 20 MG capsule Take 20 mg by mouth daily.   timolol  (TIMOPTIC ) 0.5 % ophthalmic solution Place 1 drop into both eyes daily.   B Complex-Folic Acid (B COMPLEX  VITAMINS, W/ FA, PO) Take 1 tablet by mouth daily. Vitamin B complex - Folic Acid 0.4 mg (Patient not taking: Reported on 02/14/2024)   CALAMINE EX Apply 1 Application topically in the morning and at bedtime. (Patient not taking: Reported on 02/14/2024)   Calcium  Carbonate-Vitamin D 600-400 MG-UNIT tablet Take 1 tablet by mouth daily. (Patient not taking: Reported on 02/14/2024)   Sodium Fluoride (PREVIDENT 5000 BOOSTER PLUS) 1.1 % PSTE Place 1 application. onto teeth at bedtime. (Patient not taking: Reported on 02/14/2024)   No  facility-administered encounter medications on file as of 02/14/2024.    Review of Systems  Unable to perform ROS: Dementia    Immunization History  Administered Date(s) Administered   DTaP 09/05/2004   Fluad Quad(high Dose 65+) 02/09/2023   INFLUENZA, HIGH DOSE SEASONAL PF 01/06/2014, 11/08/2018   Influenza, Quadrivalent, Recombinant, Inj, Pf 01/09/2018, 01/21/2019   Influenza-Unspecified 11/07/2013, 01/14/2020, 01/26/2021, 01/31/2022, 01/14/2024   Moderna Covid-19 Vaccine  Bivalent Booster 15yrs & up 04/04/2021   Moderna SARS-COV2 Booster Vaccination 02/08/2022   Moderna Sars-Covid-2 Vaccination 05/09/2019, 06/06/2019, 02/22/2020, 09/06/2020   PFIZER(Purple Top)SARS-COV-2 Vaccination 01/23/2023   Pneumococcal Conjugate-13 01/06/2014   Pneumococcal Polysaccharide-23 09/05/2004, 11/19/2021   Pneumococcal-Unspecified 09/05/2004, 10/18/2014   Tdap 09/05/2004, 07/19/2022   Zoster Recombinant(Shingrix) 03/21/2021, 05/24/2021   Pertinent  Health Maintenance Due  Topic Date Due   Influenza Vaccine  Completed   DEXA SCAN  Completed      08/26/2021    8:30 PM 08/27/2021   10:00 AM 01/09/2022   12:36 PM 06/25/2022   12:28 PM 07/19/2022   10:41 AM  Fall Risk  Falls in the past year?   0 0 0  Was there an injury with Fall?   0 0 0  Fall Risk Category Calculator   0 0 0  Fall Risk Category (Retired)   Low     (RETIRED) Patient Fall Risk Level High fall risk  High fall risk  Low fall risk     Patient at Risk for Falls Due to   No Fall Risks No Fall Risks No Fall Risks  Fall risk Follow up   Falls evaluation completed  Falls evaluation completed Falls evaluation completed     Data saved with a previous flowsheet row definition   Functional Status Survey:    Vitals:   02/14/24 0811  BP: 108/72  Pulse: 100  Resp: 20  Temp: 98.4 F (36.9 C)  SpO2: 95%  Weight: 101 lb 1.6 oz (45.9 kg)  Height: 4' 10 (1.473 m)   Body mass index is 21.13 kg/m. Physical Exam Nursing note  reviewed.  HENT:     Head: Normocephalic.  Cardiovascular:     Rate and Rhythm: Tachycardia present.     Heart sounds: Normal heart sounds.  Pulmonary:     Effort: Pulmonary effort is normal.     Breath sounds: Normal breath sounds.  Neurological:     General: No focal deficit present.     Mental Status: She is confused.     Cranial Nerves: Cranial nerves 2-12 are intact.     Motor: Motor function is intact.     Gait: Gait is intact.     Labs reviewed: Recent Labs    01/30/24 0000  NA 140  K 4.4  CL 105  CO2 23*  BUN 24*  CREATININE 1.0  CALCIUM  8.2*   No results for input(s): AST, ALT, ALKPHOS, BILITOT, PROT, ALBUMIN in the last 8760 hours. Recent Labs    01/31/24 0000  WBC 9.2  HGB 12.2  HCT 39  PLT 338   Lab Results  Component Value Date   TSH 3.96 06/12/2022   Lab Results  Component Value Date   HGBA1C 5.9 (H) 11/04/2019   Lab Results  Component Value Date   CHOL 110 09/19/2021   HDL 55 09/19/2021   LDLCALC 41 09/19/2021   TRIG 56 09/19/2021   CHOLHDL 2.8 11/06/2019    Significant Diagnostic Results in last 30 days:  No results found.  Assessment/Plan There are no diagnoses linked to this encounter. .prob Patient Active Problem List   Diagnosis Date Noted   Osteoarthritis, multiple sites 04/27/2022   Hammer toes, bilateral 01/01/2022   Onychomycosis 01/01/2022   Pain of upper abdomen 09/25/2021   Chest pain 08/26/2021   Junctional tachycardia 08/13/2021   Chest wall pain 01/19/2021   Left knee pain 01/11/2021   Weight gain 07/06/2020   Anemia, iron deficiency 01/22/2020   Syncope    Prolonged QT interval    Paroxysmal atrial flutter (HCC)    Long term current use of anticoagulant    Mixed hyperlipidemia    History of stroke    Diastolic dysfunction    Fall 01/09/2020   Depression, recurrent 11/07/2019   Stroke (HCC) 11/05/2019   Tachycardia 11/03/2019   Hypotension 11/03/2019   Sinus bradycardia 11/02/2019   Acute  lower UTI 11/02/2019   Major neurocognitive disorder (HCC) 10/26/2019   CKD (chronic kidney disease) stage 3, GFR 30-59 ml/min (HCC) 09/30/2019   Edema 06/11/2019   Metatarsalgia 06/11/2019   GERD (gastroesophageal reflux disease) 05/14/2019   Paroxysmal SVT (supraventricular tachycardia) 04/27/2019   Pneumonia due to COVID-19 virus 04/27/2019   Chronic diarrhea 04/27/2019   Urinary frequency 04/27/2019   Protein-calorie malnutrition 04/27/2019   Emphysema of lung (HCC) 04/27/2019    Emphysema of lung (HCC) No recent exacerbation No distress today's exam  Major neurocognitive disorder (HCC) No change; unable to answer questions  CKD (chronic kidney disease) stage 3, GFR 30-59 ml/min (HCC) Recent labs(10-25) with no change  Family/ staff Communication:   Labs/tests ordered:

## 2024-02-14 NOTE — Assessment & Plan Note (Signed)
 Recent labs(10-25) with no change

## 2024-03-16 ENCOUNTER — Encounter: Payer: Self-pay | Admitting: Nurse Practitioner

## 2024-03-16 ENCOUNTER — Non-Acute Institutional Stay (SKILLED_NURSING_FACILITY): Payer: Self-pay | Admitting: Nurse Practitioner

## 2024-03-16 DIAGNOSIS — Z8673 Personal history of transient ischemic attack (TIA), and cerebral infarction without residual deficits: Secondary | ICD-10-CM

## 2024-03-16 DIAGNOSIS — I4892 Unspecified atrial flutter: Secondary | ICD-10-CM

## 2024-03-16 DIAGNOSIS — F0390 Unspecified dementia without behavioral disturbance: Secondary | ICD-10-CM

## 2024-03-16 DIAGNOSIS — K219 Gastro-esophageal reflux disease without esophagitis: Secondary | ICD-10-CM

## 2024-03-16 DIAGNOSIS — M150 Primary generalized (osteo)arthritis: Secondary | ICD-10-CM

## 2024-03-16 NOTE — Assessment & Plan Note (Signed)
11/02/19 CVA (MRI cevebral small acute infarcts in the posterior left hemisphere in the left PCA and MCA watershed area. Takes Eliquis.

## 2024-03-16 NOTE — Assessment & Plan Note (Signed)
 off Amiodarone  01/2020 in hospital due to prolonged QT interval. On Metoprolol  bid and prn, Eliquis  Tachycardia and bradycardia both

## 2024-03-16 NOTE — Progress Notes (Unsigned)
 Location:   [facility] Room Number: 40A Place of Service:    Provider: [facility] Bianca Wrightsman NP  Cleotilde Garnette HERO, MD  Patient Care Team: Cleotilde Garnette HERO, MD as PCP - General (Family Medicine)  Extended Emergency Contact Information Primary Emergency Contact: [name],[name] Mobile Phone: [phone] Relation: Nephew Secondary Emergency Contact: [name],[name] Mobile Phone: [phone] Relation: Friend  Code Status:  DNR Goals of care: Advanced Directive information    01/30/2024   10:07 AM  Advanced Directives  Type of Advance Directive Healthcare Power of [place];Out of facility DNR (pink MOST or yellow form)  Does patient want to make changes to medical advance directive? No - Patient declined  Copy of Healthcare Power of Attorney in Chart? Yes - validated most recent copy scanned in chart (See row information)  Pre-existing out of facility DNR order (yellow form or pink MOST form) Pink MOST form placed in chart (order not valid for inpatient use);Yellow form placed in chart (order not valid for inpatient use)     Chief Complaint  Patient presents with  . Medical Management of Chronic Issues    HPI:  Pt is a 89 y.o. [name] seen today for medical management of chronic diseases.       Adult failure to thrive, MOST: Comfort measures, no hospitalization, no IVF/ABT             Hx of emphysema, stable.  Edema, not apparent, off Torsemide. BNP 798 08/26/21, echo 08/27/21 EF 60-65%, weight stable.              PSVT off Amiodarone  01/2020 in hospital due to prolonged QT interval. On Metoprolol  bid and prn, Eliquis              GERD, no further c/o abd discomfort or pain, on PPI, Hgb 14.9 06/12/22             Depression, stable, off Sertraline .              CKD Bun/creat 22/1.0 06/12/22             Senile dementia since CVA, no behavioral issues. MMSE 1/30 4/29/Bianca             11/02/19 CVA (MRI cevebral small acute infarcts in the posterior left hemisphere in the left PCA and MCA  watershed area. Takes Eliquis .              Anemia, 01/21/20 Iron 44, ferritin 63, Vit B12 695, Hgb 14.9 06/12/22             Hyperlipidemia, off Atorvastatin , LDL 41 09/19/21             OA lower back, aches, ambulates with walker.   Past Medical History:  Diagnosis Date  . Atrial flutter, paroxysmal (HCC)   . COVID-19   . Dementia (HCC)   . Diastolic dysfunction   . Hyperlipidemia   . Hypertension   . PSVT (paroxysmal supraventricular tachycardia)   . Stroke (HCC)   . 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 No pertinent surgical history.  Allergies  Allergen Reactions  . Alphagan P [Brimonidine Tartrate] Other (See Comments)    Unknown reaction Listed on MAR  . Azopt [Brinzolamide] Other (See Comments)    Unknown reaction Listed on MAR  . Lumigan [Bimatoprost] Other (See Comments)    Unknown reaction Listed on MAR    Allergies as of 03/16/2024  Reactions   Alphagan P [brimonidine Tartrate] Other (See Comments)   Unknown reaction Listed on MAR   Azopt [brinzolamide] Other (See Comments)   Unknown reaction Listed on MAR   Lumigan [bimatoprost] Other (See Comments)   Unknown reaction Listed on [facility]        Medication List        Accurate as of March 16, 2024  4:00 PM. If you have any questions, ask your nurse or doctor.          STOP taking these medications    B COMPLEX VITAMINS (W/ FA) PO Stopped by: Tymira Horkey X Alinda Egolf   CALAMINE EX Stopped by: Theron Cumbie X Drezden Seitzinger   Calcium  Carbonate-Vitamin D 600-400 MG-UNIT tablet Stopped by: Gio Janoski X Srihaan Mastrangelo   PreviDent 5000 Booster Plus 1.1 % Pste Generic drug: Sodium Fluoride Stopped by: Gertie Broerman X Elnora Quizon       TAKE these medications    acetaminophen  325 MG tablet Commonly known as: TYLENOL  Take 650 mg by mouth every 4 (four) hours as needed for fever or moderate pain (pain score 4-6).   apixaban  2.5 MG Tabs tablet Commonly known as: ELIQUIS  Take  1 tablet (2.5 mg total) by mouth 2 (two) times daily.   lactose free nutrition Liqd Take 237 mLs by mouth 2 (two) times daily between meals.   melatonin 3 MG Tabs tablet Take 3 mg by mouth at bedtime.   metoprolol  tartrate Bianca MG tablet Commonly known as: LOPRESSOR  Take 12.5 mg by mouth as needed.  for SVT HOLD IF HR < 50   metoprolol  tartrate Bianca MG tablet Commonly known as: LOPRESSOR  Take 12.5 mg by mouth 2 (two) times daily. for SVT HOLD IF BP <90/60, HR<50   omeprazole  20 MG capsule Commonly known as: PRILOSEC Take 20 mg by mouth daily.   Therems Tabs Take 1 tablet by mouth daily in the afternoon.   timolol  0.5 % ophthalmic solution Commonly known as: TIMOPTIC  Place 1 drop into both eyes daily.        Review of Systems  Unable to perform ROS: Dementia    Immunization History  Administered Date(s) Administered  . DTaP 09/05/2004  . Fluad Quad(high Dose 65+) 02/09/2023  . INFLUENZA, HIGH DOSE SEASONAL PF 01/06/2014, 11/08/2018  . Influenza, Quadrivalent, Recombinant, Inj, Pf 01/09/2018, 01/21/2019  . Influenza-Unspecified 11/07/2013, 01/14/2020, 01/26/2021, 10/Bianca/2023, 01/14/2024  . Moderna Covid-19 Vaccine  Bivalent Booster 4yrs & up 04/04/2021  . Moderna SARS-COV2 Booster Vaccination 02/08/2022  . Moderna Sars-Covid-2 Vaccination 05/09/2019, 06/06/2019, 02/22/2020, 09/06/2020  . PFIZER(Purple Top)SARS-COV-2 Vaccination 01/23/2023  . Pneumococcal Conjugate-13 01/06/2014  . Pneumococcal Polysaccharide-23 09/05/2004, 11/19/2021  . Pneumococcal-Unspecified 09/05/2004, 10/18/2014  . Tdap 09/05/2004, 07/19/2022  . Zoster Recombinant(Shingrix) 03/21/2021, 05/24/2021   Pertinent  Health Maintenance Due  Topic Date Due  . Influenza Vaccine  Completed  . Bone Density Scan  Completed      08/26/2021    8:30 PM 08/27/2021   10:00 AM 01/09/2022   12:36 PM 06/25/2022   12:28 PM 07/19/2022   10:41 AM  Fall Risk  Falls in the past year?   0 0 0  Was there an injury with  Fall?   0  0  0   Fall Risk Category Calculator   0 0 0  Fall Risk Category (Retired)   Low     (RETIRED) Patient Fall Risk Level High fall risk  High fall risk  Low fall risk     Patient at Risk for Falls Due to   No  Fall Risks No Fall Risks No Fall Risks  Fall risk Follow up   Falls evaluation completed  Falls evaluation completed Falls evaluation completed     Data saved with a previous flowsheet row definition   Functional Status Survey:    Vitals:   12/08/Bianca 1238  BP: (!) 148/64  Pulse: 76  Resp: 18  Temp: 97.6 F (36.4 C)  SpO2: 96%  Weight: 99 lb 6.4 oz (45.1 kg)   Body mass index is 20.77 kg/m. Physical Exam Vitals and nursing note reviewed.  Constitutional:      Appearance: Normal appearance.  HENT:     Head: Normocephalic and atraumatic.     Nose: Nose normal.     Mouth/Throat:     Mouth: Mucous membranes are moist.  Eyes:     Extraocular Movements: Extraocular movements intact.     Conjunctiva/sclera: Conjunctivae normal.     Pupils: Pupils are equal, round, and reactive to light.  Cardiovascular:     Rate and Rhythm: Normal rate and regular rhythm.     Heart sounds: No murmur heard. Pulmonary:     Effort: Pulmonary effort is normal.     Breath sounds: No rales.  Abdominal:     General: Bowel sounds are normal.     Palpations: Abdomen is soft.     Tenderness: There is no abdominal tenderness.  Musculoskeletal:     Cervical back: Normal range of motion and neck supple.     Right lower leg: No edema.     Left lower leg: No edema.  Skin:    General: Skin is warm and dry.     Comments: Hammer toes most of her toes Yellow, thick toe nails.   Neurological:     General: No focal deficit present.     Mental Status: She is alert and oriented to person, place, and time. Mental status is at baseline.     Gait: Gait abnormal.     Comments: Ambulates with walker.   Psychiatric:        Mood and Affect: Mood normal.        Behavior: Behavior normal.      Comments: At baseline confusion     Labs reviewed: Recent Labs    10/23/Bianca 0000  NA 140  K 4.4  CL 105  CO2 23*  BUN 24*  CREATININE 1.0  CALCIUM  8.2*   No results for input(s): AST, ALT, ALKPHOS, BILITOT, PROT, ALBUMIN in the last 8760 hours. Recent Labs    10/24/Bianca 0000  WBC 9.2  HGB 12.2  HCT 39  PLT 338   Lab Results  Component Value Date   TSH 3.96 06/12/2022   Lab Results  Component Value Date   HGBA1C 5.9 (H) 11/04/2019   Lab Results  Component Value Date   CHOL 110 09/19/2021   HDL 55 09/19/2021   LDLCALC 41 09/19/2021   TRIG 56 09/19/2021   CHOLHDL 2.8 11/06/2019    Significant Diagnostic Results in last 30 days:  No results found.  Assessment/Plan  Paroxysmal atrial flutter (HCC) off Amiodarone  01/2020 in hospital due to prolonged QT interval. On Metoprolol  bid and prn, Eliquis  Tachycardia and bradycardia both  GERD (gastroesophageal reflux disease) no further c/o abd discomfort or pain, on PPI, Hgb 14.9 06/12/22  Major neurocognitive disorder (HCC) Senile dementia since CVA, no behavioral issues. MMSE 1/30 4/29/Bianca  History of stroke  11/02/19 CVA (MRI cevebral small acute infarcts in the posterior left hemisphere in the left PCA and  MCA watershed area. Takes Eliquis .  Osteoarthritis, multiple sites Ambulates with walker with a standby assist   Family/ staff Communication: Plan of care reviewed with the patient and charge nurse  Labs/tests ordered: None

## 2024-03-16 NOTE — Assessment & Plan Note (Signed)
 no further c/o abd discomfort or pain, on PPI, Hgb 14.9 06/12/22

## 2024-03-16 NOTE — Assessment & Plan Note (Signed)
 Senile dementia since CVA, no behavioral issues. MMSE 1/30 08/06/23

## 2024-03-16 NOTE — Assessment & Plan Note (Addendum)
 Ambulates with walker with a standby assist

## 2024-03-17 ENCOUNTER — Other Ambulatory Visit: Payer: Self-pay | Admitting: Nurse Practitioner

## 2024-03-23 ENCOUNTER — Non-Acute Institutional Stay (SKILLED_NURSING_FACILITY): Payer: Self-pay | Admitting: Nurse Practitioner

## 2024-03-23 ENCOUNTER — Encounter: Payer: Self-pay | Admitting: Nurse Practitioner

## 2024-03-23 DIAGNOSIS — F0390 Unspecified dementia without behavioral disturbance: Secondary | ICD-10-CM

## 2024-03-23 DIAGNOSIS — I471 Supraventricular tachycardia, unspecified: Secondary | ICD-10-CM

## 2024-03-23 DIAGNOSIS — I639 Cerebral infarction, unspecified: Secondary | ICD-10-CM

## 2024-03-23 DIAGNOSIS — M150 Primary generalized (osteo)arthritis: Secondary | ICD-10-CM

## 2024-03-23 NOTE — Progress Notes (Signed)
 Location:   [facility] Room Number: 40A Place of Service:  SNF (31) Provider: [facility] Bryndan Bilyk NP  Cleotilde Garnette HERO, MD  Patient Care Team: Cleotilde Garnette HERO, MD as PCP - General (Family Medicine)  Extended Emergency Contact Information Primary Emergency Contact: [name],[name] Mobile Phone: [phone] Relation: Nephew Secondary Emergency Contact: [name],[name] Mobile Phone: [phone] Relation: Friend  Code Status: DNR Goals of care: Advanced Directive information    01/30/2024   10:07 AM  Advanced Directives  Type of Advance Directive Healthcare Power of [place];Out of facility DNR (pink MOST or yellow form)  Does patient want to make changes to medical advance directive? No - Patient declined  Copy of Healthcare Power of Attorney in Chart? Yes - validated most recent copy scanned in chart (See row information)  Pre-existing out of facility DNR order (yellow form or pink MOST form) Pink MOST form placed in chart (order not valid for inpatient use);Yellow form placed in chart (order not valid for inpatient use)     Chief Complaint  Patient presents with   Acute Visit    Left hip pain and a bruise    HPI:  Pt is a 89 y.o. female seen today for an acute visit for left hip/bruise, uncertain of etiology, able to walk with walker.  Pending Left hip/coccyx x-ray   Adult failure to thrive, MOST: Comfort measures, no hospitalization, no IVF/ABT             Hx of emphysema, stable.  Edema, not apparent, off Torsemide. BNP 798 08/26/21, echo 08/27/21 EF 60-65%, weight stable.              PSVT off Amiodarone  01/2020 in hospital due to prolonged QT interval. Off Metoprolol  bid and prn-not used, Eliquis              GERD, no further c/o abd discomfort or pain, on PPI, Hgb 14.9 06/12/22             Depression, stable, off Sertraline .              CKD Bun/creat 22/1.0 06/12/22             Senile dementia since CVA, no behavioral issues. MMSE 1/30 08/06/23             11/02/19 CVA  (MRI cevebral small acute infarcts in the posterior left hemisphere in the left PCA and MCA watershed area. Takes Eliquis .              Anemia, 01/21/20 Iron 44, ferritin 63, Vit B12 695, Hgb 14.9 06/12/22             Hyperlipidemia, off Atorvastatin , LDL 41 09/19/21             OA lower back, aches, ambulates with walker.    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 No pertinent surgical history.  Allergies[1]  Allergies as of 03/23/2024       Reactions   Alphagan P [brimonidine Tartrate] Other (See Comments)   Unknown reaction Listed on MAR   Azopt [brinzolamide] Other (See Comments)   Unknown reaction Listed on MAR   Lumigan [  bimatoprost] Other (See Comments)   Unknown reaction Listed on [facility]        Medication List        Accurate as of March 23, 2024  3:59 PM. If you have any questions, ask your nurse or doctor.          acetaminophen  325 MG tablet Commonly known as: TYLENOL  Take 650 mg by mouth every 4 (four) hours as needed for fever or moderate pain (pain score 4-6).   apixaban  2.5 MG Tabs tablet Commonly known as: ELIQUIS  Take 1 tablet (2.5 mg total) by mouth 2 (two) times daily.   lactose free nutrition Liqd Take 237 mLs by mouth 2 (two) times daily between meals.   melatonin 3 MG Tabs tablet Take 3 mg by mouth at bedtime.   metoprolol  tartrate 25 MG tablet Commonly known as: LOPRESSOR  Take 12.5 mg by mouth 2 (two) times daily. for SVT HOLD IF BP <90/60, HR<50   omeprazole  20 MG capsule Commonly known as: PRILOSEC Take 20 mg by mouth daily.   Therems Tabs Take 1 tablet by mouth daily in the afternoon.   timolol  0.5 % ophthalmic  solution Commonly known as: TIMOPTIC  Place 1 drop into both eyes daily.        Review of Systems  Unable to perform ROS: Dementia    Immunization History  Administered Date(s) Administered   DTaP 09/05/2004   Fluad Quad(high Dose 65+) 02/09/2023   INFLUENZA, HIGH DOSE SEASONAL PF 01/06/2014, 11/08/2018   Influenza, Quadrivalent, Recombinant, Inj, Pf 01/09/2018, 01/21/2019   Influenza-Unspecified 11/07/2013, 01/14/2020, 01/26/2021, 01/31/2022, 01/14/2024   Moderna Covid-19 Vaccine  Bivalent Booster 83yrs & up 04/04/2021   Moderna SARS-COV2 Booster Vaccination 02/08/2022   Moderna Sars-Covid-2 Vaccination 05/09/2019, 06/06/2019, 02/22/2020, 09/06/2020   PFIZER(Purple Top)SARS-COV-2 Vaccination 01/23/2023   Pneumococcal Conjugate-13 01/06/2014   Pneumococcal Polysaccharide-23 09/05/2004, 11/19/2021   Pneumococcal-Unspecified 09/05/2004, 10/18/2014   Tdap 09/05/2004, 07/19/2022   Zoster Recombinant(Shingrix) 03/21/2021, 05/24/2021   Pertinent  Health Maintenance Due  Topic Date Due   Influenza Vaccine  Completed   Bone Density Scan  Completed      08/26/2021    8:30 PM 08/27/2021   10:00 AM 01/09/2022   12:36 PM 06/25/2022   12:28 PM 07/19/2022   10:41 AM  Fall Risk  Falls in the past year?   0 0 0  Was there an injury with Fall?   0  0  0   Fall Risk Category Calculator   0 0 0  Fall Risk Category (Retired)   Low     (RETIRED) Patient Fall Risk Level High fall risk  High fall risk  Low fall risk     Patient at Risk for Falls Due to   No Fall Risks No Fall Risks No Fall Risks  Fall risk Follow up   Falls evaluation completed  Falls evaluation completed Falls evaluation completed     Data saved with a previous flowsheet row definition   Functional Status Survey:    Vitals:   03/23/24 1531  BP: 105/67  Pulse: 80  Resp: 18  Temp: 97.6 F (36.4 C)  SpO2: 96%  Weight: 99 lb 6.4 oz (45.1 kg)   Body mass index is 20.77 kg/m. Physical Exam Vitals and nursing note  reviewed.  Constitutional:      Appearance: Normal appearance.  HENT:     Head: Normocephalic and atraumatic.     Nose: Nose normal.     Mouth/Throat:     Mouth: Mucous membranes  are moist.  Eyes:     Extraocular Movements: Extraocular movements intact.     Conjunctiva/sclera: Conjunctivae normal.     Pupils: Pupils are equal, round, and reactive to light.  Cardiovascular:     Rate and Rhythm: Normal rate and regular rhythm.     Heart sounds: No murmur heard. Pulmonary:     Effort: Pulmonary effort is normal.     Breath sounds: No rales.  Abdominal:     General: Bowel sounds are normal.     Palpations: Abdomen is soft.     Tenderness: There is no abdominal tenderness.  Musculoskeletal:        General: Tenderness present.     Cervical back: Normal range of motion and neck supple.     Right lower leg: No edema.     Left lower leg: No edema.     Comments: Left buttock/hip pain complained, palpated Able to ambulate with walker.   Skin:    General: Skin is warm and dry.     Findings: Bruising present.     Comments: Hammer toes most of her toes Yellow, thick toe nails.  Left buttock bruise  Neurological:     General: No focal deficit present.     Mental Status: She is alert and oriented to person, place, and time. Mental status is at baseline.     Gait: Gait abnormal.     Comments: Ambulates with walker.   Psychiatric:        Mood and Affect: Mood normal.        Behavior: Behavior normal.     Comments: At baseline confusion     Labs reviewed: Recent Labs    01/30/24 0000  NA 140  K 4.4  CL 105  CO2 23*  BUN 24*  CREATININE 1.0  CALCIUM  8.2*   No results for input(s): AST, ALT, ALKPHOS, BILITOT, PROT, ALBUMIN in the last 8760 hours. Recent Labs    01/31/24 0000  WBC 9.2  HGB 12.2  HCT 39  PLT 338   Lab Results  Component Value Date   TSH 3.96 06/12/2022   Lab Results  Component Value Date   HGBA1C 5.9 (H) 11/04/2019   Lab Results   Component Value Date   CHOL 110 09/19/2021   HDL 55 09/19/2021   LDLCALC 41 09/19/2021   TRIG 56 09/19/2021   CHOLHDL 2.8 11/06/2019    Significant Diagnostic Results in last 30 days:  No results found.  Assessment/Plan: Osteoarthritis, multiple sites left hip/bruise, uncertain of etiology, able to walk with walker. Pending Left hip/coccyx x-ray  OA lower back, aches, ambulates with walker.   Stroke [facility]) 11/02/19 CVA (MRI cevebral small acute infarcts in the posterior left hemisphere in the left PCA and MCA watershed area. Takes Eliquis .   Major neurocognitive disorder (HCC)  Senile dementia since CVA, no behavioral issues. MMSE 1/30 08/06/23  Adult failure to thrive, MOST: Comfort measures, no hospitalization, no IVF/ABT    Family/ staff Communication: Plan of care reviewed with the patient and charge nurse  Labs/tests ordered: Pending x-ray of left hip/coccyx     [1]  Allergies Allergen Reactions   Alphagan P [Brimonidine Tartrate] Other (See Comments)    Unknown reaction Listed on MAR   Azopt [Brinzolamide] Other (See Comments)    Unknown reaction Listed on MAR   Lumigan [Bimatoprost] Other (See Comments)    Unknown reaction Listed on [facility]

## 2024-03-23 NOTE — Assessment & Plan Note (Signed)
 PSVT off Amiodarone  01/2020 in hospital due to prolonged QT interval. Off Metoprolol  bid and prn-not used, Eliquis 

## 2024-03-23 NOTE — Assessment & Plan Note (Signed)
11/02/19 CVA (MRI cevebral small acute infarcts in the posterior left hemisphere in the left PCA and MCA watershed area. Takes Eliquis.

## 2024-03-23 NOTE — Assessment & Plan Note (Signed)
 left hip/bruise, uncertain of etiology, able to walk with walker. Pending Left hip/coccyx x-ray  OA lower back, aches, ambulates with walker.

## 2024-03-23 NOTE — Assessment & Plan Note (Signed)
 Senile dementia since CVA, no behavioral issues. MMSE 1/30 08/06/23 Adult failure to thrive, MOST: Comfort measures, no hospitalization, no IVF/ABT

## 2024-03-29 ENCOUNTER — Telehealth: Payer: Self-pay | Admitting: Adult Health

## 2024-03-29 NOTE — Telephone Encounter (Signed)
 Charge nurse called on-call provider regarding patient who tested for Influenza A, ordered Tamiflu 75 mg PO X 1 on first day the 30 mg BID X 4 days.

## 2024-03-30 ENCOUNTER — Encounter: Payer: Self-pay | Admitting: Nurse Practitioner

## 2024-03-30 ENCOUNTER — Non-Acute Institutional Stay (SKILLED_NURSING_FACILITY): Payer: Self-pay | Admitting: Nurse Practitioner

## 2024-03-30 DIAGNOSIS — J101 Influenza due to other identified influenza virus with other respiratory manifestations: Secondary | ICD-10-CM

## 2024-03-30 DIAGNOSIS — I471 Supraventricular tachycardia, unspecified: Secondary | ICD-10-CM

## 2024-03-30 DIAGNOSIS — I639 Cerebral infarction, unspecified: Secondary | ICD-10-CM

## 2024-03-30 DIAGNOSIS — J439 Emphysema, unspecified: Secondary | ICD-10-CM | POA: Diagnosis not present

## 2024-03-30 DIAGNOSIS — F0390 Unspecified dementia without behavioral disturbance: Secondary | ICD-10-CM

## 2024-03-30 DIAGNOSIS — N1831 Chronic kidney disease, stage 3a: Secondary | ICD-10-CM

## 2024-03-30 DIAGNOSIS — K219 Gastro-esophageal reflux disease without esophagitis: Secondary | ICD-10-CM | POA: Diagnosis not present

## 2024-03-30 NOTE — Assessment & Plan Note (Signed)
"   no further c/o abd discomfort or pain, on PPI, Hgb 12.2 01/31/24 "

## 2024-03-30 NOTE — Assessment & Plan Note (Signed)
11/02/19 CVA (MRI cevebral small acute infarcts in the posterior left hemisphere in the left PCA and MCA watershed area. Takes Eliquis.

## 2024-03-30 NOTE — Assessment & Plan Note (Signed)
"   OA lower back, aches, ambulates with walker. Risk of falling.  "

## 2024-03-30 NOTE — Assessment & Plan Note (Signed)
 Senile dementia since CVA, no behavioral issues. MMSE 1/30 08/06/23

## 2024-03-30 NOTE — Progress Notes (Unsigned)
 " Location:   [facility] Room Number: 40A Place of Service:  SNF (31) Provider: [facility] Fryda Molenda NP  Cleotilde Garnette HERO, MD  Patient Care Team: Cleotilde Garnette HERO, MD as PCP - General (Family Medicine)  Extended Emergency Contact Information Primary Emergency Contact: [name],[name] Mobile Phone: [phone] Relation: Nephew Secondary Emergency Contact: [name],[name] Mobile Phone: [phone] Relation: Friend  Code Status: DNR Goals of care: Advanced Directive information    01/30/2024   10:07 AM  Advanced Directives  Type of Advance Directive Healthcare Power of [place];Out of facility DNR (pink MOST or yellow form)  Does patient want to make changes to medical advance directive? No - Patient declined  Copy of Healthcare Power of Attorney in Chart? Yes - validated most recent copy scanned in chart (See row information)  Pre-existing out of facility DNR order (yellow form or pink MOST form) Pink MOST form placed in chart (order not valid for inpatient use);Yellow form placed in chart (order not valid for inpatient use)     Chief Complaint  Patient presents with   Acute Visit    Flu A    HPI:  Pt is a 89 y.o. female seen today for an acute visit for cough, nasal congestion, tested positive for flu A 03/29/2024, Tamiflu started subsequently.  She is afebrile, noted generalized weakness and decreased appetite, had a fall today when the patient was noted sitting on on the floor against her bed, no apparent injury.  Adult failure to thrive, MOST: Comfort measures, no hospitalization, no IVF/ABT             Hx of emphysema, stable.  Edema, not apparent, off Torsemide. BNP 798 08/26/21, echo 08/27/21 EF 60-65%, weight stable.              PSVT off Amiodarone  01/2020 in hospital due to prolonged QT interval. Off Metoprolol  bid and prn-not used, Eliquis              GERD, no further c/o abd discomfort or pain, on PPI, Hgb 12.2 01/31/24             Depression, stable, off  Sertraline .              CKD Bun/creat 24/1.0 01/30/24             Senile dementia since CVA, no behavioral issues. MMSE 1/30 08/06/23             11/02/19 CVA (MRI cevebral small acute infarcts in the posterior left hemisphere in the left PCA and MCA watershed area. Takes Eliquis .              Anemia, 12.2 01/31/24             Hyperlipidemia, off Atorvastatin , LDL 41 09/19/21             OA lower back, aches, ambulates with walker. Risk of falling.        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 No pertinent surgical history.  Allergies[1]  Allergies as of 03/30/2024       Reactions   Alphagan P [brimonidine Tartrate] Other (See Comments)   Unknown reaction  Listed on MAR   Azopt [brinzolamide] Other (See Comments)   Unknown reaction Listed on MAR   Lumigan [bimatoprost] Other (See Comments)   Unknown reaction Listed on [facility]        Medication List        Accurate as of March 30, 2024 11:59 PM. If you have any questions, ask your nurse or doctor.          acetaminophen  325 MG tablet Commonly known as: TYLENOL  Take 650 mg by mouth every 4 (four) hours as needed for fever or moderate pain (pain score 4-6).   apixaban  2.5 MG Tabs tablet Commonly known as: ELIQUIS  Take 1 tablet (2.5 mg total) by mouth 2 (two) times daily.   lactose free nutrition Liqd Take 237 mLs by mouth 2 (two) times daily between meals.   melatonin 3 MG Tabs tablet Take 3 mg by mouth at bedtime.   metoprolol  tartrate 25 MG tablet Commonly known as: LOPRESSOR  Take 12.5 mg by mouth 2 (two) times daily. for SVT HOLD IF BP <90/60, HR<50   omeprazole  20 MG capsule Commonly known as:  PRILOSEC Take 20 mg by mouth daily.   Therems Tabs Take 1 tablet by mouth daily in the afternoon.   timolol  0.5 % ophthalmic solution Commonly known as: TIMOPTIC  Place 1 drop into both eyes daily.        Review of Systems  Unable to perform ROS: Dementia    Immunization History  Administered Date(s) Administered   DTaP 09/05/2004   Fluad Quad(high Dose 65+) 02/09/2023   INFLUENZA, HIGH DOSE SEASONAL PF 01/06/2014, 11/08/2018   Influenza, Quadrivalent, Recombinant, Inj, Pf 01/09/2018, 01/21/2019   Influenza-Unspecified 11/07/2013, 01/14/2020, 01/26/2021, 01/31/2022, 01/14/2024   Moderna Covid-19 Vaccine  Bivalent Booster 37yrs & up 04/04/2021   Moderna SARS-COV2 Booster Vaccination 02/08/2022   Moderna Sars-Covid-2 Vaccination 05/09/2019, 06/06/2019, 02/22/2020, 09/06/2020   PFIZER(Purple Top)SARS-COV-2 Vaccination 01/23/2023   Pneumococcal Conjugate-13 01/06/2014   Pneumococcal Polysaccharide-23 09/05/2004, 11/19/2021   Pneumococcal-Unspecified 09/05/2004, 10/18/2014   Tdap 09/05/2004, 07/19/2022   Zoster Recombinant(Shingrix) 03/21/2021, 05/24/2021   Pertinent  Health Maintenance Due  Topic Date Due   Influenza Vaccine  Completed   Bone Density Scan  Completed      08/26/2021    8:30 PM 08/27/2021   10:00 AM 01/09/2022   12:36 PM 06/25/2022   12:28 PM 07/19/2022   10:41 AM  Fall Risk  Falls in the past year?   0 0 0  Was there an injury with Fall?   0  0  0   Fall Risk Category Calculator   0 0 0  Fall Risk Category (Retired)   Low     (RETIRED) Patient Fall Risk Level High fall risk  High fall risk  Low fall risk     Patient at Risk for Falls Due to   No Fall Risks No Fall Risks No Fall Risks  Fall risk Follow up   Falls evaluation completed  Falls evaluation completed Falls evaluation completed     Data saved with a previous flowsheet row definition   Functional Status Survey:    Vitals:   03/30/24 1312  BP: 105/60  Pulse: 70  Resp: 17  Temp: (!) 97.5 F  (36.4 C)  SpO2: 96%  Weight: 99 lb 6.4 oz (45.1 kg)   Body mass index is 20.77 kg/m. Physical Exam Vitals and nursing note reviewed.  Constitutional:      Comments: fatigue  HENT:     Head: Normocephalic and atraumatic.  Nose: Congestion present.     Mouth/Throat:     Mouth: Mucous membranes are moist.  Eyes:     Extraocular Movements: Extraocular movements intact.     Conjunctiva/sclera: Conjunctivae normal.     Pupils: Pupils are equal, round, and reactive to light.  Cardiovascular:     Rate and Rhythm: Normal rate and regular rhythm.     Heart sounds: No murmur heard. Pulmonary:     Effort: Pulmonary effort is normal.     Breath sounds: No rales.     Comments: cough Abdominal:     General: Bowel sounds are normal.     Palpations: Abdomen is soft.     Tenderness: There is no abdominal tenderness.  Musculoskeletal:        General: Tenderness present.     Cervical back: Normal range of motion and neck supple.     Right lower leg: No edema.     Left lower leg: No edema.     Comments: Left buttock/hip pain complained, palpated Able to ambulate with walker.   Skin:    General: Skin is warm and dry.     Findings: Bruising present.     Comments: Hammer toes most of her toes Yellow, thick toe nails.  Left buttock bruise  Neurological:     General: No focal deficit present.     Mental Status: She is alert and oriented to person, place, and time. Mental status is at baseline.     Gait: Gait abnormal.     Comments: Ambulates with walker.   Psychiatric:        Mood and Affect: Mood normal.        Behavior: Behavior normal.     Comments: At baseline confusion     Labs reviewed: Recent Labs    01/30/24 0000  NA 140  K 4.4  CL 105  CO2 23*  BUN 24*  CREATININE 1.0  CALCIUM  8.2*   No results for input(s): AST, ALT, ALKPHOS, BILITOT, PROT, ALBUMIN in the last 8760 hours. Recent Labs    01/31/24 0000  WBC 9.2  HGB 12.2  HCT 39  PLT 338   Lab  Results  Component Value Date   TSH 3.96 06/12/2022   Lab Results  Component Value Date   HGBA1C 5.9 (H) 11/04/2019   Lab Results  Component Value Date   CHOL 110 09/19/2021   HDL 55 09/19/2021   LDLCALC 41 09/19/2021   TRIG 56 09/19/2021   CHOLHDL 2.8 11/06/2019    Significant Diagnostic Results in last 30 days:  No results found.  Assessment/Plan: Influenza A   cough, nasal congestion, tested positive for flu A 03/29/2024, Tamiflu started subsequently.  She is afebrile, noted generalized weakness and decreased appetite, had a fall today when the patient was noted sitting on on the floor against her bed, no apparent injury.  Adult failure to thrive, MOST: Comfort measures, no hospitalization, no IVF/ABT  Emphysema of lung (HCC) Hx of emphysema, stable.   Paroxysmal SVT (supraventricular tachycardia) PSVT off Amiodarone  01/2020 in hospital due to prolonged QT interval. Off Metoprolol  bid and prn-not used, Eliquis   GERD (gastroesophageal reflux disease)  no further c/o abd discomfort or pain, on PPI, Hgb 12.2 01/31/24  CKD (chronic kidney disease) stage 3, GFR 30-59 ml/min (HCC) Bun/creat 24/1.0 01/30/24  Major neurocognitive disorder (HCC) Senile dementia since CVA, no behavioral issues. MMSE 1/30 08/06/23  Stroke (HCC)  11/02/19 CVA (MRI cevebral small acute infarcts in the posterior left hemisphere  in the left PCA and MCA watershed area. Takes Eliquis .   Osteoarthritis, multiple sites  OA lower back, aches, ambulates with walker. Risk of falling.     Family/ staff Communication: Plan of care reviewed with the patient and charge nurse  Labs/tests ordered: None     [1]  Allergies Allergen Reactions   Alphagan P [Brimonidine Tartrate] Other (See Comments)    Unknown reaction Listed on MAR   Azopt [Brinzolamide] Other (See Comments)    Unknown reaction Listed on MAR   Lumigan [Bimatoprost] Other (See Comments)    Unknown reaction Listed on MAR   "

## 2024-03-30 NOTE — Assessment & Plan Note (Signed)
Hx of emphysema, stable.

## 2024-03-30 NOTE — Assessment & Plan Note (Signed)
 Bun/creat 24/1.0 01/30/24

## 2024-03-30 NOTE — Assessment & Plan Note (Signed)
 PSVT off Amiodarone  01/2020 in hospital due to prolonged QT interval. Off Metoprolol  bid and prn-not used, Eliquis 

## 2024-03-30 NOTE — Assessment & Plan Note (Signed)
"    cough, nasal congestion, tested positive for flu A 03/29/2024, Tamiflu started subsequently.  She is afebrile, noted generalized weakness and decreased appetite, had a fall today when the patient was noted sitting on on the floor against her bed, no apparent injury.  Adult failure to thrive, MOST: Comfort measures, no hospitalization, no IVF/ABT "

## 2024-03-31 ENCOUNTER — Non-Acute Institutional Stay (SKILLED_NURSING_FACILITY): Payer: Self-pay | Admitting: Nurse Practitioner

## 2024-03-31 ENCOUNTER — Encounter: Payer: Self-pay | Admitting: Nurse Practitioner

## 2024-03-31 ENCOUNTER — Encounter (HOSPITAL_COMMUNITY): Payer: Self-pay | Admitting: Emergency Medicine

## 2024-03-31 ENCOUNTER — Other Ambulatory Visit: Payer: Self-pay

## 2024-03-31 ENCOUNTER — Emergency Department (HOSPITAL_COMMUNITY)
Admission: EM | Admit: 2024-03-31 | Discharge: 2024-04-09 | Disposition: E | Attending: Emergency Medicine | Admitting: Emergency Medicine

## 2024-03-31 DIAGNOSIS — Z515 Encounter for palliative care: Secondary | ICD-10-CM | POA: Diagnosis not present

## 2024-03-31 DIAGNOSIS — Z79899 Other long term (current) drug therapy: Secondary | ICD-10-CM | POA: Diagnosis not present

## 2024-03-31 DIAGNOSIS — Y92129 Unspecified place in nursing home as the place of occurrence of the external cause: Secondary | ICD-10-CM | POA: Insufficient documentation

## 2024-03-31 DIAGNOSIS — W19XXXA Unspecified fall, initial encounter: Secondary | ICD-10-CM

## 2024-03-31 DIAGNOSIS — F0390 Unspecified dementia without behavioral disturbance: Secondary | ICD-10-CM | POA: Diagnosis not present

## 2024-03-31 DIAGNOSIS — I471 Supraventricular tachycardia, unspecified: Secondary | ICD-10-CM | POA: Diagnosis not present

## 2024-03-31 DIAGNOSIS — R0603 Acute respiratory distress: Secondary | ICD-10-CM | POA: Diagnosis present

## 2024-03-31 DIAGNOSIS — M79605 Pain in left leg: Secondary | ICD-10-CM | POA: Diagnosis not present

## 2024-03-31 DIAGNOSIS — R627 Adult failure to thrive: Secondary | ICD-10-CM | POA: Diagnosis not present

## 2024-03-31 DIAGNOSIS — J101 Influenza due to other identified influenza virus with other respiratory manifestations: Secondary | ICD-10-CM | POA: Diagnosis not present

## 2024-03-31 DIAGNOSIS — S72002A Fracture of unspecified part of neck of left femur, initial encounter for closed fracture: Secondary | ICD-10-CM | POA: Insufficient documentation

## 2024-03-31 DIAGNOSIS — Z7901 Long term (current) use of anticoagulants: Secondary | ICD-10-CM | POA: Insufficient documentation

## 2024-03-31 DIAGNOSIS — J9601 Acute respiratory failure with hypoxia: Secondary | ICD-10-CM | POA: Insufficient documentation

## 2024-03-31 MED ORDER — LORAZEPAM 1 MG PO TABS: 0.5000 mg | ORAL_TABLET | ORAL | 0 refills | Status: DC | PRN

## 2024-03-31 MED ORDER — MORPHINE SULFATE (PF) 4 MG/ML IV SOLN
4.0000 mg | Freq: Once | INTRAVENOUS | Status: AC
  Administered 2024-03-31: 4 mg via INTRAVENOUS
  Filled 2024-03-31: qty 1

## 2024-03-31 MED ORDER — ONDANSETRON HCL 4 MG/2ML IJ SOLN
4.0000 mg | Freq: Once | INTRAMUSCULAR | Status: AC
  Administered 2024-03-31: 4 mg via INTRAVENOUS
  Filled 2024-03-31: qty 2

## 2024-03-31 MED ORDER — MORPHINE SULFATE (CONCENTRATE) 20 MG/ML PO SOLN: 5.0000 mg | ORAL | 0 refills | Status: DC | PRN

## 2024-03-31 NOTE — Assessment & Plan Note (Signed)
 Progressing rapidly since on set of Flu A Continue supportive care, under Hospice service.

## 2024-03-31 NOTE — ED Triage Notes (Signed)
 Patient BIB EMS for evaluation of respiratory distress.   Pt recently dx with Flu and L femoral head fx s/p fall.  Currently on Hospice and was getting oral Morphine .  Tonight began to have difficulty breathing and became more unresponsive.  Facility was unable to administer oral pain medication and family requested patient be sent to ED for comfort care/pain management/IV fluids.  GCS of 3 on arrival.  Pt is DNR/DNI

## 2024-03-31 NOTE — ED Provider Notes (Signed)
 " [place] EMERGENCY DEPARTMENT AT [facility] Provider Note   CSN: [id number] Arrival date & time: 03/31/24  8062     Patient presents with: Respiratory Distress   Bianca [name] is a 89 y.o. female.   Pt with recent diagnosis influenza, hospice care/comfort care patient, recent fall at SNF this AM with reported hip fracture, sent to ED for ?pain control.  EMS notes that patient is obtunded, in resp distress, and does not appear in acute pain. Pt unable to provide additional history - level 5 caveat.   The history is provided by the patient, the EMS personnel, medical records and a relative. The history is limited by the condition of the patient.       Prior to Admission medications  Medication Sig Start Date End Date Taking? Authorizing Provider  acetaminophen  (TYLENOL ) 325 MG tablet Take 650 mg by mouth every 4 (four) hours as needed for fever (or pain).   Yes [provider]  apixaban  (ELIQUIS ) 2.5 MG TABS tablet Take 1 tablet (2.5 mg total) by mouth 2 (two) times daily. 11/10/19  Yes Cherlyn Labella, MD  LORazepam  (ATIVAN ) 1 MG tablet Take 0.5 tablets (0.5 mg total) by mouth every 2 (two) hours as needed for anxiety. Patient taking differently: Take 0.5 mg by mouth every 2 (two) hours as needed for anxiety (through 04/14/2024). 03/31/24 03/31/25 Yes [name], [name], NP  melatonin 3 MG TABS tablet Take 3 mg by mouth at bedtime.   Yes [provider]  metoprolol  tartrate (LOPRESSOR ) 25 MG tablet Take 12.5 mg by mouth See admin instructions. Take 12.5 mg by mouth two times a day for SVT and hold for B/P less than 90/60 OR a heart rate less than 50   Yes [provider]  morphine  (ROXANOL) 20 MG/ML concentrated solution Take 0.25 mLs (5 mg total) by mouth every 2 (two) hours as needed for up to 7 days for severe pain (pain score 7-10). Patient taking differently: Take 5 mg by mouth every 2 (two) hours as needed (for pain- through 04/14/2024). 03/31/24  04/07/24 Yes [name], [name], NP  Multiple Vitamin (THEREMS) TABS Take 1 tablet by mouth daily with lunch.   Yes [provider]  omeprazole  (PRILOSEC) 20 MG capsule Take 20 mg by mouth in the morning. 08/01/23  Yes [provider]  TAMIFLU 30 MG capsule Take 30 mg by mouth 2 (two) times daily. 03/30/24 04/03/24 Yes [provider]  timolol  (TIMOPTIC ) 0.5 % ophthalmic solution Place 1 drop into both eyes daily. 08/01/20  Yes [provider]    Allergies: Alphagan p [brimonidine tartrate], Azopt [brinzolamide], and Lumigan [bimatoprost]    Review of Systems  Unable to perform ROS: Patient unresponsive    Updated Vital Signs BP (!) 83/52   Pulse 82   Temp (!) 102.9 F (39.4 C) (Oral)   Resp (!) 23   SpO2 (!) 65%   Physical Exam Vitals and nursing note reviewed.  Constitutional:      General: She is in acute distress.     Appearance: She is well-developed. She is ill-appearing.  HENT:     Head: Atraumatic.     Nose: Nose normal.     Mouth/Throat:     Mouth: Mucous membranes are moist.  Eyes:     General: No scleral icterus.    Conjunctiva/sclera: Conjunctivae normal.     Pupils: Pupils are equal, round, and reactive to light.  Neck:     Trachea: No  tracheal deviation.  Cardiovascular:     Rate and Rhythm: Regular rhythm. Tachycardia present.     Pulses: Normal pulses.     Heart sounds: Normal heart sounds. No murmur heard.    No friction rub. No gallop.  Pulmonary:     Effort: Respiratory distress present.     Breath sounds: Rhonchi present.  Abdominal:     General: There is no distension.     Palpations: Abdomen is soft.     Tenderness: There is no abdominal tenderness.  Genitourinary:    Comments: No cva tenderness.  Musculoskeletal:     Cervical back: No rigidity or tenderness. No muscular tenderness.     Comments: LLE is shortened/rotated. Distal pulses palp.   Skin:    General: Skin is warm and dry.     Findings: No rash.   Neurological:     Comments: Obtunded. Minimal response to stimuli.      (all labs ordered are listed, but only abnormal results are displayed) Labs Reviewed - No data to display  EKG: EKG Interpretation Date/Time:  Tuesday March 31 2024 19:46:38 EST Ventricular Rate:  123 PR Interval:  47 QRS Duration:  104 QT Interval:  343 QTC Calculation: 471 R Axis:   136  Text Interpretation: Sinus tachycardia Artifact Baseline wander Confirmed by Bernard Drivers ([id number]) on 03/31/2024 10:32:14 PM  Radiology: No results found.   Procedures   Medications Ordered in the ED  morphine  (PF) 4 MG/ML injection 4 mg (4 mg Intravenous Given 03/31/24 2124)  ondansetron  (ZOFRAN ) injection 4 mg (4 mg Intravenous Given 03/31/24 2119)                                    Medical Decision Making Problems Addressed: Accidental fall, initial encounter: acute illness or injury with systemic symptoms that poses a threat to life or bodily functions Acute respiratory failure with hypoxia [facility]): acute illness or injury with systemic symptoms that poses a threat to life or bodily functions Closed left hip fracture, initial encounter [facility]): acute illness or injury Hospice care patient: chronic illness or injury Influenza A: acute illness or injury with systemic symptoms that poses a threat to life or bodily functions  Amount and/or Complexity of Data Reviewed Independent Historian: EMS    Details: Ems/family, hx External Data Reviewed: notes. ECG/medicine tests: ordered and independent interpretation performed. Decision-making details documented in ED Course. Discussion of management or test interpretation with external provider(s): Palliative care team, discussed pt.   Risk Prescription drug management. Parenteral controlled substances. Decision regarding hospitalization.  Reviewed nursing notes and prior charts for additional history.   Contacted family, JONELLE Portugal, discussed pt - confirms that  pt has MOST form and wishes are DNI/DNR, and comfort measures only, no surgeries or procedures, pain meds, oxygen, etc, ok.  - confirmed also w form from facility.   Morphine  iv. Zofran  iv. O2.   Pt does not appear in acute pain or acutely uncomfortably. Her o2 sats are persistently v low despite o2 therapy.   Some concern expressed whether current facility can manage patient in this condition, even from comfort measures standpoint.  Will consult palliative care team, ?whether residential hospice is option as pt appears very close to end of life.   Recheck, pt comfortable appearing. O2 sats remain low.   Signed out to oncoming EDP, Dr Griselda, that pt critically ill, hypoxic, but is comfort care only, dnr/dni, and  is awaiting possible transfer to [facility] pending palliative medicine team and/or TOC teaming making inquiry about bed availability there.   CRITICAL CARE RE: acute respiratory failure with hypoxia, influenza, hip fx, Performed by: Janiyha Montufar E Biran Mayberry Total critical care time: 40 minutes Critical care time was exclusive of separately billable procedures and treating other patients. Critical care was necessary to treat or prevent imminent or life-threatening deterioration. Critical care was time spent personally by me on the following activities: development of treatment plan with patient and/or surrogate as well as nursing, discussions with consultants, evaluation of patient's response to treatment, examination of patient, obtaining history from patient or surrogate, ordering and performing treatments and interventions, ordering and review of laboratory studies, ordering and review of radiographic studies, pulse oximetry and re-evaluation of patient's condition.      Final diagnoses:  None    ED Discharge Orders     None          Bernard Drivers, MD 03/31/24 2309  "

## 2024-03-31 NOTE — Assessment & Plan Note (Signed)
 off Amiodarone  01/2020 in hospital due to prolonged QT interval. Off Metoprolol  and Eliquis  HR 110bpm upon my examination today Lorazepam , Morphine  available to her for comfort measures.

## 2024-03-31 NOTE — Assessment & Plan Note (Signed)
 Flu A cough, nasal congestion, tested positive for flu A 03/29/2024, Tamiflu started subsequently.

## 2024-03-31 NOTE — Assessment & Plan Note (Signed)
"   MOST: Comfort measures, no hospitalization, no IVF/ABT "

## 2024-03-31 NOTE — Assessment & Plan Note (Signed)
 03/31/24 X-ray impacted left femoral neck fracture.  Comfort measures.

## 2024-03-31 NOTE — Assessment & Plan Note (Signed)
 Sustained from a mechanical fall this morning, noted pain and bruise left leg, HPOA declined ED eval.  Pending Xray hips, pelvis, left femur/knee Morphine  5mg  q2hr prn, Lorazepam  0.5mg  q2hs prn for comfort measures

## 2024-03-31 NOTE — ED Provider Notes (Signed)
 Care assumed at 2300. [facility] has flu and hip fracture.  Transferred to the emergency department due to uncontrolled respiratory symptoms at her current facility.  Patient care transferred pending placement at [facility] for ongoing comfort measures.   Griselda Norris, MD 04-11-24 [phone]

## 2024-03-31 NOTE — Assessment & Plan Note (Signed)
 Increased frailty and confusion are contributory Close supervision for safety.

## 2024-03-31 NOTE — Progress Notes (Addendum)
 " Location:   [facility] Room Number: 40A Place of Service:  SNF (31) Provider: [facility] Dreux Mcgroarty NP  Cleotilde Garnette HERO, MD  Patient Care Team: Cleotilde Garnette HERO, MD as PCP - General (Family Medicine)  Extended Emergency Contact Information Primary Emergency Contact: [name],[name] Mobile Phone: [phone] Relation: Nephew Secondary Emergency Contact: [name],[name] Mobile Phone: [phone] Relation: Friend  Code Status: DNR Goals of care: Advanced Directive information    01/30/2024   10:07 AM  Advanced Directives  Type of Advance Directive Healthcare Power of [place];Out of facility DNR (pink MOST or yellow form)  Does patient want to make changes to medical advance directive? No - Patient declined  Copy of Healthcare Power of Attorney in Chart? Yes - validated most recent copy scanned in chart (See row information)  Pre-existing out of facility DNR order (yellow form or pink MOST form) Pink MOST form placed in chart (order not valid for inpatient use);Yellow form placed in chart (order not valid for inpatient use)     Chief Complaint  Patient presents with   Acute Visit    Left leg pain sustained from a fall    HPI:  Pt is a 89 y.o. female seen today for an acute visit for complaining of left leg pain sustained from a mechanical fall this morning, HPOA declined ED eval.   Pending x-ray of hips/pelvis/femurs and knees  Flu A cough, nasal congestion, tested positive for flu A 03/29/2024, Tamiflu started subsequently.                          Adult failure to thrive, MOST: Comfort measures, no hospitalization, no IVF/ABT             Hx of emphysema, stable.  Edema, not apparent, off Torsemide. BNP 798 08/26/21, echo 08/27/21 EF 60-65%, weight stable.              PSVT off Amiodarone  01/2020 in hospital due to prolonged QT interval. Off Metoprolol  bid and prn-not used, Eliquis              GERD, no further c/o abd discomfort or pain, on PPI, Hgb 12.2 01/31/24              Depression, stable, off Sertraline .              CKD Bun/creat 24/1.0 01/30/24             Senile dementia since CVA, no behavioral issues. MMSE 1/30 08/06/23             11/02/19 CVA (MRI cevebral small acute infarcts in the posterior left hemisphere in the left PCA and MCA watershed area. Takes Eliquis .              Anemia, 12.2 01/31/24             Hyperlipidemia, off Atorvastatin , LDL 41 09/19/21             OA lower back, aches, ambulates with walker. Risk of falling.       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 No pertinent surgical history.  Allergies[1]  Allergies as of 03/31/2024       Reactions   Alphagan P [brimonidine Tartrate] Other (See Comments)   Unknown reaction Listed on MAR   Azopt [brinzolamide] Other (See Comments)   Unknown reaction Listed on MAR   Lumigan [bimatoprost] Other (See Comments)   Unknown reaction Listed on [facility]        Medication List        Accurate as of March 31, 2024  3:09 PM. If you have any questions, ask your nurse or doctor.          acetaminophen  325 MG tablet Commonly known as: TYLENOL  Take 650 mg by mouth every 4 (four) hours as needed for fever or moderate pain (pain score 4-6).   apixaban  2.5 MG Tabs tablet Commonly known as: ELIQUIS  Take 1 tablet (2.5 mg total) by mouth 2 (two) times daily.   lactose free nutrition Liqd Take 237 mLs by mouth 2 (two) times daily between meals.   LORazepam  1 MG tablet Commonly known as: ATIVAN  Take 0.5 tablets (0.5 mg total) by mouth every 2 (two) hours as needed for anxiety. Started by: Kitrina Maurin, NP   melatonin 3 MG Tabs tablet Take 3 mg by mouth at bedtime.    metoprolol  tartrate 25 MG tablet Commonly known as: LOPRESSOR  Take 12.5 mg by mouth 2 (two) times daily. for SVT HOLD IF BP <90/60, HR<50   morphine  20 MG/ML concentrated solution Commonly known as: ROXANOL Take 0.25 mLs (5 mg total) by mouth every 2 (two) hours as needed for up to 7 days for severe pain (pain score 7-10). Started by: Zyrah Wiswell, NP   omeprazole  20 MG capsule Commonly known as: PRILOSEC Take 20 mg by mouth daily.   Therems Tabs Take 1 tablet by mouth daily in the afternoon.   timolol  0.5 % ophthalmic solution Commonly known as: TIMOPTIC  Place 1 drop into both eyes daily.        Review of Systems  Unable to perform ROS: Dementia    Immunization History  Administered Date(s) Administered   DTaP 09/05/2004   Fluad Quad(high Dose 65+) 02/09/2023   INFLUENZA, HIGH DOSE SEASONAL PF 01/06/2014, 11/08/2018   Influenza, Quadrivalent, Recombinant, Inj, Pf 01/09/2018, 01/21/2019   Influenza-Unspecified 11/07/2013, 01/14/2020, 01/26/2021, 01/31/2022, 01/14/2024   Moderna Covid-19 Vaccine  Bivalent Booster 48yrs & up 04/04/2021   Moderna SARS-COV2 Booster Vaccination 02/08/2022   Moderna Sars-Covid-2 Vaccination 05/09/2019, 06/06/2019, 02/22/2020, 09/06/2020   PFIZER(Purple Top)SARS-COV-2 Vaccination 01/23/2023   Pneumococcal Conjugate-13 01/06/2014   Pneumococcal Polysaccharide-23 09/05/2004, 11/19/2021   Pneumococcal-Unspecified 09/05/2004, 10/18/2014   Tdap 09/05/2004, 07/19/2022   Zoster Recombinant(Shingrix) 03/21/2021, 05/24/2021   Pertinent  Health Maintenance Due  Topic Date Due   Influenza Vaccine  Completed   Bone Density Scan  Completed      08/26/2021    8:30 PM 08/27/2021   10:00 AM 01/09/2022   12:36 PM 06/25/2022   12:28 PM 07/19/2022   10:41 AM  Fall Risk  Falls in the past year?   0 0 0  Was there an injury with Fall?   0  0  0   Fall Risk Category Calculator   0 0 0  Fall Risk Category (Retired)   Low     (RETIRED) Patient Fall Risk Level  High fall risk  High fall risk  Low fall risk     Patient at Risk for Falls Due to   No Fall Risks No Fall Risks No Fall Risks  Fall risk Follow  up   Falls evaluation completed  Falls evaluation completed Falls evaluation completed     Data saved with a previous flowsheet row definition   Functional Status Survey:    Vitals:   03/31/24 1347  BP: (!) 140/98  Pulse: (!) 112  Resp: 19  Temp: 97.8 F (36.6 C)  SpO2: 95%  Weight: 99 lb 6.4 oz (45.1 kg)   Body mass index is 20.77 kg/m. Physical Exam Vitals and nursing note reviewed.  Constitutional:      Comments: Fatigue, bedrest Alert, responded to her name, doesn't follow simple direction  HENT:     Head: Normocephalic and atraumatic.     Nose: Congestion present.     Mouth/Throat:     Mouth: Mucous membranes are moist.  Eyes:     Extraocular Movements: Extraocular movements intact.     Conjunctiva/sclera: Conjunctivae normal.     Pupils: Pupils are equal, round, and reactive to light.  Cardiovascular:     Rate and Rhythm: Normal rate and regular rhythm.     Heart sounds: No murmur heard. Pulmonary:     Effort: Pulmonary effort is normal.     Breath sounds: No rales.     Comments: cough Abdominal:     General: Bowel sounds are normal.     Palpations: Abdomen is soft.     Tenderness: There is no abdominal tenderness.  Musculoskeletal:        General: Tenderness present.     Cervical back: Normal range of motion and neck supple.     Right lower leg: No edema.     Left lower leg: No edema.     Comments: Left buttock/hip pain complained, palpated Able to ambulate with walker.   Skin:    General: Skin is warm and dry.     Findings: Bruising present.     Comments: Hammer toes most of her toes Yellow, thick toe nails.  Diffused bruise left leg, negative straight leg raise R+L, no decreased PROM hips/knees   Neurological:     General: No focal deficit present.     Mental Status: She is oriented to person, place, and  time. Mental status is at baseline.     Gait: Gait abnormal.     Comments: Ambulates with walker.   Psychiatric:        Mood and Affect: Mood normal.        Behavior: Behavior normal.     Comments: At baseline confusion     Labs reviewed: Recent Labs    01/30/24 0000  NA 140  K 4.4  CL 105  CO2 23*  BUN 24*  CREATININE 1.0  CALCIUM  8.2*   No results for input(s): AST, ALT, ALKPHOS, BILITOT, PROT, ALBUMIN in the last 8760 hours. Recent Labs    01/31/24 0000  WBC 9.2  HGB 12.2  HCT 39  PLT 338   Lab Results  Component Value Date   TSH 3.96 06/12/2022   Lab Results  Component Value Date   HGBA1C 5.9 (H) 11/04/2019   Lab Results  Component Value Date   CHOL 110 09/19/2021   HDL 55 09/19/2021   LDLCALC 41 09/19/2021   TRIG 56 09/19/2021   CHOLHDL 2.8 11/06/2019    Significant Diagnostic Results in last 30 days:  No results found.  Assessment/Plan: Left leg pain Sustained from a mechanical fall this morning, noted pain and bruise left leg, HPOA declined ED eval.  Pending Xray hips, pelvis, left femur/knee Morphine  5mg  q2hr prn,  Lorazepam  0.5mg  q2hs prn for comfort measures  Influenza A Flu A cough, nasal congestion, tested positive for flu A 03/29/2024, Tamiflu started subsequently.  Adult failure to thrive  MOST: Comfort measures, no hospitalization, no IVF/ABT  Paroxysmal SVT (supraventricular tachycardia) off Amiodarone  01/2020 in hospital due to prolonged QT interval. Off Metoprolol  and Eliquis  HR 110bpm upon my examination today Lorazepam , Morphine  available to her for comfort measures.   Major neurocognitive disorder [facility]) Progressing rapidly since on set of Flu A Continue supportive care, under Hospice service.   Fall Increased frailty and confusion are contributory Close supervision for safety.   Fracture of femoral neck, left, closed (HCC) 03/31/24 X-ray impacted left femoral neck fracture.  Comfort measures.     Family/  staff Communication: plan of care reviewed with the patient and charge nurse.   Labs/tests ordered:  X-ray hips, pelvis, femurs, left knee.      [1]  Allergies Allergen Reactions   Alphagan P [Brimonidine Tartrate] Other (See Comments)    Unknown reaction Listed on MAR   Azopt [Brinzolamide] Other (See Comments)    Unknown reaction Listed on MAR   Lumigan [Bimatoprost] Other (See Comments)    Unknown reaction Listed on MAR   "

## 2024-04-09 NOTE — ED Provider Notes (Signed)
 Resume care from overnight team.  DNR/DNI on hospice/palliative.  Signed out with to see you working towards placement to [facility] versus admission for comfort measures.  Patient likely actively dying.  Clinical Course as of 04/17/24 1008  Wed 2024-04-17  1002 Called to bedside by nursing staff.  Patient had sudden change in heart rhythm.  Bradycardic and then lost pulses.  No spontaneous respirations.  No heart sounds.  No pupil reflex.  Occasional PEA, but essentially asystole on the monitor.  Bedside ultrasound with cardiac standstill.  Time of death 10:01  Called nephew listed as emergency contact to inform him of her passing.  [TY]    Clinical Course User Index [TY] Neysa Caron PARAS, DO      Neysa Caron PARAS, [place] Apr 17, 2024 [phone]

## 2024-04-09 NOTE — ED Notes (Signed)
 Pt comfort care, vitals q8 hours

## 2024-04-09 NOTE — Progress Notes (Signed)
 [facility] [address] Hospice liaison note     This patient is a current hospice patient with [facility]. Please see media tab for detailed hospice report.    Liaison will continue to follow for any discharge planning needs and to coordinate continuation of hospice care.    Please don't hesitate to call with any Hospice related questions or concerns.    Thank you for the opportunity to participate in this patient's care.   Sheril Nail, LPN [facility] Liaison [phone]

## 2024-04-09 NOTE — Discharge Planning (Signed)
 RNCM consulted regarding pt transfer to [facility].  RNCM contacted Amy, RN with [facility] to evaluate for intake at [facility]. RNCM will continue to follow.  Lankford Gutzmer J. Debarah, BSN, RN, [facility]  Inpatient Care Management  Nurse Case Manager  [facility] Emergency Departments  Operative Services  [phone]

## 2024-04-09 DEATH — deceased
